# Patient Record
Sex: Male | Born: 1950
Health system: Southern US, Community
[De-identification: ages and names within clinical notes are randomized; demographics above are authoritative.]

## PROBLEM LIST (undated history)

## (undated) DIAGNOSIS — I2510 Atherosclerotic heart disease of native coronary artery without angina pectoris: Secondary | ICD-10-CM

## (undated) DIAGNOSIS — I519 Heart disease, unspecified: Secondary | ICD-10-CM

## (undated) DIAGNOSIS — F419 Anxiety disorder, unspecified: Secondary | ICD-10-CM

## (undated) DIAGNOSIS — K5790 Diverticulosis of intestine, part unspecified, without perforation or abscess without bleeding: Secondary | ICD-10-CM

## (undated) DIAGNOSIS — H9190 Unspecified hearing loss, unspecified ear: Secondary | ICD-10-CM

## (undated) DIAGNOSIS — M1990 Unspecified osteoarthritis, unspecified site: Secondary | ICD-10-CM

## (undated) DIAGNOSIS — I10 Essential (primary) hypertension: Secondary | ICD-10-CM

## (undated) DIAGNOSIS — E785 Hyperlipidemia, unspecified: Secondary | ICD-10-CM

## (undated) DIAGNOSIS — F32A Depression, unspecified: Secondary | ICD-10-CM

## (undated) DIAGNOSIS — E119 Type 2 diabetes mellitus without complications: Secondary | ICD-10-CM

## (undated) DIAGNOSIS — Z860100 Personal history of colon polyps, unspecified: Secondary | ICD-10-CM

## (undated) DIAGNOSIS — T7840XA Allergy, unspecified, initial encounter: Secondary | ICD-10-CM

## (undated) DIAGNOSIS — I219 Acute myocardial infarction, unspecified: Secondary | ICD-10-CM

## (undated) DIAGNOSIS — F329 Major depressive disorder, single episode, unspecified: Secondary | ICD-10-CM

## (undated) HISTORY — DX: Personal history of colonic polyps: Z86.010

## (undated) HISTORY — DX: Major depressive disorder, single episode, unspecified: F32.9

## (undated) HISTORY — DX: Essential (primary) hypertension: I10

## (undated) HISTORY — DX: Diverticulosis of intestine, part unspecified, without perforation or abscess without bleeding: K57.90

## (undated) HISTORY — PX: CORONARY ANGIOPLASTY WITH STENT PLACEMENT: SHX49

## (undated) HISTORY — DX: Personal history of colon polyps, unspecified: Z86.0100

## (undated) HISTORY — DX: Allergy, unspecified, initial encounter: T78.40XA

## (undated) HISTORY — DX: Unspecified osteoarthritis, unspecified site: M19.90

## (undated) HISTORY — DX: Hyperlipidemia, unspecified: E78.5

## (undated) HISTORY — DX: Atherosclerotic heart disease of native coronary artery without angina pectoris: I25.10

## (undated) HISTORY — DX: Heart disease, unspecified: I51.9

## (undated) HISTORY — PX: ANGIOPLASTY: SHX39

## (undated) HISTORY — DX: Depression, unspecified: F32.A

---

## 1984-10-08 HISTORY — PX: ANKLE SURGERY: SHX546

## 2006-02-16 ENCOUNTER — Ambulatory Visit: Payer: Self-pay | Admitting: *Deleted

## 2006-02-16 ENCOUNTER — Inpatient Hospital Stay (HOSPITAL_COMMUNITY): Admission: EM | Admit: 2006-02-16 | Discharge: 2006-02-21 | Payer: Self-pay | Admitting: Emergency Medicine

## 2006-02-16 IMAGING — CR DG CHEST 1V PORT
1 series · 1 of 1 positions shown · non-contrast
Comparison: none

HISTORY: Chest pain, dyspnea

PORTABLE CHEST ONE VIEW:
Normal heart size and mediastinal contours for technique.
Slight vascular congestion.
No gross infiltrate or effusion.
Density right upper lobe probably represents prominent first costochondral
junction.
No pneumothorax.

[view not recorded]
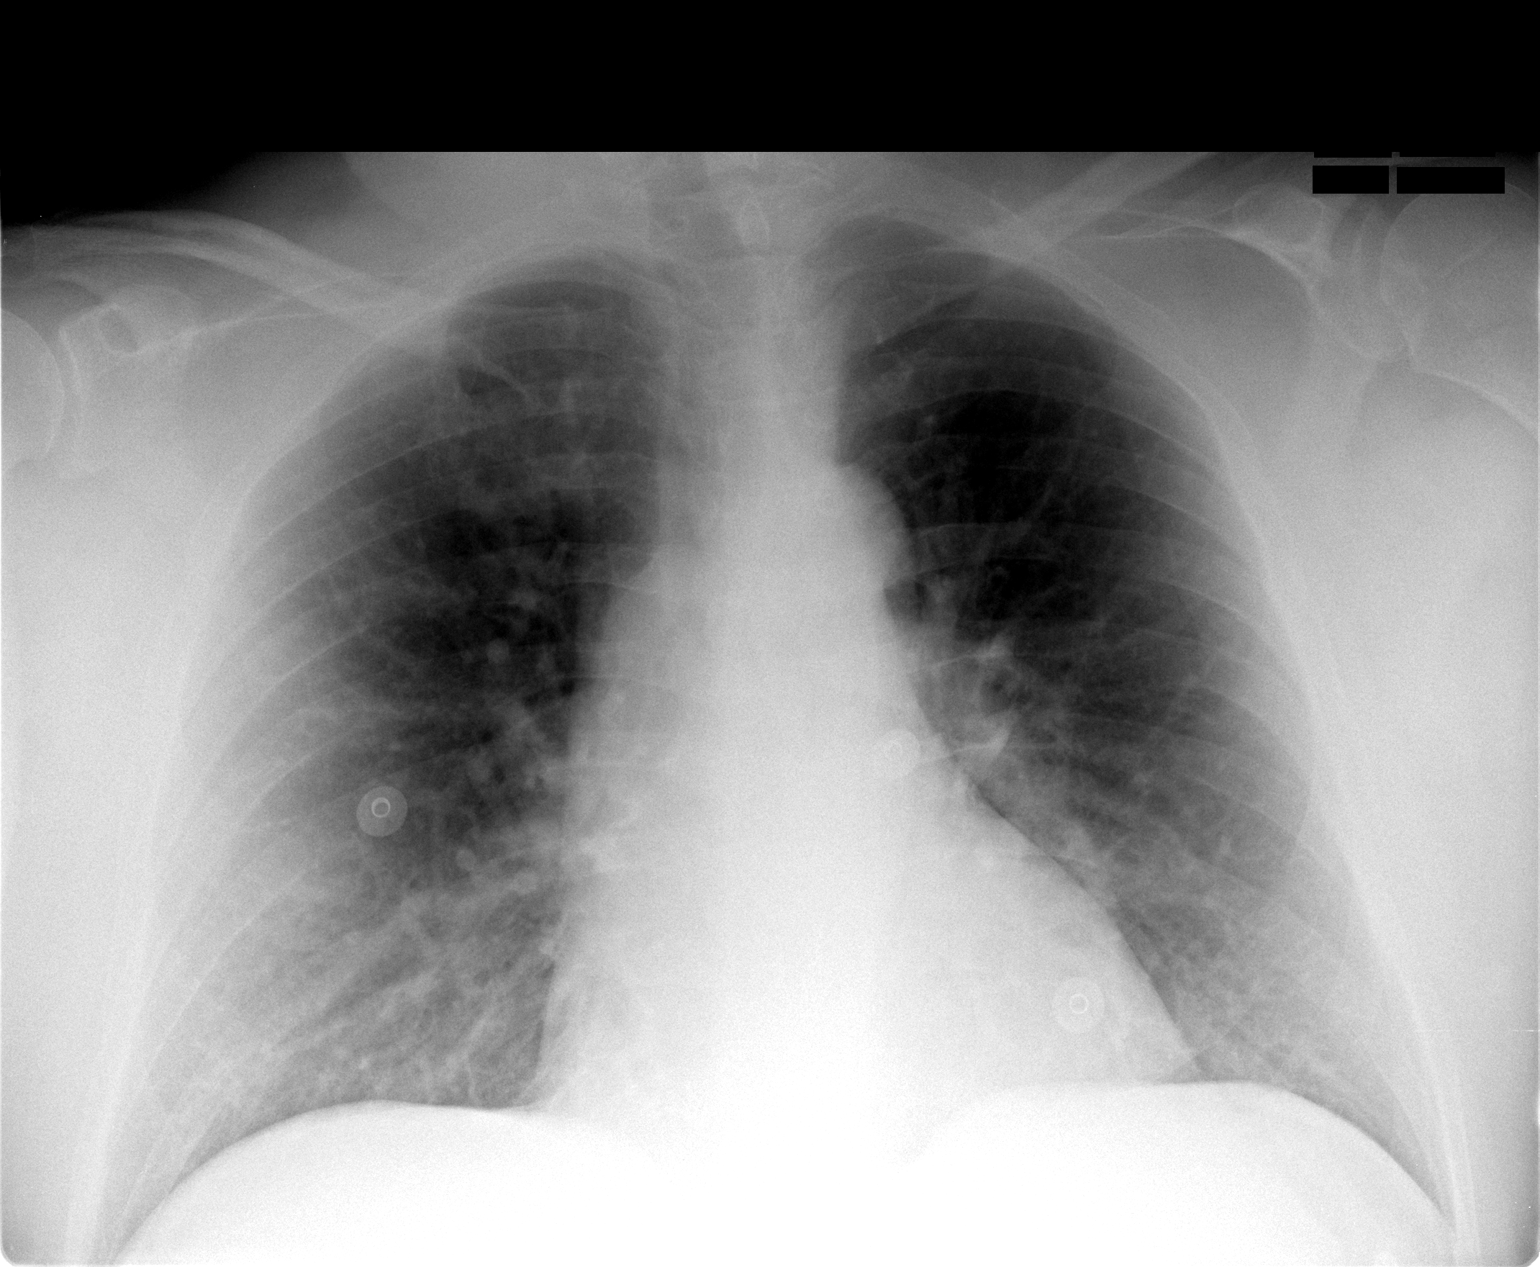

[1 of 1 positions shown; findings below may reference images not displayed]

IMPRESSION: No definite acute abnormalities.
Probable prominent right first costochondral junction accounting for right upper
lobe density, recommend followup upright PA and lateral chest radiographs.

## 2006-02-19 ENCOUNTER — Encounter: Payer: Self-pay | Admitting: Cardiology

## 2006-02-25 ENCOUNTER — Ambulatory Visit: Payer: Self-pay | Admitting: Internal Medicine

## 2006-03-01 ENCOUNTER — Ambulatory Visit: Payer: Self-pay

## 2006-03-01 ENCOUNTER — Ambulatory Visit: Payer: Self-pay | Admitting: Cardiology

## 2006-03-08 ENCOUNTER — Ambulatory Visit: Payer: Self-pay | Admitting: Cardiology

## 2006-03-15 ENCOUNTER — Ambulatory Visit: Payer: Self-pay | Admitting: Cardiology

## 2006-03-19 ENCOUNTER — Ambulatory Visit: Payer: Self-pay | Admitting: Cardiology

## 2006-03-22 ENCOUNTER — Ambulatory Visit: Payer: Self-pay | Admitting: Cardiology

## 2006-04-01 ENCOUNTER — Ambulatory Visit: Payer: Self-pay | Admitting: Cardiology

## 2006-04-08 ENCOUNTER — Ambulatory Visit: Payer: Self-pay | Admitting: Cardiology

## 2006-04-08 ENCOUNTER — Ambulatory Visit: Payer: Self-pay | Admitting: Cardiovascular Disease

## 2006-04-22 ENCOUNTER — Ambulatory Visit: Payer: Self-pay | Admitting: Cardiology

## 2006-05-07 ENCOUNTER — Ambulatory Visit: Payer: Self-pay | Admitting: Cardiology

## 2006-05-22 ENCOUNTER — Ambulatory Visit: Payer: Self-pay | Admitting: *Deleted

## 2006-06-19 ENCOUNTER — Ambulatory Visit: Payer: Self-pay | Admitting: Internal Medicine

## 2006-07-15 ENCOUNTER — Ambulatory Visit: Payer: Self-pay | Admitting: Cardiovascular Disease

## 2006-07-19 ENCOUNTER — Ambulatory Visit: Payer: Self-pay | Admitting: Cardiology

## 2006-08-12 ENCOUNTER — Ambulatory Visit: Payer: Self-pay | Admitting: Internal Medicine

## 2006-08-26 ENCOUNTER — Ambulatory Visit: Payer: Self-pay | Admitting: Cardiology

## 2006-09-02 ENCOUNTER — Ambulatory Visit: Payer: Self-pay

## 2006-09-02 ENCOUNTER — Encounter: Payer: Self-pay | Admitting: Cardiology

## 2006-11-13 ENCOUNTER — Ambulatory Visit: Payer: Self-pay | Admitting: Cardiology

## 2006-11-13 ENCOUNTER — Observation Stay (HOSPITAL_COMMUNITY): Admission: EM | Admit: 2006-11-13 | Discharge: 2006-11-14 | Payer: Self-pay | Admitting: Emergency Medicine
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
## 2006-12-02 ENCOUNTER — Ambulatory Visit: Payer: Self-pay | Admitting: Cardiology

## 2007-01-13 ENCOUNTER — Ambulatory Visit: Payer: Self-pay | Admitting: Gastroenterology

## 2007-02-28 ENCOUNTER — Ambulatory Visit: Payer: Self-pay | Admitting: Gastroenterology

## 2007-02-28 ENCOUNTER — Encounter: Payer: Self-pay | Admitting: Gastroenterology

## 2007-03-10 ENCOUNTER — Ambulatory Visit: Payer: Self-pay | Admitting: Gastroenterology

## 2007-03-10 ENCOUNTER — Ambulatory Visit (HOSPITAL_COMMUNITY): Admission: RE | Admit: 2007-03-10 | Discharge: 2007-03-10 | Payer: Self-pay | Admitting: Gastroenterology
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
## 2007-03-20 ENCOUNTER — Ambulatory Visit: Payer: Self-pay | Admitting: Cardiology

## 2007-06-02 ENCOUNTER — Ambulatory Visit: Payer: Self-pay | Admitting: Cardiology

## 2007-07-14 ENCOUNTER — Ambulatory Visit: Payer: Self-pay | Admitting: Cardiology

## 2007-10-06 ENCOUNTER — Ambulatory Visit: Payer: Self-pay | Admitting: Cardiology

## 2007-11-03 ENCOUNTER — Ambulatory Visit: Payer: Self-pay | Admitting: Cardiology

## 2007-12-29 ENCOUNTER — Emergency Department (HOSPITAL_COMMUNITY): Admission: EM | Admit: 2007-12-29 | Discharge: 2007-12-29 | Payer: Self-pay | Admitting: Emergency Medicine
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
## 2008-02-04 ENCOUNTER — Ambulatory Visit: Payer: Self-pay | Admitting: Cardiology

## 2008-02-24 ENCOUNTER — Ambulatory Visit: Payer: Self-pay | Admitting: Cardiology

## 2008-02-24 LAB — CONVERTED CEMR LAB
BUN: 16 mg/dL (ref 6–23)
CO2: 29 meq/L (ref 19–32)
Calcium: 9.8 mg/dL (ref 8.4–10.5)
Chloride: 103 meq/L (ref 96–112)
Creatinine, Ser: 0.9 mg/dL (ref 0.4–1.5)
GFR calc Af Amer: 112 mL/min
GFR calc non Af Amer: 93 mL/min
Glucose, Bld: 96 mg/dL (ref 70–99)
Potassium: 4.6 meq/L (ref 3.5–5.1)
Sodium: 139 meq/L (ref 135–145)

## 2008-04-12 ENCOUNTER — Ambulatory Visit: Payer: Self-pay | Admitting: Cardiology

## 2008-04-12 ENCOUNTER — Ambulatory Visit: Payer: Self-pay

## 2008-04-12 ENCOUNTER — Encounter: Payer: Self-pay | Admitting: Cardiology

## 2008-04-14 ENCOUNTER — Ambulatory Visit: Payer: Self-pay

## 2008-04-14 ENCOUNTER — Ambulatory Visit: Payer: Self-pay | Admitting: Cardiology

## 2008-04-29 ENCOUNTER — Ambulatory Visit: Payer: Self-pay | Admitting: Cardiology

## 2008-06-07 ENCOUNTER — Ambulatory Visit: Payer: Self-pay | Admitting: Cardiology

## 2008-06-21 ENCOUNTER — Ambulatory Visit: Payer: Self-pay

## 2008-09-20 ENCOUNTER — Ambulatory Visit: Payer: Self-pay | Admitting: Vascular Surgery

## 2008-10-11 ENCOUNTER — Ambulatory Visit: Payer: Self-pay | Admitting: Cardiology

## 2008-11-29 ENCOUNTER — Ambulatory Visit: Payer: Self-pay | Admitting: Cardiology

## 2009-01-31 DIAGNOSIS — I2510 Atherosclerotic heart disease of native coronary artery without angina pectoris: Secondary | ICD-10-CM | POA: Insufficient documentation

## 2009-01-31 DIAGNOSIS — K5730 Diverticulosis of large intestine without perforation or abscess without bleeding: Secondary | ICD-10-CM | POA: Insufficient documentation

## 2009-01-31 DIAGNOSIS — E785 Hyperlipidemia, unspecified: Secondary | ICD-10-CM | POA: Insufficient documentation

## 2009-01-31 DIAGNOSIS — K649 Unspecified hemorrhoids: Secondary | ICD-10-CM | POA: Insufficient documentation

## 2009-01-31 DIAGNOSIS — I10 Essential (primary) hypertension: Secondary | ICD-10-CM | POA: Insufficient documentation

## 2009-01-31 DIAGNOSIS — D126 Benign neoplasm of colon, unspecified: Secondary | ICD-10-CM | POA: Insufficient documentation

## 2009-02-16 ENCOUNTER — Ambulatory Visit: Payer: Self-pay | Admitting: Cardiology

## 2009-03-28 ENCOUNTER — Ambulatory Visit: Payer: Self-pay | Admitting: Cardiology

## 2009-06-14 ENCOUNTER — Ambulatory Visit: Payer: Self-pay | Admitting: Cardiology

## 2009-06-20 ENCOUNTER — Ambulatory Visit: Payer: Self-pay | Admitting: Cardiology

## 2009-06-20 DIAGNOSIS — I2510 Atherosclerotic heart disease of native coronary artery without angina pectoris: Secondary | ICD-10-CM | POA: Insufficient documentation

## 2009-06-21 ENCOUNTER — Telehealth (INDEPENDENT_AMBULATORY_CARE_PROVIDER_SITE_OTHER): Payer: Self-pay

## 2009-06-22 ENCOUNTER — Encounter: Payer: Self-pay | Admitting: Cardiology

## 2009-06-22 ENCOUNTER — Ambulatory Visit: Payer: Self-pay

## 2009-06-24 LAB — CONVERTED CEMR LAB
BUN: 17 mg/dL (ref 6–23)
Basophils Absolute: 0.1 10*3/uL (ref 0.0–0.1)
Basophils Relative: 0.7 % (ref 0.0–3.0)
CO2: 27 meq/L (ref 19–32)
Calcium: 9.4 mg/dL (ref 8.4–10.5)
Chloride: 105 meq/L (ref 96–112)
Creatinine, Ser: 0.9 mg/dL (ref 0.4–1.5)
Eosinophils Absolute: 0.2 10*3/uL (ref 0.0–0.7)
Eosinophils Relative: 2.3 % (ref 0.0–5.0)
GFR calc non Af Amer: 92.2 mL/min (ref >60)
Glucose, Bld: 128 mg/dL — ABNORMAL HIGH (ref 70–99)
HCT: 41.9 % (ref 39.0–52.0)
Hemoglobin: 14.4 g/dL (ref 13.0–17.0)
Lymphocytes Relative: 24.3 % (ref 12.0–46.0)
Lymphs Abs: 2 10*3/uL (ref 0.7–4.0)
MCHC: 34.3 g/dL (ref 30.0–36.0)
MCV: 89.1 fL (ref 78.0–100.0)
Monocytes Absolute: 0.9 10*3/uL (ref 0.1–1.0)
Monocytes Relative: 11 % (ref 3.0–12.0)
Neutro Abs: 4.9 10*3/uL (ref 1.4–7.7)
Neutrophils Relative %: 61.7 % (ref 43.0–77.0)
Platelets: 221 10*3/uL (ref 150.0–400.0)
Potassium: 4.6 meq/L (ref 3.5–5.1)
RBC: 4.71 M/uL (ref 4.22–5.81)
RDW: 13.2 % (ref 11.5–14.6)
Sodium: 138 meq/L (ref 135–145)
WBC: 8.1 10*3/uL (ref 4.5–10.5)

## 2009-07-06 ENCOUNTER — Ambulatory Visit: Payer: Self-pay | Admitting: Cardiology

## 2009-09-06 ENCOUNTER — Ambulatory Visit: Payer: Self-pay | Admitting: Cardiology

## 2009-09-09 ENCOUNTER — Telehealth (INDEPENDENT_AMBULATORY_CARE_PROVIDER_SITE_OTHER): Payer: Self-pay | Admitting: Physician Assistant

## 2009-09-09 ENCOUNTER — Encounter (INDEPENDENT_AMBULATORY_CARE_PROVIDER_SITE_OTHER): Payer: Self-pay | Admitting: *Deleted

## 2009-10-19 ENCOUNTER — Ambulatory Visit: Payer: Self-pay | Admitting: Cardiology

## 2009-12-19 ENCOUNTER — Ambulatory Visit: Payer: Self-pay | Admitting: Cardiology

## 2010-01-02 ENCOUNTER — Encounter: Admission: RE | Admit: 2010-01-02 | Discharge: 2010-01-02 | Payer: Self-pay | Admitting: Internal Medicine
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
## 2010-02-27 ENCOUNTER — Ambulatory Visit: Payer: Self-pay | Admitting: Cardiology

## 2010-03-13 ENCOUNTER — Ambulatory Visit: Payer: Self-pay | Admitting: Cardiology

## 2010-03-13 DIAGNOSIS — R7309 Other abnormal glucose: Secondary | ICD-10-CM | POA: Insufficient documentation

## 2010-03-13 DIAGNOSIS — R609 Edema, unspecified: Secondary | ICD-10-CM | POA: Insufficient documentation

## 2010-03-17 LAB — CONVERTED CEMR LAB
BUN: 16 mg/dL (ref 6–23)
CO2: 32 meq/L (ref 19–32)
Calcium: 9.6 mg/dL (ref 8.4–10.5)
Chloride: 103 meq/L (ref 96–112)
Creatinine, Ser: 1.3 mg/dL (ref 0.4–1.5)
GFR calc non Af Amer: 60.16 mL/min (ref >60)
Glucose, Bld: 185 mg/dL — ABNORMAL HIGH (ref 70–99)
Magnesium: 2.2 mg/dL (ref 1.5–2.5)
Potassium: 4.3 meq/L (ref 3.5–5.1)
Sodium: 141 meq/L (ref 135–145)
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
## 2010-06-19 ENCOUNTER — Encounter: Payer: Self-pay | Admitting: Cardiology

## 2010-06-19 ENCOUNTER — Ambulatory Visit: Payer: Self-pay | Admitting: Cardiology

## 2010-06-19 ENCOUNTER — Ambulatory Visit (HOSPITAL_COMMUNITY): Admission: RE | Admit: 2010-06-19 | Discharge: 2010-06-19 | Payer: Self-pay | Admitting: Cardiology

## 2010-06-19 DIAGNOSIS — R209 Unspecified disturbances of skin sensation: Secondary | ICD-10-CM | POA: Insufficient documentation

## 2010-06-19 IMAGING — CR DG CERVICAL SPINE COMPLETE 4+V
6 series · 6 of 6 positions shown · non-contrast
Comparison: None.

CLINICAL DATA: History of right finger numbness.  No history of
injury.

CERVICAL SPINE - COMPLETE 4+ VIEW

[w c-spine lat]
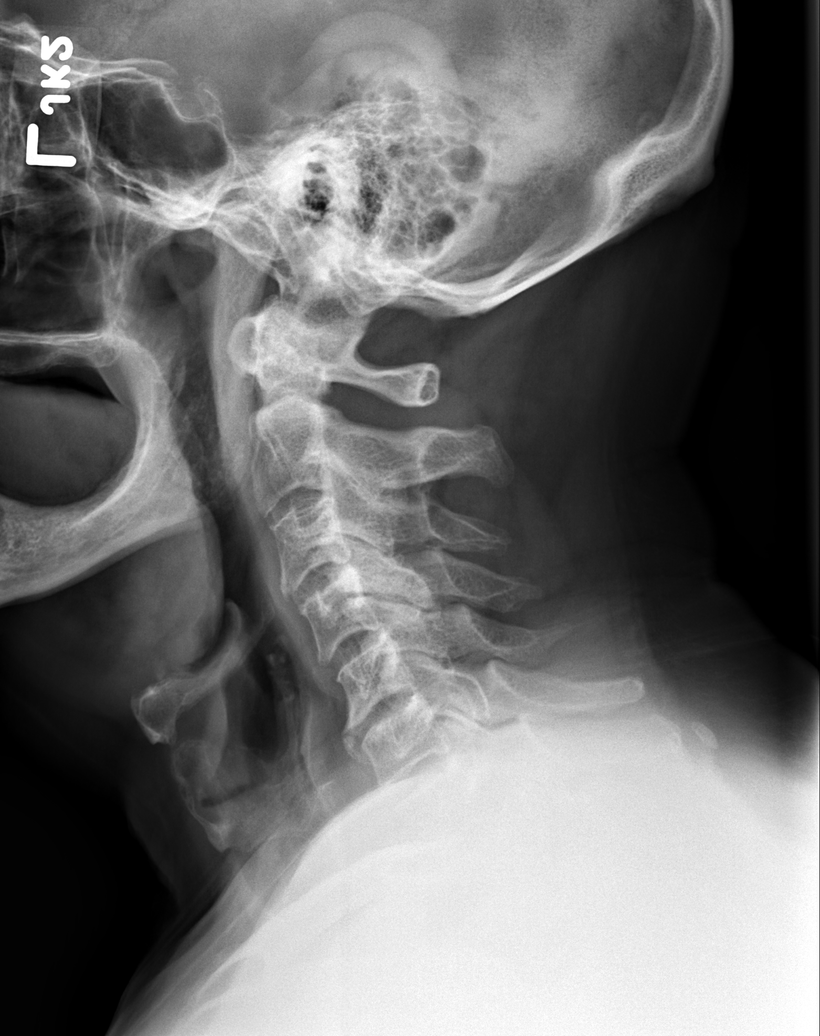

[w swimmers view *]
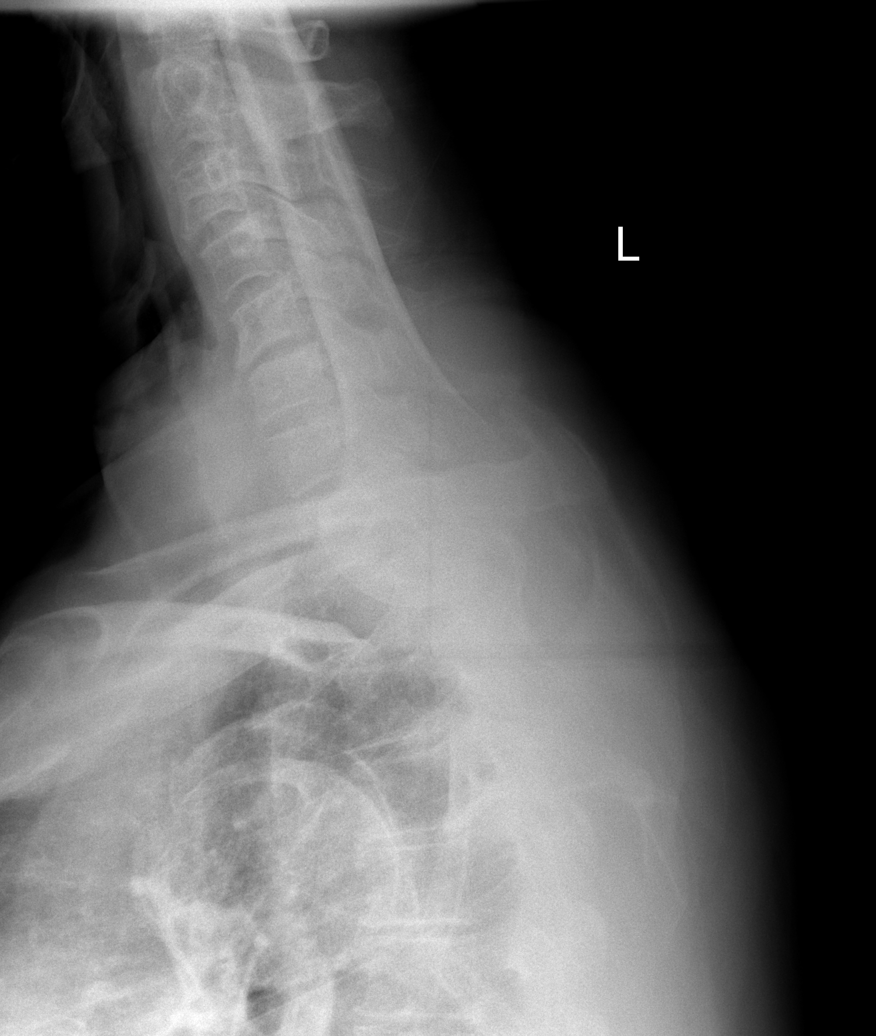

[w c-spine oblique (1 of 2)]
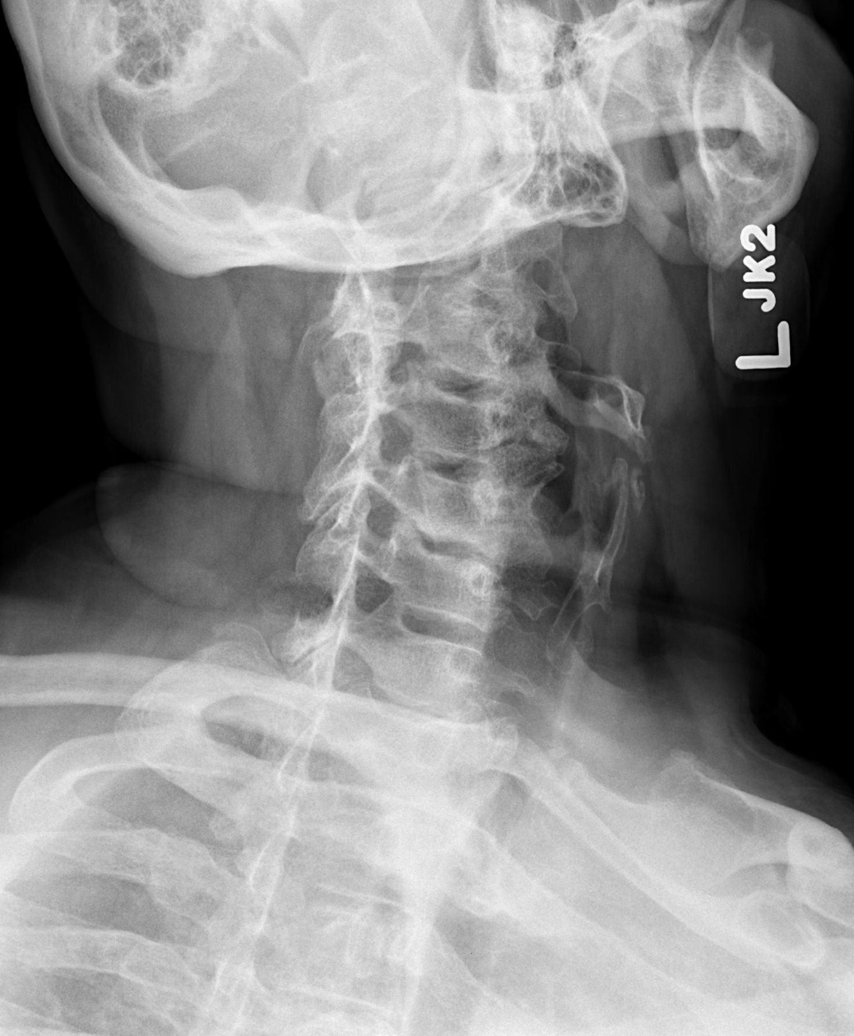

[w c-spine oblique (2 of 2)]
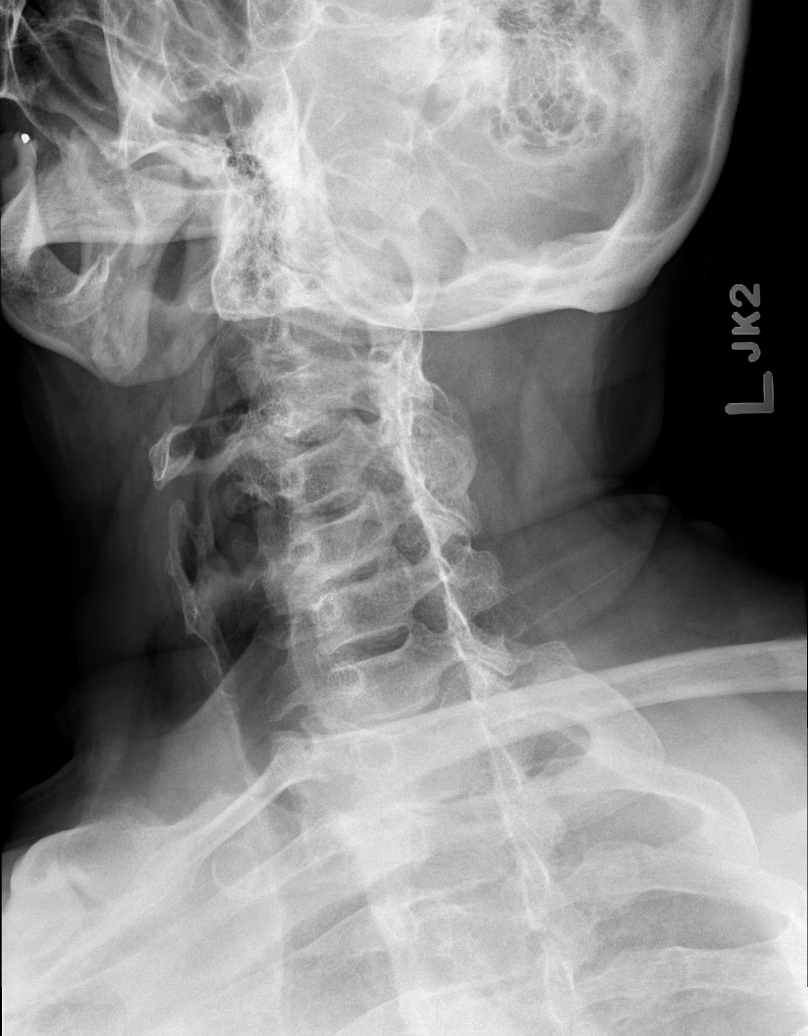

[w c-spine a.p.]
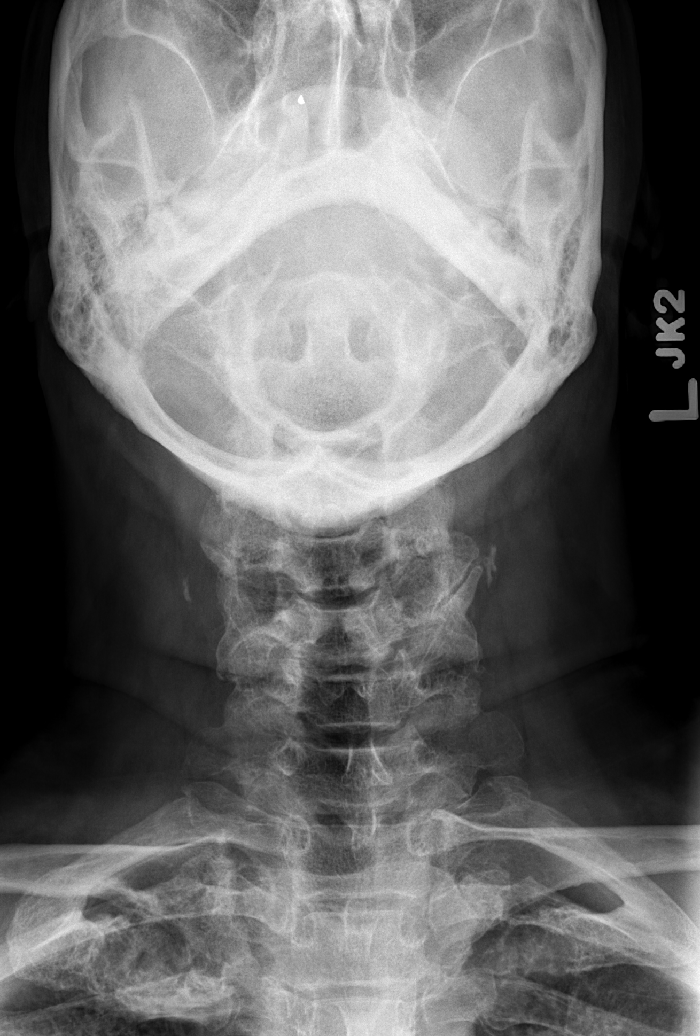

[w c-spine odontoid]
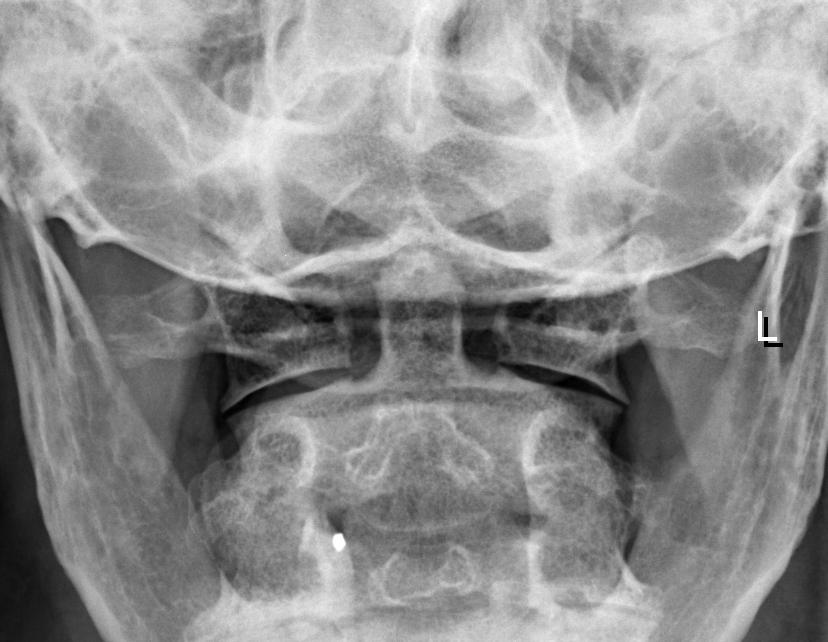

[6 of 6 positions shown; findings below may reference images not displayed]

FINDINGS: There is no evidence of prevertebral soft tissue
swelling.
There is narrowing of the intervertebral disc space at the C5-C6
level.  There is marginal osteophyte formation representing
degenerative spondylosis.  There is nuchal ligament calcification
posterior to the C7 spinous process.  The foramina appear patent.
There is slight spurring with foraminal encroachment at the level
of C3-C4 on the right.  Soft tissue calcifications in the neck
bilaterally may be vascular.  No cervical rib is evident.
IMPRESSION: There is evidence of degenerative disc disease with disc space
narrowing at C5-C6.  There is degenerative spondylosis.  There is
slight foraminal encroachment by spurring at C3-C4 on the right.
Soft tissue calcifications in the neck may be vascular.

## 2010-06-20 LAB — CONVERTED CEMR LAB
BUN: 15 mg/dL (ref 6–23)
Basophils Absolute: 0 10*3/uL (ref 0.0–0.1)
Basophils Relative: 0.4 % (ref 0.0–3.0)
CO2: 32 meq/L (ref 19–32)
Calcium: 9.7 mg/dL (ref 8.4–10.5)
Chloride: 101 meq/L (ref 96–112)
Creatinine, Ser: 1.1 mg/dL (ref 0.4–1.5)
Eosinophils Absolute: 0.2 10*3/uL (ref 0.0–0.7)
Eosinophils Relative: 2.2 % (ref 0.0–5.0)
GFR calc non Af Amer: 75.25 mL/min (ref >60)
Glucose, Bld: 187 mg/dL — ABNORMAL HIGH (ref 70–99)
HCT: 43.2 % (ref 39.0–52.0)
Hemoglobin: 14.5 g/dL (ref 13.0–17.0)
INR: 1 (ref 0.8–1.0)
Lymphocytes Relative: 25.6 % (ref 12.0–46.0)
Lymphs Abs: 2.3 10*3/uL (ref 0.7–4.0)
MCHC: 33.5 g/dL (ref 30.0–36.0)
MCV: 90.1 fL (ref 78.0–100.0)
Monocytes Absolute: 0.8 10*3/uL (ref 0.1–1.0)
Monocytes Relative: 9.1 % (ref 3.0–12.0)
Neutro Abs: 5.6 10*3/uL (ref 1.4–7.7)
Neutrophils Relative %: 62.7 % (ref 43.0–77.0)
Platelets: 269 10*3/uL (ref 150.0–400.0)
Potassium: 5 meq/L (ref 3.5–5.1)
Prothrombin Time: 10.2 s (ref 9.7–11.8)
RBC: 4.79 M/uL (ref 4.22–5.81)
RDW: 14.8 % — ABNORMAL HIGH (ref 11.5–14.6)
Sodium: 142 meq/L (ref 135–145)
WBC: 8.9 10*3/uL (ref 4.5–10.5)
aPTT: 30.4 s — ABNORMAL HIGH (ref 21.7–28.8)

## 2010-06-23 ENCOUNTER — Ambulatory Visit: Payer: Self-pay | Admitting: Cardiology

## 2010-06-23 ENCOUNTER — Inpatient Hospital Stay (HOSPITAL_BASED_OUTPATIENT_CLINIC_OR_DEPARTMENT_OTHER): Admission: RE | Admit: 2010-06-23 | Discharge: 2010-06-23 | Payer: Self-pay | Admitting: Cardiology

## 2010-06-26 ENCOUNTER — Encounter: Payer: Self-pay | Admitting: Cardiology

## 2010-06-26 ENCOUNTER — Telehealth: Payer: Self-pay | Admitting: Cardiology

## 2010-07-04 ENCOUNTER — Telehealth (INDEPENDENT_AMBULATORY_CARE_PROVIDER_SITE_OTHER): Payer: Self-pay | Admitting: *Deleted

## 2010-07-11 ENCOUNTER — Telehealth (INDEPENDENT_AMBULATORY_CARE_PROVIDER_SITE_OTHER): Payer: Self-pay | Admitting: *Deleted

## 2010-07-25 ENCOUNTER — Ambulatory Visit: Payer: Self-pay | Admitting: Cardiology

## 2010-07-27 ENCOUNTER — Encounter: Payer: Self-pay | Admitting: Cardiology

## 2010-09-01 ENCOUNTER — Inpatient Hospital Stay (HOSPITAL_COMMUNITY): Admission: EM | Admit: 2010-09-01 | Discharge: 2010-09-03 | Payer: Self-pay | Admitting: Emergency Medicine

## 2010-09-01 ENCOUNTER — Ambulatory Visit: Payer: Self-pay | Admitting: Cardiology
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
## 2010-10-16 ENCOUNTER — Ambulatory Visit
Admission: RE | Admit: 2010-10-16 | Discharge: 2010-10-16 | Payer: Self-pay | Source: Home / Self Care | Attending: Cardiology | Admitting: Cardiology

## 2010-11-07 NOTE — Progress Notes (Signed)
  Walk in Patient Form Recieved " Pt needs Healthcare Provider forms completed' sent to [facility]  July 04, 2010 4:11 PM     Appended Document:  Received request for document from [employer] to be completed. Request forwarded to [employer].

## 2010-11-07 NOTE — Procedures (Signed)
Summary: Gastroenterology COLON  Gastroenterology COLON   Imported By: Thereasa Solo 03/20/2008 12:24:22  _____________________________________________________________________  External Attachment:    Type:   Image     Comment:   External Document

## 2010-11-07 NOTE — Cardiovascular Report (Signed)
Summary: Pre-Cath Orders  Pre-Cath Orders   Imported By: Marylou Mccoy 07/04/2010 11:24:50  _____________________________________________________________________  External Attachment:    Type:   Image     Comment:   External Document

## 2010-11-07 NOTE — Assessment & Plan Note (Signed)
Summary: Cardiology Nuclear Study  Nuclear Med Background Indications for Stress Test: Evaluation for Ischemia, Stent Patency, PTCA Patency   History: Angioplasty, Echo, GXT, Heart Catheterization, Myocardial Infarction, Myocardial Perfusion Study, Stents  History Comments: 5/07: MI>PTCA/Stent-LAD; 11/07 MPS: Apical scar, no ischemia; '08 GXT: No significant ST changes, unable to achieve [facility] 7/09 Echo: EF=65%  Symptoms: Chest Pain, Chest Pain with Exertion, Chest Pressure, Chest Pressure with Exertion, DOE, Fatigue, Rapid HR    Nuclear Pre-Procedure Cardiac Risk Factors: Family History - CAD, History of Smoking, Hypertension, Lipids, Obesity Caffeine/Decaff Intake: none NPO After: 10:30 PM Lungs: Clear IV 0.9% NS with Angio Cath: 20g     IV Site: (R) AC IV Started by: Stanton Kidney EMT-P Chest Size (in) 50     Height (in): 69 Weight (lb): 280 BMI: 41.50  Nuclear Med Study 1 or 2 day study:  1 day     Stress Test Type:  Adenosine Reading MD:  Olga Millers, MD     Referring MD:  Shawnie Pons, MD Resting Radionuclide:  Technetium 28m Tetrofosmin     Resting Radionuclide Dose:  11 mCi  Stress Radionuclide:  Technetium 45m Tetrofosmin     Stress Radionuclide Dose:  33 mCi   Stress Protocol      Max HR:  81 bpm Max Systolic BP: 82 mm HgRate Pressure Product:  6642 Dose of Adenosine:  60.0 mg    Stress Test Technologist:  Rea [employer] CMA-N     Nuclear Technologist:  Domenic Polite CNMT  Rest Procedure  Myocardial perfusion imaging was performed at rest 45 minutes following the intraveneous administration of Myoview Technetium 36m Tetrofosmin.  Stress Procedure  The patient received IV adenosine at 140 mcg/kg/min for 4 minutes. There was a brief episode of 2nd degree AVB with infusion. He did c/o chest pressure.  Myoview was injected at the 2 minute mark and quantitative spect images were obtained after a 45 minute delay.  QPS Raw Data Images:  There is no interference from  other nuclear activity. Stress Images:  There is decreased uptake in the inferior wall and apex. Rest Images:  There is decreased uptake in the inferior wall and apex; inferior defect slightly less prominent compared to the stress images. Subtraction (SDS):  These findings are consistent with prior inferior and apical infarct; minimal peri-infarct ischemia in the inferior wall. Transient Ischemic Dilatation:  1.12  (Normal <1.22)  Lung/Heart Ratio:  .42  (Normal <0.45)  Quantitative Gated Spect Images QGS EDV:  165 ml QGS ESV:  95 ml QGS EF:  42 % QGS cine images:  Apical akinesis; evidence of LVE. A   Overall Impression  Exercise Capacity: Adenosine study with no exercise. ECG Impression: No significant ST segment change suggestive of ischemia. Overall Impression: Abnormal adenosine nuclear study with prior inferior and apical infarct; minimal peri-infarct ischemia in the inferior wall.  Appended Document: Cardiology Nuclear Study Study reviewed in detail.  Apex and inferior findings, with overall small ischemia.  I left message on machine, but he will need a followup with me next week to review.  I discussed on phone possible repeat cath study.  Appended Document: Cardiology Nuclear Study Pt aware of results by phone.  Pt is scheduled to see Dr Riley Kill 07/06/09.

## 2010-11-07 NOTE — Letter (Signed)
Summary: Cardiac Catheterization Instructions- [facility]  [facility], Main Office  [address]   [place], [place] [address]   Phone: [phone]  Fax: [phone]     06/19/2010 MRN: [id number]  [facility] [address] [place], [place]  [address]  Dear Mr. Socorro,   You are scheduled for a Cardiac Catheterization on Friday September 16 with Dr. Riley Kill  Please arrive to the 1st floor of the Heart and Vascular Center at [facility] at 9:30 am on the day of your procedure. Please do not arrive before 6:30 a.m. Call the Heart and Vascular Center at [phone] if you are unable to make your appointmnet. The Code to get into the parking garage under the building is 0020. Take the elevators to the 1st floor. You must have someone to drive you home. Someone must be with you for the first 24 hours after you arrive home. Please wear clothes that are easy to get on and off and wear slip-on shoes. Do not eat or drink after midnight except water with your medications that morning. Bring all your medications and current insurance cards with you.   _X_ Make sure you take your aspirin.  __X_ You may take ALL of your medications with water that morning.   The usual length of stay after your procedure is 2 to 3 hours. This can vary.  If you have any questions, please call the office at the number listed above.  Dr. Maisie Fus Stuckey/ Lisabeth Devoid RN

## 2010-11-07 NOTE — Progress Notes (Signed)
  Phone Note Call from Patient   Reason for Call: Refill Medication Summary of Call: Vincent [name] has lost his nitro. The pharmacy would not refill the med. I called in Rx for nitro for him. He is not having chest pain now.  Initial call taken by: Park Breed PA-C,  September 09, 2009 5:18 PM     Appended Document:  reviewed.  And agree
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
## 2010-11-07 NOTE — Assessment & Plan Note (Signed)
Summary: EPH   Visit Type:  Follow-up Primary Provider:  shaw  CC:  Post-hospital.  History of Present Illness: Vincent [name] comes in today to review his heart cath results.  He is stable.  The procedure was performed from the radial approach, and he felt a little bit of numbness, but that all resolved.  We discussed his findings in detail, and I also spent alot of time filling out his forms so that would have these promptly which he needs for work.  His anatomy was reviewed in detail so he could see what he was looking at.  Current Medications (verified): 1)  Ramipril 2.5 Mg Caps (Ramipril) .... Take 1 Capsule By Mouth Once A Day 2)  Study Drug Improve .... As Directed 3)  Aspirin 81 Mg Tbec (Aspirin) .... Take One Tablet By Mouth Daily 4)  Metoprolol Tartrate 50 Mg Tabs (Metoprolol Tartrate) .... Take One Tablet By Mouth Twice A Day 5)  Nitroglycerin 0.4 Mg/hr Pt24 (Nitroglycerin) .... As Directed For Chest Pain 6)  Xanax Xr 0.5 Mg Xr24h-Tab (Alprazolam) .... As Needed 7)  Furosemide 40 Mg Tabs (Furosemide) .... Take 1 Tablet By Mouth Two Times A Day  Allergies: 1)  ! Penicillin  Past History:  Past Medical History: Last updated: 01/31/2009 CAD, UNSPECIFIED SITE (ICD-414.00) HYPERTENSION, UNSPECIFIED (ICD-401.9) HYPERLIPIDEMIA-MIXED (ICD-272.4) HEMORRHOIDS (ICD-455.6) DIVERTICULOSIS, MILD (ICD-562.10) COLONIC POLYPS, ADENOMATOUS (ICD-211.3) Hx of (L) ventricular dysfunction Allergies  Past Surgical History: Last updated: 03/19/2008 Status post angioplasty and bare metal stent placement  Family History: Last updated: 01/31/2009 Family History of Coronary Artery Disease:   Social History: Last updated: 01/31/2009 Full Time Married  Tobacco Use - No.  Alcohol Use - no  Vital Signs:  Patient profile:   60 year old male Height:      69 inches Weight:      275.50 pounds Pulse rate:   76 / minute Pulse rhythm:   regular Resp:     20 per minute BP sitting:   124 / 80   (left arm) Cuff size:   large  Vitals Entered By: Vikki Ports (July 25, 2010 3:25 PM)  Physical Exam  General:  Well developed, well nourished, in no acute distress. Head:  normocephalic and atraumatic Eyes:  PERRLA/EOM intact; conjunctiva and lids normal. Lungs:  Clear bilaterally to auscultation and percussion. Heart:  PMi non displaced. Normal S1 and S2.  No murmur, rub, or gallop.   Abdomen:  Obese Pulses:  pulses normal in all 4 extremities Extremities:  Radial artery looks normal.  Neurologic:  Alert and oriented x 3.   EKG  Procedure date:  06/24/2010  Findings:      ANGIOGRAPHIC DATA: 1. Ventriculography done in the RAO projection reveals hypokinesis of     the apex.  Overall ejection fraction estimated to be in the range     of 45-50%. 2. The left main is free of disease. 3. The left anterior descending artery has 30-40% segmental narrowing     in the previously placed stent site, however, high-grade in-stent     restenosis is not noted.  There is perhaps maybe 30-40% mid     narrowing at the diagonal, but no critical lesions noted. 4. The circumflex has multiple small marginals, then provides a large     bifurcating marginal.  There is perhaps 40% mid narrowing, but it     does not appear to be flow-limiting. 5. The right coronary artery is a large-caliber vessel with an acute  marginal, posterior descending, and posterolateral system.  No     areas of high-grade stenosis are noted.   CONCLUSION: 1. Mild reduction in left ventricular function with anteroapical wall     motion abnormality. 2. Continued patency of the stent to the LAD. 3. No areas of high-grade disease involving the other coronary     vasculature.   DISPOSITION:  The patient will be treated medically.  He will follow up with me in the office.         Arturo Morton. Riley Kill, MD, [facility]      EKG  Procedure date:  06/24/2010  Findings:      Non specific T wave  abnormality.  Impression & Recommendations:  Problem # 1:  CAD, UNSPECIFIED SITE (ICD-414.00) See cath report.  Findings reviewed. Patient encouraged about findings.   His updated medication list for this problem includes:    Ramipril 2.5 Mg Caps (Ramipril) .Marland Kitchen... Take 1 capsule by mouth once a day    Aspirin 81 Mg Tbec (Aspirin) .Marland Kitchen... Take one tablet by mouth daily    Metoprolol Tartrate 50 Mg Tabs (Metoprolol tartrate) .Marland Kitchen... Take one tablet by mouth twice a day    Nitroglycerin 0.4 Mg/hr Pt24 (Nitroglycerin) .Marland Kitchen... As directed for chest pain  Orders: EKG w/ Interpretation (93000)  Problem # 2:  HYPERLIPIDEMIA-MIXED (ICD-272.4) The patient remains in the IMPROVE IT  ([facility]) study protocol for hyperlipidemia.   LIpids are blinded.  Problem # 3:  FASTING HYPERGLYCEMIA (ICD-790.29) Remains an issue.   Patient Instructions: 1)  Your physician recommends that you continue on your current medications as directed. Please refer to the Current Medication list given to you today. 2)  Your physician wants you to follow-up in: 6 MONTHS.   You will receive a reminder letter in the mail two months in advance. If you don't receive a letter, please call our office to schedule the follow-up appointment.  Appended Document: EPH Can you let him know his sugar is elevated, and we want him to follow up with Dr. Clelia Croft on this.  TS  I spoke with the pt and made him aware that he needs further follow-up of his glucose.  The pt said he would discuss this with Dr Clelia Croft. I spoke with the pt about decreasing the sugar intake (carbohydrates) in his diet.   Appended Document: [facility] course reviewed.  TS

## 2010-11-07 NOTE — Assessment & Plan Note (Signed)
Summary: ROV   Visit Type:  Follow-up Primary Provider:  shaw  CC:  Some chest pains and Sob.  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  Patient not smoking.  He thinks that is too much.  Now on furosemide twice daily for his lower extremity edema.  He does fine at home.  Says he does not have sleep apnea.  He says he is now tired all of the time.  Is sleepy alot.  Works 6pm to [employer].    Current Medications (verified): 1)  Ramipril 2.5 Mg Caps (Ramipril) .... Take 1 Capsule By Mouth Once A Day 2)  Study Drug Improve .... As Directed 3)  Aspirin 81 Mg Tbec (Aspirin) .... Take One Tablet By Mouth Daily 4)  Metoprolol Tartrate 50 Mg Tabs (Metoprolol Tartrate) .... Take One Tablet By Mouth Twice A Day 5)  Nitroglycerin 0.4 Mg/hr Pt24 (Nitroglycerin) .... As Directed For Chest Pain 6)  Xanax Xr 0.5 Mg Xr24h-Tab (Alprazolam) .... As Needed 7)  Furosemide 40 Mg Tabs (Furosemide) .... Take 1 Tablet By Mouth Two Times A Day  Allergies: 1)  ! Penicillin  Vital Signs:  Patient profile:   60 year old male Height:      69 inches Weight:      285 pounds BMI:     42.24 Pulse rate:   84 / minute Pulse rhythm:   regular Resp:     18 per minute BP sitting:   116 / 70  (left arm) Cuff size:   large  Vitals Entered By: Vikki Ports (March 13, 2010 10:33 AM)  Physical Exam  General:  Well developed, well nourished, in no acute distress.  Beard Head:  normocephalic and atraumatic Eyes:  PERRLA/EOM intact; conjunctiva and lids normal. Lungs:  Clear bilaterally to auscultation and percussion. Heart:  PMI non displaced.  Normal S1 and S2.  Pos S4.   Extremities:  No clubbing or cyanosis.  Two plus edema bilaterally Neurologic:  Alert and oriented x 3.   EKG  Procedure date:  03/13/2010  Findings:      NSR.  DIffuse T flattening.  Impression & Recommendations:  Problem # 1:  CORONARY ATHEROSCLEROSIS NATIVE CORONARY ARTERY  (ICD-414.01)  Has non predictable pain.  Sometimes with very high work load.  Sometimes when anxious.  No predictable pattern.    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  Will hold off on cath at present time.  See nuc report. His updated medication list for this problem includes:    Ramipril 2.5 Mg Caps (Ramipril) .Marland Kitchen... Take 1 capsule by mouth once a day    Aspirin 81 Mg Tbec (Aspirin) .Marland Kitchen... Take one tablet by mouth daily    Metoprolol Tartrate 50 Mg Tabs (Metoprolol tartrate) .Marland Kitchen... Take one tablet by mouth twice a day    Nitroglycerin 0.4 Mg/hr Pt24 (Nitroglycerin) .Marland Kitchen... As directed for chest pain  Orders: TLB-BMP (Basic Metabolic Panel-BMET) (80048-METABOL) TLB-Magnesium (Mg) (83735-MG) EKG w/ Interpretation (93000)  Problem # 2:  HYPERTENSION, UNSPECIFIED (ICD-401.9)  controlled. His updated medication list for this problem includes:    Ramipril 2.5 Mg Caps (Ramipril) .Marland Kitchen... Take 1 capsule by mouth once a day    Aspirin 81 Mg Tbec (Aspirin) .Marland Kitchen... Take one tablet by mouth daily    Metoprolol Tartrate 50 Mg Tabs (Metoprolol tartrate) .Marland Kitchen... Take one tablet by mouth twice a day  Furosemide 40 Mg Tabs (Furosemide) .Marland Kitchen... Take 1 tablet by mouth two times a day  Orders: TLB-BMP (Basic Metabolic Panel-BMET) (80048-METABOL) TLB-Magnesium (Mg) (83735-MG) EKG w/ Interpretation (93000)  Problem # 3:  EDEMA (ICD-782.3)  On furosemide.  Need to check K and MG.  Will send to Dr. Clelia Croft when available.    Orders: TLB-BMP (Basic Metabolic Panel-BMET) (80048-METABOL) TLB-Magnesium (Mg) (83735-MG) EKG w/ Interpretation (93000)  Problem # 4:  HYPERLIPIDEMIA-MIXED (ICD-272.4)  In the IMPROVE IT study at [facility].   Orders: TLB-BMP (Basic Metabolic Panel-BMET) (80048-METABOL) TLB-Magnesium (Mg) (83735-MG) EKG w/ Interpretation (93000)  Problem # 5:  FASTING HYPERGLYCEMIA (ICD-790.29) discussed elevated sugar from research labs.  Glucose 157 on fasting  specimen.  Will forward to Dr. Clelia Croft.   Patient Instructions: 1)  Your physician recommends that you continue on your current medications as directed. Please refer to the Current Medication list given to you today. 2)  Your physician recommends that you have lab work today: BMP and Magnesium 3)  Your physician recommends that you schedule a follow-up appointment in: 3 MONTHS

## 2010-11-07 NOTE — Assessment & Plan Note (Signed)
Summary: f89m   Visit Type:  3 mo f/u Primary Provider:  shaw  CC:  chest pain...sob....denies edema.  History of Present Illness: He says the pain feels like when you swallow something and you cannot get it down.  He also gets numbness through the right hand, in various distributions.  He will get chest pain when he does heavy lifting at work.  The pain has not gotten worse, but it can be a hard pain.    Current Medications (verified): 1)  Ramipril 2.5 Mg Caps (Ramipril) .... Take 1 Capsule By Mouth Once A Day 2)  Study Drug Improve .... As Directed 3)  Aspirin 81 Mg Tbec (Aspirin) .... Take One Tablet By Mouth Daily 4)  Metoprolol Tartrate 50 Mg Tabs (Metoprolol Tartrate) .... Take One Tablet By Mouth Twice A Day 5)  Nitroglycerin 0.4 Mg/hr Pt24 (Nitroglycerin) .... As Directed For Chest Pain 6)  Xanax Xr 0.5 Mg Xr24h-Tab (Alprazolam) .... As Needed 7)  Furosemide 40 Mg Tabs (Furosemide) .... Take 1 Tablet By Mouth Two Times A Day  Allergies: 1)  ! Penicillin  Past History:  Past Medical History: Last updated: 01/31/2009 CAD, UNSPECIFIED SITE (ICD-414.00) HYPERTENSION, UNSPECIFIED (ICD-401.9) HYPERLIPIDEMIA-MIXED (ICD-272.4) HEMORRHOIDS (ICD-455.6) DIVERTICULOSIS, MILD (ICD-562.10) COLONIC POLYPS, ADENOMATOUS (ICD-211.3) Hx of (L) ventricular dysfunction Allergies  Past Surgical History: Last updated: 03/19/2008 Status post angioplasty and bare metal stent placement  Family History: Last updated: 01/31/2009 Family History of Coronary Artery Disease:   Social History: Last updated: 01/31/2009 Full Time Married  Tobacco Use - No.  Alcohol Use - no  Vital Signs:  Patient profile:   60 year old male Height:      69 inches Weight:      278.8 pounds BMI:     41.32 Pulse rate:   74 / minute Pulse rhythm:   irregular BP sitting:   122 / 70  (left arm) Cuff size:   large  Vitals Entered By: Danielle Rankin, CMA (June 19, 2010 11:10 AM)  Physical  Exam  General:  Well developed, well nourished, in no acute distress. Head:  normocephalic and atraumatic Eyes:  PERRLA/EOM intact; conjunctiva and lids normal. Ears:  TM's intact and clear with normal canals and hearing Lungs:  Clear bilaterally to auscultation and percussion. Heart:  PMI non displaced.  Normal S1 and S2.   Abdomen:  Obese.  No obvious masses.  Pulses:  pulses normal in all 4 extremities Extremities:  No clubbing or cyanosis. Neurologic:  Alert and oriented x 3.  No definite weakness bilaterally.    Cardiac Cath  Procedure date:  02/16/2006  Findings:      ANGIOGRAPHIC DATA:  1.  Ventriculography was done in the RAO projection. There was hypokinesis      of the mid anterolateral wall and what appears to be akinesis to even      dyskinesis of the apex and distal inferior segment. We estimated the      ejection fraction at 40%. Contrast ventriculography was not calculated      because of incomplete dye opacification.  2.  The left main coronary artery is free of critical disease.  3.  The LAD is totally occluded. Following re-perfusion, the total occlusion      was reduced from 100% down to 0% with an excellent angiographic result.      The distal LAD had some mild luminal irregularity with perhaps 30% after      the major diagonal.  4.  The circumflex is  a moderate size vessel with about 30% proximal      narrowing.  5.  The right coronary artery is a large caliber vessel providing posterior      descending and posterolateral branch with about 30% narrowing.   CONCLUSION:  1.  Moderate wall motion abnormality involving the left anterior descending      territory.  2.  Total occlusion of the proximal left anterior descending with successful      re-perfusion using direct primary angioplasty.  3.  Other scattered luminal irregularities as noted above.   DISPOSITION:  The patient will be treated medically. He will need aggressive  risk factor  reduction.   EKG  Procedure date:  06/19/2010  Findings:      NSR.  Low voltage QRS.    Impression & Recommendations:  Problem # 1:  CORONARY ATHEROSCLEROSIS NATIVE CORONARY ARTERY (ICD-414.01) Has recurrent chest pain.  It occurs at all times.  Would favor diagnostic cath, as given size doubt nuclear study would be reliable.  Need to separate given type and extent of work.  would also favor cervical spine films.  His updated medication list for this problem includes:    Ramipril 2.5 Mg Caps (Ramipril) .Marland Kitchen... Take 1 capsule by mouth once a day    Aspirin 81 Mg Tbec (Aspirin) .Marland Kitchen... Take one tablet by mouth daily    Metoprolol Tartrate 50 Mg Tabs (Metoprolol tartrate) .Marland Kitchen... Take one tablet by mouth twice a day    Nitroglycerin 0.4 Mg/hr Pt24 (Nitroglycerin) .Marland Kitchen... As directed for chest pain  Problem # 2:  HYPERLIPIDEMIA-MIXED (ICD-272.4)  on Improve it study drug.  Orders: EKG w/ Interpretation (93000) Cardiac Catheterization (Cardiac Cath) TLB-BMP (Basic Metabolic Panel-BMET) (80048-METABOL) TLB-CBC Platelet - w/Differential (85025-CBCD) TLB-PT (Protime) (85610-PTP) TLB-PTT (85730-PTTL)  Problem # 3:  ARM NUMBNESS (ICD-782.0) R arm numbness.  Not sure if he is having compression.  Will need cervical spines.    Patient Instructions: 1)  Your physician has requested that you have a cardiac catheterization.  Cardiac catheterization is used to diagnose and/or treat various heart conditions. Doctors may recommend this procedure for a number of different reasons. The most common reason is to evaluate chest pain. Chest pain can be a symptom of coronary artery disease (CAD), and cardiac catheterization can show whether plaque is narrowing or blocking your heart's arteries. This procedure is also used to evaluate the valves, as well as measure the blood flow and oxygen levels in different parts of your heart.  For further information please visit [email or web address]  Please follow  instruction sheet, as given. 2)  Your physician recommends that you have  lab work today: cbc, bmet, pt, ptt 3)  Your physician recommends that you continue on your current medications as directed. Please refer to the Current Medication list given to you today.

## 2010-11-07 NOTE — Assessment & Plan Note (Signed)
Summary: Vincent [name]   Visit Type:  Chest pain  CC:  Chest pains and Sob.  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  Rolls weigh up from 20 to 120 pounds.  He cannot pick them up.  Occasionally will get some in chest from time to time.   Still concerned.  Wants to hold off on any cath until vacation in two weeks.  See last note.  He wanted to put off, but still wants to have it done.  Last cath was 2007.  Pt agreeble.    Current Medications (verified): 1)  Ramipril 2.5 Mg Caps (Ramipril) .... Take 1 Capsule By Mouth Once A Day 2)  Study Drug Improve .... As Directed 3)  Aspirin 81 Mg Tbec (Aspirin) .... Take One Tablet By Mouth Daily 4)  Metoprolol Tartrate 50 Mg Tabs (Metoprolol Tartrate) .... Take One Tablet By Mouth Twice A Day 5)  Nitroglycerin 0.4 Mg/hr Pt24 (Nitroglycerin) .... As Directed For Chest Pain 6)  Xanax Xr 0.5 Mg Xr24h-Tab (Alprazolam) .... As Needed  Allergies: 1)  ! Penicillin  Vital Signs:  Patient profile:   60 year old male Height:      69 inches Weight:      289.25 pounds BMI:     42.87 Pulse rate:   73 / minute Pulse rhythm:   regular Resp:     18 per minute BP sitting:   120 / 76  (left arm) Cuff size:   large  Vitals Entered By: Vikki Ports (December 19, 2009 9:16 AM)  Physical Exam  General:  Well developed, well nourished, in no acute distress. Head:  normocephalic and atraumatic Lungs:  Clear bilaterally to auscultation and percussion. Heart:  PMI non displaced.  Nomal S1 and S2. No murmur or rub.   Abdomen:  obese without masses. Pulses:  pulses normal in all 4 extremities Extremities:  Mild one plus edema.   Neurologic:  Alert and oriented x 3.   EKG  Procedure date:  12/19/2009  Findings:      NSR.  Nonspecific ST abnormality.  Impression & Recommendations:  Problem # 1:  CORONARY ATHEROSCLEROSIS NATIVE CORONARY ARTERY (ICD-414.01)  Discussed again cath.  Need for occupational purposes.  Could do from  radial approach.  He should call us if any change in status. His updated medication list for this problem includes:    Ramipril 2.5 Mg Caps (Ramipril) .Marland Kitchen... Take 1 capsule by mouth once a day    Aspirin 81 Mg Tbec (Aspirin) .Marland Kitchen... Take one tablet by mouth daily    Metoprolol Tartrate 50 Mg Tabs (Metoprolol tartrate) .Marland Kitchen... Take one tablet by mouth twice a day    Nitroglycerin 0.4 Mg/hr Pt24 (Nitroglycerin) .Marland Kitchen... As directed for chest pain  Orders: EKG w/ Interpretation (93000)  Problem # 2:  HYPERTENSION, UNSPECIFIED (ICD-401.9)  Controlled. His updated medication list for this problem includes:    Ramipril 2.5 Mg Caps (Ramipril) .Marland Kitchen... Take 1 capsule by mouth once a day    Aspirin 81 Mg Tbec (Aspirin) .Marland Kitchen... Take one tablet by mouth daily    Metoprolol Tartrate 50 Mg Tabs (Metoprolol tartrate) .Marland Kitchen... Take one tablet by mouth twice a day  Orders: EKG w/ Interpretation (93000)  Problem # 3:  HYPERLIPIDEMIA-MIXED (ICD-272.4)  Improve it protocol.    Orders: EKG w/ Interpretation (93000)  Patient Instructions: 1)  Your physician recommends that you schedule a follow-up appointment in: 2 MONTHS 2)  Your physician recommends that you  continue on your current medications as directed. Please refer to the Current Medication list given to you today. 3)  Please call the office at [phone] to schedule cardiac catheterization.

## 2010-11-07 NOTE — Progress Notes (Signed)
Summary: discuss going back to work/ restriction  Phone Note Call from Patient Call back at Home Phone [phone]   Caller: Patient Reason for Call: Talk to Nurse Details for Reason: Pt wants to discuss going back to work / also note restriction. Initial call taken by: Lorne Skeens,  June 26, 2010 11:59 AM  Follow-up for Phone Call        Pt had cardiac cath 06/23/10--medical therapy recommended. 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  After the pt's cath Dr Riley Kill gave him a 10 pound weight restiction for 1 WEEK. The pt cannot RTW with any restrictions.  The pt needs a note to RTW on 07/03/10 with no restrictions. Note should be faxed to [facility] at [phone] and [facility] fax [phone].  Follow-up by: Julieta Gutting, RN, BSN,  June 26, 2010 12:59 PM  Additional Follow-up for Phone Call Additional follow up Details #1::        Return to work letter faxed.  Additional Follow-up by: Julieta Gutting, RN, BSN,  June 26, 2010 7:30 PM

## 2010-11-07 NOTE — Assessment & Plan Note (Signed)
Summary: 2wk f/u    Visit Type:  2 weeks follow up   History of Present Illness: He has not been having any pain since last visit.  He has been not doing as much.  Overall, when last seen, he was having some discomfort, but mainly with alot of lifting at work, up to 80 or 100 lbs apiece.  He would be able to walk about two around the blocks without difficulty.  He is stressed out of work.  He returns today to review the results of his myoview study.  Current Medications (verified): 1)  Ramipril 2.5 Mg Caps (Ramipril) .... Take 1 Capsule By Mouth Once A Day 2)  Study Drug Improve .... As Directed 3)  Aspirin 81 Mg Tbec (Aspirin) .... Take One Tablet By Mouth Daily 4)  Metoprolol Tartrate 50 Mg Tabs (Metoprolol Tartrate) .... Take One Tablet By Mouth Twice A Day 5)  Nitroglycerin 0.4 Mg/hr Pt24 (Nitroglycerin) .... As Directed For Chest Pain 6)  Xanax Xr 0.5 Mg Xr24h-Tab (Alprazolam) .... As Needed  Allergies: 1)  ! Penicillin  Past History:  Past Medical History: Last updated: 01/31/2009 CAD, UNSPECIFIED SITE (ICD-414.00) HYPERTENSION, UNSPECIFIED (ICD-401.9) HYPERLIPIDEMIA-MIXED (ICD-272.4) HEMORRHOIDS (ICD-455.6) DIVERTICULOSIS, MILD (ICD-562.10) COLONIC POLYPS, ADENOMATOUS (ICD-211.3) Hx of (L) ventricular dysfunction Allergies  Past Surgical History: Last updated: 03/19/2008 Status post angioplasty and bare metal stent placement  Family History: Last updated: 01/31/2009 Family History of Coronary Artery Disease:   Social History: Last updated: 01/31/2009 Full Time Married  Tobacco Use - No.  Alcohol Use - no  Review of Systems       The patient complains of chest pain and dyspnea on exertion.  The patient denies anorexia, fever, weight loss, weight gain, vision loss, decreased hearing, hoarseness, peripheral edema, prolonged cough, headaches, hemoptysis, abdominal pain, melena, hematochezia, and severe indigestion/heartburn.    Vital Signs:  Patient profile:    60 year old male Height:      69 inches Weight:      289 pounds BMI:     42.83 Pulse rate:   94 / minute Pulse rhythm:   regular Resp:     20 per minute BP sitting:   126 / 77  (left arm) Cuff size:   large  Vitals Entered By: Vikki Ports (July 06, 2009 4:30 PM)  Physical Exam  General:  Well developed, well nourished, in no acute distress. Neck:  Neck supple, no JVD. No masses, thyromegaly or abnormal cervical nodes. Lungs:  Clear bilaterally to auscultation and percussion. Heart:  Non-displaced PMI, chest non-tender; regular rate and rhythm, S1, S2 without murmurs, rubs or gallops. Carotid upstroke normal, no bruit. Normal abdominal aortic size, no bruits. Femorals normal pulses, no bruits. Pedals normal pulses. No edema, no varicosities. Extremities:  No clubbing or cyanosis. Neurologic:  Alert and oriented x 3.   Nuclear ETT  Procedure date:  06/22/2009  Findings:       QPS  Raw Data Images:  There is no interference from other nuclear activity. Stress Images:  There is decreased uptake in the inferior wall and apex. Rest Images:  There is decreased uptake in the inferior wall and apex; inferior defect slightly less prominent compared to the stress images. Subtraction (SDS):  These findings are consistent with prior inferior and apical infarct; minimal peri-infarct ischemia in the inferior wall. Transient Ischemic Dilatation:  1.12  (Normal <1.22)  Lung/Heart Ratio:  .42  (Normal <0.45)  Quantitative Gated Spect Images  QGS EDV:  165 ml QGS  ESV:  95 ml QGS EF:  42 % QGS cine images:  Apical akinesis; evidence of LVE.  Impression & Recommendations:  Problem # 1:  CORONARY ATHEROSCLEROSIS NATIVE CORONARY ARTERY (ICD-414.01) Symptoms seem to occur mainly with work and stress.  Has mod risk of recurrence.  Prio nuc study abnormal, and LVEF down.  Mild ischemia.  Options of cath vs conservative mgmt discussed with patient in great detail, and reviewed.  Lean toward  cath only in that his work is quite heavy, and his LVEF is already sig reduced, and loc of ischemia is read as inferior.  However, continued medication with mild ischemia remains an option.  Reviewed in detail.  Pt will speak with wife, and we will discuss. His updated medication list for this problem includes:    Ramipril 2.5 Mg Caps (Ramipril) .Marland Kitchen... Take 1 capsule by mouth once a day    Aspirin 81 Mg Tbec (Aspirin) .Marland Kitchen... Take one tablet by mouth daily    Metoprolol Tartrate 50 Mg Tabs (Metoprolol tartrate) .Marland Kitchen... Take one tablet by mouth twice a day    Nitroglycerin 0.4 Mg/hr Pt24 (Nitroglycerin) .Marland Kitchen... As directed for chest pain  Problem # 2:  HYPERLIPIDEMIA-MIXED (ICD-272.4) Currently on IMPROVE IT study therapy.  Problem # 3:  HYPERTENSION, UNSPECIFIED (ICD-401.9) Controlled. His updated medication list for this problem includes:    Ramipril 2.5 Mg Caps (Ramipril) .Marland Kitchen... Take 1 capsule by mouth once a day    Aspirin 81 Mg Tbec (Aspirin) .Marland Kitchen... Take one tablet by mouth daily    Metoprolol Tartrate 50 Mg Tabs (Metoprolol tartrate) .Marland Kitchen... Take one tablet by mouth twice a day  Patient Instructions: 1)  Your physician recommends that you schedule a follow-up appointment in: 2 MONTHS 2)  Your physician recommends that you continue on your current medications as directed. Please refer to the Current Medication list given to you today. 3)  Call the office if you would like to schedule a cardiac catheterization.

## 2010-11-07 NOTE — Assessment & Plan Note (Signed)
Summary: Vincent [name]   Visit Type:  2 months follow up  CC:  SOB.  History of Present Illness: Long discussion with patient about situation.  Wife was sick and has lost alot of weight after surgery.  Patient only gets symptoms when he does alot of very heavy lifting.  He wants to put off cath until April.  Sometimes he will have some burning in chest, and he can get sporadically.    Current Medications (verified): 1)  Ramipril 2.5 Mg Caps (Ramipril) .... Take 1 Capsule By Mouth Once A Day 2)  Study Drug Improve .... As Directed 3)  Aspirin 81 Mg Tbec (Aspirin) .... Take One Tablet By Mouth Daily 4)  Metoprolol Tartrate 50 Mg Tabs (Metoprolol Tartrate) .... Take One Tablet By Mouth Twice A Day 5)  Nitroglycerin 0.4 Mg/hr Pt24 (Nitroglycerin) .... As Directed For Chest Pain 6)  Xanax Xr 0.5 Mg Xr24h-Tab (Alprazolam) .... As Needed  Allergies: 1)  ! Penicillin  Vital Signs:  Patient profile:   60 year old male Height:      69 inches Weight:      282.50 pounds BMI:     41.87 Pulse rate:   76 / minute Pulse rhythm:   regular Resp:     20 per minute BP sitting:   126 / 80  (right arm) Cuff size:   large  Vitals Entered By: Vikki Ports (September 06, 2009 3:33 PM)  Physical Exam  General:  Well developed, well nourished, in no acute distress. Neck:  Neck supple, no JVD. No masses, thyromegaly or abnormal cervical nodes. Lungs:  Clear bilaterally to auscultation and percussion. Heart:  Non-displaced PMI, chest non-tender; regular rate and rhythm, S1, S2 without murmurs, rubs or gallops. Carotid upstroke normal, no bruit. Normal abdominal aortic size, no bruits. Femorals normal pulses, no bruits. Pedals normal pulses. No edema, no varicosities. Abdomen:  Bowel sounds positive; abdomen soft and non-tender without masses, organomegaly, or hernias noted. No hepatosplenomegaly. Extremities:  No clubbing or cyanosis. Neurologic:  Alert and oriented x 3.   Impression &  Recommendations:  Problem # 1:  CORONARY ATHEROSCLEROSIS NATIVE CORONARY ARTERY (ICD-414.01) Long discussion with patient.  He is generally stable at present.  He has mild symptoms.  We have recommended cath but he prefers to hold off at present.  On medical therpay, and worried about both wife and bills. His updated medication list for this problem includes:    Ramipril 2.5 Mg Caps (Ramipril) .Marland Kitchen... Take 1 capsule by mouth once a day    Aspirin 81 Mg Tbec (Aspirin) .Marland Kitchen... Take one tablet by mouth daily    Metoprolol Tartrate 50 Mg Tabs (Metoprolol tartrate) .Marland Kitchen... Take one tablet by mouth twice a day    Nitroglycerin 0.4 Mg/hr Pt24 (Nitroglycerin) .Marland Kitchen... As directed for chest pain  Problem # 2:  HYPERTENSION, UNSPECIFIED (ICD-401.9) Controlled His updated medication list for this problem includes:    Ramipril 2.5 Mg Caps (Ramipril) .Marland Kitchen... Take 1 capsule by mouth once a day    Aspirin 81 Mg Tbec (Aspirin) .Marland Kitchen... Take one tablet by mouth daily    Metoprolol Tartrate 50 Mg Tabs (Metoprolol tartrate) .Marland Kitchen... Take one tablet by mouth twice a day  Problem # 3:  HYPERLIPIDEMIA-MIXED (ICD-272.4) On IMPROVE IT STUDY DRUG.  Patient Instructions: 1)  Your physician recommends that you schedule a follow-up appointment in: 3 MONTHS 2)  Your physician recommends that you continue on your current medications as directed. Please refer to the Current Medication list given  to you today.

## 2010-11-07 NOTE — Progress Notes (Signed)
  Health Care Provider Certification paper recieved sent to [facility]  July 11, 2010 9:15 AM     Appended Document:  Pt signed ROI,also paid the $25.00, will call when all completed so he may pick up original paperwork..sent to [facility]

## 2010-11-07 NOTE — Miscellaneous (Signed)
  Clinical Lists Changes  Observations: Added new observation of RS STUDY: IMPROVE IT (09/09/2009 11:48)      Research Study Name: IMPROVE IT

## 2010-11-07 NOTE — Letter (Signed)
Summary: Return To Work  [employer], Main Office  [address]   [place], [place] [address]   Phone: [phone]  Fax: [phone]    06/26/2010  TO: Leodis Sias IT MAY CONCERN   RE: Vincent [name] [address] [place]   The above named individual is under my medical care and may return to work on July 03, 2010 with no restrictions.  The patient was advised to remain out of work June 22, 2010-July 02, 2010.   If you have any further questions or need additional information, please call.     Sincerely,    Arturo Morton. Riley Kill, MD Julieta Gutting, RN, BSN

## 2010-11-07 NOTE — Progress Notes (Signed)
Summary: Nuc. Pre-Procedure   Phone Note Outgoing Call Call back at [facility] Phone [phone]   Call placed by: Irean Hong, RN,  June 21, 2009 4:53 PM Summary of Call: Left message with information on Myoview Information Sheet (see scanned document for details).       Nuclear Med Background Indications for Stress Test: Evaluation for Ischemia, Stent Patency, PTCA Patency   History: Angioplasty, Echo, Heart Catheterization, Myocardial Infarction, Myocardial Perfusion Study, Stents  History Comments: 5/07: MI>Cath>PTCA/Stent(LAD) 11/07 MPS: Apical scar, (-) ischemia 7/09 Echo: EF=65%  Symptoms: DOE    Nuclear Pre-Procedure Cardiac Risk Factors: Family History - CAD, Hypertension, Lipids

## 2010-12-19 LAB — CARDIAC PANEL(CRET KIN+CKTOT+MB+TROPI)
CK, MB: 1.8 ng/mL (ref 0.3–4.0)
CK, MB: 2.1 ng/mL (ref 0.3–4.0)
Relative Index: 0.5 (ref 0.0–2.5)
Relative Index: 0.5 (ref 0.0–2.5)
Total CK: 348 U/L — ABNORMAL HIGH (ref 7–232)
Total CK: 401 U/L — ABNORMAL HIGH (ref 7–232)
Troponin I: 0.01 ng/mL (ref 0.00–0.06)
Troponin I: 0.03 ng/mL (ref 0.00–0.06)

## 2010-12-19 LAB — CK: Total CK: 191 U/L (ref 7–232)

## 2010-12-19 LAB — COMPREHENSIVE METABOLIC PANEL
ALT: 18 U/L (ref 0–53)
ALT: 32 U/L (ref 0–53)
AST: 20 U/L (ref 0–37)
AST: 41 U/L — ABNORMAL HIGH (ref 0–37)
Albumin: 3.1 g/dL — ABNORMAL LOW (ref 3.5–5.2)
Albumin: 4 g/dL (ref 3.5–5.2)
Alkaline Phosphatase: 107 U/L (ref 39–117)
Alkaline Phosphatase: 74 U/L (ref 39–117)
BUN: 9 mg/dL (ref 6–23)
BUN: 9 mg/dL (ref 6–23)
CO2: 26 mEq/L (ref 19–32)
CO2: 26 mEq/L (ref 19–32)
Calcium: 8.5 mg/dL (ref 8.4–10.5)
Calcium: 9.3 mg/dL (ref 8.4–10.5)
Chloride: 101 mEq/L (ref 96–112)
Chloride: 107 mEq/L (ref 96–112)
Creatinine, Ser: 0.84 mg/dL (ref 0.4–1.5)
Creatinine, Ser: 1 mg/dL (ref 0.4–1.5)
GFR calc Af Amer: >60 mL/min (ref >60)
GFR calc Af Amer: >60 mL/min (ref >60)
GFR calc non Af Amer: >60 mL/min (ref >60)
GFR calc non Af Amer: >60 mL/min (ref >60)
Glucose, Bld: 116 mg/dL — ABNORMAL HIGH (ref 70–99)
Glucose, Bld: 186 mg/dL — ABNORMAL HIGH (ref 70–99)
Potassium: 4.3 mEq/L (ref 3.5–5.1)
Potassium: 5.1 mEq/L (ref 3.5–5.1)
Sodium: 137 mEq/L (ref 135–145)
Sodium: 139 mEq/L (ref 135–145)
Total Bilirubin: 0.5 mg/dL (ref 0.3–1.2)
Total Bilirubin: 0.7 mg/dL (ref 0.3–1.2)
Total Protein: 5.8 g/dL — ABNORMAL LOW (ref 6.0–8.3)
Total Protein: 7.5 g/dL (ref 6.0–8.3)

## 2010-12-19 LAB — CBC
HCT: 38.6 % — ABNORMAL LOW (ref 39.0–52.0)
HCT: 44.4 % (ref 39.0–52.0)
Hemoglobin: 12.6 g/dL — ABNORMAL LOW (ref 13.0–17.0)
Hemoglobin: 14.7 g/dL (ref 13.0–17.0)
MCH: 29 pg (ref 26.0–34.0)
MCH: 29.3 pg (ref 26.0–34.0)
MCHC: 32.6 g/dL (ref 30.0–36.0)
MCHC: 33.1 g/dL (ref 30.0–36.0)
MCV: 88.6 fL (ref 78.0–100.0)
MCV: 88.9 fL (ref 78.0–100.0)
Platelets: 188 10*3/uL (ref 150–400)
Platelets: 235 10*3/uL (ref 150–400)
RBC: 4.34 MIL/uL (ref 4.22–5.81)
RBC: 5.01 MIL/uL (ref 4.22–5.81)
RDW: 14 % (ref 11.5–15.5)
RDW: 14.2 % (ref 11.5–15.5)
WBC: 6.9 10*3/uL (ref 4.0–10.5)
WBC: 8.9 10*3/uL (ref 4.0–10.5)

## 2010-12-19 LAB — DIFFERENTIAL
Basophils Absolute: 0 10*3/uL (ref 0.0–0.1)
Basophils Relative: 0 % (ref 0–1)
Eosinophils Absolute: 0.2 10*3/uL (ref 0.0–0.7)
Eosinophils Relative: 2 % (ref 0–5)
Lymphocytes Relative: 21 % (ref 12–46)
Lymphs Abs: 1.8 10*3/uL (ref 0.7–4.0)
Monocytes Absolute: 0.9 10*3/uL (ref 0.1–1.0)
Monocytes Relative: 11 % (ref 3–12)
Neutro Abs: 5.9 10*3/uL (ref 1.7–7.7)
Neutrophils Relative %: 67 % (ref 43–77)

## 2010-12-19 LAB — POCT CARDIAC MARKERS
CKMB, poc: 1.6 ng/mL (ref 1.0–8.0)
CKMB, poc: 1.6 ng/mL (ref 1.0–8.0)
Myoglobin, poc: 177 ng/mL (ref 12–200)
Myoglobin, poc: >500 ng/mL (ref 12–200)
Troponin i, poc: <0.05 ng/mL (ref 0.00–0.09)
Troponin i, poc: <0.05 ng/mL (ref 0.00–0.09)

## 2010-12-19 LAB — HEMOGLOBIN A1C
Hgb A1c MFr Bld: 7.9 % — ABNORMAL HIGH (ref <5.7)
Mean Plasma Glucose: 180 mg/dL — ABNORMAL HIGH (ref <117)

## 2010-12-19 LAB — GLUCOSE, CAPILLARY
Glucose-Capillary: 132 mg/dL — ABNORMAL HIGH (ref 70–99)
Glucose-Capillary: 135 mg/dL — ABNORMAL HIGH (ref 70–99)
Glucose-Capillary: 136 mg/dL — ABNORMAL HIGH (ref 70–99)
Glucose-Capillary: 150 mg/dL — ABNORMAL HIGH (ref 70–99)
Glucose-Capillary: 169 mg/dL — ABNORMAL HIGH (ref 70–99)
Glucose-Capillary: 219 mg/dL — ABNORMAL HIGH (ref 70–99)
Glucose-Capillary: 286 mg/dL — ABNORMAL HIGH (ref 70–99)

## 2010-12-19 LAB — TROPONIN I: Troponin I: 0.01 ng/mL (ref 0.00–0.06)

## 2010-12-19 LAB — CK TOTAL AND CKMB (NOT AT ARMC)
CK, MB: 2.6 ng/mL (ref 0.3–4.0)
Relative Index: 0.7 (ref 0.0–2.5)
Total CK: 352 U/L — ABNORMAL HIGH (ref 7–232)

## 2011-02-19 ENCOUNTER — Encounter (INDEPENDENT_AMBULATORY_CARE_PROVIDER_SITE_OTHER): Payer: Self-pay

## 2011-02-19 DIAGNOSIS — R0989 Other specified symptoms and signs involving the circulatory and respiratory systems: Secondary | ICD-10-CM

## 2011-02-20 NOTE — Assessment & Plan Note (Signed)
[facility]                            CARDIOLOGY OFFICE NOTE   NAME:Vincent [name], Vincent [name]                       MRN:          [id number]  DATE:11/29/2008                            DOB:          10/13/1950    [name] is in for followup.  Cardiacwise, he is doing well.  He has  not been having any major problems.  He denies any ongoing chest pain.  He and his wife do sleep in separate rooms, however, as he snores  loudly.  He also has trouble sleeping for complete night.  He wakes up  in the middle of the night frequently.   MEDICATIONS:  1. Ramipril 2.5 mg daily.  2. Study drug with improvement.  3. Aspirin 81 mg daily.  4. Metoprolol tartrate 50 mg b.i.d.  5. Clobetasol ointment daily.   On physical, blood pressure is 110/72 and pulse 74.  Lung fields clear.  Cardiac rhythm is regular.  Extremities reveal no edema.   EKG reveals normal sinus rhythm.  There is low voltage QRS.   Last echocardiogram demonstrates ejection fraction 65%, preserved  overall systolic function.   IMPRESSION:  1. Coronary artery disease with prior myocardial infarction, treated      with percutaneous transluminal coronary angioplasty and stenting of      the left anterior descending artery with a non drug-eluting      platform.  2. History of left ventricular mural thrombus, treated with Coumadin.  3. Possible sleep apnea.   RECOMMENDATIONS:  1. Return to clinic in 6 months.  2. Sleep study.  3. Continue followup with Dr. Clelia Croft.      Arturo Morton. Riley Kill, MD, [facility]  Electronically Signed    TDS/MedQ  DD: 11/29/2008  DT: 11/30/2008  Job #: [id number]   cc:   Kari Baars, M.D.

## 2011-02-20 NOTE — Letter (Signed)
April 14, 2008    Weber Cooks. Clelia Croft, MD  [facility]  [address]  [place], [place] [address]   RE:  FRANCES, JOYNT  MRN:  [id number]  /  DOB:  22-May-1951   Dear Gala Romney:   I had the pleasure of seeing your nice patient, Dannell Gortney, in the  office today in followup and it was a pleasure to speak with you about  him.  As you know from reading Steve's notes, we saw him a couple of  days ago.  You had previously seen him in April, and there was a concern  about cellulitis.  As you know, he has developed some skin breakdown  over the lateral malleoli, and particularly on the left he has developed  some erythema.  Per my conversation with Gala Romney, we elected to start him  on doxycycline since he has had a history of PENICILLIN allergy.  I did  bring him in for some venous Dopplers because of some modest low  swelling.  The Dopplers demonstrated easily compressed veins with no  evidence of intraluminal thrombus or incompetence.  He was noted to have  some enlarged lymph nodes in both legs bilaterally.  His echocardiogram  demonstrated a well-preserved left ventricular ejection fraction with an  ejection fraction of 65% and no definite wall motion abnormalities.   He has only taken a few doses since he left here, but there has been no  deterioration or worsening of his findings of cellulitis.   PHYSICAL EXAMINATION:  On his examination today, there is some erythema  in the left lower extremity and bilateral changes suggestive of some  possibly venostasis abnormalities.   He also had had some chest discomfort, but it has been markedly  different from what he has had in the past.  Specifically, his shoulders  bother him and he is doing some more heavy lifting at the present time.   Today, his exam was virtually unchanged.   We plan to continue to follow Onofrio up in the next few weeks.  Thanks  again for agreeing to see him on Monday, and reassessing his venostasis  changes,  and also follow up on the cellulitis.  He knows to call if  there is any deterioration in the overall situation.  He will also call  us if there is any change in his overall status.    Sincerely,      Arturo Morton. Riley Kill, MD, [facility]  Electronically Signed    TDS/MedQ  DD: 04/14/2008  DT: 04/15/2008  Job #: [id number]

## 2011-02-20 NOTE — Procedures (Signed)
[facility]                              EXERCISE TREADMILL   NAME:Knies, TRISHA MORANDI                       MRN:          [id number]  DATE:07/14/2007                            DOB:          [birth year]    DURATION OF EXERCISE:  Three minutes 37 seconds.   MAXIMUM HEART RATE:  111, 67%.   COMMENTS:  Michail exercised today on the Bruce protocol.  This  represented a maximum effort for him.  He did not have any chest pain or  significant shortness of breath.  The patient did stop due to fatigue.  At maximum stress there was no significant ST depression and he had no  exertional chest pain.   Mr. Yuhas is stable following his primary PCI one year ago.  This did  not provoke ischemia.  His job does require in the past some heavy  lifting and he had an episode while lifting a heavy tank.  He has told  me though that he can avoid really a lot of the heavy lifting that he  has traditionally had to do.  At the present time, this would not  precipitate a repeat cardiac catheterization.  We will continue to  follow him closely.   I will see him back in followup in 3 months.     Arturo Morton. Riley Kill, MD, [facility]  Electronically Signed    TDS/MedQ  DD: 07/14/2007  DT: 07/14/2007  Job #: [id number]

## 2011-02-20 NOTE — Assessment & Plan Note (Signed)
[facility]                            CARDIOLOGY OFFICE NOTE   NAME:Vincent [name], SAMIR ISHAQ                       MRN:          [id number]  DATE:06/02/2007                            DOB:          July 13, 1951    Mr. Apuzzo is in for a followup.  He has had some complaints of a  pulling sensation in the right chest.  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  He has also had a couple of episodes where he  got this sharp briefly lasting pain.  He does not get any sweating,  diaphoresis or shortness of breath associated with this.  Activity and  walking has not made this any worse.  A very deep breath may bring some  discomfort on in the right chest.   PHYSICAL EXAMINATION:  VITAL SIGNS:  Today blood pressure 128/82, pulse  82.  LUNGS:  Lung fields are clear.  CARDIAC:  Cardiac rhythm is entirely regular.  EXTREMITIES:  Reveal no edema.   His EKG is entirely within normal limits.   To summarize this patient has coronary artery disease with some LV  dysfunction and an occluded LAD that was stented.  He has done well, and  followup studies have been unremarkable.  His symptoms are somewhat  atypical.  We plan to bring him in for a routine exercise treadmill test  next week.  Though he does walk on a regular basis I think it would be  entirely reasonable to do this.  Should he have any problems in the  interim he is to contact us promptly.     Arturo Morton. Riley Kill, MD, [facility]  Electronically Signed    TDS/MedQ  DD: 06/02/2007  DT: 06/03/2007  Job #: [id number]

## 2011-02-20 NOTE — Assessment & Plan Note (Signed)
[facility]                            CARDIOLOGY OFFICE NOTE   NAME:Vincent [name], Vincent [name]                       MRN:          [id number]  DATE:04/29/2008                            DOB:          1951/08/28    [name] is in for followup.  He has a little bit of shortness of breath.  Overall, he is doing well.  Since I had last seen him, he saw both Dr.  Clelia Croft, and then subsequently Cherlyn Roberts.  He has been placed on  steroid creams in lower extremities.  He has had improvement in the  appearance.  He denies any ongoing chest pain.   Importantly, in the interim the patient has had a 2-D echo.  This  reveals an ejection fraction of 65%.  Overall function is preserved.   Today, his blood pressure is 118/78 and pulse 76.  Lung fields clear.  Cardiac rhythm is regular.  There is no extremity edema.   Overall, [name] has improved.  At the present time, we probably do not  need to see him back in 6 months as he seems to be stable from a cardiac  standpoint.  Should he have any problems in the interim, he is to  contact me.  His venous Dopplers do demonstrate some enlarged lymph  nodes in both groins and this will need to be followed up in the next  few months.     Arturo Morton. Riley Kill, MD, [facility]  Electronically Signed    TDS/MedQ  DD: 04/29/2008  DT: 04/30/2008  Job #: [id number]

## 2011-02-20 NOTE — Assessment & Plan Note (Signed)
[facility]                         GASTROENTEROLOGY OFFICE NOTE   NAME:[name], [name]                       MRN:          [id number]  DATE:03/10/2007                            DOB:          04/29/1951    [name] is seen today as an urgent add-on.  He presented to the  office today complaining of pain and swelling in the dorsal part of his  right hand since his colonoscopy.  He was seen initially by Vincent Carnes,  RN.  He underwent a screening colonoscopy on Feb 28, 2007.  A small  polypoid area of mucosa was biopsied in the descending colon which did  not show any neoplastic tissue.  Left colon diverticulosis and internal  hemorrhoids were noted.  He states that he had pain in his right hand at  the time the IV was placed and following the procedure, he continued to  note pain in his right hand.  He states it has been swollen and tender  since the procedure.  He has not noted any bruising, loss of motion,  fevers, chills or redness.  He states he did not contact us prior to  this because he felt symptoms would go away.  He appears to be quite  upset about this problem.  He had no complaints of abdominal pain or  bleeding following the procedure and states that other than his right  hand complaints he has done well since his procedure.  He does not  recall injuring his hand in any other way.   CURRENT MEDICATIONS:  Listed on the chart, updated and reviewed.   ALLERGIES:  NONE KNOWN.   PHYSICAL EXAMINATION:  GENERAL APPEARANCE:  An overweight white male who  appears to be upset and anxious.  VITAL SIGNS:  Blood pressure 124/70, pulse 88 and regular.  EXTREMITIES:  Examination of his right upper extremity reveals full  range of motion in the shoulder, elbow, wrist and all the digits.  The  dorsum of his right hand is mild edematous.  There is tenderness over  the third, fourth and fifth metacarpal bones.  His grip strength appears  normal and  symmetrical bilaterally.  I cannot appreciate any bruising or  isolated thrombosis or phlebitis.   ASSESSMENT/PLAN:  Right hand edema and tenderness.  I suspect he has a  superficial phlebitis or thrombophlebitis related to his IV.  Rule out a  right hand fracture or another injury that may have inadvertently  happened while he was in the [facility] or after he left the [facility].  Right hand  and right wrist Xray ordered. He is to take 400 mg of Advil q.i.d. for  up to seven days and use warm compresses on his right hand for seven  days.  He is further advised to elevate his right hand in the evening  and at night on pillows.  Return office visit in two weeks.  He is to  call if his symptoms do not respond and I may ask him to see Dr. Clelia Croft  for further evaluation if his  symptoms do not respond promptly to  the above measures.  After he left  the office the RN told me she felt he would not go for the ordered  Xrays.     Venita Lick. Russella Dar, MD, [facility]  Electronically Signed    MTS/MedQ  DD: 03/10/2007  DT: 03/11/2007  Job #: [id number]   cc:   Kari Baars, M.D.  Arturo Morton. Riley Kill, MD, [facility]

## 2011-02-20 NOTE — Assessment & Plan Note (Signed)
[facility] HEALTHCARE                            CARDIOLOGY OFFICE NOTE   NAME:Mendibles, DAIN LASETER                       MRN:          [id number]  DATE:04/12/2008                            DOB:          1951/09/17    Mr. Schrager is in for followup.  He says they are working him very very  hard.  Because of the economic situation, he says they are pushing the  work somewhat harder.  He occasionally will have a little bit of chest  discomfort.  It has not been frequent.  His bigger complaint is that of  swelling bilaterally in the extremities and some excoriation.  He has  also developed some erythema on the lower extremity that was slightly  more prominent than in the past.   MEDICATIONS:  1. Ramipril 2.5 mg daily, improved with study drug.  2. Aspirin 81 mg daily.  3. Metoprolol 50 mg p.o. b.i.d.   PHYSICAL EXAMINATION:  His weight is 279 pounds.  Blood pressure 120/75.  The pulse is 86.  There are venous stasis changes bilaterally in the  lower extremities.  Of note, there is erythema and scaling bilaterally  on the lateral malleoli.  There is also some erythema of the left lower  extremity and the patient tells me she has had previous of the findings  for which antibiotic was prescribed.  His cardiac exam is otherwise  unremarkable.   I spoke with Dr. Evlyn Kanner by phone today.  The the patient has had  previously thought to have cellulitis in April of this year and was  prescribed doxycycline because of an underlying so-called penicillin  allergy.  Moreover, we decided to go ahead and place him on this.  I am  going to go ahead and get venous Dopplers to make sure that he does not  have DVT.  I doubt that this is the case.  We have also given him a  prescription for doxycycline 100 mg b.i.d. for 10 days, which he will  begin and initiate the therapy.  His EKG today reveals normal sinus  rhythm with low-voltage QRS and nonspecific ST abnormality.  We are  going  to go ahead and get a 2D echocardiogram on Wednesday.  He  continues to have chest symptoms.  He may require repeat cardiac  catheterization, but my hope is to be able to hold off on this.  I would  try to touch base with Dr. Clelia Croft when I see him back on Wednesday.     Arturo Morton. Riley Kill, MD, [facility]  Electronically Signed    TDS/MedQ  DD: 04/12/2008  DT: 04/13/2008  Job #: [id number]

## 2011-02-20 NOTE — Assessment & Plan Note (Signed)
[facility] HEALTHCARE                            CARDIOLOGY OFFICE NOTE   NAME:[name], Vincent ABAIR                       MRN:          [id number]  DATE:11/03/2007                            DOB:          [birth year]    [name] is in for follow-up.  To briefly summarize, he is doing  reasonably well.  He is not had any chest pain.  He is no longer smoking  or chewing tobacco.  He has not been walking as much as he should, but  overall is doing reasonably well.  He is up about to 272 pounds.   PHYSICAL:  The weight is 272 pounds.  Blood pressure 120/80, pulse 60.  Lung fields are clear.  The cardiac rhythm is regular.  No significant murmurs are appreciated.   IMPRESSION:  1. Status post myocardial infarction treated with percutaneous      intervention.  2. Hypertension well controlled on current medication.  3. Hypercholesterolemia in the IMPROVE-IT study.   DISPOSITION:  1. The patient return to clinic in 6 months follow-up time.  2. He will remain in IMPROVE-IT study at the present time.   ADDENDUM:  An EKG reveals normal sinus rhythm with minor nonspecific T-  wave abnormality.     Arturo Morton. Riley Kill, MD, [facility]  Electronically Signed    TDS/MedQ  DD: 11/03/2007  DT: 11/03/2007  Job #: [id number]

## 2011-02-23 NOTE — Assessment & Plan Note (Signed)
[facility] HEALTHCARE                            CARDIOLOGY OFFICE NOTE   NAME:Vincent [name], Vincent [name]                       MRN:          [id number]  DATE:12/02/2006                            DOB:          [birth year]    Primary cardiologist Dr. Maisie Fus D. Riley Kill, primary care physician is  Dr. Cyndie Mull.   PATIENT PROFILE:  A 60 year old Caucasian male with a prior history of  coronary artery disease and anterior MI in May of 2007, who was recently  admitted for atypical reproducible chest pain who presents for hospital  followup.   PROBLEM LIST:  1. Coronary artery disease.      a.     Feb 16, 2006 acute anterior ST segment elevation MI with       catheterization revealing an occluded LAD with otherwise       nonobstructive coronary artery disease.  The LAD was successfully       stented with a 3.5 x 18 millimeter guidant vision stent.  2. Ischemic cardiomyopathy.      a.     Ejection fraction previously documented as mildly decreased.      b.     September 02, 2006 a 2-D echocardiogram a ejection fraction       of 55%, there is mild kinesis of the periapical wall.      c.     September 02, 2006 adenosine Myoview, ejection fraction of       46%, prior apical infarct and no ischemia.  3. History of apical thrombus following MI.      a.     Coumadin discontinued September 06, 2006 after repeat echo       did not show apical thrombus.  4. Obesity.  5. Hyperlipidemia.  6. Anxiety.   HISTORY OF PRESENT ILLNESS:  A 60 year old Caucasian male with prior  history of coronary artery disease and anterior MI as outlined above.  He was in his usual state of health until November 11, 2006 when he began  to experience left arm and neck discomfort that was reproducible in  nature.  He also had a headache similar to what he experienced with his  MI and presented to the [facility] emergency department.  He was placed  in observation overnight and ruled out for MI and discharged  home in  light of the atypical nature of his symptoms as well a negative Myoview  in November of 2007.  He has not had any recurrent chest pain and denies  any dyspnea on exertion, Paroxysmal nocturnal dyspnea, orthopnea,  dizziness, syncope, edema or early satiety.   ALLERGIES:  No known drug allergies.   HOME MEDICATIONS:  1. Claritin p.r.n.  2. Improve It study drug daily.  3. Aspirin 325 mg daily.  4. Metoprolol 50 mg b.i.d.  5. Triamcinolone 0.5% cream b.i.d. p.r.n.  6. Altace 2.5 mg daily.  7. Nitroglycerin p.r.n.   PHYSICAL EXAMINATION:  Blood pressure 121/68, heart rate 68,  respirations 16, his weight is 230 pounds.  A pleasant white male in no acute distress.  Awake, alert and  oriented x  3.  NECK:  No bruits or jugular venous distension.  LUNGS:  Respirations regular and unlabored, clear to auscultation.  CARDIAC:  Regular S1, S2, no S3, S4 or murmurs.  ABDOMEN:  Round, soft, nontender, nondistended, bowel sounds present x  4.  EXTREMITIES:  Warm, dry and pink.  No clubbing, cyanosis or edema.  Dorsalis pedis, posterior tibial pulses 2+ and equal bilaterally.   ACCESSORY CLINICAL FINDINGS:  EKG shows sinus rhythm with T wave  inversion in lead III and flattening in leads II and aVF as well as V4-  V6.  No acute changes.   ASSESSMENT/PLAN:  1. Atypical chest pain/coronary artery disease.  The patient has had      no recurrence of chest pain.  Otherwise he is doing well.  He      remains aspirin, statin, beta blocker and ACE inhibitor therapy.  2. Ischemic cardiomyopathy.  The patient remains on beta blocker and      ACE inhibitor and is tolerating this well.  By last check ejection      fraction was 55% in November of 2007.  3. History of left ventricular thrombus.  This was not seen on most      recent echo in November of 2007.  He was taken off of Coumadin at      that time.  4. Hyperlipidemia.  He remains on statin therapy.  5. Obesity.  The patient is  encouraged to continue with the diet and      initiate exercise for weight loss.  He has dropped about 50 pounds      since his MI in May of 2007.   DISPOSITION:  The patient will follow up with Dr. Riley Kill in  approximately 6 months or sooner as necessary.      Nicolasa Ducking, ANP  Electronically Signed      Everardo Beals. Juanda Chance, MD, [facility]  Electronically Signed   CB/MedQ  DD: 12/02/2006  DT: 12/02/2006  Job #: [id number]

## 2011-02-23 NOTE — Cardiovascular Report (Signed)
Vincent [name], Vincent [name]                ACCOUNT NO.:  [id number]   MEDICAL RECORD NO.:  [id number]          PATIENT TYPE:  INP   LOCATION:  2110                         FACILITY:  [facility]   PHYSICIAN:  Arturo Morton. Riley Kill, M.D. [facility] OF BIRTH:  [birth year]   DATE OF PROCEDURE:  02/16/2006  DATE OF DISCHARGE:                              CARDIAC CATHETERIZATION   DATE OF STUDY:  Feb 16, 2006.   INDICATIONS:  The patient is a 60 year old gentleman with stuttering chest  pain who was seen in the emergency room and noted to have ST elevation  myocardial infarction. Many of the changes were in the anterolateral leads  predominately. He was brought emergently to the cardiac catheterization lab.   PROCEDURES:  1.  Left heart catheterization.  2.  Selective coronary arteriography.  3.  Selective left ventriculography.  4.  PTCA and stenting of the left anterior descending artery.   DESCRIPTION OF PROCEDURE:  The patient was brought to the catheterization  laboratory and prepped and draped in the usual fashion. Through an anterior  puncture the right femoral artery was entered. A #6-French sheath was  initially placed. Following this, views of the right coronary artery were  obtained. We then obtained views of the left coronary artery. This was done  with a guiding catheter and left ventriculography was deferred to the end of  the case. The patient was noted to have a flush occlusion of the proximal  left anterior descending artery. ACT was checked and was slightly  subtherapeutic and 2000 units of heparin was administered. Eptifibatide was  also given. The patient was given 300 mg of oral clopidogrel. The  anticipation was to give him an additional 300 mg. The patient developed  nausea and vomiting. We eventually gave him an additional 150 mg, but the  300 mg was deferred for this reason. The LAD was crossed with a Prowater  wire, and this established reperfusion. We subsequently went and placed  a  balloon across the artery which was a 3.5 x 12 Quantum Maverick balloon.  ACTs were checked throughout and were found to be appropriate. A 3.5 x 18  Guidant Vision stent was then placed across the artery and then taken up to  about 13 to 14 atmospheres. Post dilatation was initially done with a 4-mm  Quantum Maverick balloon up to 14 atmospheres. The  distal end of the stent  was also dilated then with a 4.5 Quantum Maverick balloon up to 12  atmospheres. There was marked improvement in the appearance of the artery.  There was re-establishment of TIMI-III flow. Other than minor luminal  irregularities there were no high-grade areas of disease noted distally. The  ST segments improved substantially. Intracoronary verapamil was administered  to try to improve on microvascular perfusion. Following this all catheters  were removed, the pigtail was placed in the left ventricle, and  ventriculography was performed in the RAO projection using 24 mL of contrast  at 12 mL per second. All catheters were then removed and the femoral sheath  was sewn into place. He was taken to the medical  intensive care unit in  satisfactory clinical condition.   HEMODYNAMIC DATA:  1.  Central aortic pressure 131/96.  2.  Left ventricular pressure 134/28.  3.  No gradient pullback across the aortic valve.   ANGIOGRAPHIC DATA:  1.  Ventriculography was done in the RAO projection. There was hypokinesis      of the mid anterolateral wall and what appears to be akinesis to even      dyskinesis of the apex and distal inferior segment. We estimated the      ejection fraction at 40%. Contrast ventriculography was not calculated      because of incomplete dye opacification.  2.  The left main coronary artery is free of critical disease.  3.  The LAD is totally occluded. Following re-perfusion, the total occlusion      was reduced from 100% down to 0% with an excellent angiographic result.      The distal LAD had some  mild luminal irregularity with perhaps 30% after      the major diagonal.  4.  The circumflex is a moderate size vessel with about 30% proximal      narrowing.  5.  The right coronary artery is a large caliber vessel providing posterior      descending and posterolateral branch with about 30% narrowing.   CONCLUSION:  1.  Moderate wall motion abnormality involving the left anterior descending      territory.  2.  Total occlusion of the proximal left anterior descending with successful      re-perfusion using direct primary angioplasty.  3.  Other scattered luminal irregularities as noted above.   DISPOSITION:  The patient will be treated medically. He will need aggressive  risk factor reduction.      Arturo Morton. Riley Kill, M.D. [facility]  Electronically Signed     TDS/MEDQ  D:  02/16/2006  T:  02/17/2006  Job:  [id number]

## 2011-02-23 NOTE — Assessment & Plan Note (Signed)
[place] [facility]                         GASTROENTEROLOGY OFFICE NOTE   NAME:[name] [name] MORGAN                       MRN:          [id number]  DATE:01/13/2007                            DOB:          01-22-1951    REASON FOR REFERRAL:  Colorectal cancer screening and mild diarrhea.   HISTORY OF PRESENT ILLNESS:  [name] [name] is a 60 year old white male,  referred through the courtesy of Dr. Eric [name] who relates intermittent  difficulties with mild diarrhea, mainly after eating salads and greens.  He also has very infrequent episodes of heartburn that respond to over  the counter antacids.  He is status post anterior wall myocardial  infarction, treated with a bare metal stent to the LAD in May of 2007.  Followup stress tests have been unremarkable.  He was seen by Dr.  Riley [name] in November 2007 and he was felt to be stable and Coumadin was  discontinued.  He was hospitalized in February 2008 for arm and neck  pain with no cardiac or neurologic problem uncovered.  There is no  family history of colon cancer, colon polyps, or inflammatory bowel  disease.  He notes no melena, hematochezia, change in stool caliber,  change in bowel habits, abdominal pain, rectal pain, or weight loss.   PAST MEDICAL HISTORY:  1. Hypertension.  2. Coronary artery disease, status post anterior wall myocardial      infarction.  3. History of left ventricular dysfunction with improvement in left      ventricular function.  4. Hyperlipidemia.  5. Allergies.  6. Status post angioplasty and bare metal stent placement.   CURRENT MEDICATIONS:  Listed on the chart, updated and reviewed.   MEDICATION ALLERGIES:  NONE KNOWN.   SOCIAL HISTORY:  As per the handwritten [name].   REVIEW OF SYSTEMS:  As per the handwritten [name].   PHYSICAL EXAMINATION:  Overweight white male in no acute distress.  Height 5 feet 9 inches, weight 236 pounds, blood pressure is 112/70,  pulse 60 and regular.  HEENT:  Anicteric sclera, oropharynx clear.  CHEST:  Clear to auscultation bilaterally.  CARDIAC:  Regular rate and rhythm without murmurs appreciated.  ABDOMEN:  Soft, nontender, nondistended, normoactive bowel sounds, no  palpable organomegaly or masses.  He has a small umbilical hernia.  RECTAL:  Deferred to time of colonoscopy.  EXTREMITIES:  Without clubbing, cyanosis, edema.  NEUROLOGIC:  Alert and oriented x3.  Grossly nonfocal.   ASSESSMENT/PLAN:  Colorectal cancer screening and mild diarrhea related  to certain foods.  Risks, benefits, and alternatives to colonoscopy and  possible biopsy, and possible polypectomy discussed with the patient and  he consents to proceed.  He will be scheduled electively.     [name] [name]. [name] Dar, MD, [facility]  Electronically Signed    MTS/MedQ  DD: 01/13/2007  DT: 01/13/2007  Job #: [id number]   cc:   [name] [name], M.D.  [name] [name]. Vincent Kill, MD, [facility]

## 2011-02-23 NOTE — Assessment & Plan Note (Signed)
[facility] HEALTHCARE                              CARDIOLOGY OFFICE NOTE   NAME:Kistler, CHAWN SPRAGGINS                       MRN:          [id number]  DATE:07/19/2006                            DOB:          03-15-1951    Mr. Renovato is in for followup.  He is stable.  He had an episode of some  discomfort yesterday, but it was fairly brief and was not similar to what he  had had in the past.  He also ate tomatoes, onions and a number of other  things which may have produced his symptoms.  He has not been having any  exertional symptoms.  The patient had a non-drug eluting stent placed and  very large stent at that, which would make the likelihood of target vessel  revascularization need less.  He is also scheduled to come off of his  Coumadin in about a month.  His last INR was 1.9.  Dr. Sherryll Burger has placed him  on Altace.   On exam today, the blood pressure is 115/70, the pulse is 69, the lung  fields are clear, and the cardiac rhythm is regular.   His EKG reveals normal sinus rhythm.  There is a delay in R-wave  progression.  There is a leftward oriented axis.   Overall, the patient is doing well.  We plan to see him back in followup in  about one month.  If he has any recurrence of symptoms, he is to contact us  promptly.  His EKG is markedly improved from the time of his infarct.  When  we see him back, we will schedule an electrocardiogram, and if this looks  good, we will defer treatment with Coumadin at that time.  Should he have  any problems in the interim, he is to contact us.       Arturo Morton. Riley Kill, MD, [facility]     TDS/MedQ  DD:  07/19/2006  DT:  07/21/2006  Job #:  [id number]   cc:   Kari Baars, M.D.

## 2011-02-23 NOTE — Discharge Summary (Signed)
NAMERAWN, QUIROA                ACCOUNT NO.:  [id number]   MEDICAL RECORD NO.:  [id number]          PATIENT TYPE:  INP   LOCATION:  3703                         FACILITY:  [facility]   PHYSICIAN:  Bevelyn Buckles. Bensimhon, MDDATE OF BIRTH:  August 28, 1951   DATE OF ADMISSION:  11/13/2006  DATE OF DISCHARGE:  11/14/2006                               DISCHARGE SUMMARY   PRIMARY CARDIOLOGIST:  Dr. Shawnie Pons.   PRIMARY CARE PHYSICIAN:  Dr. Cyndie Mull.   PRINCIPAL DIAGNOSIS:  Left arm and neck pain.   SECONDARY DIAGNOSES:  1. Coronary artery disease, status post acute anterior myocardial      infarction in May of 2007 with a totally occluded left anterior      descending that was successfully stented with a bare metal stent.  2. Hyperlipidemia.  3. Anxiety.   ALLERGIES:  NO KNOWN DRUG ALLERGIES.   PROCEDURE:  None.   HISTORY OF PRESENT ILLNESS:  [age]-year-old male with a prior  history of CAD and anterior MI in May of 2007 with stenting of the LAD  who was in his usual state of health until 2 days prior to admission  when he began to experience left arm and neck discomfort that was  reproducible in nature.  He also had headaches similar to what he  experienced with his MI.  As a result, he presented to the [facility],  where EKG showed no acute findings and cardiac markers were negative.  He was placed in observation status overnight to rule out MI.   HOSPITAL COURSE:  Patient ruled out for MI.  He has continued to have  some atypical, noncardiac left arm and neck discomfort that is  reproducible with movement and likely musculoskeletal in nature.  He is  being discharged home today.   DISCHARGE LABS:  Hemoglobin 15.0, hematocrit 45.0, WBC 6.4, platelets  226.  PT 14.1, INR 1.1, PTT 37.  Sodium 139, potassium 4.7, chloride  103, CO2 30, BUN 10, creatinine 0.85, glucose 97.  Total bilirubin 0.7,  alkaline phosphatase 68, AST 26, ALT 27, total protein 7.1, albumin 4.0,  calcium  9.6, hemoglobin A1c 6.1.  CK 104, MB 1.9, troponin-I 0.02.  Total cholesterol 122, triglycerides 81, HDL 43, LDL 63.   DISPOSITION:  Patient is being discharged home today in good condition.   FOLLOWUP PLANS AND APPOINTMENT:  He is asked to follow up with his  primary care physician, Dr. Cyndie Mull as previously scheduled.  Follow up  with Dr. Rosalyn Charters PA on February 25 at 10:15 for a post catheter check.   DISCHARGE MEDICATIONS:  Patient is not sure of what any of the doses of  his medications are.  1. He is advised to continue his __________ study drug q.p.m.  2. Aspirin 81 mg daily.  3. Metoprolol 50 mg b.i.d.  4. Xanax as previously prescribed.   OUTSTANDING LABS:  Today is none.   DURATION OF DISCHARGE ENCOUNTER:  Thirty-two minutes including physician  time.      Nicolasa Ducking, ANP      Bevelyn Buckles. Bensimhon, MD  Electronically Signed  CB/MEDQ  D:  11/14/2006  T:  11/15/2006  Job:  [id number]   cc:   Kari Baars, M.D.

## 2011-02-23 NOTE — H&P (Signed)
Vincent [name], Vincent [name]                ACCOUNT NO.:  [id number]   MEDICAL RECORD NO.:  [id number]          PATIENT TYPE:  INP   LOCATION:  1826                         FACILITY:  [facility]   PHYSICIAN:  Audery Amel, MD     DATE OF BIRTH:  [birth year]   DATE OF ADMISSION:  02/16/2006  DATE OF DISCHARGE:                                HISTORY & PHYSICAL   PRIMARY CARDIOLOGIST:  Unassigned.   CHIEF COMPLAINT:  Chest pain.   HISTORY OF PRESENT ILLNESS:  This patient is a 60 year old white male with  no prior cardiac history, who presents to the ER with crushing substernal  chest pain that had onset approximately 1 hour earlier.  The patient states  he was at work, where he began to experience centralized chest discomfort  which he attributed to indigestion.  The pain failed to subside and  continued to accelerate to 10/10.  He denies any radiation to his left arm  or jaw, but there was associated nausea, vomiting and diaphoresis.  The  patient was transported to [facility] Emergency Department for further  evaluation and management.  On arrival, EKG was performed which revealed  anterolateral ST elevation.  The patient was in moderate distress and  continued to have significant chest discomfort and the code STEMI protocol  was activated.  The patient was treated with heparin bolus and drip,  metoprolol IV, and nitroglycerin.  He continued to have chest discomfort,  which did respond to morphine.   PAST MEDICAL HISTORY:  None.   ALLERGIES:  No known drug allergies.   MEDICATIONS:  Claritin OTC daily.   SOCIAL HISTORY:  The patient lives in [place] with his wife.  He works at  [employer].  He denies tobacco, alcohol or other illicit  substances.   FAMILY HISTORY:  He has a family history notable for father who died from  myocardial infarction at the age of 52.  His mother died from cancer in her  10s.  He does have a sister who underwent percutaneous coronary intervention  at an early age.   REVIEW OF SYSTEMS:  Complete review of systems is negative, other than noted  in the HPI.   PHYSICAL EXAMINATION:  VITAL SIGNS:  Blood pressure is 127/76, pulse is 81,  O2 SATs are 97% on room air.  GENERAL:  The patient is alert and oriented x3, and in a moderate amount of  distress.  HEENT:  Normocephalic, atraumatic.  EOMI, PERRL.  Nares patent.  OP is clear  without erythema or exudate.  NECK:  Supple with full range of motion, mild JVD.  LYMPHADENOPATHY:  None.  CARDIOVASCULAR:  Distant S1 and S2 without murmurs, rubs, or gallops.  Pulses are 2+ and symmetric bilaterally and there is no lower extremity  edema.  LUNGS:  Diffuse bibasilar crackles over the posterior lung fields, class II.  SKIN:  No rashes or lesions.  ABDOMEN:  Obese, soft, nontender and non-distended with positive bowel  sounds.  No hepatosplenomegaly.  GU:  Normal male genitalia.  RECTAL:  Heme-negative.  EXTREMITIES:  No clubbing,  cyanosis, or edema.  MUSCULOSKELETAL:  No joint deformity or effusions.  NEUROLOGIC:  Alert and oriented x3.  Cranial nerves II-XII grossly intact.  Strength 5/5 in all extremities, symmetric.  Sensation is grossly intact.   LABORATORY AND ACCESSORY CLINICAL DATA:  Chest x-ray reveals mild pulmonary  edema.   EKG:  Normal sinus rhythm with anterolateral ST elevation at 3 mm.   Labs currently pending.   IMPRESSION:  A 60 year old white male with no prior cardiac history, who  presents with acute ST-elevation myocardial infarction.  Code ST-segment-  elevation myocardial infarction protocol has been activated.  We will  initiate a heparin bolus and drip.  We will start metoprolol 5 mg  intravenously q.5 min. x3.  Morphine and nitroglycerin for chest pain.  Oxygen via nasal cannula to keep saturations greater than 90%.  We will  check BMP, CBC, PT, INR, PTT and cardiac biomarkers.      Audery Amel, MD  Electronically Signed     SHG/MEDQ  D:   02/16/2006  T:  02/16/2006  Job:  [id number]

## 2011-02-23 NOTE — Discharge Summary (Signed)
NAMECHRITOPHER, COSTER                ACCOUNT NO.:  [id number]   MEDICAL RECORD NO.:  [id number]          PATIENT TYPE:  INP   LOCATION:  3732                         FACILITY:  [facility]   PHYSICIAN:  Arturo Morton. Riley Kill, M.D. [facility] OF BIRTH:  11/01/1950   DATE OF ADMISSION:  02/16/2006  DATE OF DISCHARGE:  02/19/2006                                 DISCHARGE SUMMARY   DISCHARGE DIAGNOSES:  1.  Coronary artery disease status acute anterior myocardial infarction with      reperfusion.  2.  Small thrombus.  3.  Slight decrease in hematocrit.   SECONDARY DIAGNOSIS:  Obesity.   PROCEDURES:  Left heart catheterization. SVA. SLVG.  Percutaneous  transluminal coronary angioplasty/stent on Feb 16, 2006. Echocardiogram on  Feb 19, 2006.   CONSULTATIONS:  Nutrition consult.   HISTORY OF PRESENT ILLNESS:  This a 60 year old male white male with no  previous cardiac history who had presented to the ER with nausea and  substernal chest pain. Upon arrival to the ER the patient had an EKG  performed which showed anterior lateral ST elevation.  The patient was  immediately taken to the cath lab and Dr. Riley Kill performed a left heart  cardiac catheterization.  Cardiac catheterization showed moderate wall  motion abnormality involving the left anterior descending territory, total  occlusion of the proximal left anterior descending with successful  reperfusion using direct primary angioplasty, scattered luminal  irregularities in circumflex which was a moderate sized vessel with 30%  narrowing. Right coronary arteries: A large caliber vessel providing  posterior descending and posterior lateral branch with about 30% narrowing.  Left main was free of disease, ejection fraction was estimated at 40% . The  patient was also diagnosed with new diabetes while evaluating  his  hemoglobin A1C. He was given diabetic education.  He also had some elevated  liver functioning. LFTs were probably related to his  acute anterior MI.  The  patient was started on cardiac rehabilitation during his inpatient stay  which he tolerated well.  Echocardiogram was also obtained on Feb 19, 2006.  Mobile apical mural thrombus was noted. The patient was started on heparin  and Coumadin was also started.  Dr. Riley Kill stated that the patient would be  on Coumadin for 3 months.  We will discontinue Plavix at 4 weeks.  The  patient was also consented to the IMPROVE IT study.  Dr. Riley Kill was in to  see and examine the patient on 02/21/2006 and deemed the patient stable for  discharge home today.   The patient will be started on Coumadin 2.5 mg p.o. today and will follow up  in the Coumadin clinic at [facility] on Monday. He will follow up with Dr.  Riley Kill in 1 week.   DISCHARGE MEDICATIONS:  1.  Aspirin 325 mg p.o. daily.  2.  Plavix 75 mg p.o. daily.  3.  Nitroglycerin 0.4 mg sublingual as needed.  4.  IMPROVE IT STUDY as directed.  5.  Claritin as previously scheduled.   The patient will also follow up blood work in 6-8 weeks with his LFTs.  He  was discharged home on a low salt, low cholesterol diet.  No driving for 2  weeks no lifting for two weeks   DISCHARGE LABS:  White blood cell count of 8.2 thousand, hemoglobin of 14.8,  hematocrit 42.7, platelet count 296,000. INR 1.0 on May17, 2007. Creatinine  of 1.2, bilirubin of 0.9, alkaline phosphatase of 69, SGOT (AST) 42, SGPT  (ALT) 31, total protein 6.0, albumin 3.3. Hemoglobin A1C was 5.9.  Cholesterol was a total of 130, HDL was 41, LDL was 80, triglycerides were  46. Thyroid-stimulating hormone was 0.954.   Total time of  MP and MG was 45 minutes.     ______________________________  April Humphrey, NP      Arturo Morton. Riley Kill, M.D. [facility]  Electronically Signed    AH/MEDQ  D:  02/21/2006  T:  02/22/2006  Job:  [id number]

## 2011-02-23 NOTE — Assessment & Plan Note (Signed)
[facility]                            CARDIOLOGY OFFICE NOTE   NAME:Vincent [name], Vincent [name]                       MRN:          [id number]  DATE:08/26/2006                            DOB:          [birth year]    Niyam is in for a follow up visit.   To briefly summarize, he is doing quite well. He is not having any chest  pain or shortness of breath. We need to repeat an echocardiogram at the  present time, and if this appears stable we will go ahead and stop his  Coumadin. Likewise he had a non drug eluting platform placed in the  setting of an acute MI.   MEDICATIONS:  Include over the counter Claritin, study drug which is for  hypercholesterolemia, Coumadin as directed, aspirin 81 mg daily,  Metoprolol 50 mg p.o. b.i.d., triamcinolone 0.5 cream b.i.d. and Altace  2.5 mg daily.   PHYSICAL EXAMINATION:  GENERAL: He is an alert and oriented gentleman in  no distress.  VITAL SIGNS: Blood pressure 110/72, pulse 65.  LUNGS: Lung fields are clear.  CARDIOVASCULAR: Cardiac rhythm is regular, there is no significant  murmur, there no extremity edema.   IMPRESSION:  1. Coronary artery disease status post anterior wall myocardial      infarction treated with PTCA and stenting of the left anterior      descending artery.  2. Moderate left ventricular dysfunction with an ejection fraction of      40%, left ventricular mural thrombus.  3. No critical disease of the circumflex or RCA.  4. Hypercholesterolemia on study drug.   PLAN:  1.Return to clinic for a 2D echocardiogram.  2.Return to clinic for an exercise stress-test.  1. Discontinue Coumadin if the echocardiogram does not suggest      increased mural thrombus size.  2. Continue regular exercise.     Arturo Morton. Riley Kill, MD, [facility]  Electronically Signed    TDS/MedQ  DD: 08/26/2006  DT: 08/27/2006  Job #: [id number]

## 2011-02-23 NOTE — Assessment & Plan Note (Signed)
[facility]                              CARDIOLOGY OFFICE NOTE   NAME:[name], Vincent SHIFFLER                       MRN:          [id number]  DATE:05/07/2006                            DOB:          1950/10/29    Vincent [name] presents to the office for a followup visit.  In general, he has  been getting along well.  He had one brief sharp episode of chest pain with  lifting but it was not prolonged nor did it sound cardiac.  His wife has  been keeping him on a strict diet and he has lost a substantial amount of  weight.  He actually feels good.  He had a non-drug-eluting-stent placed and  he came off of Plavix at 1 month in lieu of remaining on Coumadin from the  outset.   PHYSICAL EXAMINATION:  VITAL SIGNS:  The weight is 233 pounds; the blood  pressure 117/80; and the pulse is 69.  LUNGS:  Fields are clear.  CARDIAC:  Reveals no significant murmur.  There is a soft S4 gallop.   The patient underwent exercise tolerance testing on April 08, 2006, and did  well with that.   We plan to keep him on Coumadin for 6 months.  We have scheduled a followup  chest x-ray to follow up on the right upper lobe density that was thought to  represent costochondral junction at the time of his original emergency room  chest film.  We will see him back in followup in 2 months.  He will remain  on Warfarin.                              Arturo Morton. Riley Kill, MD, [facility]    TDS/MedQ  DD:  05/07/2006  DT:  05/07/2006  Job #:  [id number]

## 2011-02-23 NOTE — H&P (Signed)
Vincent [name], Vincent [name]                ACCOUNT NO.:  [id number]   MEDICAL RECORD NO.:  [id number]          PATIENT TYPE:  EMS   LOCATION:  MAJO                         FACILITY:  [facility]   PHYSICIAN:  Rollene Rotunda, MD, FACCDATE OF BIRTH:  [birth year]   DATE OF ADMISSION:  11/13/2006  DATE OF DISCHARGE:                              HISTORY & PHYSICAL   PRIMARY CARE PHYSICIAN:  Dr. Cyndie Mull.   CARDIOLOGIST:  Dr. Riley Kill.   REASON FOR PRESENTATION:  The patient with arm and neck discomfort.   HISTORY OF PRESENT ILLNESS:  The patient is a 60 year old gentleman with  a past history of acute anterior myocardial infarction treated with  stenting in May 2007.  He has done well since that time.  He has seen  Dr. Riley Kill in the office.  He reports two stress perfusion studies  since his catheterization.  I do not have these results.  The patient  was not informed of any abnormalities.  He did have an echocardiogram,  which demonstrated his EF to be 55% with mild hypokinesis of the  periapical wall.   The patient had been in his usual state of health until 2 days ago.  He  started noticing left arm discomfort.  He had never had arm discomfort  before.Marland Kitchen  He describes it as feeling like somebody punched him in his  arm.  It seems to be constant, though he has a hard time deciding  whether it waxes and wanes or disappears.  He does have some  reproducible discomfort when he moves his arm.  He is having a little  bit of trouble moving his arm above his shoulder.  He also has had a  headache for 2 days.  This is reminiscent of symptoms he had prior to  his MI.  He has had some pain in the back of his neck similar to prior  to his previous MI, but he also has some left-sided neck discomfort,  which is unusual and reproducible with movement.  He has not had any  associated nausea, vomiting, diaphoresis.  He has not had any associated  palpitations.  He has not had any presyncope or syncope.  He had  no  shortness of breath and denies PND or orthopnea.  The discomfort in his  arm is at least moderate.  He finally told his wife about it, and they  came to the emergency room today.  He has had no acute findings on his  EKG.  Initial point of care markers are negative.  Other than this, the  patient had been doing relatively well without any cardiovascular  symptoms.   PAST MEDICAL HISTORY:  Acute anterior myocardial infarction in May 2007  (EF at the time of MI 40% with severe anterior akinesis.  The LAD was  totally occluded.  Distal LAD had luminal irregularities with 30%  stenosis in the diagonal, circumflex was moderate size with 30% proximal  stenosis, right coronary artery had a posterolateral branch with 30%  stenosis.  The patient underwent PTCA with a bare metal stent).   ALLERGIES:  None.  MEDICATIONS:  Xanax, metoprolol, aspirin, Zocor (the patient does not  recall the doses).   SOCIAL HISTORY:  The patient has been married for many years.  He works  for [employer].  He does not smoke cigarettes or drink  alcohol.   FAMILY HISTORY:  Contributory for father died of myocardial infarction  at 107.  His sister has had multiple percutaneous coronary procedures,  and she is in her 40s.   REVIEW OF SYSTEMS:  As stated in the HPI, otherwise negative for all  other systems.   PHYSICAL EXAMINATION:  The patient is in no distress.  Blood pressure  108/63, heart rate 69 and regular, afebrile.  HEENT:  Eyes unremarkable.  Pupils equal, round, react to light.  Fundi  visualized, oral mucosa unremarkable.  NECK:  No jugular distension at 45 degrees, carotid upstroke brisk and  symmetric, no bruits, no thyromegaly.  There is some tenderness to  palpation on the left side over the sternocleidomastoid, and there is  some tenderness to rotation of his head.  LYMPHATICS:  No cervical, axillary, inguinal adenopathy.  LUNGS:  Clear to auscultation bilaterally.  BACK:  No  costovertebral angle tenderness.  CHEST:  Unremarkable.  HEART:  PMI not displaced or sustained, S1-S2 within normal limits, no  S3, no S4, no clicks, rubs, no murmurs.  ABDOMEN:  Obese, positive bowel sounds, normal in frequency and pitch,  no bruits, no rebound, guarding or midline pulsatile mass.  No  hepatosplenomegaly, splenomegaly.  SKIN:  No rashes.  No nodules.  EXTREMITIES:  There are 2+ pulses throughout, no edema, cyanosis or  clubbing.  NEUROLOGIC:  Oriented to person, place and time, cranial nerves II-XII  grossly intact, motor grossly intact.   EKG:  Sinus rhythm, rate 69, axis within normal limits, intervals within  normal limits, anteroseptal myocardial infarction, nonspecific  inferolateral T-wave flattening, no old EKGs for comparison.   LABORATORY DATA:  Sodium 136, potassium 4.2, BUN 11, creatinine 0.8.  Other labs pending aside for the point of care markers, which are  negative.   ASSESSMENT/PLAN:  Arm discomfort.  The patient's arm discomfort is  predominately atypical.  It is certainly reproducible with movement.  He  also has some neck discomfort reproducible with movement.  The only  concerning thing is that he has had a headache which he had prior to  previous acute coronary event.  At this point, I am going to put him in  for observation and cycle cardiac enzymes.  Will continue his outpatient  medications.  Will pick a dose of beta blocker.  He needs to have a  review of his most recent stress perfusion study, which I believe was in  November.  I would suggest that if he has negative enzymes and no more  typical symptoms and no change in his EKG that no further cardiovascular  testing would be warranted,  particularly if he has had a recent  negative stress perfusion study.      Rollene Rotunda, MD, [facility]  Electronically Signed     JH/MEDQ  D:  11/13/2006  T:  11/14/2006  Job:  [id number]   cc:   Kari Baars, M.D.

## 2011-06-25 ENCOUNTER — Emergency Department (HOSPITAL_COMMUNITY)
Admission: EM | Admit: 2011-06-25 | Discharge: 2011-06-25 | Disposition: A | Discharge status: Home / Self Care | Payer: Self-pay | Attending: Emergency Medicine | Admitting: Emergency Medicine

## 2011-06-25 ENCOUNTER — Emergency Department (HOSPITAL_COMMUNITY): Payer: Self-pay

## 2011-06-25 DIAGNOSIS — R209 Unspecified disturbances of skin sensation: Secondary | ICD-10-CM | POA: Insufficient documentation

## 2011-06-25 DIAGNOSIS — I2510 Atherosclerotic heart disease of native coronary artery without angina pectoris: Secondary | ICD-10-CM | POA: Insufficient documentation

## 2011-06-25 DIAGNOSIS — R109 Unspecified abdominal pain: Secondary | ICD-10-CM | POA: Insufficient documentation

## 2011-06-25 DIAGNOSIS — I10 Essential (primary) hypertension: Secondary | ICD-10-CM | POA: Insufficient documentation

## 2011-06-25 DIAGNOSIS — R110 Nausea: Secondary | ICD-10-CM | POA: Insufficient documentation

## 2011-06-25 LAB — CBC
HCT: 40.4 % (ref 39.0–52.0)
Hemoglobin: 13.6 g/dL (ref 13.0–17.0)
MCH: 30.2 pg (ref 26.0–34.0)
MCHC: 33.7 g/dL (ref 30.0–36.0)
MCV: 89.8 fL (ref 78.0–100.0)
Platelets: 203 10*3/uL (ref 150–400)
RBC: 4.5 MIL/uL (ref 4.22–5.81)
RDW: 13.7 % (ref 11.5–15.5)
WBC: 7.6 10*3/uL (ref 4.0–10.5)

## 2011-06-25 LAB — URINALYSIS, ROUTINE W REFLEX MICROSCOPIC
Bilirubin Urine: NEGATIVE
Glucose, UA: NEGATIVE mg/dL
Hgb urine dipstick: NEGATIVE
Ketones, ur: NEGATIVE mg/dL
Leukocytes, UA: NEGATIVE
Nitrite: NEGATIVE
Protein, ur: NEGATIVE mg/dL
Specific Gravity, Urine: 1.019 (ref 1.005–1.030)
Urobilinogen, UA: 1 mg/dL (ref 0.0–1.0)
pH: 6 (ref 5.0–8.0)

## 2011-06-25 LAB — DIFFERENTIAL
Basophils Absolute: 0 10*3/uL (ref 0.0–0.1)
Basophils Relative: 0 % (ref 0–1)
Eosinophils Absolute: 0.1 10*3/uL (ref 0.0–0.7)
Eosinophils Relative: 2 % (ref 0–5)
Lymphocytes Relative: 31 % (ref 12–46)
Lymphs Abs: 2.3 10*3/uL (ref 0.7–4.0)
Monocytes Absolute: 0.7 10*3/uL (ref 0.1–1.0)
Monocytes Relative: 9 % (ref 3–12)
Neutro Abs: 4.4 10*3/uL (ref 1.7–7.7)
Neutrophils Relative %: 58 % (ref 43–77)

## 2011-06-25 LAB — BASIC METABOLIC PANEL
BUN: 18 mg/dL (ref 6–23)
CO2: 30 mEq/L (ref 19–32)
Calcium: 9.6 mg/dL (ref 8.4–10.5)
Chloride: 102 mEq/L (ref 96–112)
Creatinine, Ser: 0.92 mg/dL (ref 0.50–1.35)
GFR calc Af Amer: >60 mL/min (ref >60)
GFR calc non Af Amer: >60 mL/min (ref >60)
Glucose, Bld: 144 mg/dL — ABNORMAL HIGH (ref 70–99)
Potassium: 3.9 mEq/L (ref 3.5–5.1)
Sodium: 138 mEq/L (ref 135–145)

## 2011-06-25 LAB — HEPATIC FUNCTION PANEL
ALT: 14 U/L (ref 0–53)
AST: 19 U/L (ref 0–37)
Albumin: 3.7 g/dL (ref 3.5–5.2)
Alkaline Phosphatase: 89 U/L (ref 39–117)
Bilirubin, Direct: <0.1 mg/dL (ref 0.0–0.3)
Total Bilirubin: 0.2 mg/dL — ABNORMAL LOW (ref 0.3–1.2)
Total Protein: 6.8 g/dL (ref 6.0–8.3)

## 2011-06-25 LAB — POCT I-STAT TROPONIN I: Troponin i, poc: 0 ng/mL (ref 0.00–0.08)

## 2011-06-25 IMAGING — CR DG CHEST 2V
2 series · 2 of 2 positions shown · non-contrast
Comparison: [DATE] and prior chest radiographs

CLINICAL DATA: 59-year-old male with right chest pain.

CHEST - 2 VIEW

[w chest pa *]
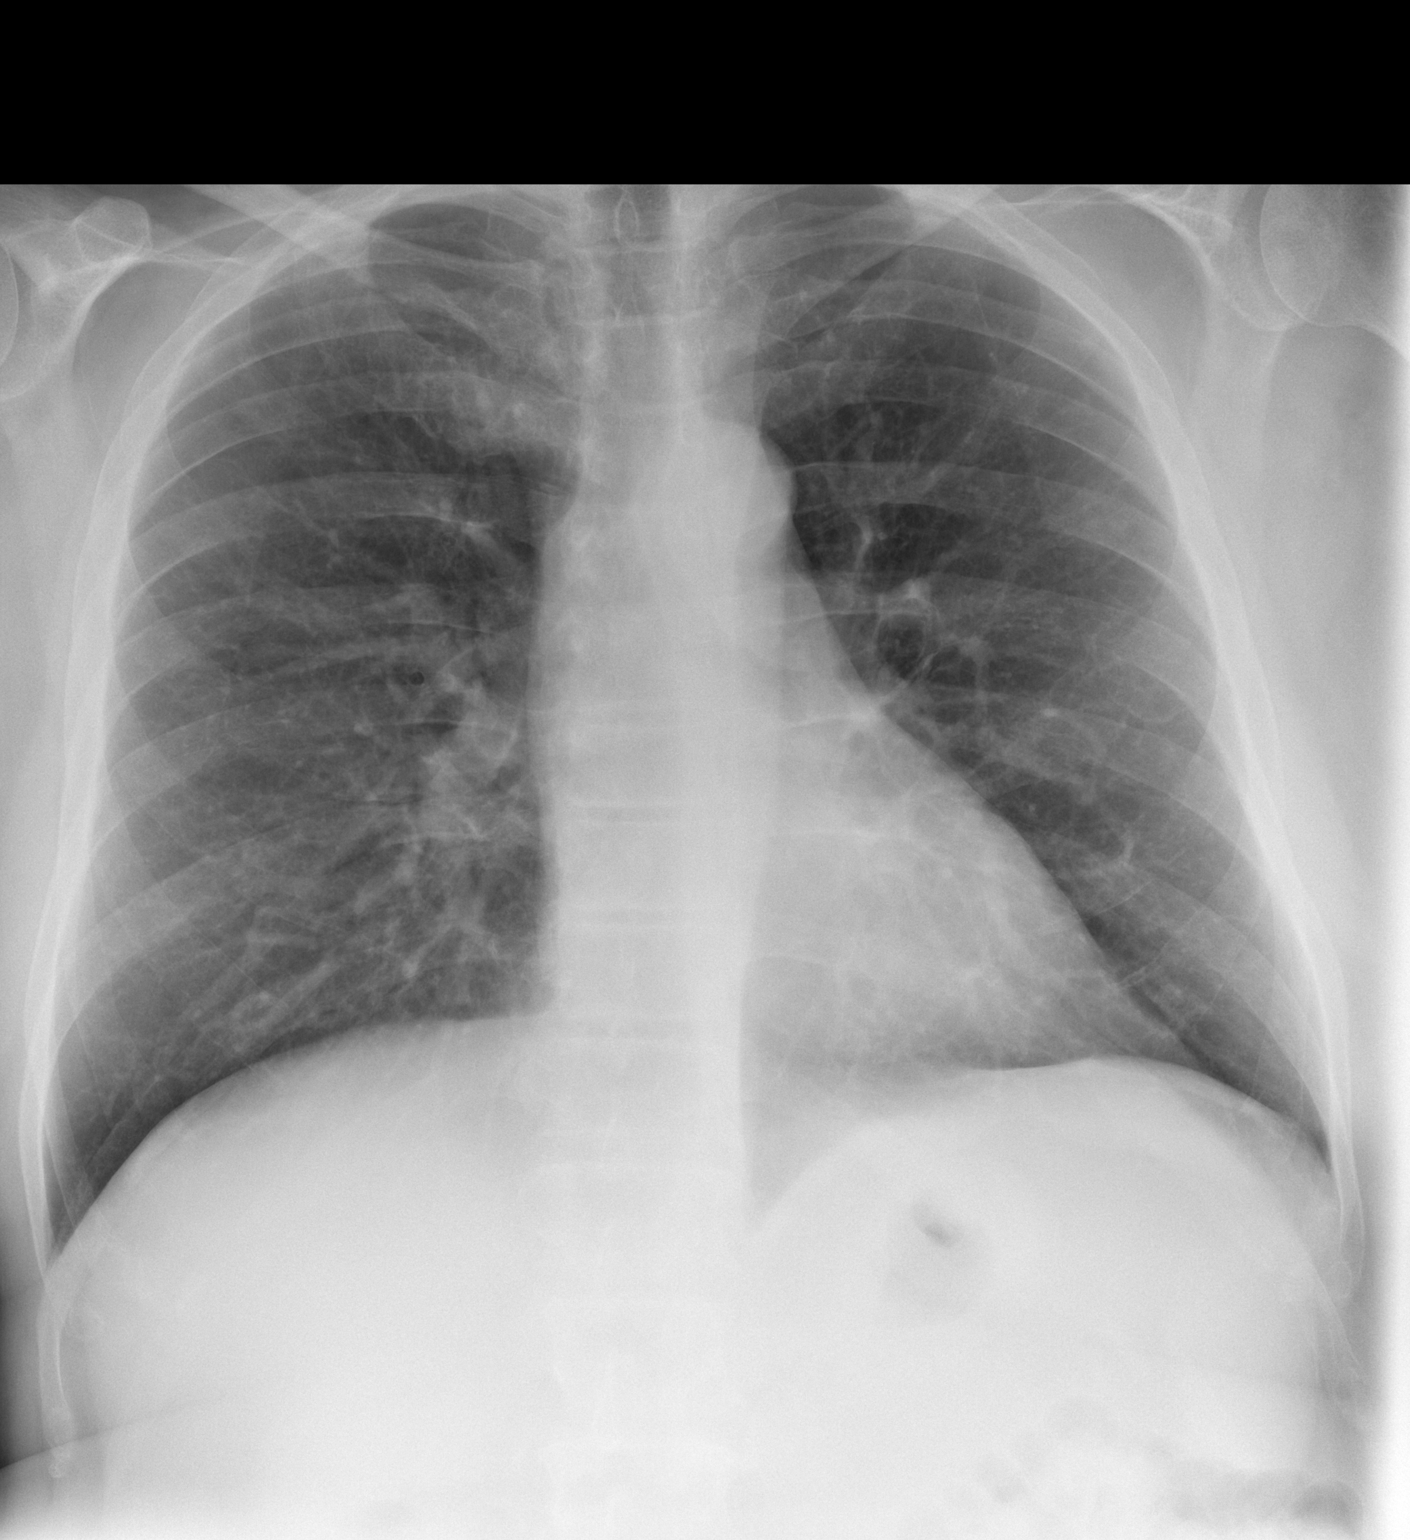

[w chest lat]
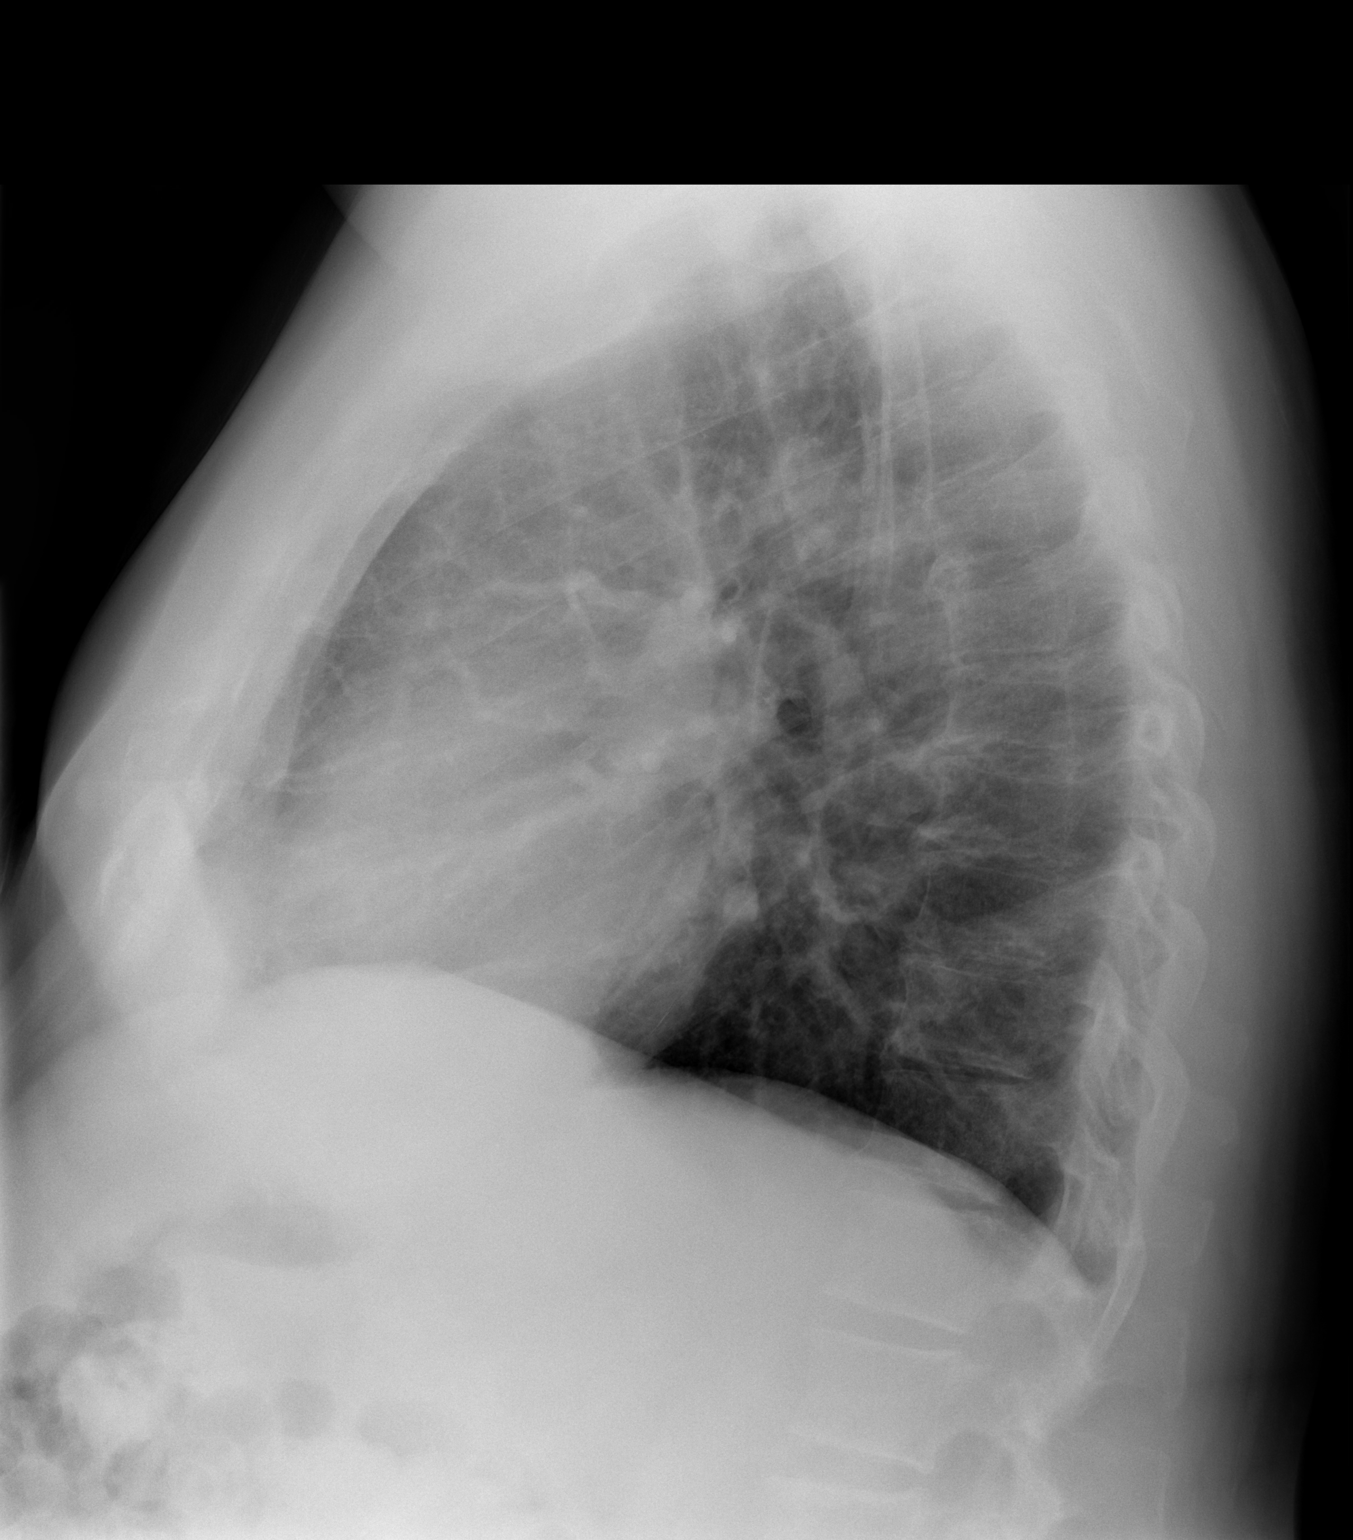

[2 of 2 positions shown; findings below may reference images not displayed]

FINDINGS: The cardiomediastinal silhouette is unremarkable.
There is no evidence of focal airspace disease, pulmonary edema,
pulmonary nodule/mass, pleural effusion, or pneumothorax.
No acute bony abnormalities are identified.
IMPRESSION: No evidence of active cardiopulmonary disease.

## 2011-07-02 LAB — CBC
HCT: 40.1
Hemoglobin: 13.8
MCHC: 34.5
MCV: 87.3
Platelets: 232
RBC: 4.59
RDW: 14.9
WBC: 8.7

## 2011-07-02 LAB — POCT I-STAT, CHEM 8
BUN: 8
Calcium, Ion: 1.03 — ABNORMAL LOW
Chloride: 108
Creatinine, Ser: 0.7
Glucose, Bld: 124 — ABNORMAL HIGH
HCT: 42
Hemoglobin: 14.3
Potassium: 3.4 — ABNORMAL LOW
Sodium: 140
TCO2: 22

## 2011-07-02 LAB — DIFFERENTIAL
Basophils Absolute: 0.1
Basophils Relative: 1
Eosinophils Absolute: 0.2
Eosinophils Relative: 2
Lymphocytes Relative: 26
Lymphs Abs: 2.3
Monocytes Absolute: 1
Monocytes Relative: 11
Neutro Abs: 5.1
Neutrophils Relative %: 59

## 2011-07-02 LAB — POCT CARDIAC MARKERS
CKMB, poc: 1.6
CKMB, poc: <1 — ABNORMAL LOW
Myoglobin, poc: 27.9
Myoglobin, poc: 68.2
Operator id: 277751
Operator id: 277751
Troponin i, poc: <0.05
Troponin i, poc: <0.05

## 2011-07-19 ENCOUNTER — Encounter: Payer: Self-pay | Admitting: Cardiology

## 2011-07-23 ENCOUNTER — Encounter: Payer: Self-pay | Admitting: Cardiology

## 2011-07-23 ENCOUNTER — Ambulatory Visit (INDEPENDENT_AMBULATORY_CARE_PROVIDER_SITE_OTHER): Payer: 59 | Admitting: Cardiology

## 2011-07-23 VITALS — BP 110/70 | HR 66 | Resp 20 | Ht 69.0 in | Wt 266.8 lb

## 2011-07-23 DIAGNOSIS — I10 Essential (primary) hypertension: Secondary | ICD-10-CM

## 2011-07-23 DIAGNOSIS — E785 Hyperlipidemia, unspecified: Secondary | ICD-10-CM

## 2011-07-23 DIAGNOSIS — R55 Syncope and collapse: Secondary | ICD-10-CM

## 2011-07-23 DIAGNOSIS — I2510 Atherosclerotic heart disease of native coronary artery without angina pectoris: Secondary | ICD-10-CM

## 2011-07-23 LAB — D-DIMER, QUANTITATIVE: D-Dimer, Quant: 0.45 ug/mL-FEU (ref 0.00–0.48)

## 2011-07-23 NOTE — Patient Instructions (Signed)
Your physician recommends that you have lab work today: D-dimer and BMP

## 2011-07-24 DIAGNOSIS — R55 Syncope and collapse: Secondary | ICD-10-CM | POA: Insufficient documentation

## 2011-07-24 LAB — BASIC METABOLIC PANEL
BUN: 19 mg/dL (ref 6–23)
CO2: 29 mEq/L (ref 19–32)
Calcium: 9.5 mg/dL (ref 8.4–10.5)
Chloride: 102 mEq/L (ref 96–112)
Creatinine, Ser: 1 mg/dL (ref 0.4–1.5)
GFR: 81.06 mL/min (ref >60.00)
Glucose, Bld: 138 mg/dL — ABNORMAL HIGH (ref 70–99)
Potassium: 4.5 mEq/L (ref 3.5–5.1)
Sodium: 138 mEq/L (ref 135–145)
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
## 2011-07-24 NOTE — Assessment & Plan Note (Signed)
He had an episode, and none since.  What precipitated this is unclear.  He really does not have substrate for SCD, and high grade ventricular arrhythmias.  His EF is nearly normal.  This also did not sound neurally mediated as well.  It is a bit late for an event monitor.  He has been back to driving for nearly a year.  I suggested to him that a sleep study might be worthwhile, but he denies he falls asleep easily.  Therefore, will defer this to Dr. Clelia Croft.

## 2011-07-24 NOTE — Assessment & Plan Note (Signed)
Not currently on a statin 

## 2011-07-24 NOTE — Assessment & Plan Note (Signed)
Last cath in November 2011, without high grade restenosis or new disease.  Will continue medical therapy.

## 2011-07-24 NOTE — Assessment & Plan Note (Signed)
His BP is well controlled 

## 2011-08-09 ENCOUNTER — Telehealth: Payer: Self-pay | Admitting: Cardiology

## 2011-08-09 DIAGNOSIS — R002 Palpitations: Secondary | ICD-10-CM

## 2011-08-09 DIAGNOSIS — G4730 Sleep apnea, unspecified: Secondary | ICD-10-CM

## 2011-08-09 NOTE — Telephone Encounter (Signed)
New message  Pt said someone called him. He was returning your call.

## 2011-08-17 NOTE — Telephone Encounter (Signed)
Left message for pt to call back. Per Dr Riley Kill he would like the pt to have an EVENT monitor, SLEEP STUDY and follow-up appointment in 6 WEEKS.  The pt's BMP and D-dimer were normal.

## 2011-08-20 NOTE — Telephone Encounter (Signed)
Follow up for previous call:  Patient returning nurse call from Friday.

## 2011-08-20 NOTE — Telephone Encounter (Signed)
Called patient and advised need for event monitor and sleep study. Orders sent to [facility].

## 2011-08-22 ENCOUNTER — Telehealth: Payer: Self-pay

## 2011-08-23 NOTE — Telephone Encounter (Signed)
Patient called back and made appt. For Monday 19 @ 10am

## 2011-08-27 ENCOUNTER — Encounter (INDEPENDENT_AMBULATORY_CARE_PROVIDER_SITE_OTHER): Payer: 59

## 2011-08-27 DIAGNOSIS — R002 Palpitations: Secondary | ICD-10-CM

## 2011-08-29 ENCOUNTER — Ambulatory Visit (HOSPITAL_BASED_OUTPATIENT_CLINIC_OR_DEPARTMENT_OTHER): Payer: 59 | Attending: Cardiology | Admitting: Radiology

## 2011-08-29 VITALS — Ht 69.0 in | Wt 270.0 lb

## 2011-08-29 DIAGNOSIS — G4733 Obstructive sleep apnea (adult) (pediatric): Secondary | ICD-10-CM | POA: Insufficient documentation

## 2011-08-29 DIAGNOSIS — R259 Unspecified abnormal involuntary movements: Secondary | ICD-10-CM | POA: Insufficient documentation

## 2011-08-29 DIAGNOSIS — G4730 Sleep apnea, unspecified: Secondary | ICD-10-CM

## 2011-09-12 DIAGNOSIS — G4733 Obstructive sleep apnea (adult) (pediatric): Secondary | ICD-10-CM

## 2011-09-12 DIAGNOSIS — R259 Unspecified abnormal involuntary movements: Secondary | ICD-10-CM

## 2011-09-12 NOTE — Procedures (Signed)
[name], Vincent [name]                ACCOUNT NO.:  [id number]  MEDICAL RECORD NO.:  [id number]          PATIENT TYPE:  OUT  LOCATION:  SLEEP CENTER                 FACILITY:  [facility]  PHYSICIAN:  Barbaraann Share, MD,FCCPDATE OF BIRTH:  1951/[birth year]/03  DATE OF STUDY:  08/29/2011                           NOCTURNAL POLYSOMNOGRAM  REFERRING PHYSICIAN:  Arturo Morton. Riley Kill, MD, [facility]  INDICATION FOR STUDY:  Hypersomnia with sleep apnea.  EPWORTH SLEEPINESS SCORE:  14.  MEDICATIONS:  SLEEP ARCHITECTURE:  The patient had a total sleep time of 311 minutes with no slow-wave sleep and only 52 minutes of REM. Sleep onset latency was normal at 5 minutes, and REM onset was normal at 75 minutes. Sleep efficiency was mildly reduced at 83%.  RESPIRATORY DATA:  The patient was found to have 2 apneas and 61 obstructive hypopneas, giving him an apnea-hypopnea index of 12 events per hour. The events occurred in all body positions and there was moderate snoring throughout. The patient met split night criteria, however, was extremely anxious about wearing CPAP and declined a CPAP trial. This study was continued as a standard NPSG.  OXYGEN DATA:  There was O2 desaturation as low as 77% with the patient's obstructive events.  CARDIAC DATA:  Occasional PVC noted, but no clinically significant arrhythmias were seen.  MOVEMENT-PARASOMNIA:  The patient had a 116 periodic limb movements, with only 2 per hour, resulting in arousal or awakening. There were no abnormal behaviors noted.  IMPRESSIONS-RECOMMENDATIONS: 1. Mild obstructive sleep apnea/hypopnea syndrome with an AHI of 12     events per hour and O2 desaturation as low as 77%.  Treatment for     this degree of sleep apnea can include a trial of weight loss     alone, upper airway surgery, dental appliance, and also CPAP.  If     the decision is made to treat the patient's sleep apnea with CPAP,     he will need aggressive desensitization due to his  underlying     anxiety. 2. Occasional PVC noted, but no clinically significant arrhythmias     were seen. 3. Large numbers of periodic limb movements, but only very mild sleep     disruption.  These may be associated     with the patient's sleep-disordered breathing, however, cannot     exclude a concomitant primary movement disorder of sleep.  Clinical     correlation is suggested.     Barbaraann Share, MD,FCCP Diplomate, American Board of Sleep Medicine Electronically Signed    [facility]MED[facility]  D:  09/12/2011 07:56:14  T:  09/12/2011 16:10:96  Job:  [id number]

## 2011-10-17 ENCOUNTER — Ambulatory Visit: Payer: 59 | Admitting: Cardiology

## 2011-11-26 ENCOUNTER — Ambulatory Visit (INDEPENDENT_AMBULATORY_CARE_PROVIDER_SITE_OTHER): Payer: 59 | Admitting: Cardiology

## 2011-11-26 ENCOUNTER — Encounter: Payer: Self-pay | Admitting: Cardiology

## 2011-11-26 VITALS — BP 102/68 | HR 72 | Wt 269.0 lb

## 2011-11-26 DIAGNOSIS — R55 Syncope and collapse: Secondary | ICD-10-CM

## 2011-11-26 DIAGNOSIS — G4733 Obstructive sleep apnea (adult) (pediatric): Secondary | ICD-10-CM

## 2011-11-26 DIAGNOSIS — R609 Edema, unspecified: Secondary | ICD-10-CM

## 2011-11-26 DIAGNOSIS — I2510 Atherosclerotic heart disease of native coronary artery without angina pectoris: Secondary | ICD-10-CM

## 2011-11-26 NOTE — Patient Instructions (Signed)
Your physician recommends that you schedule a follow-up appointment in: 4 MONTHS  Your physician recommends that you continue on your current medications as directed. Please refer to the Current Medication list given to you today.   

## 2011-11-26 NOTE — Progress Notes (Signed)
HPI: Long discussion with patient today.  Sleep apnea report reviewed in detail  He saw Dr. Clelia Croft and wants to lose weight.  We also reviewed his event monitor.  He thinks he passed out due to sugar previously and that episode is now more than a year in the past.  No recent syncope or presyncope.    Current Outpatient Prescriptions  Medication Sig Dispense Refill  . aspirin 81 MG tablet Take 81 mg by mouth daily.        . clonazePAM (KLONOPIN) 1 MG tablet Take 1 mg by mouth at bedtime as needed.        . furosemide (LASIX) 40 MG tablet Take 40 mg by mouth 2 (two) times daily.        Marland Kitchen loratadine (CLARITIN) 10 MG tablet Take 10 mg by mouth daily.      . meloxicam (MOBIC) 7.5 MG tablet Take 7.5 mg by mouth as needed.        . metFORMIN (GLUCOPHAGE) 500 MG tablet Take 500 mg by mouth daily with breakfast.        . metoprolol (LOPRESSOR) 50 MG tablet Take 50 mg by mouth 2 (two) times daily.        . nitroGLYCERIN (NITROSTAT) 0.4 MG SL tablet Place 0.4 mg under the tongue every 5 (five) minutes as needed.        . NON FORMULARY Study drug improve       . ramipril (ALTACE) 2.5 MG tablet Take 2.5 mg by mouth daily.        . traMADol (ULTRAM) 50 MG tablet Take 50 mg by mouth every 6 (six) hours as needed.        Allergies  Allergen Reactions  . Penicillins     Past Medical History  Diagnosis Date  . Coronary artery disease   . Hypertension   . Hyperlipidemia   . Hemorrhoids   . Diverticulosis     mild  . Hx of colonic polyps   . Left ventricular dysfunction     hx of    Past Surgical History  Procedure Date  . Angioplasty     and bare metal stent placement    Family History  Problem Relation Age of Onset  . Coronary artery disease      positive family hx of    History   Social History  . Marital Status: Married    Spouse Name: N/A    Number of Children: N/A  . Years of Education: N/A   Occupational History  . full time [employer] manager   Social History Main  Topics  . Smoking status: Former Games developer  . Smokeless tobacco: Not on file  . Alcohol Use: No  . Drug Use: No  . Sexually Active: Not on file   Other Topics Concern  . Not on file   Social History Narrative  . No narrative on file    ROS: Please see the HPI.  All other systems reviewed and negative.  PHYSICAL EXAM:  BP 102/68  Pulse 72  Wt 269 lb (122.018 kg)  General: Well developed,  obese, in no acute distress. Head:  Normocephalic and atraumatic. Neck: no JVD Lungs: Clear to auscultation and percussion. Heart: Normal S1 and S2.  No murmur, rubs or gallops. Paradoxical P2.   Abdomen:  Normal bowel sounds; soft; non tender; no organomegaly.  Obese Pulses: Pulses normal in all 4 extremities. Extremities: No clubbing or cyanosis. No edema. Neurologic: Alert and  oriented x 3.  EKG:  .NSR.  Low voltage QRS.  Otherwise unremarkable.    ASSESSMENT AND PLAN:

## 2011-11-29 ENCOUNTER — Other Ambulatory Visit: Payer: Self-pay | Admitting: Cardiology

## 2011-11-29 DIAGNOSIS — G4733 Obstructive sleep apnea (adult) (pediatric): Secondary | ICD-10-CM | POA: Insufficient documentation

## 2011-11-29 NOTE — Assessment & Plan Note (Signed)
No current cardiac symptoms.  Continue beta blockers and ASA.

## 2011-11-29 NOTE — Assessment & Plan Note (Signed)
No recurrent events.  One month event monitor demonstrates no significant arrhythmias.  I encouraged him to follow through on recommendations regarding sleep apnea.

## 2011-11-29 NOTE — Assessment & Plan Note (Signed)
Remains on furosemide.  Having labs done with Dr. Clelia Croft.

## 2011-11-29 NOTE — Assessment & Plan Note (Signed)
Sleep study 08/29/11 read as 12 events per hour with desat to 77%.  Read as mild.  His preference was for weight loss, but I am a bit cautious about his ability to do this, and I expressed to him the importance of the diagnosis, and its impact.

## 2011-11-30 ENCOUNTER — Encounter: Payer: Self-pay | Admitting: Cardiology

## 2012-02-18 ENCOUNTER — Encounter (INDEPENDENT_AMBULATORY_CARE_PROVIDER_SITE_OTHER): Payer: 59

## 2012-02-18 DIAGNOSIS — R0989 Other specified symptoms and signs involving the circulatory and respiratory systems: Secondary | ICD-10-CM

## 2012-03-17 ENCOUNTER — Ambulatory Visit (INDEPENDENT_AMBULATORY_CARE_PROVIDER_SITE_OTHER): Payer: 59 | Admitting: Cardiology

## 2012-03-17 ENCOUNTER — Encounter: Payer: Self-pay | Admitting: Cardiology

## 2012-03-17 VITALS — BP 110/64 | HR 64 | Ht 69.0 in | Wt 267.4 lb

## 2012-03-17 DIAGNOSIS — G4733 Obstructive sleep apnea (adult) (pediatric): Secondary | ICD-10-CM

## 2012-03-17 DIAGNOSIS — I2510 Atherosclerotic heart disease of native coronary artery without angina pectoris: Secondary | ICD-10-CM

## 2012-03-17 DIAGNOSIS — E785 Hyperlipidemia, unspecified: Secondary | ICD-10-CM

## 2012-03-17 DIAGNOSIS — I10 Essential (primary) hypertension: Secondary | ICD-10-CM

## 2012-03-17 NOTE — Progress Notes (Signed)
HPI:  Is doing well. The lesions on his lateral leg have healed burn stages of healing. He's not having much in the way of chest pain. He tries to avoid them really heavy barrels at work. 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  He has not lost much weight.    Current Outpatient Prescriptions  Medication Sig Dispense Refill  . aspirin 81 MG tablet Take 81 mg by mouth daily.        . clonazePAM (KLONOPIN) 1 MG tablet Take 1 mg by mouth at bedtime as needed.        . furosemide (LASIX) 40 MG tablet Take 40 mg by mouth 2 (two) times daily.        Marland Kitchen loratadine (CLARITIN) 10 MG tablet Take 10 mg by mouth daily.      . meloxicam (MOBIC) 7.5 MG tablet Take 7.5 mg by mouth 2 (two) times daily as needed.       . metFORMIN (GLUCOPHAGE) 500 MG tablet Take 500 mg by mouth daily with breakfast.        . metoprolol (LOPRESSOR) 50 MG tablet take 1 tablet by mouth twice a day  60 tablet  11  . nitroGLYCERIN (NITROSTAT) 0.4 MG SL tablet Place 0.4 mg under the tongue every 5 (five) minutes as needed.        . NON FORMULARY Study drug improve       . ramipril (ALTACE) 2.5 MG tablet Take 2.5 mg by mouth daily.        . traMADol (ULTRAM) 50 MG tablet Take 50 mg by mouth every 6 (six) hours as needed.        Allergies  Allergen Reactions  . Penicillins     Past Medical History  Diagnosis Date  . Coronary artery disease   . Hypertension   . Hyperlipidemia   . Hemorrhoids   . Diverticulosis     mild  . Hx of colonic polyps   . Left ventricular dysfunction     hx of    Past Surgical History  Procedure Date  . Angioplasty     and bare metal stent placement    Family History  Problem Relation Age of Onset  . Coronary artery disease      positive family hx of    History   Social History  . Marital Status: Married    Spouse Name: N/A    Number of Children: N/A  . Years of Education: N/A   Occupational History  . full time [employer] manager   Social History Main Topics    . Smoking status: Never Smoker   . Smokeless tobacco: Not on file  . Alcohol Use: No  . Drug Use: No  . Sexually Active: Not on file   Other Topics Concern  . Not on file   Social History Narrative  . No narrative on file    ROS: Please see the HPI.  All other systems reviewed and negative.  PHYSICAL EXAM:  BP 110/64  Pulse 64  Ht 5\' 9"  (1.753 m)  Wt 267 lb 6.4 oz (121.292 kg)  BMI 39.49 kg/m2  General: Well developed, well nourished, in no acute distress.  Overweight.   Head:  Normocephalic and atraumatic. Neck: no JVD Lungs: Clear to auscultation and percussion. Heart: Normal S1 and S2.  No murmur, rubs or gallops.  Pulses: Pulses normal in all 4 extremities. Extremities: No clubbing or cyanosis. Trace edema.  Lateral lesions on both  legs, drying up, and scaly.   Neurologic: Alert and oriented x 3.  EKG:  NSR.  Low voltage QRS.  Nonspecific T flattening.    ASSESSMENT AND PLAN:

## 2012-03-17 NOTE — Assessment & Plan Note (Signed)
See prior reports.

## 2012-03-17 NOTE — Assessment & Plan Note (Signed)
Currently on study drug  (improve IT)

## 2012-03-17 NOTE — Patient Instructions (Signed)
Your physician wants you to follow-up in: 6 MONTHS You will receive a reminder letter in the mail two months in advance. If you don't receive a letter, please call our office to schedule the follow-up appointment. 

## 2012-03-17 NOTE — Assessment & Plan Note (Signed)
Controlled.  Had recent research labs.

## 2012-03-17 NOTE — Assessment & Plan Note (Addendum)
No current chest pain.  Continue medical therapy.   

## 2012-03-24 ENCOUNTER — Emergency Department (HOSPITAL_COMMUNITY)
Admission: EM | Admit: 2012-03-24 | Discharge: 2012-03-24 | Disposition: A | Discharge status: Home / Self Care | Payer: 59 | Source: Home / Self Care | Attending: Family Medicine | Admitting: Family Medicine

## 2012-03-24 ENCOUNTER — Emergency Department (INDEPENDENT_AMBULATORY_CARE_PROVIDER_SITE_OTHER): Payer: 59

## 2012-03-24 ENCOUNTER — Encounter (HOSPITAL_COMMUNITY): Payer: Self-pay | Admitting: Emergency Medicine

## 2012-03-24 DIAGNOSIS — M19019 Primary osteoarthritis, unspecified shoulder: Secondary | ICD-10-CM

## 2012-03-24 DIAGNOSIS — S8000XA Contusion of unspecified knee, initial encounter: Secondary | ICD-10-CM

## 2012-03-24 DIAGNOSIS — S8001XA Contusion of right knee, initial encounter: Secondary | ICD-10-CM

## 2012-03-24 IMAGING — CR DG SHOULDER 2+V*R*
4 series · 4 of 4 positions shown · non-contrast
Comparison: [DATE]

CLINICAL DATA: Fall, pain.

RIGHT SHOULDER - 2+ VIEW

[view not recorded (1 of 4)]
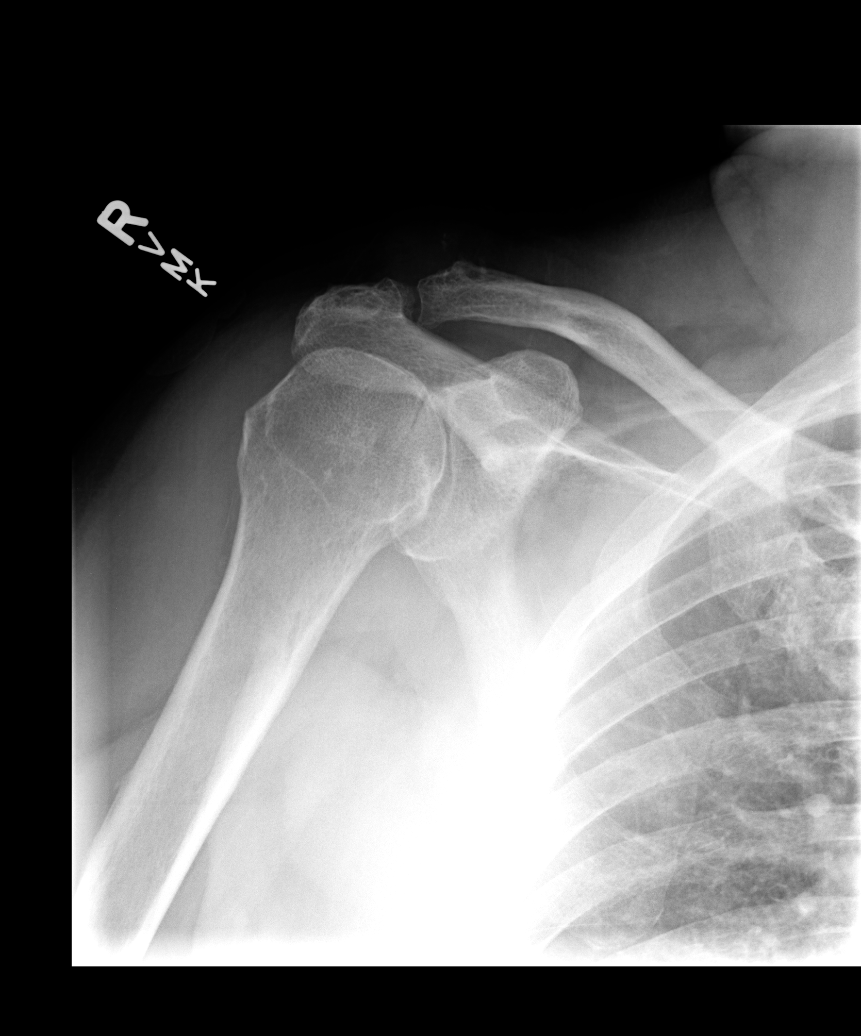

[view not recorded (2 of 4)]
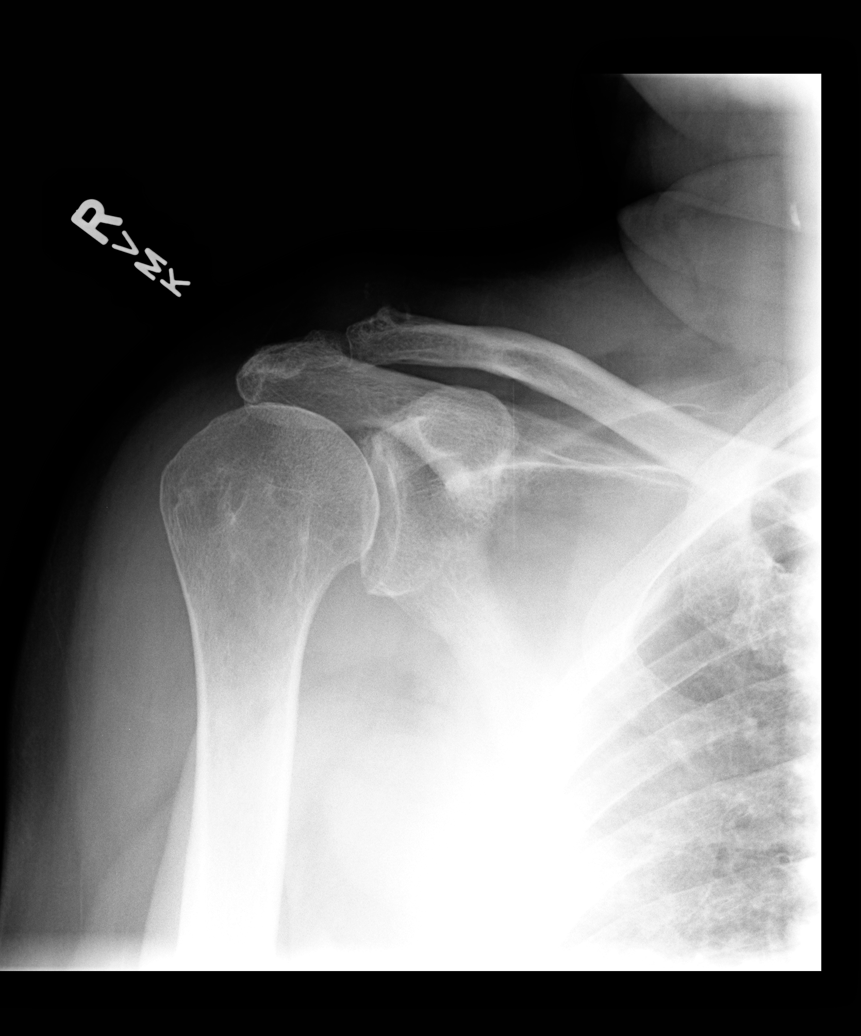

[view not recorded (3 of 4)]
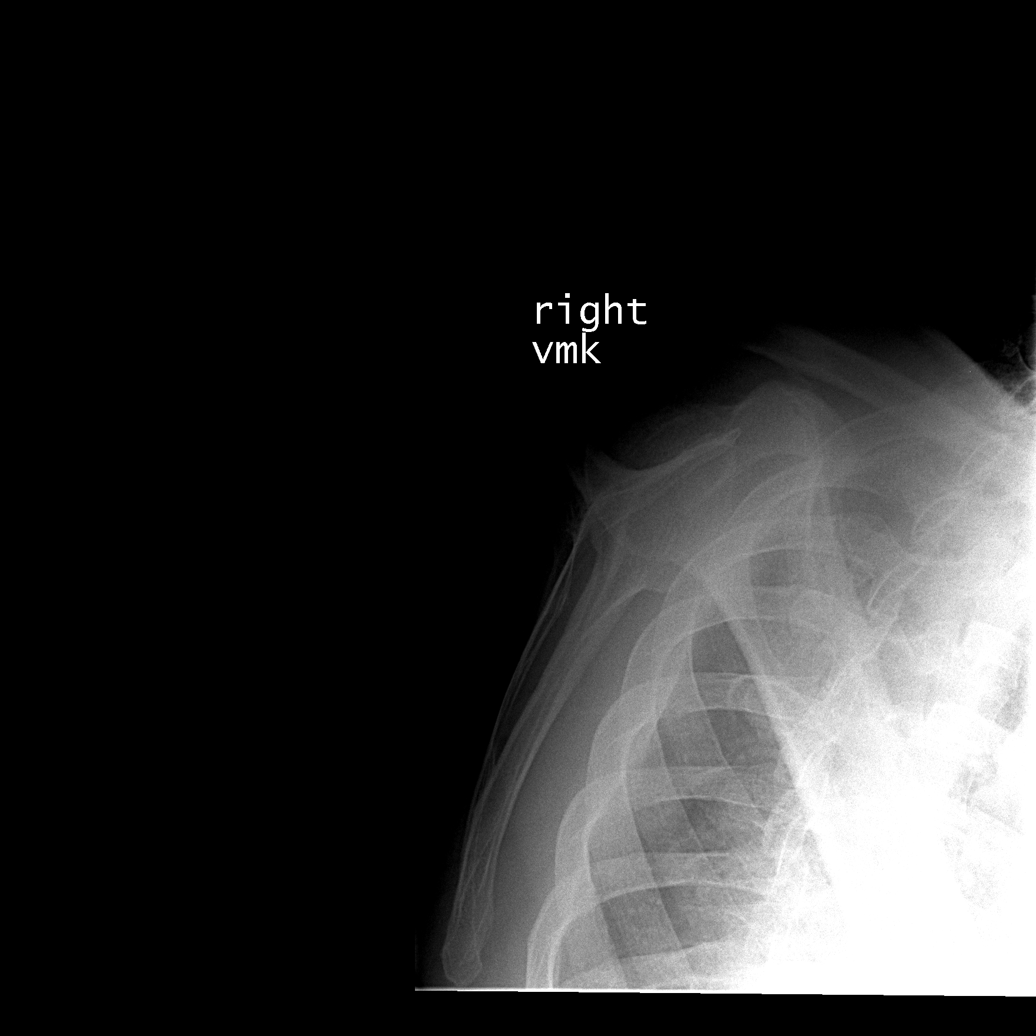

[view not recorded (4 of 4)]
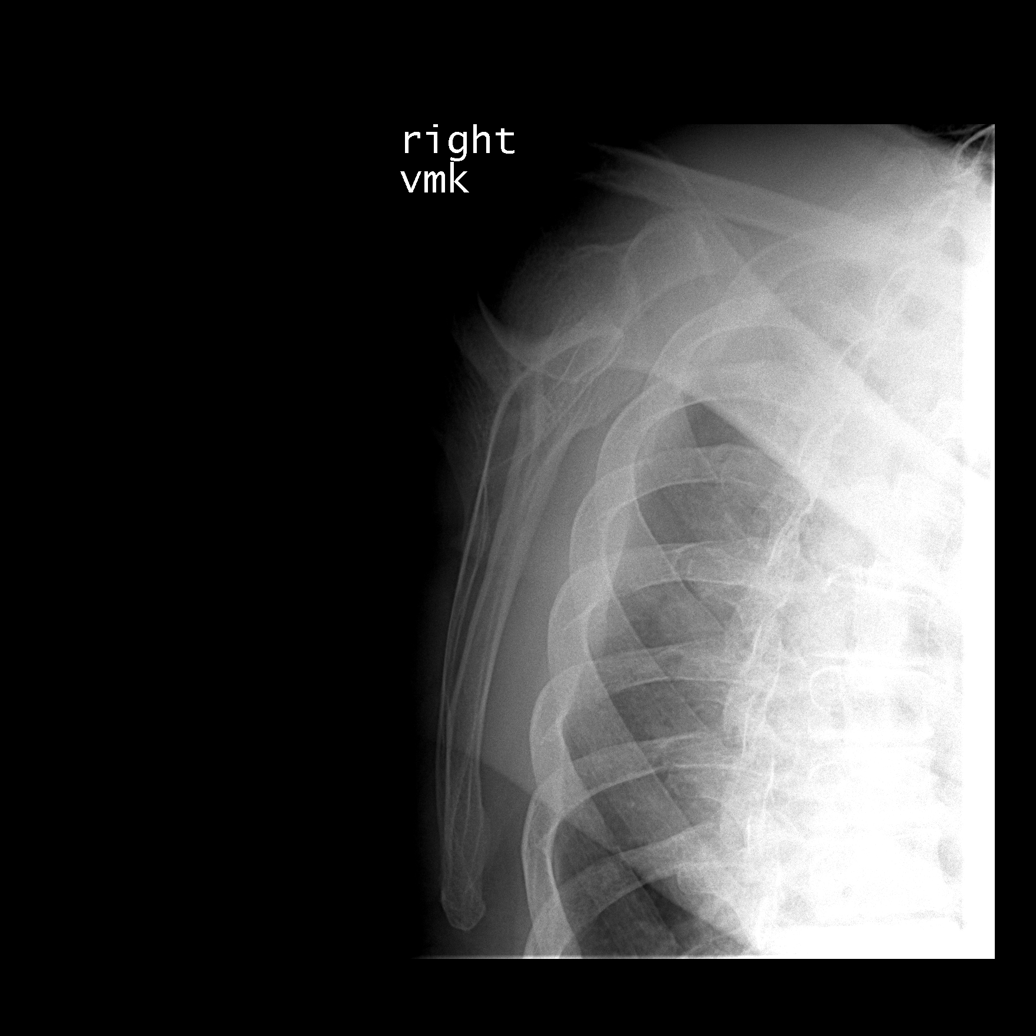

[4 of 4 positions shown; findings below may reference images not displayed]

FINDINGS: Degenerative changes in the right AC joint.  No acute
bony abnormality.  Specifically, no fracture, subluxation, or
dislocation.  Soft tissues are intact.
IMPRESSION: No acute bony abnormality.

## 2012-03-24 IMAGING — CR DG KNEE COMPLETE 4+V*R*
4 series · 4 of 4 positions shown · non-contrast
Comparison: [DATE]

CLINICAL DATA: Fall, right knee pain.

RIGHT KNEE - COMPLETE 4+ VIEW

[view not recorded (1 of 4)]
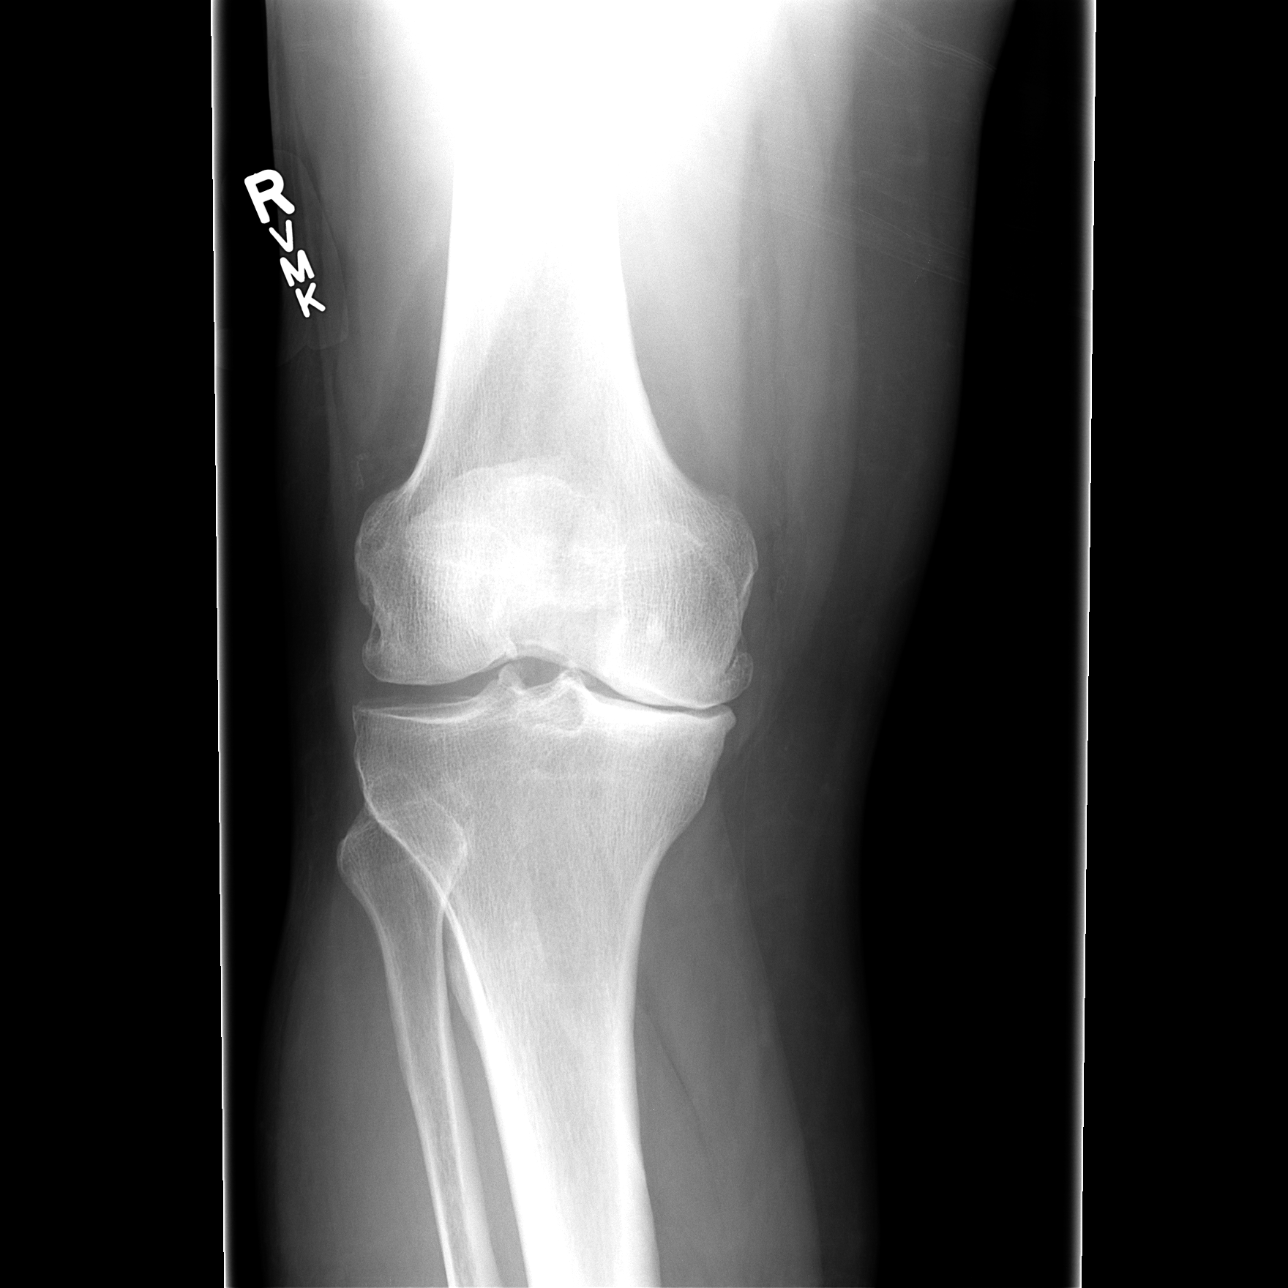

[view not recorded (2 of 4)]
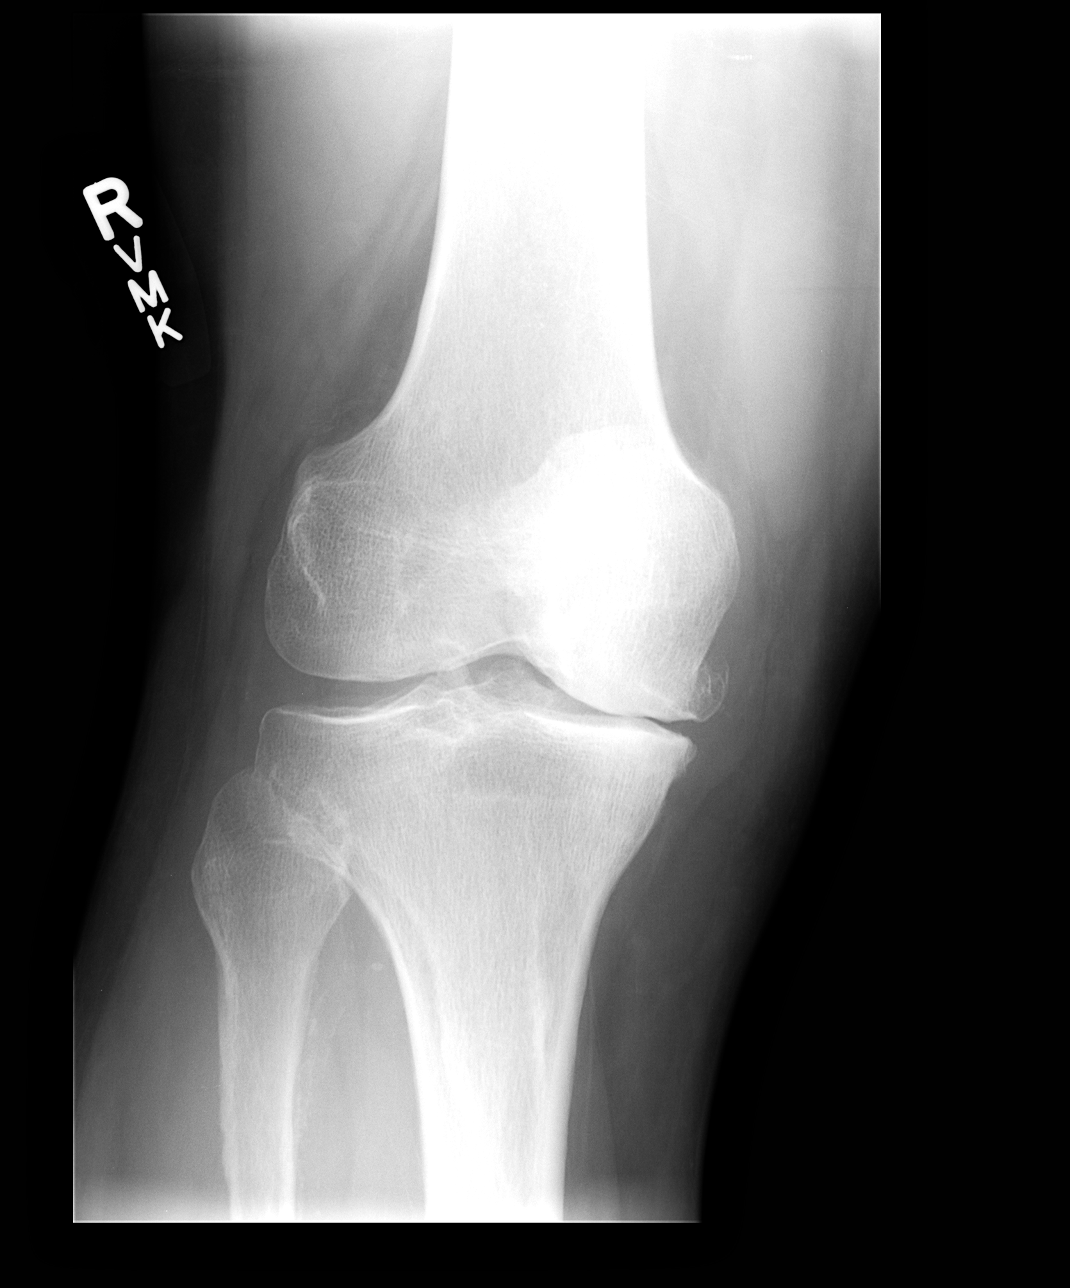

[view not recorded (3 of 4)]
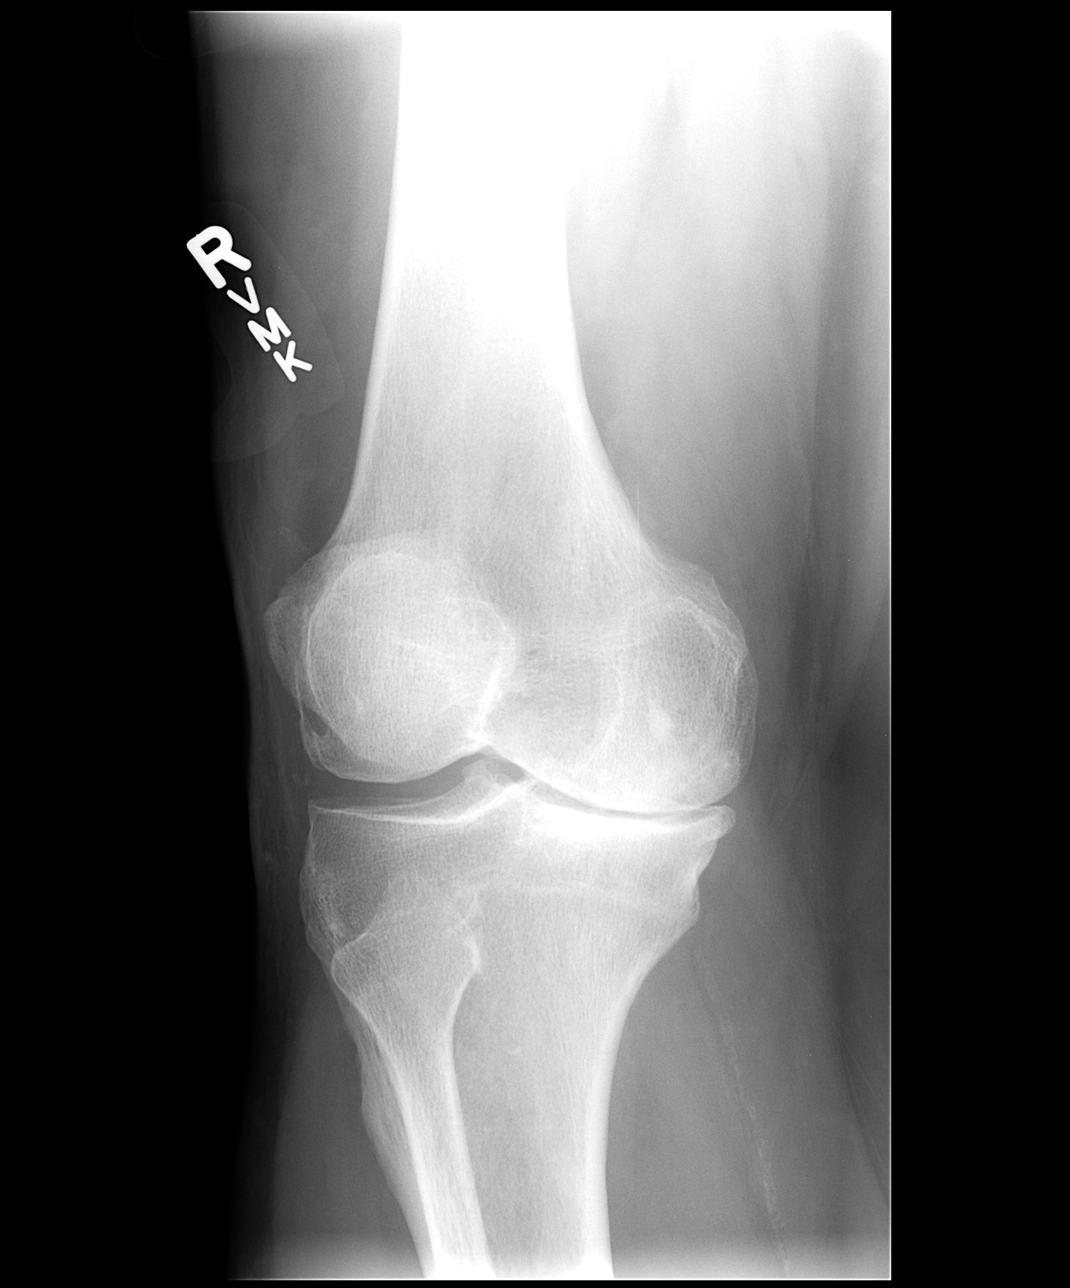

[view not recorded (4 of 4)]
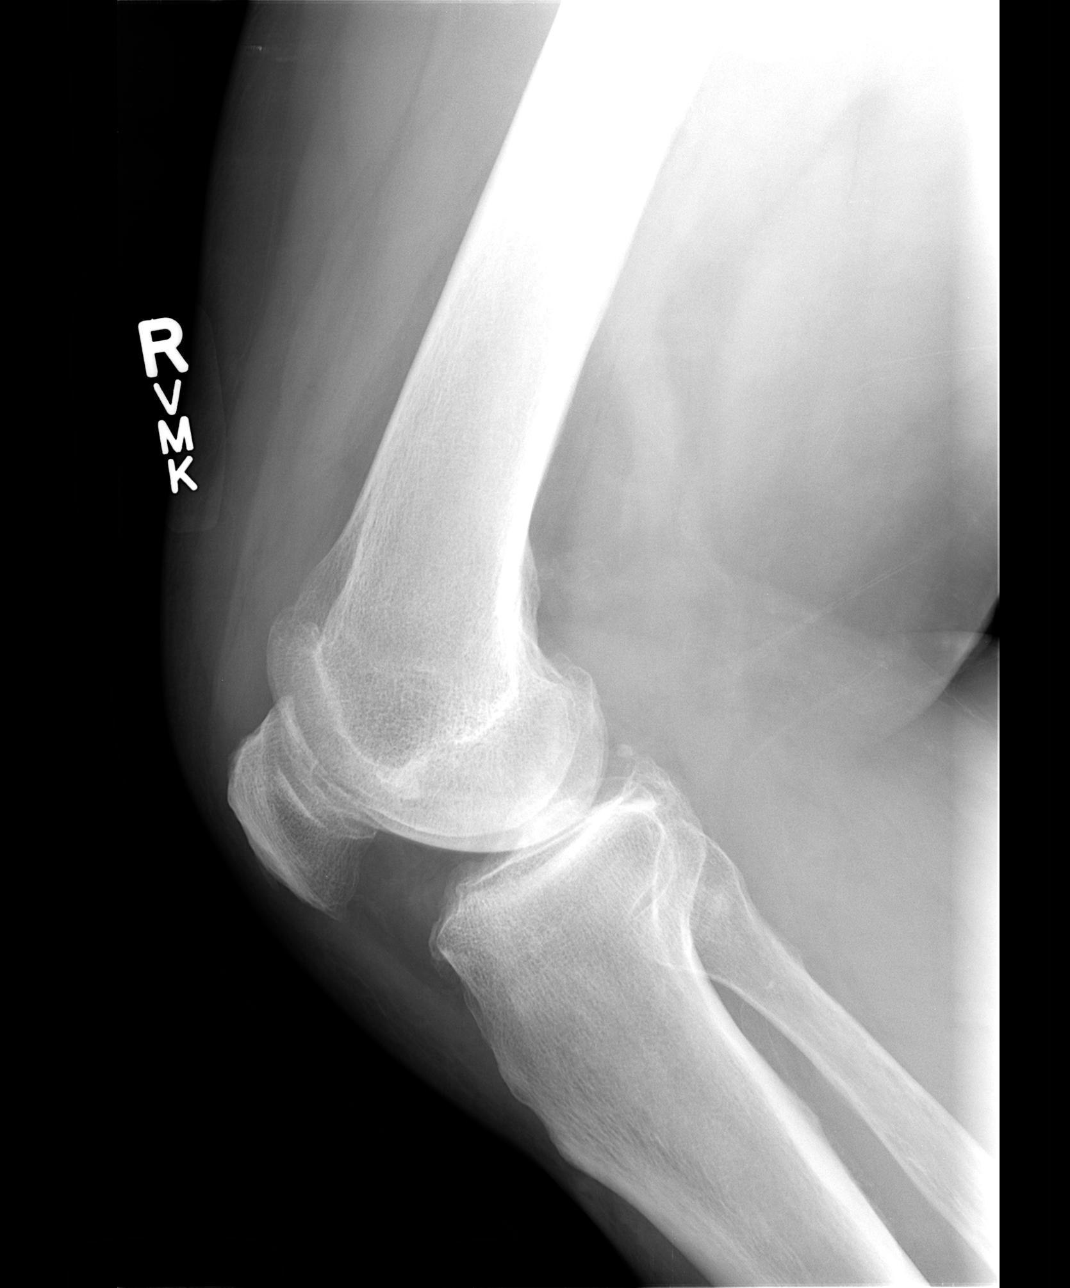

[4 of 4 positions shown; findings below may reference images not displayed]

FINDINGS: Advanced degenerative changes in the right knee, most
pronounced in the medial compartment. No acute bony abnormality.
Specifically, no fracture, subluxation, or dislocation.  Soft
tissues are intact.  No joint effusion.
IMPRESSION: Advanced degenerative changes in the medial compartment. No acute
bony abnormality.

## 2012-03-24 MED ORDER — CEPHALEXIN 500 MG PO CAPS: 500.0000 mg | ORAL_CAPSULE | Freq: Two times a day (BID) | ORAL | Status: DC

## 2012-03-24 MED ORDER — HYDROCODONE-ACETAMINOPHEN 5-325 MG PO TABS
ORAL_TABLET | ORAL | Status: AC
  Filled 2012-03-24: qty 1

## 2012-03-24 MED ORDER — MELOXICAM 7.5 MG PO TABS: 7.5000 mg | ORAL_TABLET | Freq: Two times a day (BID) | ORAL | Status: DC | PRN

## 2012-03-24 MED ORDER — TRAMADOL HCL 50 MG PO TABS: 50.0000 mg | ORAL_TABLET | Freq: Three times a day (TID) | ORAL | Status: DC | PRN

## 2012-03-24 MED ORDER — HYDROCODONE-ACETAMINOPHEN 5-325 MG PO TABS
1.0000 | ORAL_TABLET | Freq: Once | ORAL | Status: AC
  Administered 2012-03-24: 1 via ORAL

## 2012-03-24 MED ORDER — TRAMADOL HCL 50 MG PO TABS: 50.0000 mg | ORAL_TABLET | Freq: Three times a day (TID) | ORAL | Status: AC | PRN

## 2012-03-24 MED ORDER — IBUPROFEN 600 MG PO TABS
600.0000 mg | ORAL_TABLET | Freq: Three times a day (TID) | ORAL | Status: DC | PRN
Start: 2012-03-24 — End: 2012-03-24

## 2012-03-24 NOTE — Discharge Instructions (Signed)
There is no signs of bone injury or fractures in your x-rays. She do have degenerative changes (wear and tear changes) in your knee and your right shoulder joints.  Take the prescribed medications as instructed. Follow up with your primary care provider or orthopedic Dr. number provided above if persistent or worsening symptoms.

## 2012-03-24 NOTE — ED Notes (Signed)
Today he fell on floor and hit his right leg and right shoulder. Lost balance. Lot of pain on rt leg and rt shoulder. He denies losing counsciousness.

## 2012-03-30 NOTE — ED Provider Notes (Signed)
History     CSN: [id number]  Arrival date & time 03/24/12  4098   First MD Initiated Contact with Patient 03/24/12 1911      Chief Complaint  Patient presents with  . Fall    (Consider location/radiation/quality/duration/timing/severity/associated sxs/prior treatment) HPI Comments: 61 y/o male with h/o CAD and DM among other co morbidities. Here c/o right shoulder and right knee pain after tripping losing balance and falling to the floor earlier today. Denies chest pain, shortness of breath, syncope or palpitation prior his fall. States he hit his shoulder and leg with the wall on his way down to the floor. Denies head trauma. Denies altered mentation or losing consciousness. Able to drive and walk with discomfort, came here by self states he "just feels sore". Has not taken any medication for his pain. No skin lacerations or bruisings.    Past Medical History  Diagnosis Date  . Coronary artery disease   . Hypertension   . Hyperlipidemia   . Hemorrhoids   . Diverticulosis     mild  . Hx of colonic polyps   . Left ventricular dysfunction     hx of    Past Surgical History  Procedure Date  . Angioplasty     and bare metal stent placement    Family History  Problem Relation Age of Onset  . Coronary artery disease      positive family hx of    History  Substance Use Topics  . Smoking status: Never Smoker   . Smokeless tobacco: Not on file  . Alcohol Use: No      Review of Systems  Constitutional: Negative for fever and chills.       10 systems reviewed and  pertinent negative and positive symptoms are as per HPI.     Cardiovascular: Negative for palpitations.  Genitourinary:       No incontinence.   Musculoskeletal:       As per HPI  Skin: Negative for wound.  Neurological: Negative for dizziness, weakness, numbness and headaches.  All other systems reviewed and are negative.    Allergies  Penicillins  Home Medications   Current Outpatient Rx  Name  Route Sig Dispense Refill  . ASPIRIN 81 MG PO TABS Oral Take 81 mg by mouth daily.      Marland Kitchen CLONAZEPAM 1 MG PO TABS Oral Take 1 mg by mouth at bedtime as needed.      . FUROSEMIDE 40 MG PO TABS Oral Take 40 mg by mouth 2 (two) times daily.      Marland Kitchen LORATADINE 10 MG PO TABS Oral Take 10 mg by mouth daily.    . MELOXICAM 7.5 MG PO TABS Oral Take 1 tablet (7.5 mg total) by mouth 2 (two) times daily as needed. 60 tablet 0  . METFORMIN HCL 500 MG PO TABS Oral Take 500 mg by mouth daily with breakfast.      . METOPROLOL TARTRATE 50 MG PO TABS  take 1 tablet by mouth twice a day 60 tablet 11  . NITROGLYCERIN 0.4 MG SL SUBL Sublingual Place 0.4 mg under the tongue every 5 (five) minutes as needed.      . NON FORMULARY  Study drug improve     . RAMIPRIL 2.5 MG PO TABS Oral Take 2.5 mg by mouth daily.      . TRAMADOL HCL 50 MG PO TABS Oral Take 1 tablet (50 mg total) by mouth every 8 (eight) hours as needed for pain.  15 tablet 0    BP 129/71  Pulse 70  Temp 97.6 F (36.4 C) (Oral)  Resp 20  SpO2 100%  Physical Exam  Nursing note and vitals reviewed. Constitutional: He is oriented to person, place, and time. He appears well-developed and well-nourished. No distress.  HENT:  Head: Normocephalic and atraumatic.  Eyes: EOM are normal. Pupils are equal, round, and reactive to light.  Neck: Neck supple.  Cardiovascular: Normal rate and regular rhythm.   Pulmonary/Chest: Breath sounds normal. He has no wheezes. He has no rales.  Musculoskeletal:       Right shoulder: prominent AC joint impres deformed and firm to palpation. No acute changes of the skin above. No erythema, swelling, bruising, abrasions or lacerations. Able to elevate/abduct and adduct shoulder fairly but reported diffuse discomfort with movement. Right knee: No obvious deformity diffuse tenderness to palpation. No effusion. Weight bearing. No hyperlaxity. Negative meniscal maneuvers and drawer test.   Neurological: He is alert and  oriented to person, place, and time.    ED Course  Procedures (including critical care time)  Labs Reviewed - No data to display No results found.   1. Osteoarthritis of acromioclavicular joint   2. Contusion of right knee       MDM  No acute bone injury on X-rays. AC severe osteoarthritis accounts for deformity on Physical exam. Right knee DJD on Xrays. Treated symptomatically refilling previously prescribed tramadol and Meloxicam.        Sharin Grave, MD 03/30/12 2130

## 2012-11-26 ENCOUNTER — Other Ambulatory Visit: Payer: Self-pay | Admitting: *Deleted

## 2012-11-26 MED ORDER — METOPROLOL TARTRATE 50 MG PO TABS: 50.0000 mg | ORAL_TABLET | Freq: Two times a day (BID) | ORAL | Status: DC

## 2013-02-23 ENCOUNTER — Encounter (INDEPENDENT_AMBULATORY_CARE_PROVIDER_SITE_OTHER): Payer: 59

## 2013-02-23 ENCOUNTER — Other Ambulatory Visit: Payer: Self-pay | Admitting: *Deleted

## 2013-02-23 DIAGNOSIS — E7800 Pure hypercholesterolemia, unspecified: Secondary | ICD-10-CM

## 2013-02-23 DIAGNOSIS — Z5181 Encounter for therapeutic drug level monitoring: Secondary | ICD-10-CM

## 2013-02-23 DIAGNOSIS — R0989 Other specified symptoms and signs involving the circulatory and respiratory systems: Secondary | ICD-10-CM

## 2013-02-23 MED ORDER — SIMVASTATIN 40 MG PO TABS: 40.0000 mg | ORAL_TABLET | Freq: Every day | ORAL | Status: DC

## 2013-04-06 ENCOUNTER — Ambulatory Visit (INDEPENDENT_AMBULATORY_CARE_PROVIDER_SITE_OTHER): Payer: 59 | Admitting: Cardiovascular Disease

## 2013-04-06 ENCOUNTER — Encounter: Payer: Self-pay | Admitting: Cardiovascular Disease

## 2013-04-06 ENCOUNTER — Other Ambulatory Visit: Payer: 59

## 2013-04-06 VITALS — BP 110/66 | HR 69 | Ht 69.0 in | Wt 278.8 lb

## 2013-04-06 DIAGNOSIS — I10 Essential (primary) hypertension: Secondary | ICD-10-CM

## 2013-04-06 DIAGNOSIS — I2510 Atherosclerotic heart disease of native coronary artery without angina pectoris: Secondary | ICD-10-CM

## 2013-04-06 DIAGNOSIS — E785 Hyperlipidemia, unspecified: Secondary | ICD-10-CM

## 2013-04-06 MED ORDER — NITROGLYCERIN 0.4 MG SL SUBL: 0.4000 mg | SUBLINGUAL_TABLET | SUBLINGUAL | Status: DC | PRN

## 2013-04-06 NOTE — Progress Notes (Signed)
History of Present Illness: 62 yo male with history of CAD, HTN, HLD who is here today for cardiac follow up. He has been followed in the past by Dr. Riley Kill. He had an anterior MI in 2007 with stent placed in LAD at that time. Last cath 06/23/10 per Dr. Riley Kill with 30-40% proximal LAD restenosis, 30% mid LAD, 40% mid Circumflex, no RCA disease, LVEF=45-50%.   He is here today for follow up. He has been doing well. He has occasional sharp chest pains. No exertional chest pains. 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   Primary Care Physician:  Eric Form  Last Lipid Profile: Followed in primary care  Past Medical History  Diagnosis Date  . Coronary artery disease   . Hypertension   . Hyperlipidemia   . Hemorrhoids   . Diverticulosis     mild  . Hx of colonic polyps   . Left ventricular dysfunction     hx of    Past Surgical History  Procedure Laterality Date  . Angioplasty      and bare metal stent placement    Current Outpatient Prescriptions  Medication Sig Dispense Refill  . aspirin 81 MG tablet Take 81 mg by mouth daily.        . clonazePAM (KLONOPIN) 1 MG tablet Take 1 mg by mouth at bedtime as needed.        . furosemide (LASIX) 40 MG tablet Take 40 mg by mouth 2 (two) times daily.        Marland Kitchen loratadine (CLARITIN) 10 MG tablet Take 10 mg by mouth daily.      . meloxicam (MOBIC) 7.5 MG tablet Take 1 tablet (7.5 mg total) by mouth 2 (two) times daily as needed.  60 tablet  0  . metFORMIN (GLUCOPHAGE) 500 MG tablet Take 500 mg by mouth daily with breakfast.        . metoprolol (LOPRESSOR) 50 MG tablet Take 1 tablet (50 mg total) by mouth 2 (two) times daily.  60 tablet  11  . nitroGLYCERIN (NITROSTAT) 0.4 MG SL tablet Place 0.4 mg under the tongue every 5 (five) minutes as needed.        . ramipril (ALTACE) 2.5 MG tablet Take 2.5 mg by mouth daily.        . simvastatin (ZOCOR) 40 MG tablet Take 1 tablet (40 mg total) by mouth at bedtime.  90 tablet  3   No  current facility-administered medications for this visit.    Allergies  Allergen Reactions  . Penicillins     History   Social History  . Marital Status: Married    Spouse Name: N/A    Number of Children: N/A  . Years of Education: N/A   Occupational History  . 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 Smoking status: Never Smoker   . Smokeless tobacco: Not on file  . Alcohol Use: No  . Drug Use: No  . Sexually Active: Not on file   Other Topics Concern  . Not on file   Social History Narrative  . No narrative on file    Family History  Problem Relation Age of Onset  . Coronary artery disease      positive family hx of    Review of Systems:  As stated in the HPI and otherwise negative.   BP 110/66  Pulse 69  Ht 5\' 9"  (1.753 m)  Wt 278 lb 12.8 oz (126.463  kg)  BMI 41.15 kg/m2  SpO2 93%  Physical Examination: General: Well developed, well nourished, NAD HEENT: OP clear, mucus membranes moist SKIN: warm, dry. No rashes. Neuro: No focal deficits Musculoskeletal: Muscle strength 5/5 all ext Psychiatric: Mood and affect normal Neck: No JVD, no carotid bruits, no thyromegaly, no lymphadenopathy. Lungs:Clear bilaterally, no wheezes, rhonci, crackles Cardiovascular: Regular rate and rhythm. No murmurs, gallops or rubs. Abdomen:Soft. Bowel sounds present. Non-tender.  Extremities: No lower extremity edema. Pulses are 2 + in the bilateral DP/PT.  EKG: NSR, rate 65 bpm. Non-specific T wave abnormality.   Assessment and Plan:   1. CAD: Stable. Continue ASA, statin, Ace-inh, beta blocker.   2. HTN: BP controlled.   3. HLD: He is on a statin. Lipids followed in primary care. Will try to get labs from Dr. Clelia Croft

## 2013-04-06 NOTE — Patient Instructions (Addendum)
Your physician wants you to follow-up in:  6 months. You will receive a reminder letter in the mail two months in advance. If you don't receive a letter, please call our office to schedule the follow-up appointment.   

## 2013-04-07 ENCOUNTER — Telehealth: Payer: Self-pay | Admitting: Cardiovascular Disease

## 2013-04-07 NOTE — Telephone Encounter (Signed)
ROI faxed to Dr.Shaw/[facility] at [phone] 04/07/13/KM

## 2013-04-23 ENCOUNTER — Telehealth: Payer: Self-pay | Admitting: *Deleted

## 2013-04-23 ENCOUNTER — Telehealth: Payer: Self-pay | Admitting: Cardiovascular Disease

## 2013-04-23 NOTE — Telephone Encounter (Signed)
Received fax from medical records at [facility] of last office visit with Dr. Clelia Croft on Feb 23, 2013. Pt has not had recent lipid and liver profiles done in their office. Plan is to check at next visit in September.

## 2013-04-23 NOTE — Telephone Encounter (Signed)
Pt was here recently for office visit with Dr. Clifton James and signed release of information to get records from Dr. Alver Fisher office [facility]). We received message that [facility] did not have pt in their system.  I called pt to get name of correct primary care MD. Left message to call back.

## 2013-04-23 NOTE — Telephone Encounter (Signed)
Fax Cover Sheet Received back From [facility] stating No  OV Notes or Labs on this Pt. ROi sent to [facility] 7/1 &7/17 requesting these  Records. Staff Message pat letting Her Know. 04/23/13/KM

## 2013-04-23 NOTE — Telephone Encounter (Signed)
Spoke with pt who confirms his MD is Dr. Clelia Croft at [facility]. I left message for medical records at [facility] to call back

## 2013-04-24 NOTE — Telephone Encounter (Signed)
Left message for medical records at [facility] to please fax labs when checked in September.

## 2013-07-07 ENCOUNTER — Encounter: Payer: Self-pay | Admitting: Cardiovascular Disease

## 2013-07-13 ENCOUNTER — Encounter: Payer: Self-pay | Admitting: Cardiovascular Disease

## 2013-10-14 ENCOUNTER — Encounter (HOSPITAL_COMMUNITY): Payer: Self-pay | Admitting: Emergency Medicine

## 2013-10-14 ENCOUNTER — Emergency Department (HOSPITAL_COMMUNITY)
Admission: EM | Admit: 2013-10-14 | Discharge: 2013-10-14 | Disposition: A | Discharge status: Home / Self Care | Payer: 59 | Source: Home / Self Care | Attending: Emergency Medicine | Admitting: Emergency Medicine

## 2013-10-14 DIAGNOSIS — K047 Periapical abscess without sinus: Secondary | ICD-10-CM

## 2013-10-14 MED ORDER — OXYCODONE-ACETAMINOPHEN 5-325 MG PO TABS: ORAL_TABLET | ORAL | Status: DC

## 2013-10-14 MED ORDER — CLINDAMYCIN HCL 300 MG PO CAPS: 300.0000 mg | ORAL_CAPSULE | Freq: Four times a day (QID) | ORAL | Status: DC

## 2013-10-14 NOTE — ED Provider Notes (Signed)
Chief Complaint:   Chief Complaint  Patient presents with  . Oral Swelling    History of Present Illness:   Vincent [name] is a 63 year old male who has had a two-day history of a toothache of one of his right lower teeth. He has swelling of the gingiva, the lip, the jaw, and the neck. It hurts to chew on that side. He denies any difficulty swallowing or breathing. He's had no headache or fever.  Review of Systems:  Other than noted above, the patient denies any of the following symptoms: Systemic:  No fever, chills,  Or sweats. ENT:  No headache, ear ache, sore throat, nasal congestion, facial pain, or swelling. Lymphatic:  No adenopathy. Lungs:  No coughing, wheezing or shortness of breath.  [place]:  Past medical history, family history, social history, meds, and allergies were reviewed. He had a myocardial infarction in 2007 and has had a stent in place. He has type 2 diabetes. He's allergic to penicillins. Current meds include aspirin, Klonopin, Lasix, Mobic, Glucophage, Lopressor, Nitrostat, ramipril, and Zocor.  Physical Exam:   Vital signs:  BP 140/70  Pulse 95  Temp(Src) 98.1 F (36.7 C) (Oral)  Resp 16  SpO2 97% General:  Alert, oriented, in no distress. ENT:  TMs and canals normal.  Nasal mucosa normal. Mouth exam:  He only has a few teeth left in his mouth. The tooth in question appears to be his right, lower, canine tooth. It's markedly decayed and only a small stump his left. There is surrounding swelling of the gingiva and of the lower lip. There is no swelling of the tongue or floor the mouth. The pharynx is clear and widely patent. He has some pain in mouth opening but he is able to open his mouth normally. Neck:  No swelling or adenopathy. Lungs:  Breath sounds clear and equal bilaterally.  No wheezes, rales or rhonchi. Heart:  Regular rhythm.  No gallops or murmers. Skin:  Clear, warm and dry.   Assessment:  The encounter diagnosis was Dental abscess.  Plan:   1.   Meds:  The following meds were prescribed:   Discharge Medication List as of 10/14/2013  6:08 PM    START taking these medications   Details  clindamycin (CLEOCIN) 300 MG capsule Take 1 capsule (300 mg total) by mouth 4 (four) times daily., Starting 10/14/2013, Until Discontinued, Normal    oxyCODONE-acetaminophen (PERCOCET) 5-325 MG per tablet 1 to 2 tablets every 6 hours as needed for pain., Print        2.  Patient Education/Counseling:  The patient was given appropriate handouts, self care instructions, and instructed in symptomatic relief. Suggested sleeping with head of bed elevated and hot salt water mouthwash.  3.  Follow up:  The patient was told to follow up if no better in 3 to 4 days, if becoming worse in any way, and given some red flag symptoms such as difficulty swallowing or breathing which would prompt immediate return.  Follow up with a dentist as soon as posssible.     Harden Mo, MD 10/14/13 2204

## 2013-10-14 NOTE — Discharge Instructions (Signed)
Look up the [place] [facility] for free dental clinics. undoomedical.com.asp  Get there early and be prepared to wait. [facility] and [employer] have [employer]writer, advertising schools that provide low cost routine dental care.   Other resources: [facility] [place], [place] [phone]  Patients with Medicaid: [place] [address] [facility] Phone:  [phone]                                                  Phone:  [phone]  If unable to pay or uninsured, contact:  [facility] or [facility] to become qualified for the adult dental clinic.  No matter what dental problem you have, it will not get better unless you get good dental care.  If the tooth is not taken care of, your symptoms will come back in time and you will be visiting [place] again in the Urgent [place] with a bad toothache.  So, see your dentist as soon as possible.  If you don't have a dentist, we can give you a list of dentists.  Sometimes the most cost effective treatment is removal of the tooth.  This can be done very inexpensively through one of the low cost Dance movement psychotherapist such as the facility on [facility] in [place] [phone]).  The downside to this is that you will have one less tooth and this can effect your ability to chew.  Some other things that can be done for a dental infection include the following:   Rinse your mouth out with hot salt water (1/2 tsp of table salt and a pinch of baking soda in 8 oz of hot water).  You can do this every 2 or 3 hours.  Avoid cold foods, beverages, and cold air.  This will make your symptoms worse.  Sleep with your head elevated.  Sleeping flat will cause your gums and oral tissues to swell and make them hurt more.  You can sleep on several pillows.  Even  better is to sleep in a recliner with your head higher than your heart.  For mild to moderate pain, you can take Tylenol, ibuprofen, or Aleve.  External application of heat by a heating pad, hot water bottle, or hot wet towel can help with pain and speed healing.  You can do this every 2 to 3 hours. Do not fall asleep on a heating pad since this can cause a burn.

## 2013-10-14 NOTE — ED Notes (Signed)
Pt c/o pulsating/throbbing pain around gum on right side onset 5 days Reports he only has 2 teeth and most have been pulled States he was eating salsa chips and believes he may have inj his gum sxs also include: swelling  Alert w/no signs of acute distress.

## 2013-10-16 NOTE — ED Notes (Signed)
Pt. Came to [facility] and requested a work note extension. He told Caren Griffins in Registration that he is off the weekend.  Discussed with Dr. Jake Michaelis and approved another day. Note done as directed and given to pt.

## 2013-11-13 ENCOUNTER — Emergency Department (HOSPITAL_COMMUNITY)
Admission: EM | Admit: 2013-11-13 | Discharge: 2013-11-13 | Disposition: A | Discharge status: Home / Self Care | Payer: 59 | Source: Home / Self Care | Attending: Family Medicine | Admitting: Family Medicine

## 2013-11-13 ENCOUNTER — Emergency Department (INDEPENDENT_AMBULATORY_CARE_PROVIDER_SITE_OTHER): Payer: 59

## 2013-11-13 ENCOUNTER — Encounter (HOSPITAL_COMMUNITY): Payer: Self-pay | Admitting: Emergency Medicine

## 2013-11-13 DIAGNOSIS — J019 Acute sinusitis, unspecified: Secondary | ICD-10-CM

## 2013-11-13 IMAGING — CR DG CHEST 2V
2 series · 2 of 2 positions shown · non-contrast
Comparison: DG CHEST 2 VIEW dated [DATE]

CLINICAL DATA: Cough, body aches, hypertension

EXAM:
CHEST  2 VIEW

[view not recorded (1 of 2)]
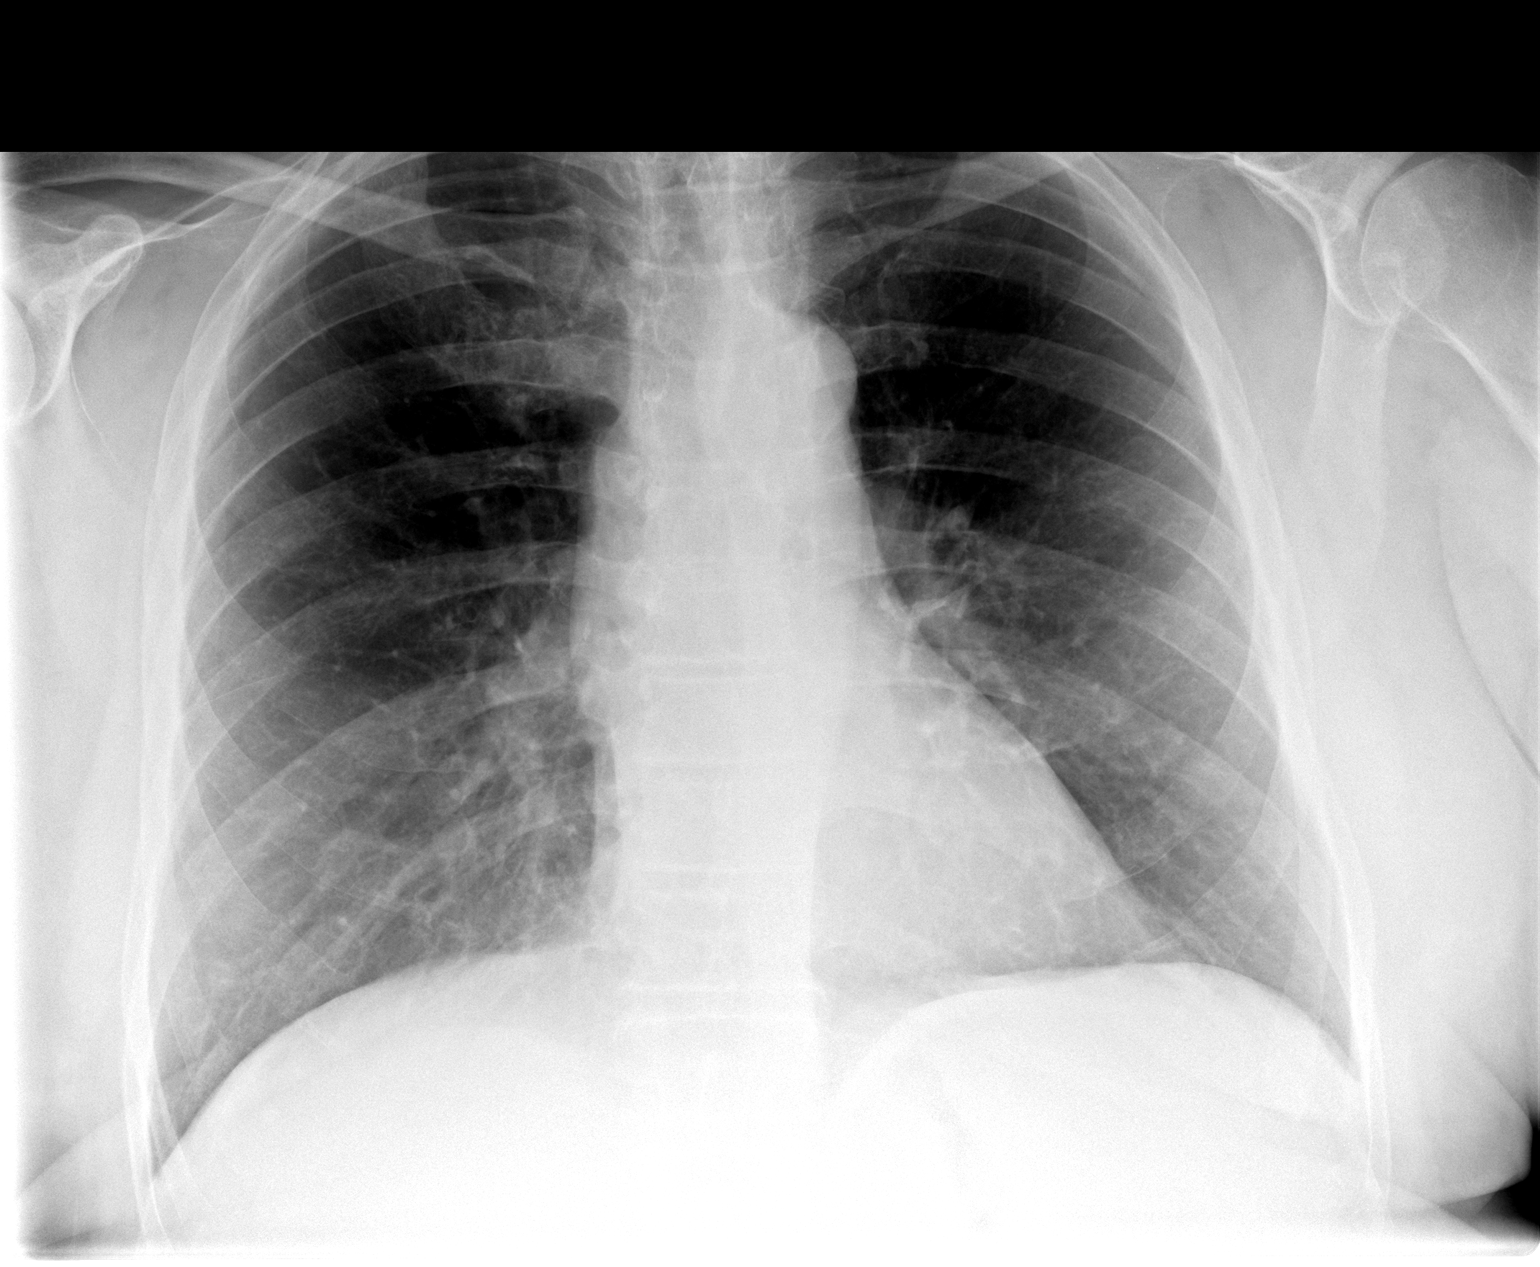

[view not recorded (2 of 2)]
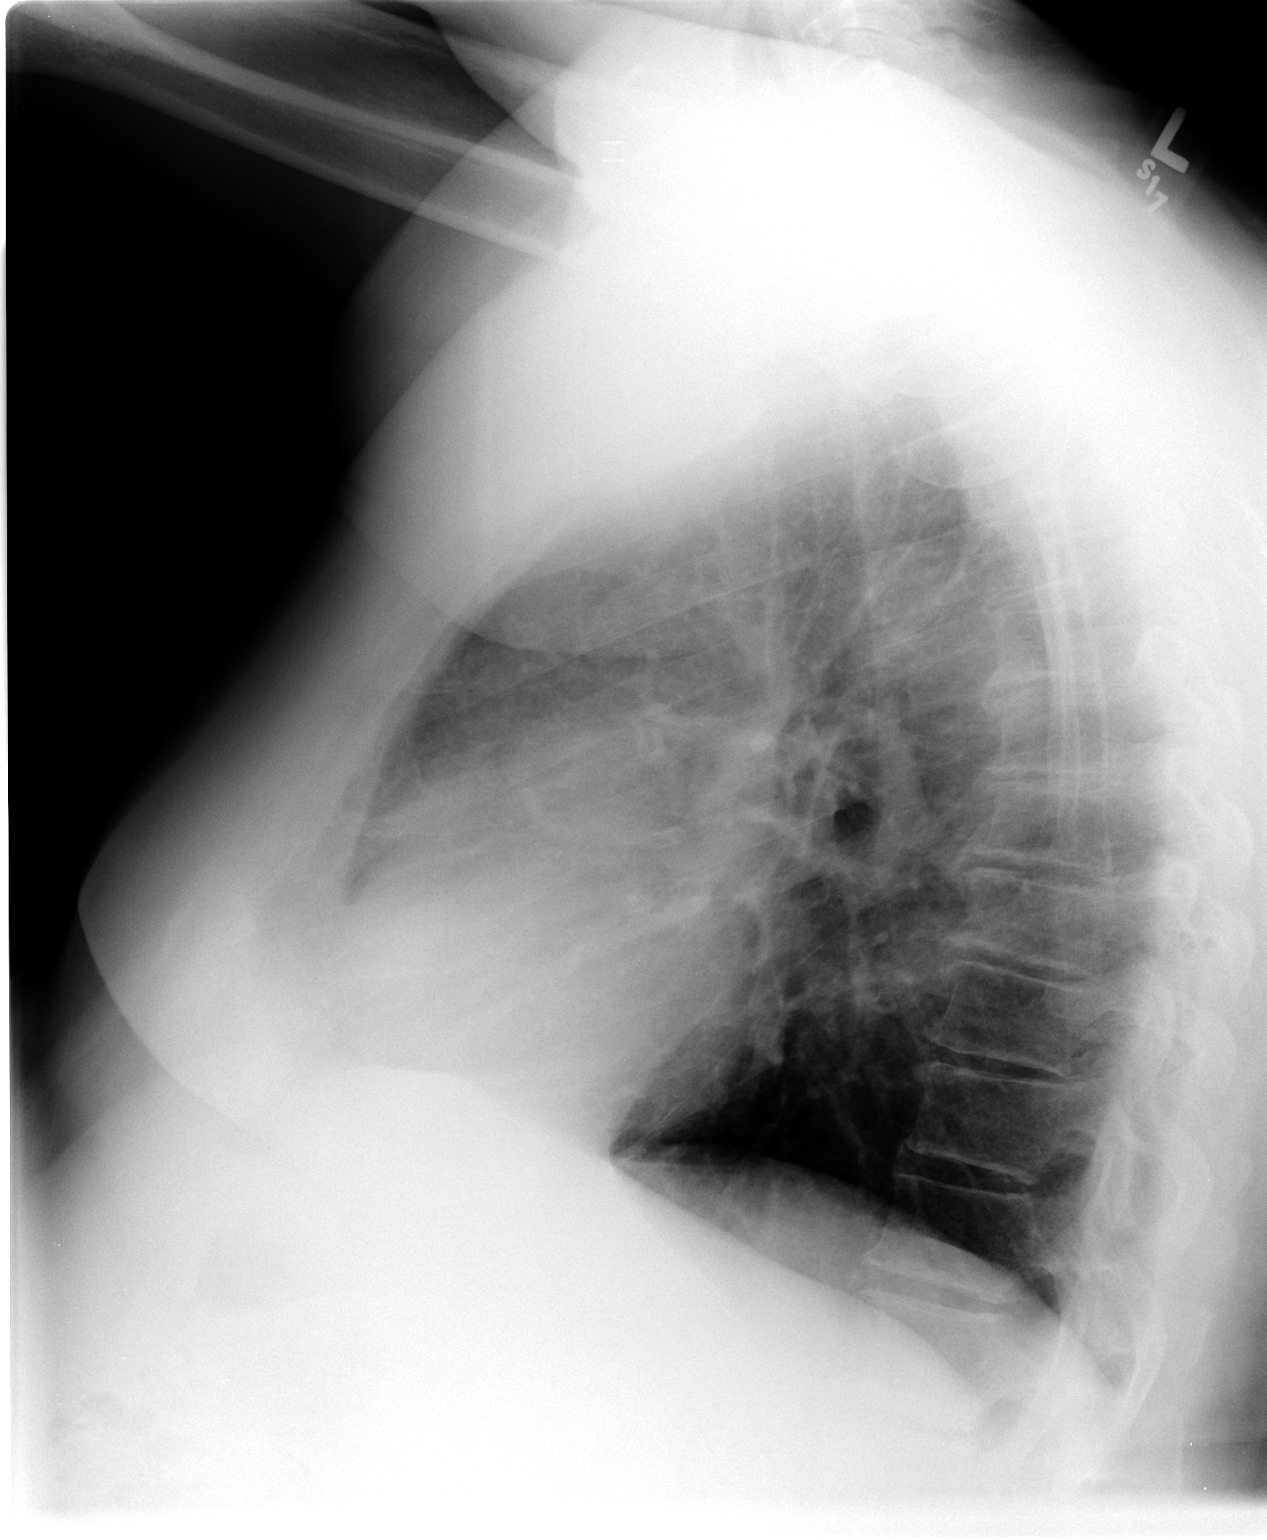

[2 of 2 positions shown; findings below may reference images not displayed]

FINDINGS: The heart size and mediastinal contours are within normal limits.
Both lungs are clear. The visualized skeletal structures are
unremarkable.
IMPRESSION: No active cardiopulmonary disease.

## 2013-11-13 MED ORDER — DOXYCYCLINE HYCLATE 100 MG PO CAPS: 100.0000 mg | ORAL_CAPSULE | Freq: Two times a day (BID) | ORAL | Status: DC

## 2013-11-13 MED ORDER — HYDROCOD POLST-CHLORPHEN POLST 10-8 MG/5ML PO LQCR: 5.0000 mL | Freq: Two times a day (BID) | ORAL | Status: DC | PRN

## 2013-11-13 NOTE — Discharge Instructions (Signed)
Rest, drink plenty of fluids and take the antibiotic as directed. Be sure to finish the antibiotic. Take the cough medication as needed as directed. Do not mix with Nyquil. Arrange follow up with your primary doctor if not improving over the next several days.   Sinusitis Sinusitis is redness, soreness, and puffiness (inflammation) of the air pockets in the bones of your face (sinuses). The redness, soreness, and puffiness can cause air and mucus to get trapped in your sinuses. This can allow germs to grow and cause an infection.  HOME CARE   Drink enough fluids to keep your pee (urine) clear or pale yellow.  Use a humidifier in your home.  Run a hot shower to create steam in the bathroom. Sit in the bathroom with the door closed. Breathe in the steam 3 4 times a day.  Put a warm, moist washcloth on your face 3 4 times a day, or as told by your doctor.  Use salt water sprays (saline sprays) to wet the thick fluid in your nose. This can help the sinuses drain.  Only take medicine as told by your doctor. GET HELP RIGHT AWAY IF:   Your pain gets worse.  You have very bad headaches.  You are sick to your stomach (nauseous).  You throw up (vomit).  You are very sleepy (drowsy) all the time.  Your face is puffy (swollen).  Your vision changes.  You have a stiff neck.  You have trouble breathing. MAKE SURE YOU:   Understand these instructions.  Will watch your condition.  Will get help right away if you are not doing well or get worse. Document Released: 03/12/2008 Document Revised: 06/18/2012 Document Reviewed: 04/29/2012 [facility] Patient Information 2014 [place].

## 2013-11-13 NOTE — ED Provider Notes (Signed)
CSN: [id number]     Arrival date & time 11/13/13  1212 History   First MD Initiated Contact with Patient 11/13/13 1422     Chief Complaint  Patient presents with  . Influenza    Patient is a 63 y.o. male presenting with URI. The history is provided by the patient.  URI Presenting symptoms: congestion, cough, fatigue, rhinorrhea and sore throat   Presenting symptoms: no ear pain, no facial pain and no fever   Severity:  Moderate Onset quality:  Gradual Duration:  3 days Timing:  Constant Progression:  Worsening Chronicity:  New Relieved by:  Nothing Worsened by:  Belching Ineffective treatments:  OTC medications Associated symptoms: headaches, sinus pain and sneezing   Associated symptoms: no wheezing   Risk factors: being elderly   Pt reports a 3 to 4 day h/o worsening sinus congestion, runny nose,  scratchy throat, cough productive of thick greenish yellow secretions and frontal headaches. States symptoms unrelieved by Nyquil. States she felt so bad last night he had to come home from work. Pt reports a h/o "walking pneumonia" and is concerned he may have again. Denies CP, SOB, N/V/D or other related symptoms. Pt is not a smoker.   Past Medical History  Diagnosis Date  . Coronary artery disease   . Hypertension   . Hyperlipidemia   . Hemorrhoids   . Diverticulosis     mild  . Hx of colonic polyps   . Left ventricular dysfunction     hx of   Past Surgical History  Procedure Laterality Date  . Angioplasty      and bare metal stent placement   Family History  Problem Relation Age of Onset  . Coronary artery disease      positive family hx of   History  Substance Use Topics  . Smoking status: Never Smoker   . Smokeless tobacco: Not on file  . Alcohol Use: No    Review of Systems  Constitutional: Positive for chills and fatigue. Negative for fever.  HENT: Positive for congestion, postnasal drip, rhinorrhea, sinus pressure, sneezing and sore throat. Negative for ear  pain and trouble swallowing.   Eyes: Negative.   Respiratory: Positive for cough. Negative for shortness of breath and wheezing.   Cardiovascular: Negative.   Gastrointestinal: Negative for nausea, abdominal pain and diarrhea.  Endocrine: Negative.   Genitourinary: Negative.   Musculoskeletal: Negative.   Allergic/Immunologic: Negative.   Neurological: Positive for headaches.  Hematological: Negative.   Psychiatric/Behavioral: Negative.     Allergies  Penicillins  Home Medications   Current Outpatient Rx  Name  Route  Sig  Dispense  Refill  . aspirin 81 MG tablet   Oral   Take 81 mg by mouth daily.           . clonazePAM (KLONOPIN) 1 MG tablet   Oral   Take 1 mg by mouth at bedtime as needed.           . furosemide (LASIX) 40 MG tablet   Oral   Take 40 mg by mouth 2 (two) times daily.           . metFORMIN (GLUCOPHAGE) 500 MG tablet   Oral   Take 500 mg by mouth daily with breakfast.           . metoprolol (LOPRESSOR) 50 MG tablet   Oral   Take 1 tablet (50 mg total) by mouth 2 (two) times daily.   60 tablet   11   .  ramipril (ALTACE) 2.5 MG tablet   Oral   Take 2.5 mg by mouth daily.           . simvastatin (ZOCOR) 40 MG tablet   Oral   Take 1 tablet (40 mg total) by mouth at bedtime.   90 tablet   3   . clindamycin (CLEOCIN) 300 MG capsule   Oral   Take 1 capsule (300 mg total) by mouth 4 (four) times daily.   40 capsule   0   . loratadine (CLARITIN) 10 MG tablet   Oral   Take 10 mg by mouth daily.         . meloxicam (MOBIC) 7.5 MG tablet   Oral   Take 1 tablet (7.5 mg total) by mouth 2 (two) times daily as needed.   60 tablet   0   . nitroGLYCERIN (NITROSTAT) 0.4 MG SL tablet   Sublingual   Place 1 tablet (0.4 mg total) under the tongue every 5 (five) minutes as needed.   25 tablet   6   . oxyCODONE-acetaminophen (PERCOCET) 5-325 MG per tablet      1 to 2 tablets every 6 hours as needed for pain.   20 tablet   0    BP  151/83  Pulse 86  Temp(Src) 98.7 F (37.1 C) (Oral)  Resp 20  SpO2 96% Physical Exam  Constitutional: He is oriented to person, place, and time. He appears well-developed and well-nourished. No distress.  HENT:  Head: Normocephalic and atraumatic.  Right Ear: Tympanic membrane, external ear and ear canal normal.  Left Ear: Tympanic membrane, external ear and ear canal normal.  Nose: Mucosal edema, rhinorrhea and sinus tenderness present. Right sinus exhibits maxillary sinus tenderness and frontal sinus tenderness. Left sinus exhibits maxillary sinus tenderness and frontal sinus tenderness.  Mouth/Throat: Uvula is midline and mucous membranes are normal. No posterior oropharyngeal edema.  Pharyngeal cobblestoning  Eyes: Conjunctivae are normal.  Neck: Normal range of motion. Neck supple.  Cardiovascular: Normal rate and regular rhythm.   Pulmonary/Chest: Effort normal and breath sounds normal.  Abdominal: Soft. Bowel sounds are normal.  Musculoskeletal: Normal range of motion.  Neurological: He is alert and oriented to person, place, and time.  Skin: Skin is warm and dry.  Psychiatric: He has a normal mood and affect.    ED Course  Procedures (including critical care time) Labs Review Labs Reviewed - No data to display Imaging Review No results found.    MDM  No diagnosis found.  3-4 day h/o symptoms c/w acute sinusitis and productive cough. No fever. H/o "walking Pneumonia" and was concerned for same. CXR unremarkable. Will treat for acute sinusitis (Doxycycline 100mg  BID x 10 days) as pt PEN allergic. States reaction is "hives" but not sure. Will also prescribe a short course of tussiness for cough and recommend PCP f/u if not improving. Pt agreeable w/ plan.    Jeryl Columbia, NP 11/13/13 [phone]

## 2013-11-13 NOTE — ED Notes (Signed)
C/o cough, body aches x 4 days

## 2013-11-18 NOTE — ED Provider Notes (Signed)
Medical screening examination/treatment/procedure(s) were performed by resident physician or non-physician practitioner and as supervising physician I was immediately available for consultation/collaboration.   Pauline Good MD.   Billy Fischer, MD 11/18/13 [phone]

## 2013-11-30 ENCOUNTER — Other Ambulatory Visit: Payer: Self-pay | Admitting: Cardiovascular Disease

## 2013-12-01 ENCOUNTER — Telehealth: Payer: Self-pay | Admitting: Nurse Practitioner

## 2013-12-01 MED ORDER — METOPROLOL TARTRATE 50 MG PO TABS: 50.0000 mg | ORAL_TABLET | Freq: Two times a day (BID) | ORAL | Status: DC

## 2013-12-01 NOTE — Telephone Encounter (Signed)
Phone call - upset that his Metoprolol has not been refilled - I have called this in for him - Metoprolol 50 mg BID #60 with 6 refills.  Patient is agreeable to this plan and will call if any problems develop in the interim.   Burtis Junes, RN, [facility] [address] [place], [place]  [address] [phone]

## 2014-01-06 ENCOUNTER — Emergency Department (INDEPENDENT_AMBULATORY_CARE_PROVIDER_SITE_OTHER): Payer: 59

## 2014-01-06 ENCOUNTER — Emergency Department (INDEPENDENT_AMBULATORY_CARE_PROVIDER_SITE_OTHER)
Admission: EM | Admit: 2014-01-06 | Discharge: 2014-01-06 | Disposition: A | Discharge status: Home / Self Care | Payer: 59 | Source: Home / Self Care | Attending: Family Medicine | Admitting: Family Medicine

## 2014-01-06 ENCOUNTER — Encounter (HOSPITAL_COMMUNITY): Payer: Self-pay | Admitting: Emergency Medicine

## 2014-01-06 DIAGNOSIS — K859 Acute pancreatitis without necrosis or infection, unspecified: Secondary | ICD-10-CM

## 2014-01-06 LAB — CBC
HCT: 45.2 % (ref 39.0–52.0)
Hemoglobin: 15.4 g/dL (ref 13.0–17.0)
MCH: 31.4 pg (ref 26.0–34.0)
MCHC: 34.1 g/dL (ref 30.0–36.0)
MCV: 92.2 fL (ref 78.0–100.0)
Platelets: 230 10*3/uL (ref 150–400)
RBC: 4.9 MIL/uL (ref 4.22–5.81)
RDW: 13.6 % (ref 11.5–15.5)
WBC: 8.9 10*3/uL (ref 4.0–10.5)

## 2014-01-06 LAB — POCT URINALYSIS DIP (DEVICE)
Bilirubin Urine: NEGATIVE
Glucose, UA: NEGATIVE mg/dL
Hgb urine dipstick: NEGATIVE
Ketones, ur: NEGATIVE mg/dL
Leukocytes, UA: NEGATIVE
Nitrite: NEGATIVE
Protein, ur: NEGATIVE mg/dL
Specific Gravity, Urine: 1.015 (ref 1.005–1.030)
Urobilinogen, UA: 0.2 mg/dL (ref 0.0–1.0)
pH: 6 (ref 5.0–8.0)

## 2014-01-06 LAB — COMPREHENSIVE METABOLIC PANEL
ALT: 17 U/L (ref 0–53)
AST: 21 U/L (ref 0–37)
Albumin: 4.1 g/dL (ref 3.5–5.2)
Alkaline Phosphatase: 85 U/L (ref 39–117)
BUN: 13 mg/dL (ref 6–23)
CO2: 27 mEq/L (ref 19–32)
Calcium: 9.1 mg/dL (ref 8.4–10.5)
Chloride: 101 mEq/L (ref 96–112)
Creatinine, Ser: 1.04 mg/dL (ref 0.50–1.35)
GFR calc Af Amer: 87 mL/min — ABNORMAL LOW (ref >90)
GFR calc non Af Amer: 75 mL/min — ABNORMAL LOW (ref >90)
Glucose, Bld: 139 mg/dL — ABNORMAL HIGH (ref 70–99)
Potassium: 4.4 mEq/L (ref 3.7–5.3)
Sodium: 141 mEq/L (ref 137–147)
Total Bilirubin: 0.3 mg/dL (ref 0.3–1.2)
Total Protein: 7.4 g/dL (ref 6.0–8.3)

## 2014-01-06 LAB — LIPASE, BLOOD: Lipase: 65 U/L — ABNORMAL HIGH (ref 11–59)

## 2014-01-06 IMAGING — CR DG ABDOMEN ACUTE W/ 1V CHEST
5 series · 5 of 5 positions shown · non-contrast
Comparison: Chest radiograph [DATE]

CLINICAL DATA: Abdominal pain

EXAM:
ACUTE ABDOMEN SERIES (ABDOMEN 2 VIEW & CHEST 1 VIEW)

[view not recorded (1 of 5)]
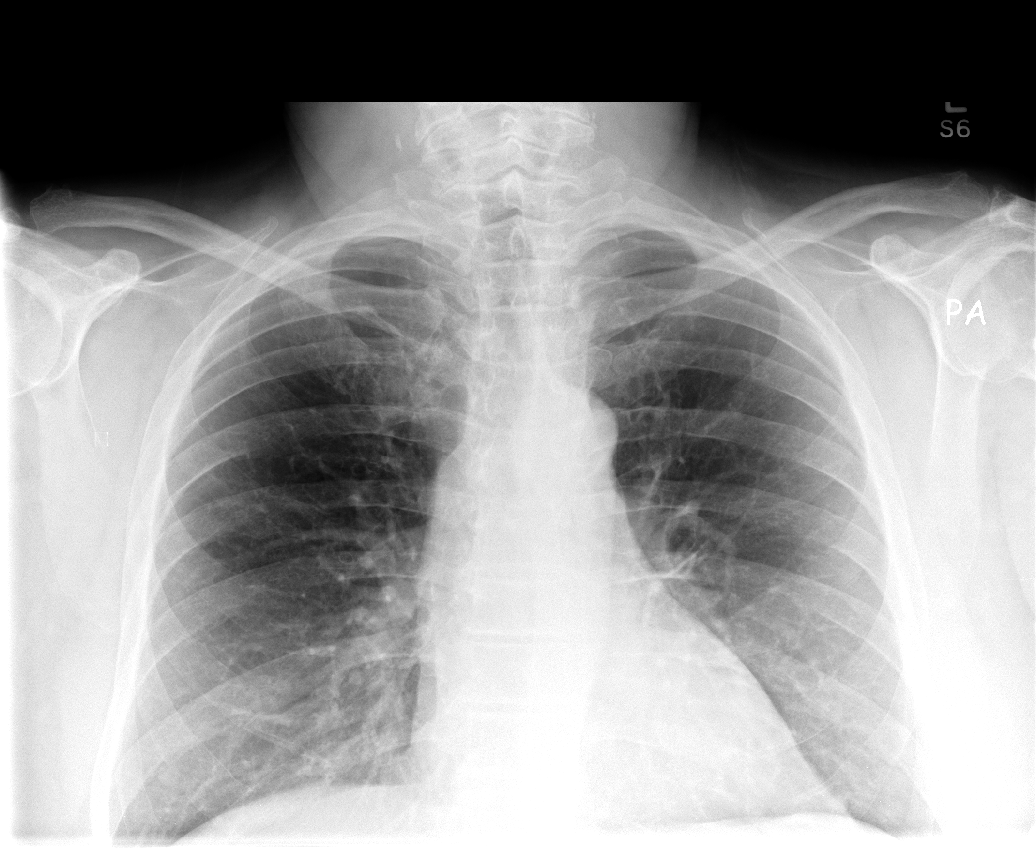

[view not recorded (2 of 5)]
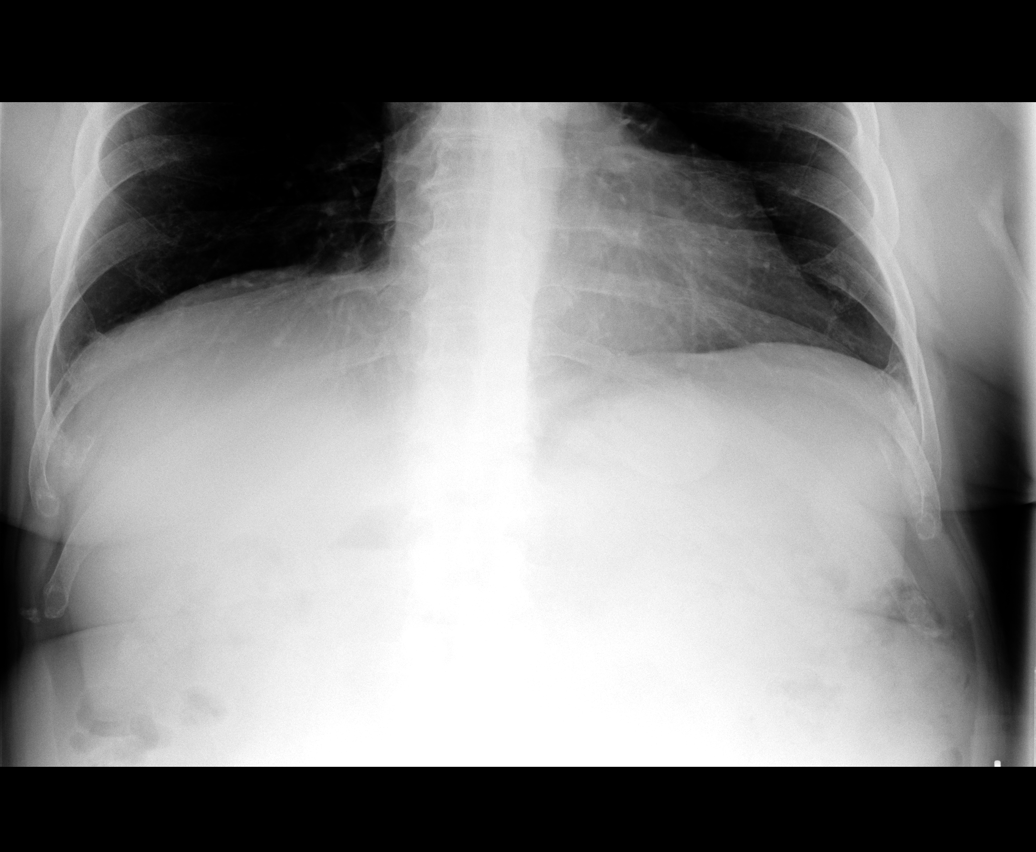

[view not recorded (3 of 5)]
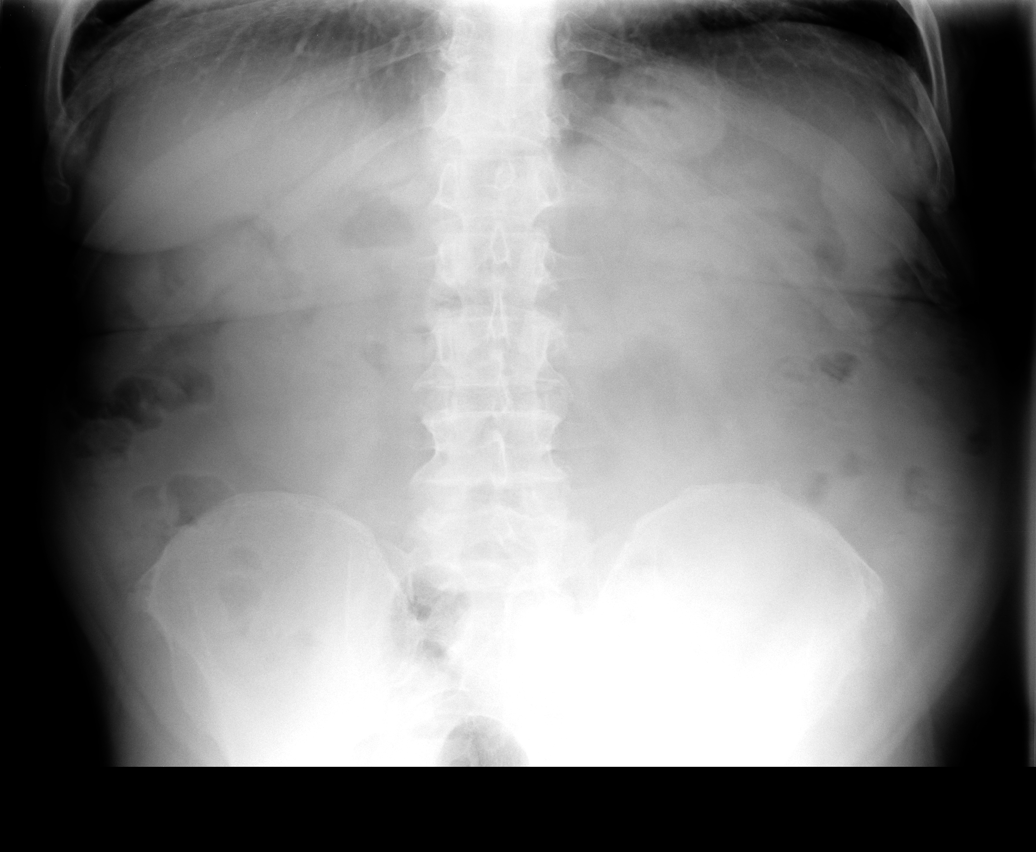

[view not recorded (4 of 5)]
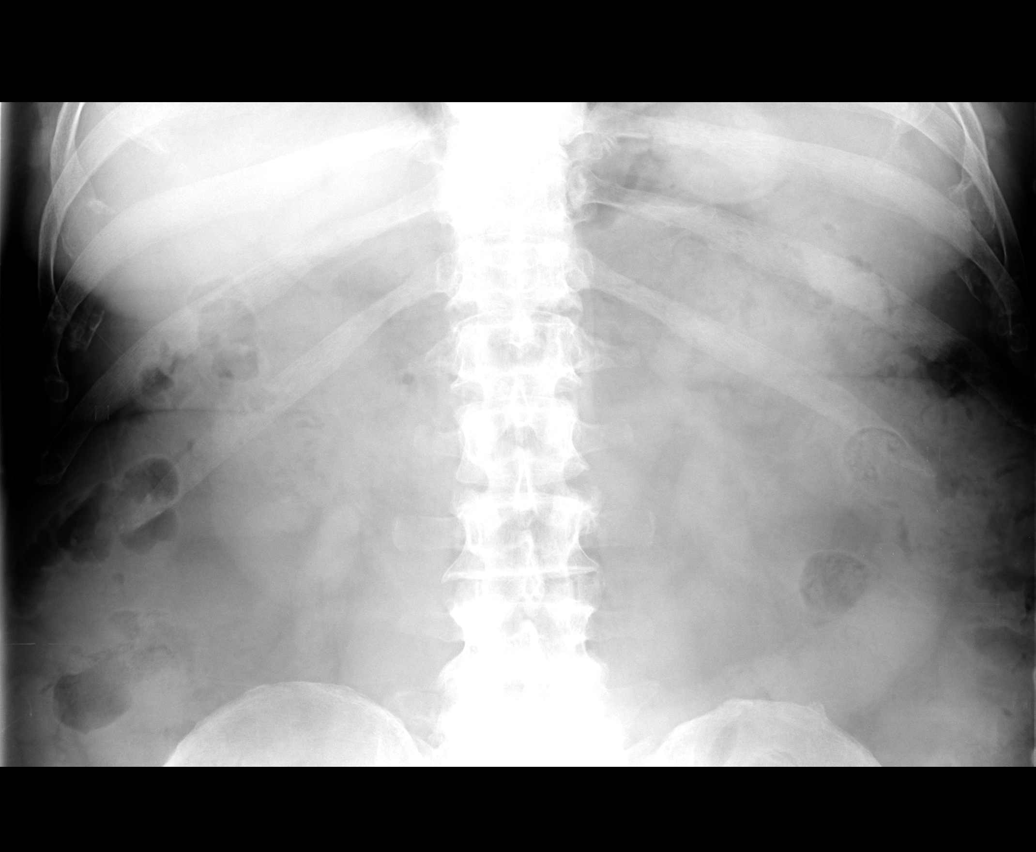

[view not recorded (5 of 5)]
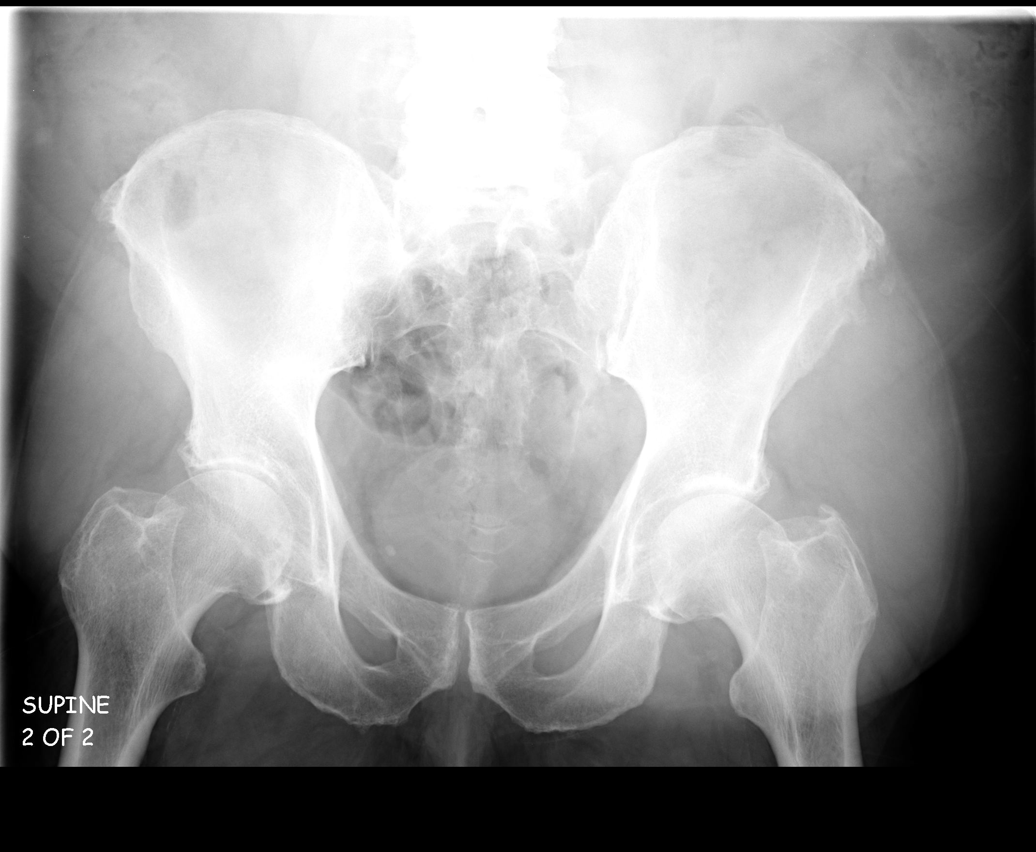

[5 of 5 positions shown; findings below may reference images not displayed]

FINDINGS: PA chest: There is no edema or consolidation. There is a degree of
underlying emphysematous change. Heart size is normal. Pulmonary
vascularity reflects underlying emphysema. No adenopathy. There are
scattered foci of calcification in both carotid arteries.

Supine and upright abdomen: There is moderate stool throughout the
colon. The overall bowel gas pattern is unremarkable. No obstruction
or free air. There is a small phlebolith in lower right pelvis.
There is degenerative change in the lumbar spine.
IMPRESSION: Bowel gas pattern unremarkable. Underlying emphysematous change. No
edema or consolidation.

## 2014-01-06 MED ORDER — PANTOPRAZOLE SODIUM 40 MG PO TBEC: 40.0000 mg | DELAYED_RELEASE_TABLET | Freq: Every day | ORAL | Status: DC

## 2014-01-06 NOTE — ED Notes (Signed)
C/o abd pain which started yesterday States he is bloated, has nausea and diarrhea Denies vomiting

## 2014-01-06 NOTE — ED Provider Notes (Signed)
Vincent [name] is a 63 y.o. male who presents to Urgent Care today for abdominal pain. Patient has one day of constipation abdominal pain and nausea. He additionally notes a bloating sensation. He denies any black tarry stools or blood. He has not tried any medications yet. No chest pains palpitations shortness of breath. He states that he does not drink alcohol.   Past Medical History  Diagnosis Date  . Coronary artery disease   . Hypertension   . Hyperlipidemia   . Hemorrhoids   . Diverticulosis     mild  . Hx of colonic polyps   . Left ventricular dysfunction     hx of   History  Substance Use Topics  . Smoking status: Never Smoker   . Smokeless tobacco: Not on file  . Alcohol Use: No   ROS as above Medications: No current facility-administered medications for this encounter.   Current Outpatient Prescriptions  Medication Sig Dispense Refill  . aspirin 81 MG tablet Take 81 mg by mouth daily.        . chlorpheniramine-HYDROcodone (TUSSIONEX PENNKINETIC ER) 10-8 MG/5ML LQCR Take 5 mLs by mouth every 12 (twelve) hours as needed for cough.  50 mL  0  . clindamycin (CLEOCIN) 300 MG capsule Take 1 capsule (300 mg total) by mouth 4 (four) times daily.  40 capsule  0  . clonazePAM (KLONOPIN) 1 MG tablet Take 1 mg by mouth at bedtime as needed.        . doxycycline (VIBRAMYCIN) 100 MG capsule Take 1 capsule (100 mg total) by mouth 2 (two) times daily.  20 capsule  0  . furosemide (LASIX) 40 MG tablet Take 40 mg by mouth 2 (two) times daily.        Marland Kitchen loratadine (CLARITIN) 10 MG tablet Take 10 mg by mouth daily.      . meloxicam (MOBIC) 7.5 MG tablet Take 1 tablet (7.5 mg total) by mouth 2 (two) times daily as needed.  60 tablet  0  . metFORMIN (GLUCOPHAGE) 500 MG tablet Take 500 mg by mouth daily with breakfast.        . metoprolol (LOPRESSOR) 50 MG tablet Take 1 tablet (50 mg total) by mouth 2 (two) times daily.  60 tablet  6  . nitroGLYCERIN (NITROSTAT) 0.4 MG SL tablet Place 1 tablet  (0.4 mg total) under the tongue every 5 (five) minutes as needed.  25 tablet  6  . oxyCODONE-acetaminophen (PERCOCET) 5-325 MG per tablet 1 to 2 tablets every 6 hours as needed for pain.  20 tablet  0  . pantoprazole (PROTONIX) 40 MG tablet Take 1 tablet (40 mg total) by mouth daily.  30 tablet  1  . ramipril (ALTACE) 2.5 MG tablet Take 2.5 mg by mouth daily.        . simvastatin (ZOCOR) 40 MG tablet Take 1 tablet (40 mg total) by mouth at bedtime.  90 tablet  3    Exam:  BP 112/84  Pulse 75  Temp(Src) 99 F (37.2 C) (Oral)  Resp 16  SpO2 100% Gen: Well NAD obese HEENT: EOMI,  MMM Lungs: Normal work of breathing. CTABL Heart: RRR no MRG Abd: NABS, Soft. NT, ND obese stomach with diastases recti at the midline. No rebound or guarding Exts: Brisk capillary refill, warm and well perfused.   Results for orders placed during the hospital encounter of 01/06/14 (from the past 24 hour(s))  COMPREHENSIVE METABOLIC PANEL     Status: Abnormal   Collection  Time    01/06/14 12:00 PM      Result Value Ref Range   Sodium 141  137 - 147 mEq/L   Potassium 4.4  3.7 - 5.3 mEq/L   Chloride 101  96 - 112 mEq/L   CO2 27  19 - 32 mEq/L   Glucose, Bld 139 (*) 70 - 99 mg/dL   BUN 13  6 - 23 mg/dL   Creatinine, Ser 1.04  0.50 - 1.35 mg/dL   Calcium 9.1  8.4 - 10.5 mg/dL   Total Protein 7.4  6.0 - 8.3 g/dL   Albumin 4.1  3.5 - 5.2 g/dL   AST 21  0 - 37 U/L   ALT 17  0 - 53 U/L   Alkaline Phosphatase 85  39 - 117 U/L   Total Bilirubin 0.3  0.3 - 1.2 mg/dL   GFR calc non Af Amer 75 (*) >90 mL/min   GFR calc Af Amer 87 (*) >90 mL/min  CBC     Status: None   Collection Time    01/06/14 12:00 PM      Result Value Ref Range   WBC 8.9  4.0 - 10.5 K/uL   RBC 4.90  4.22 - 5.81 MIL/uL   Hemoglobin 15.4  13.0 - 17.0 g/dL   HCT 45.2  39.0 - 52.0 %   MCV 92.2  78.0 - 100.0 fL   MCH 31.4  26.0 - 34.0 pg   MCHC 34.1  30.0 - 36.0 g/dL   RDW 13.6  11.5 - 15.5 %   Platelets 230  150 - 400 K/uL  LIPASE,  BLOOD     Status: Abnormal   Collection Time    01/06/14 12:00 PM      Result Value Ref Range   Lipase 65 (*) 11 - 59 U/L  POCT URINALYSIS DIP (DEVICE)     Status: None   Collection Time    01/06/14 12:57 PM      Result Value Ref Range   Glucose, UA NEGATIVE  NEGATIVE mg/dL   Bilirubin Urine NEGATIVE  NEGATIVE   Ketones, ur NEGATIVE  NEGATIVE mg/dL   Specific Gravity, Urine 1.015  1.005 - 1.030   Hgb urine dipstick NEGATIVE  NEGATIVE   pH 6.0  5.0 - 8.0   Protein, ur NEGATIVE  NEGATIVE mg/dL   Urobilinogen, UA 0.2  0.0 - 1.0 mg/dL   Nitrite NEGATIVE  NEGATIVE   Leukocytes, UA NEGATIVE  NEGATIVE   Dg Abd Acute W/chest  01/06/2014   CLINICAL DATA:  Abdominal pain  EXAM: ACUTE ABDOMEN SERIES (ABDOMEN 2 VIEW & CHEST 1 VIEW)  COMPARISON:  Chest radiograph November 13, 2013  FINDINGS: PA chest: There is no edema or consolidation. There is a degree of underlying emphysematous change. Heart size is normal. Pulmonary vascularity reflects underlying emphysema. No adenopathy. There are scattered foci of calcification in both carotid arteries.  Supine and upright abdomen: There is moderate stool throughout the colon. The overall bowel gas pattern is unremarkable. No obstruction or free air. There is a small phlebolith in lower right pelvis. There is degenerative change in the lumbar spine.  IMPRESSION: Bowel gas pattern unremarkable. Underlying emphysematous change. No edema or consolidation.   Electronically Signed   By: Lowella Grip M.D.   On: 01/06/2014 12:34    Assessment and Plan: 63 y.o. male with abdominal pain and mildly elevated pancreatic enzyme. Patient refused rectal exam therefore cannot perform Hemoccult test. Working diagnosis is pancreatitis. It seems  to be relatively mild given patient's general well status and normal physical exam. Plan to treat with low fat diet, and proton pump inhibitors. Followup with primary care doctor sooner rather than later.  Discussed warning signs or  symptoms. Please see discharge instructions. Patient expresses understanding.    Gregor Hams, MD 01/06/14 1341

## 2014-01-06 NOTE — Discharge Instructions (Signed)
Thank you for coming in today. I think you have inflammation of your pancreas.  This problem will worsen if you eat food that is high in fat. Follow a strict low fat diet for at least 1 week.  If your pain worsens go directly to the ER.  Avoid alcohol. Try also taking protonix daily.  See your doctor soon.  If your belly pain worsens, or you have high fever, bad vomiting, blood in your stool or black tarry stool go to the Emergency Room.   Acute Pancreatitis Acute pancreatitis is a disease in which the pancreas becomes suddenly inflamed. The pancreas is a large gland located behind your stomach. The pancreas produces enzymes that help digest food. The pancreas also releases the hormones glucagon and insulin that help regulate blood sugar. Damage to the pancreas occurs when the digestive enzymes from the pancreas are activated and begin attacking the pancreas before being released into the intestine. Most acute attacks last a couple of days and can cause serious complications. Some people become dehydrated and develop low blood pressure. In severe cases, bleeding into the pancreas can lead to shock and can be life-threatening. The lungs, heart, and kidneys may fail. CAUSES  Pancreatitis can happen to anyone. In some cases, the cause is unknown. Most cases are caused by:  Alcohol abuse.  Gallstones. Other less common causes are:  Certain medicines.  Exposure to certain chemicals.  Infection.  Damage caused by an accident (trauma).  Abdominal surgery. SYMPTOMS   Pain in the upper abdomen that may radiate to the back.  Tenderness and swelling of the abdomen.  Nausea and vomiting. DIAGNOSIS  Your caregiver will perform a physical exam. Blood and stool tests may be done to confirm the diagnosis. Imaging tests may also be done, such as X-rays, CT scans, or an ultrasound of the abdomen. TREATMENT  Treatment usually requires a stay in the hospital. Treatment may include:  Pain  medicine.  Fluid replacement through an intravenous line (IV).  Placing a tube in the stomach to remove stomach contents and control vomiting.  Not eating for 3 or 4 days. This gives your pancreas a rest, because enzymes are not being produced that can cause further damage.  Antibiotic medicines if your condition is caused by an infection.  Surgery of the pancreas or gallbladder. HOME CARE INSTRUCTIONS   Follow the diet advised by your caregiver. This may involve avoiding alcohol and decreasing the amount of fat in your diet.  Eat smaller, more frequent meals. This reduces the amount of digestive juices the pancreas produces.  Drink enough fluids to keep your urine clear or pale yellow.  Only take over-the-counter or prescription medicines as directed by your caregiver.  Avoid drinking alcohol if it caused your condition.  Do not smoke.  Get plenty of rest.  Check your blood sugar at home as directed by your caregiver.  Keep all follow-up appointments as directed by your caregiver. SEEK MEDICAL CARE IF:   You do not recover as quickly as expected.  You develop new or worsening symptoms.  You have persistent pain, weakness, or nausea.  You recover and then have another episode of pain. SEEK IMMEDIATE MEDICAL CARE IF:   You are unable to eat or keep fluids down.  Your pain becomes severe.  You have a fever or persistent symptoms for more than 2 to 3 days.  You have a fever and your symptoms suddenly get worse.  Your skin or the white part of your eyes  turn yellow (jaundice).  You develop vomiting.  You feel dizzy, or you faint.  Your blood sugar is high (over 300 mg/dL). MAKE SURE YOU:   Understand these instructions.  Will watch your condition.  Will get help right away if you are not doing well or get worse. Document Released: 09/24/2005 Document Revised: 03/25/2012 Document Reviewed: 01/03/2012 [facility] Patient Information 2014 Vincent [name].

## 2014-01-18 ENCOUNTER — Ambulatory Visit (INDEPENDENT_AMBULATORY_CARE_PROVIDER_SITE_OTHER): Payer: 59 | Admitting: Cardiovascular Disease

## 2014-01-18 ENCOUNTER — Encounter: Payer: Self-pay | Admitting: Cardiovascular Disease

## 2014-01-18 VITALS — BP 112/74 | HR 69 | Ht 69.0 in | Wt 261.0 lb

## 2014-01-18 DIAGNOSIS — R079 Chest pain, unspecified: Secondary | ICD-10-CM

## 2014-01-18 DIAGNOSIS — I10 Essential (primary) hypertension: Secondary | ICD-10-CM

## 2014-01-18 DIAGNOSIS — E785 Hyperlipidemia, unspecified: Secondary | ICD-10-CM

## 2014-01-18 DIAGNOSIS — I2510 Atherosclerotic heart disease of native coronary artery without angina pectoris: Secondary | ICD-10-CM

## 2014-01-18 NOTE — Patient Instructions (Signed)
Your physician wants you to follow-up in:  12 months. You will receive a reminder letter in the mail two months in advance. If you don't receive a letter, please call our office to schedule the follow-up appointment.  Your physician has requested that you have a lexiscan myoview. For further information please visit [email or web address]. Please follow instruction sheet, as given.

## 2014-01-18 NOTE — Progress Notes (Signed)
History of Present Illness: 63 yo male with history of CAD, HTN, HLD who is here today for cardiac follow up. He has been followed in the past by Dr. Lia Foyer. He had an anterior MI in 2007 with stent placed in LAD at that time. Last cath 06/23/10 per Dr. Lia Foyer with 30-40% proximal LAD restenosis, 30% mid LAD, 40% mid Circumflex, no RCA disease, LVEF=45-50%.   He is here today for follow up. He has been doing well. He has occasional chest pains with exertion. 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   Primary Care Physician:  Lang Snow  Last Lipid Profile: Followed in primary care  Past Medical History  Diagnosis Date  . Coronary artery disease   . Hypertension   . Hyperlipidemia   . Hemorrhoids   . Diverticulosis     mild  . Hx of colonic polyps   . Left ventricular dysfunction     hx of    Past Surgical History  Procedure Laterality Date  . Angioplasty      and bare metal stent placement    Current Outpatient Prescriptions  Medication Sig Dispense Refill  . aspirin 81 MG tablet Take 81 mg by mouth daily.        . clonazePAM (KLONOPIN) 1 MG tablet Take 1 mg by mouth 2 (two) times daily as needed.       . furosemide (LASIX) 40 MG tablet Take 40 mg by mouth 2 (two) times daily.        Marland Kitchen loratadine (CLARITIN) 10 MG tablet Take 10 mg by mouth daily.      . meloxicam (MOBIC) 7.5 MG tablet Take 1 tablet (7.5 mg total) by mouth 2 (two) times daily as needed.  60 tablet  0  . metFORMIN (GLUCOPHAGE) 500 MG tablet Take 500 mg by mouth at bedtime.       . metoprolol (LOPRESSOR) 50 MG tablet Take 1 tablet (50 mg total) by mouth 2 (two) times daily.  60 tablet  6  . nitroGLYCERIN (NITROSTAT) 0.4 MG SL tablet Place 1 tablet (0.4 mg total) under the tongue every 5 (five) minutes as needed.  25 tablet  6  . pantoprazole (PROTONIX) 40 MG tablet Take 1 tablet (40 mg total) by mouth daily.  30 tablet  1  . ramipril (ALTACE) 2.5 MG tablet Take 2.5 mg by mouth daily.        .  simvastatin (ZOCOR) 40 MG tablet Take 1 tablet (40 mg total) by mouth at bedtime.  90 tablet  3  . sitaGLIPtin (JANUVIA) 100 MG tablet Take 100 mg by mouth daily.      . chlorpheniramine-HYDROcodone (TUSSIONEX PENNKINETIC ER) 10-8 MG/5ML LQCR Take 5 mLs by mouth every 12 (twelve) hours as needed for cough.  50 mL  0  . clindamycin (CLEOCIN) 300 MG capsule Take 1 capsule (300 mg total) by mouth 4 (four) times daily.  40 capsule  0  . doxycycline (VIBRAMYCIN) 100 MG capsule Take 1 capsule (100 mg total) by mouth 2 (two) times daily.  20 capsule  0  . oxyCODONE-acetaminophen (PERCOCET) 5-325 MG per tablet 1 to 2 tablets every 6 hours as needed for pain.  20 tablet  0  . traMADol (ULTRAM) 50 MG tablet Take 1 tablet by mouth every 8 (eight) hours as needed.       No current facility-administered medications for this visit.    Allergies  Allergen Reactions  . Penicillins  History   Social History  . Marital Status: Married    Spouse Name: N/A    Number of Children: N/A  . Years of Education: N/A   Occupational History  . full time [employer] account executive   Social History Main Topics  . Smoking status: Never Smoker   . Smokeless tobacco: Not on file  . Alcohol Use: No  . Drug Use: No  . Sexual Activity: Not on file   Other Topics Concern  . Not on file   Social History Narrative  . No narrative on file    Family History  Problem Relation Age of Onset  . Coronary artery disease      positive family hx of    Review of Systems:  As stated in the HPI and otherwise negative.   BP 112/74  Pulse 69  Ht 5\' 9"  (1.753 m)  Wt 261 lb (118.389 kg)  BMI 38.53 kg/m2  Physical Examination: General: Well developed, well nourished, NAD HEENT: OP clear, mucus membranes moist SKIN: warm, dry. No rashes. Neuro: No focal deficits Musculoskeletal: Muscle strength 5/5 all ext Psychiatric: Mood and affect normal Neck: No JVD, no carotid bruits, no thyromegaly, no  lymphadenopathy. Lungs:Clear bilaterally, no wheezes, rhonci, crackles Cardiovascular: Regular rate and rhythm. No murmurs, gallops or rubs. Abdomen:Soft. Bowel sounds present. Non-tender.  Extremities: No lower extremity edema. Pulses are 2 + in the bilateral DP/PT.  EKG: NSR, rate 69 bpm. Septal infarct.   Assessment and Plan:   1. CAD: Stable. Continue ASA, statin, Ace-inh, beta blocker. He has had occasional chest pains. Will arrange Lexiscan stress myoview to exclude ischemia.   2. HTN: BP controlled. No changes.   3. HLD: He is on a statin. Lipids followed in primary care.   4. Chest pain: as above. Plan stress test.

## 2014-01-26 ENCOUNTER — Encounter: Payer: Self-pay | Admitting: Cardiology

## 2014-02-01 ENCOUNTER — Ambulatory Visit (HOSPITAL_COMMUNITY): Payer: 59 | Attending: Internal Medicine | Admitting: Radiology

## 2014-02-01 ENCOUNTER — Encounter: Payer: Self-pay | Admitting: Internal Medicine

## 2014-02-01 VITALS — BP 119/48 | HR 68 | Ht 69.0 in | Wt 262.0 lb

## 2014-02-01 DIAGNOSIS — E119 Type 2 diabetes mellitus without complications: Secondary | ICD-10-CM | POA: Insufficient documentation

## 2014-02-01 DIAGNOSIS — Z9861 Coronary angioplasty status: Secondary | ICD-10-CM | POA: Insufficient documentation

## 2014-02-01 DIAGNOSIS — Z8249 Family history of ischemic heart disease and other diseases of the circulatory system: Secondary | ICD-10-CM | POA: Insufficient documentation

## 2014-02-01 DIAGNOSIS — I2510 Atherosclerotic heart disease of native coronary artery without angina pectoris: Secondary | ICD-10-CM | POA: Insufficient documentation

## 2014-02-01 DIAGNOSIS — I10 Essential (primary) hypertension: Secondary | ICD-10-CM | POA: Insufficient documentation

## 2014-02-01 DIAGNOSIS — I252 Old myocardial infarction: Secondary | ICD-10-CM | POA: Insufficient documentation

## 2014-02-01 DIAGNOSIS — R079 Chest pain, unspecified: Secondary | ICD-10-CM

## 2014-02-01 MED ORDER — TECHNETIUM TC 99M SESTAMIBI GENERIC - CARDIOLITE
11.0000 | Freq: Once | INTRAVENOUS | Status: AC | PRN
  Administered 2014-02-01: 11 via INTRAVENOUS

## 2014-02-01 MED ORDER — REGADENOSON 0.4 MG/5ML IV SOLN
0.4000 mg | Freq: Once | INTRAVENOUS | Status: AC
  Administered 2014-02-01: 0.4 mg via INTRAVENOUS

## 2014-02-01 MED ORDER — TECHNETIUM TC 99M SESTAMIBI GENERIC - CARDIOLITE
33.0000 | Freq: Once | INTRAVENOUS | Status: AC | PRN
  Administered 2014-02-01: 33 via INTRAVENOUS

## 2014-02-01 NOTE — Progress Notes (Signed)
[facility] [place], [place] [address] [phone]    Cardiology Nuclear Med Study  Vincent [name] is a 64 y.o. male     MRN : [id number]     DOB: 01-23-1951  Procedure Date: 02/01/2014  Nuclear Med Background Indication for Stress Test:  Evaluation for Ischemia and Stent Patency History:  CAD, MI, Cath, Stent (LAD with res. dz.), Echo 2009 EF 65%, MPI 2010 EF 42% (peri infarct ischemia) Cardiac Risk Factors: Family History - CAD, Hypertension, Lipids and NIDDM  Symptoms:  Chest Pain   Nuclear Pre-Procedure Caffeine/Decaff Intake:  11:30pm NPO After: 11:30pm   Lungs:  clear O2 Sat: 93% on room air. IV 0.9% NS with Angio Cath:  20g  IV Site: R Hand  IV Started by:  Matilde Haymaker, RN  Chest Size (in):  56 Cup Size: n/a  Height: 5\' 9"  (1.753 m)  Weight:  262 lb (118.842 kg)  BMI:  Body mass index is 38.67 kg/(m^2). Tech Comments:  Metoprolol taken at 1030 today    Nuclear Med Study 1 or 2 day study: 1 day  Stress Test Type:  Carlton Adam  Reading MD: n/a  Order Authorizing Provider:  Gennette Pac  Resting Radionuclide: Technetium 22m Sestamibi  Resting Radionuclide Dose: 11.0 mCi   Stress Radionuclide:  Technetium 36m Sestamibi  Stress Radionuclide Dose: 33.0 mCi           Stress Protocol Rest HR: 68 Stress HR: 82  Rest BP: 119/48 Stress BP: 127/62  Exercise Time (min): n/a METS: n/a           Dose of Adenosine (mg):  n/a Dose of Lexiscan: 0.4 mg  Dose of Atropine (mg): n/a Dose of Dobutamine: n/a mcg/kg/min (at max HR)  Stress Test Technologist: Glade Lloyd, BS-ES  Nuclear Technologist:  Charlton Amor, CNMT     Rest Procedure:  Myocardial perfusion imaging was performed at rest 45 minutes following the intravenous administration of Technetium 50m Sestamibi. Rest ECG: Sinus rhythm, 68 with nonspecific ST changes  Stress Procedure:  The patient received IV Lexiscan 0.4 mg over 15-seconds.  Technetium  49m Sestamibi injected at 30-seconds.  Quantitative spect images were obtained after a 45 minute delay.  During the infusion of Lexiscan, the patient complained of feeling flushed, lightheaded, strange sensation, nauseated, chest tightness and a headache.  Symptoms began to resolve with the exception of the headache.  Stress ECG: No significant change from baseline ECG  QPS Raw Data Images:  Mild diaphragmatic attenuation.  Normal left ventricular size. Stress Images:  Moderate-sized apical defect, severe, fixed consistent with infarct. Otherwise, homogeneous radiotracer uptake Rest Images:  Moderate sized apical defect consistent with infarct, otherwise normal radiotracer uptake Subtraction (SDS):  No evidence of ischemia. Transient Ischemic Dilatation (Normal <1.22):  1.03 Lung/Heart Ratio (Normal <0.45):  0.43  Quantitative Gated Spect Images QGS EDV:  155 ml QGS ESV:  88 ml  Impression Exercise Capacity:  Lexiscan with no exercise. BP Response:  Normal blood pressure response. Clinical Symptoms:  Flushing, lightheaded, chest tightness, headache typical with Lexiscan. ECG Impression:  No significant ST segment change suggestive of ischemia. Comparison with Prior Nuclear Study: No images to compare  Overall Impression:  Intermediate risk stress nuclear study with moderate sized apical defect, infarct pattern. There is no evidence of significant ischemia identified.  LV Ejection Fraction: 43%.  LV Wall Motion:  Apical akinesis with mild generalized hypokinesis.  Candee Furbish, MD

## 2014-03-11 ENCOUNTER — Encounter (HOSPITAL_COMMUNITY): Payer: Self-pay | Admitting: Emergency Medicine

## 2014-03-11 ENCOUNTER — Emergency Department (INDEPENDENT_AMBULATORY_CARE_PROVIDER_SITE_OTHER): Payer: 59

## 2014-03-11 ENCOUNTER — Emergency Department (HOSPITAL_COMMUNITY)
Admission: EM | Admit: 2014-03-11 | Discharge: 2014-03-11 | Disposition: A | Discharge status: Home / Self Care | Payer: 59 | Source: Home / Self Care | Attending: Family Medicine | Admitting: Family Medicine

## 2014-03-11 DIAGNOSIS — IMO0002 Reserved for concepts with insufficient information to code with codable children: Secondary | ICD-10-CM

## 2014-03-11 DIAGNOSIS — S86911A Strain of unspecified muscle(s) and tendon(s) at lower leg level, right leg, initial encounter: Secondary | ICD-10-CM

## 2014-03-11 DIAGNOSIS — X58XXXA Exposure to other specified factors, initial encounter: Secondary | ICD-10-CM

## 2014-03-11 HISTORY — DX: Acute myocardial infarction, unspecified: I21.9

## 2014-03-11 IMAGING — CR DG KNEE COMPLETE 4+V*R*
5 series · 5 of 5 positions shown · non-contrast
Comparison: [DATE] radiographs

CLINICAL DATA: 62-year-old male with right knee pain following
fall.

EXAM:
RIGHT KNEE - COMPLETE 4+ VIEW

[view not recorded (1 of 5)]
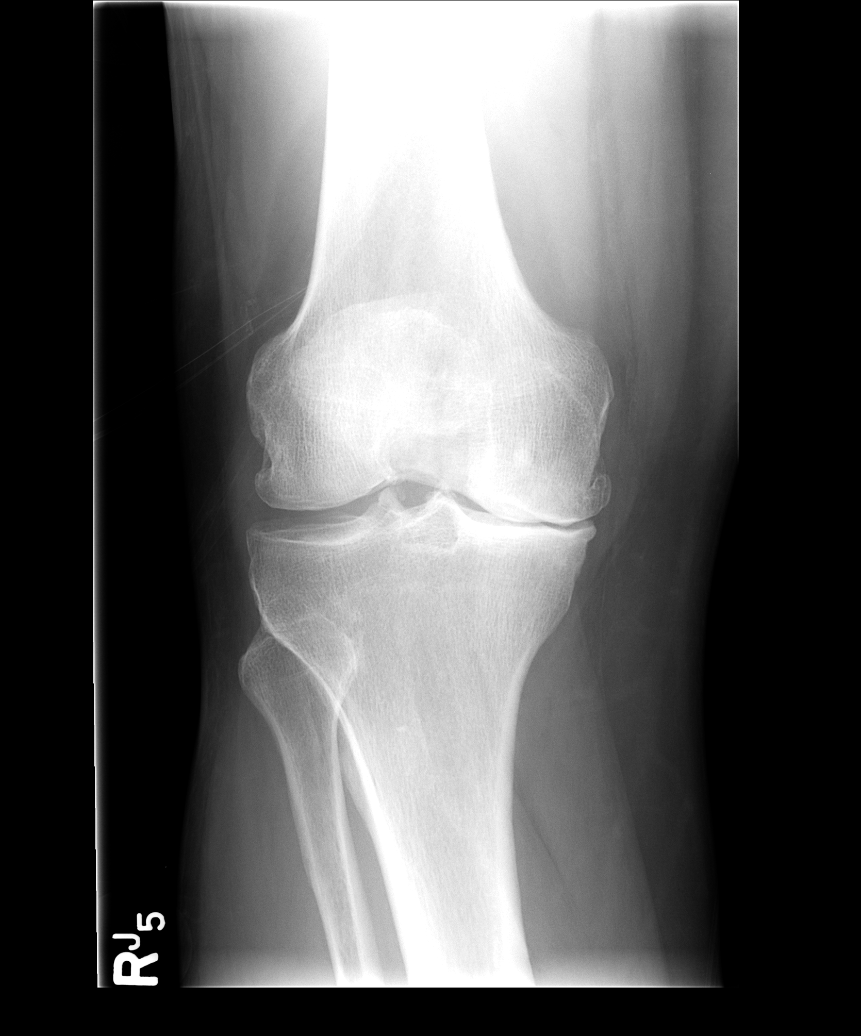

[view not recorded (2 of 5)]
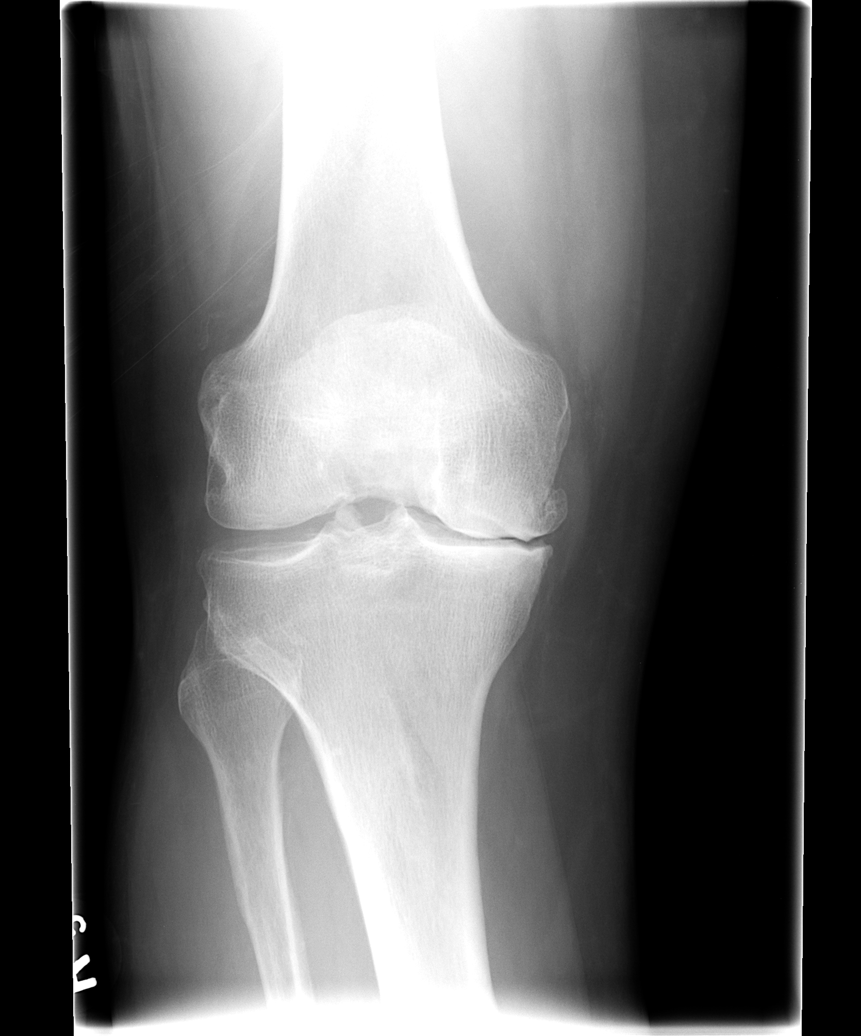

[view not recorded (3 of 5)]
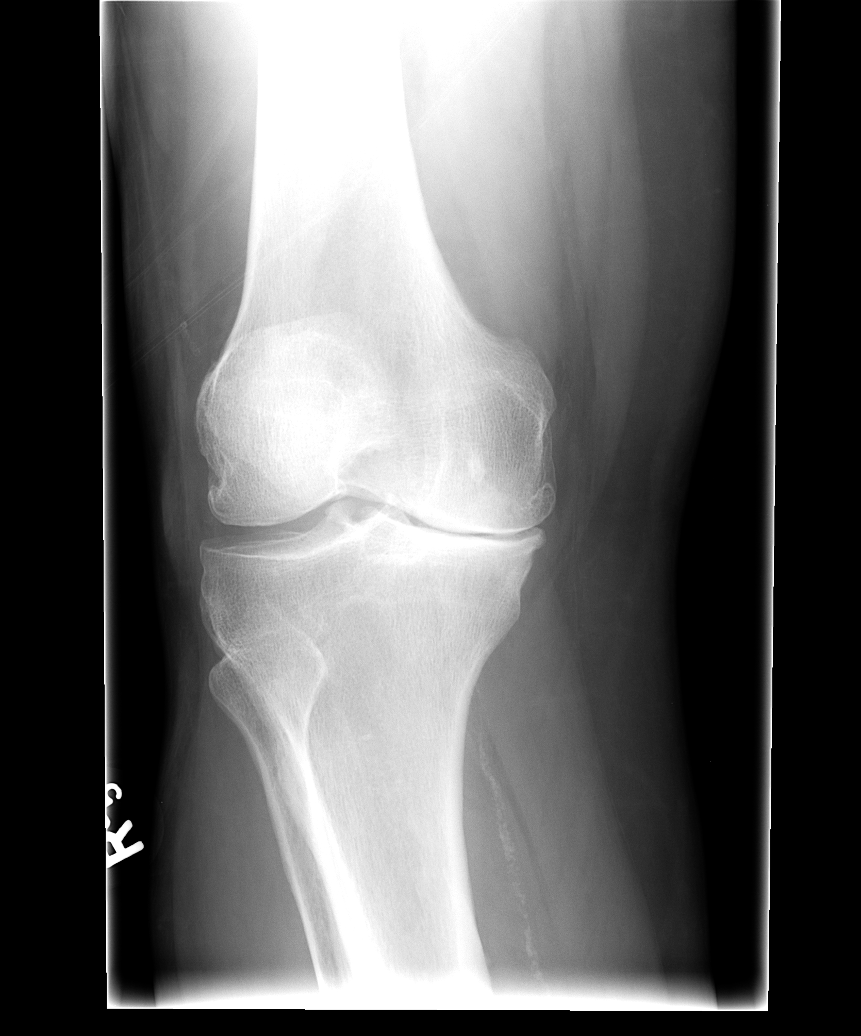

[view not recorded (4 of 5)]
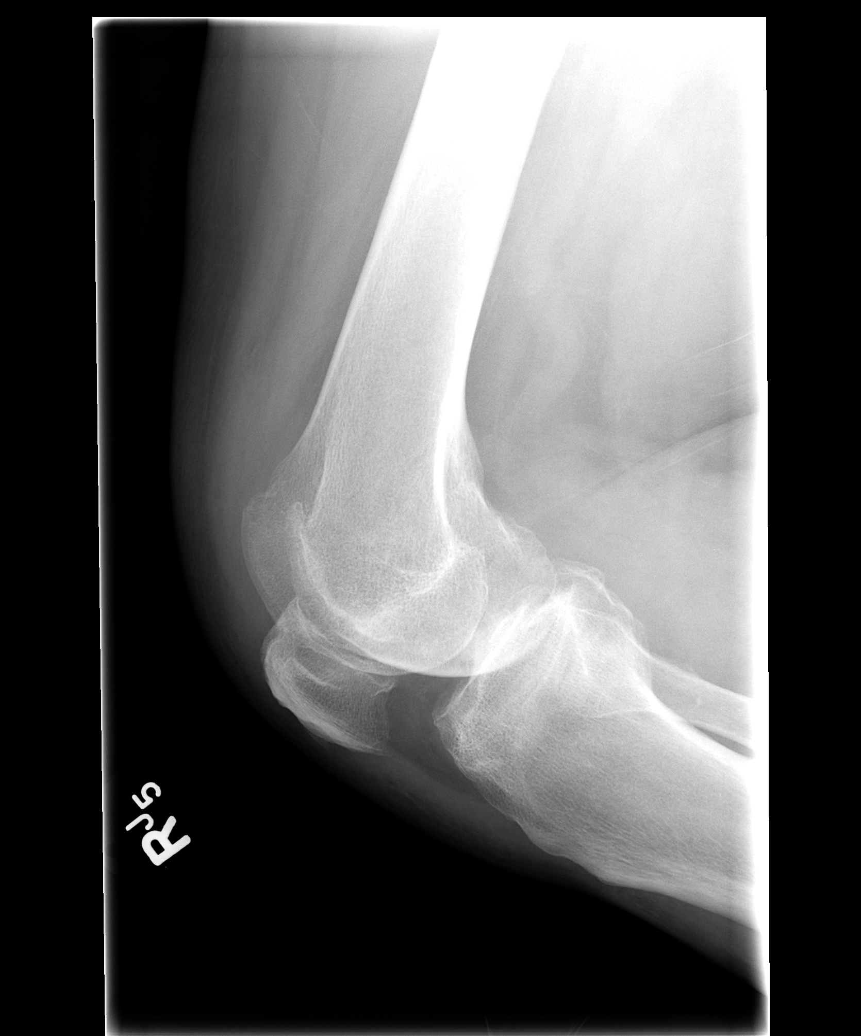

[view not recorded (5 of 5)]
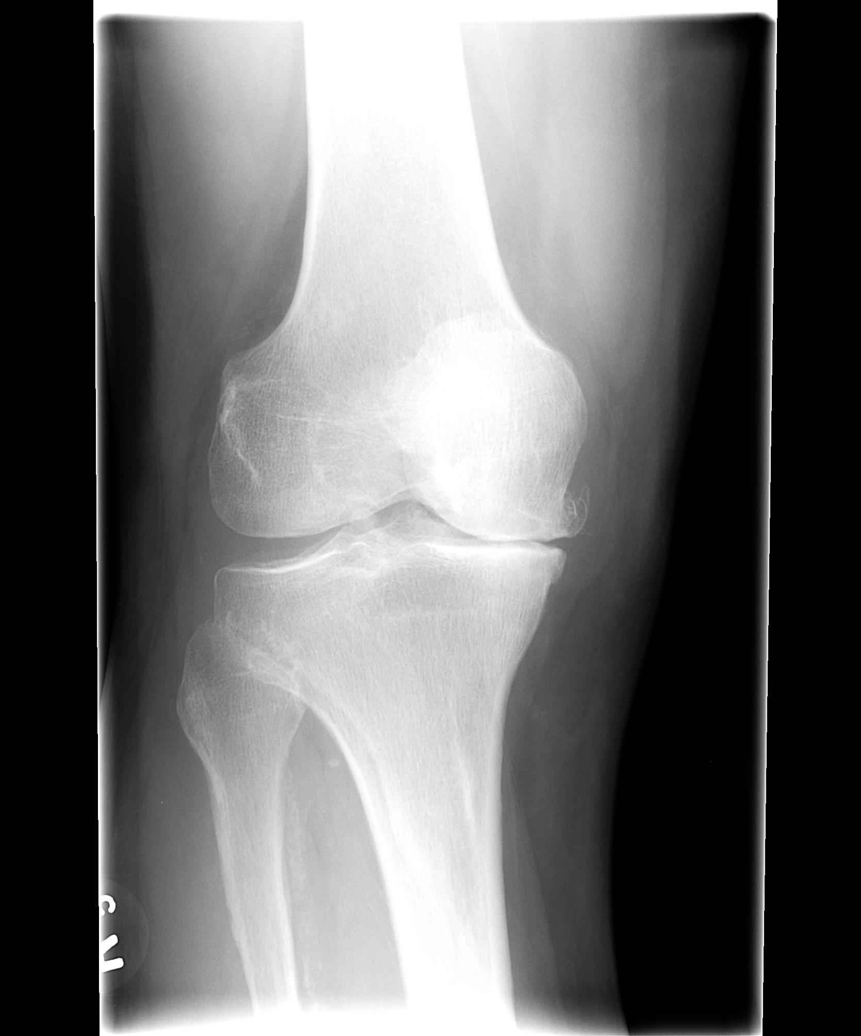

[5 of 5 positions shown; findings below may reference images not displayed]

FINDINGS: There is no evidence of acute fracture, subluxation or dislocation.

Tricompartmental degenerative changes are identified, severe in the
medial compartment.

No definite knee effusion is noted.

No focal bony lesions are present.
IMPRESSION: No evidence of acute bony abnormality.

Tricompartmental degenerative changes, severe in the medial
compartment.

## 2014-03-11 MED ORDER — HYDROCODONE-ACETAMINOPHEN 5-325 MG PO TABS: 1.0000 | ORAL_TABLET | Freq: Four times a day (QID) | ORAL | Status: DC | PRN
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
## 2014-03-11 NOTE — ED Provider Notes (Signed)
CSN: [id number]     Arrival date & time 03/11/14  1810 History   First MD Initiated Contact with Patient 03/11/14 1820     Chief Complaint  Patient presents with  . Knee Injury   (Consider location/radiation/quality/duration/timing/severity/associated sxs/prior Treatment) Patient is a 63 y.o. male presenting with knee pain.  Knee Pain Location:  Knee Time since incident:  1 day Injury: yes   Mechanism of injury: fall   Mechanism of injury comment:  Tripped over pit bull and cage Fall:    Point of impact:  Knees   Entrapped after fall: no   Knee location:  R knee Pain details:    Severity:  Mild   Progression:  Unchanged Chronicity:  New Dislocation: no   Associated symptoms: decreased ROM     Past Medical History  Diagnosis Date  . Coronary artery disease   . Hypertension   . Hyperlipidemia   . Hemorrhoids   . Diverticulosis     mild  . Hx of colonic polyps   . Left ventricular dysfunction     hx of  . Acute MI    Past Surgical History  Procedure Laterality Date  . Angioplasty      and bare metal stent placement  . Coronary angioplasty with stent placement     Family History  Problem Relation Age of Onset  . Coronary artery disease      positive family hx of   History  Substance Use Topics  . Smoking status: Current Every Day Smoker    Types: Cigars  . Smokeless tobacco: Not on file  . Alcohol Use: No    Review of Systems  Constitutional: Negative.   Musculoskeletal: Positive for gait problem. Negative for joint swelling.  Skin: Negative for wound.    Allergies  Penicillins  Home Medications   Prior to Admission medications   Medication Sig Start Date End Date Taking? Authorizing Provider  aspirin 81 MG tablet Take 81 mg by mouth daily.     Yes Historical Provider, MD  clonazePAM (KLONOPIN) 1 MG tablet Take 1 mg by mouth 2 (two) times daily as needed.    Yes Historical Provider, MD  furosemide (LASIX) 40 MG tablet Take 40 mg by mouth 2 (two)  times daily.     Yes Historical Provider, MD  loratadine (CLARITIN) 10 MG tablet Take 10 mg by mouth daily.   Yes Historical Provider, MD  meloxicam (MOBIC) 7.5 MG tablet Take 1 tablet (7.5 mg total) by mouth 2 (two) times daily as needed. 03/24/12  Yes Adlih Moreno-Coll, MD  metFORMIN (GLUCOPHAGE) 500 MG tablet Take 500 mg by mouth at bedtime.    Yes Historical Provider, MD  metoprolol (LOPRESSOR) 50 MG tablet Take 1 tablet (50 mg total) by mouth 2 (two) times daily. 12/01/13  Yes Burtis Junes, NP  nitroGLYCERIN (NITROSTAT) 0.4 MG SL tablet Place 1 tablet (0.4 mg total) under the tongue every 5 (five) minutes as needed. 04/06/13  Yes Burnell Blanks, MD  simvastatin (ZOCOR) 40 MG tablet Take 1 tablet (40 mg total) by mouth at bedtime. 02/23/13  Yes Burnell Blanks, MD  sitaGLIPtin (JANUVIA) 100 MG tablet Take 100 mg by mouth daily.   Yes Historical Provider, MD  traMADol (ULTRAM) 50 MG tablet Take 1 tablet by mouth every 8 (eight) hours as needed. 01/01/14  Yes Historical Provider, MD  HYDROcodone-acetaminophen (NORCO/VICODIN) 5-325 MG per tablet Take 1 tablet by mouth every 6 (six) hours as needed for moderate pain  or severe pain. 03/11/14   Billy Fischer, MD  oxyCODONE-acetaminophen (PERCOCET) 5-325 MG per tablet 1 to 2 tablets every 6 hours as needed for pain. 10/14/13   Harden Mo, MD  pantoprazole (PROTONIX) 40 MG tablet Take 1 tablet (40 mg total) by mouth daily. 01/06/14   Gregor Hams, MD  ramipril (ALTACE) 2.5 MG tablet Take 2.5 mg by mouth daily.      Historical Provider, MD   BP 122/80  Pulse 84  Temp(Src) 98.1 F (36.7 C) (Oral)  Resp 22  SpO2 94% Physical Exam  Nursing note and vitals reviewed. Constitutional: He is oriented to person, place, and time. He appears well-developed and well-nourished.  Musculoskeletal: He exhibits tenderness.       Legs: Neurological: He is alert and oriented to person, place, and time.  Skin: Skin is warm and dry.    ED Course    Procedures (including critical care time) Labs Review Labs Reviewed - No data to display  Imaging Review Dg Knee Complete 4 Views Right  03/11/2014   CLINICAL DATA:  63 year old male with right knee pain following fall.  EXAM: RIGHT KNEE - COMPLETE 4+ VIEW  COMPARISON:  03/24/2012 radiographs  FINDINGS: There is no evidence of acute fracture, subluxation or dislocation.  Tricompartmental degenerative changes are identified, severe in the medial compartment.  No definite knee effusion is noted.  No focal bony lesions are present.  IMPRESSION: No evidence of acute bony abnormality.  Tricompartmental degenerative changes, severe in the medial compartment.   Electronically Signed   By: Hassan Rowan M.D.   On: 03/11/2014 18:51     MDM   1. Strain of right knee        Billy Fischer, MD 03/12/14 [phone]

## 2014-03-11 NOTE — Discharge Instructions (Signed)
Ice, splint and crutches as needed to rest knee, return or see your doctor as needed.

## 2014-03-20 ENCOUNTER — Emergency Department (HOSPITAL_BASED_OUTPATIENT_CLINIC_OR_DEPARTMENT_OTHER): Payer: Worker's Compensation

## 2014-03-20 ENCOUNTER — Encounter (HOSPITAL_BASED_OUTPATIENT_CLINIC_OR_DEPARTMENT_OTHER): Payer: Self-pay | Admitting: Emergency Medicine

## 2014-03-20 ENCOUNTER — Ambulatory Visit (HOSPITAL_BASED_OUTPATIENT_CLINIC_OR_DEPARTMENT_OTHER): Payer: 59 | Attending: Emergency Medicine

## 2014-03-20 ENCOUNTER — Emergency Department (HOSPITAL_BASED_OUTPATIENT_CLINIC_OR_DEPARTMENT_OTHER)
Admission: EM | Admit: 2014-03-20 | Discharge: 2014-03-20 | Disposition: A | Discharge status: Home / Self Care | Payer: Worker's Compensation | Attending: Emergency Medicine | Admitting: Emergency Medicine

## 2014-03-20 DIAGNOSIS — I252 Old myocardial infarction: Secondary | ICD-10-CM | POA: Insufficient documentation

## 2014-03-20 DIAGNOSIS — Z880 Allergy status to penicillin: Secondary | ICD-10-CM | POA: Insufficient documentation

## 2014-03-20 DIAGNOSIS — F17200 Nicotine dependence, unspecified, uncomplicated: Secondary | ICD-10-CM | POA: Insufficient documentation

## 2014-03-20 DIAGNOSIS — R296 Repeated falls: Secondary | ICD-10-CM | POA: Insufficient documentation

## 2014-03-20 DIAGNOSIS — S8010XA Contusion of unspecified lower leg, initial encounter: Secondary | ICD-10-CM | POA: Insufficient documentation

## 2014-03-20 DIAGNOSIS — Z9861 Coronary angioplasty status: Secondary | ICD-10-CM | POA: Insufficient documentation

## 2014-03-20 DIAGNOSIS — Z860100 Personal history of colon polyps, unspecified: Secondary | ICD-10-CM | POA: Insufficient documentation

## 2014-03-20 DIAGNOSIS — T148XXA Other injury of unspecified body region, initial encounter: Secondary | ICD-10-CM

## 2014-03-20 DIAGNOSIS — I2510 Atherosclerotic heart disease of native coronary artery without angina pectoris: Secondary | ICD-10-CM | POA: Insufficient documentation

## 2014-03-20 DIAGNOSIS — I519 Heart disease, unspecified: Secondary | ICD-10-CM | POA: Insufficient documentation

## 2014-03-20 DIAGNOSIS — Y9389 Activity, other specified: Secondary | ICD-10-CM | POA: Insufficient documentation

## 2014-03-20 DIAGNOSIS — M1710 Unilateral primary osteoarthritis, unspecified knee: Secondary | ICD-10-CM

## 2014-03-20 DIAGNOSIS — IMO0002 Reserved for concepts with insufficient information to code with codable children: Secondary | ICD-10-CM

## 2014-03-20 DIAGNOSIS — E785 Hyperlipidemia, unspecified: Secondary | ICD-10-CM | POA: Insufficient documentation

## 2014-03-20 DIAGNOSIS — Y9289 Other specified places as the place of occurrence of the external cause: Secondary | ICD-10-CM | POA: Insufficient documentation

## 2014-03-20 DIAGNOSIS — Z8719 Personal history of other diseases of the digestive system: Secondary | ICD-10-CM | POA: Insufficient documentation

## 2014-03-20 DIAGNOSIS — Z79899 Other long term (current) drug therapy: Secondary | ICD-10-CM | POA: Insufficient documentation

## 2014-03-20 DIAGNOSIS — Y990 Civilian activity done for income or pay: Secondary | ICD-10-CM | POA: Insufficient documentation

## 2014-03-20 DIAGNOSIS — I10 Essential (primary) hypertension: Secondary | ICD-10-CM | POA: Insufficient documentation

## 2014-03-20 DIAGNOSIS — Z7982 Long term (current) use of aspirin: Secondary | ICD-10-CM | POA: Insufficient documentation

## 2014-03-20 IMAGING — CR DG KNEE COMPLETE 4+V*L*
4 series · 4 of 4 positions shown · non-contrast
Comparison: Prior radiograph from [DATE].

CLINICAL DATA: KNEE INJURY

EXAM:
LEFT KNEE - COMPLETE 4+ VIEW

[t knee ap left]
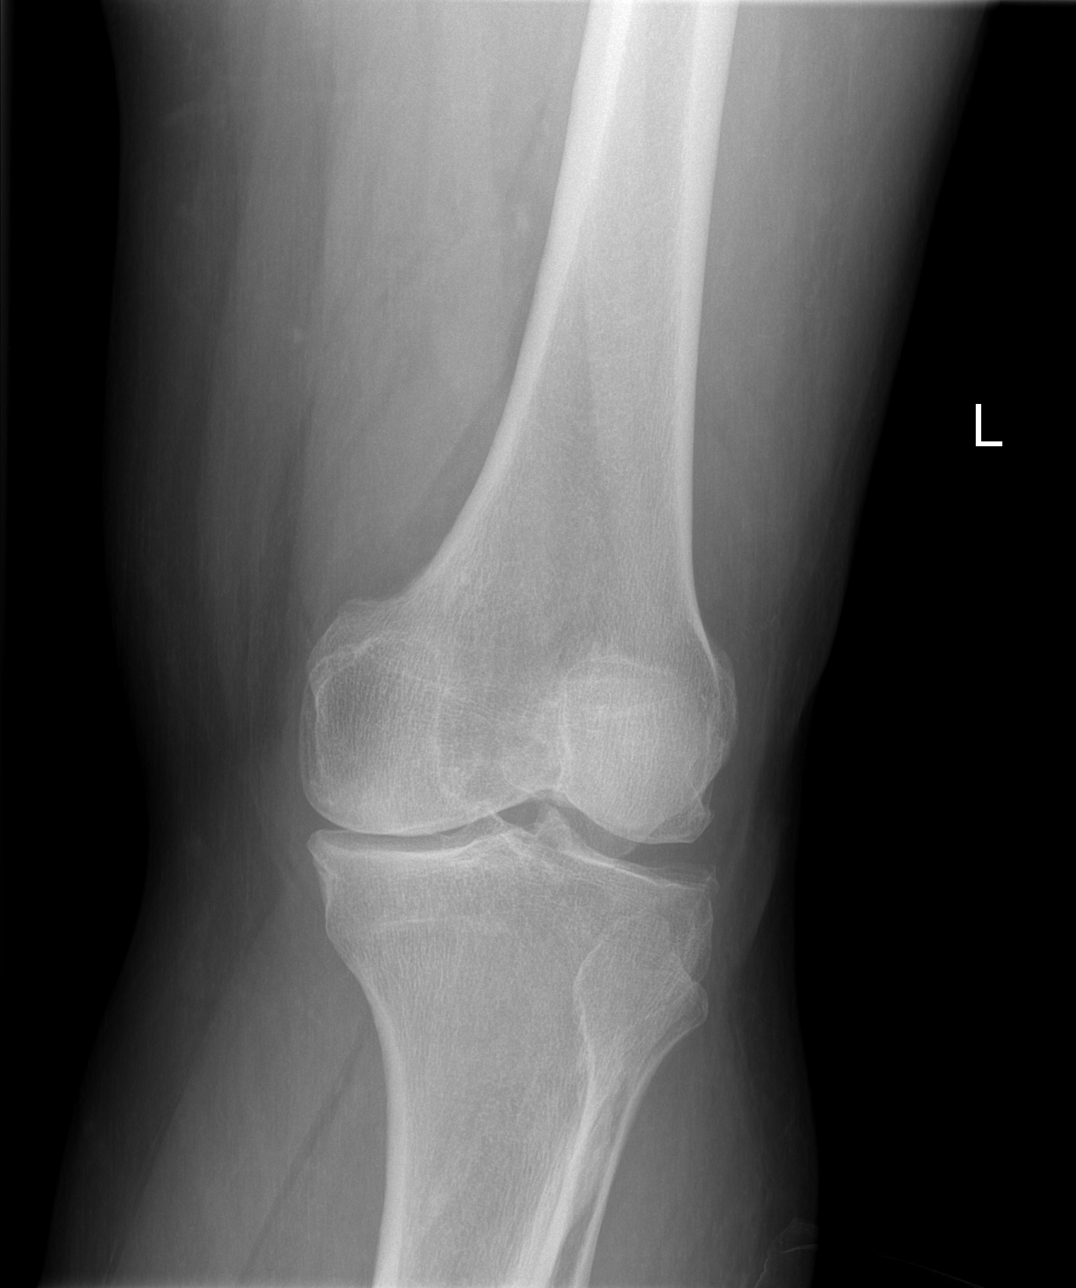

[t knee oblique left (1 of 2)]
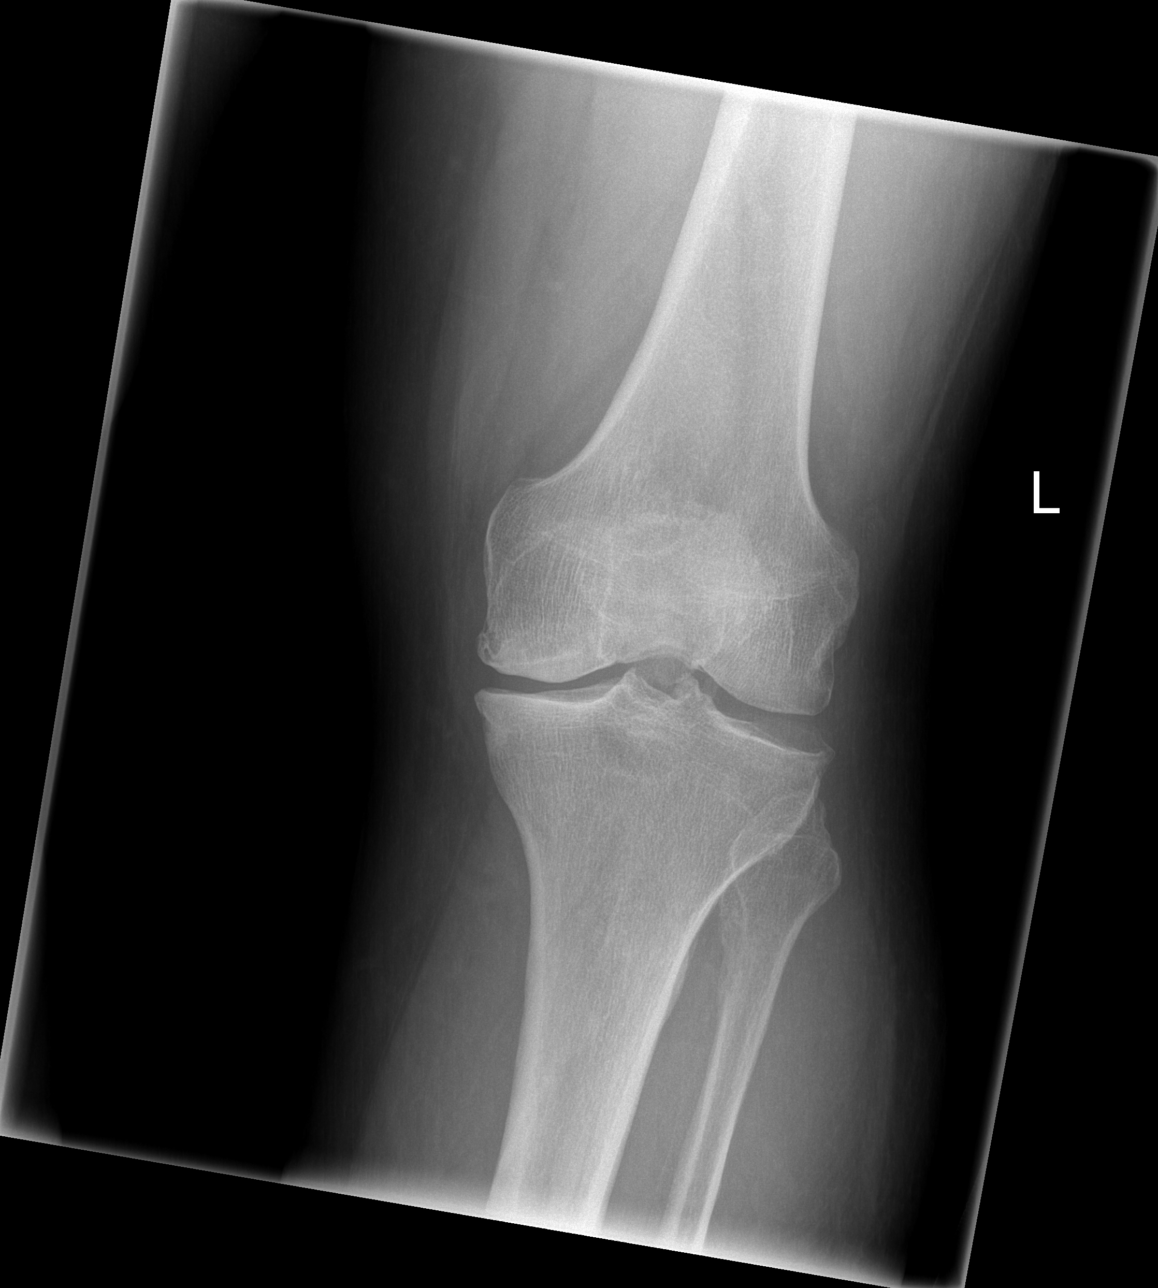

[t knee oblique left (2 of 2)]
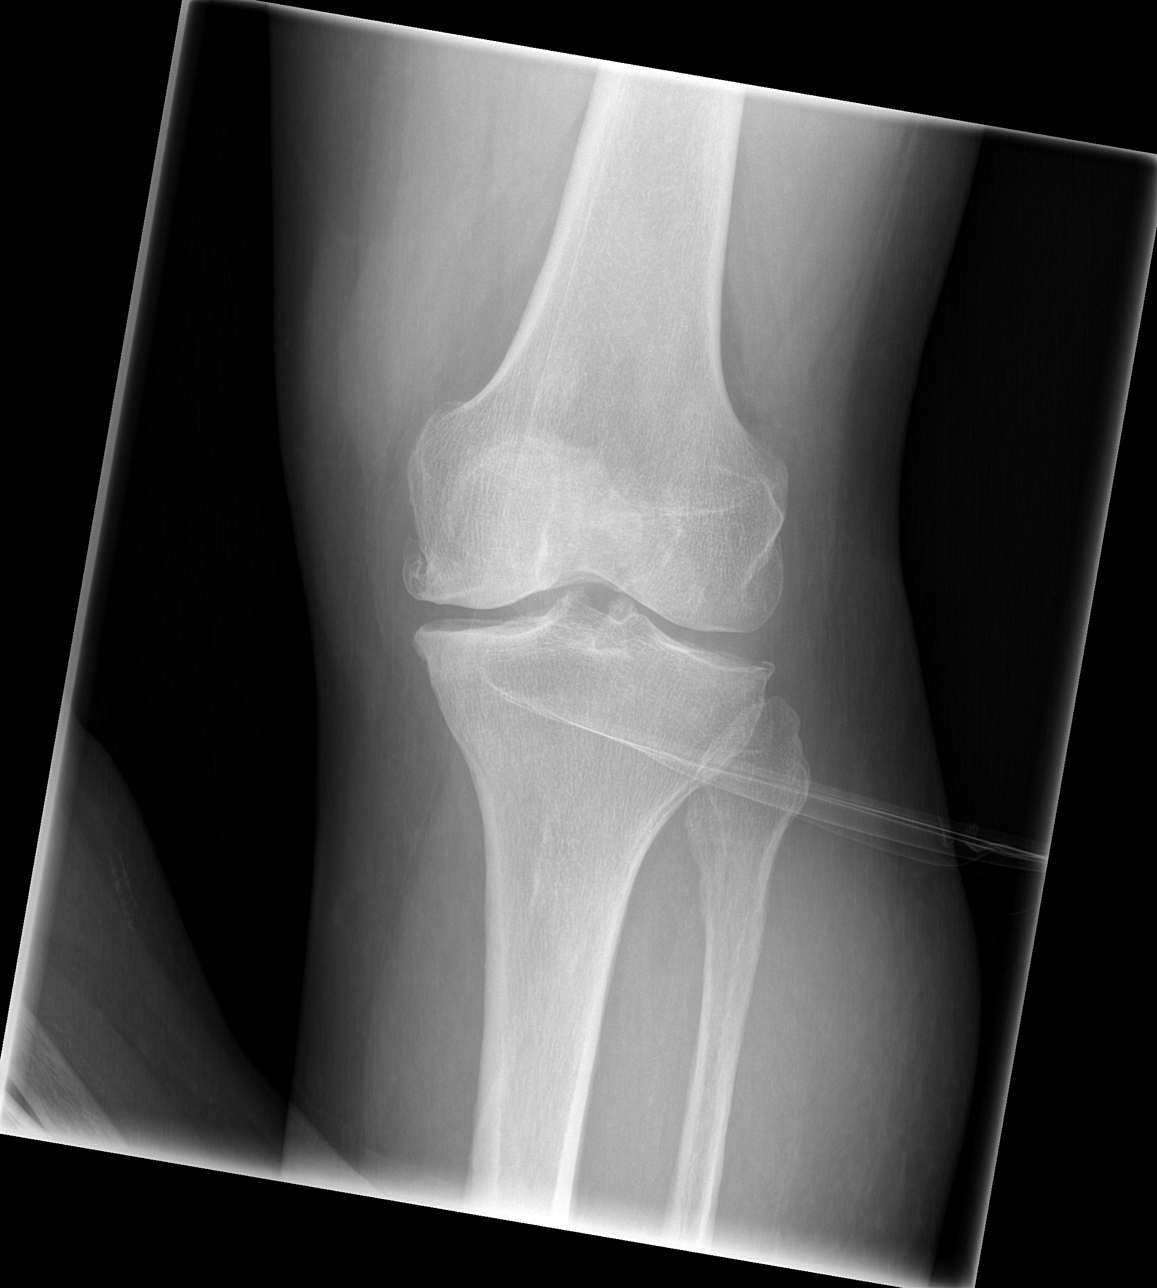

[view not recorded]
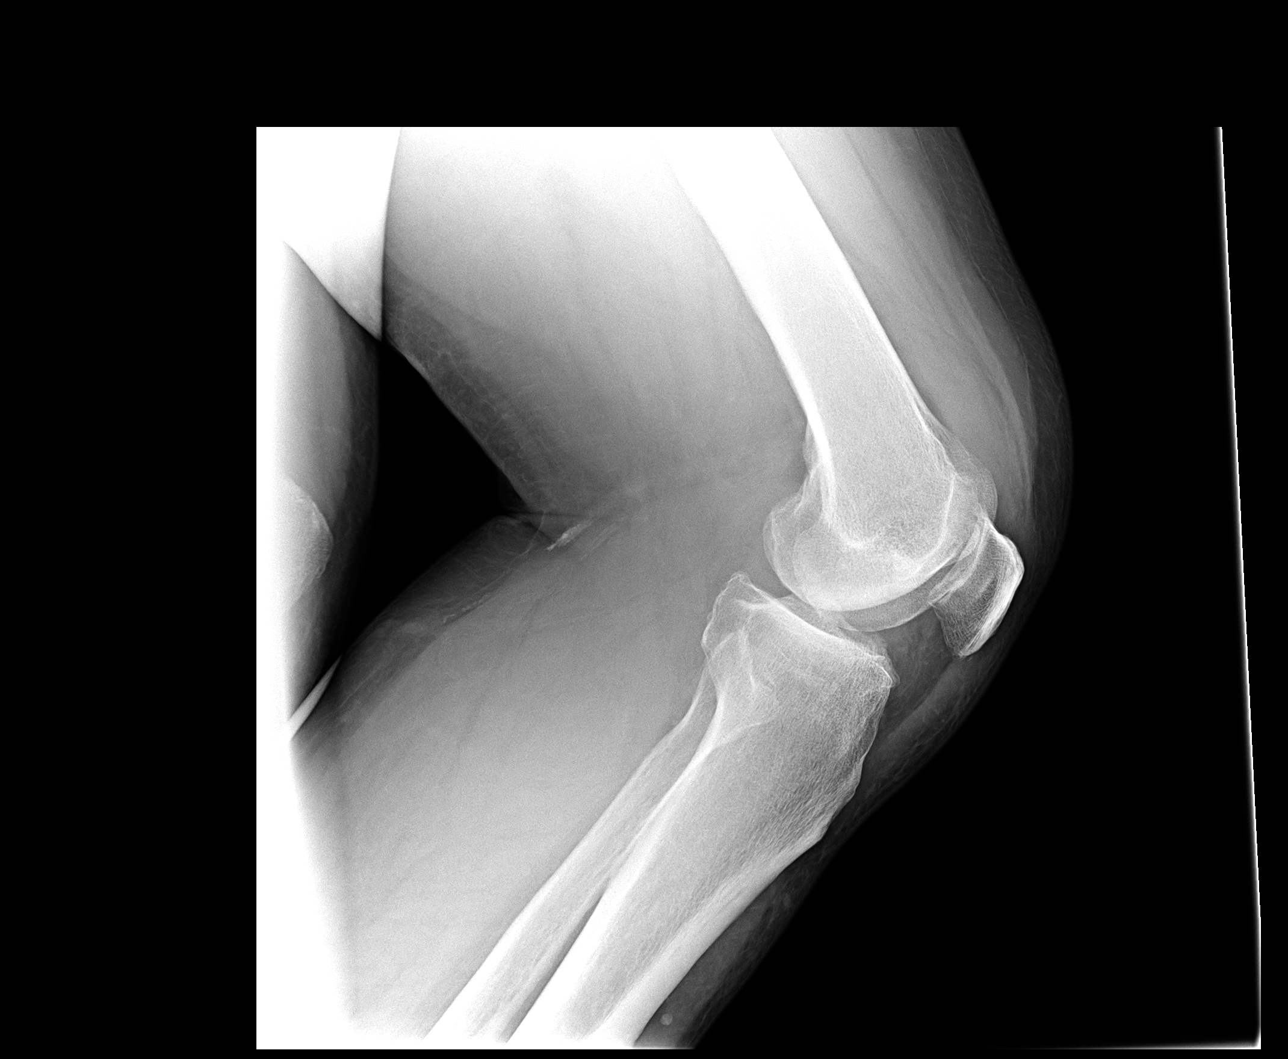

[4 of 4 positions shown; findings below may reference images not displayed]

FINDINGS: No acute fracture or dislocation. No joint effusion. Moderate
tricompartmental osteoarthrosis is present.

No soft tissue abnormality. Osseous mineralization within normal
limits.
IMPRESSION: 1. No acute fracture or dislocation.
2. Moderate tricompartmental osteoarthrosis.

## 2014-03-20 IMAGING — CR DG HIP (WITH OR WITHOUT PELVIS) 2-3V*L*
3 series · 3 of 3 positions shown · non-contrast
Comparison: None.

CLINICAL DATA: Left leg injury

EXAM:
LEFT HIP - COMPLETE 2+ VIEW

[t pelvis a.p.]
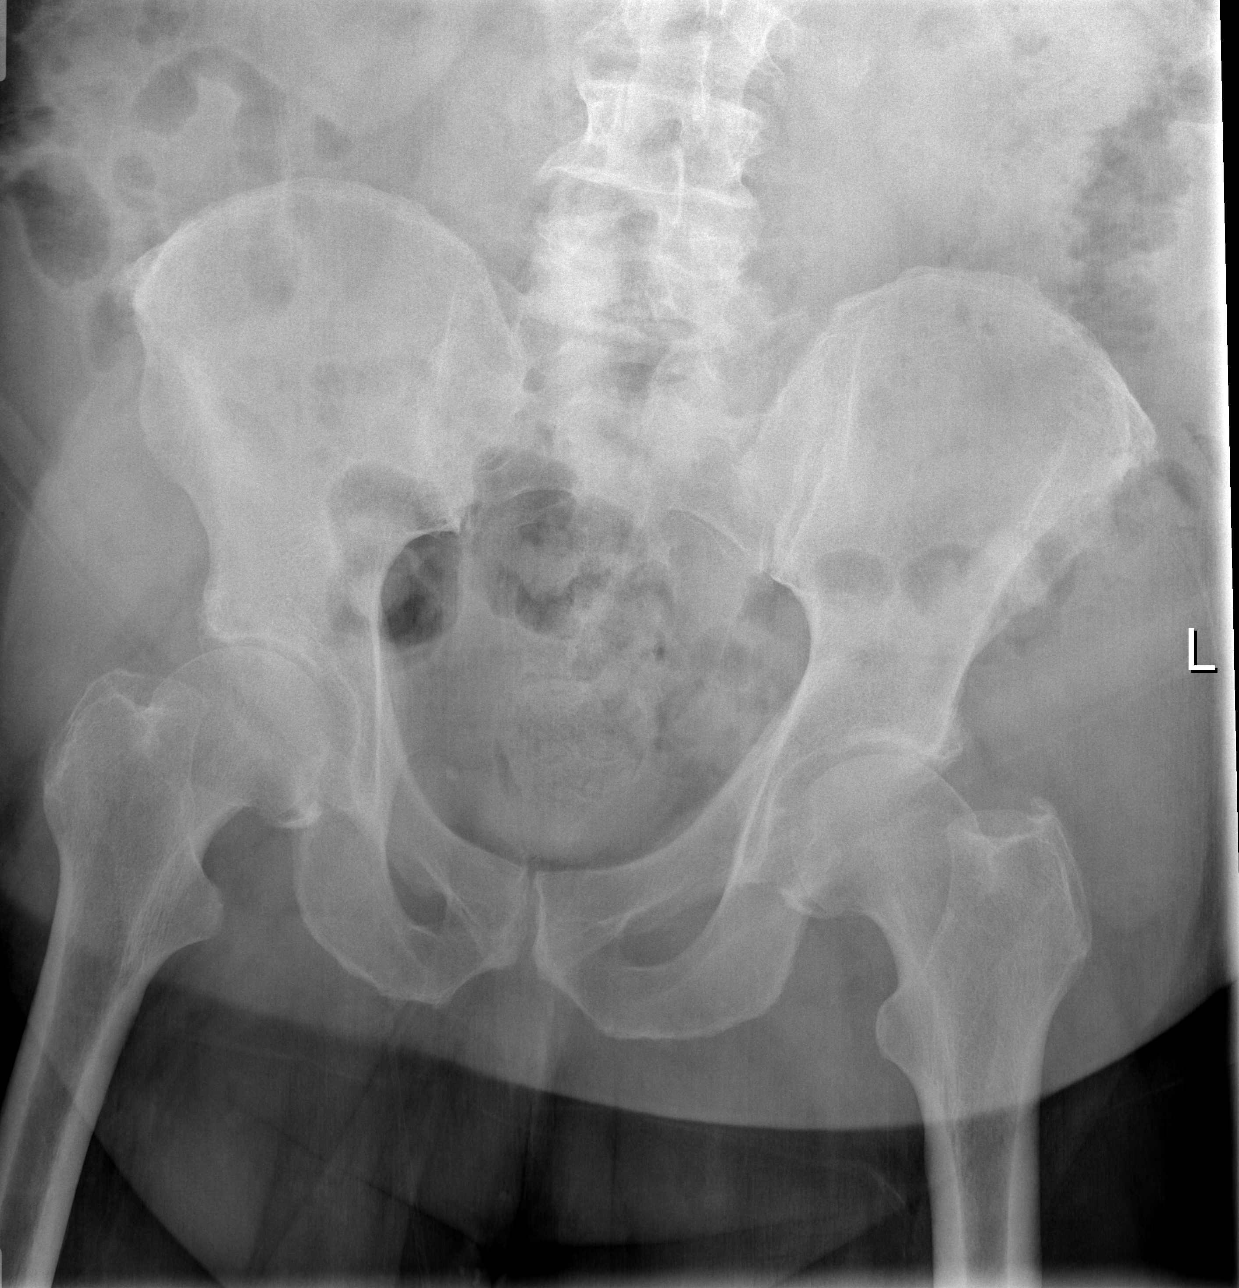

[t hip ap left]
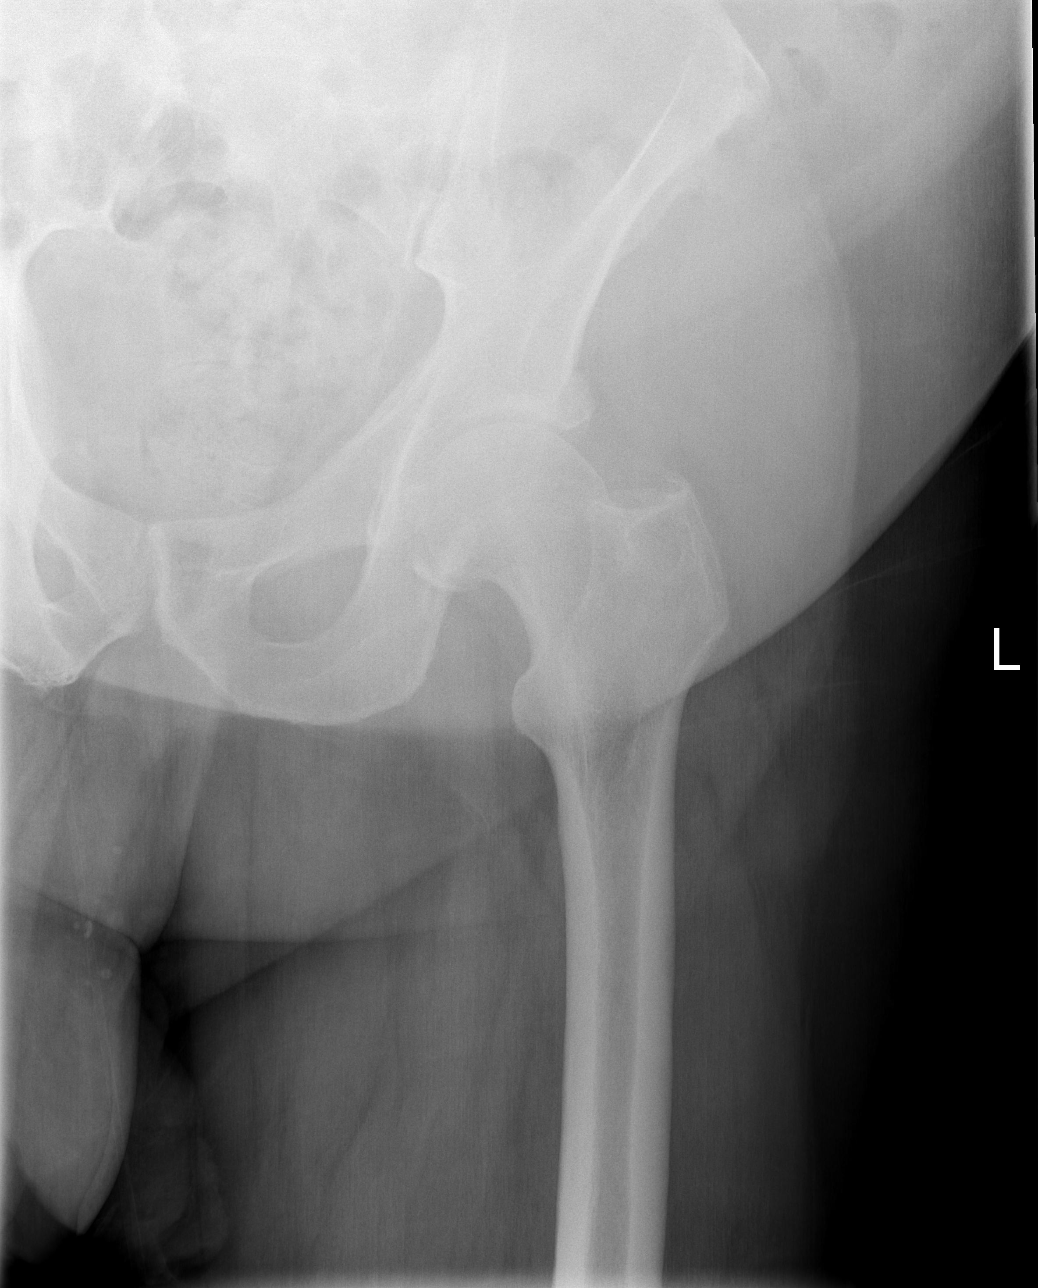

[t hip frog leg left]
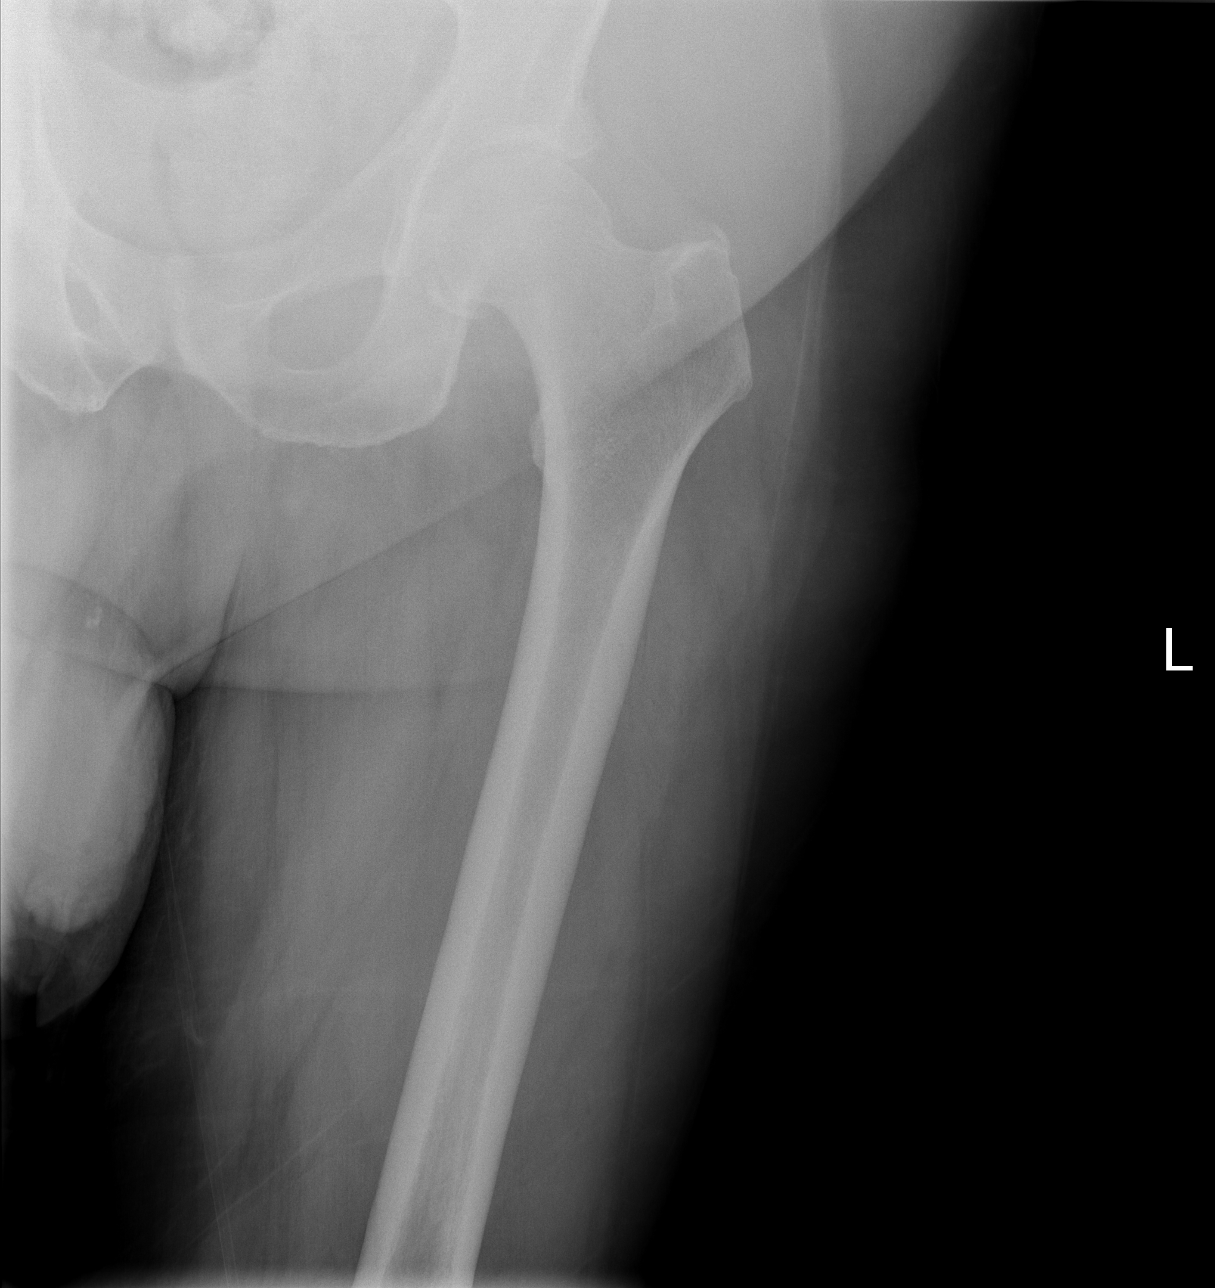

[3 of 3 positions shown; findings below may reference images not displayed]

FINDINGS: There is no evidence of hip fracture or dislocation. Moderate
degenerative osteoarthrosis noted.

The visualized pelvis is intact. Degenerative changes noted within
the lower lumbar spine. Degenerative changes also noted about the
right hip.

No soft tissue abnormality.
IMPRESSION: No acute fracture or dislocation.

## 2014-03-20 IMAGING — CR DG TIBIA/FIBULA 2V*L*
4 series · 4 of 4 positions shown · non-contrast
Comparison: Left knee radiographs performed [DATE]

CLINICAL DATA: Left leg injury.

EXAM:
LEFT TIBIA AND FIBULA - 2 VIEW

[t tib/fib lat left (1 of 3)]
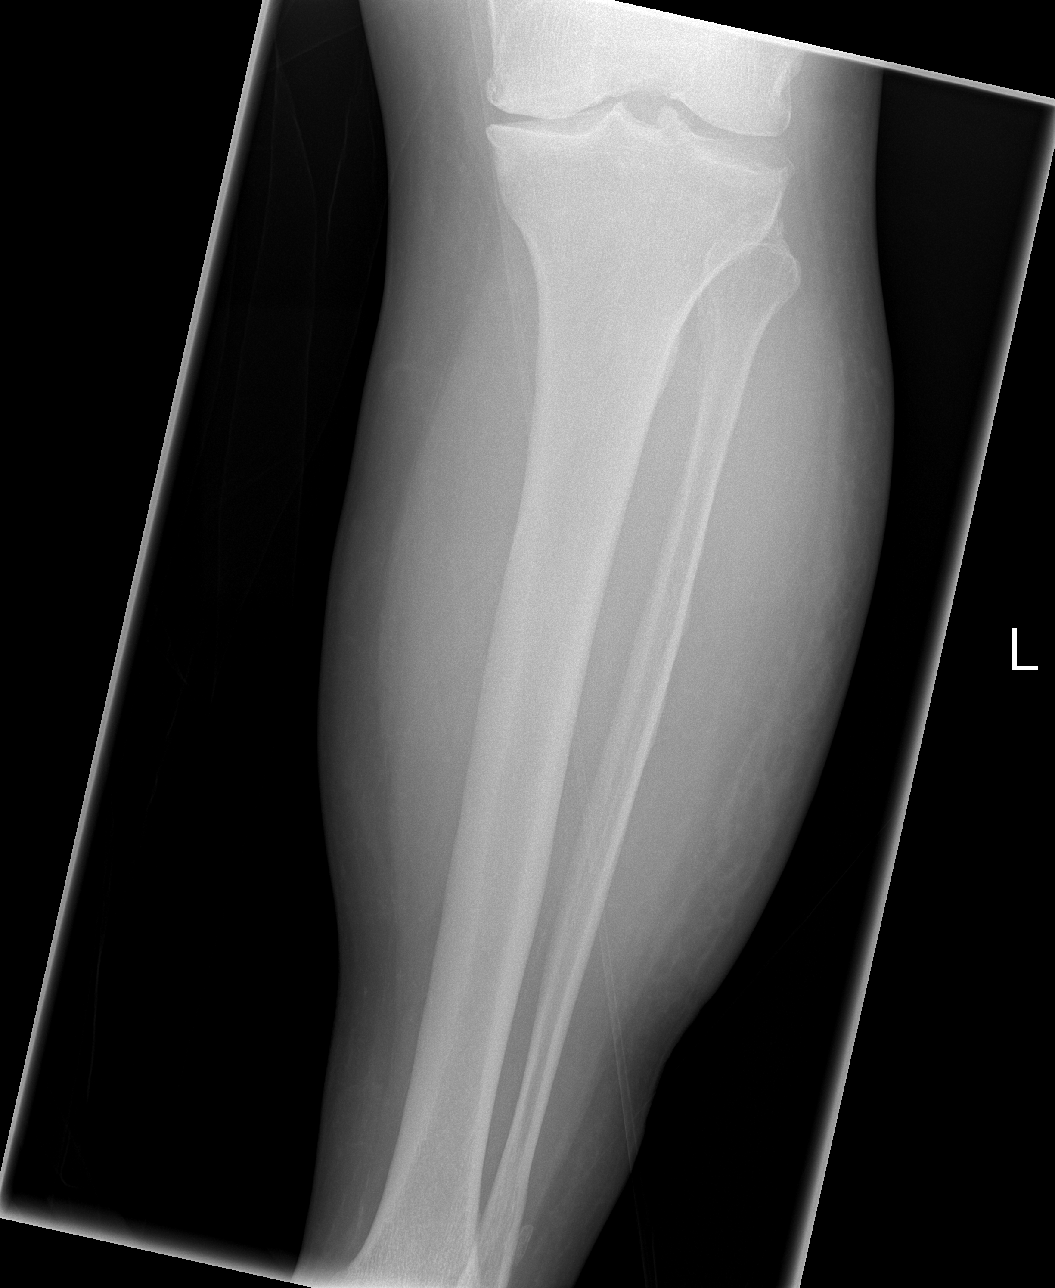

[t tib/fib ap left]
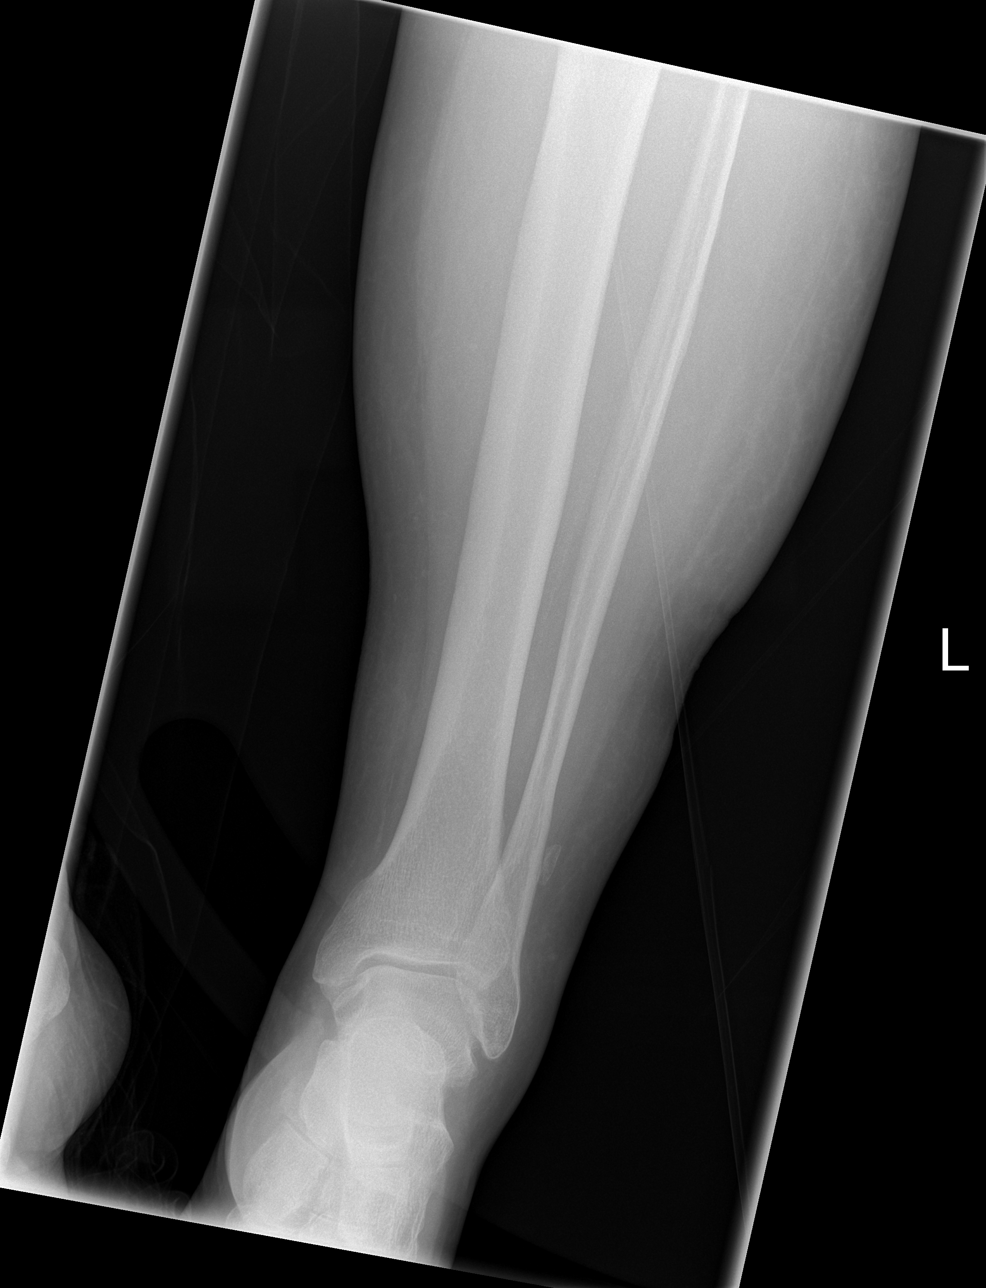

[t tib/fib lat left (2 of 3)]
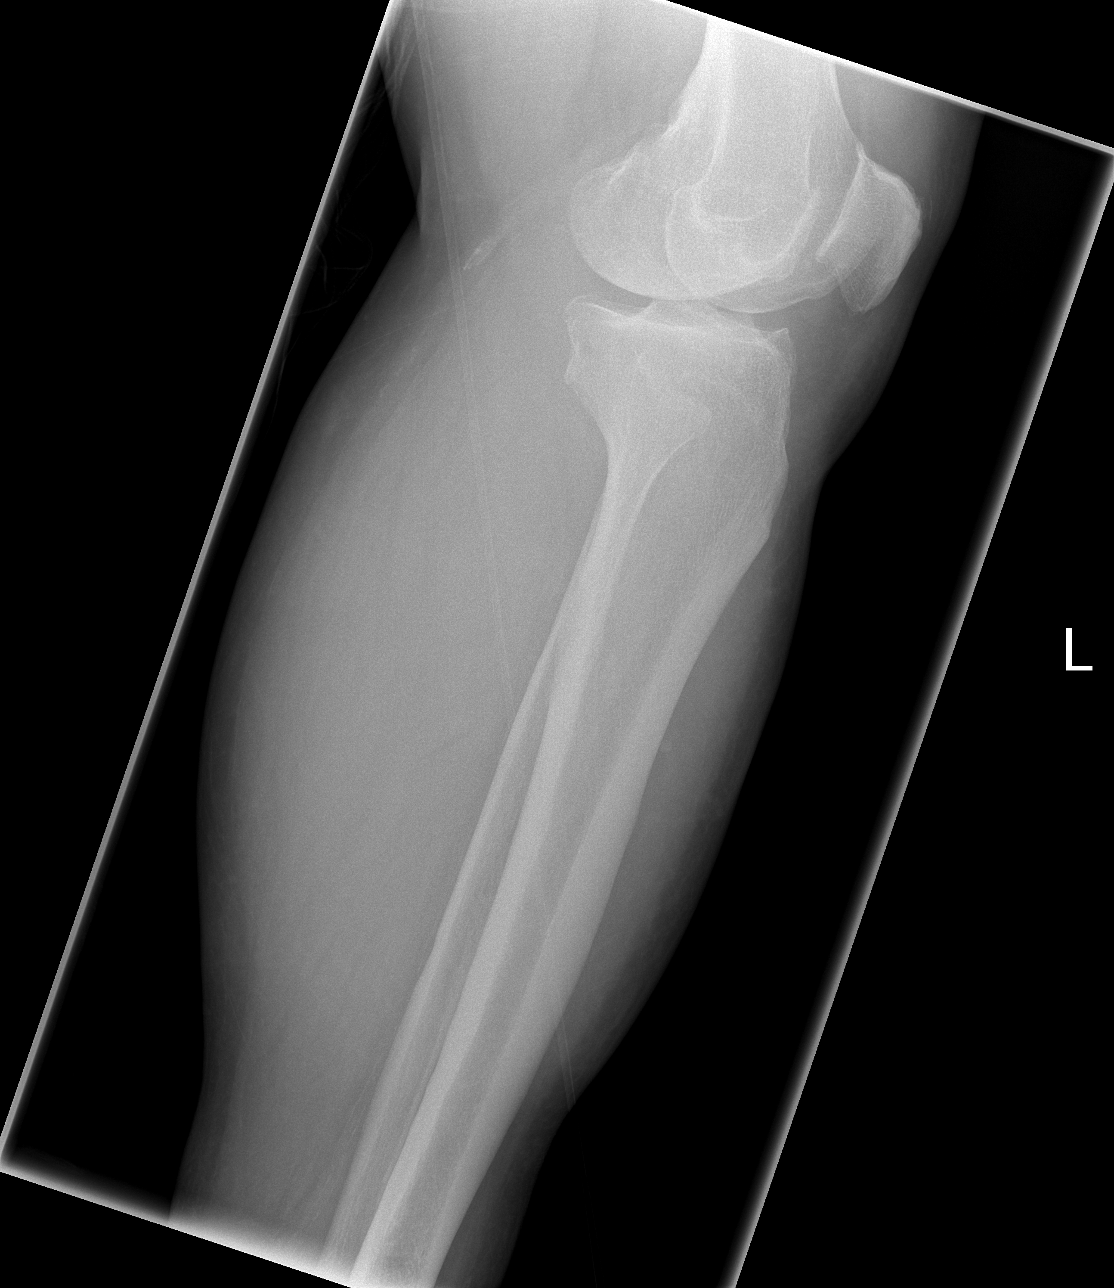

[t tib/fib lat left (3 of 3)]
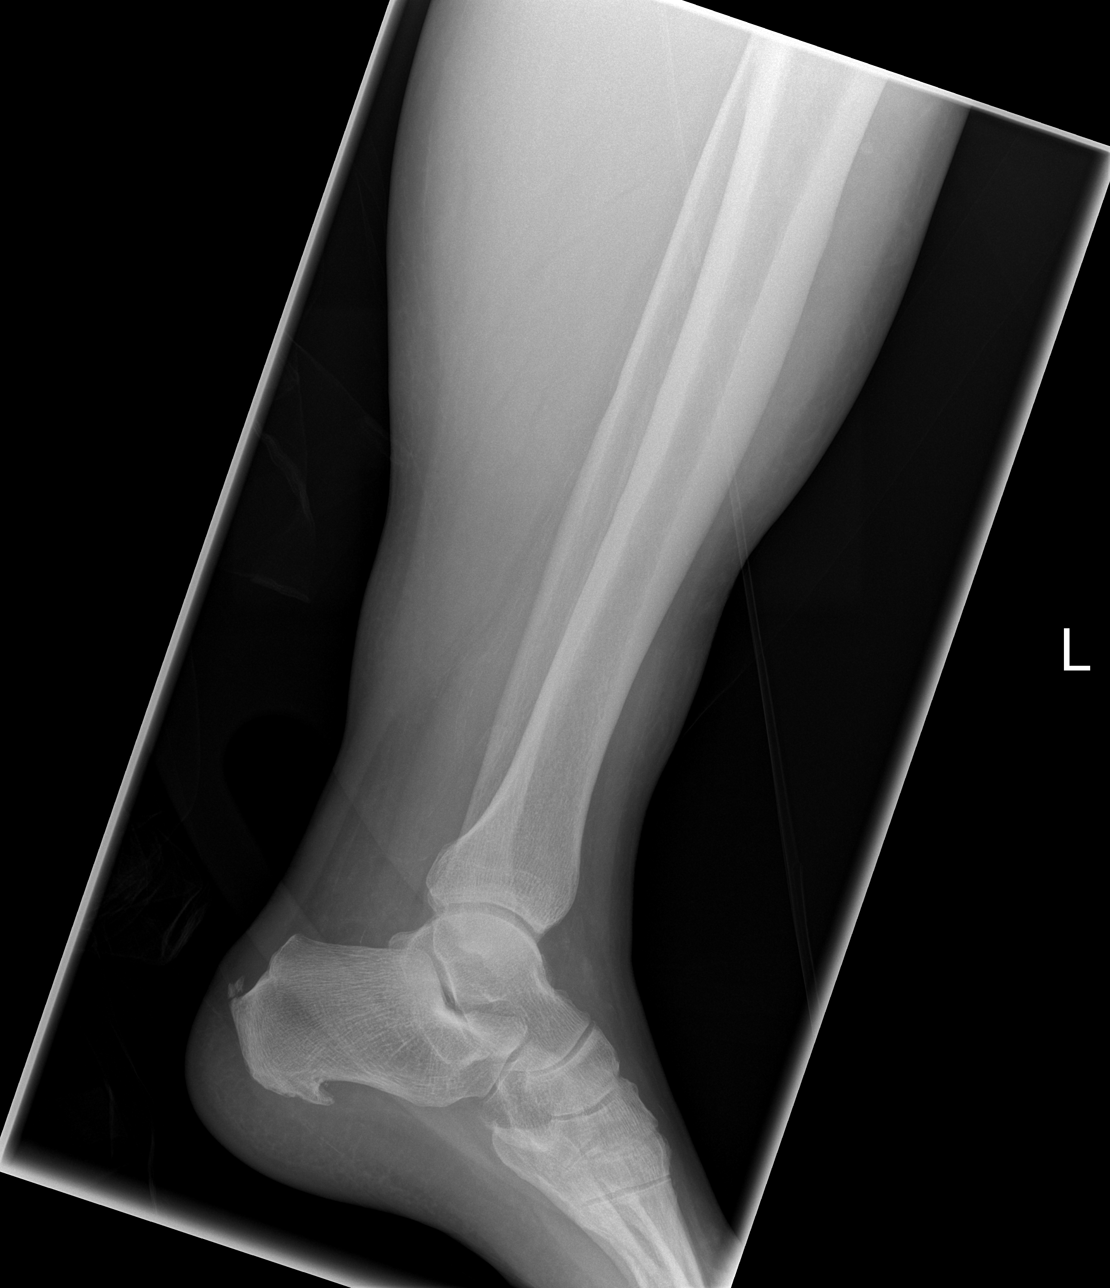

[4 of 4 positions shown; findings below may reference images not displayed]

FINDINGS: The left tibia and fibula are unremarkable in appearance. There is
no evidence of fracture or dislocation. The ankle mortise is
incompletely assessed, but appears grossly unremarkable. Marginal
osteophytes are noted at the knee. No knee joint effusion is
identified. Plantar and posterior calcaneal spurs are again seen.

No significant soft tissue abnormalities are characterized on
radiograph.
IMPRESSION: No evidence of fracture or dislocation.

## 2014-03-20 MED ORDER — OXYCODONE-ACETAMINOPHEN 5-325 MG PO TABS
2.0000 | ORAL_TABLET | Freq: Once | ORAL | Status: AC
  Administered 2014-03-20: 2 via ORAL
  Filled 2014-03-20: qty 2

## 2014-03-20 MED ORDER — KETOROLAC TROMETHAMINE 60 MG/2ML IM SOLN
60.0000 mg | Freq: Once | INTRAMUSCULAR | Status: AC
  Administered 2014-03-20: 60 mg via INTRAMUSCULAR
  Filled 2014-03-20: qty 2

## 2014-03-20 MED ORDER — HYDROCODONE-ACETAMINOPHEN 5-325 MG PO TABS: 1.0000 | ORAL_TABLET | Freq: Four times a day (QID) | ORAL | Status: DC | PRN

## 2014-03-20 MED ORDER — MELOXICAM 7.5 MG PO TABS: 7.5000 mg | ORAL_TABLET | Freq: Every day | ORAL | Status: DC

## 2014-03-20 NOTE — ED Provider Notes (Signed)
CSN: [id number]     Arrival date & time 03/20/14  0008 History   First MD Initiated Contact with Patient 03/20/14 0251     Chief Complaint  Patient presents with  . Knee Injury     (Consider location/radiation/quality/duration/timing/severity/associated sxs/prior Treatment) Patient is a 62 y.o. male presenting with knee pain. The history is provided by the patient.  Knee Pain Location:  Knee Injury: yes   Mechanism of injury: fall   Fall:    Fall occurred:  Standing   Impact surface:  Product manager of impact: scratched left tib fib.   Entrapped after fall: no   Knee location:  L knee Pain details:    Quality:  Aching   Radiates to:  Does not radiate   Severity:  Severe   Onset quality:  Sudden   Timing:  Constant   Progression:  Unchanged Chronicity:  New Dislocation: no   Foreign body present:  No foreign bodies Relieved by:  Nothing Worsened by:  Nothing tried Ineffective treatments:  None tried Associated symptoms: no back pain, no neck pain, no numbness, no stiffness and no tingling   Risk factors: no concern for non-accidental trauma     Past Medical History  Diagnosis Date  . Coronary artery disease   . Hypertension   . Hyperlipidemia   . Hemorrhoids   . Diverticulosis     mild  . Hx of colonic polyps   . Left ventricular dysfunction     hx of  . Acute MI    Past Surgical History  Procedure Laterality Date  . Angioplasty      and bare metal stent placement  . Coronary angioplasty with stent placement     Family History  Problem Relation Age of Onset  . Coronary artery disease      positive family hx of   History  Substance Use Topics  . Smoking status: Current Every Day Smoker    Types: Cigars  . Smokeless tobacco: Not on file  . Alcohol Use: No    Review of Systems  Musculoskeletal: Negative for back pain, neck pain and stiffness.  Neurological: Negative for weakness and numbness.  All other systems reviewed and are  negative.     Allergies  Penicillins  Home Medications   Prior to Admission medications   Medication Sig Start Date End Date Taking? Authorizing Provider  aspirin 81 MG tablet Take 81 mg by mouth daily.      Historical Provider, MD  clonazePAM (KLONOPIN) 1 MG tablet Take 1 mg by mouth 2 (two) times daily as needed.     Historical Provider, MD  furosemide (LASIX) 40 MG tablet Take 40 mg by mouth 2 (two) times daily.      Historical Provider, MD  HYDROcodone-acetaminophen (NORCO) 5-325 MG per tablet Take 1 tablet by mouth every 6 (six) hours as needed. 03/20/14   Reonna Finlayson Alfonso Patten, MD  HYDROcodone-acetaminophen (NORCO/VICODIN) 5-325 MG per tablet Take 1 tablet by mouth every 6 (six) hours as needed for moderate pain or severe pain. 03/11/14   Billy Fischer, MD  loratadine (CLARITIN) 10 MG tablet Take 10 mg by mouth daily.    Historical Provider, MD  meloxicam (MOBIC) 7.5 MG tablet Take 1 tablet (7.5 mg total) by mouth 2 (two) times daily as needed. 03/24/12   Adlih Moreno-Coll, MD  meloxicam (MOBIC) 7.5 MG tablet Take 1 tablet (7.5 mg total) by mouth daily. 03/20/14   Ridhi Hoffert Alfonso Patten, MD  metFORMIN (GLUCOPHAGE)  500 MG tablet Take 500 mg by mouth at bedtime.     Historical Provider, MD  metoprolol (LOPRESSOR) 50 MG tablet Take 1 tablet (50 mg total) by mouth 2 (two) times daily. 12/01/13   Burtis Junes, NP  nitroGLYCERIN (NITROSTAT) 0.4 MG SL tablet Place 1 tablet (0.4 mg total) under the tongue every 5 (five) minutes as needed. 04/06/13   Burnell Blanks, MD  oxyCODONE-acetaminophen (PERCOCET) 5-325 MG per tablet 1 to 2 tablets every 6 hours as needed for pain. 10/14/13   Harden Mo, MD  pantoprazole (PROTONIX) 40 MG tablet Take 1 tablet (40 mg total) by mouth daily. 01/06/14   Gregor Hams, MD  ramipril (ALTACE) 2.5 MG tablet Take 2.5 mg by mouth daily.      Historical Provider, MD  simvastatin (ZOCOR) 40 MG tablet Take 1 tablet (40 mg total) by mouth at bedtime. 02/23/13    Burnell Blanks, MD  sitaGLIPtin (JANUVIA) 100 MG tablet Take 100 mg by mouth daily.    Historical Provider, MD  traMADol (ULTRAM) 50 MG tablet Take 1 tablet by mouth every 8 (eight) hours as needed. 01/01/14   Historical Provider, MD   BP 102/86  Pulse 98  Temp(Src) 98 F (36.7 C) (Oral)  Resp 18  SpO2 96% Physical Exam  Constitutional: He is oriented to person, place, and time. He appears well-developed and well-nourished. No distress.  HENT:  Head: Normocephalic and atraumatic.  Mouth/Throat: Oropharynx is clear and moist.  Eyes: Pupils are equal, round, and reactive to light.  Neck: Normal range of motion. Neck supple.  Cardiovascular: Normal rate, regular rhythm and normal heart sounds.   Pulmonary/Chest: Effort normal and breath sounds normal. He has no wheezes. He has no rales.  Abdominal: Soft. Bowel sounds are normal. There is no tenderness. There is no rebound and no guarding.  Pelvis stable  Musculoskeletal: Normal range of motion. He exhibits no edema.  Contusion lateral tib fib.  No cords compartments soft FROM of the LLE, cap refill to the toes of the left foot < 2 sec intact achilles tendon.  Negativa anterior and posterior drawer test of the left knee.  No laxity to varus or valgus stress no patella alta or baja.  Equal temperatur of BLE and 2+ dorsalis pedis of BLE  Neurological: He is alert and oriented to person, place, and time. He has normal reflexes.  Skin: Skin is warm and dry.  Psychiatric: He has a normal mood and affect.    ED Course  Procedures (including critical care time) Labs Review Labs Reviewed - No data to display  Imaging Review Dg Hip Complete Left  03/20/2014   CLINICAL DATA:  Left leg injury  EXAM: LEFT HIP - COMPLETE 2+ VIEW  COMPARISON:  None.  FINDINGS: There is no evidence of hip fracture or dislocation. Moderate degenerative osteoarthrosis noted.  The visualized pelvis is intact. Degenerative changes noted within the lower lumbar  spine. Degenerative changes also noted about the right hip.  No soft tissue abnormality.  IMPRESSION: No acute fracture or dislocation.   Electronically Signed   By: Jeannine Boga M.D.   On: 03/20/2014 03:42   Dg Tibia/fibula Left  03/20/2014   CLINICAL DATA:  Left leg injury.  EXAM: LEFT TIBIA AND FIBULA - 2 VIEW  COMPARISON:  Left knee radiographs performed 09/02/2010  FINDINGS: The left tibia and fibula are unremarkable in appearance. There is no evidence of fracture or dislocation. The ankle mortise is incompletely assessed,  but appears grossly unremarkable. Marginal osteophytes are noted at the knee. No knee joint effusion is identified. Plantar and posterior calcaneal spurs are again seen.  No significant soft tissue abnormalities are characterized on radiograph.  IMPRESSION: No evidence of fracture or dislocation.   Electronically Signed   By: Garald Balding M.D.   On: 03/20/2014 03:51   Dg Knee Complete 4 Views Left  03/20/2014   CLINICAL DATA:  KNEE INJURY  EXAM: LEFT KNEE - COMPLETE 4+ VIEW  COMPARISON:  Prior radiograph from 09/02/2010.  FINDINGS: No acute fracture or dislocation. No joint effusion. Moderate tricompartmental osteoarthrosis is present.  No soft tissue abnormality. Osseous mineralization within normal limits.  IMPRESSION: 1. No acute fracture or dislocation. 2. Moderate tricompartmental osteoarthrosis.   Electronically Signed   By: Jeannine Boga M.D.   On: 03/20/2014 01:48     EKG Interpretation None      MDM   Final diagnoses:  Arthritis of knee  Contusion    Is able to move the leg and is complaining of swelling under the contusion of the lateral tib fib.  There are no signs of compartment syndrome.  The patient is able to move the leg in all directions.  There is no foreshortening or rotation.  Will give pain medication crutches ice and elevation and follow up with orthopedics.  Return for coldness numbness or any concerns.      Carlisle Beers, MD 03/20/14 [phone]

## 2014-03-20 NOTE — ED Notes (Signed)
Pt c/o left knee injury while at work x 6 hrs ago

## 2014-03-20 NOTE — Discharge Instructions (Signed)

## 2014-07-08 ENCOUNTER — Other Ambulatory Visit: Payer: Self-pay | Admitting: Nurse Practitioner

## 2014-10-26 ENCOUNTER — Emergency Department (HOSPITAL_COMMUNITY)
Admission: EM | Admit: 2014-10-26 | Discharge: 2014-10-26 | Disposition: A | Discharge status: Home / Self Care | Payer: 59 | Attending: Emergency Medicine | Admitting: Emergency Medicine

## 2014-10-26 ENCOUNTER — Encounter (HOSPITAL_COMMUNITY): Payer: Self-pay | Admitting: Physical Medicine and Rehabilitation

## 2014-10-26 ENCOUNTER — Emergency Department (HOSPITAL_COMMUNITY): Payer: 59

## 2014-10-26 DIAGNOSIS — Z9861 Coronary angioplasty status: Secondary | ICD-10-CM | POA: Diagnosis not present

## 2014-10-26 DIAGNOSIS — E119 Type 2 diabetes mellitus without complications: Secondary | ICD-10-CM

## 2014-10-26 DIAGNOSIS — R0602 Shortness of breath: Secondary | ICD-10-CM | POA: Insufficient documentation

## 2014-10-26 DIAGNOSIS — Z8719 Personal history of other diseases of the digestive system: Secondary | ICD-10-CM | POA: Diagnosis not present

## 2014-10-26 DIAGNOSIS — Z86010 Personal history of colonic polyps: Secondary | ICD-10-CM | POA: Insufficient documentation

## 2014-10-26 DIAGNOSIS — Z7982 Long term (current) use of aspirin: Secondary | ICD-10-CM | POA: Diagnosis not present

## 2014-10-26 DIAGNOSIS — Z720 Tobacco use: Secondary | ICD-10-CM | POA: Diagnosis not present

## 2014-10-26 DIAGNOSIS — R079 Chest pain, unspecified: Secondary | ICD-10-CM | POA: Diagnosis present

## 2014-10-26 DIAGNOSIS — I519 Heart disease, unspecified: Secondary | ICD-10-CM | POA: Insufficient documentation

## 2014-10-26 DIAGNOSIS — E785 Hyperlipidemia, unspecified: Secondary | ICD-10-CM | POA: Insufficient documentation

## 2014-10-26 DIAGNOSIS — I10 Essential (primary) hypertension: Secondary | ICD-10-CM | POA: Insufficient documentation

## 2014-10-26 DIAGNOSIS — I252 Old myocardial infarction: Secondary | ICD-10-CM | POA: Diagnosis not present

## 2014-10-26 DIAGNOSIS — E669 Obesity, unspecified: Secondary | ICD-10-CM

## 2014-10-26 DIAGNOSIS — I2510 Atherosclerotic heart disease of native coronary artery without angina pectoris: Secondary | ICD-10-CM | POA: Diagnosis not present

## 2014-10-26 DIAGNOSIS — R0789 Other chest pain: Secondary | ICD-10-CM

## 2014-10-26 DIAGNOSIS — Z791 Long term (current) use of non-steroidal anti-inflammatories (NSAID): Secondary | ICD-10-CM | POA: Diagnosis not present

## 2014-10-26 DIAGNOSIS — Z880 Allergy status to penicillin: Secondary | ICD-10-CM | POA: Diagnosis not present

## 2014-10-26 LAB — CBC WITH DIFFERENTIAL/PLATELET
Basophils Absolute: 0 10*3/uL (ref 0.0–0.1)
Basophils Relative: 0 % (ref 0–1)
Eosinophils Absolute: 0.2 10*3/uL (ref 0.0–0.7)
Eosinophils Relative: 2 % (ref 0–5)
HCT: 42.3 % (ref 39.0–52.0)
Hemoglobin: 14.3 g/dL (ref 13.0–17.0)
Lymphocytes Relative: 33 % (ref 12–46)
Lymphs Abs: 3 10*3/uL (ref 0.7–4.0)
MCH: 30.1 pg (ref 26.0–34.0)
MCHC: 33.8 g/dL (ref 30.0–36.0)
MCV: 89.1 fL (ref 78.0–100.0)
Monocytes Absolute: 0.7 10*3/uL (ref 0.1–1.0)
Monocytes Relative: 7 % (ref 3–12)
Neutro Abs: 5.4 10*3/uL (ref 1.7–7.7)
Neutrophils Relative %: 58 % (ref 43–77)
Platelets: 212 10*3/uL (ref 150–400)
RBC: 4.75 MIL/uL (ref 4.22–5.81)
RDW: 13.7 % (ref 11.5–15.5)
WBC: 9.3 10*3/uL (ref 4.0–10.5)

## 2014-10-26 LAB — COMPREHENSIVE METABOLIC PANEL WITH GFR
ALT: 36 U/L (ref 0–53)
AST: 53 U/L — ABNORMAL HIGH (ref 0–37)
Albumin: 4.1 g/dL (ref 3.5–5.2)
Alkaline Phosphatase: 75 U/L (ref 39–117)
Anion gap: 6 (ref 5–15)
BUN: 10 mg/dL (ref 6–23)
CO2: 28 mmol/L (ref 19–32)
Calcium: 9.6 mg/dL (ref 8.4–10.5)
Chloride: 102 meq/L (ref 96–112)
Creatinine, Ser: 1.21 mg/dL (ref 0.50–1.35)
GFR calc Af Amer: 72 mL/min — ABNORMAL LOW
GFR calc non Af Amer: 62 mL/min — ABNORMAL LOW
Glucose, Bld: 176 mg/dL — ABNORMAL HIGH (ref 70–99)
Potassium: 4.3 mmol/L (ref 3.5–5.1)
Sodium: 136 mmol/L (ref 135–145)
Total Bilirubin: 0.6 mg/dL (ref 0.3–1.2)
Total Protein: 7.1 g/dL (ref 6.0–8.3)

## 2014-10-26 LAB — I-STAT TROPONIN, ED
Troponin i, poc: 0 ng/mL (ref 0.00–0.08)
Troponin i, poc: 0 ng/mL (ref 0.00–0.08)

## 2014-10-26 IMAGING — CR DG CHEST 2V
2 series · 2 of 2 positions shown · non-contrast
Comparison: [DATE]

CLINICAL DATA: 63-year-old with chest pain.

EXAM:
CHEST  2 VIEW

[w chest pa]
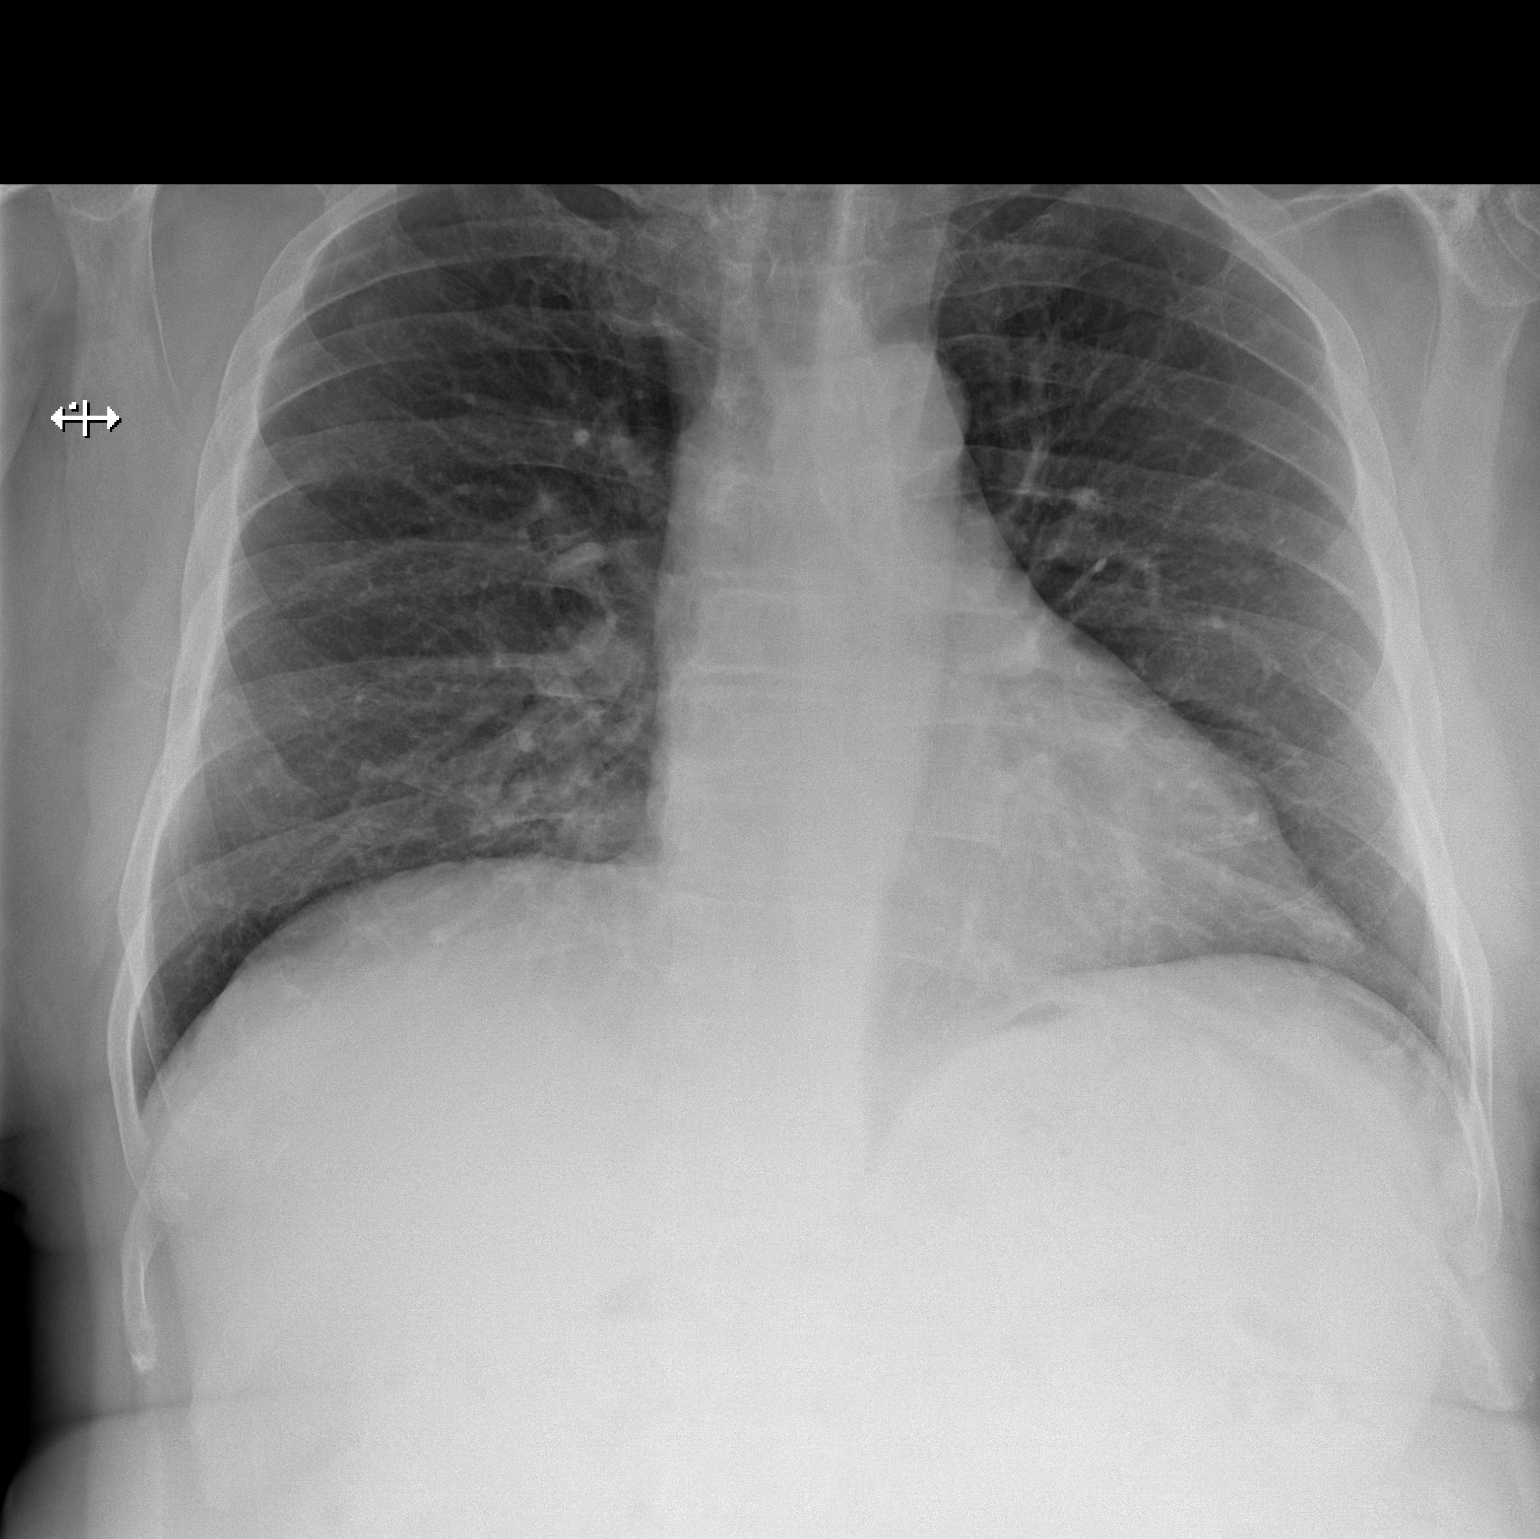

[w chest lat]
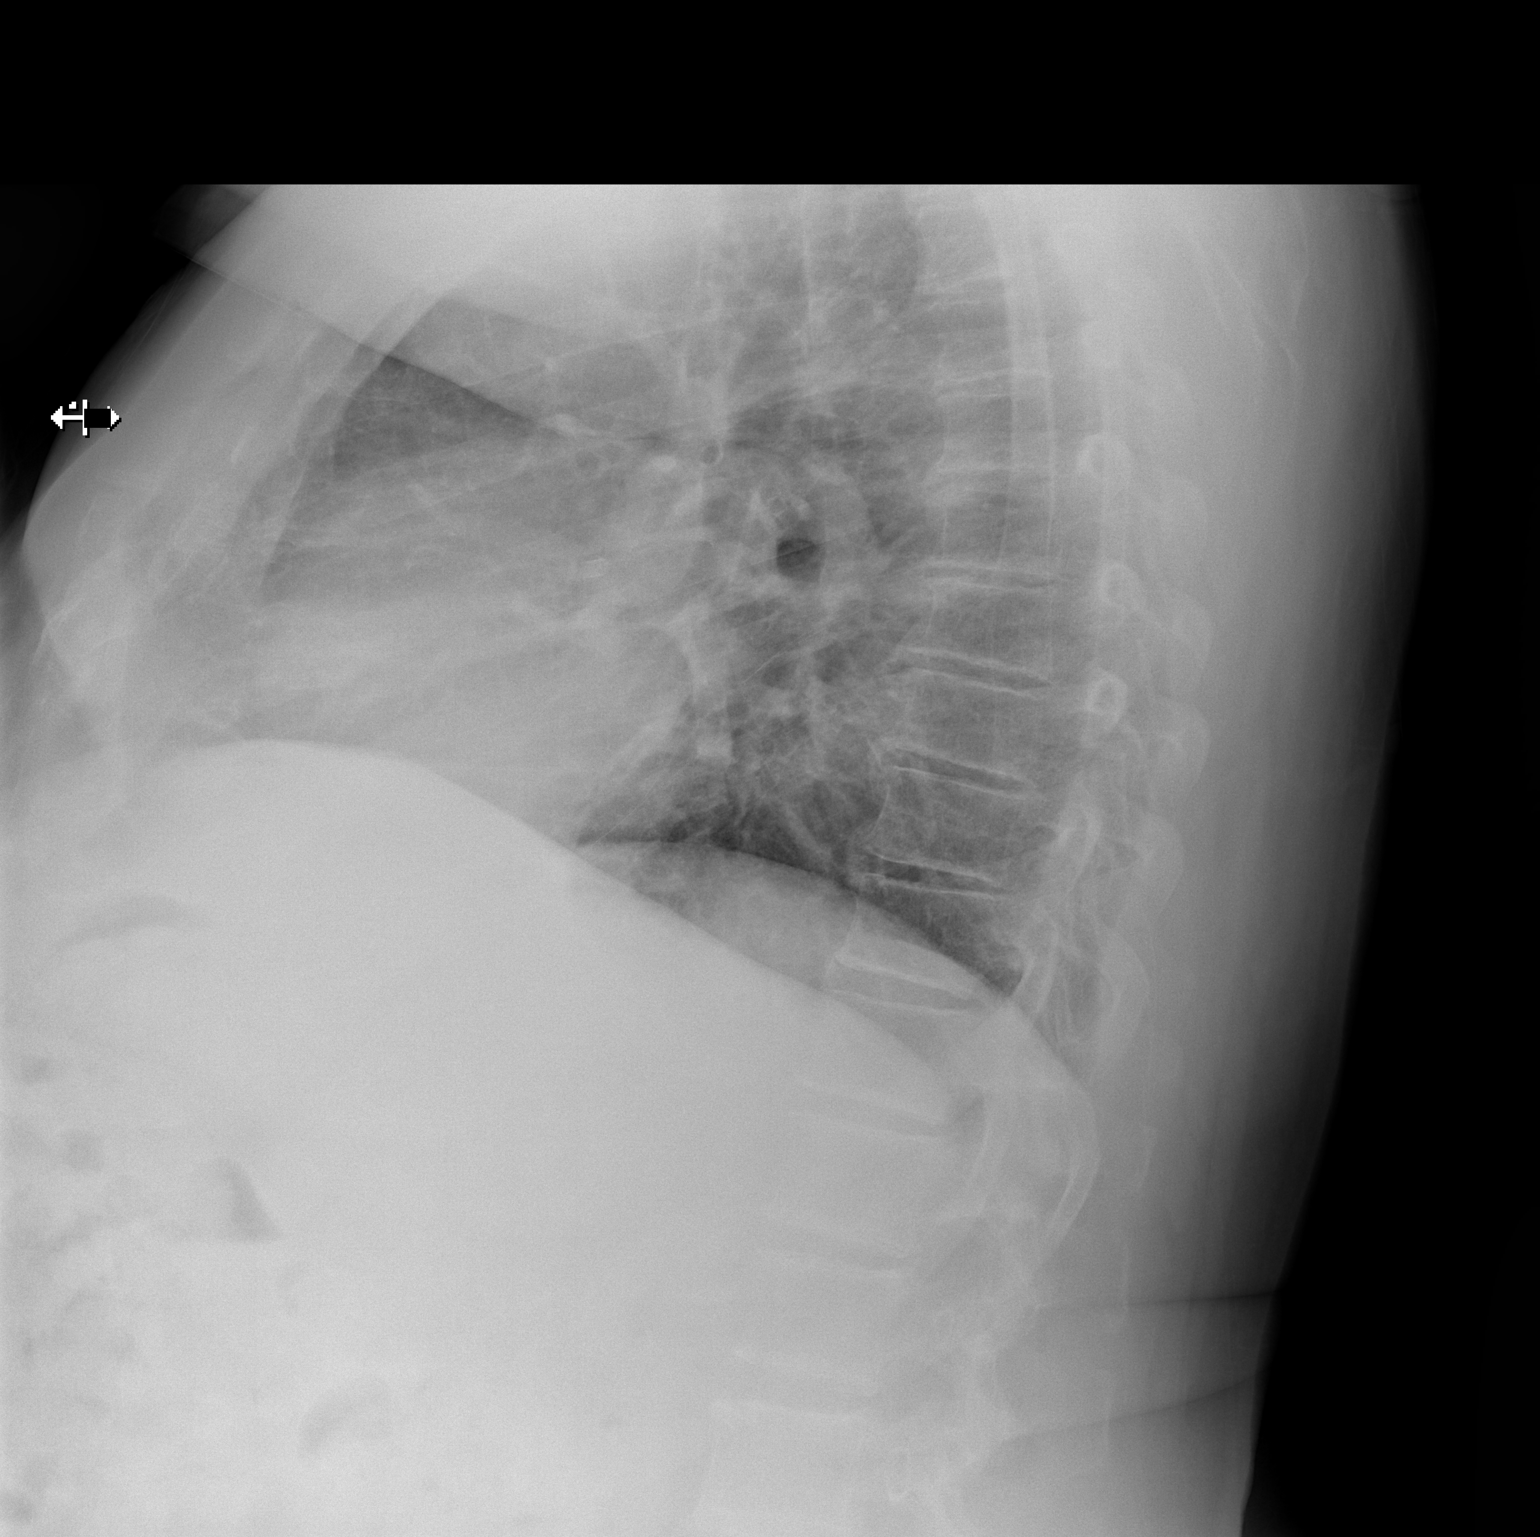

[2 of 2 positions shown; findings below may reference images not displayed]

FINDINGS: Lungs are clear bilaterally. Heart and mediastinum are within normal
limits. The trachea is midline. Negative for pleural effusions.
IMPRESSION: No active cardiopulmonary disease.

## 2014-10-26 MED ORDER — ISOSORBIDE MONONITRATE ER 30 MG PO TB24: 30.0000 mg | ORAL_TABLET | Freq: Every day | ORAL | Status: DC

## 2014-10-26 NOTE — Discharge Instructions (Signed)

## 2014-10-26 NOTE — ED Notes (Signed)
Pt presents to department for evaluation of diffuse chest pain and SOB, onset today after getting into argument with girlfriend. 6/10 chest pain upon arrival. Pt is alert and oriented x4. Respirations unlabored.

## 2014-10-26 NOTE — Consult Note (Signed)
Admit date: 10/26/2014 Referring Physician  Dr. Darl Householder Primary Physician Marton Redwood, MD Primary Cardiologist  [facility] Reason for Consultation  chest pain  HPI: 64 year old male with coronary artery disease, diabetes hypertension, hyperlipidemia seen last in clinic on 01/18/14 with last catheterization on 06/23/10, Dr. Lia Foyer, 30-40% proximal LAD restenosis, 30% mid LAD, 40% mid circumflex, no RCA disease with left ventricular ejection fraction of 45-50% who presented to the emergency room today after having an argument with his wife and developing substernal chest discomfort with no radiation which lasted a proximally 30 minutes and subsided on its own after his stress was relieved. His pitbull woke him up from the couch and startled him which started the whole experience. He felt short of breath. He was actually chest pain-free by the time he came to the emergency room. Troponin and EKG reassuring.   Back in April 2015 he ended up having a pharmacologic stress test after he described occasional chest pain with exertion. 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  His nuclear stress test on 02/02/14 was overall intermediate risk secondary to moderate-sized apical defect/infarct pattern and ejection fraction of 43% however there was no evidence of significant ischemia identified. Continued with medical management.  Currently pain-free. Hard of hearing. His wife is a smoker. He does not smoke. Does not drink.  He was asking for Percocet, for his headache. He said Tylenol would not work.   PMH:   Past Medical History  Diagnosis Date  . Coronary artery disease   . Hypertension   . Hyperlipidemia   . Hemorrhoids   . Diverticulosis     mild  . Hx of colonic polyps   . Left ventricular dysfunction     hx of  . Acute MI     PSH:   Past Surgical History  Procedure Laterality Date  . Angioplasty      and bare metal stent placement  . Coronary angioplasty with stent  placement     Allergies:  Penicillins Prior to Admit Meds:   Prior to Admission medications   Medication Sig Start Date End Date Taking? Authorizing Provider  aspirin 81 MG tablet Take 81 mg by mouth daily.     Yes Historical Provider, MD  clonazePAM (KLONOPIN) 1 MG tablet Take 1 mg by mouth 2 (two) times daily as needed for anxiety.    Yes Historical Provider, MD  furosemide (LASIX) 40 MG tablet Take 40 mg by mouth 2 (two) times daily.     Yes Historical Provider, MD  meloxicam (MOBIC) 7.5 MG tablet Take 1 tablet (7.5 mg total) by mouth 2 (two) times daily as needed. Patient taking differently: Take 7.5 mg by mouth 2 (two) times daily as needed for pain.  03/24/12  Yes Adlih Moreno-Coll, MD  metFORMIN (GLUCOPHAGE) 500 MG tablet Take 500 mg by mouth 2 (two) times daily.    Yes Historical Provider, MD  metoprolol (LOPRESSOR) 50 MG tablet TAKE 1 TABLET TWICE A DAY 07/09/14  Yes Burtis Junes, NP  nitroGLYCERIN (NITROSTAT) 0.4 MG SL tablet Place 1 tablet (0.4 mg total) under the tongue every 5 (five) minutes as needed. Patient taking differently: Place 0.4 mg under the tongue every 5 (five) minutes as needed for chest pain.  04/06/13  Yes Burnell Blanks, MD  ramipril (ALTACE) 2.5 MG tablet Take 2.5 mg by mouth daily.     Yes Historical Provider, MD  simvastatin (ZOCOR) 40 MG tablet Take 1 tablet (40 mg total)  by mouth at bedtime. 02/23/13  Yes Burnell Blanks, MD  sitaGLIPtin (JANUVIA) 100 MG tablet Take 100 mg by mouth daily.   Yes Historical Provider, MD  traMADol (ULTRAM) 50 MG tablet Take 1 tablet by mouth every 8 (eight) hours as needed for moderate pain.  01/01/14  Yes Historical Provider, MD  HYDROcodone-acetaminophen (NORCO) 5-325 MG per tablet Take 1 tablet by mouth every 6 (six) hours as needed. Patient not taking: Reported on 10/26/2014 03/20/14   April K Palumbo-Rasch, MD  HYDROcodone-acetaminophen (NORCO/VICODIN) 5-325 MG per tablet Take 1 tablet by mouth every 6 (six) hours  as needed for moderate pain or severe pain. Patient not taking: Reported on 10/26/2014 03/11/14   Billy Fischer, MD  meloxicam (MOBIC) 7.5 MG tablet Take 1 tablet (7.5 mg total) by mouth daily. Patient not taking: Reported on 10/26/2014 03/20/14   April K Palumbo-Rasch, MD  oxyCODONE-acetaminophen (PERCOCET) 5-325 MG per tablet 1 to 2 tablets every 6 hours as needed for pain. Patient not taking: Reported on 10/26/2014 10/14/13   Harden Mo, MD  pantoprazole (PROTONIX) 40 MG tablet Take 1 tablet (40 mg total) by mouth daily. Patient not taking: Reported on 10/26/2014 01/06/14   Gregor Hams, MD   Fam HX:    Family History  Problem Relation Age of Onset  . Coronary artery disease      positive family hx of   Social HX:    History   Social History  . Marital Status: Married    Spouse Name: N/A    Number of Children: N/A  . Years of Education: N/A   Occupational History  . full time [employer] account executive   Social History Main Topics  . Smoking status: Current Every Day Smoker    Types: Cigars  . Smokeless tobacco: Not on file  . Alcohol Use: No  . Drug Use: No  . Sexual Activity: Not on file   Other Topics Concern  . Not on file   Social History Narrative     ROS:  Denies any syncope, bleeding, orthopnea, PND, rash. Positive headache-possibly after nitroglycerin All 11 ROS were addressed and are negative except what is stated in the HPI   Physical Exam: Blood pressure 108/60, pulse 80, temperature 97.4 F (36.3 C), temperature source Oral, resp. rate 19, height 5\' 8"  (1.727 m), SpO2 94 %.   General: Well developed, well nourished, in no acute distress Head: Eyes PERRLA, No xanthomas.  Long beard, Normal cephalic and atramatic  Lungs:   Clear bilaterally to auscultation and percussion. Normal respiratory effort. No wheezes, no rales. Heart:   HRRR S1 S2 Pulses are 2+ & equal. No murmur, rubs, gallops.  No carotid bruit. No JVD.  No abdominal bruits.  Abdomen: Bowel sounds are  positive, abdomen soft and non-tender without masses. No hepatosplenomegaly. Obese Msk:  Back normal. Normal strength and tone for age. Extremities:  No clubbing, cyanosis or edema.  DP +1 to plus radial pulse Neuro: Alert and oriented X 3, non-focal, MAE x 4 GU: Deferred Rectal: Deferred Psych:  Good affect, responds appropriately      Labs: Lab Results  Component Value Date   WBC 9.3 10/26/2014   HGB 14.3 10/26/2014   HCT 42.3 10/26/2014   MCV 89.1 10/26/2014   PLT 212 10/26/2014     Recent Labs Lab 10/26/14 1614  NA 136  K 4.3  CL 102  CO2 28  BUN 10  CREATININE 1.21  CALCIUM 9.6  PROT  7.1  BILITOT 0.6  ALKPHOS 75  ALT 36  AST 53*  GLUCOSE 176*   No results for input(s): CKTOTAL, CKMB, TROPONINI in the last 72 hours. No results found for: CHOL, HDL, LDLCALC, TRIG Lab Results  Component Value Date   DDIMER 0.45 07/23/2011     Radiology:  Dg Chest 2 View  10/26/2014   CLINICAL DATA:  64 year old with chest pain.  EXAM: CHEST  2 VIEW  COMPARISON:  01/06/2014  FINDINGS: Lungs are clear bilaterally. Heart and mediastinum are within normal limits. The trachea is midline. Negative for pleural effusions.  IMPRESSION: No active cardiopulmonary disease.   Electronically Signed   By: Markus Daft M.D.   On: 10/26/2014 17:10   Personally viewed.  EKG:  Sinus rhythm, heart rate 93 with nonspecific ST segment changes/flattening, no significant change from prior EKG on 01/18/14. Personally viewed.   ASSESSMENT/PLAN:    64 year old male with known coronary artery disease as described above with diabetes, hypertension, hyperlipidemia, obesity who had chest discomfort in the setting of stress/argument, currently chest pain-free, troponin normal, EKG unchanged.  -If second troponin is normal, I'm comfortable with discharge from emergency department given his recent nuclear stress test showing no evidence of ischemia.  -Please add Imdur 30 mg, long-acting nitrate, to his beta  blocker regimen to help alleviate angina.  -Encouraged him to replenish his nitroglycerin supply secondary to age.  -Encouraged weight loss  -Encouraged close monitoring of diabetes.  If symptoms worsen or become more worrisome despite antianginals, next step would be to proceed with cardiac catheterization via Dr. Angelena Form.  We will set up for close follow-up in 1-2 weeks.  Candee Furbish, MD  10/26/2014  6:55 PM

## 2014-10-26 NOTE — ED Provider Notes (Signed)
CSN: [id number]     Arrival date & time 10/26/14  1556 History   First MD Initiated Contact with Patient 10/26/14 1614     Chief Complaint  Patient presents with  . Chest Pain  . Shortness of Breath     (Consider location/radiation/quality/duration/timing/severity/associated sxs/prior Treatment) Patient is a 64 y.o. male presenting with chest pain and shortness of breath. The history is provided by the patient. No language interpreter was used.  Chest Pain Pain location:  Substernal area Pain quality: dull   Pain radiates to:  Does not radiate Pain radiates to the back: no   Pain severity:  Moderate Onset quality:  Gradual Duration:  1 hour Timing:  Constant Progression:  Resolved Chronicity:  New Context: stress   Context: not breathing, not eating, not lifting, no movement and not at rest   Relieved by:  Nothing Worsened by:  Nothing tried Ineffective treatments:  None tried Associated symptoms: shortness of breath   Associated symptoms: no abdominal pain, no anxiety, no back pain, no cough, no fever, no nausea, no numbness, not vomiting and no weakness   Shortness of breath:    Severity:  Moderate   Onset quality:  Gradual   Duration:  1 hour   Timing:  Constant   Progression:  Resolved Risk factors: coronary artery disease, high cholesterol, hypertension, male sex and obesity   Risk factors: no aortic disease and no birth control   Shortness of Breath Associated symptoms: chest pain   Associated symptoms: no abdominal pain, no cough, no fever and no vomiting     Past Medical History  Diagnosis Date  . Coronary artery disease   . Hypertension   . Hyperlipidemia   . Hemorrhoids   . Diverticulosis     mild  . Hx of colonic polyps   . Left ventricular dysfunction     hx of  . Acute MI    Past Surgical History  Procedure Laterality Date  . Angioplasty      and bare metal stent placement  . Coronary angioplasty with stent placement     Family History   Problem Relation Age of Onset  . Coronary artery disease      positive family hx of   History  Substance Use Topics  . Smoking status: Current Every Day Smoker    Types: Cigars  . Smokeless tobacco: Not on file  . Alcohol Use: No    Review of Systems  Constitutional: Negative for fever and chills.  Respiratory: Positive for shortness of breath. Negative for cough.   Cardiovascular: Positive for chest pain.  Gastrointestinal: Negative for nausea, vomiting and abdominal pain.  Musculoskeletal: Negative for back pain.  Neurological: Negative for weakness and numbness.  All other systems reviewed and are negative.     Allergies  Penicillins  Home Medications   Prior to Admission medications   Medication Sig Start Date End Date Taking? Authorizing Provider  aspirin 81 MG tablet Take 81 mg by mouth daily.      Historical Provider, MD  clonazePAM (KLONOPIN) 1 MG tablet Take 1 mg by mouth 2 (two) times daily as needed.     Historical Provider, MD  furosemide (LASIX) 40 MG tablet Take 40 mg by mouth 2 (two) times daily.      Historical Provider, MD  HYDROcodone-acetaminophen (NORCO) 5-325 MG per tablet Take 1 tablet by mouth every 6 (six) hours as needed. 03/20/14   April Alfonso Patten, MD  HYDROcodone-acetaminophen (NORCO/VICODIN) 5-325 MG per tablet  Take 1 tablet by mouth every 6 (six) hours as needed for moderate pain or severe pain. 03/11/14   Billy Fischer, MD  loratadine (CLARITIN) 10 MG tablet Take 10 mg by mouth daily.    Historical Provider, MD  meloxicam (MOBIC) 7.5 MG tablet Take 1 tablet (7.5 mg total) by mouth 2 (two) times daily as needed. 03/24/12   Adlih Moreno-Coll, MD  meloxicam (MOBIC) 7.5 MG tablet Take 1 tablet (7.5 mg total) by mouth daily. 03/20/14   April K Palumbo-Rasch, MD  metFORMIN (GLUCOPHAGE) 500 MG tablet Take 500 mg by mouth at bedtime.     Historical Provider, MD  metoprolol (LOPRESSOR) 50 MG tablet TAKE 1 TABLET TWICE A DAY 07/09/14   Burtis Junes, NP   nitroGLYCERIN (NITROSTAT) 0.4 MG SL tablet Place 1 tablet (0.4 mg total) under the tongue every 5 (five) minutes as needed. 04/06/13   Burnell Blanks, MD  oxyCODONE-acetaminophen (PERCOCET) 5-325 MG per tablet 1 to 2 tablets every 6 hours as needed for pain. 10/14/13   Harden Mo, MD  pantoprazole (PROTONIX) 40 MG tablet Take 1 tablet (40 mg total) by mouth daily. 01/06/14   Gregor Hams, MD  ramipril (ALTACE) 2.5 MG tablet Take 2.5 mg by mouth daily.      Historical Provider, MD  simvastatin (ZOCOR) 40 MG tablet Take 1 tablet (40 mg total) by mouth at bedtime. 02/23/13   Burnell Blanks, MD  sitaGLIPtin (JANUVIA) 100 MG tablet Take 100 mg by mouth daily.    Historical Provider, MD  traMADol (ULTRAM) 50 MG tablet Take 1 tablet by mouth every 8 (eight) hours as needed. 01/01/14   Historical Provider, MD   BP 112/75 mmHg  Pulse 88  Temp(Src) 97.4 F (36.3 C) (Oral)  Resp 20  Ht 5\' 8"  (1.727 m)  SpO2 97% Physical Exam  Constitutional: He is oriented to person, place, and time. He appears well-developed and well-nourished. No distress.  HENT:  Head: Normocephalic and atraumatic.  Eyes: Pupils are equal, round, and reactive to light.  Neck: Normal range of motion.  Cardiovascular: Normal rate, regular rhythm and normal heart sounds.   Pulmonary/Chest: Effort normal and breath sounds normal. No respiratory distress. He has no decreased breath sounds. He has no wheezes. He has no rhonchi. He has no rales.  Abdominal: Soft. He exhibits no distension. There is no tenderness. There is no rebound and no guarding.  Musculoskeletal: He exhibits no edema or tenderness.  Neurological: He is alert and oriented to person, place, and time. He exhibits normal muscle tone.  Skin: Skin is warm and dry.  Nursing note and vitals reviewed.   ED Course  Procedures (including critical care time) Labs Review Labs Reviewed  CBC WITH DIFFERENTIAL  COMPREHENSIVE METABOLIC PANEL  I-STAT Secretary,  ED    Imaging Review No results found.   EKG Interpretation   Date/Time:  Tuesday October 26 2014 15:58:55 EST Ventricular Rate:  93 PR Interval:  180 QRS Duration: 102 QT Interval:  362 QTC Calculation: 450 R Axis:   17 Text Interpretation:  Normal sinus rhythm Low voltage QRS Nonspecific T  wave abnormality Abnormal ECG No significant change since last tracing  Confirmed by YAO  MD, DAVID ([id number]) on 10/26/2014 4:26:30 PM      MDM   Final diagnoses:  Chest pain   The patient is a 64 year old Caucasian male with a pertinent past medical history of coronary artery disease with a stent placed 8 years ago  who comes to the emergency department today with chest pain after an argument with his wife. Physical exam as above. With no shortness of breath, no tachypnea, no hypoxia, no history of DVT or PE I doubt a PE. Review of the records demonstrates patient had a stress test approximately a year ago which demonstrated regional wall motion abnormalities which are chronic in nature. He had a cardiac catheterization 4 years ago which demonstrated 40% stenosis of his previous stent. Initial workup included a CBC, CMP, troponin, chest x-ray, and an EKG. EKG is detailed above. Chest x-ray demonstrated no consolidations. With no shortness of breath, cough, or fever I doubt pneumonia. CMP was unremarkable. CBC was unremarkable. Initial troponin was negative. With patient's significant cardiac history cardiology was consulted to evaluate the patient in the emergency department. With the patient's recent nuclear medicine study cardiology did not feel the patient required admission for provocative testing at this time. They recommended a repeat troponin and if that is negative discharge with follow-up with them as an outpatient after starting Imdur. Repeat troponin was negative as result I feel the patient is stable for discharge at this time. He was instructed to follow up with cardiology as previously  scheduled and to return to the emergency department with worsening chest pain, shortness of breath, weakness, numbness, or any other concerns. The patient expressed understanding. He was discharged in good condition.  Labs and imaging were reviewed by myself and considered in medical decision making. Imaging was interpreted by radiology. Care was discussed with my attending Dr. Darl Householder.      Katheren Shams, MD 10/27/14 [id number]  Wandra Arthurs, MD 10/27/14 [phone]

## 2014-10-26 NOTE — ED Notes (Signed)
Dr. Mariam Dollar at bedside

## 2014-10-26 NOTE — ED Notes (Signed)
Lab at the bedside 

## 2014-10-26 NOTE — ED Notes (Signed)
Cardiology at the bedside.

## 2014-11-29 ENCOUNTER — Encounter: Payer: Self-pay | Admitting: Physician Assistant

## 2014-11-29 NOTE — Progress Notes (Deleted)
Cardiology Office Note   Date:  11/29/2014   ID:  Vincent [name], Vincent [name] June 30, 1951, MRN [id number]  PCP:  Marton Redwood, MD  Cardiologist:  Dr. Lauree Chandler     No chief complaint on file.    History of Present Illness: Vincent [name] is a 64 y.o. male with a hx of CAD status post anterior MI in 2007 treated with a stent to the LAD, HTN, HL, DM2, obesity. Last seen by Dr. Angelena Form 01/2014. Patient complained of chest pain and stress testing was performed. Myoview demonstrated prior infarct but no ischemia, EF 43%. Medical therapy was continued.  Patient was seen by Dr. Marlou Porch in the emergency room 10/26/14 with chest pain after large amount with his wife. EKG and CXR was unremarkable.  CEs remained normal.  No further workup was recommended.  Imdur was added and he was asked to FU as an outpatient.  ***   Studies/Reports Reviewed Today:  Myoview 02/02/14  Intermediate risk Moderate sized apical defect, infarct pattern.  No ischemia. EF 43%.   Cardiac catheterization 06/2010 LAD: Stent patent with 30-40% ISR, Diag mid 30-40% LCx: Mid 40% RCA: Ok EF: Apical HK, 45-50%   Past Medical History  Diagnosis Date  . Coronary artery disease   . Hypertension   . Hyperlipidemia   . Hemorrhoids   . Diverticulosis     mild  . Hx of colonic polyps   . Left ventricular dysfunction     hx of  . Acute MI     Past Surgical History  Procedure Laterality Date  . Angioplasty      and bare metal stent placement  . Coronary angioplasty with stent placement       Current Outpatient Prescriptions  Medication Sig Dispense Refill  . aspirin 81 MG tablet Take 81 mg by mouth daily.      . clonazePAM (KLONOPIN) 1 MG tablet Take 1 mg by mouth 2 (two) times daily as needed for anxiety.     . furosemide (LASIX) 40 MG tablet Take 40 mg by mouth 2 (two) times daily.      Marland Kitchen HYDROcodone-acetaminophen (NORCO) 5-325 MG per tablet Take 1 tablet by mouth every 6 (six) hours as needed.  (Patient not taking: Reported on 10/26/2014) 10 tablet 0  . HYDROcodone-acetaminophen (NORCO/VICODIN) 5-325 MG per tablet Take 1 tablet by mouth every 6 (six) hours as needed for moderate pain or severe pain. (Patient not taking: Reported on 10/26/2014) 20 tablet 0  . isosorbide mononitrate (IMDUR) 30 MG 24 hr tablet Take 1 tablet (30 mg total) by mouth daily. 30 tablet 1  . meloxicam (MOBIC) 7.5 MG tablet Take 1 tablet (7.5 mg total) by mouth 2 (two) times daily as needed. (Patient taking differently: Take 7.5 mg by mouth 2 (two) times daily as needed for pain. ) 60 tablet 0  . meloxicam (MOBIC) 7.5 MG tablet Take 1 tablet (7.5 mg total) by mouth daily. (Patient not taking: Reported on 10/26/2014) 7 tablet 0  . metFORMIN (GLUCOPHAGE) 500 MG tablet Take 500 mg by mouth 2 (two) times daily.     . metoprolol (LOPRESSOR) 50 MG tablet TAKE 1 TABLET TWICE A DAY 60 tablet 5  . nitroGLYCERIN (NITROSTAT) 0.4 MG SL tablet Place 1 tablet (0.4 mg total) under the tongue every 5 (five) minutes as needed. (Patient taking differently: Place 0.4 mg under the tongue every 5 (five) minutes as needed for chest pain. ) 25 tablet 6  . oxyCODONE-acetaminophen (PERCOCET) 5-325 MG  per tablet 1 to 2 tablets every 6 hours as needed for pain. (Patient not taking: Reported on 10/26/2014) 20 tablet 0  . pantoprazole (PROTONIX) 40 MG tablet Take 1 tablet (40 mg total) by mouth daily. (Patient not taking: Reported on 10/26/2014) 30 tablet 1  . ramipril (ALTACE) 2.5 MG tablet Take 2.5 mg by mouth daily.      . simvastatin (ZOCOR) 40 MG tablet Take 1 tablet (40 mg total) by mouth at bedtime. 90 tablet 3  . sitaGLIPtin (JANUVIA) 100 MG tablet Take 100 mg by mouth daily.    . traMADol (ULTRAM) 50 MG tablet Take 1 tablet by mouth every 8 (eight) hours as needed for moderate pain.      No current facility-administered medications for this visit.    Allergies:   Penicillins    Social History:  The patient  reports that he has been  smoking Cigars.  He does not have any smokeless tobacco history on file. He reports that he does not drink alcohol or use illicit drugs.   Family History:  The patient's ***family history includes Coronary artery disease in an other family member.    ROS:   Please see the history of present illness.   ROS    PHYSICAL EXAM: VS:  There were no vitals taken for this visit.    Wt Readings from Last 3 Encounters:  02/01/14 262 lb (118.842 kg)  01/18/14 261 lb (118.389 kg)  04/06/13 278 lb 12.8 oz (126.463 kg)     GEN: Well nourished, well developed, in no acute distress HEENT: normal Neck: *** JVD, ***carotid bruits, no masses Cardiac:  Normal S1/S2, ***RRR; *** murmur ***, *** no rubs or gallops, {NUMBERS; 1+ TO 4+, TRACE/RARE:14493} edema  Respiratory:  ***clear to auscultation bilaterally, no wheezing, rhonchi or rales. GI: ***soft, nontender, nondistended, + BS MS: no deformity or atrophy Skin: warm and dry  Neuro:  CNs II-XII intact, Strength and sensation are intact Psych: Normal affect   EKG:  EKG {ACTION; IS/IS RAQ:[id number]} ordered today.  It demonstrates:   ***   Recent Labs: 10/26/2014: ALT 36; BUN 10; Creatinine 1.21; Hemoglobin 14.3; Platelets 212; Potassium 4.3; Sodium 136    Lipid Panel No results found for: CHOL, TRIG, HDL, CHOLHDL, VLDL, LDLCALC, LDLDIRECT    ASSESSMENT AND PLAN:  1.  *** 2.  CAD:  *** 3.  HTN:  *** 4.  Hyperlipidemia:  *** 5.  Diabetes:  ***   Current medicines are reviewed at length with the patient today.  The patient {ACTIONS; HAS/DOES NOT HAVE:19233} concerns regarding medicines.  The following changes have been made:  {PLAN; NO CHANGE:13088:s}  Labs/ tests ordered today include: *** No orders of the defined types were placed in this encounter.     Disposition:   FU with *** in {gen number 5-45:625638} {TIME; UNITS DAY/WEEK/MONTH:19136}   Signed, Versie Starks, MHS 11/29/2014 7:54 AM    [facility]  [facility], [place],   [address] Phone: [phone]; Fax: [phone]

## 2014-11-30 NOTE — Progress Notes (Signed)
This encounter was created in error - please disregard.

## 2014-12-17 ENCOUNTER — Encounter: Payer: Self-pay | Admitting: Physician Assistant

## 2014-12-20 ENCOUNTER — Other Ambulatory Visit: Payer: Self-pay

## 2014-12-20 MED ORDER — ISOSORBIDE MONONITRATE ER 30 MG PO TB24: 30.0000 mg | ORAL_TABLET | Freq: Every day | ORAL | Status: DC

## 2014-12-21 ENCOUNTER — Other Ambulatory Visit: Payer: Self-pay | Admitting: Nurse Practitioner

## 2015-02-02 ENCOUNTER — Other Ambulatory Visit: Payer: Self-pay | Admitting: Cardiovascular Disease

## 2015-02-28 ENCOUNTER — Encounter: Payer: Self-pay | Admitting: Cardiovascular Disease

## 2015-02-28 ENCOUNTER — Ambulatory Visit: Payer: Self-pay

## 2015-02-28 NOTE — Progress Notes (Signed)
No show

## 2015-03-03 ENCOUNTER — Other Ambulatory Visit: Payer: Self-pay | Admitting: Cardiovascular Disease

## 2015-03-25 ENCOUNTER — Encounter: Payer: Self-pay | Admitting: Cardiovascular Disease

## 2015-08-24 ENCOUNTER — Other Ambulatory Visit: Payer: Self-pay | Admitting: Nurse Practitioner

## 2015-08-28 ENCOUNTER — Other Ambulatory Visit: Payer: Self-pay | Admitting: Cardiovascular Disease

## 2015-09-06 ENCOUNTER — Encounter: Payer: Self-pay | Admitting: Cardiovascular Disease

## 2015-09-06 ENCOUNTER — Ambulatory Visit (INDEPENDENT_AMBULATORY_CARE_PROVIDER_SITE_OTHER): Payer: 59 | Admitting: Cardiovascular Disease

## 2015-09-06 ENCOUNTER — Other Ambulatory Visit: Payer: Self-pay | Admitting: Cardiovascular Disease

## 2015-09-06 VITALS — BP 120/72 | HR 79 | Ht 68.0 in | Wt 252.1 lb

## 2015-09-06 DIAGNOSIS — I10 Essential (primary) hypertension: Secondary | ICD-10-CM

## 2015-09-06 MED ORDER — NITROGLYCERIN 0.4 MG SL SUBL: 0.4000 mg | SUBLINGUAL_TABLET | SUBLINGUAL | Status: DC | PRN

## 2015-09-06 MED ORDER — ISOSORBIDE MONONITRATE ER 30 MG PO TB24: 30.0000 mg | ORAL_TABLET | Freq: Every day | ORAL | Status: DC

## 2015-09-06 NOTE — Progress Notes (Signed)
Chief Complaint  Patient presents with  . Annual Exam     History of Present Illness: 64 yo [name] with history of CAD, HTN, HLD who is here today for cardiac follow up. He has been followed in the past by Dr. Lia Foyer. He had an anterior MI in 2007 with stent placed in LAD at that time. Last cath 06/23/10 per Dr. Lia Foyer with 30-40% proximal LAD restenosis, 30% mid LAD, 40% mid Circumflex, no RCA disease, LVEF=45-50%. Stress myoview April 2015 with no ischemia. He was seen in the ED January 2016 with chest pain while upset. Workup negative. Started on Imdur. No chest pain since.   He is here today for follow up. He has been doing well. No chest pain over last 9 months. No SOB. He has been let go from work.   Primary Care Physician:  Lang Snow  Last Lipid Profile: Followed in primary care  Past Medical History  Diagnosis Date  . Coronary artery disease   . Hypertension   . Hyperlipidemia   . Hemorrhoids   . Diverticulosis     mild  . Hx of colonic polyps   . Left ventricular dysfunction     hx of  . Acute MI [facility])     Past Surgical History  Procedure Laterality Date  . Angioplasty      and bare metal stent placement  . Coronary angioplasty with stent placement      Current Outpatient Prescriptions  Medication Sig Dispense Refill  . aspirin 81 MG tablet Take 81 mg by mouth daily.      . clonazePAM (KLONOPIN) 1 MG tablet Take 1 mg by mouth 2 (two) times daily as needed for anxiety.     . furosemide (LASIX) 40 MG tablet Take 40 mg by mouth 2 (two) times daily.      . isosorbide mononitrate (IMDUR) 30 MG 24 hr tablet Take 1 tablet (30 mg total) by mouth daily. 90 tablet 3  . meloxicam (MOBIC) 7.5 MG tablet Take 1 tablet (7.5 mg total) by mouth daily. 7 tablet 0  . metFORMIN (GLUCOPHAGE) 500 MG tablet Take 500 mg by mouth 2 (two) times daily.     . metoprolol (LOPRESSOR) 50 MG tablet TAKE 1 TABLET BY MOUTH TWICE DAILY 60 tablet 0  . nitroGLYCERIN (NITROSTAT) 0.4 MG SL tablet  Place 1 tablet (0.4 mg total) under the tongue every 5 (five) minutes as needed for chest pain. 25 tablet 3  . pantoprazole (PROTONIX) 40 MG tablet Take 1 tablet (40 mg total) by mouth daily. 30 tablet 1  . ramipril (ALTACE) 2.5 MG tablet Take 2.5 mg by mouth daily.      . simvastatin (ZOCOR) 40 MG tablet TAKE 1 TABLET AT BEDTIME 90 tablet 1  . sitaGLIPtin (JANUVIA) 100 MG tablet Take 100 mg by mouth daily.    . traMADol (ULTRAM) 50 MG tablet Take 1 tablet by mouth every 8 (eight) hours as needed for moderate pain.      No current facility-administered medications for this visit.    Allergies  Allergen Reactions  . Penicillins     unknown    Social History   Social History  . Marital Status: Married    Spouse Name: N/A  . Number of Children: N/A  . Years of Education: N/A   Occupational History  . full time [employer] account executive   Social History Main Topics  . Smoking status: Current Every Day Smoker    Types: Cigars  .  Smokeless tobacco: Not on file  . Alcohol Use: No  . Drug Use: No  . Sexual Activity: Not on file   Other Topics Concern  . Not on file   Social History Narrative    Family History  Problem Relation Age of Onset  . Coronary artery disease      positive family hx of    Review of Systems:  As stated in the HPI and otherwise negative.   BP 120/72 mmHg  Pulse 79  Ht 5\' 8"  (1.727 m)  Wt 252 lb 1.9 oz (114.361 kg)  BMI 38.34 kg/m2  Physical Examination: General: Well developed, well nourished, NAD HEENT: OP clear, mucus membranes moist SKIN: warm, dry. No rashes. Neuro: No focal deficits Musculoskeletal: Muscle strength 5/5 all ext Psychiatric: Mood and affect normal Neck: No JVD, no carotid bruits, no thyromegaly, no lymphadenopathy. Lungs:Clear bilaterally, no wheezes, rhonci, crackles Cardiovascular: Regular rate and rhythm. No murmurs, gallops or rubs. Abdomen:Soft. Bowel sounds present. Non-tender.  Extremities: No lower extremity edema.  Pulses are 2 + in the bilateral DP/PT.  EKG:  EKG is ordered today. The ekg ordered today demonstrates Sinus, rate 79 bpm. PVC. Non-specific T wave abnormality.   Recent Labs: 10/26/2014: ALT 36; BUN 10; Creatinine, Ser 1.21; Hemoglobin 14.3; Platelets 212; Potassium 4.3; Sodium 136   Lipid Panel No results found for: CHOL, TRIG, HDL, CHOLHDL, VLDL, LDLCALC, LDLDIRECT   Wt Readings from Last 3 Encounters:  09/06/15 252 lb 1.9 oz (114.361 kg)  02/01/14 262 lb (118.842 kg)  01/18/14 261 lb (118.389 kg)     Other studies Reviewed: Additional studies/ records that were reviewed today include: . Review of the above records demonstrates:    Assessment and Plan:   1. CAD: Stable.No recent chest pains. Continue ASA, statin, Ace-inh, beta blocker, Imdur. Stress test normal 2015.   2. HTN: BP controlled. No changes.   3. HLD: He is on a statin. Lipids followed in primary care and well controlled per pt.   Current medicines are reviewed at length with the patient today.  The patient does not have concerns regarding medicines.  The following changes have been made:  no change  Labs/ tests ordered today include:   Orders Placed This Encounter  Procedures  . EKG 12-Lead     Disposition:   FU with me in 12  months   Signed, Lauree Chandler, MD 09/06/2015 4:36 PM    [facility] [place], [place], [place]  [address] Phone: [phone]; Fax: [phone]

## 2015-09-06 NOTE — Patient Instructions (Signed)
Medication Instructions:  Your physician recommends that you continue on your current medications as directed. Please refer to the Current Medication list given to you today.  Labwork: None  Testing/Procedures: None  Follow-Up: Your physician wants you to follow-up in: 1 year with Dr. McAlhany.  You will receive a reminder letter in the mail two months in advance. If you don't receive a letter, please call our office to schedule the follow-up appointment.   Any Other Special Instructions Will Be Listed Below (If Applicable).     If you need a refill on your cardiac medications before your next appointment, please call your pharmacy.   

## 2015-09-07 NOTE — Telephone Encounter (Signed)
Per yesterdays office visit:3. HLD: He is on a statin. Lipids followed in primary care and well controlled per pt.  Should this be sent to pcp? Please advise. Thanks, MI

## 2015-09-07 NOTE — Telephone Encounter (Signed)
OK to refill

## 2015-10-04 ENCOUNTER — Other Ambulatory Visit: Payer: Self-pay | Admitting: Cardiovascular Disease

## 2016-01-01 ENCOUNTER — Emergency Department (HOSPITAL_COMMUNITY): Payer: BLUE CROSS/BLUE SHIELD

## 2016-01-01 ENCOUNTER — Emergency Department (HOSPITAL_COMMUNITY)
Admission: EM | Admit: 2016-01-01 | Discharge: 2016-01-01 | Disposition: A | Discharge status: Home / Self Care | Payer: BLUE CROSS/BLUE SHIELD | Attending: Emergency Medicine | Admitting: Emergency Medicine

## 2016-01-01 ENCOUNTER — Encounter (HOSPITAL_COMMUNITY): Payer: Self-pay | Admitting: Emergency Medicine

## 2016-01-01 DIAGNOSIS — R05 Cough: Secondary | ICD-10-CM

## 2016-01-01 DIAGNOSIS — Z880 Allergy status to penicillin: Secondary | ICD-10-CM | POA: Diagnosis not present

## 2016-01-01 DIAGNOSIS — I252 Old myocardial infarction: Secondary | ICD-10-CM | POA: Insufficient documentation

## 2016-01-01 DIAGNOSIS — F17210 Nicotine dependence, cigarettes, uncomplicated: Secondary | ICD-10-CM | POA: Insufficient documentation

## 2016-01-01 DIAGNOSIS — Z86010 Personal history of colonic polyps: Secondary | ICD-10-CM | POA: Insufficient documentation

## 2016-01-01 DIAGNOSIS — Z8719 Personal history of other diseases of the digestive system: Secondary | ICD-10-CM | POA: Insufficient documentation

## 2016-01-01 DIAGNOSIS — J069 Acute upper respiratory infection, unspecified: Secondary | ICD-10-CM | POA: Diagnosis not present

## 2016-01-01 DIAGNOSIS — Z7982 Long term (current) use of aspirin: Secondary | ICD-10-CM | POA: Insufficient documentation

## 2016-01-01 DIAGNOSIS — I10 Essential (primary) hypertension: Secondary | ICD-10-CM | POA: Insufficient documentation

## 2016-01-01 DIAGNOSIS — Z9861 Coronary angioplasty status: Secondary | ICD-10-CM | POA: Diagnosis not present

## 2016-01-01 DIAGNOSIS — E785 Hyperlipidemia, unspecified: Secondary | ICD-10-CM | POA: Diagnosis not present

## 2016-01-01 DIAGNOSIS — R079 Chest pain, unspecified: Secondary | ICD-10-CM | POA: Insufficient documentation

## 2016-01-01 DIAGNOSIS — I2510 Atherosclerotic heart disease of native coronary artery without angina pectoris: Secondary | ICD-10-CM | POA: Diagnosis not present

## 2016-01-01 DIAGNOSIS — Z79899 Other long term (current) drug therapy: Secondary | ICD-10-CM | POA: Diagnosis not present

## 2016-01-01 DIAGNOSIS — R059 Cough, unspecified: Secondary | ICD-10-CM

## 2016-01-01 LAB — I-STAT TROPONIN, ED
Troponin i, poc: 0 ng/mL (ref 0.00–0.08)
Troponin i, poc: 0 ng/mL (ref 0.00–0.08)

## 2016-01-01 LAB — CBC
HCT: 42.7 % (ref 39.0–52.0)
Hemoglobin: 13.8 g/dL (ref 13.0–17.0)
MCH: 30.1 pg (ref 26.0–34.0)
MCHC: 32.3 g/dL (ref 30.0–36.0)
MCV: 93 fL (ref 78.0–100.0)
Platelets: 205 10*3/uL (ref 150–400)
RBC: 4.59 MIL/uL (ref 4.22–5.81)
RDW: 13.4 % (ref 11.5–15.5)
WBC: 6.5 10*3/uL (ref 4.0–10.5)

## 2016-01-01 LAB — BASIC METABOLIC PANEL
Anion gap: 7 (ref 5–15)
BUN: 8 mg/dL (ref 6–20)
CO2: 28 mmol/L (ref 22–32)
Calcium: 9.6 mg/dL (ref 8.9–10.3)
Chloride: 105 mmol/L (ref 101–111)
Creatinine, Ser: 0.93 mg/dL (ref 0.61–1.24)
GFR calc Af Amer: >60 mL/min (ref >60)
GFR calc non Af Amer: >60 mL/min (ref >60)
Glucose, Bld: 216 mg/dL — ABNORMAL HIGH (ref 65–99)
Potassium: 3.9 mmol/L (ref 3.5–5.1)
Sodium: 140 mmol/L (ref 135–145)

## 2016-01-01 LAB — CBG MONITORING, ED: Glucose-Capillary: 87 mg/dL (ref 65–99)

## 2016-01-01 IMAGING — DX DG CHEST 2V
2 series · 2 of 2 positions shown · non-contrast
Comparison: [DATE]

CLINICAL DATA: Dry cough and chest pain

EXAM:
CHEST  2 VIEW

[chest pa]
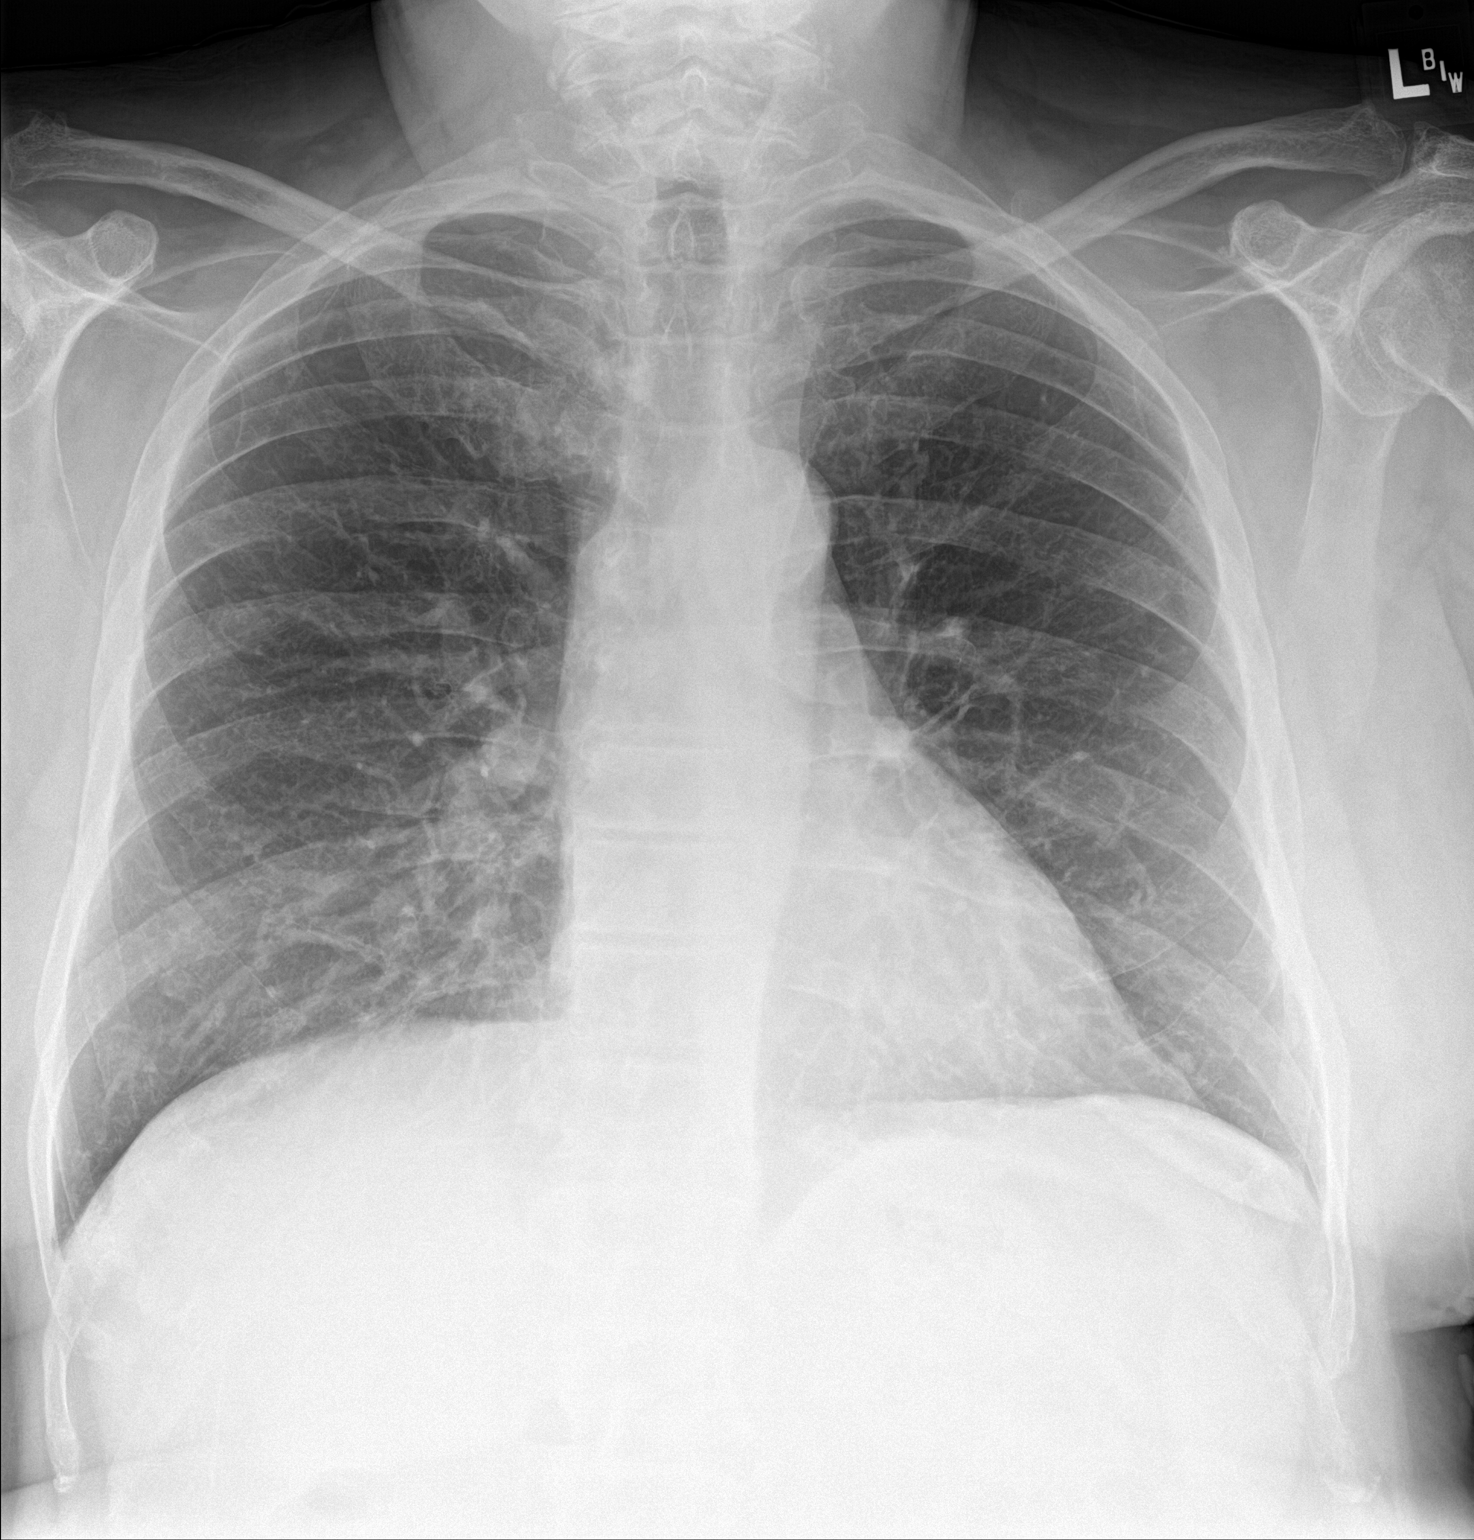

[chest lat]
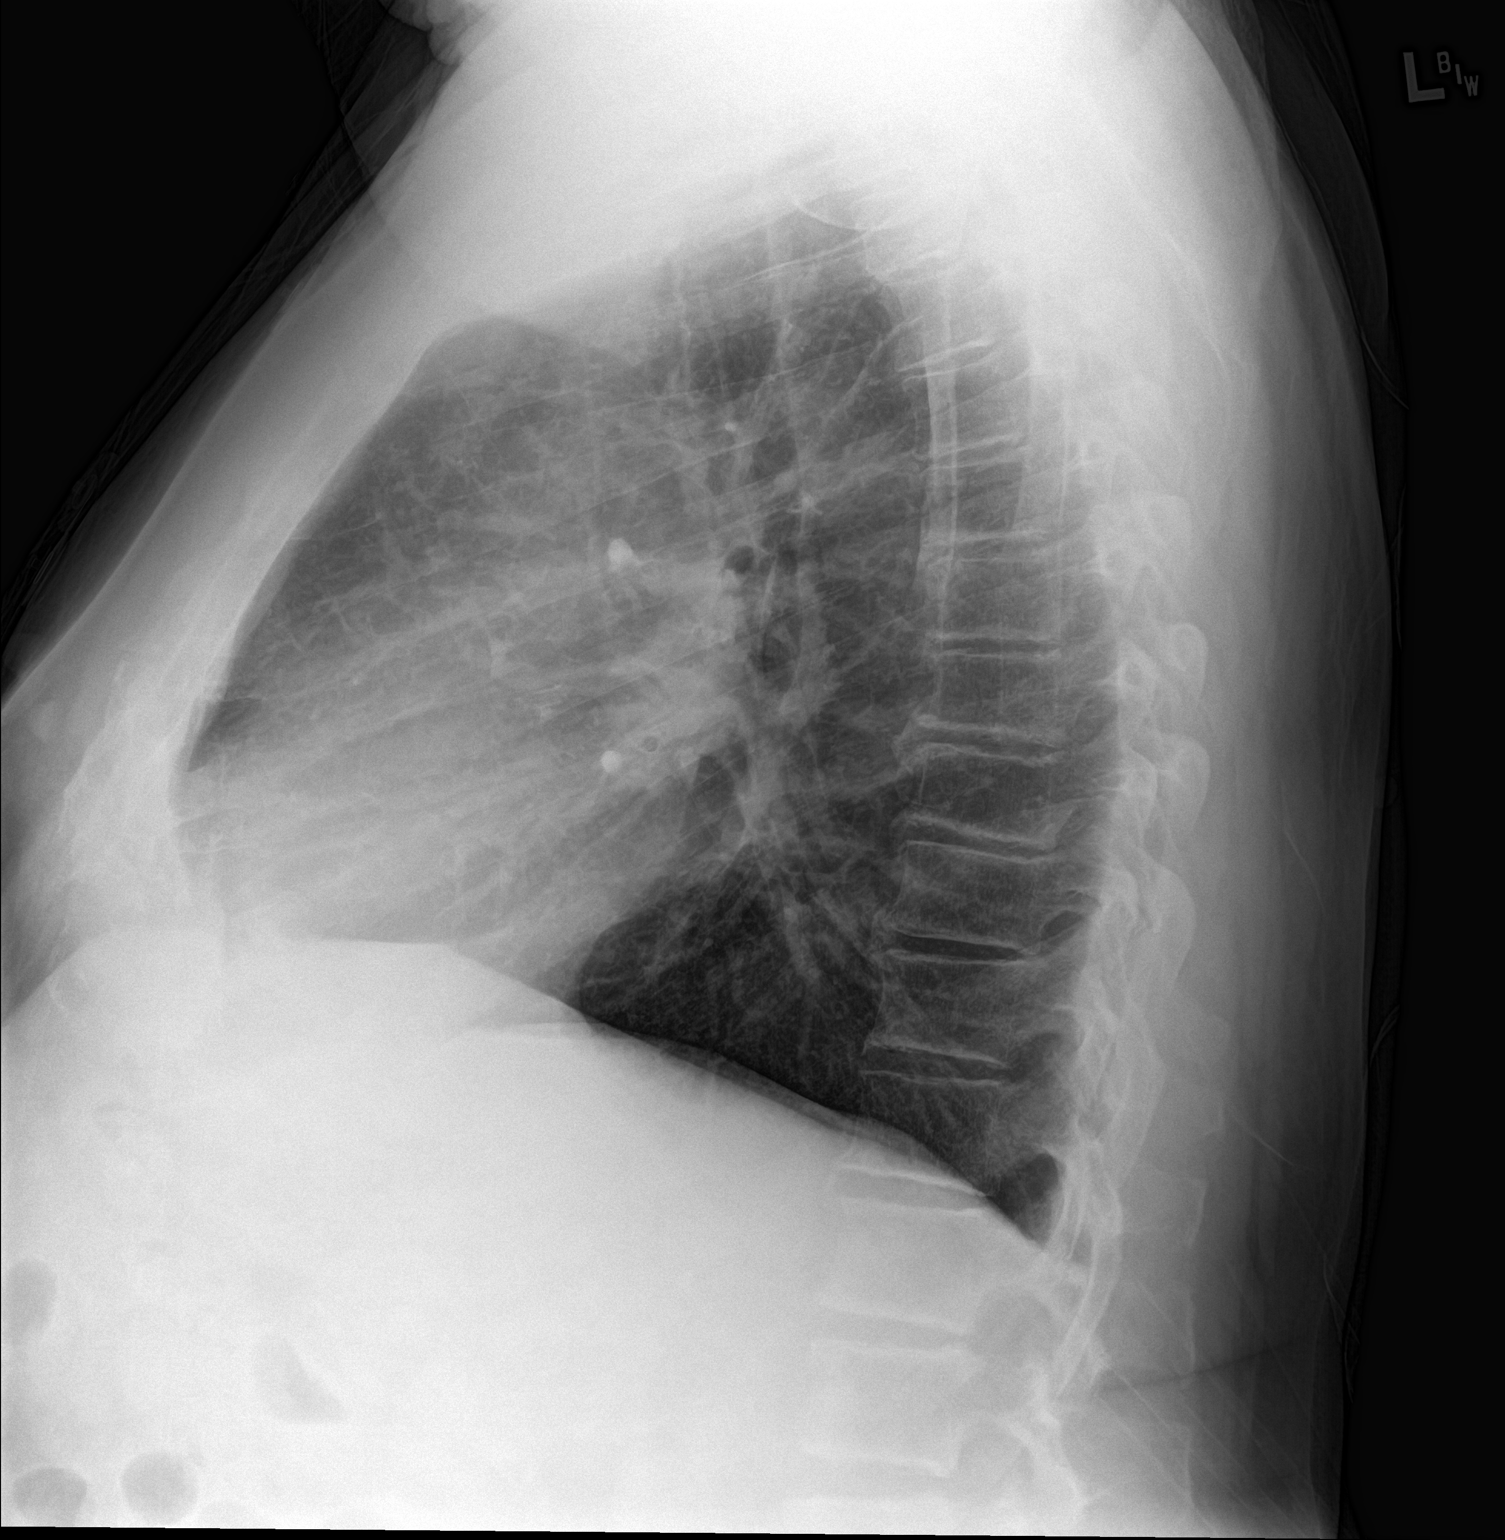

[2 of 2 positions shown; findings below may reference images not displayed]

FINDINGS: The heart size and mediastinal contours are within normal limits.
Both lungs are clear. The visualized skeletal structures are
unremarkable.
IMPRESSION: No active cardiopulmonary disease.

## 2016-01-01 MED ORDER — ALBUTEROL SULFATE HFA 108 (90 BASE) MCG/ACT IN AERS
2.0000 | INHALATION_SPRAY | RESPIRATORY_TRACT | Status: DC | PRN
  Administered 2016-01-01: 2 via RESPIRATORY_TRACT
  Filled 2016-01-01: qty 6.7

## 2016-01-01 MED ORDER — AEROCHAMBER PLUS FLO-VU MEDIUM MISC
1.0000 | Freq: Once | Status: AC
  Administered 2016-01-01: 1

## 2016-01-01 NOTE — ED Notes (Signed)
CBG 87. 

## 2016-01-01 NOTE — ED Notes (Signed)
Pt c/o chest pain onset today. Pt has history of MI in past. Pt has history of angina. Pt tried his home medications without relief. Pain is not relieved with rest. Pt reports shortness of breath and diarrhea.

## 2016-01-01 NOTE — Discharge Instructions (Signed)
1. Medications: usual home medications 2. Treatment: rest, drink plenty of fluids,  3. Follow Up: Please followup with your primary doctor in 2 days for discussion of your diagnoses and further evaluation after today's visit; if you do not have a primary care doctor use the resource guide provided to find one; Please return to the ER for worsening symptoms, chest pain that radiates to the arms or is associated with diaphoresis or nausea   Cough, Adult Coughing is a reflex that clears your throat and your airways. Coughing helps to heal and protect your lungs. It is normal to cough occasionally, but a cough that happens with other symptoms or lasts a long time may be a sign of a condition that needs treatment. A cough may last only 2-3 weeks (acute), or it may last longer than 8 weeks (chronic). CAUSES Coughing is commonly caused by:  Breathing in substances that irritate your lungs.  A viral or bacterial respiratory infection.  Allergies.  Asthma.  Postnasal drip.  Smoking.  Acid backing up from the stomach into the esophagus (gastroesophageal reflux).  Certain medicines.  Chronic lung problems, including COPD (or rarely, lung cancer).  Other medical conditions such as heart failure. HOME CARE INSTRUCTIONS  Pay attention to any changes in your symptoms. Take these actions to help with your discomfort:  Take medicines only as told by your health care provider.  If you were prescribed an antibiotic medicine, take it as told by your health care provider. Do not stop taking the antibiotic even if you start to feel better.  Talk with your health care provider before you take a cough suppressant medicine.  Drink enough fluid to keep your urine clear or pale yellow.  If the air is dry, use a cold steam vaporizer or humidifier in your bedroom or your home to help loosen secretions.  Avoid anything that causes you to cough at work or at home.  If your cough is worse at night, try  sleeping in a semi-upright position.  Avoid cigarette smoke. If you smoke, quit smoking. If you need help quitting, ask your health care provider.  Avoid caffeine.  Avoid alcohol.  Rest as needed. SEEK MEDICAL CARE IF:   You have new symptoms.  You cough up pus.  Your cough does not get better after 2-3 weeks, or your cough gets worse.  You cannot control your cough with suppressant medicines and you are losing sleep.  You develop pain that is getting worse or pain that is not controlled with pain medicines.  You have a fever.  You have unexplained weight loss.  You have night sweats. SEEK IMMEDIATE MEDICAL CARE IF:  You cough up blood.  You have difficulty breathing.  Your heartbeat is very fast.   This information is not intended to replace advice given to you by your health care provider. Make sure you discuss any questions you have with your health care provider.   Document Released: 03/23/2011 Document Revised: 06/15/2015 Document Reviewed: 12/01/2014 Elsevier Interactive Patient Education [employer].

## 2016-01-01 NOTE — ED Provider Notes (Signed)
CSN: [id number]     Arrival date & time 01/01/16  1526 History   First MD Initiated Contact with Patient 01/01/16 1630     Chief Complaint  Patient presents with  . Chest Pain  . Cough     (Consider location/radiation/quality/duration/timing/severity/associated sxs/prior Treatment) The history is provided by medical records. No language interpreter was used.     Vincent [name] is a 65 y.o. male  with a hx of CAD, MI, HTN, hyperlipidemia, presents to the Emergency Department complaining of gradual, persistent, improving central chest pain onset after waking today at approximately 2:30 PM. Patient and wife report that he spent most of the night coughing. He reports it is a dry cough and not productive. He states that at rest he has no chest pain but he developed central chest pain with movements including sitting up and moving his arms. He reports a history of MI but states chest pain at that time was different from what he has today. He does not take aspirin or his nitroglycerin prior to arrival.  No alleviating factors. Patient denies fever, chills, nausea, vomiting, diaphoresis, weakness, dizziness, shortness of breath, abdominal pain, diarrhea, syncope, dysuria.  Record Review shows: He had an anterior MI in 2007 with stent placed in LAD at that time. Last cath 06/23/10 per Dr. Lia Foyer with 30-40% proximal LAD restenosis, 30% mid LAD, 40% mid Circumflex, no RCA disease, LVEF=45-50%. Stress myoview April 2015 with no ischemia.  Past Medical History  Diagnosis Date  . Coronary artery disease   . Hypertension   . Hyperlipidemia   . Hemorrhoids   . Diverticulosis     mild  . Hx of colonic polyps   . Left ventricular dysfunction     hx of  . Acute MI [facility])    Past Surgical History  Procedure Laterality Date  . Angioplasty      and bare metal stent placement  . Coronary angioplasty with stent placement     Family History  Problem Relation Age of Onset  . Coronary artery disease       positive family hx of   Social History  Substance Use Topics  . Smoking status: Current Every Day Smoker    Types: Cigars  . Smokeless tobacco: None  . Alcohol Use: No    Review of Systems  Constitutional: Negative for fever, diaphoresis, appetite change, fatigue and unexpected weight change.  HENT: Negative for mouth sores.   Eyes: Negative for visual disturbance.  Respiratory: Positive for cough. Negative for chest tightness, shortness of breath and wheezing.   Cardiovascular: Positive for chest pain.  Gastrointestinal: Negative for nausea, vomiting, abdominal pain, diarrhea and constipation.  Endocrine: Negative for polydipsia, polyphagia and polyuria.  Genitourinary: Negative for dysuria, urgency, frequency and hematuria.  Musculoskeletal: Negative for back pain and neck stiffness.  Skin: Negative for rash.  Allergic/Immunologic: Negative for immunocompromised state.  Neurological: Negative for syncope, light-headedness and headaches.  Hematological: Does not bruise/bleed easily.  Psychiatric/Behavioral: Negative for sleep disturbance. The patient is not nervous/anxious.       Allergies  Penicillins  Home Medications   Prior to Admission medications   Medication Sig Start Date End Date Taking? Authorizing Provider  aspirin 81 MG tablet Take 81 mg by mouth daily.     Yes Historical Provider, MD  clonazePAM (KLONOPIN) 1 MG tablet Take 1 mg by mouth 2 (two) times daily as needed for anxiety.    Yes Historical Provider, MD  furosemide (LASIX) 40 MG tablet Take  40 mg by mouth 2 (two) times daily.     Yes Historical Provider, MD  HYDROcodone-acetaminophen (NORCO/VICODIN) 5-325 MG tablet Take 1 tablet by mouth every 8 (eight) hours as needed for moderate pain.  12/05/15  Yes Historical Provider, MD  isosorbide mononitrate (IMDUR) 30 MG 24 hr tablet Take 1 tablet (30 mg total) by mouth daily. 09/06/15  Yes Burnell Blanks, MD  meloxicam (MOBIC) 7.5 MG tablet Take 1 tablet  (7.5 mg total) by mouth daily. Patient taking differently: Take 7.5 mg by mouth 2 (two) times daily.  03/20/14  Yes April Palumbo, MD  metFORMIN (GLUCOPHAGE) 500 MG tablet Take 500 mg by mouth 2 (two) times daily.    Yes Historical Provider, MD  metoprolol (LOPRESSOR) 50 MG tablet TAKE 1 TABLET BY MOUTH TWICE DAILY 10/05/15  Yes Burnell Blanks, MD  nitroGLYCERIN (NITROSTAT) 0.4 MG SL tablet Place 1 tablet (0.4 mg total) under the tongue every 5 (five) minutes as needed for chest pain. 09/06/15  Yes Burnell Blanks, MD  ramipril (ALTACE) 2.5 MG tablet Take 2.5 mg by mouth daily.     Yes Historical Provider, MD  simvastatin (ZOCOR) 40 MG tablet TAKE 1 TABLET BY MOUTH AT BEDTIME 09/07/15  Yes Burnell Blanks, MD  sitaGLIPtin (JANUVIA) 100 MG tablet Take 100 mg by mouth every evening.    Yes Historical Provider, MD  traMADol (ULTRAM) 50 MG tablet Take 1 tablet by mouth every 8 (eight) hours as needed for moderate pain.  01/01/14  Yes Historical Provider, MD  pantoprazole (PROTONIX) 40 MG tablet Take 1 tablet (40 mg total) by mouth daily. 01/06/14   Gregor Hams, MD   BP 102/76 mmHg  Pulse 75  Temp(Src) 98 F (36.7 C) (Oral)  Resp 10  SpO2 97% Physical Exam  Constitutional: He appears well-developed and well-nourished. No distress.  Awake, alert, nontoxic appearance  HENT:  Head: Normocephalic and atraumatic.  Mouth/Throat: Oropharynx is clear and moist. No oropharyngeal exudate.  Eyes: Conjunctivae are normal. No scleral icterus.  Neck: Normal range of motion. Neck supple.  Cardiovascular: Normal rate, regular rhythm, normal heart sounds and intact distal pulses.   Pulmonary/Chest: Effort normal and breath sounds normal. No respiratory distress. He has no wheezes.  Equal chest expansion  Abdominal: Soft. Bowel sounds are normal. He exhibits no mass. There is no tenderness. There is no rebound and no guarding.  Musculoskeletal: Normal range of motion. He exhibits no edema.   Neurological: He is alert.  Speech is clear and goal oriented Moves extremities without ataxia  Skin: Skin is warm and dry. He is not diaphoretic.  Psychiatric: He has a normal mood and affect.  Nursing note and vitals reviewed.   ED Course  Procedures (including critical care time) Labs Review Labs Reviewed  BASIC METABOLIC PANEL - Abnormal; Notable for the following:    Glucose, Bld 216 (*)    All other components within normal limits  CBC  I-STAT TROPOININ, ED  I-STAT TROPOININ, ED  CBG MONITORING, ED    Imaging Review Dg Chest 2 View  01/01/2016  CLINICAL DATA:  Dry cough and chest pain EXAM: CHEST  2 VIEW COMPARISON:  10/26/2014 FINDINGS: The heart size and mediastinal contours are within normal limits. Both lungs are clear. The visualized skeletal structures are unremarkable. IMPRESSION: No active cardiopulmonary disease. Electronically Signed   By: Inez Catalina M.D.   On: 01/01/2016 16:25   I have personally reviewed and evaluated these images and lab results as  part of my medical decision-making.   EKG Interpretation   Date/Time:  Sunday January 01 2016 15:31:35 EDT Ventricular Rate:  77 PR Interval:  182 QRS Duration: 100 QT Interval:  374 QTC Calculation: 423 R Axis:   2 Text Interpretation:  Normal sinus rhythm Low voltage QRS Cannot rule out  Anterior infarct , age undetermined Abnormal ECG Confirmed by Jeneen Rinks  MD,  [place] ([id number]) on 01/01/2016 7:00:32 PM      MDM   Final diagnoses:  Chest pain, unspecified chest pain type  Cough  Viral URI    Vincent [name] presents chest pain is worse with coughing and moving.  Patient is to be discharged with recommendation to follow up with PCP in regards to today's hospital visit. Chest pain is not likely of cardiac or pulmonary etiology d/t presentation, PERC negative, VSS, no tracheal deviation, no JVD or new murmur, RRR, breath sounds equal bilaterally, EKG without acute abnormalities, negative troponin x2, and  negative CXR. Pt has been advised to return to the ED if CP becomes exertional, associated with diaphoresis or nausea, radiates to left jaw/arm, worsens or becomes concerning in any way. Pt appears reliable for follow up and is agreeable to discharge.   Case has been discussed with Dr. Jeneen Rinks who agrees with the above plan to discharge.   Jarrett Soho Landri Dorsainvil, PA-C 01/01/16 2144  Tanna Furry, MD 01/04/16 [phone]

## 2016-05-12 ENCOUNTER — Encounter (HOSPITAL_COMMUNITY): Payer: Self-pay | Admitting: Emergency Medicine

## 2016-05-12 ENCOUNTER — Emergency Department (HOSPITAL_COMMUNITY)
Admission: EM | Admit: 2016-05-12 | Discharge: 2016-05-12 | Disposition: A | Discharge status: Home / Self Care | Payer: BLUE CROSS/BLUE SHIELD | Attending: Emergency Medicine | Admitting: Emergency Medicine

## 2016-05-12 ENCOUNTER — Emergency Department (HOSPITAL_COMMUNITY): Payer: BLUE CROSS/BLUE SHIELD

## 2016-05-12 DIAGNOSIS — R791 Abnormal coagulation profile: Secondary | ICD-10-CM | POA: Insufficient documentation

## 2016-05-12 DIAGNOSIS — Z955 Presence of coronary angioplasty implant and graft: Secondary | ICD-10-CM | POA: Diagnosis not present

## 2016-05-12 DIAGNOSIS — F909 Attention-deficit hyperactivity disorder, unspecified type: Secondary | ICD-10-CM | POA: Diagnosis not present

## 2016-05-12 DIAGNOSIS — I10 Essential (primary) hypertension: Secondary | ICD-10-CM | POA: Insufficient documentation

## 2016-05-12 DIAGNOSIS — G9340 Encephalopathy, unspecified: Secondary | ICD-10-CM | POA: Diagnosis not present

## 2016-05-12 DIAGNOSIS — I252 Old myocardial infarction: Secondary | ICD-10-CM | POA: Insufficient documentation

## 2016-05-12 DIAGNOSIS — F17210 Nicotine dependence, cigarettes, uncomplicated: Secondary | ICD-10-CM | POA: Insufficient documentation

## 2016-05-12 DIAGNOSIS — Z7984 Long term (current) use of oral hypoglycemic drugs: Secondary | ICD-10-CM | POA: Diagnosis not present

## 2016-05-12 DIAGNOSIS — I2510 Atherosclerotic heart disease of native coronary artery without angina pectoris: Secondary | ICD-10-CM | POA: Insufficient documentation

## 2016-05-12 DIAGNOSIS — R4781 Slurred speech: Secondary | ICD-10-CM | POA: Diagnosis present

## 2016-05-12 DIAGNOSIS — Z7982 Long term (current) use of aspirin: Secondary | ICD-10-CM | POA: Diagnosis not present

## 2016-05-12 DIAGNOSIS — Z79899 Other long term (current) drug therapy: Secondary | ICD-10-CM | POA: Insufficient documentation

## 2016-05-12 DIAGNOSIS — G458 Other transient cerebral ischemic attacks and related syndromes: Secondary | ICD-10-CM

## 2016-05-12 LAB — URINALYSIS, ROUTINE W REFLEX MICROSCOPIC
Bilirubin Urine: NEGATIVE
Glucose, UA: NEGATIVE mg/dL
Hgb urine dipstick: NEGATIVE
Ketones, ur: NEGATIVE mg/dL
Leukocytes, UA: NEGATIVE
Nitrite: NEGATIVE
Protein, ur: NEGATIVE mg/dL
Specific Gravity, Urine: 1.013 (ref 1.005–1.030)
pH: 5.5 (ref 5.0–8.0)

## 2016-05-12 LAB — COMPREHENSIVE METABOLIC PANEL
ALT: 22 U/L (ref 17–63)
AST: 28 U/L (ref 15–41)
Albumin: 4.1 g/dL (ref 3.5–5.0)
Alkaline Phosphatase: 83 U/L (ref 38–126)
Anion gap: 8 (ref 5–15)
BUN: 11 mg/dL (ref 6–20)
CO2: 22 mmol/L (ref 22–32)
Calcium: 9.6 mg/dL (ref 8.9–10.3)
Chloride: 105 mmol/L (ref 101–111)
Creatinine, Ser: 0.98 mg/dL (ref 0.61–1.24)
GFR calc Af Amer: >60 mL/min (ref >60)
GFR calc non Af Amer: >60 mL/min (ref >60)
Glucose, Bld: 140 mg/dL — ABNORMAL HIGH (ref 65–99)
Potassium: 4.1 mmol/L (ref 3.5–5.1)
Sodium: 135 mmol/L (ref 135–145)
Total Bilirubin: 0.4 mg/dL (ref 0.3–1.2)
Total Protein: 6.6 g/dL (ref 6.5–8.1)

## 2016-05-12 LAB — I-STAT TROPONIN, ED: Troponin i, poc: 0 ng/mL (ref 0.00–0.08)

## 2016-05-12 LAB — CBC
HCT: 43.1 % (ref 39.0–52.0)
Hemoglobin: 14.3 g/dL (ref 13.0–17.0)
MCH: 30.7 pg (ref 26.0–34.0)
MCHC: 33.2 g/dL (ref 30.0–36.0)
MCV: 92.5 fL (ref 78.0–100.0)
Platelets: 214 10*3/uL (ref 150–400)
RBC: 4.66 MIL/uL (ref 4.22–5.81)
RDW: 13.5 % (ref 11.5–15.5)
WBC: 9.9 10*3/uL (ref 4.0–10.5)

## 2016-05-12 LAB — DIFFERENTIAL
Basophils Absolute: 0 10*3/uL (ref 0.0–0.1)
Basophils Relative: 0 %
Eosinophils Absolute: 0.2 10*3/uL (ref 0.0–0.7)
Eosinophils Relative: 2 %
Lymphocytes Relative: 27 %
Lymphs Abs: 2.6 10*3/uL (ref 0.7–4.0)
Monocytes Absolute: 0.9 10*3/uL (ref 0.1–1.0)
Monocytes Relative: 9 %
Neutro Abs: 6.1 10*3/uL (ref 1.7–7.7)
Neutrophils Relative %: 62 %

## 2016-05-12 LAB — CBG MONITORING, ED: Glucose-Capillary: 157 mg/dL — ABNORMAL HIGH (ref 65–99)

## 2016-05-12 LAB — I-STAT CHEM 8, ED
BUN: 11 mg/dL (ref 6–20)
Calcium, Ion: 1.16 mmol/L (ref 1.12–1.23)
Chloride: 103 mmol/L (ref 101–111)
Creatinine, Ser: 0.9 mg/dL (ref 0.61–1.24)
Glucose, Bld: 139 mg/dL — ABNORMAL HIGH (ref 65–99)
HCT: 42 % (ref 39.0–52.0)
Hemoglobin: 14.3 g/dL (ref 13.0–17.0)
Potassium: 4.1 mmol/L (ref 3.5–5.1)
Sodium: 138 mmol/L (ref 135–145)
TCO2: 23 mmol/L (ref 0–100)

## 2016-05-12 LAB — PROTIME-INR
INR: 0.99
Prothrombin Time: 13.1 seconds (ref 11.4–15.2)

## 2016-05-12 LAB — APTT: aPTT: 31 seconds (ref 24–36)

## 2016-05-12 IMAGING — CR DG CHEST 1V PORT
1 series · 1 of 1 positions shown · non-contrast
Comparison: [DATE]

CLINICAL DATA: Aphasia this AM and left side weakness x 2 days; Pt
reports h/o "widow maker in [5U]"; h/o HTN; smoker

EXAM:
PORTABLE CHEST 1 VIEW

[AP]
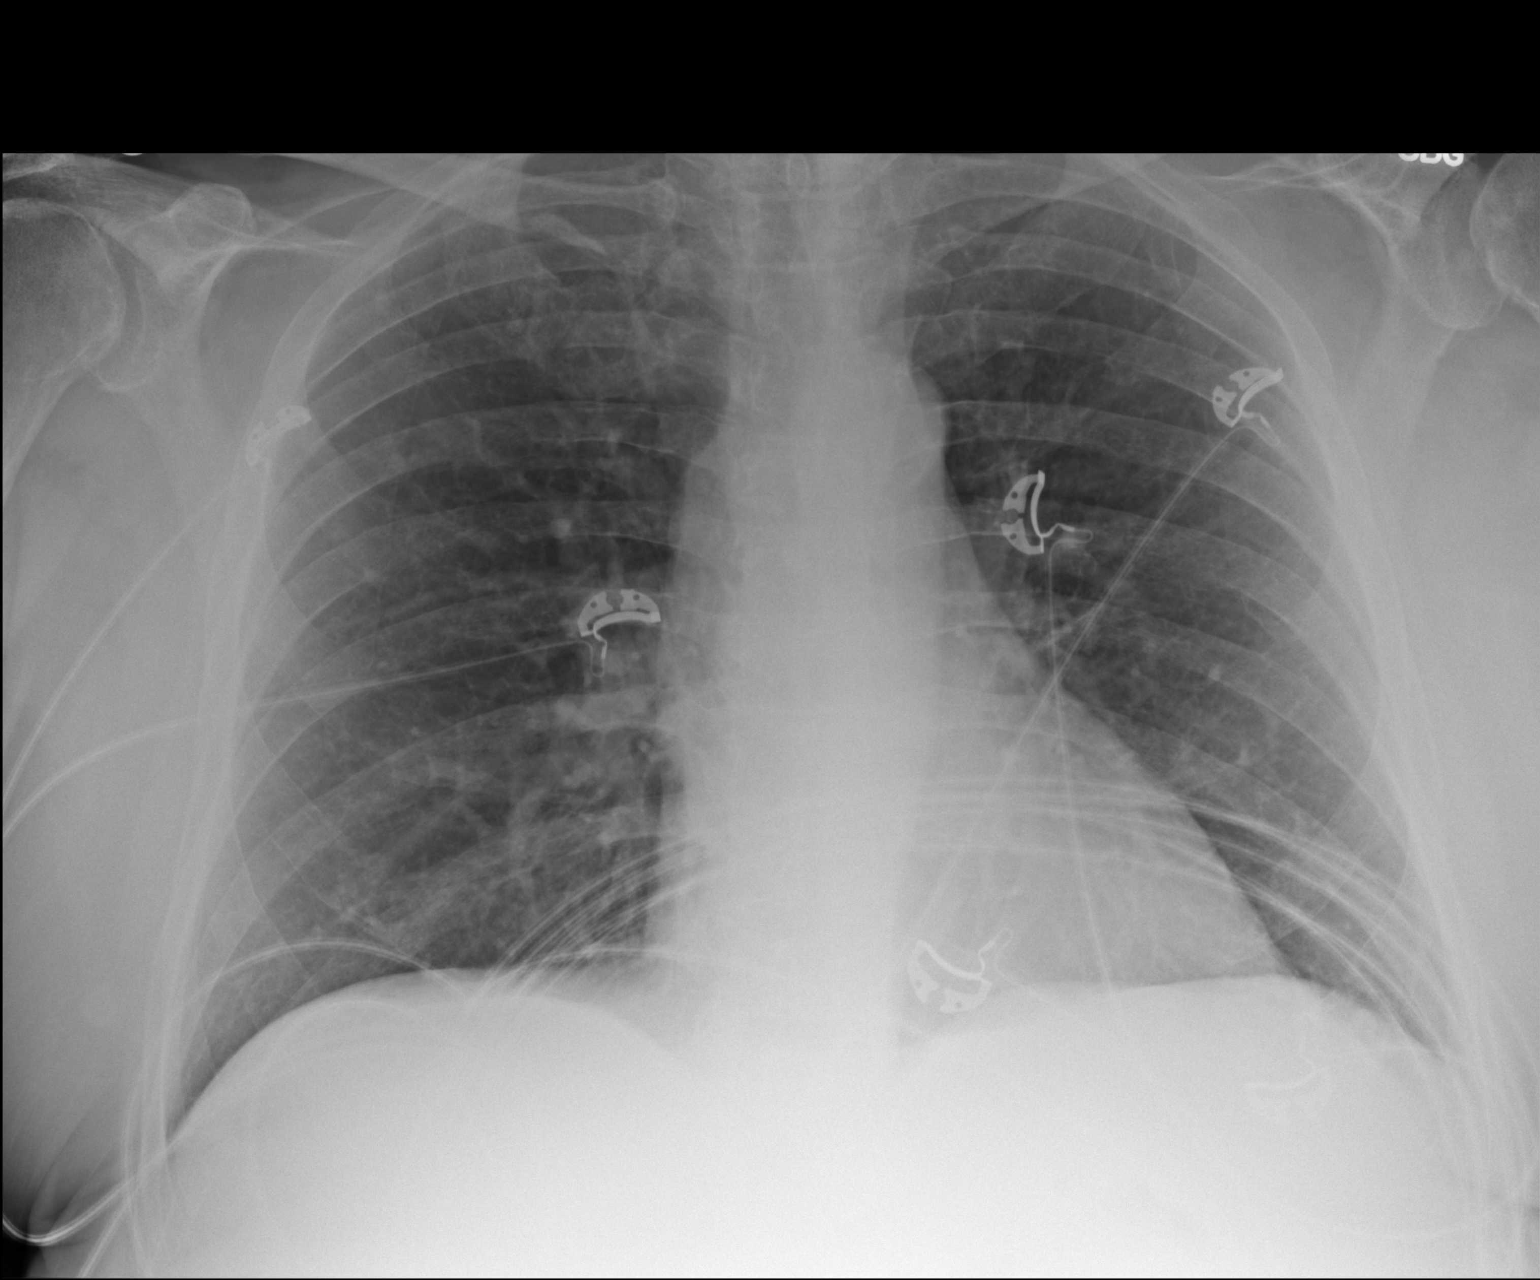

[1 of 1 positions shown; findings below may reference images not displayed]

FINDINGS: Normal mediastinum and cardiac silhouette. Normal pulmonary
vasculature. No evidence of effusion, infiltrate, or pneumothorax.
No acute bony abnormality.
IMPRESSION: No acute cardiopulmonary process.

## 2016-05-12 IMAGING — CT CT HEAD CODE STROKE
3 of 4 series · 16 of 47 positions shown, 19 images · non-contrast
Comparison: [DATE]

CLINICAL DATA: Code stroke.  Aphasia.  Confusion.

EXAM:
CT HEAD WITHOUT CONTRAST
TECHNIQUE: Contiguous axial images were obtained from the base of the skull
through the vertex without intravenous contrast.

[Series 201: head w/o, idose (1) · axial · non-contrast · 0.43mm/px · z∈[+50,+190]mm · 10 of 34 slices shown, 13 images]
[im 3/34  brain]
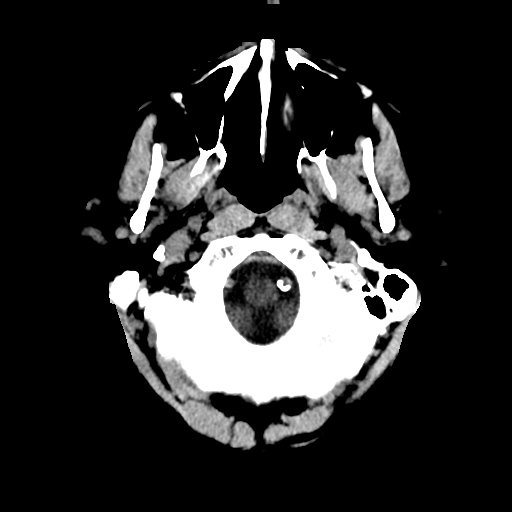
[im 3/34  bone]
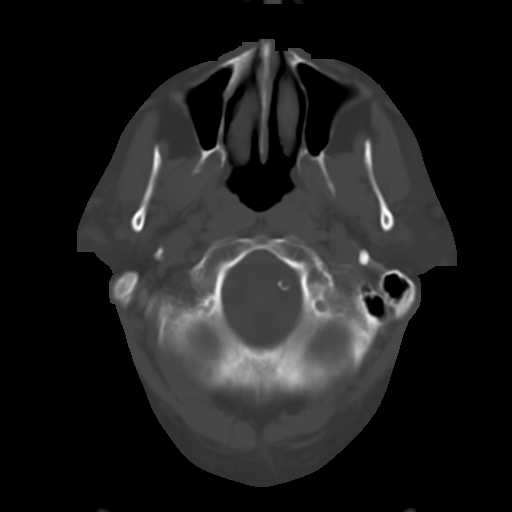
[im 5/34  brain]
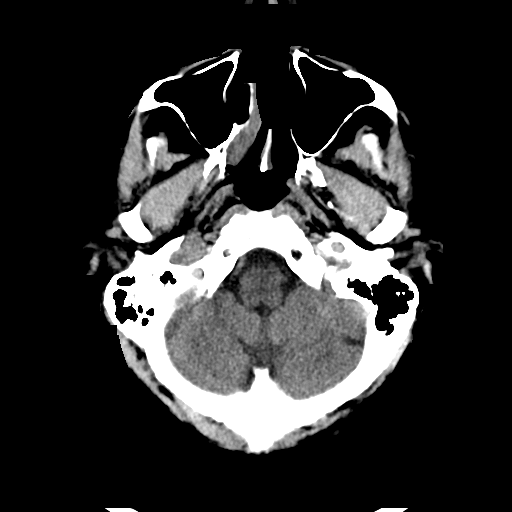
[im 10/34  brain]
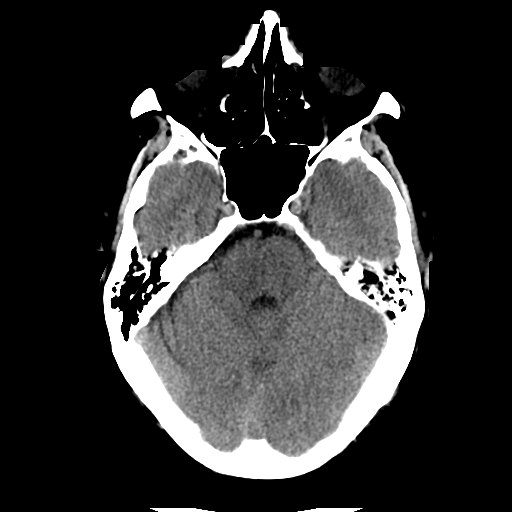
[im 12/34  brain]
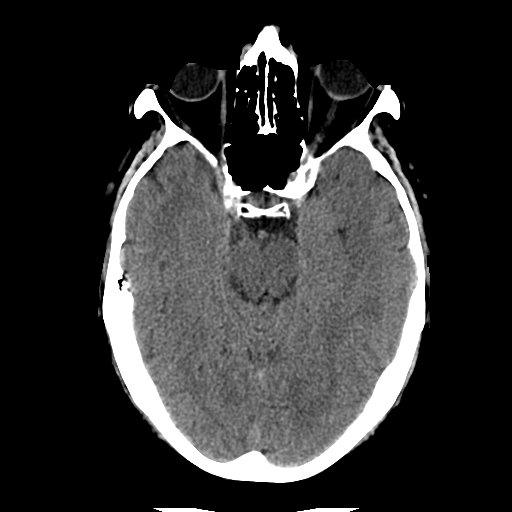
[im 15/34  brain]
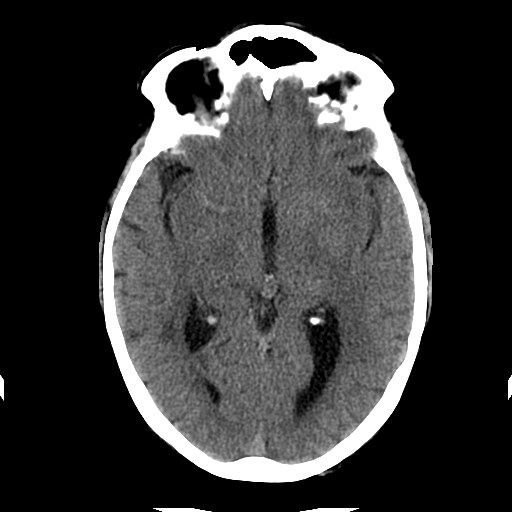
[im 15/34  bone]
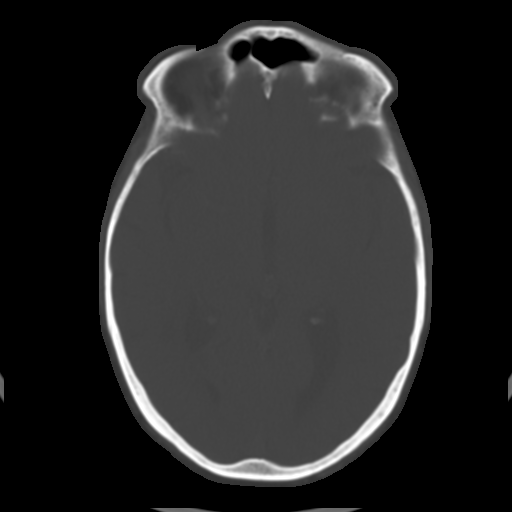
[im 19/34  brain]
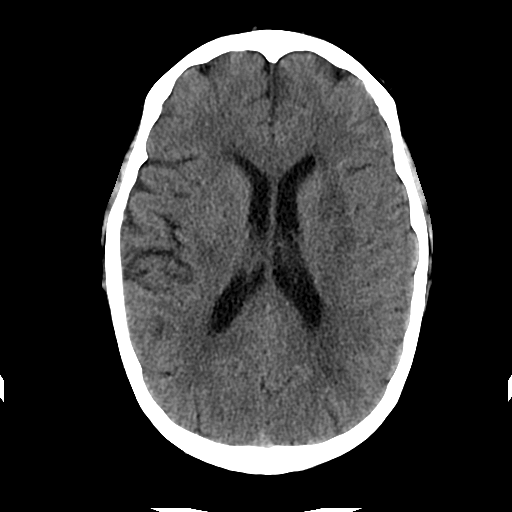
[im 22/34  brain]
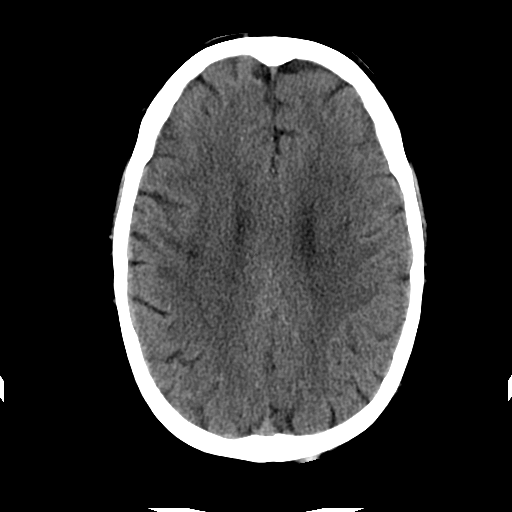
[im 24/34  brain]
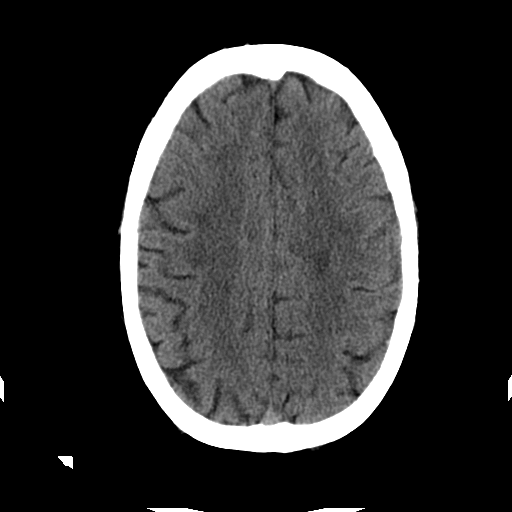
[im 29/34  brain]
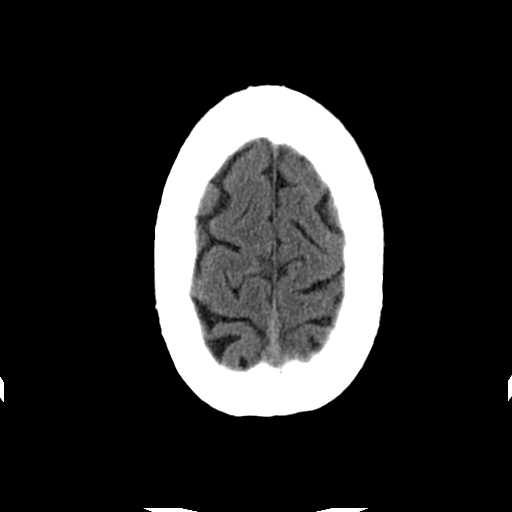
[im 29/34  bone]
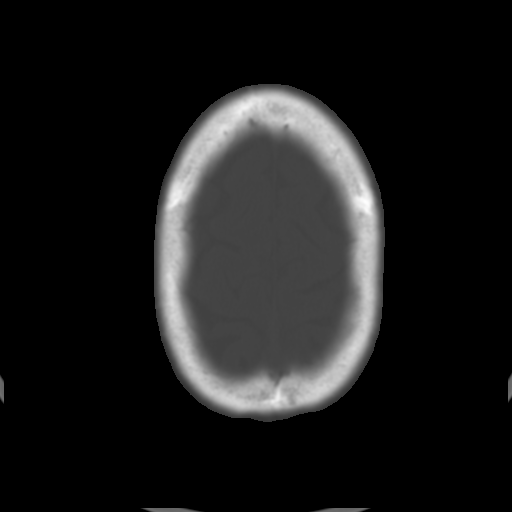
[im 31/34  brain]
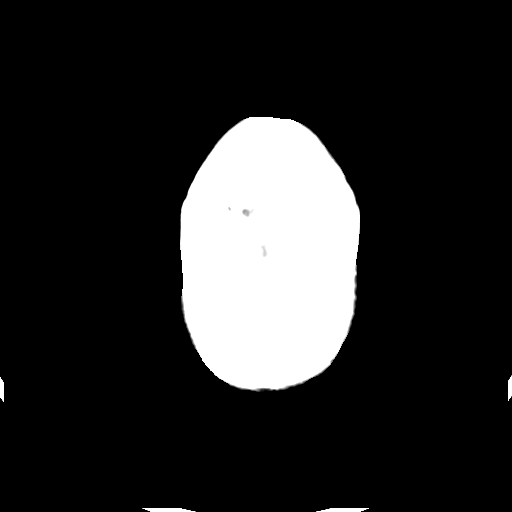

[Series 203: coronal st, idose (1) · coronal · 0.40mm/px · 3 of 73 slices shown]
[im 25/73  brain]
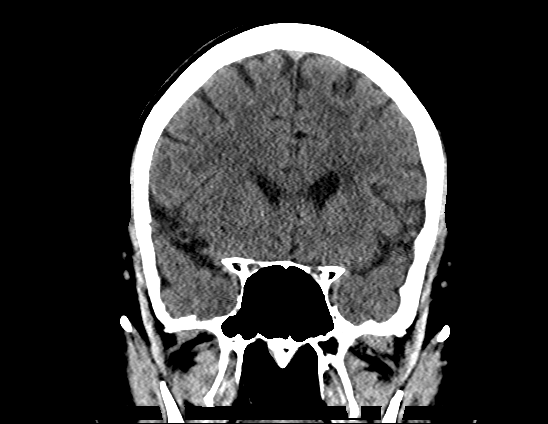
[im 33/73  brain]
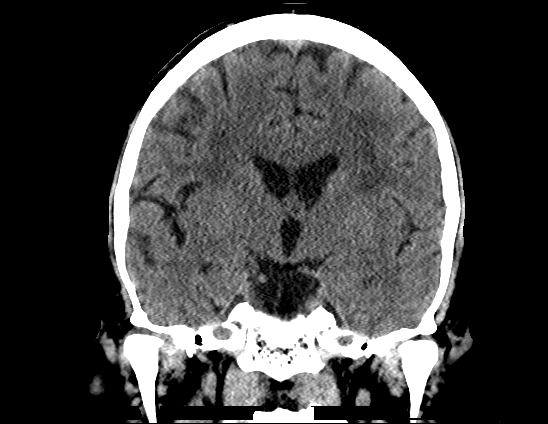
[im 41/73  brain]
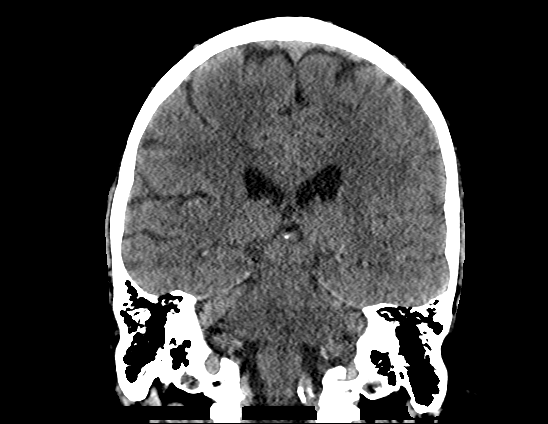

[Series 204: sagittal st, idose (1) · sagittal · 0.40mm/px · 3 of 73 slices shown]
[im 25/73  brain]
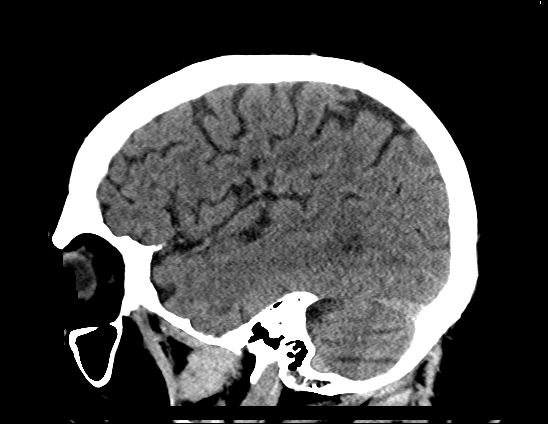
[im 37/73  brain]
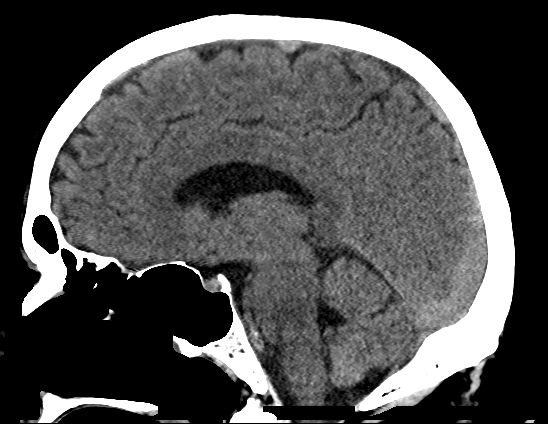
[im 49/73  brain]
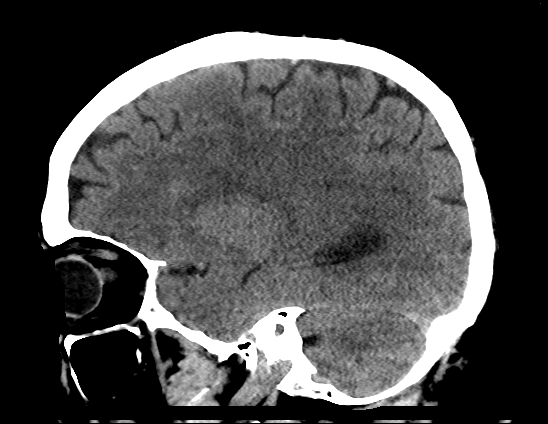

[16 of 47 positions shown; findings below may reference images not displayed]

FINDINGS: There is no evidence of acute cortical infarct, intracranial
hemorrhage, mass, midline shift, or extra-axial fluid collection.
Ventricles and sulci are normal. A chronic lacunar infarct is again
seen involving the left lentiform nucleus. There is a small chronic
infarct inferolaterally in the left cerebellum which appears new.
Patchy hypodensities throughout the cerebral white matter have
progressed from the prior study and are nonspecific but compatible
with moderate chronic small vessel ischemic disease.

Orbits are unremarkable. The visualized paranasal sinuses and
mastoid air cells are clear. No acute osseous abnormality is
identified. Calcified atherosclerosis is noted at the skullbase.

ASPECTS ([place] Stroke Program Early CT Score,
[URL]

- Ganglionic level infarction (caudate, lentiform nuclei, internal
capsule, insula, M1-M3 cortex): 7

- Supraganglionic infarction (M4-M6 cortex): 3

Total score (0-10 with 10 being normal): 10
IMPRESSION: 1. No evidence of acute intracranial abnormality.
2. ASPECTS score 10.
3. Progressive, moderate chronic small vessel ischemic disease. New
chronic left cerebellar infarct appear
[REDACTED] paged at 2 pm.

These results were called by telephone on [DATE] at [DATE] to Dr.
KARL-ANTON, who verbally acknowledged these results.

## 2016-05-12 MED ORDER — LORAZEPAM 1 MG PO TABS
1.0000 mg | ORAL_TABLET | Freq: Once | ORAL | Status: AC
  Administered 2016-05-12: 1 mg via ORAL
  Filled 2016-05-12: qty 1

## 2016-05-12 MED ORDER — HYDROCODONE-ACETAMINOPHEN 5-325 MG PO TABS
1.0000 | ORAL_TABLET | Freq: Once | ORAL | Status: AC
  Administered 2016-05-12: 1 via ORAL
  Filled 2016-05-12: qty 1

## 2016-05-12 NOTE — ED Triage Notes (Signed)
Pt started having aphasia this am, has been weak on left side for 2 days.

## 2016-05-12 NOTE — ED Provider Notes (Addendum)
I saw and evaluated the patient, reviewed the resident's note and I agree with the findings and plan.   EKG Interpretation  Date/Time:  Saturday May 12 2016 13:51:21 EDT Ventricular Rate:  82 PR Interval:    QRS Duration: 108 QT Interval:  361 QTC Calculation: 422 R Axis:   10 Text Interpretation:  Sinus rhythm Low voltage, precordial leads Abnormal R-wave progression, early transition Borderline T abnormalities, inferior leads Baseline wander in lead(s) V1 No significant change since last tracing Confirmed by Marlisa Caridi  MD, Adonis Yim [phone]) on 05/12/2016 3:31:30 PM       Results for orders placed or performed during the hospital encounter of 05/12/16  Protime-INR  Result Value Ref Range   Prothrombin Time 13.1 11.4 - 15.2 seconds   INR 0.99   APTT  Result Value Ref Range   aPTT 31 24 - 36 seconds  CBC  Result Value Ref Range   WBC 9.9 4.0 - 10.5 K/uL   RBC 4.66 4.22 - 5.81 MIL/uL   Hemoglobin 14.3 13.0 - 17.0 g/dL   HCT 43.1 39.0 - 52.0 %   MCV 92.5 78.0 - 100.0 fL   MCH 30.7 26.0 - 34.0 pg   MCHC 33.2 30.0 - 36.0 g/dL   RDW 13.5 11.5 - 15.5 %   Platelets 214 150 - 400 K/uL  Differential  Result Value Ref Range   Neutrophils Relative % 62 %   Neutro Abs 6.1 1.7 - 7.7 K/uL   Lymphocytes Relative 27 %   Lymphs Abs 2.6 0.7 - 4.0 K/uL   Monocytes Relative 9 %   Monocytes Absolute 0.9 0.1 - 1.0 K/uL   Eosinophils Relative 2 %   Eosinophils Absolute 0.2 0.0 - 0.7 K/uL   Basophils Relative 0 %   Basophils Absolute 0.0 0.0 - 0.1 K/uL  Comprehensive metabolic panel  Result Value Ref Range   Sodium 135 135 - 145 mmol/L   Potassium 4.1 3.5 - 5.1 mmol/L   Chloride 105 101 - 111 mmol/L   CO2 22 22 - 32 mmol/L   Glucose, Bld 140 (H) 65 - 99 mg/dL   BUN 11 6 - 20 mg/dL   Creatinine, Ser 0.98 0.61 - 1.24 mg/dL   Calcium 9.6 8.9 - 10.3 mg/dL   Total Protein 6.6 6.5 - 8.1 g/dL   Albumin 4.1 3.5 - 5.0 g/dL   AST 28 15 - 41 U/L   ALT 22 17 - 63 U/L   Alkaline Phosphatase 83 38  - 126 U/L   Total Bilirubin 0.4 0.3 - 1.2 mg/dL   GFR calc non Af Amer >60 >60 mL/min   GFR calc Af Amer >60 >60 mL/min   Anion gap 8 5 - 15  I-stat troponin, ED  Result Value Ref Range   Troponin i, poc 0.00 0.00 - 0.08 ng/mL   Comment 3          CBG monitoring, ED  Result Value Ref Range   Glucose-Capillary 157 (H) 65 - 99 mg/dL   Comment 1 Notify RN   I-Stat Chem 8, ED  Result Value Ref Range   Sodium 138 135 - 145 mmol/L   Potassium 4.1 3.5 - 5.1 mmol/L   Chloride 103 101 - 111 mmol/L   BUN 11 6 - 20 mg/dL   Creatinine, Ser 0.90 0.61 - 1.24 mg/dL   Glucose, Bld 139 (H) 65 - 99 mg/dL   Calcium, Ion 1.16 1.12 - 1.23 mmol/L   TCO2 23 0 - 100  mmol/L   Hemoglobin 14.3 13.0 - 17.0 g/dL   HCT 42.0 39.0 - 52.0 %   Ct Head Code Stroke W/o Cm  Result Date: 05/12/2016 CLINICAL DATA:  Code stroke.  Aphasia.  Confusion. EXAM: CT HEAD WITHOUT CONTRAST TECHNIQUE: Contiguous axial images were obtained from the base of the skull through the vertex without intravenous contrast. COMPARISON:  09/01/2010 FINDINGS: There is no evidence of acute cortical infarct, intracranial hemorrhage, mass, midline shift, or extra-axial fluid collection. Ventricles and sulci are normal. A chronic lacunar infarct is again seen involving the left lentiform nucleus. There is a small chronic infarct inferolaterally in the left cerebellum which appears new. Patchy hypodensities throughout the cerebral white matter have progressed from the prior study and are nonspecific but compatible with moderate chronic small vessel ischemic disease. Orbits are unremarkable. The visualized paranasal sinuses and mastoid air cells are clear. No acute osseous abnormality is identified. Calcified atherosclerosis is noted at the skullbase. ASPECTS [facility] Stroke Program Early CT Score, http://[email or web address].[email or web address]) - Ganglionic level infarction (caudate, lentiform nuclei, internal capsule, insula, M1-M3 cortex): 7 - Supraganglionic infarction  (M4-M6 cortex): 3 Total score (0-10 with 10 being normal): 10 IMPRESSION: 1. No evidence of acute intracranial abnormality. 2. ASPECTS score 10. 3. Progressive, moderate chronic small vessel ischemic disease. New chronic left cerebellar infarct appear Stroke service paged at 2 pm. These results were called by telephone on 05/12/2016 at 2:15 pm to Dr. Rogene Houston, who verbally acknowledged these results. Electronically Signed   By: Logan Bores M.D.   On: 05/12/2016 14:17   CRITICAL CARE Performed by: Fredia Sorrow Total critical care time: 30 minutes Critical care time was exclusive of separately billable procedures and treating other patients. Critical care was necessary to treat or prevent imminent or life-threatening deterioration. Critical care was time spent personally by me on the following activities: development of treatment plan with patient and/or surrogate as well as nursing, discussions with consultants, evaluation of patient's response to treatment, examination of patient, obtaining history from patient or surrogate, ordering and performing treatments and interventions, ordering and review of laboratory studies, ordering and review of radiographic studies, pulse oximetry and re-evaluation of patient's condition.  Patient seen by me along with the resident upon his presentation. Patient was brought in by family members patient with speech difficulties concerning initially for stroke. Code stroke was called. Head CT without any acute changes.   Patient without any other focal deficits. As time went on patient seen by [facility] hospitalist stroke team and they felt that symptoms could be consistent with encephalopathy. Family members stated that patient does have kind of hyperactivity problems. Patient started to verbalize more give patient Ativan symptoms resolved almost completely. Speech is now normal for patient patient has memory of everything that occurred before. Stated that he was just  stuttering. Also had a complaint of some left arm pain which is extra chronic and that is improved as well. Clinically we think that this was probably not a TIA or stroke that this was most likely a psychosomatic disorder presentation. Patient has no complaint of any head pain.  Patient stable for discharge home and return for any new or worse symptoms.  Patient is alert and oriented follows commands fine.    Fredia Sorrow, MD 05/12/16 [place], MD 05/12/16 1539

## 2016-05-12 NOTE — Consult Note (Signed)
Neurology Consult Note  Reason for Consultation: CODE STROKE  Requesting provider: Fredia Sorrow, MD  CC: slurred speech  HPI: This is a 65 year old right-handed man who is brought to the emergency department for evaluation of slurred speech and possible left-sided weakness. History is taken from the patient although this is limited because the patient is obviously confused. His family is present with him and offers additional information as needed.  The patient is apparently been dealing with some left neck, shoulder, and arm pain for the past several days. It sounds as if this pain limits his ability to move his arm. This morning, however, he had what is described as confusion and slurred speech. The patient has a significant stutter at baseline though his family reports that his speech is much less clear today than usual. They also endorsed that he is confused. His wife states that he appeared to have some facial droop as well. There has not been any obvious weakness, numbness, tingling, vision changes, difficulty swallowing, vertigo, or clumsiness.  Last known well: 11:30 NIH stroke scale score: 0 TPA given: No, low NIH stroke scale score, presentation not clearly consistent with stroke  PMH:  Past Medical History:  Diagnosis Date  . Acute MI ([place])   . Coronary artery disease   . Diverticulosis    mild  . Hemorrhoids   . Hx of colonic polyps   . Hyperlipidemia   . Hypertension   . Left ventricular dysfunction    hx of    PSH:  Past Surgical History:  Procedure Laterality Date  . ANGIOPLASTY     and bare metal stent placement  . CORONARY ANGIOPLASTY WITH STENT PLACEMENT      Family history: Family History  Problem Relation Age of Onset  . Coronary artery disease      positive family hx of    Social history:  Social History   Social History  . Marital status: Married    Spouse name: N/A  . Number of children: N/A  . Years of education: N/A   Occupational  History  . full time [employer] account executive   Social History Main Topics  . Smoking status: Current Every Day Smoker    Types: Cigars  . Smokeless tobacco: Not on file  . Alcohol use No  . Drug use: No  . Sexual activity: Not on file   Other Topics Concern  . Not on file   Social History Narrative  . No narrative on file    Current outpatient meds: No outpatient prescriptions have been marked as taking for the 05/12/16 encounter [facility] Encounter).    Current inpatient meds:  Current Facility-Administered Medications  Medication Dose Route Frequency Provider Last Rate Last Dose  . HYDROcodone-acetaminophen (NORCO/VICODIN) 5-325 MG per tablet 1 tablet  1 tablet Oral Once Levada Schilling, MD       Current Outpatient Prescriptions  Medication Sig Dispense Refill  . aspirin 81 MG tablet Take 81 mg by mouth daily.      . clonazePAM (KLONOPIN) 1 MG tablet Take 1 mg by mouth 2 (two) times daily as needed for anxiety.     . furosemide (LASIX) 40 MG tablet Take 40 mg by mouth 2 (two) times daily.      Marland Kitchen HYDROcodone-acetaminophen (NORCO/VICODIN) 5-325 MG tablet Take 1 tablet by mouth every 8 (eight) hours as needed for moderate pain.   0  . isosorbide mononitrate (IMDUR) 30 MG 24 hr tablet Take 1 tablet (30 mg total) by mouth  daily. 90 tablet 3  . meloxicam (MOBIC) 7.5 MG tablet Take 1 tablet (7.5 mg total) by mouth daily. (Patient taking differently: Take 7.5 mg by mouth 2 (two) times daily. ) 7 tablet 0  . metFORMIN (GLUCOPHAGE) 500 MG tablet Take 500 mg by mouth 2 (two) times daily.     . metoprolol (LOPRESSOR) 50 MG tablet TAKE 1 TABLET BY MOUTH TWICE DAILY 60 tablet 11  . nitroGLYCERIN (NITROSTAT) 0.4 MG SL tablet Place 1 tablet (0.4 mg total) under the tongue every 5 (five) minutes as needed for chest pain. 25 tablet 3  . pantoprazole (PROTONIX) 40 MG tablet Take 1 tablet (40 mg total) by mouth daily. 30 tablet 1  . ramipril (ALTACE) 2.5 MG tablet Take 2.5 mg by mouth daily.      .  simvastatin (ZOCOR) 40 MG tablet TAKE 1 TABLET BY MOUTH AT BEDTIME 90 tablet 3  . sitaGLIPtin (JANUVIA) 100 MG tablet Take 100 mg by mouth every evening.     . traMADol (ULTRAM) 50 MG tablet Take 1 tablet by mouth every 8 (eight) hours as needed for moderate pain.       Allergies: Allergies  Allergen Reactions  . Penicillins Other (See Comments)    Has patient had a PCN reaction causing immediate rash, facial/tongue/throat swelling, SOB or lightheadedness with hypotension: Unknown Has patient had a PCN reaction causing severe rash involving mucus membranes or skin necrosis: Unknown Has patient had a PCN reaction that required hospitalization: Unknown Has patient had a PCN reaction occurring within the last 10 years: No If all of the above answers are "NO", then may proceed with Cephalosporin use.     ROS: As per HPI. A full 14-point review of systems was performed and is otherwise notable for pain in the left arm and both knees. He is otherwise without acute complaints.  PE:  BP 122/77   Pulse 85   Temp 97.8 F (36.6 C) (Oral)   Resp 16   Wt 107.7 kg (237 lb 7 oz)   SpO2 100%   BMI 36.10 kg/m   General: WDWN, no acute distress. He is oriented to self and hospital. Speech mildly dysarthric. No aphasia. Follows commands briskly. Affect is irritable. He appears to be confabulating at times. He is somewhat tangential. He has a mild stutter. HEENT: Normocephalic. Neck supple without LAD. MMM, OP clear. Sclerae anicteric. No conjunctival injection.  CV: Regular, distant, no murmur. Carotid pulses full and symmetric, no bruits. Distal pulses 2+ and symmetric.  Lungs: CTAB.  Abdomen: Soft, obese non-distended, non-tender. Bowel sounds present x4.  Extremities: No cyanosis or clubbing. He has edema in both lower extremities, greater on the left than the right. Neuro:  CN: Pupils are equal and round. They are symmetrically reactive from 3-->2 mm. visual fields are full to confrontation.  EOMI show breakup of smooth pursuits without nystagmus. No reported diplopia. Facial sensation is intact to light touch. Face is symmetric at rest with normal strength and mobility. Hearing is intact to conversational voice. Palate elevates symmetrically and uvula is midline. Voice is normal in tone, pitch and quality. Bilateral SCM and trapezii are 5/5. Tongue is midline with normal bulk and mobility.  Motor: Normal bulk, tone, and strength. He has some give way weakness in both legs and in the left arm because of pain. No tremor or other abnormal movements. No drift.  Sensation: Intact to light touch. DTRs: 2+, symmetric in the arms and knees, trace at both ankles. Toes downgoing  bilaterally. No pathologic reflexes.  Coordination: Finger-to-nose and heel-to-shin are without dysmetria though pain limits motion on the left side. Finger taps are normal in amplitude and speed, no decrement.   Labs:  Lab Results  Component Value Date   WBC 9.9 05/12/2016   HGB 14.3 05/12/2016   HCT 42.0 05/12/2016   PLT 214 05/12/2016   GLUCOSE 139 (H) 05/12/2016   ALT 22 05/12/2016   AST 28 05/12/2016   NA 138 05/12/2016   K 4.1 05/12/2016   CL 103 05/12/2016   CREATININE 0.90 05/12/2016   BUN 11 05/12/2016   CO2 22 05/12/2016   INR 0.99 05/12/2016   HGBA1C (H) 09/01/2010    7.9 (NOTE)                                                                       According to the ADA Clinical Practice Recommendations for 2011, when HbA1c is used as a screening test:   >=6.5%   Diagnostic of Diabetes Mellitus           (if abnormal result  is confirmed)  5.7-6.4%   Increased risk of developing Diabetes Mellitus  References:Diagnosis and Classification of Diabetes Mellitus,Diabetes [id number] 1):S62-S69 and Standards of Medical Care in         Diabetes - 2011,Diabetes Care,2011,34  (Suppl 1):S11-S61.    Imaging:  I have personally and independently reviewed the CT scan of the head without contrast from  today. There is a focal area of hyperdensity in the left basal ganglia abutting the adjacent internal capsule, suggestive of a lacunar infarct of indeterminate age, though likely chronic. There are scattered hypodensities within the bihemispheric white white matter suggestive of chronic small vessel ischemic change.   Assessment and Plan:  1. Possible ischemic stroke: By report, the patient had some facial droop and slurred speech that has improved by the time of my evaluation. However, his family reports that his speech is not quite back to his baseline. A small stroke is therefore possible. However, this could also represent a global encephalopathic process given at the coexisting confusion that he demonstrates on examination. He has clear risk factors for cerebrovascular disease including hyperlipidemia, hypertension, history of coronary artery disease, obesity, and evidence of small vessel changes and lacunar infarction on CT scan. At this point, I recommend additional evaluation with MRI scan of the brain, carotid Dopplers, transthoracic echocardiogram, fasting lipid panel, and hemoglobin A1c. He is on aspirin 81 mg daily at home. I will continue this for now but if testing does indicate evidence of cerebral ischemia this will need to be changed to either higher dose aspirin 325 mg or possibly Plavix 75 mg daily for secondary prevention. Continue simvastatin. Ensure tight control of glucose. Allow permissive hypertension for now pending further evaluation with imaging.  2. Encephalopathy: He is clearly confused on his examination today, disoriented and talking about things that don't make sense to the situation. This is a definite step away from his baseline according to his family at the bedside. This is entirely nonspecific and could be seen with any number of acute issues including metabolic derangements, infection such as UTI, or other acute medical problems. Recommend workup for cerebral ischemia as  outlined above. Will defer  additional testing to admitting team. Optimize metabolic status. Avoid sedating medications, specifically benzodiazepines, opiates, and anything with strong anticholinergic properties.  This was discussed with the patient's family members at the bedside. They are in agreement with the plan as stated. There were given the opportunity ask any questions which were addressed to their satisfaction.

## 2016-05-12 NOTE — ED Notes (Signed)
Patient and patient's wife both verbalized understanding of discharge instructions and deny any further needs or questions at this time. VS stable. Patient ambulatory with steady gait, RN escorted patient to ED entrance in wheelchair.

## 2016-05-12 NOTE — ED Provider Notes (Signed)
[facility] DEPT Provider Note   CSN: [id number] Arrival date & time: 05/12/16  1314  First Provider Contact:  First MD Initiated Contact with Patient 05/12/16 1347        History   Chief Complaint Chief Complaint  Patient presents with  . Stroke Symptoms    HPI Vincent [name] is a 65 y.o. male.  HPI Patient presents for evaluation of speech change and was made code stroke.  The family reports he had acute onset of quick speech that was not intelligible today.  He also has been having some left arm weakness for 2 days, and reports being seen by his PCP and told it was arthritis.  There is pain associated with the weakness.  No recent falls.  No recent fevers, chills, illnesses.  Past Medical History:  Diagnosis Date  . Acute MI ([place])   . Coronary artery disease   . Diverticulosis    mild  . Hemorrhoids   . Hx of colonic polyps   . Hyperlipidemia   . Hypertension   . Left ventricular dysfunction    hx of    Patient Active Problem List   Diagnosis Date Noted  . OSA (obstructive sleep apnea) 11/29/2011  . Syncope 07/24/2011  . ARM NUMBNESS 06/19/2010  . EDEMA 03/13/2010  . FASTING HYPERGLYCEMIA 03/13/2010  . CORONARY ATHEROSCLEROSIS NATIVE CORONARY ARTERY 06/20/2009  . COLONIC POLYPS, ADENOMATOUS 01/31/2009  . HYPERLIPIDEMIA-MIXED 01/31/2009  . Hypertension 01/31/2009  . CAD, UNSPECIFIED SITE 01/31/2009  . HEMORRHOIDS 01/31/2009  . DIVERTICULOSIS, MILD 01/31/2009    Past Surgical History:  Procedure Laterality Date  . ANGIOPLASTY     and bare metal stent placement  . CORONARY ANGIOPLASTY WITH STENT PLACEMENT         Home Medications    Prior to Admission medications   Medication Sig Start Date End Date Taking? Authorizing Provider  aspirin 81 MG tablet Take 81 mg by mouth daily.      Historical Provider, MD  clonazePAM (KLONOPIN) 1 MG tablet Take 1 mg by mouth 2 (two) times daily as needed for anxiety.     Historical Provider, MD  furosemide (LASIX)  40 MG tablet Take 40 mg by mouth 2 (two) times daily.      Historical Provider, MD  HYDROcodone-acetaminophen (NORCO/VICODIN) 5-325 MG tablet Take 1 tablet by mouth every 8 (eight) hours as needed for moderate pain.  12/05/15   Historical Provider, MD  isosorbide mononitrate (IMDUR) 30 MG 24 hr tablet Take 1 tablet (30 mg total) by mouth daily. 09/06/15   Burnell Blanks, MD  meloxicam (MOBIC) 7.5 MG tablet Take 1 tablet (7.5 mg total) by mouth daily. Patient taking differently: Take 7.5 mg by mouth 2 (two) times daily.  03/20/14   April Palumbo, MD  metFORMIN (GLUCOPHAGE) 500 MG tablet Take 500 mg by mouth 2 (two) times daily.     Historical Provider, MD  metoprolol (LOPRESSOR) 50 MG tablet TAKE 1 TABLET BY MOUTH TWICE DAILY 10/05/15   Burnell Blanks, MD  nitroGLYCERIN (NITROSTAT) 0.4 MG SL tablet Place 1 tablet (0.4 mg total) under the tongue every 5 (five) minutes as needed for chest pain. 09/06/15   Burnell Blanks, MD  pantoprazole (PROTONIX) 40 MG tablet Take 1 tablet (40 mg total) by mouth daily. 01/06/14   Gregor Hams, MD  ramipril (ALTACE) 2.5 MG tablet Take 2.5 mg by mouth daily.      Historical Provider, MD  simvastatin (ZOCOR) 40 MG tablet TAKE 1  TABLET BY MOUTH AT BEDTIME 09/07/15   Burnell Blanks, MD  sitaGLIPtin (JANUVIA) 100 MG tablet Take 100 mg by mouth every evening.     Historical Provider, MD  traMADol (ULTRAM) 50 MG tablet Take 1 tablet by mouth every 8 (eight) hours as needed for moderate pain.  01/01/14   Historical Provider, MD    Family History Family History  Problem Relation Age of Onset  . Coronary artery disease      positive family hx of    Social History Social History  Substance Use Topics  . Smoking status: Current Every Day Smoker    Types: Cigars  . Smokeless tobacco: Not on file  . Alcohol use No     Allergies   Penicillins   Review of Systems Review of Systems  Constitutional: Negative for chills and fever.  HENT:  Negative for ear pain and sore throat.   Eyes: Negative for pain and visual disturbance.  Respiratory: Negative for cough and shortness of breath.   Cardiovascular: Negative for chest pain and palpitations.  Gastrointestinal: Negative for abdominal pain and vomiting.  Genitourinary: Negative for dysuria and hematuria.  Musculoskeletal: Negative for arthralgias and back pain.  Skin: Negative for color change and rash.  Neurological: Positive for speech difficulty and weakness. Negative for seizures and syncope.  All other systems reviewed and are negative.    Physical Exam Updated Vital Signs BP 122/77   Pulse 85   Temp 97.8 F (36.6 C) (Oral)   Resp 16   Wt 107.7 kg   SpO2 100%   BMI 36.10 kg/m   Physical Exam  Constitutional: He appears well-developed and well-nourished.  HENT:  Head: Normocephalic and atraumatic.  Eyes: Conjunctivae are normal.  Neck: Neck supple.  Cardiovascular: Normal rate and regular rhythm.   No murmur heard. Pulmonary/Chest: Effort normal and breath sounds normal. No respiratory distress.  Abdominal: Soft. There is no tenderness.  Musculoskeletal: He exhibits no edema.  Neurological: He is alert.  CN II-XII grossly intact Eyes: PERRL, EOMI Bilateral UE strength 5/5 Bilateral LE strength 5/5    Skin: Skin is warm and dry.  Psychiatric: He has a normal mood and affect. His speech is rapid and/or pressured.  Nursing note and vitals reviewed.    ED Treatments / Results  Labs (all labs ordered are listed, but only abnormal results are displayed) Labs Reviewed  COMPREHENSIVE METABOLIC PANEL - Abnormal; Notable for the following:       Result Value   Glucose, Bld 140 (*)    All other components within normal limits  CBG MONITORING, ED - Abnormal; Notable for the following:    Glucose-Capillary 157 (*)    All other components within normal limits  I-STAT CHEM 8, ED - Abnormal; Notable for the following:    Glucose, Bld 139 (*)    All  other components within normal limits  PROTIME-INR  APTT  CBC  DIFFERENTIAL  AMMONIA  URINALYSIS, ROUTINE W REFLEX MICROSCOPIC (NOT AT [facility])  Randolm Idol, ED  I-STAT VENOUS BLOOD GAS, ED    EKG  EKG Interpretation  Date/Time:  Saturday May 12 2016 13:51:21 EDT Ventricular Rate:  82 PR Interval:    QRS Duration: 108 QT Interval:  361 QTC Calculation: 422 R Axis:   10 Text Interpretation:  Sinus rhythm Low voltage, precordial leads Abnormal R-wave progression, early transition Borderline T abnormalities, inferior leads Baseline wander in lead(s) V1 No significant change since last tracing Confirmed by ZACKOWSKI  MD, SCOTT (  [id number]) on 05/12/2016 3:31:30 PM       Radiology Ct Head Code Stroke W/o Cm  Result Date: 05/12/2016 CLINICAL DATA:  Code stroke.  Aphasia.  Confusion. EXAM: CT HEAD WITHOUT CONTRAST TECHNIQUE: Contiguous axial images were obtained from the base of the skull through the vertex without intravenous contrast. COMPARISON:  09/01/2010 FINDINGS: There is no evidence of acute cortical infarct, intracranial hemorrhage, mass, midline shift, or extra-axial fluid collection. Ventricles and sulci are normal. A chronic lacunar infarct is again seen involving the left lentiform nucleus. There is a small chronic infarct inferolaterally in the left cerebellum which appears new. Patchy hypodensities throughout the cerebral white matter have progressed from the prior study and are nonspecific but compatible with moderate chronic small vessel ischemic disease. Orbits are unremarkable. The visualized paranasal sinuses and mastoid air cells are clear. No acute osseous abnormality is identified. Calcified atherosclerosis is noted at the skullbase. ASPECTS [facility] Stroke Program Early CT Score, http://www.aspectsinstroke.com) - Ganglionic level infarction (caudate, lentiform nuclei, internal capsule, insula, M1-M3 cortex): 7 - Supraganglionic infarction (M4-M6 cortex): 3 Total score (0-10  with 10 being normal): 10 IMPRESSION: 1. No evidence of acute intracranial abnormality. 2. ASPECTS score 10. 3. Progressive, moderate chronic small vessel ischemic disease. New chronic left cerebellar infarct appear Stroke service paged at 2 pm. These results were called by telephone on 05/12/2016 at 2:15 pm to Dr. Rogene [place], who verbally acknowledged these results. Electronically Signed   By: Logan Bores M.D.   On: 05/12/2016 14:17    Procedures Procedures (including critical care time)  Medications Ordered in ED Medications  HYDROcodone-acetaminophen (NORCO/VICODIN) 5-325 MG per tablet 1 tablet (not administered)  LORazepam (ATIVAN) tablet 1 mg (1 mg Oral Given 05/12/16 1459)     Initial Impression / Assessment and Plan / ED Course  I have reviewed the triage vital signs and the nursing notes.  Pertinent labs & imaging results that were available during my care of the patient were reviewed by me and considered in my medical decision making (see chart for details).  Clinical Course    Patient presented with quick speech, difficulty speaking, was made a code stroke. Patient was evaluated by neurology, and had a CT scan which was unremarkable. Neurology did not think that this was a CVA or TIA, and recommended workup for encephalopathy. Patient's mental status continued to improve here without intervention. Workup, including labs were unremarkable. Patient has no fevers, symptoms, urinary symptoms was just infection including pneumonia or UTI or encephalopathy. Patient given Ativan here, he does take benzodiazepines at home, and had significant improvement in his quick speech. On my reassessment patient was at normal mental status, with normal mental exam, was at his baseline.  Do not feel patient is suffering from an acute emergent condition. We'll discharge with recommendation for PCP follow-up.  We have discussed the discharge plan, including the plan for outpatient followup, and strict return  precautions, including those that would require calling 911.     Final Clinical Impressions(s) / ED Diagnoses   Final diagnoses:  Hyperactivity    New Prescriptions New Prescriptions   No medications on file     Levada Schilling, MD 05/12/16 1535

## 2016-06-01 ENCOUNTER — Telehealth: Payer: Self-pay | Admitting: Cardiovascular Disease

## 2016-06-01 NOTE — Telephone Encounter (Signed)
Walk in pt form-[facility]-clearance dropped off-Pat/[place] back Tuesday 8/29 will give to them then.

## 2016-06-05 NOTE — Telephone Encounter (Signed)
Pt will need appt prior to being cleared. I spoke with pt's wife and made appt for him to see Dr. Angelena Form on 06/27/16 at 4:15.  I offered earlier appt with PA or NP but she wanted pt to see Dr. Angelena Form.

## 2016-06-27 ENCOUNTER — Ambulatory Visit (INDEPENDENT_AMBULATORY_CARE_PROVIDER_SITE_OTHER): Payer: BLUE CROSS/BLUE SHIELD | Admitting: Cardiovascular Disease

## 2016-06-27 ENCOUNTER — Encounter: Payer: Self-pay | Admitting: Cardiovascular Disease

## 2016-06-27 VITALS — BP 110/74 | HR 76 | Ht 68.0 in | Wt 238.4 lb

## 2016-06-27 DIAGNOSIS — I10 Essential (primary) hypertension: Secondary | ICD-10-CM | POA: Diagnosis not present

## 2016-06-27 DIAGNOSIS — Z01810 Encounter for preprocedural cardiovascular examination: Secondary | ICD-10-CM | POA: Diagnosis not present

## 2016-06-27 DIAGNOSIS — I2510 Atherosclerotic heart disease of native coronary artery without angina pectoris: Secondary | ICD-10-CM

## 2016-06-27 DIAGNOSIS — E785 Hyperlipidemia, unspecified: Secondary | ICD-10-CM

## 2016-06-27 NOTE — Patient Instructions (Signed)
Medication Instructions:  Your physician recommends that you continue on your current medications as directed. Please refer to the Current Medication list given to you today.   Labwork: Your physician recommends that you return for lab work on day of stress test. --Lipid profile.  This is fasting   Testing/Procedures: Your physician has requested that you have a lexiscan myoview. For further information please visit [email or web address]. Please follow instruction sheet, as given.    Follow-Up: Your physician wants you to follow-up in: 12 months.  You will receive a reminder letter in the mail two months in advance. If you don't receive a letter, please call our office to schedule the follow-up appointment.   Any Other Special Instructions Will Be Listed Below (If Applicable).     If you need a refill on your cardiac medications before your next appointment, please call your pharmacy.

## 2016-06-27 NOTE — Progress Notes (Signed)
Chief Complaint  Patient presents with  . Knee Pain   History of Present Illness:  65 yo male with history of CAD, HTN, HLD who is here today for cardiac follow up. He has been followed in the past by Dr. Lia Foyer. He had an anterior MI in 2007 with stent placed in LAD at that time. Last cath 06/23/10 per Dr. Lia Foyer with 30-40% proximal LAD restenosis, 30% mid LAD, 40% mid Circumflex, no RCA disease, LVEF=45-50%. Stress myoview April 2015 with no ischemia. He was seen in the ED January 2016 with chest pain while upset. Workup negative. Started on Imdur. No chest pain since.   He is here today for follow up. He has been doing well. No chest pain or SOB.  No LE edema, dizziness, near syncope or syncope.He has right knee pain. He is hoping to have a right knee replacement soon.   Primary Care Physician:  Marton Redwood, MD   Past Medical History:  Diagnosis Date  . Acute MI ([place])   . Coronary artery disease   . Diverticulosis    mild  . Hemorrhoids   . Hx of colonic polyps   . Hyperlipidemia   . Hypertension   . Left ventricular dysfunction    hx of    Past Surgical History:  Procedure Laterality Date  . ANGIOPLASTY     and bare metal stent placement  . CORONARY ANGIOPLASTY WITH STENT PLACEMENT      Current Outpatient Prescriptions  Medication Sig Dispense Refill  . aspirin 81 MG tablet Take 81 mg by mouth daily.      . clonazePAM (KLONOPIN) 1 MG tablet Take 1 mg by mouth 2 (two) times daily.     Marland Kitchen FLUVIRIN SUSP Inject as directed once.  0  . furosemide (LASIX) 40 MG tablet Take 40 mg by mouth 2 (two) times daily.      Marland Kitchen HYDROcodone-acetaminophen (NORCO/VICODIN) 5-325 MG tablet Take 1 tablet by mouth every 8 (eight) hours as needed for moderate pain.   0  . isosorbide mononitrate (IMDUR) 30 MG 24 hr tablet Take 1 tablet (30 mg total) by mouth daily. 90 tablet 3  . loratadine-pseudoephedrine (CLARITIN-D 24-HOUR) 10-240 MG 24 hr tablet Take 1 tablet by mouth daily.    Marland Kitchen  LORazepam (ATIVAN) 1 MG tablet Take 1 mg by mouth every 8 (eight) hours as needed. AS NEEDED FOR ANXIETY  3  . meloxicam (MOBIC) 7.5 MG tablet Take 1 tablet (7.5 mg total) by mouth daily. (Patient taking differently: Take 7.5 mg by mouth 2 (two) times daily as needed for pain. ) 7 tablet 0  . metFORMIN (GLUCOPHAGE) 1000 MG tablet Take 1,000 mg by mouth 2 (two) times daily.    . metoprolol (LOPRESSOR) 50 MG tablet TAKE 1 TABLET BY MOUTH TWICE DAILY 60 tablet 11  . nitroGLYCERIN (NITROSTAT) 0.4 MG SL tablet Place 1 tablet (0.4 mg total) under the tongue every 5 (five) minutes as needed for chest pain. 25 tablet 3  . pantoprazole (PROTONIX) 40 MG tablet Take 1 tablet (40 mg total) by mouth daily. 30 tablet 1  . ramipril (ALTACE) 2.5 MG tablet Take 2.5 mg by mouth daily.      . simvastatin (ZOCOR) 40 MG tablet TAKE 1 TABLET BY MOUTH AT BEDTIME 90 tablet 3  . sitaGLIPtin (JANUVIA) 100 MG tablet Take 100 mg by mouth every evening.     . traMADol (ULTRAM) 50 MG tablet Take 1 tablet by mouth every 8 (eight) hours as  needed for moderate pain.      No current facility-administered medications for this visit.     Allergies  Allergen Reactions  . Penicillins Other (See Comments)    Has patient had a PCN reaction causing immediate rash, facial/tongue/throat swelling, SOB or lightheadedness with hypotension: Unknown Has patient had a PCN reaction causing severe rash involving mucus membranes or skin necrosis: Unknown Has patient had a PCN reaction that required hospitalization: Unknown Has patient had a PCN reaction occurring within the last 10 years: No If all of the above answers are "NO", then may proceed with Cephalosporin use.     Social History   Social History  . Marital status: Married    Spouse name: N/A  . Number of children: N/A  . Years of education: N/A   Occupational History  . full time [employer] account executive   Social History Main Topics  . Smoking status: Current Every Day Smoker     Types: Cigars  . Smokeless tobacco: Not on file  . Alcohol use No  . Drug use: No  . Sexual activity: Not on file   Other Topics Concern  . Not on file   Social History Narrative  . No narrative on file    Family History  Problem Relation Age of Onset  . Coronary artery disease      positive family hx of    Review of Systems:  As stated in the HPI and otherwise negative.   BP 110/74 (BP Location: Left Arm, Patient Position: Sitting, Cuff Size: Normal)   Pulse 76   Ht 5\' 8"  (1.727 m)   Wt 108.1 kg (238 lb 6.4 oz)   BMI 36.25 kg/m   Physical Examination: General: Well developed, well nourished, NAD  HEENT: OP clear, mucus membranes moist  SKIN: warm, dry. No rashes. Neuro: No focal deficits  Musculoskeletal: Muscle strength 5/5 all ext  Psychiatric: Mood and affect normal  Neck: No JVD, no carotid bruits, no thyromegaly, no lymphadenopathy.  Lungs:Clear bilaterally, no wheezes, rhonci, crackles Cardiovascular: Regular rate and rhythm. No murmurs, gallops or rubs. Abdomen:Soft. Bowel sounds present. Non-tender.  Extremities: No lower extremity edema. Pulses are 2 + in the bilateral DP/PT.  EKG:  EKG is ordered today. The ekg ordered today demonstrates Sinus, rate 79 bpm. PVC. Non-specific T wave abnormality.   Recent Labs: 05/12/2016: ALT 22; BUN 11; Creatinine, Ser 0.90; Hemoglobin 14.3; Platelets 214; Potassium 4.1; Sodium 138   Lipid Panel No results found for: CHOL, TRIG, HDL, CHOLHDL, VLDL, LDLCALC, LDLDIRECT   Wt Readings from Last 3 Encounters:  06/27/16 108.1 kg (238 lb 6.4 oz)  05/12/16 107.7 kg (237 lb 7 oz)  09/06/15 114.4 kg (252 lb 1.9 oz)     Other studies Reviewed: Additional studies/ records that were reviewed today include: . Review of the above records demonstrates:    Assessment and Plan:   1. CAD: He has had no recent chest pain worrisome for angina. Last cath in 2011 with mild CAD, patent stent LAD. Continue ASA, statin, Ace-inh, beta  blocker, Imdur. Stress test normal 2015. Will arrange repeat stress test wit upcoming knee surgery.   2. HTN: BP controlled. No changes.   3. HLD: He is on a statin. LFTs normal August 2017. Will check lipids. He is not sure if he has had this done in primary care in the last year.   4. Pre-operative cardiovascular examination: He is known to have CAD. He is doing well overall but activity if  limited by his knee pain. Will arrange Lexiscan nuclear stress test to exclude ischemia (note that last stress test in 2015 showed scar but no ischemia). Will provide further risk stratification for his surgery after his stress test.   Current medicines are reviewed at length with the patient today.  The patient does not have concerns regarding medicines.  The following changes have been made:  no change  Labs/ tests ordered today include:   Orders Placed This Encounter  Procedures  . Lipid Profile  . Myocardial Perfusion Imaging  . EKG 12-Lead     Disposition:   FU with me in 12  months   Signed, Lauree Chandler, MD 06/27/2016 4:49 PM    [facility] [place], [place], [place]  [address] Phone: [phone]; Fax: [phone]

## 2016-06-28 ENCOUNTER — Telehealth (HOSPITAL_COMMUNITY): Payer: Self-pay | Admitting: *Deleted

## 2016-06-28 NOTE — Telephone Encounter (Signed)
Patient given detailed instructions per Myocardial Perfusion Study Information Sheet for the test on 07/02/16. Patient notified to arrive 15 minutes early and that it is imperative to arrive on time for appointment to keep from having the test rescheduled.  If you need to cancel or reschedule your appointment, please call the office within 24 hours of your appointment. Failure to do so may result in a cancellation of your appointment, and a $50 no show fee. Patient verbalized understanding. Lyne Khurana Jacqueline    

## 2016-06-28 NOTE — Telephone Encounter (Signed)
Left message with wife for patient to call back in reference to upcoming appointment scheduled for 07/02/16 Phone number given for a call back so details instructions can be given. Kirstie Peri

## 2016-07-02 ENCOUNTER — Ambulatory Visit (HOSPITAL_COMMUNITY): Payer: BLUE CROSS/BLUE SHIELD | Attending: Cardiovascular Disease

## 2016-07-02 ENCOUNTER — Encounter: Payer: Self-pay | Admitting: Cardiovascular Disease

## 2016-07-02 ENCOUNTER — Ambulatory Visit: Payer: BLUE CROSS/BLUE SHIELD | Admitting: *Deleted

## 2016-07-02 ENCOUNTER — Encounter (INDEPENDENT_AMBULATORY_CARE_PROVIDER_SITE_OTHER): Payer: Self-pay

## 2016-07-02 DIAGNOSIS — Z01818 Encounter for other preprocedural examination: Secondary | ICD-10-CM | POA: Diagnosis present

## 2016-07-02 DIAGNOSIS — R0602 Shortness of breath: Secondary | ICD-10-CM | POA: Insufficient documentation

## 2016-07-02 DIAGNOSIS — R0789 Other chest pain: Secondary | ICD-10-CM | POA: Diagnosis not present

## 2016-07-02 DIAGNOSIS — R42 Dizziness and giddiness: Secondary | ICD-10-CM | POA: Diagnosis not present

## 2016-07-02 DIAGNOSIS — E785 Hyperlipidemia, unspecified: Secondary | ICD-10-CM

## 2016-07-02 DIAGNOSIS — I252 Old myocardial infarction: Secondary | ICD-10-CM | POA: Insufficient documentation

## 2016-07-02 DIAGNOSIS — I2510 Atherosclerotic heart disease of native coronary artery without angina pectoris: Secondary | ICD-10-CM

## 2016-07-02 LAB — MYOCARDIAL PERFUSION IMAGING
LV dias vol: 142 mL (ref 62–150)
LV sys vol: 90 mL
Peak HR: 86 {beats}/min
RATE: 0.35
Rest HR: 67 {beats}/min
SDS: 4
SRS: 10
SSS: 14
TID: 1.14

## 2016-07-02 LAB — LIPID PANEL
Cholesterol: 115 mg/dL — ABNORMAL LOW (ref 125–200)
HDL: 47 mg/dL (ref >=40)
LDL Cholesterol: 54 mg/dL (ref <130)
Total CHOL/HDL Ratio: 2.4 Ratio (ref <=5.0)
Triglycerides: 68 mg/dL (ref <150)
VLDL: 14 mg/dL (ref <30)

## 2016-07-02 MED ORDER — TECHNETIUM TC 99M TETROFOSMIN IV KIT
10.1000 | PACK | Freq: Once | INTRAVENOUS | Status: AC | PRN
  Administered 2016-07-02: 10.1 via INTRAVENOUS
  Filled 2016-07-02: qty 10

## 2016-07-02 MED ORDER — TECHNETIUM TC 99M TETROFOSMIN IV KIT
32.6000 | PACK | Freq: Once | INTRAVENOUS | Status: AC | PRN
  Administered 2016-07-02: 33 via INTRAVENOUS
  Filled 2016-07-02: qty 33

## 2016-07-02 MED ORDER — AMINOPHYLLINE 25 MG/ML IV SOLN
75.0000 mg | Freq: Once | INTRAVENOUS | Status: AC
  Administered 2016-07-02: 75 mg via INTRAVENOUS

## 2016-07-02 MED ORDER — REGADENOSON 0.4 MG/5ML IV SOLN
0.4000 mg | Freq: Once | INTRAVENOUS | Status: AC
  Administered 2016-07-02: 0.4 mg via INTRAVENOUS

## 2016-07-03 ENCOUNTER — Telehealth: Payer: Self-pay | Admitting: Cardiovascular Disease

## 2016-07-03 NOTE — Telephone Encounter (Signed)
Notes Recorded by Burnell Blanks, MD on 07/02/2016 at 5:40 PM EDT Lipids are ok. Also see his stress test report. cdm   Notes Recorded by Burnell Blanks, MD on 07/02/2016 at 5:40 PM EDT Stress test is unchanged. Letter of clearance is written. cdm  Informed Pat (on DPR) and the pt of his lab and stress test results per Dr Angelena Form, as mentioned above.  Informed both parties that Laney Potash, faxed the pts clearance letter to his Surgeon's office yesterday.  Both parties request for a copy of this letter to be mailed to their current mailing address.  Confirmed address with the pt. Pt verbalized understanding and gracious for the prompt follow-up.

## 2016-07-03 NOTE — Telephone Encounter (Signed)
New Message  Pt voiced she is returning Pats call from yesterday about results.  Please f/u with pt

## 2016-07-18 ENCOUNTER — Encounter (HOSPITAL_COMMUNITY): Payer: Self-pay | Admitting: *Deleted

## 2016-07-18 NOTE — Progress Notes (Signed)
Need orders in EPIC.  Surgery on 08/09/16. preop on 08/02/16.  Thank You

## 2016-07-19 ENCOUNTER — Ambulatory Visit: Payer: Self-pay | Admitting: Orthopedic Surgery

## 2016-07-26 ENCOUNTER — Ambulatory Visit: Payer: Self-pay | Admitting: Orthopedic Surgery

## 2016-07-26 NOTE — H&P (Signed)
TOTAL KNEE ADMISSION H&P  Patient is being admitted for right total knee arthroplasty.  Subjective:  Chief Complaint:right knee pain.  HPI: Vincent [name], 65 y.o. male, has a history of pain and functional disability in the right knee due to arthritis and has failed non-surgical conservative treatments for greater than 12 weeks to includeNSAID's and/or analgesics, corticosteriod injections, flexibility and strengthening excercises, use of assistive devices, weight reduction as appropriate and activity modification.  Onset of symptoms was gradual, starting >10 years ago with gradually worsening course since that time. The patient noted no past surgery on the right knee(s).  Patient currently rates pain in the right knee(s) at 10 out of 10 with activity. Patient has night pain, worsening of pain with activity and weight bearing, pain that interferes with activities of daily living, pain with passive range of motion, crepitus and joint swelling.  Patient has evidence of subchondral cysts, subchondral sclerosis, periarticular osteophytes and joint space narrowing by imaging studies. There is no active infection.  Patient Active Problem List   Diagnosis Date Noted  . OSA (obstructive sleep apnea) 11/29/2011  . Syncope 07/24/2011  . ARM NUMBNESS 06/19/2010  . EDEMA 03/13/2010  . FASTING HYPERGLYCEMIA 03/13/2010  . CORONARY ATHEROSCLEROSIS NATIVE CORONARY ARTERY 06/20/2009  . COLONIC POLYPS, ADENOMATOUS 01/31/2009  . HYPERLIPIDEMIA-MIXED 01/31/2009  . Hypertension 01/31/2009  . CAD, UNSPECIFIED SITE 01/31/2009  . HEMORRHOIDS 01/31/2009  . DIVERTICULOSIS, MILD 01/31/2009   Past Medical History:  Diagnosis Date  . Acute MI   . Coronary artery disease   . Diverticulosis    mild  . Hemorrhoids   . Hx of colonic polyps   . Hyperlipidemia   . Hypertension   . Left ventricular dysfunction    hx of    Past Surgical History:  Procedure Laterality Date  . ANGIOPLASTY     and bare metal stent  placement  . CORONARY ANGIOPLASTY WITH STENT PLACEMENT       (Not in a hospital admission) Allergies  Allergen Reactions  . Penicillins Other (See Comments)    Has patient had a PCN reaction causing immediate rash, facial/tongue/throat swelling, SOB or lightheadedness with hypotension: Unknown Has patient had a PCN reaction causing severe rash involving mucus membranes or skin necrosis: Unknown Has patient had a PCN reaction that required hospitalization: Unknown Has patient had a PCN reaction occurring within the last 10 years: No If all of the above answers are "NO", then may proceed with Cephalosporin use.     Social History  Substance Use Topics  . Smoking status: Current Every Day Smoker    Types: Cigars  . Smokeless tobacco: Not on file  . Alcohol use No    Family History  Problem Relation Age of Onset  . Coronary artery disease      positive family hx of     Review of Systems  Constitutional: Negative.   HENT: Negative.   Eyes: Positive for blurred vision.  Respiratory: Negative.   Cardiovascular: Negative.   Gastrointestinal: Negative.   Genitourinary: Negative.   Musculoskeletal: Positive for back pain and joint pain.  Skin: Negative.   Neurological: Positive for dizziness.  Endo/Heme/Allergies: Negative.   Psychiatric/Behavioral: Negative.     Objective:  Physical Exam  Vitals reviewed. Constitutional: He is oriented to person, place, and time. He appears well-nourished.  HENT:  Head: Normocephalic and atraumatic.  Eyes: Conjunctivae and EOM are normal. Pupils are equal, round, and reactive to light.  Neck: Normal range of motion. Neck supple.  Cardiovascular:  Normal rate, regular rhythm and intact distal pulses.   Respiratory: No respiratory distress.  GI: He exhibits no distension.  Genitourinary:  Genitourinary Comments: deferred  Musculoskeletal:       Right knee: He exhibits decreased range of motion, swelling, effusion and abnormal alignment.  Tenderness found. Medial joint line and lateral joint line tenderness noted.  Neurological: He is alert and oriented to person, place, and time. He has normal reflexes.  Skin: Skin is warm and dry.  Psychiatric: His behavior is normal. Thought content normal.    Vital signs in last 24 hours: @VSRANGES @  Labs:   Estimated body mass index is 36.25 kg/m as calculated from the following:   Height as of 06/27/16: 5\' 8"  (1.727 m).   Weight as of 06/27/16: 108.1 kg (238 lb 6.4 oz).   Imaging Review Plain radiographs demonstrate severe degenerative joint disease of the right knee(s). The overall alignment ismild varus. The bone quality appears to be adequate for age and reported activity level.  Assessment/Plan:  End stage arthritis, right knee   The patient history, physical examination, clinical judgment of the provider and imaging studies are consistent with end stage degenerative joint disease of the right knee(s) and total knee arthroplasty is deemed medically necessary. The treatment options including medical management, injection therapy arthroscopy and arthroplasty were discussed at length. The risks and benefits of total knee arthroplasty were presented and reviewed. The risks due to aseptic loosening, infection, stiffness, patella tracking problems, thromboembolic complications and other imponderables were discussed. The patient acknowledged the explanation, agreed to proceed with the plan and consent was signed. Patient is being admitted for inpatient treatment for surgery, pain control, PT, OT, prophylactic antibiotics, VTE prophylaxis, progressive ambulation and ADL's and discharge planning. The patient is planning to be discharged home with home health services

## 2016-08-01 ENCOUNTER — Encounter (HOSPITAL_COMMUNITY): Payer: Self-pay

## 2016-08-01 NOTE — Patient Instructions (Addendum)
KEYONE SCHRAGE  08/01/2016   Your procedure is scheduled on: 08/09/16  Report to [facility]  Entrance take [facility]  elevators to 3rd floor to  [place] at  1:15 PM  Call this number if you have problems the morning of surgery [phone]   Remember: ONLY 1 PERSON MAY GO WITH YOU TO SHORT STAY TO GET  READY MORNING OF YOUR SURGERY.  Do not eat food  :After Midnight.--MAY HAVE CLEAR LIQUIDS MORNING OF SURGERY UNTIL 0945 AM--THEN NOTHING     Take these medicines the morning of surgery with A SIP OF WATER: METOPROLOL, Iosorbide May take  Lorazepam, Nitroglycerin  If needed DO NOT TAKE ANY DIABETIC MEDICATIONS DAY OF YOUR SURGERY                               You may not have any metal on your body including hair pins and              piercings  Do not wear jewelry, make-up, lotions, powders or perfumes, deodorant             Do not wear nail polish.  Do not shave  48 hours prior to surgery.              Men may shave face and neck.   Do not bring valuables to the hospital. [place].  Contacts, dentures or bridgework may not be worn into surgery.  Leave suitcase in the car. After surgery it may be brought to your room.                Please read over the following fact sheets you were given: _____________________________________________________________________             [facility] - Preparing for Surgery Before surgery, you can play an important role.  Because skin is not sterile, your skin needs to be as free of germs as possible.  You can reduce the number of germs on your skin by washing with CHG (chlorahexidine gluconate) soap before surgery.  CHG is an antiseptic cleaner which kills germs and bonds with the skin to continue killing germs even after washing. Please DO NOT use if you have an allergy to CHG or antibacterial soaps.  If your skin becomes reddened/irritated stop using the CHG and  inform your nurse when you arrive at Short Stay. Do not shave (including legs and underarms) for at least 48 hours prior to the first CHG shower.  You may shave your face/neck. Please follow these instructions carefully:  1.  Shower with CHG Soap the night before surgery and the  morning of Surgery.  2.  If you choose to wash your hair, wash your hair first as usual with your  normal  shampoo.  3.  After you shampoo, rinse your hair and body thoroughly to remove the  shampoo.                           4.  Use CHG as you would any other liquid soap.  You can apply chg directly  to the skin and wash  Gently with a scrungie or clean washcloth.  5.  Apply the CHG Soap to your body ONLY FROM THE NECK DOWN.   Do not use on face/ open                           Wound or open sores. Avoid contact with eyes, ears mouth and genitals (private parts).                       Wash face,  Genitals (private parts) with your normal soap.             6.  Wash thoroughly, paying special attention to the area where your surgery  will be performed.  7.  Thoroughly rinse your body with warm water from the neck down.  8.  DO NOT shower/wash with your normal soap after using and rinsing off  the CHG Soap.                9.  Pat yourself dry with a clean towel.            10.  Wear clean pajamas.            11.  Place clean sheets on your bed the night of your first shower and do not  sleep with pets. Day of Surgery : Do not apply any lotions/deodorants the morning of surgery.  Please wear clean clothes to the hospital/surgery center.  FAILURE TO FOLLOW THESE INSTRUCTIONS MAY RESULT IN THE CANCELLATION OF YOUR SURGERY PATIENT SIGNATURE_________________________________  NURSE SIGNATURE__________________________________  ________________________________________________________________________ How to Manage Your Diabetes Before and After Surgery  Why is it important to control my blood sugar  before and after surgery? . Improving blood sugar levels before and after surgery helps healing and can limit problems. . A way of improving blood sugar control is eating a healthy diet by: o  Eating less sugar and carbohydrates o  Increasing activity/exercise o  Talking with your doctor about reaching your blood sugar goals . High blood sugars (greater than 180 mg/dL) can raise your risk of infections and slow your recovery, so you will need to focus on controlling your diabetes during the weeks before surgery. . Make sure that the doctor who takes care of your diabetes knows about your planned surgery including the date and location.  How do I manage my blood sugar before surgery? . Check your blood sugar at least 4 times a day, starting 2 days before surgery, to make sure that the level is not too high or low. o Check your blood sugar the morning of your surgery when you wake up and every 2 hours until you get to the Short Stay unit. . If your blood sugar is less than 70 mg/dL, you will need to treat for low blood sugar: o Do not take insulin. o Treat a low blood sugar (less than 70 mg/dL) with  cup of clear juice (cranberry or apple), 4 glucose tablets, OR glucose gel. o Recheck blood sugar in 15 minutes after treatment (to make sure it is greater than 70 mg/dL). If your blood sugar is not greater than 70 mg/dL on recheck, call [phone] for further instructions. . Report your blood sugar to the short stay nurse when you get to Short Stay.  . If you are admitted to the hospital after surgery: o Your blood sugar will be checked by the staff and you will probably be  given insulin after surgery (instead of oral diabetes medicines) to make sure you have good blood sugar levels. o The goal for blood sugar control after surgery is 80-180 mg/dL.   WHAT DO I DO ABOUT MY DIABETES MEDICATION?  Marland Kitchen Do not take oral diabetes medicines (pills) the morning of surgery.  .      . The day of  surgery, do not take other diabetes injectables, including Byetta (exenatide), Bydureon (exenatide ER), Victoza (liraglutide), or Trulicity (dulaglutide).  . If your CBG is greater than 220 mg/dL, you may take  of your sliding scale  . (correction) dose of insulin.    For patients with insulin pumps: Contact your diabetes doctor for specific instructions before surgery. Decrease basal rates by 20% at midnight the night before your surgery. Note that if your surgery is planned to be longer than 2 hours, your insulin pump will be removed and intravenous (IV) insulin will be started and managed by the nurses and the anesthesiologist. You will be able to restart your insulin pump once you are awake and able to manage it.  Make sure to bring insulin pump supplies to the hospital with you in case the  site needs to be changed.  Patient Signature:  Date:   Nurse Signature:  Date:   Reviewed and Endorsed by [facility] Patient Education Committee, August 2015   CLEAR LIQUID DIET   Foods Allowed                                                                     Foods Excluded  Coffee and tea, regular and decaf                             liquids that you cannot  Plain Jell-O in any flavor                                             see through such as: Fruit ices (not with fruit pulp)                                     milk, soups, orange juice  Iced Popsicles                                    All solid food Carbonated beverages, regular and diet                                    Cranberry, grape and apple juices Sports drinks like Gatorade Lightly seasoned clear broth or consume(fat free) Sugar, honey syrup  Sample Menu Breakfast                                Lunch  Supper Cranberry juice                    Beef broth                            Chicken broth Jell-O                                     Grape juice                           Apple  juice Coffee or tea                        Jell-O                                      Popsicle                                                Coffee or tea                        Coffee or tea  _____________________________________________________________________    Incentive Spirometer  An incentive spirometer is a tool that can help keep your lungs clear and active. This tool measures how well you are filling your lungs with each breath. Taking long deep breaths may help reverse or decrease the chance of developing breathing (pulmonary) problems (especially infection) following:  A long period of time when you are unable to move or be active. BEFORE THE PROCEDURE   If the spirometer includes an indicator to show your best effort, your nurse or respiratory therapist will set it to a desired goal.  If possible, sit up straight or lean slightly forward. Try not to slouch.  Hold the incentive spirometer in an upright position. INSTRUCTIONS FOR USE  1. Sit on the edge of your bed if possible, or sit up as far as you can in bed or on a chair. 2. Hold the incentive spirometer in an upright position. 3. Breathe out normally. 4. Place the mouthpiece in your mouth and seal your lips tightly around it. 5. Breathe in slowly and as deeply as possible, raising the piston or the ball toward the top of the column. 6. Hold your breath for 3-5 seconds or for as long as possible. Allow the piston or ball to fall to the bottom of the column. 7. Remove the mouthpiece from your mouth and breathe out normally. 8. Rest for a few seconds and repeat Steps 1 through 7 at least 10 times every 1-2 hours when you are awake. Take your time and take a few normal breaths between deep breaths. 9. The spirometer may include an indicator to show your best effort. Use the indicator as a goal to work toward during each repetition. 10. After each set of 10 deep breaths, practice coughing to be sure your lungs are clear.  If you have an incision (the cut made at the time of surgery), support your incision when coughing by placing a pillow or rolled up towels firmly  against it. Once you are able to get out of bed, walk around indoors and cough well. You may stop using the incentive spirometer when instructed by your caregiver.  RISKS AND COMPLICATIONS  Take your time so you do not get dizzy or light-headed.  If you are in pain, you may need to take or ask for pain medication before doing incentive spirometry. It is harder to take a deep breath if you are having pain. AFTER USE  Rest and breathe slowly and easily.  It can be helpful to keep track of a log of your progress. Your caregiver can provide you with a simple table to help with this. If you are using the spirometer at home, follow these instructions: [place] IF:   You are having difficultly using the spirometer.  You have trouble using the spirometer as often as instructed.  Your pain medication is not giving enough relief while using the spirometer.  You develop fever of 100.5 F (38.1 C) or higher. SEEK IMMEDIATE MEDICAL CARE IF:   You cough up bloody sputum that had not been present before.  You develop fever of 102 F (38.9 C) or greater.  You develop worsening pain at or near the incision site. MAKE SURE YOU:   Understand these instructions.  Will watch your condition.  Will get help right away if you are not doing well or get worse. Document Released: 02/04/2007 Document Revised: 12/17/2011 Document Reviewed: 04/07/2007 Exit[employer] Patient Information 2014 Exit[employer], [place].   ________________________________________________________________________  WHAT IS A BLOOD TRANSFUSION? Blood Transfusion Information  A transfusion is the replacement of blood or some of its parts. Blood is made up of multiple cells which provide different functions.  Red blood cells carry oxygen and are used for blood loss replacement.  White  blood cells fight against infection.  Platelets control bleeding.  Plasma helps clot blood.  Other blood products are available for specialized needs, such as hemophilia or other clotting disorders. BEFORE THE TRANSFUSION  Who gives blood for transfusions?   Healthy volunteers who are fully evaluated to make sure their blood is safe. This is blood bank blood. Transfusion therapy is the safest it has ever been in the practice of medicine. Before blood is taken from a donor, a complete history is taken to make sure that person has no history of diseases nor engages in risky social behavior (examples are intravenous drug use or sexual activity with multiple partners). The donor's travel history is screened to minimize risk of transmitting infections, such as malaria. The donated blood is tested for signs of infectious diseases, such as HIV and hepatitis. The blood is then tested to be sure it is compatible with you in order to minimize the chance of a transfusion reaction. If you or a relative donates blood, this is often done in anticipation of surgery and is not appropriate for emergency situations. It takes many days to process the donated blood. RISKS AND COMPLICATIONS Although transfusion therapy is very safe and saves many lives, the main dangers of transfusion include:   Getting an infectious disease.  Developing a transfusion reaction. This is an allergic reaction to something in the blood you were given. Every precaution is taken to prevent this. The decision to have a blood transfusion has been considered carefully by your caregiver before blood is given. Blood is not given unless the benefits outweigh the risks. AFTER THE TRANSFUSION  Right after receiving a blood transfusion, you will usually feel much better and more  energetic. This is especially true if your red blood cells have gotten low (anemic). The transfusion raises the level of the red blood cells which carry oxygen, and this  usually causes an energy increase.  The nurse administering the transfusion will monitor you carefully for complications. HOME CARE INSTRUCTIONS  No special instructions are needed after a transfusion. You may find your energy is better. Speak with your caregiver about any limitations on activity for underlying diseases you may have. SEEK MEDICAL CARE IF:   Your condition is not improving after your transfusion.  You develop redness or irritation at the intravenous (IV) site. SEEK IMMEDIATE MEDICAL CARE IF:  Any of the following symptoms occur over the next 12 hours:  Shaking chills.  You have a temperature by mouth above 102 F (38.9 C), not controlled by medicine.  Chest, back, or muscle pain.  People around you feel you are not acting correctly or are confused.  Shortness of breath or difficulty breathing.  Dizziness and fainting.  You get a rash or develop hives.  You have a decrease in urine output.  Your urine turns a dark color or changes to pink, red, or brown. Any of the following symptoms occur over the next 10 days:  You have a temperature by mouth above 102 F (38.9 C), not controlled by medicine.  Shortness of breath.  Weakness after normal activity.  The white part of the eye turns yellow (jaundice).  You have a decrease in the amount of urine or are urinating less often.  Your urine turns a dark color or changes to pink, red, or brown. Document Released: 09/21/2000 Document Revised: 12/17/2011 Document Reviewed: 05/10/2008 [facility] Patient Information 2014 [place], [place].  _______________________________________________________________________

## 2016-08-02 ENCOUNTER — Encounter (HOSPITAL_COMMUNITY): Payer: Self-pay

## 2016-08-02 ENCOUNTER — Encounter (HOSPITAL_COMMUNITY)
Admission: RE | Admit: 2016-08-02 | Discharge: 2016-08-02 | Disposition: A | Discharge status: Home / Self Care | Payer: BLUE CROSS/BLUE SHIELD | Source: Ambulatory Visit | Attending: Orthopedic Surgery | Admitting: Orthopedic Surgery

## 2016-08-02 DIAGNOSIS — E785 Hyperlipidemia, unspecified: Secondary | ICD-10-CM | POA: Diagnosis not present

## 2016-08-02 DIAGNOSIS — I10 Essential (primary) hypertension: Secondary | ICD-10-CM | POA: Diagnosis not present

## 2016-08-02 DIAGNOSIS — F17290 Nicotine dependence, other tobacco product, uncomplicated: Secondary | ICD-10-CM | POA: Insufficient documentation

## 2016-08-02 DIAGNOSIS — M1711 Unilateral primary osteoarthritis, right knee: Secondary | ICD-10-CM | POA: Diagnosis not present

## 2016-08-02 DIAGNOSIS — Z01812 Encounter for preprocedural laboratory examination: Secondary | ICD-10-CM | POA: Insufficient documentation

## 2016-08-02 DIAGNOSIS — G4733 Obstructive sleep apnea (adult) (pediatric): Secondary | ICD-10-CM | POA: Insufficient documentation

## 2016-08-02 DIAGNOSIS — I2510 Atherosclerotic heart disease of native coronary artery without angina pectoris: Secondary | ICD-10-CM | POA: Diagnosis not present

## 2016-08-02 HISTORY — DX: Unspecified hearing loss, unspecified ear: H91.90

## 2016-08-02 HISTORY — DX: Anxiety disorder, unspecified: F41.9

## 2016-08-02 HISTORY — DX: Type 2 diabetes mellitus without complications: E11.9

## 2016-08-02 LAB — CBC
HCT: 44.9 % (ref 39.0–52.0)
Hemoglobin: 14.9 g/dL (ref 13.0–17.0)
MCH: 30.5 pg (ref 26.0–34.0)
MCHC: 33.2 g/dL (ref 30.0–36.0)
MCV: 91.8 fL (ref 78.0–100.0)
Platelets: 239 10*3/uL (ref 150–400)
RBC: 4.89 MIL/uL (ref 4.22–5.81)
RDW: 13.6 % (ref 11.5–15.5)
WBC: 9.1 10*3/uL (ref 4.0–10.5)

## 2016-08-02 LAB — BASIC METABOLIC PANEL
Anion gap: 12 (ref 5–15)
BUN: 15 mg/dL (ref 6–20)
CO2: 24 mmol/L (ref 22–32)
Calcium: 9.9 mg/dL (ref 8.9–10.3)
Chloride: 102 mmol/L (ref 101–111)
Creatinine, Ser: 1.03 mg/dL (ref 0.61–1.24)
GFR calc Af Amer: >60 mL/min (ref >60)
GFR calc non Af Amer: >60 mL/min (ref >60)
Glucose, Bld: 151 mg/dL — ABNORMAL HIGH (ref 65–99)
Potassium: 4 mmol/L (ref 3.5–5.1)
Sodium: 138 mmol/L (ref 135–145)

## 2016-08-02 LAB — GLUCOSE, CAPILLARY: Glucose-Capillary: 155 mg/dL — ABNORMAL HIGH (ref 65–99)

## 2016-08-02 LAB — ABO/RH: ABO/RH(D): O NEG

## 2016-08-02 LAB — SURGICAL PCR SCREEN
MRSA, PCR: NEGATIVE
Staphylococcus aureus: NEGATIVE

## 2016-08-03 ENCOUNTER — Encounter (HOSPITAL_COMMUNITY): Payer: Self-pay

## 2016-08-03 LAB — HEMOGLOBIN A1C
Hgb A1c MFr Bld: 6.1 % — ABNORMAL HIGH (ref 4.8–5.6)
Mean Plasma Glucose: 128 mg/dL

## 2016-08-08 MED ORDER — VANCOMYCIN HCL 10 G IV SOLR
1500.0000 mg | INTRAVENOUS | Status: AC
  Administered 2016-08-09: 1500 mg via INTRAVENOUS
  Filled 2016-08-08 (×2): qty 1500

## 2016-08-09 ENCOUNTER — Inpatient Hospital Stay (HOSPITAL_COMMUNITY): Payer: BLUE CROSS/BLUE SHIELD | Admitting: Anesthesiology

## 2016-08-09 ENCOUNTER — Encounter (HOSPITAL_COMMUNITY)
Admission: RE | Disposition: A | Discharge status: Home Health | Payer: Self-pay | Source: Ambulatory Visit | Attending: Orthopedic Surgery

## 2016-08-09 ENCOUNTER — Inpatient Hospital Stay (HOSPITAL_COMMUNITY)
Admission: RE | Admit: 2016-08-09 | Discharge: 2016-08-13 | DRG: 470 | Disposition: A | Discharge status: Home Health | Payer: BLUE CROSS/BLUE SHIELD | Source: Ambulatory Visit | Attending: Orthopedic Surgery | Admitting: Orthopedic Surgery

## 2016-08-09 ENCOUNTER — Encounter (HOSPITAL_COMMUNITY): Payer: Self-pay

## 2016-08-09 ENCOUNTER — Inpatient Hospital Stay (HOSPITAL_COMMUNITY): Payer: BLUE CROSS/BLUE SHIELD

## 2016-08-09 DIAGNOSIS — Z955 Presence of coronary angioplasty implant and graft: Secondary | ICD-10-CM | POA: Diagnosis not present

## 2016-08-09 DIAGNOSIS — I2510 Atherosclerotic heart disease of native coronary artery without angina pectoris: Secondary | ICD-10-CM | POA: Diagnosis present

## 2016-08-09 DIAGNOSIS — R0602 Shortness of breath: Secondary | ICD-10-CM | POA: Diagnosis not present

## 2016-08-09 DIAGNOSIS — R509 Fever, unspecified: Secondary | ICD-10-CM | POA: Diagnosis not present

## 2016-08-09 DIAGNOSIS — Z8249 Family history of ischemic heart disease and other diseases of the circulatory system: Secondary | ICD-10-CM | POA: Diagnosis not present

## 2016-08-09 DIAGNOSIS — F17290 Nicotine dependence, other tobacco product, uncomplicated: Secondary | ICD-10-CM | POA: Diagnosis present

## 2016-08-09 DIAGNOSIS — Z880 Allergy status to penicillin: Secondary | ICD-10-CM

## 2016-08-09 DIAGNOSIS — I10 Essential (primary) hypertension: Secondary | ICD-10-CM | POA: Diagnosis present

## 2016-08-09 DIAGNOSIS — M25561 Pain in right knee: Secondary | ICD-10-CM | POA: Diagnosis present

## 2016-08-09 DIAGNOSIS — R0609 Other forms of dyspnea: Secondary | ICD-10-CM | POA: Diagnosis not present

## 2016-08-09 DIAGNOSIS — E785 Hyperlipidemia, unspecified: Secondary | ICD-10-CM | POA: Diagnosis present

## 2016-08-09 DIAGNOSIS — Z86010 Personal history of colonic polyps: Secondary | ICD-10-CM | POA: Diagnosis not present

## 2016-08-09 DIAGNOSIS — H9190 Unspecified hearing loss, unspecified ear: Secondary | ICD-10-CM | POA: Diagnosis present

## 2016-08-09 DIAGNOSIS — I252 Old myocardial infarction: Secondary | ICD-10-CM

## 2016-08-09 DIAGNOSIS — M1711 Unilateral primary osteoarthritis, right knee: Secondary | ICD-10-CM | POA: Diagnosis present

## 2016-08-09 DIAGNOSIS — E119 Type 2 diabetes mellitus without complications: Secondary | ICD-10-CM | POA: Diagnosis present

## 2016-08-09 DIAGNOSIS — R000 Tachycardia, unspecified: Secondary | ICD-10-CM | POA: Diagnosis not present

## 2016-08-09 DIAGNOSIS — F419 Anxiety disorder, unspecified: Secondary | ICD-10-CM | POA: Diagnosis present

## 2016-08-09 DIAGNOSIS — Z09 Encounter for follow-up examination after completed treatment for conditions other than malignant neoplasm: Secondary | ICD-10-CM

## 2016-08-09 HISTORY — PX: KNEE ARTHROPLASTY: SHX992

## 2016-08-09 LAB — TYPE AND SCREEN
ABO/RH(D): O NEG
Antibody Screen: NEGATIVE

## 2016-08-09 LAB — GLUCOSE, CAPILLARY
Glucose-Capillary: 132 mg/dL — ABNORMAL HIGH (ref 65–99)
Glucose-Capillary: 108 mg/dL — ABNORMAL HIGH (ref 65–99)
Glucose-Capillary: 147 mg/dL — ABNORMAL HIGH (ref 65–99)

## 2016-08-09 IMAGING — DX DG KNEE 1-2V PORT*R*
2 series · 2 of 2 positions shown · non-contrast
Comparison: [DATE].

CLINICAL DATA: Status post right knee arthroplasty.

EXAM:
PORTABLE RIGHT KNEE - 1-2 VIEW

[knee lat]
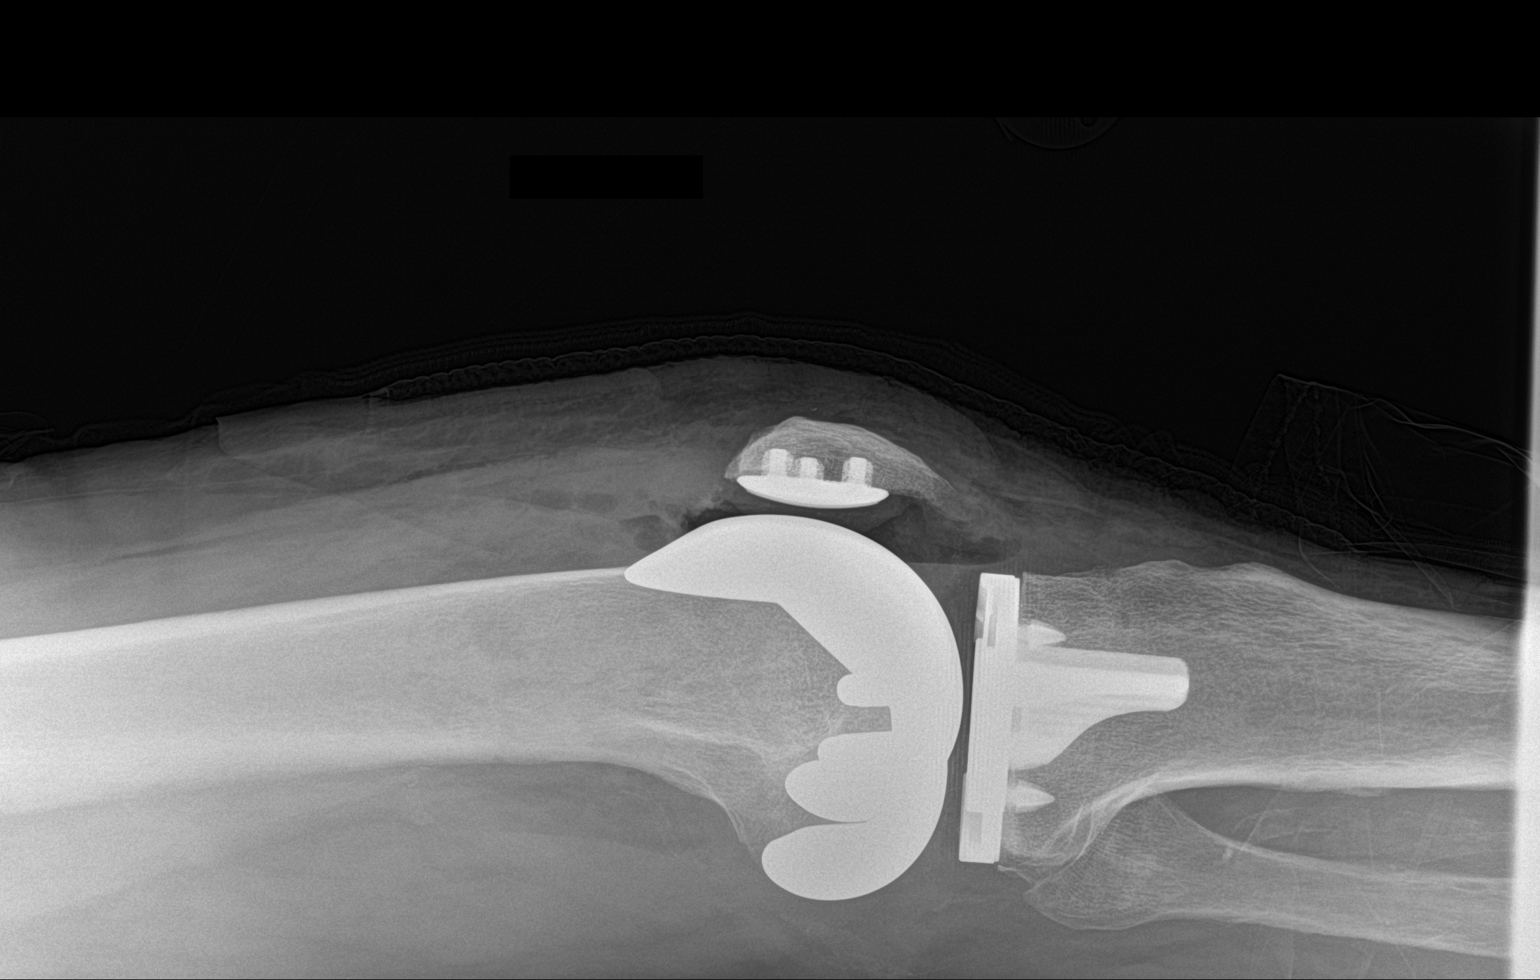

[knee ap]
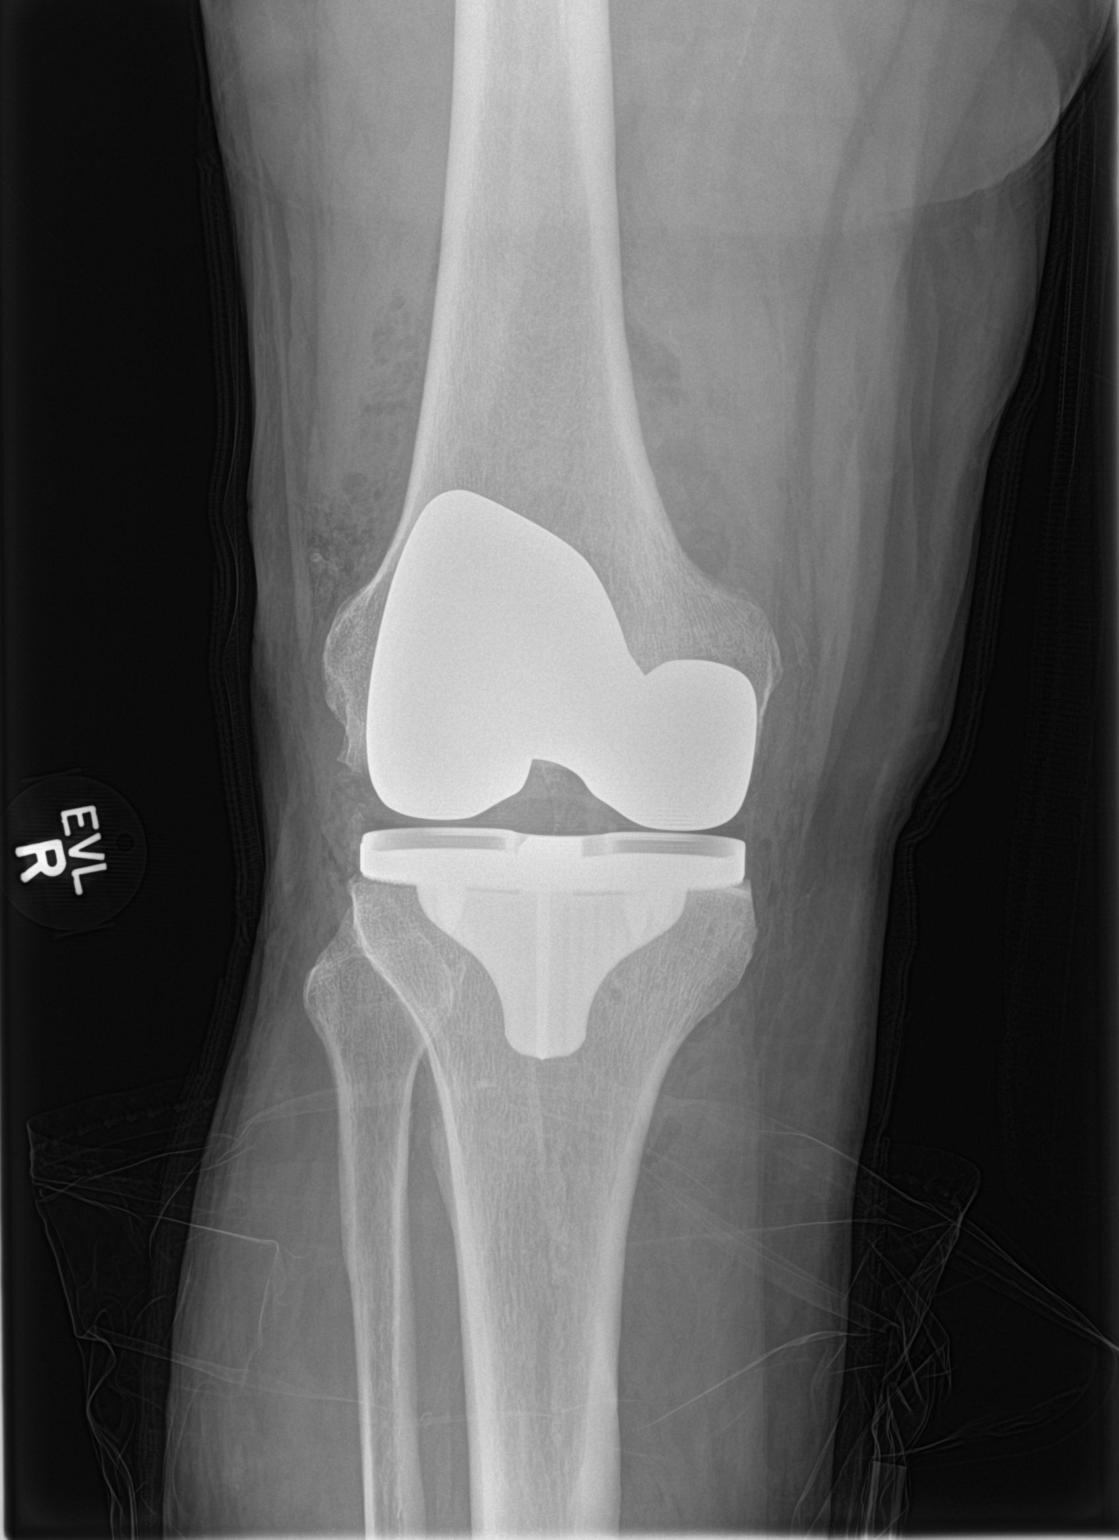

[2 of 2 positions shown; findings below may reference images not displayed]

FINDINGS: Interval right total knee prosthesis in satisfactory position and
alignment. No fracture or dislocation is seen.
IMPRESSION: Satisfactory postoperative appearance of a right total knee
prosthesis.

## 2016-08-09 SURGERY — ARTHROPLASTY, KNEE, TOTAL, USING IMAGELESS COMPUTER-ASSISTED NAVIGATION
Anesthesia: Spinal | Site: Knee | Laterality: Right

## 2016-08-09 MED ORDER — PROPOFOL 10 MG/ML IV BOLUS
INTRAVENOUS | Status: AC
  Filled 2016-08-09: qty 20

## 2016-08-09 MED ORDER — LORAZEPAM 1 MG PO TABS
1.0000 mg | ORAL_TABLET | Freq: Three times a day (TID) | ORAL | Status: DC | PRN
  Administered 2016-08-09 – 2016-08-10 (×3): 1 mg via ORAL
  Filled 2016-08-09 (×4): qty 1

## 2016-08-09 MED ORDER — FENTANYL CITRATE (PF) 100 MCG/2ML IJ SOLN: 25.0000 ug | INTRAMUSCULAR | Status: DC | PRN

## 2016-08-09 MED ORDER — MIDAZOLAM HCL 2 MG/2ML IJ SOLN
INTRAMUSCULAR | Status: AC
  Filled 2016-08-09: qty 2

## 2016-08-09 MED ORDER — METHOCARBAMOL 1000 MG/10ML IJ SOLN
500.0000 mg | Freq: Four times a day (QID) | INTRAVENOUS | Status: DC | PRN
  Filled 2016-08-09: qty 5

## 2016-08-09 MED ORDER — SODIUM CHLORIDE 0.9 % IV SOLN
1000.0000 mg | INTRAVENOUS | Status: AC
  Administered 2016-08-09: 1000 mg via INTRAVENOUS
  Filled 2016-08-09: qty 1100

## 2016-08-09 MED ORDER — PROPOFOL 10 MG/ML IV BOLUS
INTRAVENOUS | Status: DC | PRN
  Administered 2016-08-09: 20 mg via INTRAVENOUS

## 2016-08-09 MED ORDER — PHENOL 1.4 % MT LIQD: 1.0000 | OROMUCOSAL | Status: DC | PRN

## 2016-08-09 MED ORDER — ACETAMINOPHEN 325 MG PO TABS
650.0000 mg | ORAL_TABLET | Freq: Four times a day (QID) | ORAL | Status: DC | PRN
  Administered 2016-08-11 – 2016-08-12 (×3): 650 mg via ORAL
  Filled 2016-08-09 (×3): qty 2

## 2016-08-09 MED ORDER — MENTHOL 3 MG MT LOZG: 1.0000 | LOZENGE | OROMUCOSAL | Status: DC | PRN

## 2016-08-09 MED ORDER — LACTATED RINGERS IV SOLN
INTRAVENOUS | Status: DC | PRN
  Administered 2016-08-09: 18:00:00 via INTRAVENOUS

## 2016-08-09 MED ORDER — DOCUSATE SODIUM 100 MG PO CAPS
100.0000 mg | ORAL_CAPSULE | Freq: Two times a day (BID) | ORAL | Status: DC
  Administered 2016-08-09 – 2016-08-13 (×8): 100 mg via ORAL
  Filled 2016-08-09 (×8): qty 1

## 2016-08-09 MED ORDER — MEPERIDINE HCL 50 MG/ML IJ SOLN: 6.2500 mg | INTRAMUSCULAR | Status: DC | PRN

## 2016-08-09 MED ORDER — OXYCODONE HCL 5 MG PO TABS
5.0000 mg | ORAL_TABLET | ORAL | Status: DC | PRN
  Administered 2016-08-09 – 2016-08-10 (×6): 10 mg via ORAL
  Administered 2016-08-11: 5 mg via ORAL
  Administered 2016-08-11: 10 mg via ORAL
  Administered 2016-08-11: 5 mg via ORAL
  Administered 2016-08-11 (×2): 10 mg via ORAL
  Administered 2016-08-12: 5 mg via ORAL
  Administered 2016-08-12 – 2016-08-13 (×9): 10 mg via ORAL
  Filled 2016-08-09 (×16): qty 2
  Filled 2016-08-09: qty 1
  Filled 2016-08-09: qty 2
  Filled 2016-08-09: qty 1
  Filled 2016-08-09 (×3): qty 2

## 2016-08-09 MED ORDER — ALUM & MAG HYDROXIDE-SIMETH 200-200-20 MG/5ML PO SUSP: 30.0000 mL | ORAL | Status: DC | PRN

## 2016-08-09 MED ORDER — ONDANSETRON HCL 4 MG/2ML IJ SOLN
4.0000 mg | Freq: Four times a day (QID) | INTRAMUSCULAR | Status: DC | PRN
  Administered 2016-08-11: 4 mg via INTRAVENOUS
  Filled 2016-08-09: qty 2

## 2016-08-09 MED ORDER — DIPHENHYDRAMINE HCL 12.5 MG/5ML PO ELIX: 12.5000 mg | ORAL_SOLUTION | ORAL | Status: DC | PRN

## 2016-08-09 MED ORDER — FENTANYL CITRATE (PF) 100 MCG/2ML IJ SOLN
INTRAMUSCULAR | Status: AC
  Filled 2016-08-09: qty 2

## 2016-08-09 MED ORDER — ONDANSETRON HCL 4 MG PO TABS: 4.0000 mg | ORAL_TABLET | Freq: Four times a day (QID) | ORAL | Status: DC | PRN

## 2016-08-09 MED ORDER — ACETAMINOPHEN 10 MG/ML IV SOLN
INTRAVENOUS | Status: AC
  Filled 2016-08-09: qty 100

## 2016-08-09 MED ORDER — OXYCODONE HCL ER 10 MG PO T12A
10.0000 mg | EXTENDED_RELEASE_TABLET | Freq: Two times a day (BID) | ORAL | Status: DC
  Administered 2016-08-09 – 2016-08-13 (×8): 10 mg via ORAL
  Filled 2016-08-09 (×9): qty 1

## 2016-08-09 MED ORDER — SIMVASTATIN 20 MG PO TABS
40.0000 mg | ORAL_TABLET | Freq: Every day | ORAL | Status: DC
  Administered 2016-08-09 – 2016-08-12 (×4): 40 mg via ORAL
  Filled 2016-08-09 (×4): qty 2

## 2016-08-09 MED ORDER — KETOROLAC TROMETHAMINE 30 MG/ML IJ SOLN
INTRAMUSCULAR | Status: DC | PRN
  Administered 2016-08-09: 30 mg

## 2016-08-09 MED ORDER — DEXAMETHASONE SODIUM PHOSPHATE 10 MG/ML IJ SOLN: 10.0000 mg | Freq: Once | INTRAMUSCULAR | Status: DC

## 2016-08-09 MED ORDER — DEXAMETHASONE SODIUM PHOSPHATE 10 MG/ML IJ SOLN
INTRAMUSCULAR | Status: AC
  Filled 2016-08-09: qty 1

## 2016-08-09 MED ORDER — POVIDONE-IODINE 10 % EX SWAB: 2.0000 "application " | Freq: Once | CUTANEOUS | Status: DC

## 2016-08-09 MED ORDER — 0.9 % SODIUM CHLORIDE (POUR BTL) OPTIME
TOPICAL | Status: DC | PRN
  Administered 2016-08-09: 1000 mL

## 2016-08-09 MED ORDER — BUPIVACAINE HCL (PF) 0.5 % IJ SOLN
INTRAMUSCULAR | Status: DC | PRN
  Administered 2016-08-09: 3 mL

## 2016-08-09 MED ORDER — ISOPROPYL ALCOHOL 70 % SOLN
Status: DC | PRN
  Administered 2016-08-09: 1 via TOPICAL

## 2016-08-09 MED ORDER — KETOROLAC TROMETHAMINE 15 MG/ML IJ SOLN
15.0000 mg | Freq: Four times a day (QID) | INTRAMUSCULAR | Status: AC
  Administered 2016-08-09 – 2016-08-10 (×4): 15 mg via INTRAVENOUS
  Filled 2016-08-09 (×4): qty 1

## 2016-08-09 MED ORDER — ISOSORBIDE MONONITRATE ER 30 MG PO TB24
30.0000 mg | ORAL_TABLET | Freq: Every day | ORAL | Status: DC
  Administered 2016-08-10 – 2016-08-13 (×4): 30 mg via ORAL
  Filled 2016-08-09 (×4): qty 1

## 2016-08-09 MED ORDER — METOCLOPRAMIDE HCL 5 MG/ML IJ SOLN: 5.0000 mg | Freq: Three times a day (TID) | INTRAMUSCULAR | Status: DC | PRN

## 2016-08-09 MED ORDER — POLYETHYLENE GLYCOL 3350 17 G PO PACK: 17.0000 g | PACK | Freq: Every day | ORAL | Status: DC | PRN

## 2016-08-09 MED ORDER — SENNA 8.6 MG PO TABS
2.0000 | ORAL_TABLET | Freq: Every day | ORAL | Status: DC
  Administered 2016-08-10 – 2016-08-12 (×3): 17.2 mg via ORAL
  Filled 2016-08-09 (×4): qty 2

## 2016-08-09 MED ORDER — METFORMIN HCL 500 MG PO TABS
1000.0000 mg | ORAL_TABLET | Freq: Two times a day (BID) | ORAL | Status: DC
  Administered 2016-08-10 – 2016-08-13 (×7): 1000 mg via ORAL
  Filled 2016-08-09 (×7): qty 2

## 2016-08-09 MED ORDER — LINAGLIPTIN 5 MG PO TABS
5.0000 mg | ORAL_TABLET | Freq: Every day | ORAL | Status: DC
  Administered 2016-08-10 – 2016-08-13 (×4): 5 mg via ORAL
  Filled 2016-08-09 (×4): qty 1

## 2016-08-09 MED ORDER — KETOROLAC TROMETHAMINE 30 MG/ML IJ SOLN
INTRAMUSCULAR | Status: AC
  Filled 2016-08-09: qty 1

## 2016-08-09 MED ORDER — MIDAZOLAM HCL 5 MG/5ML IJ SOLN
INTRAMUSCULAR | Status: DC | PRN
  Administered 2016-08-09 (×3): 2 mg via INTRAVENOUS

## 2016-08-09 MED ORDER — METOPROLOL TARTRATE 50 MG PO TABS
50.0000 mg | ORAL_TABLET | Freq: Two times a day (BID) | ORAL | Status: DC
  Administered 2016-08-09 – 2016-08-13 (×6): 50 mg via ORAL
  Filled 2016-08-09: qty 2
  Filled 2016-08-09 (×4): qty 1
  Filled 2016-08-09: qty 2
  Filled 2016-08-09 (×2): qty 1

## 2016-08-09 MED ORDER — BUPIVACAINE HCL 0.25 % IJ SOLN
INTRAMUSCULAR | Status: DC | PRN
  Administered 2016-08-09: 30 mL

## 2016-08-09 MED ORDER — ONDANSETRON HCL 4 MG/2ML IJ SOLN
INTRAMUSCULAR | Status: AC
Start: 2016-08-09 — End: 2016-08-09
  Filled 2016-08-09: qty 2

## 2016-08-09 MED ORDER — FENTANYL CITRATE (PF) 100 MCG/2ML IJ SOLN
INTRAMUSCULAR | Status: DC | PRN
  Administered 2016-08-09 (×2): 50 ug via INTRAVENOUS

## 2016-08-09 MED ORDER — TRANEXAMIC ACID 1000 MG/10ML IV SOLN
1000.0000 mg | Freq: Once | INTRAVENOUS | Status: AC
  Administered 2016-08-09: 1000 mg via INTRAVENOUS
  Filled 2016-08-09: qty 10

## 2016-08-09 MED ORDER — CHLORHEXIDINE GLUCONATE 4 % EX LIQD: 60.0000 mL | Freq: Once | CUTANEOUS | Status: DC

## 2016-08-09 MED ORDER — METHOCARBAMOL 500 MG PO TABS
500.0000 mg | ORAL_TABLET | Freq: Four times a day (QID) | ORAL | Status: DC | PRN
  Administered 2016-08-09 – 2016-08-12 (×8): 500 mg via ORAL
  Filled 2016-08-09 (×9): qty 1

## 2016-08-09 MED ORDER — METOCLOPRAMIDE HCL 5 MG PO TABS: 5.0000 mg | ORAL_TABLET | Freq: Three times a day (TID) | ORAL | Status: DC | PRN

## 2016-08-09 MED ORDER — PROPOFOL 500 MG/50ML IV EMUL
INTRAVENOUS | Status: DC | PRN
  Administered 2016-08-09: 100 ug/kg/min via INTRAVENOUS

## 2016-08-09 MED ORDER — ACETAMINOPHEN 650 MG RE SUPP: 650.0000 mg | Freq: Four times a day (QID) | RECTAL | Status: DC | PRN

## 2016-08-09 MED ORDER — ASPIRIN 81 MG PO CHEW
81.0000 mg | CHEWABLE_TABLET | Freq: Two times a day (BID) | ORAL | Status: DC
  Administered 2016-08-09 – 2016-08-13 (×8): 81 mg via ORAL
  Filled 2016-08-09 (×8): qty 1

## 2016-08-09 MED ORDER — BUPIVACAINE HCL (PF) 0.5 % IJ SOLN
INTRAMUSCULAR | Status: AC
  Filled 2016-08-09: qty 30

## 2016-08-09 MED ORDER — ACETAMINOPHEN 10 MG/ML IV SOLN
1000.0000 mg | INTRAVENOUS | Status: AC
  Administered 2016-08-09: 1000 mg via INTRAVENOUS
  Filled 2016-08-09: qty 100

## 2016-08-09 MED ORDER — BUPIVACAINE HCL (PF) 0.25 % IJ SOLN
INTRAMUSCULAR | Status: AC
  Filled 2016-08-09: qty 30

## 2016-08-09 MED ORDER — SODIUM CHLORIDE 0.9 % IV SOLN
INTRAVENOUS | Status: DC
  Administered 2016-08-09: 13:00:00 via INTRAVENOUS

## 2016-08-09 MED ORDER — NITROGLYCERIN 0.4 MG SL SUBL: 0.4000 mg | SUBLINGUAL_TABLET | SUBLINGUAL | Status: DC | PRN

## 2016-08-09 MED ORDER — SUCCINYLCHOLINE CHLORIDE 20 MG/ML IJ SOLN
INTRAMUSCULAR | Status: AC
  Filled 2016-08-09: qty 1

## 2016-08-09 MED ORDER — HYDROGEN PEROXIDE 3 % EX SOLN
CUTANEOUS | Status: DC | PRN
  Administered 2016-08-09: 1

## 2016-08-09 MED ORDER — METOCLOPRAMIDE HCL 5 MG/ML IJ SOLN: 10.0000 mg | Freq: Once | INTRAMUSCULAR | Status: DC | PRN

## 2016-08-09 MED ORDER — PANTOPRAZOLE SODIUM 40 MG PO TBEC
40.0000 mg | DELAYED_RELEASE_TABLET | Freq: Every day | ORAL | Status: DC
  Administered 2016-08-10 – 2016-08-13 (×4): 40 mg via ORAL
  Filled 2016-08-09 (×4): qty 1

## 2016-08-09 MED ORDER — FUROSEMIDE 40 MG PO TABS
40.0000 mg | ORAL_TABLET | Freq: Two times a day (BID) | ORAL | Status: DC
  Administered 2016-08-10 – 2016-08-13 (×7): 40 mg via ORAL
  Filled 2016-08-09 (×7): qty 1

## 2016-08-09 MED ORDER — SODIUM CHLORIDE 0.9 % IJ SOLN
INTRAMUSCULAR | Status: AC
  Filled 2016-08-09: qty 50

## 2016-08-09 MED ORDER — LACTATED RINGERS IV SOLN: INTRAVENOUS | Status: DC | PRN

## 2016-08-09 MED ORDER — DEXAMETHASONE SODIUM PHOSPHATE 10 MG/ML IJ SOLN
INTRAMUSCULAR | Status: DC | PRN
  Administered 2016-08-09: 10 mg via INTRAVENOUS

## 2016-08-09 MED ORDER — SODIUM CHLORIDE 0.9 % IJ SOLN
INTRAMUSCULAR | Status: DC | PRN
  Administered 2016-08-09: 30 mL

## 2016-08-09 MED ORDER — SODIUM CHLORIDE 0.9 % IV SOLN
INTRAVENOUS | Status: DC
  Administered 2016-08-09: via INTRAVENOUS

## 2016-08-09 MED ORDER — RAMIPRIL 2.5 MG PO CAPS
2.5000 mg | ORAL_CAPSULE | Freq: Every day | ORAL | Status: DC
  Administered 2016-08-10 – 2016-08-13 (×4): 2.5 mg via ORAL
  Filled 2016-08-09 (×4): qty 1

## 2016-08-09 MED ORDER — VANCOMYCIN HCL IN DEXTROSE 1-5 GM/200ML-% IV SOLN
1000.0000 mg | Freq: Two times a day (BID) | INTRAVENOUS | Status: AC
  Administered 2016-08-10: 1000 mg via INTRAVENOUS
  Filled 2016-08-09: qty 200

## 2016-08-09 SURGICAL SUPPLY — 61 items
ADH SKN CLS APL DERMABOND .7 (GAUZE/BANDAGES/DRESSINGS) ×2
BAG SPEC THK2 15X12 ZIP CLS (MISCELLANEOUS)
BAG ZIPLOCK 12X15 (MISCELLANEOUS) IMPLANT
BANDAGE ACE 4X5 VEL STRL LF (GAUZE/BANDAGES/DRESSINGS) ×2 IMPLANT
BANDAGE ACE 6X5 VEL STRL LF (GAUZE/BANDAGES/DRESSINGS) ×2 IMPLANT
BANDAGE ESMARK 6X9 LF (GAUZE/BANDAGES/DRESSINGS) ×1 IMPLANT
BLADE SAW RECIPROCATING 77.5 (BLADE) ×2 IMPLANT
BNDG CMPR 9X6 STRL LF SNTH (GAUZE/BANDAGES/DRESSINGS) ×1
BNDG ESMARK 6X9 LF (GAUZE/BANDAGES/DRESSINGS) ×2
CAPT KNEE TRIATH TK-4 ×1 IMPLANT
CHLORAPREP W/TINT 26ML (MISCELLANEOUS) ×4 IMPLANT
CUFF TOURN SGL QUICK 34 (TOURNIQUET CUFF) ×2
CUFF TRNQT CYL 34X4X40X1 (TOURNIQUET CUFF) ×1 IMPLANT
DECANTER SPIKE VIAL GLASS SM (MISCELLANEOUS) ×4 IMPLANT
DERMABOND ADVANCED (GAUZE/BANDAGES/DRESSINGS) ×2
DERMABOND ADVANCED .7 DNX12 (GAUZE/BANDAGES/DRESSINGS) ×2 IMPLANT
DRAPE SHEET LG 3/4 BI-LAMINATE (DRAPES) ×4 IMPLANT
DRAPE U-SHAPE 47X51 STRL (DRAPES) ×2 IMPLANT
DRSG AQUACEL AG ADV 3.5X10 (GAUZE/BANDAGES/DRESSINGS) ×2 IMPLANT
DRSG TEGADERM 4X4.75 (GAUZE/BANDAGES/DRESSINGS) IMPLANT
ELECT BLADE TIP CTD 4 INCH (ELECTRODE) ×1 IMPLANT
ELECT REM PT RETURN 9FT ADLT (ELECTROSURGICAL) ×2
ELECTRODE REM PT RTRN 9FT ADLT (ELECTROSURGICAL) ×1 IMPLANT
EVACUATOR 1/8 PVC DRAIN (DRAIN) IMPLANT
GAUZE SPONGE 4X4 12PLY STRL (GAUZE/BANDAGES/DRESSINGS) ×2 IMPLANT
GLOVE BIO SURGEON STRL SZ8.5 (GLOVE) ×4 IMPLANT
GLOVE BIOGEL PI IND STRL 8.5 (GLOVE) ×1 IMPLANT
GLOVE BIOGEL PI INDICATOR 8.5 (GLOVE) ×1
GOWN SPEC L3 XXLG W/TWL (GOWN DISPOSABLE) ×2 IMPLANT
HANDPIECE INTERPULSE COAX TIP (DISPOSABLE) ×2
HOOD PEEL AWAY FLYTE STAYCOOL (MISCELLANEOUS) ×4 IMPLANT
MARKER SKIN DUAL TIP RULER LAB (MISCELLANEOUS) ×4 IMPLANT
NDL SPNL 18GX3.5 QUINCKE PK (NEEDLE) ×1 IMPLANT
NEEDLE SPNL 18GX3.5 QUINCKE PK (NEEDLE) ×2 IMPLANT
NS IRRIG 1000ML POUR BTL (IV SOLUTION) ×2 IMPLANT
PACK TOTAL KNEE CUSTOM (KITS) ×2 IMPLANT
PADDING CAST COTTON 6X4 STRL (CAST SUPPLIES) ×2 IMPLANT
POSITIONER SURGICAL ARM (MISCELLANEOUS) ×2 IMPLANT
SAW OSC TIP CART 19.5X105X1.3 (SAW) ×2 IMPLANT
SEALER BIPOLAR AQUA 6.0 (INSTRUMENTS) ×2 IMPLANT
SET HNDPC FAN SPRY TIP SCT (DISPOSABLE) ×1 IMPLANT
SET PAD KNEE POSITIONER (MISCELLANEOUS) ×2 IMPLANT
SOL PREP POV-IOD 4OZ 10% (MISCELLANEOUS) ×2 IMPLANT
SPONGE DRAIN TRACH 4X4 STRL 2S (GAUZE/BANDAGES/DRESSINGS) IMPLANT
SPONGE LAP 18X18 X RAY DECT (DISPOSABLE) IMPLANT
SUCTION FRAZIER HANDLE 12FR (TUBING) ×1
SUCTION TUBE FRAZIER 12FR DISP (TUBING) ×1 IMPLANT
SUT MNCRL AB 3-0 PS2 18 (SUTURE) ×2 IMPLANT
SUT MON AB 2-0 CT1 36 (SUTURE) ×4 IMPLANT
SUT STRATAFIX PDO 1 14 VIOLET (SUTURE) ×2
SUT STRATFX PDO 1 14 VIOLET (SUTURE) ×1
SUT VIC AB 1 CT1 36 (SUTURE) ×6 IMPLANT
SUT VIC AB 2-0 CT1 27 (SUTURE) ×2
SUT VIC AB 2-0 CT1 TAPERPNT 27 (SUTURE) ×1 IMPLANT
SUTURE STRATFX PDO 1 14 VIOLET (SUTURE) ×1 IMPLANT
SYR 50ML LL SCALE MARK (SYRINGE) ×2 IMPLANT
TOWER CARTRIDGE SMART MIX (DISPOSABLE) IMPLANT
TRAY FOLEY W/METER SILVER 16FR (SET/KITS/TRAYS/PACK) IMPLANT
WATER STERILE IRR 1500ML POUR (IV SOLUTION) ×2 IMPLANT
WRAP KNEE MAXI GEL POST OP (GAUZE/BANDAGES/DRESSINGS) ×2 IMPLANT
YANKAUER SUCT BULB TIP 10FT TU (MISCELLANEOUS) ×2 IMPLANT

## 2016-08-09 NOTE — Transfer of Care (Signed)
Immediate Anesthesia Transfer of Care Note  Patient: Vincent [name]  Procedure(s) Performed: Procedure(s) with comments: RIGHT TOTAL KNEE ARTHROPLASTY WITH COMPUTER NAVIGATION (Right) - Needs RNFA  Patient Location: PACU  Anesthesia Type:Spinal  Level of Consciousness:  sedated, patient cooperative and responds to stimulation  Airway & Oxygen Therapy:Patient Spontanous Breathing and Patient connected to face mask oxgen  Post-op Assessment:  Report given to PACU RN and Post -op Vital signs reviewed and stable  Post vital signs:  Reviewed and stable  Last Vitals:  Vitals:   08/09/16 1245  BP: 132/73  Pulse: 72  Resp: 18  Temp: [id number] C    Complications: No apparent anesthesia complicationsImmediate Anesthesia Transfer of Care Note  Patient: Vincent [name]  Procedure(s) Performed: Procedure(s) with comments: RIGHT TOTAL KNEE ARTHROPLASTY WITH COMPUTER NAVIGATION (Right) - Needs RNFA  Patient Location: PACU  Anesthesia Type:Spinal  Level of Consciousness: awake, alert , oriented and patient cooperative  Airway & Oxygen Therapy: Patient Spontanous Breathing and Patient connected to nasal cannula oxygen  Post-op Assessment: Report given to RN and Post -op Vital signs reviewed and stable  Post vital signs: Reviewed and stable  Last Vitals:  Vitals:   08/09/16 1245  BP: 132/73  Pulse: 72  Resp: 18  Temp: 36.8 C    Last Pain:  Vitals:   08/09/16 1245  TempSrc: Oral      Patients Stated Pain Goal: 4 ([id number])  Complications: No apparent anesthesia complications

## 2016-08-09 NOTE — Discharge Instructions (Signed)
° °Dr. Yazlynn Birkeland °Total Joint Specialist °[facility] °[address] °[place], [place] [address] °[phone] ° °TOTAL KNEE REPLACEMENT POSTOPERATIVE DIRECTIONS ° ° ° °Knee Rehabilitation, Guidelines Following Surgery  °Results after knee surgery are often greatly improved when you follow the exercise, range of motion and muscle strengthening exercises prescribed by your doctor. Safety measures are also important to protect the knee from further injury. Any time any of these exercises cause you to have increased pain or swelling in your knee joint, decrease the amount until you are comfortable again and slowly increase them. If you have problems or questions, call your caregiver or physical therapist for advice.  ° °WEIGHT BEARING °Weight bearing as tolerated with assist device (walker, cane, etc) as directed, use it as long as suggested by your surgeon or therapist, typically at least 4-6 weeks. ° °HOME CARE INSTRUCTIONS  °Remove items at home which could result in a fall. This includes throw rugs or furniture in walking pathways.  °Continue medications as instructed at time of discharge. °You may have some home medications which will be placed on hold until you complete the course of blood thinner medication.  °You may start showering once you are discharged home but do not submerge the incision under water. Just pat the incision dry and apply a dry gauze dressing on daily. °Walk with walker as instructed.  °You may resume a sexual relationship in one month or when given the OK by your doctor.  °· Use walker as long as suggested by your caregivers. °· Avoid periods of inactivity such as sitting longer than an hour when not asleep. This helps prevent blood clots.  °You may put full weight on your legs and walk as much as is comfortable.  °You may return to work once you are cleared by your doctor.  °Do not drive a car for 6 weeks or until released by you surgeon.  °· Do not drive  while taking narcotics.  °Wear the elastic stockings for three weeks following surgery during the day but you may remove then at night. °Make sure you keep all of your appointments after your operation with all of your doctors and caregivers. You should call the office at the above phone number and make an appointment for approximately two weeks after the date of your surgery. °Do not remove your surgical dressing. The dressing is waterproof; you may take showers in 3 days, but do not take tub baths or submerge the dressing. °Please pick up a stool softener and laxative for home use as long as you are requiring pain medications. °· ICE to the affected knee every three hours for 30 minutes at a time and then as needed for pain and swelling.  Continue to use ice on the knee for pain and swelling from surgery. You may notice swelling that will progress down to the foot and ankle.  This is normal after surgery.  Elevate the leg when you are not up walking on it.   °It is important for you to complete the blood thinner medication as prescribed by your doctor. °· Continue to use the breathing machine which will help keep your temperature down.  It is common for your temperature to cycle up and down following surgery, especially at night when you are not up moving around and exerting yourself.  The breathing machine keeps your lungs expanded and your temperature down. ° °RANGE OF MOTION AND STRENGTHENING EXERCISES  °Rehabilitation of the knee is important following   a knee injury or an operation. After just a few days of immobilization, the muscles of the thigh which control the knee become weakened and shrink (atrophy). Knee exercises are designed to build up the tone and strength of the thigh muscles and to improve knee motion. Often times heat used for twenty to thirty minutes before working out will loosen up your tissues and help with improving the range of motion but do not use heat for the first two weeks following  surgery. These exercises can be done on a training (exercise) mat, on the floor, on a table or on a bed. Use what ever works the best and is most comfortable for you Knee exercises include:  °Leg Lifts - While your knee is still immobilized in a splint or cast, you can do straight leg raises. Lift the leg to 60 degrees, hold for 3 sec, and slowly lower the leg. Repeat 10-20 times 2-3 times daily. Perform this exercise against resistance later as your knee gets better.  °Quad and Hamstring Sets - Tighten up the muscle on the front of the thigh (Quad) and hold for 5-10 sec. Repeat this 10-20 times hourly. Hamstring sets are done by pushing the foot backward against an object and holding for 5-10 sec. Repeat as with quad sets.  °A rehabilitation program following serious knee injuries can speed recovery and prevent re-injury in the future due to weakened muscles. Contact your doctor or a physical therapist for more information on knee rehabilitation.  ° °SKILLED REHAB INSTRUCTIONS: °If the patient is transferred to a skilled rehab facility following release from the hospital, a list of the current medications will be sent to the facility for the patient to continue.  When discharged from the skilled rehab facility, please have the facility set up the patient's Home Health Physical Therapy prior to being released. Also, the skilled facility will be responsible for providing the patient with their medications at time of release from the facility to include their pain medication, the muscle relaxants, and their blood thinner medication. If the patient is still at the rehab facility at time of the two week follow up appointment, the skilled rehab facility will also need to assist the patient in arranging follow up appointment in our office and any transportation needs. ° °MAKE SURE YOU:  °Understand these instructions.  °Will watch your condition.  °Will get help right away if you are not doing well or get worse.   ° ° °Pick up stool softner and laxative for home use following surgery while on pain medications. °Do NOT remove your dressing. You may shower.  °Do not take tub baths or submerge incision under water. °May shower starting three days after surgery. °Please use a clean towel to pat the incision dry following showers. °Continue to use ice for pain and swelling after surgery. °Do not use any lotions or creams on the incision until instructed by your surgeon. ° °

## 2016-08-09 NOTE — Op Note (Signed)
OPERATIVE REPORT  SURGEON: Rod Can, MD   ASSISTANT: None  PREOPERATIVE DIAGNOSIS: Right knee arthritis.   POSTOPERATIVE DIAGNOSIS: Right knee arthritis.   PROCEDURE: Right total knee arthroplasty.   IMPLANTS: Stryker Triathlon CR femur, size 5. Stryker Tritanium tibia, size 5. X3 polyethelyene insert, size 9 mm, CR. 3 button asymmetric patella, size 35 mm.  ANESTHESIA:  Spinal  TOURNIQUET TIME: Not utilized.   ESTIMATED BLOOD LOSS: 350 mL.    ANTIBIOTICS: 1 g vancomycin.  DRAINS: None.  COMPLICATIONS: None   CONDITION: PACU - hemodynamically stable.   BRIEF CLINICAL NOTE: Vincent [name] is a 65 y.o. male with a long-standing history of Right knee arthritis. After failing conservative management, the patient was indicated for total knee arthroplasty. The risks, benefits, and alternatives to the procedure were explained, and the patient elected to proceed.  PROCEDURE IN DETAIL: Spinal anesthesia was obtained in the pre-op holding area. Once inside the operative room, a foley catheter was inserted. The patient was then positioned, a nonsterile tourniquet was placed, and the lower extremity was prepped and draped in the normal sterile surgical fashion. A time-out was called verifying side and site of surgery. The patient received IV antibiotics within 60 minutes of beginning the procedure. The tourniquet was not utilized.  An anterior approach to the knee was performed utilizing a medial parapatellar arthrotomy. A medial release was performed and the patellar fat pad was excised. Stryker navigation was used to cut the distal femur perpendicular to the mechanical axis. A freehand patellar resection was performed, and the patella was sized an prepared with 3 lug holes.  Nagivation was used to make a neutral proximal tibia resection, taking 8 mm of bone from the less affected  lateral side with 3 degrees of slope. The menisci were excised. A spacer block was placed, and the alignment and balance in extension were confirmed.   The distal femur was sized using the 3-degree external rotation guide referencing the posterior femoral cortex. The appropriate 4-in-1 cutting block was pinned into place. Rotation was checked using Whiteside's line, the epicondylar axis, and then confirmed with a spacer block in flexion. The remaining femoral cuts were performed, taking care to protect the MCL.  The tibia was sized and the trial tray was pinned into place. The remaining trail components were inserted. The knee was stable to varus and valgus stress through a full range of motion. The patella tracked centrally, and the PCL was well balanced. The trial components were removed, and the proximal tibial surface was prepared. Final components were impacted into place. The knee was tested for a final time and found to be well balanced.  The wound was copiously irrigated with a dilute betadine solution followed by normal saline with pulse lavage. Marcaine solution was injected into the periarticular soft tissue. The wound was closed in layers using #1 Vicryl and Stratafix for the fascia, 2-0 Vicryl for the subcutaneous fat, 2-0 Monocryl for the deep dermal layer, 3-0 running Monocryl subcuticular Stitch, and Dermabond for the skin. Once the glue was fully dried, an Aquacell Ag and compressive dressing were applied. Tthe patient was transported to the recovery room in stable condition. Sponge, needle, and instrument counts were correct at the end of the case x2. The patient tolerated the procedure well and there were no known complications.

## 2016-08-09 NOTE — Anesthesia Preprocedure Evaluation (Signed)
Anesthesia Evaluation  Patient identified by MRN, date of birth, ID band Patient awake    Reviewed: Allergy & Precautions, NPO status , Patient's Chart, lab work & pertinent test results  Airway Mallampati: II  TM Distance: >3 FB Neck ROM: Full    Dental no notable dental hx. (+) Edentulous Upper, Edentulous Lower   Pulmonary sleep apnea , former smoker,    Pulmonary exam normal breath sounds clear to auscultation       Cardiovascular hypertension, Pt. on medications + CAD, + Past MI and + Cardiac Stents  Normal cardiovascular exam Rhythm:Regular Rate:Normal     Neuro/Psych negative neurological ROS  negative psych ROS   GI/Hepatic negative GI ROS, Neg liver ROS,   Endo/Other  diabetes, Type 2, Oral Hypoglycemic Agents  Renal/GU negative Renal ROS  negative genitourinary   Musculoskeletal negative musculoskeletal ROS (+)   Abdominal   Peds negative pediatric ROS (+)  Hematology negative hematology ROS (+)   Anesthesia Other Findings   Reproductive/Obstetrics negative OB ROS                             Anesthesia Physical Anesthesia Plan  ASA: III  Anesthesia Plan: Spinal   Post-op Pain Management:    Induction:   Airway Management Planned: Simple Face Mask  Additional Equipment:   Intra-op Plan:   Post-operative Plan:   Informed Consent: I have reviewed the patients History and Physical, chart, labs and discussed the procedure including the risks, benefits and alternatives for the proposed anesthesia with the patient or authorized representative who has indicated his/her understanding and acceptance.   Dental advisory given  Plan Discussed with: CRNA  Anesthesia Plan Comments:         Anesthesia Quick Evaluation

## 2016-08-09 NOTE — H&P (View-Only) (Signed)
TOTAL KNEE ADMISSION H&P  Patient is being admitted for right total knee arthroplasty.  Subjective:  Chief Complaint:right knee pain.  HPI: Vincent [name], 65 y.o. male, has a history of pain and functional disability in the right knee due to arthritis and has failed non-surgical conservative treatments for greater than 12 weeks to includeNSAID's and/or analgesics, corticosteriod injections, flexibility and strengthening excercises, use of assistive devices, weight reduction as appropriate and activity modification.  Onset of symptoms was gradual, starting >10 years ago with gradually worsening course since that time. The patient noted no past surgery on the right knee(s).  Patient currently rates pain in the right knee(s) at 10 out of 10 with activity. Patient has night pain, worsening of pain with activity and weight bearing, pain that interferes with activities of daily living, pain with passive range of motion, crepitus and joint swelling.  Patient has evidence of subchondral cysts, subchondral sclerosis, periarticular osteophytes and joint space narrowing by imaging studies. There is no active infection.  Patient Active Problem List   Diagnosis Date Noted  . OSA (obstructive sleep apnea) 11/29/2011  . Syncope 07/24/2011  . ARM NUMBNESS 06/19/2010  . EDEMA 03/13/2010  . FASTING HYPERGLYCEMIA 03/13/2010  . CORONARY ATHEROSCLEROSIS NATIVE CORONARY ARTERY 06/20/2009  . COLONIC POLYPS, ADENOMATOUS 01/31/2009  . HYPERLIPIDEMIA-MIXED 01/31/2009  . Hypertension 01/31/2009  . CAD, UNSPECIFIED SITE 01/31/2009  . HEMORRHOIDS 01/31/2009  . DIVERTICULOSIS, MILD 01/31/2009   Past Medical History:  Diagnosis Date  . Acute MI   . Coronary artery disease   . Diverticulosis    mild  . Hemorrhoids   . Hx of colonic polyps   . Hyperlipidemia   . Hypertension   . Left ventricular dysfunction    hx of    Past Surgical History:  Procedure Laterality Date  . ANGIOPLASTY     and bare metal stent  placement  . CORONARY ANGIOPLASTY WITH STENT PLACEMENT       (Not in a hospital admission) Allergies  Allergen Reactions  . Penicillins Other (See Comments)    Has patient had a PCN reaction causing immediate rash, facial/tongue/throat swelling, SOB or lightheadedness with hypotension: Unknown Has patient had a PCN reaction causing severe rash involving mucus membranes or skin necrosis: Unknown Has patient had a PCN reaction that required hospitalization: Unknown Has patient had a PCN reaction occurring within the last 10 years: No If all of the above answers are "NO", then may proceed with Cephalosporin use.     Social History  Substance Use Topics  . Smoking status: Current Every Day Smoker    Types: Cigars  . Smokeless tobacco: Not on file  . Alcohol use No    Family History  Problem Relation Age of Onset  . Coronary artery disease      positive family hx of     Review of Systems  Constitutional: Negative.   HENT: Negative.   Eyes: Positive for blurred vision.  Respiratory: Negative.   Cardiovascular: Negative.   Gastrointestinal: Negative.   Genitourinary: Negative.   Musculoskeletal: Positive for back pain and joint pain.  Skin: Negative.   Neurological: Positive for dizziness.  Endo/Heme/Allergies: Negative.   Psychiatric/Behavioral: Negative.     Objective:  Physical Exam  Vitals reviewed. Constitutional: He is oriented to person, place, and time. He appears well-nourished.  HENT:  Head: Normocephalic and atraumatic.  Eyes: Conjunctivae and EOM are normal. Pupils are equal, round, and reactive to light.  Neck: Normal range of motion. Neck supple.  Cardiovascular:  Normal rate, regular rhythm and intact distal pulses.   Respiratory: No respiratory distress.  GI: He exhibits no distension.  Genitourinary:  Genitourinary Comments: deferred  Musculoskeletal:       Right knee: He exhibits decreased range of motion, swelling, effusion and abnormal alignment.  Tenderness found. Medial joint line and lateral joint line tenderness noted.  Neurological: He is alert and oriented to person, place, and time. He has normal reflexes.  Skin: Skin is warm and dry.  Psychiatric: His behavior is normal. Thought content normal.    Vital signs in last 24 hours: @VSRANGES @  Labs:   Estimated body mass index is 36.25 kg/m as calculated from the following:   Height as of 06/27/16: 5\' 8"  (1.727 m).   Weight as of 06/27/16: 108.1 kg (238 lb 6.4 oz).   Imaging Review Plain radiographs demonstrate severe degenerative joint disease of the right knee(s). The overall alignment ismild varus. The bone quality appears to be adequate for age and reported activity level.  Assessment/Plan:  End stage arthritis, right knee   The patient history, physical examination, clinical judgment of the provider and imaging studies are consistent with end stage degenerative joint disease of the right knee(s) and total knee arthroplasty is deemed medically necessary. The treatment options including medical management, injection therapy arthroscopy and arthroplasty were discussed at length. The risks and benefits of total knee arthroplasty were presented and reviewed. The risks due to aseptic loosening, infection, stiffness, patella tracking problems, thromboembolic complications and other imponderables were discussed. The patient acknowledged the explanation, agreed to proceed with the plan and consent was signed. Patient is being admitted for inpatient treatment for surgery, pain control, PT, OT, prophylactic antibiotics, VTE prophylaxis, progressive ambulation and ADL's and discharge planning. The patient is planning to be discharged home with home health services

## 2016-08-09 NOTE — Progress Notes (Signed)
Very difficult to assess patient. Says he's not hard of hearing but he doesn't always respond when spoken too.

## 2016-08-09 NOTE — Anesthesia Procedure Notes (Signed)
Spinal  Patient location during procedure: OR Start time: 08/09/2016 4:32 PM End time: 08/09/2016 4:38 PM Staffing Anesthesiologist: Montez Hageman Resident/CRNA: Lajuana Carry E Performed: resident/CRNA  Preanesthetic Checklist Completed: patient identified, site marked, surgical consent, pre-op evaluation, timeout performed, IV checked, risks and benefits discussed and monitors and equipment checked Spinal Block Patient position: sitting Prep: DuraPrep Patient monitoring: heart rate, continuous pulse ox and blood pressure Approach: midline Location: L3-4 Needle Needle type: Spinocan  Needle gauge: 22 G Needle length: 9 cm Assessment Sensory level: T6 Additional Notes Time out, sitting position, sterile prep and drape. Second attempt, clear CSF, pre/post injection. Tol well, return to supine.

## 2016-08-09 NOTE — Anesthesia Postprocedure Evaluation (Signed)
Anesthesia Post Note  Patient: Vincent [name]  Procedure(s) Performed: Procedure(s) (LRB): RIGHT TOTAL KNEE ARTHROPLASTY WITH COMPUTER NAVIGATION (Right)  Patient location during evaluation: PACU Anesthesia Type: Spinal Level of consciousness: awake and alert Pain management: pain level controlled Vital Signs Assessment: post-procedure vital signs reviewed and stable Respiratory status: spontaneous breathing and respiratory function stable Cardiovascular status: blood pressure returned to baseline and stable Postop Assessment: no headache, no backache and spinal receding Anesthetic complications: no    Last Vitals:  Vitals:   08/09/16 1930 08/09/16 1945  BP: 99/73 97/63  Pulse: 89 77  Resp: 14 18  Temp:      Last Pain:  Vitals:   08/09/16 1945  TempSrc:   PainSc: 0-No pain                 Montez Hageman

## 2016-08-09 NOTE — Progress Notes (Addendum)
Vanc test dose ran in while completing patient's assessment. Tolerated well.

## 2016-08-09 NOTE — Interval H&P Note (Signed)
History and Physical Interval Note:  08/09/2016 3:29 PM  Vincent [name]  has presented today for surgery, with the diagnosis of DJD Right  Knee  The various methods of treatment have been discussed with the patient and family. After consideration of risks, benefits and other options for treatment, the patient has consented to  Procedure(s) with comments: RIGHT TOTAL KNEE ARTHROPLASTY WITH COMPUTER NAVIGATION (Right) - Needs RNFA as a surgical intervention .  The patient's history has been reviewed, patient examined, no change in status, stable for surgery.  I have reviewed the patient's chart and labs.  Questions were answered to the patient's satisfaction.     Evin Chirco, Horald Pollen

## 2016-08-10 ENCOUNTER — Encounter (HOSPITAL_COMMUNITY): Payer: Self-pay | Admitting: Orthopedic Surgery

## 2016-08-10 LAB — CBC
HCT: 34.2 % — ABNORMAL LOW (ref 39.0–52.0)
Hemoglobin: 11.4 g/dL — ABNORMAL LOW (ref 13.0–17.0)
MCH: 30.6 pg (ref 26.0–34.0)
MCHC: 33.3 g/dL (ref 30.0–36.0)
MCV: 91.7 fL (ref 78.0–100.0)
Platelets: 186 10*3/uL (ref 150–400)
RBC: 3.73 MIL/uL — ABNORMAL LOW (ref 4.22–5.81)
RDW: 13.5 % (ref 11.5–15.5)
WBC: 10.4 10*3/uL (ref 4.0–10.5)

## 2016-08-10 LAB — BASIC METABOLIC PANEL
Anion gap: 8 (ref 5–15)
BUN: 13 mg/dL (ref 6–20)
CO2: 23 mmol/L (ref 22–32)
Calcium: 8.9 mg/dL (ref 8.9–10.3)
Chloride: 104 mmol/L (ref 101–111)
Creatinine, Ser: 0.9 mg/dL (ref 0.61–1.24)
GFR calc Af Amer: >60 mL/min (ref >60)
GFR calc non Af Amer: >60 mL/min (ref >60)
Glucose, Bld: 229 mg/dL — ABNORMAL HIGH (ref 65–99)
Potassium: 4.3 mmol/L (ref 3.5–5.1)
Sodium: 135 mmol/L (ref 135–145)

## 2016-08-10 LAB — GLUCOSE, CAPILLARY
Glucose-Capillary: 159 mg/dL — ABNORMAL HIGH (ref 65–99)
Glucose-Capillary: 141 mg/dL — ABNORMAL HIGH (ref 65–99)

## 2016-08-10 MED ORDER — METHOCARBAMOL 500 MG PO TABS: 500.0000 mg | ORAL_TABLET | Freq: Four times a day (QID) | ORAL | 0 refills | Status: DC | PRN

## 2016-08-10 MED ORDER — OXYCODONE HCL 5 MG PO TABS: 5.0000 mg | ORAL_TABLET | ORAL | 0 refills | Status: DC | PRN

## 2016-08-10 MED ORDER — DOCUSATE SODIUM 100 MG PO CAPS: 100.0000 mg | ORAL_CAPSULE | Freq: Two times a day (BID) | ORAL | 0 refills | Status: DC

## 2016-08-10 MED ORDER — SENNA 8.6 MG PO TABS: 2.0000 | ORAL_TABLET | Freq: Every day | ORAL | 0 refills | Status: DC

## 2016-08-10 MED ORDER — ASPIRIN 81 MG PO CHEW: 81.0000 mg | CHEWABLE_TABLET | Freq: Two times a day (BID) | ORAL | 1 refills | Status: DC

## 2016-08-10 MED ORDER — ONDANSETRON HCL 4 MG PO TABS: 4.0000 mg | ORAL_TABLET | Freq: Four times a day (QID) | ORAL | 0 refills | Status: DC | PRN

## 2016-08-10 NOTE — Progress Notes (Signed)
   Subjective:  Patient reports pain as mild to moderate.  Denies N/V/CP/SOB.  Objective:   VITALS:   Vitals:   08/09/16 2159 08/09/16 2318 08/10/16 0135 08/10/16 0532  BP: 126/67 119/61 121/78 100/61  Pulse: 79 83 72 66  Resp: 12 12 14 16   Temp: 98 F (36.7 C) 97.9 F (36.6 C) 98 F (36.7 C) 98.3 F (36.8 C)  TempSrc: Oral Oral Oral Oral  SpO2: 98% 96% 98% 96%  Weight:      Height:        NAD Neurologically intact ABD soft Sensation intact distally Intact pulses distally Dorsiflexion/Plantar flexion intact Incision: dressing C/D/I Compartment soft   Lab Results  Component Value Date   WBC 10.4 08/10/2016   HGB 11.4 (L) 08/10/2016   HCT 34.2 (L) 08/10/2016   MCV 91.7 08/10/2016   PLT 186 08/10/2016   BMET    Component Value Date/Time   NA 135 08/10/2016 0417   K 4.3 08/10/2016 0417   CL 104 08/10/2016 0417   CO2 23 08/10/2016 0417   GLUCOSE 229 (H) 08/10/2016 0417   BUN 13 08/10/2016 0417   CREATININE 0.90 08/10/2016 0417   CALCIUM 8.9 08/10/2016 0417   GFRNONAA >60 08/10/2016 0417   GFRAA >60 08/10/2016 0417     Assessment/Plan: 1 Day Post-Op   Principal Problem:   Osteoarthritis of right knee   WBAT with walker DVT ppx: ASA, SCDs, TEDs PO pain control PT/OT Dispo: check hgb in am, D/C home tomorrow with HHPT   Shaquelle Hernon, Horald Pollen 08/10/2016, 6:59 AM   Rod Can, MD Cell [phone]

## 2016-08-10 NOTE — Evaluation (Signed)
Occupational Therapy Evaluation Patient Details Name: BLANCHE LUCZAK MRN: [id number] DOB: 28-Feb-1951 Today's Date: 08/10/2016    History of Present Illness s/p R TKA   Clinical Impression   This 65 year old man was admitted for the above sx.  All education was completed.  Pt starts to move impulsively but self corrects. His wife cued him for safety in the bathroom    Follow Up Recommendations  Supervision/Assistance - 24 hour    Equipment Recommendations  3 in 1 bedside comode    Recommendations for Other Services       Precautions / Restrictions Precautions Precautions: Knee;Fall Restrictions Weight Bearing Restrictions: No      Mobility Bed Mobility               General bed mobility comments: oob  Transfers Overall transfer level: Needs assistance Equipment used: Rolling walker (2 wheeled) Transfers: Sit to/from Stand Sit to Stand: Min guard         General transfer comment: close guard for safety. Pt able to transition to standing without physical assistance.  Started to position hands incorrectly, but self corrected.  Cues for LE placement when sitting    Balance                                            ADL Overall ADL's : Needs assistance/impaired            LB dressing:  Max A, sit to stand             Toilet Transfer: Min guard;Ambulation;BSC;RW             General ADL Comments: wife assisted with bathing. Therapist assisted with LB dressing.    Pt is very HOH and yells when he talks.  Wife was cueing him for safety in bathroom--therapist right outside of door.  Pt states wife was a CNA.  Educated on tub transfer and safety.  He can borrow a tub seat, but explained he still needs to be able to safely step over tub to use this.  Pt is taking a lot of weight through his arms when he is walking     Vision     Perception     Praxis      Pertinent Vitals/Pain  Faces 6 hurts even more Sore R knee and  thigh Repositioned, limited activity, monitored pain, premedicated, ice applied     Hand Dominance     Extremity/Trunk Assessment Upper Extremity Assessment Upper Extremity Assessment: Overall WFL for tasks assessed           Communication Communication Communication: HOH   Cognition Arousal/Alertness: Awake/alert Behavior During Therapy: Impulsive Overall Cognitive Status: Within Functional Limits for tasks assessed                     General Comments       Exercises       Shoulder Instructions      Home Living Family/patient expects to be discharged to:: Private residence Living Arrangements: Spouse/significant other Available Help at Discharge: Family               Bathroom Shower/Tub: Tub/shower unit Shower/tub characteristics: Architectural technologist: Standard         Additional Comments: family has a tub seat he can borrow      Prior Functioning/Environment Level of  Independence: Independent                 OT Problem List:     OT Treatment/Interventions:      OT Goals(Current goals can be found in the care plan section) Acute Rehab OT Goals Patient Stated Goal: return to independence OT Goal Formulation: All assessment and education complete, DC therapy  OT Frequency:     Barriers to D/C:            Co-evaluation              End of Session    Activity Tolerance: Patient tolerated treatment well Patient left: in chair;with call bell/phone within reach;with chair alarm set;with family/visitor present   Time: [id number] OT Time Calculation (min): 35 min Charges:  OT General Charges $OT Visit: 1 Procedure OT Evaluation $OT Eval Low Complexity: 1 Procedure OT Treatments $Self Care/Home Management : 8-22 mins G-Codes:    Latera Mclin 09-03-2016, 11:59 AM  Lesle Chris, OTR/L [phone] 09-03-16

## 2016-08-10 NOTE — Progress Notes (Signed)
Physical Therapy Treatment Patient Details Name: Vincent [name] MRN: [id number] DOB: 22-Dec-1950 Today's Date: Aug 22, 2016    History of Present Illness s/p R TKA    PT Comments    Pt cooperative and making steady progress with increased time required.  Follow Up Recommendations  Home health PT     Equipment Recommendations  None recommended by PT    Recommendations for Other Services OT consult     Precautions / Restrictions Precautions Precautions: Knee;Fall Restrictions Weight Bearing Restrictions: No Other Position/Activity Restrictions: WBAT    Mobility  Bed Mobility               General bed mobility comments: NT - OOB with nursing and requests up in chair for dinner  Transfers Overall transfer level: Needs assistance Equipment used: Rolling walker (2 wheeled) Transfers: Sit to/from Stand Sit to Stand: Min guard         General transfer comment: close guard for safety. Pt able to transition to standing without physical assistance.  Cues for LE management and use of UEs to self assist  Ambulation/Gait Ambulation/Gait assistance: Min assist;Min guard Ambulation Distance (Feet): 40 Feet Assistive device: Rolling walker (2 wheeled) Gait Pattern/deviations: Step-to pattern;Shuffle;Trunk flexed Gait velocity: decr Gait velocity interpretation: Below normal speed for age/gender General Gait Details: cues for sequence, posture and position from [employer]            Wheelchair Mobility    Modified Rankin (Stroke Patients Only)       Balance                                    Cognition Arousal/Alertness: Awake/alert Behavior During Therapy: Impulsive Overall Cognitive Status: Within Functional Limits for tasks assessed                      Exercises      General Comments        Pertinent Vitals/Pain Pain Assessment: 0-10 Pain Score: 7  Pain Location: R knee Pain Descriptors / Indicators:  Aching;Sore Pain Intervention(s): Limited activity within patient's tolerance;Monitored during session;Premedicated before session;Ice applied;Patient requesting pain meds-RN notified    Home Living                      Prior Function            PT Goals (current goals can now be found in the care plan section) Acute Rehab PT Goals Patient Stated Goal: return to independence PT Goal Formulation: With patient Time For Goal Achievement: 08/13/16 Potential to Achieve Goals: Good Progress towards PT goals: Progressing toward goals    Frequency    7X/week      PT Plan Current plan remains appropriate    Co-evaluation             End of Session Equipment Utilized During Treatment: Gait belt Activity Tolerance: Patient tolerated treatment well;Patient limited by pain;Patient limited by fatigue Patient left: in chair;with call bell/phone within reach;with chair alarm set     Time: [id number] PT Time Calculation (min) (ACUTE ONLY): 25 min  Charges:  $Gait Training: 23-37 mins                    G Codes:      Norfleet Capers 2016-08-22, 5:01 PM

## 2016-08-10 NOTE — Care Management Note (Signed)
Case Management Note  Patient Details  Name: Vincent [name] MRN: [id number] Date of Birth: 1951-08-30  Subjective/Objective:        65 yo admitted for RIGHT TOTAL KNEE ARTHROPLASTY WITH COMPUTER NAVIGATION on 08/09/2016            Action/Plan: From home with spouse. Choice offered for [facility] services and [facility] chosen. [facility] rep aware. Pt has a RW at home and needs a 3in1. Order received and [facility] DME rep informed of need for 3in1. No other CM needs communicated.  Expected Discharge Date:  08/11/16               Expected Discharge Plan:  [place]  In-House Referral:     Discharge planning Services  CM Consult  Post Acute Care Choice:  Home Health Choice offered to:  Patient, Spouse  DME Arranged:  3-N-1 DME Agency:     HH Arranged:  [place]:  [place] (now [facility])  Status of Service:  In process, will continue to follow  If discussed at Long Length of Stay Meetings, dates discussed:    Additional CommentsLynnell Catalan, RN 08/10/2016, 11:26 AM [phone]

## 2016-08-10 NOTE — Discharge Summary (Signed)
Physician Discharge Summary  Patient ID: ASAUN BRESTER MRN: [id number] DOB/AGE: [birth year] 65 y.o.  Admit date: 08/09/2016 Discharge date: 08/13/2016  Admission Diagnoses:  Osteoarthritis of right knee  Discharge Diagnoses:  Principal Problem:   Osteoarthritis of right knee Active Problems:   SOB (shortness of breath)   Tachycardia   Low grade fever   Past Medical History:  Diagnosis Date  . Acute MI   . Anxiety   . Coronary artery disease   . Diabetes mellitus without complication ([place])   . Diverticulosis    mild  . Hemorrhoids   . HOH (hard of hearing)   . Hx of colonic polyps   . Hyperlipidemia   . Hypertension   . Left ventricular dysfunction    hx of    Surgeries: Procedure(s): RIGHT TOTAL KNEE ARTHROPLASTY WITH COMPUTER NAVIGATION on 08/09/2016   Consultants (if any):   Discharged Condition: Improved  Hospital Course: Vincent [name] is an 65 y.o. male who was admitted 08/09/2016 with a diagnosis of Osteoarthritis of right knee and went to the operating room on 08/09/2016 and underwent the above named procedures.    He was given perioperative antibiotics:  Anti-infectives    Start     Dose/Rate Route Frequency Ordered Stop   08/10/16 0300  vancomycin (VANCOCIN) IVPB 1000 mg/200 mL premix     1,000 mg 200 mL/hr over 60 Minutes Intravenous Every 12 hours 08/09/16 2020 08/10/16 0333   08/09/16 0600  vancomycin (VANCOCIN) 1,500 mg in sodium chloride 0.9 % 500 mL IVPB     1,500 mg 250 mL/hr over 120 Minutes Intravenous On call to O.R. 08/08/16 1329 08/09/16 1707    .  He was given sequential compression devices, early ambulation, and ASA for DVT prophylaxis.  On POD#3 he developed SOB and tachycardia. Hospitalist was consulted. CT chest for PE was negative. His lasix dose was adjusted, and he improved.   He benefited maximally from the hospital stay and there were no complications.    Recent vital signs:  Vitals:   08/12/16 2137 08/13/16 0538  BP: 92/60  112/62  Pulse: 86 99  Resp: 18 18  Temp: 98.9 F (37.2 C) 99.1 F (37.3 C)    Recent laboratory studies:  Lab Results  Component Value Date   HGB 10.6 (L) 08/12/2016   HGB 10.7 (L) 08/11/2016   HGB 11.4 (L) 08/10/2016   Lab Results  Component Value Date   WBC 10.1 08/12/2016   PLT 171 08/12/2016   Lab Results  Component Value Date   INR 0.99 05/12/2016   Lab Results  Component Value Date   NA 135 08/10/2016   K 4.3 08/10/2016   CL 104 08/10/2016   CO2 23 08/10/2016   BUN 13 08/10/2016   CREATININE 0.90 08/10/2016   GLUCOSE 229 (H) 08/10/2016    Discharge Medications:     Medication List    STOP taking these medications   aspirin 81 MG tablet Replaced by:  aspirin 81 MG chewable tablet   BC HEADACHE POWDER PO   HYDROcodone-acetaminophen 5-325 MG tablet Commonly known as:  NORCO/VICODIN   traMADol 50 MG tablet Commonly known as:  ULTRAM     TAKE these medications   aspirin 81 MG chewable tablet Chew 1 tablet (81 mg total) by mouth 2 (two) times daily. Replaces:  aspirin 81 MG tablet   docusate sodium 100 MG capsule Commonly known as:  COLACE Take 1 capsule (100 mg total) by mouth 2 (two) times daily.  furosemide 40 MG tablet Commonly known as:  LASIX Take 40 mg by mouth 2 (two) times daily.   isosorbide mononitrate 30 MG 24 hr tablet Commonly known as:  IMDUR Take 1 tablet (30 mg total) by mouth daily.   LORazepam 1 MG tablet Commonly known as:  ATIVAN Take 1 mg by mouth every 8 (eight) hours as needed for anxiety. AS NEEDED FOR ANXIETY   meloxicam 7.5 MG tablet Commonly known as:  MOBIC Take 1 tablet (7.5 mg total) by mouth daily. What changed:  when to take this  reasons to take this   metFORMIN 1000 MG tablet Commonly known as:  GLUCOPHAGE Take 1,000 mg by mouth 2 (two) times daily.   methocarbamol 500 MG tablet Commonly known as:  ROBAXIN Take 1 tablet (500 mg total) by mouth every 6 (six) hours as needed for muscle spasms.    metoprolol 50 MG tablet Commonly known as:  LOPRESSOR TAKE 1 TABLET BY MOUTH TWICE DAILY   nitroGLYCERIN 0.4 MG SL tablet Commonly known as:  NITROSTAT Place 1 tablet (0.4 mg total) under the tongue every 5 (five) minutes as needed for chest pain.   ondansetron 4 MG tablet Commonly known as:  ZOFRAN Take 1 tablet (4 mg total) by mouth every 6 (six) hours as needed for nausea.   oxyCODONE 5 MG immediate release tablet Commonly known as:  Oxy IR/ROXICODONE Take 1-2 tablets (5-10 mg total) by mouth every 3 (three) hours as needed for breakthrough pain.   pantoprazole 40 MG tablet Commonly known as:  PROTONIX Take 1 tablet (40 mg total) by mouth daily.   ramipril 2.5 MG tablet Commonly known as:  ALTACE Take 2.5 mg by mouth daily.   senna 8.6 MG Tabs tablet Commonly known as:  SENOKOT Take 2 tablets (17.2 mg total) by mouth at bedtime.   simvastatin 40 MG tablet Commonly known as:  ZOCOR TAKE 1 TABLET BY MOUTH AT BEDTIME   sitaGLIPtin 100 MG tablet Commonly known as:  JANUVIA Take 100 mg by mouth every evening.       Diagnostic Studies: Ct Angio Chest Pe W Or Wo Contrast  Result Date: 08/12/2016 CLINICAL DATA:  Shortness of breath and tachycardia. Three days postop from right knee arthroplasty. EXAM: CT ANGIOGRAPHY CHEST WITH CONTRAST TECHNIQUE: Multidetector CT imaging of the chest was performed using the standard protocol during bolus administration of intravenous contrast. Multiplanar CT image reconstructions and MIPs were obtained to evaluate the vascular anatomy. CONTRAST:  100 mL Isovue 370 COMPARISON:  None. FINDINGS: Cardiovascular: Satisfactory opacification of pulmonary arteries noted, and no pulmonary emboli identified. No evidence of thoracic aortic dissection or aneurysm. Aortic atherosclerosis. Coronary artery calcification seen with stent in the proximal LAD. Heart size is normal. Mediastinum/Nodes: No masses or pathologically enlarged lymph nodes identified.  Lungs/Pleura: No pulmonary mass, infiltrate, or effusion. Upper abdomen: No acute findings. Musculoskeletal: No suspicious bone lesions or other significant abnormality identified. IMPRESSION: No evidence of pulmonary embolism or other acute findings. Aortic and coronary artery atherosclerosis. Electronically Signed   By: Earle Gell M.D.   On: 08/12/2016 15:29   Dg Knee Right Port  Result Date: 08/09/2016 CLINICAL DATA:  Status post right knee arthroplasty. EXAM: PORTABLE RIGHT KNEE - 1-2 VIEW COMPARISON:  03/11/2014. FINDINGS: Interval right total knee prosthesis in satisfactory position and alignment. No fracture or dislocation is seen. IMPRESSION: Satisfactory postoperative appearance of a right total knee prosthesis. Electronically Signed   By: Claudie Revering M.D.   On: 08/09/2016 20:05  Disposition: 01-Home or Self Care  Discharge Instructions    Call MD / Call 911    Complete by:  As directed    If you experience chest pain or shortness of breath, CALL 911 and be transported to the hospital emergency room.  If you develope a fever above 101 F, pus (white drainage) or increased drainage or redness at the wound, or calf pain, call your surgeon's office.   Constipation Prevention    Complete by:  As directed    Drink plenty of fluids.  Prune juice may be helpful.  You may use a stool softener, such as Colace (over the counter) 100 mg twice a day.  Use MiraLax (over the counter) for constipation as needed.   Diet - low sodium heart healthy    Complete by:  As directed    Do not put a pillow under the knee. Place it under the heel.    Complete by:  As directed    Driving restrictions    Complete by:  As directed    No driving for 6 weeks   Increase activity slowly as tolerated    Complete by:  As directed    Lifting restrictions    Complete by:  As directed    No lifting for 6 weeks   TED hose    Complete by:  As directed    Use stockings (TED hose) for 2 weeks on both leg(s).  You  may remove them at night for sleeping.      Follow-up Information    Kaede Clendenen, Horald Pollen, MD. Schedule an appointment as soon as possible for a visit in 2 week(s).   Specialty:  Orthopedic Surgery Why:  For wound re-check Contact information: [address]. [address] [place] [address] [phone]        [place] .   Specialty:  [place] Why:  Home Health Physical Therapy Contact information: [place] [address] [phone]            Signed: Elie Goody 08/13/2016, 7:13 AM

## 2016-08-10 NOTE — Progress Notes (Signed)
Physical Therapy Treatment Patient Details Name: Vincent [name] MRN: [id number] DOB: [birth year] Today's Date: 08/10/2016    History of Present Illness s/p R TKA    PT Comments    Pt s/p R TKR presents with decreased R LE strength/ROM and post op pain limiting functional mobility.  Pt should progress to dc home with family assist and HHPT follow up.  Follow Up Recommendations  Home health PT     Equipment Recommendations  None recommended by PT    Recommendations for Other Services OT consult     Precautions / Restrictions Precautions Precautions: Knee;Fall Restrictions Weight Bearing Restrictions: No Other Position/Activity Restrictions: WBAT    Mobility  Bed Mobility Overal bed mobility: Needs Assistance Bed Mobility: Supine to Sit     Supine to sit: Min guard     General bed mobility comments: increased time and cues for sequence and use of L LE to self assist  Transfers Overall transfer level: Needs assistance Equipment used: Rolling walker (2 wheeled) Transfers: Sit to/from Stand Sit to Stand: Min guard         General transfer comment: close guard for safety. Pt able to transition to standing without physical assistance.  Cues for LE management and use of UEs to self assist  Ambulation/Gait Ambulation/Gait assistance: Min assist Ambulation Distance (Feet): 13 Feet Assistive device: Rolling walker (2 wheeled) Gait Pattern/deviations: Step-to pattern;Decreased step length - right;Decreased step length - left;Shuffle;Trunk flexed Gait velocity: decr Gait velocity interpretation: Below normal speed for age/gender General Gait Details: cues for sequence, posture and position from [employer]            Wheelchair Mobility    Modified Rankin (Stroke Patients Only)       Balance                                    Cognition Arousal/Alertness: Awake/alert Behavior During Therapy: Impulsive Overall Cognitive Status: Within  Functional Limits for tasks assessed                      Exercises Total Joint Exercises Ankle Circles/Pumps: AROM;Both;15 reps;Supine Quad Sets: AROM;Both;10 reps;Supine Heel Slides: AAROM;15 reps;Right;Supine Straight Leg Raises: AAROM;AROM;Right;Supine;10 reps    General Comments        Pertinent Vitals/Pain Pain Assessment: 0-10 Pain Score: 8  Pain Location: R knee Pain Descriptors / Indicators: Aching;Sore Pain Intervention(s): Limited activity within patient's tolerance;Monitored during session;Premedicated before session;Ice applied    Home Living Family/patient expects to be discharged to:: Private residence Living Arrangements: Spouse/significant other Available Help at Discharge: Family Type of Home: House Home Access: Stairs to enter Entrance Stairs-Rails: Right Home Layout: One level Home Equipment: Environmental consultant - 2 wheels;Cane - single point Additional Comments: family has a tub seat he can borrow    Prior Function Level of Independence: Independent          PT Goals (current goals can now be found in the care plan section) Acute Rehab PT Goals Patient Stated Goal: return to independence PT Goal Formulation: With patient Time For Goal Achievement: 08/13/16 Potential to Achieve Goals: Good    Frequency    7X/week      PT Plan      Co-evaluation             End of Session Equipment Utilized During Treatment: Gait belt Activity Tolerance: Patient tolerated treatment well;Patient limited by  pain Patient left: in chair;with call bell/phone within reach;with chair alarm set     Time: 0918-1000 PT Time Calculation (min) (ACUTE ONLY): 42 min  Charges:  $Gait Training: 8-22 mins $Therapeutic Exercise: 8-22 mins                    G Codes:      Shaylynn Nulty 09/05/2016, 12:59 PM

## 2016-08-11 ENCOUNTER — Encounter (HOSPITAL_COMMUNITY): Payer: Self-pay | Admitting: *Deleted

## 2016-08-11 LAB — CBC
HCT: 31 % — ABNORMAL LOW (ref 39.0–52.0)
Hemoglobin: 10.7 g/dL — ABNORMAL LOW (ref 13.0–17.0)
MCH: 31.3 pg (ref 26.0–34.0)
MCHC: 34.5 g/dL (ref 30.0–36.0)
MCV: 90.6 fL (ref 78.0–100.0)
Platelets: 180 10*3/uL (ref 150–400)
RBC: 3.42 MIL/uL — ABNORMAL LOW (ref 4.22–5.81)
RDW: 13.9 % (ref 11.5–15.5)
WBC: 11.8 10*3/uL — ABNORMAL HIGH (ref 4.0–10.5)

## 2016-08-11 LAB — GLUCOSE, CAPILLARY
Glucose-Capillary: 203 mg/dL — ABNORMAL HIGH (ref 65–99)
Glucose-Capillary: 224 mg/dL — ABNORMAL HIGH (ref 65–99)
Glucose-Capillary: 143 mg/dL — ABNORMAL HIGH (ref 65–99)

## 2016-08-11 NOTE — Progress Notes (Signed)
Physical Therapy Treatment Patient Details Name: Vincent [name] MRN: [id number] DOB: 08/01/1951 Today's Date: 08/11/2016    History of Present Illness s/p R TKA    PT Comments    Pt struggling with WB sufficiently on R LE to ambulate.  Applied KI with marginal improvement.  Follow Up Recommendations  Home health PT     Equipment Recommendations  None recommended by PT    Recommendations for Other Services OT consult     Precautions / Restrictions Precautions Precautions: Knee;Fall Required Braces or Orthoses: Knee Immobilizer - Right Knee Immobilizer - Right: Discontinue once straight leg raise with < 10 degree lag Restrictions Weight Bearing Restrictions: No Other Position/Activity Restrictions: WBAT    Mobility  Bed Mobility               General bed mobility comments: NT - OOB with nursing and requests up in chair for dinner  Transfers Overall transfer level: Needs assistance Equipment used: Rolling walker (2 wheeled) Transfers: Sit to/from Stand Sit to Stand: Min assist         General transfer comment: Increased time and cues for correct LE management and use of UEs to self assist  Ambulation/Gait Ambulation/Gait assistance: Min assist;Mod assist;+2 safety/equipment Ambulation Distance (Feet): 14 Feet (and 3) Assistive device: Rolling walker (2 wheeled) Gait Pattern/deviations: Step-to pattern;Decreased step length - right;Decreased step length - left;Shuffle;Trunk flexed     General Gait Details: cues for sequence, posture and position from RW;  Initial attempt sans KI but pt toleraing min WB on R LE.  Reattempted with KI in place but with marginal improvement   Stairs            Wheelchair Mobility    Modified Rankin (Stroke Patients Only)       Balance                                    Cognition Arousal/Alertness: Awake/alert Behavior During Therapy: Impulsive Overall Cognitive Status: Within Functional Limits  for tasks assessed                      Exercises      General Comments        Pertinent Vitals/Pain Pain Assessment: 0-10 Pain Score: 8  Pain Location: R knee Pain Descriptors / Indicators: Aching;Sore;Tightness Pain Intervention(s): Limited activity within patient's tolerance;Monitored during session;Premedicated before session;Ice applied;Patient requesting pain meds-RN notified    Home Living                      Prior Function            PT Goals (current goals can now be found in the care plan section) Acute Rehab PT Goals Patient Stated Goal: return to independence PT Goal Formulation: With patient Time For Goal Achievement: 08/13/16 Potential to Achieve Goals: Good Progress towards PT goals: Progressing toward goals    Frequency    7X/week      PT Plan Current plan remains appropriate    Co-evaluation             End of Session Equipment Utilized During Treatment: Gait belt Activity Tolerance: Patient limited by fatigue;Patient limited by pain Patient left: in chair;with call bell/phone within reach;with chair alarm set;with family/visitor present     Time: [id number] PT Time Calculation (min) (ACUTE ONLY): 42 min  Charges:  $Gait Training: 23-37 mins  G Codes:      Jovannie Ulibarri 08-19-16, 3:56 PM

## 2016-08-11 NOTE — Progress Notes (Signed)
Physical Therapy Treatment Patient Details Name: Vincent [name] MRN: [id number] DOB: 20-Feb-1951 Today's Date: Aug 26, 2016    History of Present Illness s/p R TKA    PT Comments    Therex performed.  Pt requesting rest break following before attempting ambulation.  Follow Up Recommendations  Home health PT     Equipment Recommendations  None recommended by PT    Recommendations for Other Services OT consult     Precautions / Restrictions Precautions Precautions: Knee;Fall Restrictions Weight Bearing Restrictions: No Other Position/Activity Restrictions: WBAT    Mobility  Bed Mobility               General bed mobility comments: NT - OOB with nursing and requests up in chair for dinner  Transfers Overall transfer level: Needs assistance Equipment used: Rolling walker (2 wheeled) Transfers: Sit to/from Stand Sit to Stand: Min assist         General transfer comment: close guard for safety. Pt able to transition to standing without physical assistance.  Cues for LE management and use of UEs to self assist  Ambulation/Gait                 Stairs            Wheelchair Mobility    Modified Rankin (Stroke Patients Only)       Balance                                    Cognition Arousal/Alertness: Awake/alert Behavior During Therapy: Impulsive Overall Cognitive Status: Within Functional Limits for tasks assessed                      Exercises Total Joint Exercises Ankle Circles/Pumps: AROM;Both;15 reps;Supine Quad Sets: AROM;Both;10 reps;Supine Heel Slides: AAROM;15 reps;Right;Supine Straight Leg Raises: AAROM;Right;Supine;10 reps Goniometric ROM: AAROM at R knee -10 - 50    General Comments        Pertinent Vitals/Pain Pain Assessment: 0-10 Pain Score: 7  Pain Location: R knee Pain Descriptors / Indicators: Aching;Sore Pain Intervention(s): Limited activity within patient's tolerance;Monitored during  session;Premedicated before session;Ice applied    Home Living                      Prior Function            PT Goals (current goals can now be found in the care plan section) Acute Rehab PT Goals Patient Stated Goal: return to independence PT Goal Formulation: With patient Time For Goal Achievement: 08/13/16 Potential to Achieve Goals: Good Progress towards PT goals: Progressing toward goals    Frequency    7X/week      PT Plan Current plan remains appropriate    Co-evaluation             End of Session Equipment Utilized During Treatment: Gait belt Activity Tolerance: Patient limited by fatigue;Patient limited by pain Patient left: in chair;with call bell/phone within reach;with chair alarm set     Time: [id number] PT Time Calculation (min) (ACUTE ONLY): 28 min  Charges:  $Therapeutic Exercise: 23-37 mins                    G Codes:      Kashon Kraynak 08/26/16, 2:40 PM

## 2016-08-11 NOTE — Progress Notes (Signed)
Subjective: 2 Days Post-Op Procedure(s) (LRB): RIGHT TOTAL KNEE ARTHROPLASTY WITH COMPUTER NAVIGATION (Right)  Patient reports pain as mild to moderate.  Denies fever, chills, N/V.  Tolerating POs well.  Denies BM, however admits to flatulence.  Reports that he is ready to go home.  Reports concerns over making sure he has his bedside commode upon D/C.  Objective:   VITALS:  Temp:  [98.6 F (37 C)-100.6 F (38.1 C)] 98.6 F (37 C) (11/04 0630) Pulse Rate:  [83-100] 96 (11/04 0524) Resp:  [16-20] 20 (11/04 0524) BP: (95-156)/(40-69) 156/69 (11/04 0524) SpO2:  [97 %] 97 % (11/03 1510)  General: WDWN patient in NAD. Psych:  Appropriate mood and affect. Neuro:  A&O x 3, Moving all extremities, sensation intact to light touch HEENT:  EOMs intact Chest:  Even non-labored respirations Skin:  Dressing C/D/I, no rashes or lesions Extremities: warm/dry, Mild edema, erythema, and echymosis.  No lymphadenopathy. Pulses: Popliteus 2+ MSK:  ROM: full ankle ROM, MMT: patient is able perform quad set, (-) Homan's    LABS  Recent Labs  08/10/16 0417 08/11/16 0448  HGB 11.4* 10.7*  WBC 10.4 11.8*  PLT 186 180    Recent Labs  08/10/16 0417  NA 135  K 4.3  CL 104  CO2 23  BUN 13  CREATININE 0.90  GLUCOSE 229*   No results for input(s): LABPT, INR in the last 72 hours.   Assessment/Plan: 2 Days Post-Op Procedure(s) (LRB): RIGHT TOTAL KNEE ARTHROPLASTY WITH COMPUTER NAVIGATION (Right)  Up with therapy  D/C home today with HHPT WBAT R LE Scripts on chart. ASA for DVT prophylaxis Plan for outpatient post-op visit with Dr. Lyla Glassing.  Mechele Claude, PA-C, [employer] Office:  [phone]

## 2016-08-11 NOTE — Progress Notes (Signed)
Contacted [facility] DME rep for 3n1 and RW for home. Please see previous NCM notes. Jonnie Finner RN CCM Case Mgmt phone [phone]

## 2016-08-11 NOTE — Progress Notes (Signed)
Pt did poorly with PT per their report. Developed nausea & was treated for this. I felt concern re pt's cardiac history & listened to heart sounds. Not certain if I heard a murmur (not in pt's history) & had AC also listen. She stated she heard no adventitious sounds. Notified PA re above. He stated he would cancel discharge for today. Errick Salts, [employer]

## 2016-08-11 NOTE — Progress Notes (Signed)
Received call regarding patient after rounding today.  Patient developed slight fever, nausea, increased pain, and apparently did not work well with PT today.  D/C order cancelled.  Reassess for potential D/C tomorrow.  Dr. Lyla Glassing notified.

## 2016-08-11 NOTE — Progress Notes (Signed)
Physical Therapy Treatment Patient Details Name: Vincent [name] MRN: [id number] DOB: 23-Dec-1950 Today's Date: 08/11/2016    History of Present Illness s/p R TKA    PT Comments    Pt continues to struggle with WB tolerance on R LE and is c/o increased pain in L knee with WB - pt states L knee also needs replaced.  Follow Up Recommendations  Home health PT     Equipment Recommendations  None recommended by PT    Recommendations for Other Services OT consult     Precautions / Restrictions Precautions Precautions: Knee;Fall Required Braces or Orthoses: Knee Immobilizer - Right Knee Immobilizer - Right: Discontinue once straight leg raise with < 10 degree lag Restrictions Weight Bearing Restrictions: No Other Position/Activity Restrictions: WBAT    Mobility  Bed Mobility               General bed mobility comments: NT - OOB with nursing and requests up in chair for dinner  Transfers Overall transfer level: Needs assistance Equipment used: Rolling walker (2 wheeled) Transfers: Sit to/from Stand Sit to Stand: Min assist         General transfer comment: Increased time and cues for correct LE management and use of UEs to self assist  Ambulation/Gait Ambulation/Gait assistance: Min assist;Mod assist;+2 safety/equipment Ambulation Distance (Feet): 5 Feet Assistive device: Rolling walker (2 wheeled) Gait Pattern/deviations: Step-to pattern;Decreased step length - right;Decreased step length - left;Shuffle;Trunk flexed Gait velocity: decr Gait velocity interpretation: Below normal speed for age/gender General Gait Details: cues for sequence, posture and position from RW;  Initial attempt sans KI but pt toleraing min WB on R LE.  Pt struggling to WB on R LE and c/o increasing pain L LE   Stairs            Wheelchair Mobility    Modified Rankin (Stroke Patients Only)       Balance                                    Cognition  Arousal/Alertness: Awake/alert Behavior During Therapy: Impulsive Overall Cognitive Status: Within Functional Limits for tasks assessed                      Exercises      General Comments        Pertinent Vitals/Pain Pain Assessment: 0-10 Pain Score: 8  Pain Location: R knee and 6/10 left knee Pain Descriptors / Indicators: Aching;Grimacing;Guarding;Sore Pain Intervention(s): Limited activity within patient's tolerance;Monitored during session;Premedicated before session;Ice applied    Home Living                      Prior Function            PT Goals (current goals can now be found in the care plan section) Acute Rehab PT Goals Patient Stated Goal: return to independence PT Goal Formulation: With patient Time For Goal Achievement: 08/13/16 Potential to Achieve Goals: Good Progress towards PT goals: Progressing toward goals    Frequency    7X/week      PT Plan Current plan remains appropriate    Co-evaluation             End of Session Equipment Utilized During Treatment: Gait belt Activity Tolerance: Patient limited by fatigue;Patient limited by pain Patient left: in chair;with call bell/phone within reach;with chair alarm set;with family/visitor present  Time: 1410-1434 PT Time Calculation (min) (ACUTE ONLY): 24 min  Charges:  $Gait Training: 8-22 mins                    G Codes:      Markea Ruzich 08/19/16, 4:04 PM

## 2016-08-12 ENCOUNTER — Encounter (HOSPITAL_COMMUNITY): Payer: Self-pay | Admitting: Radiology

## 2016-08-12 ENCOUNTER — Inpatient Hospital Stay (HOSPITAL_COMMUNITY): Payer: BLUE CROSS/BLUE SHIELD

## 2016-08-12 ENCOUNTER — Other Ambulatory Visit: Payer: Self-pay | Admitting: Cardiovascular Disease

## 2016-08-12 DIAGNOSIS — R509 Fever, unspecified: Secondary | ICD-10-CM | POA: Diagnosis present

## 2016-08-12 DIAGNOSIS — R000 Tachycardia, unspecified: Secondary | ICD-10-CM | POA: Diagnosis present

## 2016-08-12 DIAGNOSIS — R0602 Shortness of breath: Secondary | ICD-10-CM | POA: Diagnosis present

## 2016-08-12 LAB — URINALYSIS, ROUTINE W REFLEX MICROSCOPIC
Bilirubin Urine: NEGATIVE
Glucose, UA: NEGATIVE mg/dL
Hgb urine dipstick: NEGATIVE
Ketones, ur: NEGATIVE mg/dL
Leukocytes, UA: NEGATIVE
Nitrite: NEGATIVE
Protein, ur: NEGATIVE mg/dL
Specific Gravity, Urine: 1.022 (ref 1.005–1.030)
pH: 6 (ref 5.0–8.0)

## 2016-08-12 LAB — CBC
HCT: 31.2 % — ABNORMAL LOW (ref 39.0–52.0)
Hemoglobin: 10.6 g/dL — ABNORMAL LOW (ref 13.0–17.0)
MCH: 30.4 pg (ref 26.0–34.0)
MCHC: 34 g/dL (ref 30.0–36.0)
MCV: 89.4 fL (ref 78.0–100.0)
Platelets: 171 10*3/uL (ref 150–400)
RBC: 3.49 MIL/uL — ABNORMAL LOW (ref 4.22–5.81)
RDW: 13.8 % (ref 11.5–15.5)
WBC: 10.1 10*3/uL (ref 4.0–10.5)

## 2016-08-12 LAB — BRAIN NATRIURETIC PEPTIDE: B Natriuretic Peptide: 100.5 pg/mL — ABNORMAL HIGH (ref 0.0–100.0)

## 2016-08-12 LAB — GLUCOSE, CAPILLARY
Glucose-Capillary: 156 mg/dL — ABNORMAL HIGH (ref 65–99)
Glucose-Capillary: 167 mg/dL — ABNORMAL HIGH (ref 65–99)
Glucose-Capillary: 153 mg/dL — ABNORMAL HIGH (ref 65–99)

## 2016-08-12 LAB — TROPONIN I
Troponin I: <0.03 ng/mL (ref <0.03)
Troponin I: <0.03 ng/mL (ref <0.03)

## 2016-08-12 IMAGING — CT CT ANGIO CHEST
2 of 7 series · 17 of 46 positions shown · IV contrast (APPLIED)
Comparison: None.

CLINICAL DATA: Shortness of breath and tachycardia. Three days
postop from right knee arthroplasty.

EXAM:
CT ANGIOGRAPHY CHEST WITH CONTRAST
TECHNIQUE: Multidetector CT imaging of the chest was performed using the
standard protocol during bolus administration of intravenous
contrast. Multiplanar CT image reconstructions and MIPs were
obtained to evaluate the vascular anatomy.
CONTRAST:  100 mL Isovue 370

[Series 6: thins · axial · 0.90mm/px · z∈[-193,+15]mm · 15 of 234 slices shown]
[im 13/234  lung]
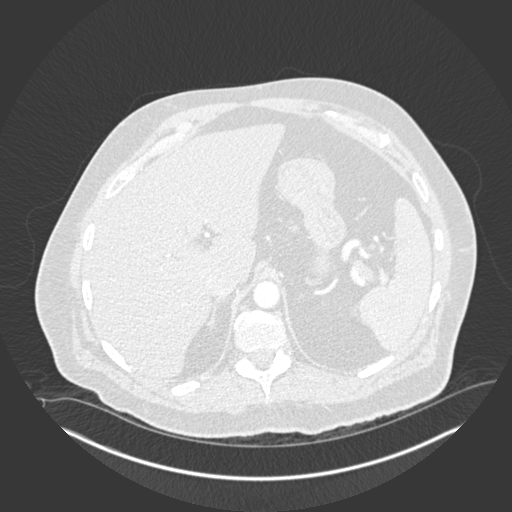
[im 26/234  soft-tissue]
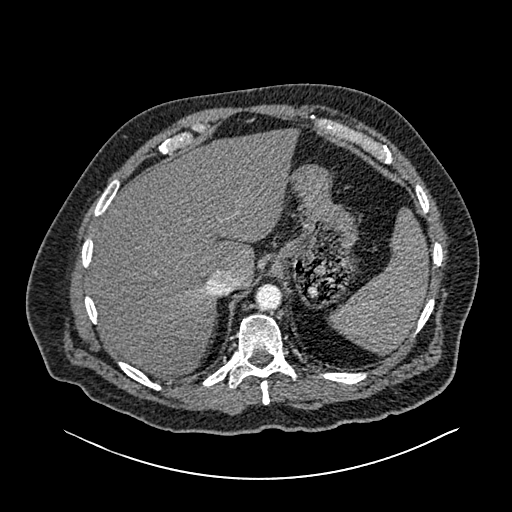
[im 39/234  lung]
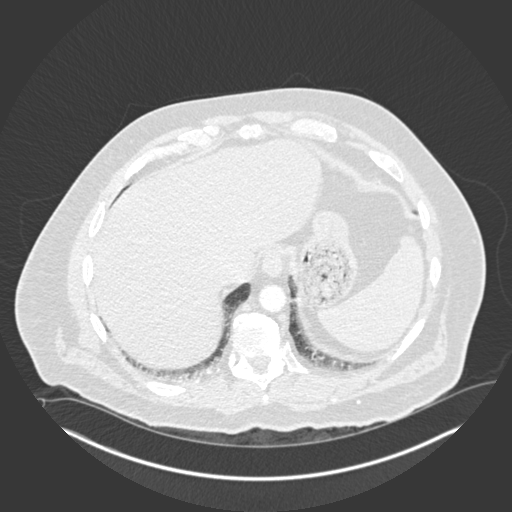
[im 52/234  soft-tissue]
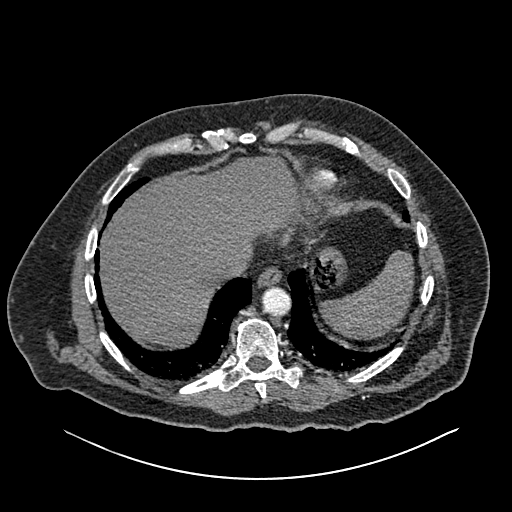
[im 78/234  lung]
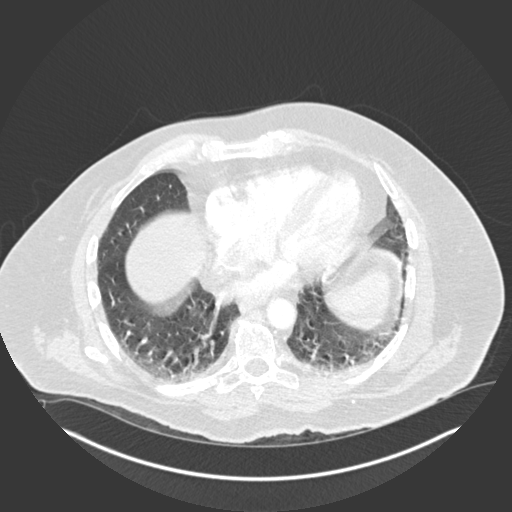
[im 91/234  soft-tissue]
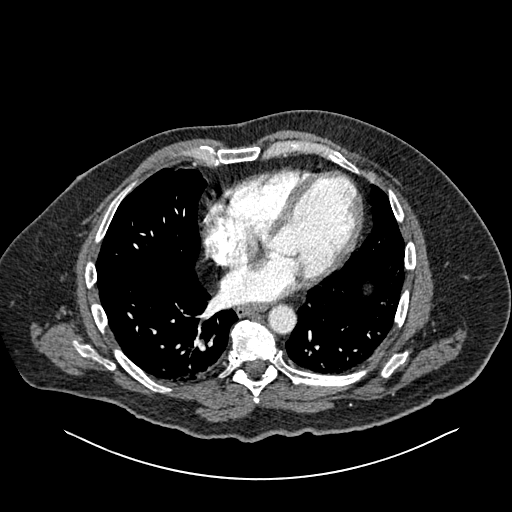
[im 104/234  lung]
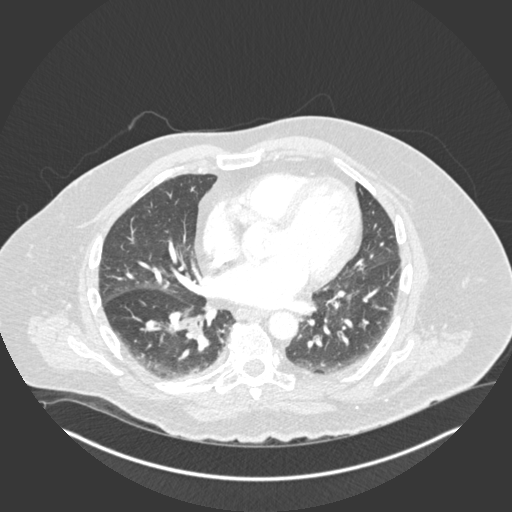
[im 117/234  soft-tissue]
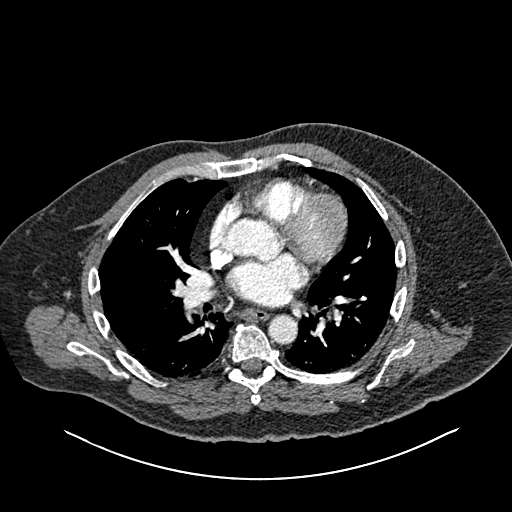
[im 130/234  lung]
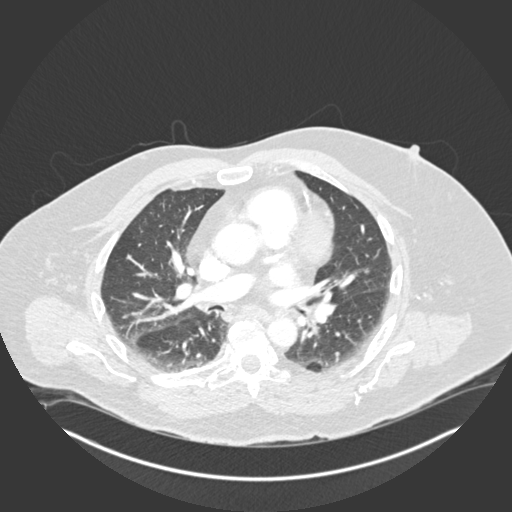
[im 143/234  soft-tissue]
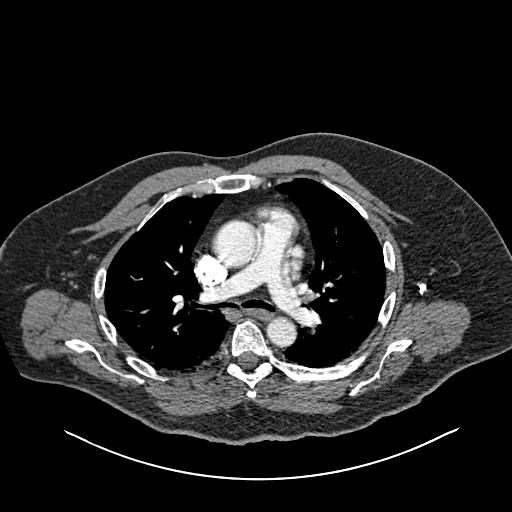
[im 156/234  lung]
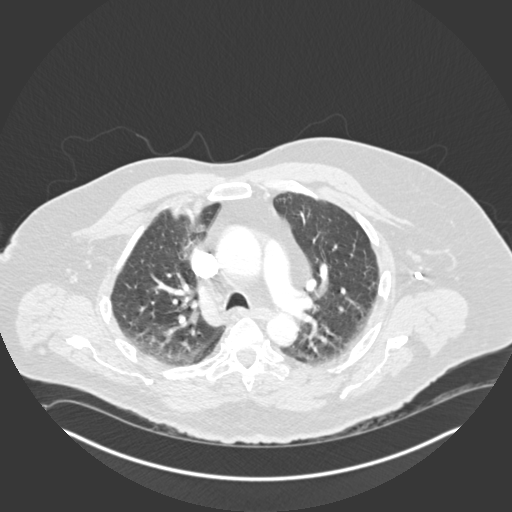
[im 182/234  soft-tissue]
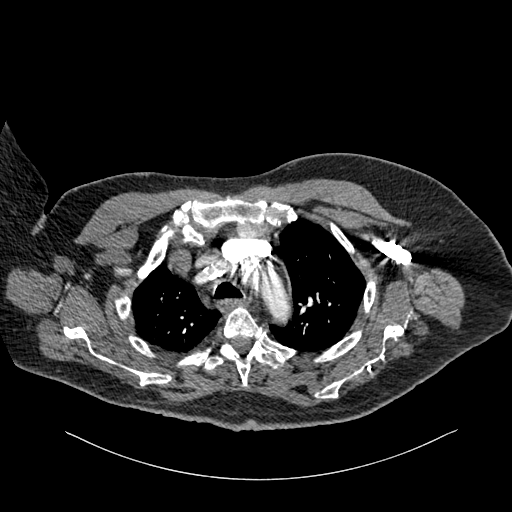
[im 195/234  lung]
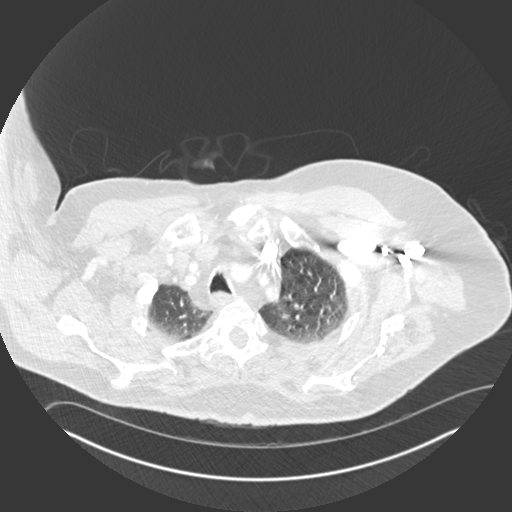
[im 208/234  soft-tissue]
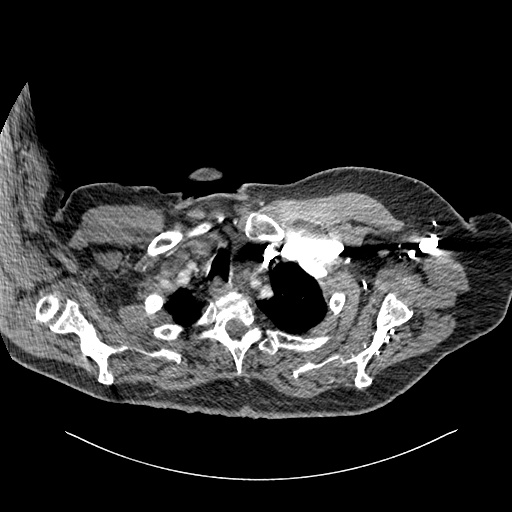
[im 221/234  lung]
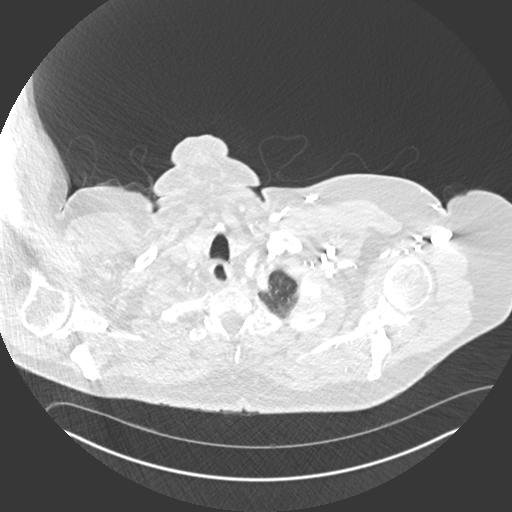

[Series 8: coronal mpr · coronal · 0.49mm/px · 2 of 114 slices shown]
[im 38/114  soft-tissue]
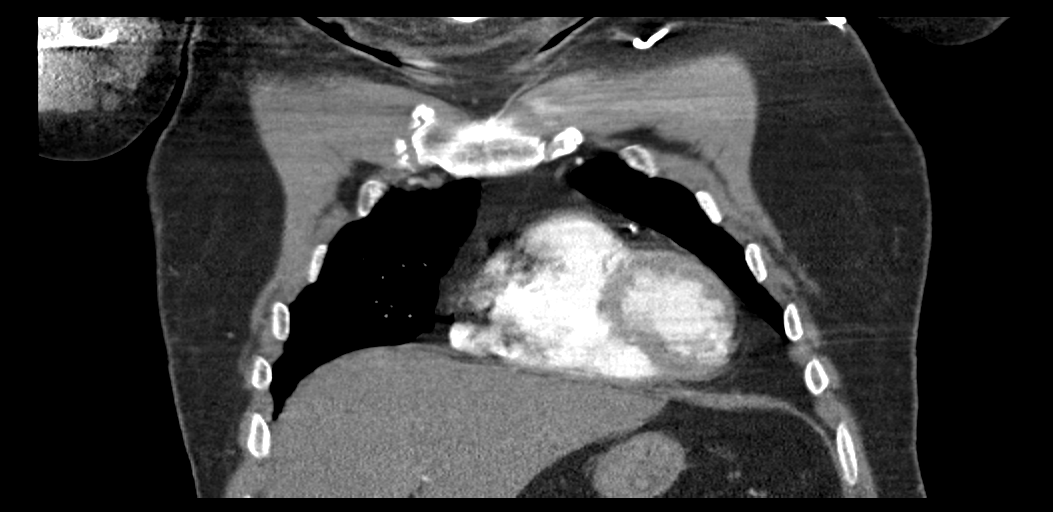
[im 76/114  soft-tissue]
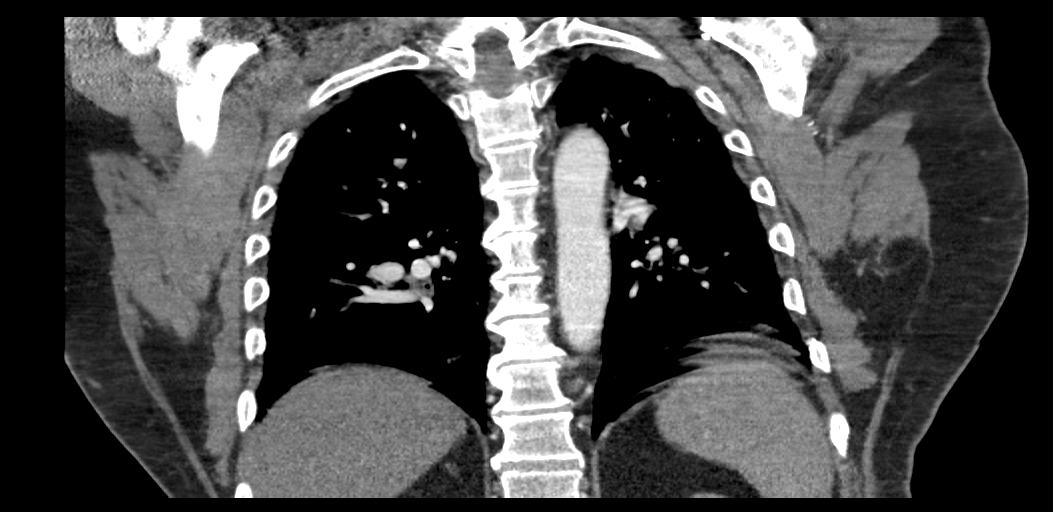

[17 of 46 positions shown; findings below may reference images not displayed]

FINDINGS: Cardiovascular: Satisfactory opacification of pulmonary arteries
noted, and no pulmonary emboli identified. No evidence of thoracic
aortic dissection or aneurysm. Aortic atherosclerosis. Coronary
artery calcification seen with stent in the proximal LAD. Heart size
is normal.

Mediastinum/Nodes: No masses or pathologically enlarged lymph nodes
identified.

Lungs/Pleura: No pulmonary mass, infiltrate, or effusion.

Upper abdomen: No acute findings.

Musculoskeletal: No suspicious bone lesions or other significant
abnormality identified.
IMPRESSION: No evidence of pulmonary embolism or other acute findings.

Aortic and coronary artery atherosclerosis.

## 2016-08-12 MED ORDER — IOPAMIDOL (ISOVUE-370) INJECTION 76%
100.0000 mL | Freq: Once | INTRAVENOUS | Status: AC | PRN
  Administered 2016-08-12: 100 mL via INTRAVENOUS

## 2016-08-12 MED ORDER — FUROSEMIDE 10 MG/ML IJ SOLN
40.0000 mg | Freq: Once | INTRAMUSCULAR | Status: AC
  Administered 2016-08-12: 40 mg via INTRAVENOUS
  Filled 2016-08-12 (×2): qty 4

## 2016-08-12 NOTE — Progress Notes (Signed)
PA returned page & will get medical consult. Also ordered EKG. Vincent [name], [employer]

## 2016-08-12 NOTE — Progress Notes (Signed)
   08/12/16 1700  PT Visit Information  Last PT Received On 08/12/16  Assistance Needed +1  History of Present Illness s/p R TKA  Subjective Data  Subjective I'm feeling better, I want to try to walk  Patient Stated Goal return to independence  Precautions  Precautions Knee;Fall  Required Braces or Orthoses Knee Immobilizer - Right  Knee Immobilizer - Right Discontinue once straight leg raise with < 10 degree lag  Restrictions  Weight Bearing Restrictions No  Other Position/Activity Restrictions WBAT  Pain Assessment  Pain Assessment 0-10  Pain Score 7  Pain Descriptors / Indicators Discomfort;Grimacing;Moaning;Constant  Pain Intervention(s) Limited activity within patient's tolerance;Monitored during session;Premedicated before session;Repositioned  Cognition  Arousal/Alertness Awake/alert  Behavior During Therapy Impulsive;Restless  Area of Impairment Following commands;Safety/judgement;Attention  Following Commands Follows one step commands inconsistently  Safety/Judgement Decreased awareness of safety  General Comments pt talks excessively, does not adhere to therapist's advice and requires frequent redirection to task  Bed Mobility  Overal bed mobility Needs Assistance  Bed Mobility Sit to Supine  Sit to supine Min guard  General bed mobility comments cues for safety  Transfers  Overall transfer level Needs assistance  Equipment used Rolling walker (2 wheeled)  Transfers Sit to/from [employer] Transfers  Sit to Stand Min assist  Stand pivot transfers Min assist  General transfer comment Increased time and cues for correct LE management and use of UEs to self assist  Ambulation/Gait  General Gait Details unable d/t pain  Balance  Overall balance assessment Needs assistance  Standing balance support Bilateral upper extremity supported;During functional activity  Standing balance-Leahy Scale Poor  Standing balance comment reliant on UEs  Total Joint Exercises   Ankle Circles/Pumps (unable d/t pain)  PT - End of Session  Equipment Utilized During Treatment Gait belt  Activity Tolerance Patient limited by pain  Patient left in bed;with call bell/phone within reach;with bed alarm set  Nurse Communication Mobility status  PT - Assessment/Plan  PT Plan Current plan remains appropriate  PT Frequency (ACUTE ONLY) 7X/week  Follow Up Recommendations Home health PT;SNF (if continued slow progress)  PT equipment None recommended by PT  PT Goal Progression  Progress towards PT goals Progressing toward goals (slowly, limited by pain)  Acute Rehab PT Goals  PT Goal Formulation With patient  Time For Goal Achievement 08/15/16  Potential to Achieve Goals Good  PT Time Calculation  PT Start Time (ACUTE ONLY) 1355  PT Stop Time (ACUTE ONLY) 1421  PT Time Calculation (min) (ACUTE ONLY) 26 min  PT General Charges  $$ ACUTE PT VISIT 1 Procedure  PT Treatments  $Therapeutic Activity 23-37 mins

## 2016-08-12 NOTE — Progress Notes (Addendum)
Pt up with PT & became "weak" & had to sit in chair to avoid falling. Also was diaphoretic and very SOB. VSS w/pulse rate 113. Did not have LOC. Will notify PA. Farida Mcreynolds, [employer]

## 2016-08-12 NOTE — Progress Notes (Signed)
Physical Therapy Treatment Patient Details Name: Vincent [name] MRN: [id number] DOB: June 01, 1951 Today's Date: 08/12/2016    History of Present Illness s/p R TKA    PT Comments    Pt with extremely slow progress; became weak/dyspneic/diaphoretic after amb 8' this am; RN present; will  attempt PT again later today; pt may need SNF if continues to progress slowly; He is unsafe with mobility, noncompliant with PT advice and unable to attempt stairs today d/t above issues; If unable to perform stairs tomorrow and does not choose SNF--may need ambulance transport home; Pt wife does not feel she can assist him with stairs or other mobility  Follow Up Recommendations  Home health PT;SNF (vs --if doesn't progress)     Equipment Recommendations  None recommended by PT    Recommendations for Other Services       Precautions / Restrictions Precautions Precautions: Knee;Fall Required Braces or Orthoses: Knee Immobilizer - Right Knee Immobilizer - Right: Discontinue once straight leg raise with < 10 degree lag Restrictions Weight Bearing Restrictions: No Other Position/Activity Restrictions: WBAT    Mobility  Bed Mobility Overal bed mobility: Needs Assistance Bed Mobility: Supine to Sit     Supine to sit: Min guard     General bed mobility comments: multi-modal cues for technique, safety, self assist; pt reqires an sxcessive amount of time to transition to sitting  Transfers Overall transfer level: Needs assistance Equipment used: Rolling walker (2 wheeled) Transfers: Sit to/from Stand Sit to Stand: Min assist;From elevated surface         General transfer comment: Increased time and cues for correct LE management and use of UEs to self assist  Ambulation/Gait Ambulation/Gait assistance: Min assist;+2 safety/equipment;Mod assist Ambulation Distance (Feet): 8 Feet Assistive device: Rolling walker (2 wheeled) Gait Pattern/deviations: Step-to pattern;Trunk flexed;Decreased  step length - right;Decreased step length - left;Antalgic   Gait velocity interpretation: Below normal speed for age/gender General Gait Details: cues for sequence, posture and position from RW;  pt with c/o severe weakness, became diaphoretic and dyspneic; RN & NT  present, see flow sheets for VS   Stairs            Wheelchair Mobility    Modified Rankin (Stroke Patients Only)       Balance                                    Cognition Arousal/Alertness: Awake/alert Behavior During Therapy: Impulsive;Restless   Area of Impairment: Following commands;Safety/judgement;Attention   Current Attention Level: Sustained   Following Commands: Follows one step commands inconsistently Safety/Judgement: Decreased awareness of safety     General Comments: pt talks excessively, does not adhere to therapist's advice and requires frequent redirection to task    Exercises      General Comments        Pertinent Vitals/Pain Pain Assessment: 0-10 Pain Score: 7  Pain Descriptors / Indicators: Discomfort;Grimacing Pain Intervention(s): Limited activity within patient's tolerance;Monitored during session;Premedicated before session;Repositioned    Home Living                      Prior Function            PT Goals (current goals can now be found in the care plan section) Acute Rehab PT Goals Patient Stated Goal: return to independence PT Goal Formulation: With patient Time For Goal Achievement: 08/15/16 Potential to Achieve Goals:  Good Progress towards PT goals: Progressing toward goals    Frequency    7X/week      PT Plan Current plan remains appropriate    Co-evaluation             End of Session Equipment Utilized During Treatment: Gait belt Activity Tolerance: Patient limited by fatigue;Patient limited by pain Patient left: in chair;with call bell/phone within reach;with chair alarm set;with family/visitor present     Time:  1015-1050 PT Time Calculation (min) (ACUTE ONLY): 35 min  Charges:  $Therapeutic Activity: 8-22 mins                    G Codes:      Vincent [name] 08-23-16, 1:29 PM

## 2016-08-12 NOTE — Consult Note (Addendum)
Consult note   JAXEN MCATEER [id number] DOB: 28-Apr-1951 DOA: 08/09/2016  PCP: Marton Redwood, MD  Reason for consult: Shortness of breath and tachycardia  Consulting M.D.: [place]  HPI: HURSCHEL ISHMAEL is a 65 y.o. male with medical history significant of CAD status post PCI in 2007 came into the hospital for elective right knee arthroplasty which was done on 08/09/2016. Patient had uneventful postoperative stay, was about to be discharged today but he developed some shortness of breath and tachycardia while he was ambulating with PT.  ED Course:  Vitals: Heart rate 113 respiratory 26 Labs: No BMP earlier today. Imaging: CT scan pending Interventions: Started on Lasix.  Review of Systems:  Constitutional: negative for anorexia, fevers and sweats Eyes: negative for irritation, redness and visual disturbance Ears, nose, mouth, throat, and face: negative for earaches, epistaxis, nasal congestion and sore throat Respiratory: SOB Cardiovascular: negative for chest pain, dyspnea, lower extremity edema, orthopnea, palpitations and syncope Gastrointestinal: negative for abdominal pain, constipation, diarrhea, melena, nausea and vomiting Genitourinary:negative for dysuria, frequency and hematuria Hematologic/lymphatic: negative for bleeding, easy bruising and lymphadenopathy Musculoskeletal:negative for arthralgias, muscle weakness and stiff joints Neurological: negative for coordination problems, gait problems, headaches and weakness Endocrine: negative for diabetic symptoms including polydipsia, polyuria and weight loss Allergic/Immunologic: negative for anaphylaxis, hay fever and urticaria  Past Medical History:  Diagnosis Date  . Acute MI   . Anxiety   . Coronary artery disease   . Diabetes mellitus without complication ([place])   . Diverticulosis    mild  . Hemorrhoids   . HOH (hard of hearing)   . Hx of colonic polyps   . Hyperlipidemia   . Hypertension   . Left ventricular  dysfunction    hx of    Past Surgical History:  Procedure Laterality Date  . ANGIOPLASTY     and bare metal stent placement  . ANKLE SURGERY Right 1986   i&d abscess- pt states "put a bone in it"  . CORONARY ANGIOPLASTY WITH STENT PLACEMENT    . KNEE ARTHROPLASTY Right 08/09/2016   Procedure: RIGHT TOTAL KNEE ARTHROPLASTY WITH COMPUTER NAVIGATION;  Surgeon: Rod Can, MD;  Location: [facility];  Service: Orthopedics;  Laterality: Right;  Needs RNFA     reports that he quit smoking about 10 years ago. His smoking use included Cigars. He quit smokeless tobacco use about 10 years ago. He reports that he does not drink alcohol or use drugs.  Allergies  Allergen Reactions  . Penicillins Other (See Comments)    Has patient had a PCN reaction causing immediate rash, facial/tongue/throat swelling, SOB or lightheadedness with hypotension: Unknown Has patient had a PCN reaction causing severe rash involving mucus membranes or skin necrosis: Unknown Has patient had a PCN reaction that required hospitalization: Unknown Has patient had a PCN reaction occurring within the last 10 years: No If all of the above answers are "NO", then may proceed with Cephalosporin use.     Family History  Problem Relation Age of Onset  . Coronary artery disease      positive family hx of   Prior to Admission medications   Medication Sig Start Date End Date Taking? Authorizing Provider  aspirin 81 MG tablet Take 81 mg by mouth daily.     Yes Historical Provider, MD  Aspirin-Salicylamide-Caffeine (BC HEADACHE POWDER PO) Take 1 packet by mouth 2 (two) times daily as needed (HEADACHE AND PAIN).   Yes Historical Provider, MD  furosemide (LASIX) 40 MG tablet Take 40  mg by mouth 2 (two) times daily.     Yes Historical Provider, MD  HYDROcodone-acetaminophen (NORCO/VICODIN) 5-325 MG tablet Take 1 tablet by mouth every 8 (eight) hours as needed for moderate pain.  12/05/15  Yes Historical Provider, MD  isosorbide  mononitrate (IMDUR) 30 MG 24 hr tablet Take 1 tablet (30 mg total) by mouth daily. 09/06/15  Yes Burnell Blanks, MD  LORazepam (ATIVAN) 1 MG tablet Take 1 mg by mouth every 8 (eight) hours as needed for anxiety. AS NEEDED FOR ANXIETY 06/22/16  Yes Historical Provider, MD  meloxicam (MOBIC) 7.5 MG tablet Take 1 tablet (7.5 mg total) by mouth daily. Patient taking differently: Take 7.5 mg by mouth 2 (two) times daily as needed for pain.  03/20/14  Yes April Palumbo, MD  metFORMIN (GLUCOPHAGE) 1000 MG tablet Take 1,000 mg by mouth 2 (two) times daily.   Yes Historical Provider, MD  metoprolol (LOPRESSOR) 50 MG tablet TAKE 1 TABLET BY MOUTH TWICE DAILY 10/05/15  Yes Burnell Blanks, MD  nitroGLYCERIN (NITROSTAT) 0.4 MG SL tablet Place 1 tablet (0.4 mg total) under the tongue every 5 (five) minutes as needed for chest pain. 09/06/15  Yes Burnell Blanks, MD  ramipril (ALTACE) 2.5 MG tablet Take 2.5 mg by mouth daily.     Yes Historical Provider, MD  simvastatin (ZOCOR) 40 MG tablet TAKE 1 TABLET BY MOUTH AT BEDTIME 09/07/15  Yes Burnell Blanks, MD  sitaGLIPtin (JANUVIA) 100 MG tablet Take 100 mg by mouth every evening.    Yes Historical Provider, MD  traMADol (ULTRAM) 50 MG tablet Take 1 tablet by mouth every 8 (eight) hours as needed for moderate pain.  01/01/14  Yes Historical Provider, MD  aspirin 81 MG chewable tablet Chew 1 tablet (81 mg total) by mouth 2 (two) times daily. 08/10/16   Rod Can, MD  docusate sodium (COLACE) 100 MG capsule Take 1 capsule (100 mg total) by mouth 2 (two) times daily. 08/10/16   Rod Can, MD  methocarbamol (ROBAXIN) 500 MG tablet Take 1 tablet (500 mg total) by mouth every 6 (six) hours as needed for muscle spasms. 08/10/16   Rod Can, MD  ondansetron (ZOFRAN) 4 MG tablet Take 1 tablet (4 mg total) by mouth every 6 (six) hours as needed for nausea. 08/10/16   Rod Can, MD  oxyCODONE (OXY IR/ROXICODONE) 5 MG immediate release  tablet Take 1-2 tablets (5-10 mg total) by mouth every 3 (three) hours as needed for breakthrough pain. 08/10/16   Rod Can, MD  pantoprazole (PROTONIX) 40 MG tablet Take 1 tablet (40 mg total) by mouth daily. Patient not taking: Reported on 07/31/2016 01/06/14   Gregor Hams, MD  senna (SENOKOT) 8.6 MG TABS tablet Take 2 tablets (17.2 mg total) by mouth at bedtime. 08/10/16   Rod Can, MD    Physical Exam:  Vitals:   08/11/16 2224 08/12/16 0438 08/12/16 0905 08/12/16 1040  BP: 110/68 (!) 143/66 (!) 115/49 (!) 142/68  Pulse:  88 100 (!) 113  Resp:  20 (!) 26   Temp:  100.2 F (37.9 C) (!) 100.5 F (38.1 C)   TempSrc:  Oral Oral   SpO2:  94% (!) 7% 99%  Weight:      Height:        Constitutional: NAD, calm, comfortable Eyes: PERRL, lids and conjunctivae normal ENMT: Mucous membranes are moist. Posterior pharynx clear of any exudate or lesions.Normal dentition.  Neck: normal, supple, no masses, no thyromegaly Respiratory: clear  to auscultation bilaterally, no wheezing, no crackles. Normal respiratory effort. No accessory muscle use.  Cardiovascular: Regular rate and rhythm, no murmurs / rubs / gallops. No extremity edema. 2+ pedal pulses. No carotid bruits.  Abdomen: no tenderness, no masses palpated. No hepatosplenomegaly. Bowel sounds positive.  Musculoskeletal: no clubbing / cyanosis. No joint deformity upper and lower extremities. Good ROM, no contractures. Normal muscle tone.  Skin: no rashes, lesions, ulcers. No induration Neurologic: CN 2-12 grossly intact. Sensation intact, DTR normal. Strength 5/5 in all 4.  Psychiatric: Normal judgment and insight. Alert and oriented x 3. Normal mood.   Labs on Admission: I have personally reviewed following labs and imaging studies  CBC:  Recent Labs Lab 08/10/16 0417 08/11/16 0448 08/12/16 0458  WBC 10.4 11.8* 10.1  HGB 11.4* 10.7* 10.6*  HCT 34.2* 31.0* 31.2*  MCV 91.7 90.6 89.4  PLT 186 180 [id number]   Basic Metabolic  Panel:  Recent Labs Lab 08/10/16 0417  NA 135  K 4.3  CL 104  CO2 23  GLUCOSE 229*  BUN 13  CREATININE 0.90  CALCIUM 8.9   GFR: Estimated Creatinine Clearance: 94.6 mL/min (by C-G formula based on SCr of 0.9 mg/dL). Liver Function Tests: No results for input(s): AST, ALT, ALKPHOS, BILITOT, PROT, ALBUMIN in the last 168 hours. No results for input(s): LIPASE, AMYLASE in the last 168 hours. No results for input(s): AMMONIA in the last 168 hours. Coagulation Profile: No results for input(s): INR, PROTIME in the last 168 hours. Cardiac Enzymes: No results for input(s): CKTOTAL, CKMB, CKMBINDEX, TROPONINI in the last 168 hours. BNP (last 3 results) No results for input(s): PROBNP in the last 8760 hours. HbA1C: No results for input(s): HGBA1C in the last 72 hours. CBG:  Recent Labs Lab 08/11/16 0804 08/11/16 0820 08/11/16 1708 08/12/16 0713 08/12/16 1045  GLUCAP 203* 224* 143* 156* 167*   Lipid Profile: No results for input(s): CHOL, HDL, LDLCALC, TRIG, CHOLHDL, LDLDIRECT in the last 72 hours. Thyroid Function Tests: No results for input(s): TSH, T4TOTAL, FREET4, T3FREE, THYROIDAB in the last 72 hours. Anemia Panel: No results for input(s): VITAMINB12, FOLATE, FERRITIN, TIBC, IRON, RETICCTPCT in the last 72 hours. Urine analysis:    Component Value Date/Time   COLORURINE YELLOW 05/12/2016 1500   APPEARANCEUR CLEAR 05/12/2016 1500   LABSPEC 1.013 05/12/2016 1500   PHURINE 5.5 05/12/2016 1500   GLUCOSEU NEGATIVE 05/12/2016 1500   HGBUR NEGATIVE 05/12/2016 1500   BILIRUBINUR NEGATIVE 05/12/2016 1500   KETONESUR NEGATIVE 05/12/2016 1500   PROTEINUR NEGATIVE 05/12/2016 1500   UROBILINOGEN 0.2 01/06/2014 1257   NITRITE NEGATIVE 05/12/2016 1500   LEUKOCYTESUR NEGATIVE 05/12/2016 1500   Sepsis Labs: !!!!!!!!!!!!!!!!!!!!!!!!!!!!!!!!!!!!!!!!!!!! Invalid input(s): PROCALCITONIN, LACTICIDVEN No results found for this or any previous visit (from the past 240 hour(s)).    Radiological Exams on Admission: No results found.  EKG: Independently reviewed.   Assessment/Plan Principal Problem:   Osteoarthritis of right knee Active Problems:   SOB (shortness of breath)   Tachycardia   Low grade fever    Tachycardia and DOE -Was on IV fluids, last LVEF from 2009 was normal, please note he is on Lasix 40 mg twice a day at home. -IV Lasix given, check BNP. -CT angiography of the chest to rule out PE. -Cycle cardiac enzymes, EKG without ischemic findings. -Check UA.  Low-grade fever -Discussed with the orthopedics PA, likely this is postoperative atelectasis. -Check UA, defer antibiotics for now.   DVT prophylaxis: Twice a day aspirin Code Status: Full Code  Family Communication: Plan D/W patient   Scnetx A MD [facility] Pager [phone]  If 7PM-7AM, please contact night-coverage [email or web address] Password [email or web address]  08/12/2016, 1:40 PM

## 2016-08-12 NOTE — Progress Notes (Signed)
    Subjective: 3 Days Post-Op Procedure(s) (LRB): RIGHT TOTAL KNEE ARTHROPLASTY WITH COMPUTER NAVIGATION (Right) Patient reports pain as 9 on 0-10 scale.   Denies CP or SOB.  Voiding without difficulty. Positive flatus.  Pt wife says there are 5-6 step to get into the house.  Pt says there are only 2 step to get into the back of the house.  The pts wife does not think she can help him up the steps.   Objective: Vital signs in last 24 hours: Temp:  [98.4 F (36.9 C)-100.8 F (38.2 C)] 100.2 F (37.9 C) (11/05 0438) Pulse Rate:  [81-93] 88 (11/05 0438) Resp:  [18-27] 20 (11/05 0438) BP: (99-143)/(42-99) 143/66 (11/05 0438) SpO2:  [94 %-97 %] 94 % (11/05 0438)  Intake/Output from previous day: 11/04 0701 - 11/05 0700 In: 720 [P.O.:720] Out: 2100 [Urine:2100] Intake/Output this shift: No intake/output data recorded.  Labs:  Recent Labs  08/10/16 0417 08/11/16 0448 08/12/16 0458  HGB 11.4* 10.7* 10.6*    Recent Labs  08/11/16 0448 08/12/16 0458  WBC 11.8* 10.1  RBC 3.42* 3.49*  HCT 31.0* 31.2*  PLT 180 171    Recent Labs  08/10/16 0417  NA 135  K 4.3  CL 104  CO2 23  BUN 13  CREATININE 0.90  GLUCOSE 229*  CALCIUM 8.9   No results for input(s): LABPT, INR in the last 72 hours.  Physical Exam: Neurologically intact ABD soft Sensation intact distally Dorsiflexion/Plantar flexion intact Incision: no drainage Compartment soft  Assessment/Plan: 3 Days Post-Op Procedure(s) (LRB): RIGHT TOTAL KNEE ARTHROPLASTY WITH COMPUTER NAVIGATION (Right) Up with therapy  Pt may DC home today after cleared by PT Pt wife is concerned about him getting up steps to get into the house.  Kyliee Ortego, Darla Lesches for Dr. Melina Schools [facility] [phone] 08/12/2016, 8:31 AM    Patient ID: Vincent [name], male   DOB: 01/14/1951, 65 y.o.   MRN: [id number]

## 2016-08-13 ENCOUNTER — Other Ambulatory Visit (HOSPITAL_COMMUNITY): Payer: BLUE CROSS/BLUE SHIELD

## 2016-08-13 ENCOUNTER — Inpatient Hospital Stay (HOSPITAL_COMMUNITY): Payer: BLUE CROSS/BLUE SHIELD

## 2016-08-13 DIAGNOSIS — R0602 Shortness of breath: Secondary | ICD-10-CM

## 2016-08-13 DIAGNOSIS — R509 Fever, unspecified: Secondary | ICD-10-CM

## 2016-08-13 DIAGNOSIS — M1711 Unilateral primary osteoarthritis, right knee: Principal | ICD-10-CM

## 2016-08-13 LAB — GLUCOSE, CAPILLARY: Glucose-Capillary: 148 mg/dL — ABNORMAL HIGH (ref 65–99)

## 2016-08-13 LAB — TROPONIN I: Troponin I: <0.03 ng/mL (ref <0.03)

## 2016-08-13 NOTE — Progress Notes (Signed)
PROGRESS NOTE                                                                                                                                                                                                             Patient Demographics:    Vincent [name], is a 65 y.o. male, DOB - Jun 14, 1951, [id number]  Admit date - 08/09/2016   Admitting Physician Rod Can, MD  Outpatient Primary MD for the patient is Marton Redwood, MD  LOS - 4    No chief complaint on file.      Brief Narrative   65 year old male with CAD status post PCI admitted with elective right knee arthroplasty on 07/09/2016. Patient found to have shortness of breath and tachycardia on 11/5 with low-grade fever. Hospitalist consulted.   Subjective:   Low-grade temperature. Denies further shortness of breath. Vitals table.   Assessment  & Plan :   Dyspnea on exertion with tachycardia CT angiogram of the chest negative for PE. Serial cardiac enzymes negative. EKG unremarkable. UA negative. No further symptoms. Suspect could be associated with atelectasis and mild volume overload. Given IV Lasix. Resume home dose Lasix upon discharge.  Low-grade fever Suspect postoperative atelectasis. Workup negative for infection.  Coronary artery disease Follows with Dr. Julianne Handler. Continue aspirin, Lasix, Imdur, statin, Altace   Discharge per primary team. Will sign off.   Code Status : Full code  Family Communication  : None at bedside  Disposition Plan  : Per primary team.  Lab Results  Component Value Date   PLT 171 08/12/2016    Antibiotics  :   Anti-infectives    Start     Dose/Rate Route Frequency Ordered Stop   08/10/16 0300  vancomycin (VANCOCIN) IVPB 1000 mg/200 mL premix     1,000 mg 200 mL/hr over 60 Minutes Intravenous Every 12 hours 08/09/16 2020 08/10/16 0333   08/09/16 0600  vancomycin (VANCOCIN) 1,500 mg in sodium chloride 0.9  % 500 mL IVPB     1,500 mg 250 mL/hr over 120 Minutes Intravenous On call to O.R. 08/08/16 1329 08/09/16 1707        Objective:   Vitals:   08/12/16 1040 08/12/16 1359 08/12/16 2137 08/13/16 0538  BP: (!) 142/68 120/72 92/60 112/62  Pulse: (!) 113 85 86 99  Resp:   18 18  Temp:  97.3 F (36.3 C) 98.9 F (37.2 C) 99.1 F (37.3 C)  TempSrc:  Oral Oral Oral  SpO2: 99% 99% 100% 95%  Weight:      Height:        Wt Readings from Last 3 Encounters:  08/09/16 106.1 kg (234 lb)  08/02/16 106.1 kg (234 lb)  06/27/16 108.1 kg (238 lb 6.4 oz)     Intake/Output Summary (Last 24 hours) at 08/13/16 1147 Last data filed at 08/13/16 1036  Gross per 24 hour  Intake             1320 ml  Output             2375 ml  Net            -1055 ml     Physical Exam  Gen: not in distress HEENT:  moist mucosa, supple neck Chest: clear b/l, no added sounds CVS: N S1&S2, no murmurs, rubs or gallop GI: soft, NT, ND,  Musculoskeletal: warm,Clean dressing over right knee.    Data Review:    CBC  Recent Labs Lab 08/10/16 0417 08/11/16 0448 08/12/16 0458  WBC 10.4 11.8* 10.1  HGB 11.4* 10.7* 10.6*  HCT 34.2* 31.0* 31.2*  PLT 186 180 171  MCV 91.7 90.6 89.4  MCH 30.6 31.3 30.4  MCHC 33.3 34.5 34.0  RDW 13.5 13.9 13.8    Chemistries   Recent Labs Lab 08/10/16 0417  NA 135  K 4.3  CL 104  CO2 23  GLUCOSE 229*  BUN 13  CREATININE 0.90  CALCIUM 8.9   ------------------------------------------------------------------------------------------------------------------ No results for input(s): CHOL, HDL, LDLCALC, TRIG, CHOLHDL, LDLDIRECT in the last 72 hours.  Lab Results  Component Value Date   HGBA1C 6.1 (H) 08/02/2016   ------------------------------------------------------------------------------------------------------------------ No results for input(s): TSH, T4TOTAL, T3FREE, THYROIDAB in the last 72 hours.  Invalid input(s):  FREET3 ------------------------------------------------------------------------------------------------------------------ No results for input(s): VITAMINB12, FOLATE, FERRITIN, TIBC, IRON, RETICCTPCT in the last 72 hours.  Coagulation profile No results for input(s): INR, PROTIME in the last 168 hours.  No results for input(s): DDIMER in the last 72 hours.  Cardiac Enzymes  Recent Labs Lab 08/12/16 1301 08/12/16 1835 08/13/16 0029  TROPONINI <0.03 <0.03 <0.03   ------------------------------------------------------------------------------------------------------------------    Component Value Date/Time   BNP 100.5 (H) 08/12/2016 1430    Inpatient Medications  Scheduled Meds: . aspirin  81 mg Oral BID  . dexamethasone  10 mg Intravenous Once  . docusate sodium  100 mg Oral BID  . furosemide  40 mg Oral BID  . isosorbide mononitrate  30 mg Oral Daily  . linagliptin  5 mg Oral Daily  . metFORMIN  1,000 mg Oral BID WC  . metoprolol  50 mg Oral BID  . oxyCODONE  10 mg Oral Q12H  . pantoprazole  40 mg Oral Daily  . ramipril  2.5 mg Oral Daily  . senna  2 tablet Oral QHS  . simvastatin  40 mg Oral QHS   Continuous Infusions: PRN Meds:.acetaminophen **OR** acetaminophen, alum & mag hydroxide-simeth, diphenhydrAMINE, LORazepam, menthol-cetylpyridinium **OR** phenol, methocarbamol **OR** methocarbamol (ROBAXIN)  IV, metoCLOPramide **OR** metoCLOPramide (REGLAN) injection, nitroGLYCERIN, ondansetron **OR** ondansetron (ZOFRAN) IV, oxyCODONE, polyethylene glycol  Micro Results No results found for this or any previous visit (from the past 240 hour(s)).  Radiology Reports Ct Angio Chest Pe W Or Wo Contrast  Result Date: 08/12/2016 CLINICAL DATA:  Shortness of breath and tachycardia. Three days postop from right knee arthroplasty. EXAM: CT ANGIOGRAPHY CHEST  WITH CONTRAST TECHNIQUE: Multidetector CT imaging of the chest was performed using the standard protocol during bolus  administration of intravenous contrast. Multiplanar CT image reconstructions and MIPs were obtained to evaluate the vascular anatomy. CONTRAST:  100 mL Isovue 370 COMPARISON:  None. FINDINGS: Cardiovascular: Satisfactory opacification of pulmonary arteries noted, and no pulmonary emboli identified. No evidence of thoracic aortic dissection or aneurysm. Aortic atherosclerosis. Coronary artery calcification seen with stent in the proximal LAD. Heart size is normal. Mediastinum/Nodes: No masses or pathologically enlarged lymph nodes identified. Lungs/Pleura: No pulmonary mass, infiltrate, or effusion. Upper abdomen: No acute findings. Musculoskeletal: No suspicious bone lesions or other significant abnormality identified. IMPRESSION: No evidence of pulmonary embolism or other acute findings. Aortic and coronary artery atherosclerosis. Electronically Signed   By: Earle Gell M.D.   On: 08/12/2016 15:29   Dg Knee Right Port  Result Date: 08/09/2016 CLINICAL DATA:  Status post right knee arthroplasty. EXAM: PORTABLE RIGHT KNEE - 1-2 VIEW COMPARISON:  03/11/2014. FINDINGS: Interval right total knee prosthesis in satisfactory position and alignment. No fracture or dislocation is seen. IMPRESSION: Satisfactory postoperative appearance of a right total knee prosthesis. Electronically Signed   By: Claudie Revering M.D.   On: 08/09/2016 20:05    Time Spent in minutes  25   Louellen Molder M.D on 08/13/2016 at 11:47 AM  Between 7am to 7pm - Pager - [phone]  After 7pm go to [email or web address] - password [facility]  [facility] -  Office  [phone]

## 2016-08-13 NOTE — Progress Notes (Signed)
   Subjective:  Patient reports pain as mild to moderate.  Denies N/V/CP/SOB. CT chest for PE (-). Wants to go home.  Objective:   VITALS:   Vitals:   08/12/16 1040 08/12/16 1359 08/12/16 2137 08/13/16 0538  BP: (!) 142/68 120/72 92/60 112/62  Pulse: (!) 113 85 86 99  Resp:   18 18  Temp:  97.3 F (36.3 C) 98.9 F (37.2 C) 99.1 F (37.3 C)  TempSrc:  Oral Oral Oral  SpO2: 99% 99% 100% 95%  Weight:      Height:        NAD Neurologically intact ABD soft Sensation intact distally Intact pulses distally Dorsiflexion/Plantar flexion intact Incision: dressing C/D/I Compartment soft   Lab Results  Component Value Date   WBC 10.1 08/12/2016   HGB 10.6 (L) 08/12/2016   HCT 31.2 (L) 08/12/2016   MCV 89.4 08/12/2016   PLT 171 08/12/2016   BMET    Component Value Date/Time   NA 135 08/10/2016 0417   K 4.3 08/10/2016 0417   CL 104 08/10/2016 0417   CO2 23 08/10/2016 0417   GLUCOSE 229 (H) 08/10/2016 0417   BUN 13 08/10/2016 0417   CREATININE 0.90 08/10/2016 0417   CALCIUM 8.9 08/10/2016 0417   GFRNONAA >60 08/10/2016 0417   GFRAA >60 08/10/2016 0417     Assessment/Plan: 4 Days Post-Op   Principal Problem:   Osteoarthritis of right knee Active Problems:   SOB (shortness of breath)   Tachycardia   Low grade fever   WBAT with walker DVT ppx: ASA, SCDs, TEDs PO pain control PT/OT Appreciate hospitalist input Dispo: ok for d/c home from ortho standpoint   Domonique Brouillard, Horald Pollen 08/13/2016, 7:09 AM   Rod Can, MD Cell [phone]

## 2016-08-13 NOTE — Progress Notes (Signed)
Physical Therapy Treatment Patient Details Name: Vincent [name] MRN: [id number] DOB: [birth year] Today's Date: 08/13/2016    History of Present Illness s/p R TKA    PT Comments    Patient was seen in recliner upon arrival with family present. Performed sit to stand with VC's for safe UE and LE placement to push up from the recliner when standing instead of using the walker. Gait x 8 ft x minA with VC's for sequencing and increasing weight bearing on RLE. Patient was very unsteady on his feet and had decreased weight bearing/weight shifting onto RLE secondary to pain. Stairs x 1 x modA. Patient has 4 stairs at home, but was only able to complete one stair before taking a rest break on the first step. Bp: 162/84, HR 118, O2 98%. Patient was not able to complete other 3 stairs and was sweating profusely. Family was given a copy of HEP with reps and sets included. Was given the opportunity to ask questions. Patient is very impulsive and displays decreased attention during tasks. Requires frequent redirection to complete a task. Patient is limited due to pain, weakness, decreased safety awareness, impulsiveness, and decreased attention during tasks. RN notified that patient will need ambulance transportation to return home. Patient plans to D/C home today.   Follow Up Recommendations  Home health PT     Equipment Recommendations  None recommended by PT    Recommendations for Other Services       Precautions / Restrictions Precautions Precautions: Knee;Fall Required Braces or Orthoses: Knee Immobilizer - Right Knee Immobilizer - Right: Discontinue once straight leg raise with < 10 degree lag Restrictions Weight Bearing Restrictions: No Other Position/Activity Restrictions: WBAT    Mobility  Bed Mobility                  Transfers Overall transfer level: Needs assistance Equipment used: Rolling walker (2 wheeled) Transfers: Sit to/from Stand Sit to Stand: Min assist Stand  pivot transfers: Min assist       General transfer comment: Increased time and cues for correct LE management and safe UE placement to push up from the chair with BUE.   Ambulation/Gait Ambulation/Gait assistance: Min assist;+2 safety/equipment Ambulation Distance (Feet): 8 Feet Assistive device: Rolling walker (2 wheeled) Gait Pattern/deviations: Step-to pattern;Trunk flexed;Decreased step length - right;Decreased step length - left;Antalgic;Decreased stance time - right;Decreased stride length Gait velocity: decreased Gait velocity interpretation: Below normal speed for age/gender General Gait Details: limited secondary to pain.   Stairs Stairs: Yes Stairs assistance: Mod assist Stair Management: No rails Number of Stairs: 1 General stair comments: Required VC's for safe sequencing and positioning of family member. Patient has 4 stairs into home with no railing. Patient was only able to ascend one stair before having to take a rest break on the stair. Patient was unable to complete all 4 stairs d/t pain and fatigue.   Wheelchair Mobility    Modified Rankin (Stroke Patients Only)       Balance                                    Cognition Arousal/Alertness: Awake/alert Behavior During Therapy: Impulsive Overall Cognitive Status: Within Functional Limits for tasks assessed Area of Impairment: Following commands;Safety/judgement;Attention       Following Commands: Follows one step commands inconsistently Safety/Judgement: Decreased awareness of safety     General Comments: pt talks excessively, does not adhere to  therapist's advice and requires frequent redirection to task    Exercises      General Comments        Pertinent Vitals/Pain Pain Assessment: 0-10 Pain Score: 5  Pain Location: R knee Pain Descriptors / Indicators: Discomfort;Grimacing Pain Intervention(s): Limited activity within patient's tolerance;Monitored during  session;Repositioned;Ice applied    Home Living                      Prior Function            PT Goals (current goals can now be found in the care plan section) Progress towards PT goals: Progressing toward goals    Frequency    7X/week      PT Plan Current plan remains appropriate    Co-evaluation             End of Session Equipment Utilized During Treatment: Gait belt Activity Tolerance: Patient limited by pain Patient left: with call bell/phone within reach;with bed alarm set;in chair;with family/visitor present     Time:  -     Charges:                       G CodesHall Busing, SPTA [facility] [phone]  Present and agree with above  Rica Koyanagi  PTA [facility] Pager      [phone]

## 2016-08-15 LAB — URINE CULTURE: Culture: 20000 — AB

## 2016-09-05 ENCOUNTER — Other Ambulatory Visit: Payer: Self-pay | Admitting: Cardiovascular Disease

## 2016-09-10 ENCOUNTER — Other Ambulatory Visit: Payer: Self-pay | Admitting: Cardiovascular Disease

## 2016-09-12 DIAGNOSIS — M25661 Stiffness of right knee, not elsewhere classified: Secondary | ICD-10-CM | POA: Diagnosis not present

## 2016-09-17 DIAGNOSIS — M25661 Stiffness of right knee, not elsewhere classified: Secondary | ICD-10-CM | POA: Diagnosis not present

## 2016-09-20 DIAGNOSIS — M25661 Stiffness of right knee, not elsewhere classified: Secondary | ICD-10-CM | POA: Diagnosis not present

## 2016-09-21 DIAGNOSIS — Z96651 Presence of right artificial knee joint: Secondary | ICD-10-CM | POA: Diagnosis not present

## 2016-10-12 DIAGNOSIS — E784 Other hyperlipidemia: Secondary | ICD-10-CM | POA: Diagnosis not present

## 2016-10-12 DIAGNOSIS — Z125 Encounter for screening for malignant neoplasm of prostate: Secondary | ICD-10-CM | POA: Diagnosis not present

## 2016-10-12 DIAGNOSIS — I10 Essential (primary) hypertension: Secondary | ICD-10-CM | POA: Diagnosis not present

## 2016-10-12 DIAGNOSIS — E119 Type 2 diabetes mellitus without complications: Secondary | ICD-10-CM | POA: Diagnosis not present

## 2016-10-18 DIAGNOSIS — G4733 Obstructive sleep apnea (adult) (pediatric): Secondary | ICD-10-CM | POA: Diagnosis not present

## 2016-10-18 DIAGNOSIS — Z125 Encounter for screening for malignant neoplasm of prostate: Secondary | ICD-10-CM | POA: Diagnosis not present

## 2016-10-18 DIAGNOSIS — Z23 Encounter for immunization: Secondary | ICD-10-CM | POA: Diagnosis not present

## 2016-10-18 DIAGNOSIS — Z0000 Encounter for general adult medical examination without abnormal findings: Secondary | ICD-10-CM | POA: Diagnosis not present

## 2016-10-18 DIAGNOSIS — E784 Other hyperlipidemia: Secondary | ICD-10-CM | POA: Diagnosis not present

## 2016-10-18 DIAGNOSIS — Z9861 Coronary angioplasty status: Secondary | ICD-10-CM | POA: Diagnosis not present

## 2016-10-18 DIAGNOSIS — F419 Anxiety disorder, unspecified: Secondary | ICD-10-CM | POA: Diagnosis not present

## 2016-10-18 DIAGNOSIS — Z1389 Encounter for screening for other disorder: Secondary | ICD-10-CM | POA: Diagnosis not present

## 2016-10-18 DIAGNOSIS — E119 Type 2 diabetes mellitus without complications: Secondary | ICD-10-CM | POA: Diagnosis not present

## 2016-10-18 DIAGNOSIS — I10 Essential (primary) hypertension: Secondary | ICD-10-CM | POA: Diagnosis not present

## 2016-10-18 DIAGNOSIS — I5020 Unspecified systolic (congestive) heart failure: Secondary | ICD-10-CM | POA: Diagnosis not present

## 2016-10-26 DIAGNOSIS — Z1212 Encounter for screening for malignant neoplasm of rectum: Secondary | ICD-10-CM | POA: Diagnosis not present

## 2017-01-02 ENCOUNTER — Encounter: Payer: Self-pay | Admitting: Gastroenterology

## 2017-01-16 DIAGNOSIS — Z6832 Body mass index (BMI) 32.0-32.9, adult: Secondary | ICD-10-CM | POA: Diagnosis not present

## 2017-01-16 DIAGNOSIS — G8929 Other chronic pain: Secondary | ICD-10-CM | POA: Diagnosis not present

## 2017-03-11 ENCOUNTER — Other Ambulatory Visit: Payer: Self-pay | Admitting: Cardiovascular Disease

## 2017-04-18 DIAGNOSIS — E119 Type 2 diabetes mellitus without complications: Secondary | ICD-10-CM | POA: Diagnosis not present

## 2017-04-18 DIAGNOSIS — I5020 Unspecified systolic (congestive) heart failure: Secondary | ICD-10-CM | POA: Diagnosis not present

## 2017-04-18 DIAGNOSIS — Z6839 Body mass index (BMI) 39.0-39.9, adult: Secondary | ICD-10-CM | POA: Diagnosis not present

## 2017-04-18 DIAGNOSIS — I10 Essential (primary) hypertension: Secondary | ICD-10-CM | POA: Diagnosis not present

## 2017-04-18 DIAGNOSIS — Z9861 Coronary angioplasty status: Secondary | ICD-10-CM | POA: Diagnosis not present

## 2017-04-18 DIAGNOSIS — E785 Hyperlipidemia, unspecified: Secondary | ICD-10-CM | POA: Diagnosis not present

## 2017-04-18 DIAGNOSIS — E784 Other hyperlipidemia: Secondary | ICD-10-CM | POA: Diagnosis not present

## 2017-06-14 ENCOUNTER — Other Ambulatory Visit: Payer: Self-pay | Admitting: Cardiovascular Disease

## 2017-08-05 ENCOUNTER — Other Ambulatory Visit: Payer: Self-pay | Admitting: Cardiovascular Disease

## 2017-08-31 ENCOUNTER — Other Ambulatory Visit: Payer: Self-pay | Admitting: Cardiovascular Disease

## 2017-09-23 ENCOUNTER — Other Ambulatory Visit: Payer: Self-pay | Admitting: Cardiovascular Disease

## 2017-10-14 ENCOUNTER — Ambulatory Visit: Payer: Self-pay | Admitting: Cardiology

## 2017-10-24 DIAGNOSIS — E7849 Other hyperlipidemia: Secondary | ICD-10-CM | POA: Diagnosis not present

## 2017-10-24 DIAGNOSIS — R82998 Other abnormal findings in urine: Secondary | ICD-10-CM | POA: Diagnosis not present

## 2017-10-24 DIAGNOSIS — E119 Type 2 diabetes mellitus without complications: Secondary | ICD-10-CM | POA: Diagnosis not present

## 2017-10-24 DIAGNOSIS — I10 Essential (primary) hypertension: Secondary | ICD-10-CM | POA: Diagnosis not present

## 2017-10-24 DIAGNOSIS — Z125 Encounter for screening for malignant neoplasm of prostate: Secondary | ICD-10-CM | POA: Diagnosis not present

## 2017-10-31 DIAGNOSIS — I10 Essential (primary) hypertension: Secondary | ICD-10-CM | POA: Diagnosis not present

## 2017-10-31 DIAGNOSIS — Z0000 Encounter for general adult medical examination without abnormal findings: Secondary | ICD-10-CM | POA: Diagnosis not present

## 2017-10-31 DIAGNOSIS — E7849 Other hyperlipidemia: Secondary | ICD-10-CM | POA: Diagnosis not present

## 2017-10-31 DIAGNOSIS — E119 Type 2 diabetes mellitus without complications: Secondary | ICD-10-CM | POA: Diagnosis not present

## 2017-10-31 DIAGNOSIS — Z6838 Body mass index (BMI) 38.0-38.9, adult: Secondary | ICD-10-CM | POA: Diagnosis not present

## 2017-10-31 DIAGNOSIS — F39 Unspecified mood [affective] disorder: Secondary | ICD-10-CM | POA: Diagnosis not present

## 2017-10-31 DIAGNOSIS — Z1389 Encounter for screening for other disorder: Secondary | ICD-10-CM | POA: Diagnosis not present

## 2017-10-31 DIAGNOSIS — G8929 Other chronic pain: Secondary | ICD-10-CM | POA: Diagnosis not present

## 2017-10-31 DIAGNOSIS — Z9861 Coronary angioplasty status: Secondary | ICD-10-CM | POA: Diagnosis not present

## 2017-10-31 DIAGNOSIS — I5020 Unspecified systolic (congestive) heart failure: Secondary | ICD-10-CM | POA: Diagnosis not present

## 2017-10-31 DIAGNOSIS — Z23 Encounter for immunization: Secondary | ICD-10-CM | POA: Diagnosis not present

## 2017-11-01 DIAGNOSIS — Z1212 Encounter for screening for malignant neoplasm of rectum: Secondary | ICD-10-CM | POA: Diagnosis not present

## 2017-11-04 ENCOUNTER — Ambulatory Visit: Payer: PPO | Admitting: Cardiology

## 2017-11-04 ENCOUNTER — Encounter: Payer: Self-pay | Admitting: Cardiology

## 2017-11-04 VITALS — BP 132/84 | HR 72 | Ht 66.0 in | Wt 252.0 lb

## 2017-11-04 DIAGNOSIS — E785 Hyperlipidemia, unspecified: Secondary | ICD-10-CM

## 2017-11-04 DIAGNOSIS — I10 Essential (primary) hypertension: Secondary | ICD-10-CM

## 2017-11-04 DIAGNOSIS — I2510 Atherosclerotic heart disease of native coronary artery without angina pectoris: Secondary | ICD-10-CM | POA: Diagnosis not present

## 2017-11-04 MED ORDER — ISOSORBIDE MONONITRATE ER 30 MG PO TB24: 30.0000 mg | ORAL_TABLET | Freq: Every day | ORAL | 3 refills | Status: DC

## 2017-11-04 MED ORDER — SIMVASTATIN 40 MG PO TABS: 40.0000 mg | ORAL_TABLET | Freq: Every day | ORAL | 3 refills | Status: AC

## 2017-11-04 MED ORDER — METOPROLOL TARTRATE 50 MG PO TABS: 50.0000 mg | ORAL_TABLET | Freq: Two times a day (BID) | ORAL | 3 refills | Status: DC

## 2017-11-04 MED ORDER — RAMIPRIL 2.5 MG PO CAPS: 2.5000 mg | ORAL_CAPSULE | Freq: Every day | ORAL | 3 refills | Status: DC

## 2017-11-04 MED ORDER — NITROGLYCERIN 0.4 MG SL SUBL: 0.4000 mg | SUBLINGUAL_TABLET | SUBLINGUAL | 3 refills | Status: DC | PRN

## 2017-11-04 MED ORDER — FUROSEMIDE 40 MG PO TABS: 40.0000 mg | ORAL_TABLET | Freq: Two times a day (BID) | ORAL | 3 refills | Status: DC

## 2017-11-04 MED ORDER — ASPIRIN EC 81 MG PO TBEC: 81.0000 mg | DELAYED_RELEASE_TABLET | Freq: Every day | ORAL | 3 refills | Status: DC

## 2017-11-04 NOTE — Patient Instructions (Addendum)
Medication Instructions:  Your physician has recommended you make the following change in your medication:   DECREASE: Aspirin 81 mg to ONCE daily  Labwork: None ordered  Testing/Procedures: None ordered  Follow-Up: Your physician wants you to follow-up in: 1 year with Dr. Angelena Form. You will receive a reminder letter in the mail two months in advance. If you don't receive a letter, please call our office to schedule the follow-up appointment.   Any Other Special Instructions Will Be Listed Below (If Applicable).     If you need a refill on your cardiac medications before your next appointment, please call your pharmacy.

## 2017-11-04 NOTE — Progress Notes (Signed)
11/04/2017 Vincent [name]   07-22-1951  [id number]  Primary Physician Marton Redwood, MD Primary Cardiologist: Dr. Angelena Form   Reason for Visit/CC: F/u for CAD   HPI:  67 y/o male with history of CAD, HTN, HLD who is here today for cardiac follow up. He has been followed in the past by Dr. Lia Foyer and now followed by Dr. Angelena Form. He had an anterior MI in 2007 with stent placed in LAD at that time. Last cath 06/23/10 per Dr. Lia Foyer with 30-40% proximal LAD restenosis, 30% mid LAD, 40% mid Circumflex, no RCA disease, LVEF=45-50%. Stress myoview April 2015 with no ischemia. He was seen in the ED January 2016 with chest pain while upset. Workup negative. Started on Imdur. No chest pain since.  He is here with his wife. Doing well. No cardiac symptoms. Denies CP and dyspnea. Reports full med compliance. His PCP recently checked labs 10/24/17. LDL was at goal at 60 mg/dL. Renal and hepatic function normal. He denies any tobacco use. BP is well controlled today. Only complaint is chronic left knee pain from OA. No plans for TKR at this time due to cost and fixed income, per pt report.    No outpatient medications have been marked as taking for the 11/04/17 encounter (Office Visit) with Consuelo Pandy, PA-C.   Allergies  Allergen Reactions  . Penicillins Other (See Comments)    Has patient had a PCN reaction causing immediate rash, facial/tongue/throat swelling, SOB or lightheadedness with hypotension: Unknown Has patient had a PCN reaction causing severe rash involving mucus membranes or skin necrosis: Unknown Has patient had a PCN reaction that required hospitalization: Unknown Has patient had a PCN reaction occurring within the last 10 years: No If all of the above answers are "NO", then may proceed with Cephalosporin use.    Past Medical History:  Diagnosis Date  . Acute MI ([place])   . Anxiety   . Coronary artery disease   . Diabetes mellitus without complication ([place])   .  Diverticulosis    mild  . Hemorrhoids   . HOH (hard of hearing)   . Hx of colonic polyps   . Hyperlipidemia   . Hypertension   . Left ventricular dysfunction    hx of   Family History  Problem Relation Age of Onset  . Coronary artery disease Unknown        positive family hx of   Past Surgical History:  Procedure Laterality Date  . ANGIOPLASTY     and bare metal stent placement  . ANKLE SURGERY Right 1986   i&d abscess- pt states "put a bone in it"  . CORONARY ANGIOPLASTY WITH STENT PLACEMENT    . KNEE ARTHROPLASTY Right 08/09/2016   Procedure: RIGHT TOTAL KNEE ARTHROPLASTY WITH COMPUTER NAVIGATION;  Surgeon: Rod Can, MD;  Location: [facility];  Service: Orthopedics;  Laterality: Right;  Needs RNFA   Social History   Socioeconomic History  . Marital status: Married    Spouse name: Not on file  . Number of children: Not on file  . Years of education: Not on file  . Highest education level: Not on file  Social Needs  . Financial resource strain: Not on file  . Food insecurity - worry: Not on file  . Food insecurity - inability: Not on file  . Transportation needs - medical: Not on file  . Transportation needs - non-medical: Not on file  Occupational History  . Occupation: full time    Employer: [employer]  Tobacco Use  . Smoking status: Former Smoker    Types: Cigars    Last attempt to quit: 12/17/2005    Years since quitting: 11.8  . Smokeless tobacco: Former Systems developer    Quit date: 12/17/2005  Substance and Sexual Activity  . Alcohol use: No  . Drug use: No  . Sexual activity: Not on file  Other Topics Concern  . Not on file  Social History Narrative  . Not on file     Review of Systems: General: negative for chills, fever, night sweats or weight changes.  Cardiovascular: negative for chest pain, dyspnea on exertion, edema, orthopnea, palpitations, paroxysmal nocturnal dyspnea or shortness of breath Dermatological: negative for rash Respiratory:  negative for cough or wheezing Urologic: negative for hematuria Abdominal: negative for nausea, vomiting, diarrhea, bright red blood per rectum, melena, or hematemesis Neurologic: negative for visual changes, syncope, or dizziness All other systems reviewed and are otherwise negative except as noted above.   Physical Exam:  Blood pressure 132/84, pulse 72, height 5\' 6"  (1.676 m), weight 252 lb (114.3 kg).  General appearance: alert, cooperative and no distress Neck: no adenopathy, no carotid bruit and thyroid not enlarged, symmetric, no tenderness/mass/nodules Lungs: clear to auscultation bilaterally Heart: regular rate and rhythm, S1, S2 normal, no murmur, click, rub or gallop Extremities: extremities normal, atraumatic, no cyanosis or edema Pulses: 2+ and symmetric Skin: Skin color, texture, turgor normal. No rashes or lesions   EKG NSR nonspecific T wave abnormality-- personally reviewed   ASSESSMENT AND PLAN:   1. CAD: h/o LAD stent in 2007. Stable w/o anginal symptoms. Continue ASA, statin, BB and LA nitrate.     2. HTN: controlled on current regimen. Recent labs showed stable renal function and electrolytes.   3. HLD: on statin therapy. Recent lipid panel showed controlled LDL at 60 mg/dL. Hepatic enzyems ok.   Follow-Up with Dr. Angelena Form in 1 year   Vincent [name], Vincent [name] 11/04/2017 10:17 AM

## 2017-11-05 ENCOUNTER — Encounter: Payer: Self-pay | Admitting: Gastroenterology

## 2017-11-20 ENCOUNTER — Other Ambulatory Visit (HOSPITAL_COMMUNITY): Payer: Self-pay | Admitting: Internal Medicine

## 2017-11-20 DIAGNOSIS — Z87891 Personal history of nicotine dependence: Secondary | ICD-10-CM

## 2017-11-20 DIAGNOSIS — Z0000 Encounter for general adult medical examination without abnormal findings: Secondary | ICD-10-CM

## 2017-11-21 ENCOUNTER — Ambulatory Visit (HOSPITAL_COMMUNITY)
Admission: RE | Admit: 2017-11-21 | Discharge: 2017-11-21 | Disposition: A | Discharge status: Home / Self Care | Payer: PPO | Source: Ambulatory Visit | Attending: Vascular Surgery | Admitting: Vascular Surgery

## 2017-11-21 DIAGNOSIS — Z87891 Personal history of nicotine dependence: Secondary | ICD-10-CM | POA: Diagnosis not present

## 2017-11-21 DIAGNOSIS — Z0000 Encounter for general adult medical examination without abnormal findings: Secondary | ICD-10-CM

## 2017-12-12 ENCOUNTER — Other Ambulatory Visit: Payer: Self-pay

## 2017-12-12 ENCOUNTER — Ambulatory Visit (AMBULATORY_SURGERY_CENTER): Payer: Self-pay

## 2017-12-12 VITALS — Ht 66.0 in | Wt 245.0 lb

## 2017-12-12 DIAGNOSIS — Z1211 Encounter for screening for malignant neoplasm of colon: Secondary | ICD-10-CM

## 2017-12-12 MED ORDER — NA SULFATE-K SULFATE-MG SULF 17.5-3.13-1.6 GM/177ML PO SOLN: 1.0000 | Freq: Once | ORAL | 0 refills | Status: AC

## 2017-12-12 NOTE — Progress Notes (Signed)
Denies allergies to eggs or soy products. Denies complication of anesthesia or sedation. Denies use of weight loss medication. Denies use of O2.   Emmi instructions declined.  

## 2017-12-16 ENCOUNTER — Encounter: Payer: Self-pay | Admitting: Gastroenterology

## 2017-12-26 ENCOUNTER — Ambulatory Visit (AMBULATORY_SURGERY_CENTER): Payer: PPO | Admitting: Gastroenterology

## 2017-12-26 ENCOUNTER — Other Ambulatory Visit: Payer: Self-pay

## 2017-12-26 ENCOUNTER — Encounter: Payer: Self-pay | Admitting: Gastroenterology

## 2017-12-26 VITALS — BP 95/58 | HR 70 | Temp 97.8°F | Resp 14 | Ht 66.0 in | Wt 245.0 lb

## 2017-12-26 DIAGNOSIS — D128 Benign neoplasm of rectum: Secondary | ICD-10-CM

## 2017-12-26 DIAGNOSIS — Z1211 Encounter for screening for malignant neoplasm of colon: Secondary | ICD-10-CM | POA: Diagnosis not present

## 2017-12-26 DIAGNOSIS — K621 Rectal polyp: Secondary | ICD-10-CM | POA: Diagnosis not present

## 2017-12-26 MED ORDER — SODIUM CHLORIDE 0.9 % IV SOLN: 500.0000 mL | Freq: Once | INTRAVENOUS | Status: DC

## 2017-12-26 NOTE — Op Note (Signed)
[place] Patient Name: Vincent [name] Procedure Date: 12/26/2017 9:32 AM MRN: [id number] Endoscopist: Ladene Artist , MD Age: 67 Referring MD:  Date of Birth: April 14, 1951 Gender: Male Account #: [id number] Procedure:                Colonoscopy Indications:              Screening for colorectal malignant neoplasm Medicines:                Monitored Anesthesia Care Procedure:                Pre-Anesthesia Assessment:                           - Prior to the procedure, a History and Physical                            was performed, and patient medications and                            allergies were reviewed. The patient's tolerance of                            previous anesthesia was also reviewed. The risks                            and benefits of the procedure and the sedation                            options and risks were discussed with the patient.                            All questions were answered, and informed consent                            was obtained. Prior Anticoagulants: The patient has                            taken no previous anticoagulant or antiplatelet                            agents. ASA Grade Assessment: III - A patient with                            severe systemic disease. After reviewing the risks                            and benefits, the patient was deemed in                            satisfactory condition to undergo the procedure.                           After obtaining informed consent, the colonoscope  was passed under direct vision. Throughout the                            procedure, the patient's blood pressure, pulse, and                            oxygen saturations were monitored continuously. The                            Model PCF-[id number]) scope was introduced                            through the anus and advanced to the the cecum,                            identified by  appendiceal orifice and ileocecal                            valve. The ileocecal valve, appendiceal orifice,                            and rectum were photographed. The quality of the                            bowel preparation was adequate. The colonoscopy was                            performed without difficulty. The patient tolerated                            the procedure well. Scope In: 9:39:02 AM Scope Out: 9:55:00 AM Scope Withdrawal Time: 0 hours 14 minutes 10 seconds  Total Procedure Duration: 0 hours 15 minutes 58 seconds  Findings:                 The perianal and digital rectal examinations were                            normal.                           Two sessile polyps were found in the rectum. The                            polyps were 5 to 7 mm in size. These polyps were                            removed with a cold snare. Resection and retrieval                            were complete.                           Multiple small-mouthed diverticula were found in  the left colon. There was no evidence of                            diverticular bleeding.                           Internal hemorrhoids were found during                            retroflexion. The hemorrhoids were small and Grade                            I (internal hemorrhoids that do not prolapse).                           The exam was otherwise without abnormality on                            direct and retroflexion views. Complications:            No immediate complications. Estimated blood loss:                            None. Estimated Blood Loss:     Estimated blood loss: none. Impression:               - Two 5 to 7 mm polyps in the rectum, removed with                            a cold snare. Resected and retrieved.                           - Moderate diverticulosis in the left colon. There                            was no evidence of diverticular bleeding.                            - Internal hemorrhoids.                           - The examination was otherwise normal on direct                            and retroflexion views. Recommendation:           - Repeat colonoscopy in 5 years for surveillance if                            polyp(s) precancerous, otherwise 10 years.                           - Patient has a contact number available for                            emergencies. The signs and symptoms of potential  delayed complications were discussed with the                            patient. Return to normal activities tomorrow.                            Written discharge instructions were provided to the                            patient.                           - High fiber diet.                           - Continue present medications.                           - Await pathology results. Ladene Artist, MD 12/26/2017 9:57:46 AM This report has been signed electronically.

## 2017-12-26 NOTE — Patient Instructions (Signed)
**  Handouts given on polyps, Diverticulosis, Hemorrhoids, and High Fiber diet**   YOU HAD AN ENDOSCOPIC PROCEDURE TODAY: Refer to the procedure report and other information in the discharge instructions given to you for any specific questions about what was found during the examination. If this information does not answer your questions, please call [place] office at [phone] to clarify.   YOU SHOULD EXPECT: Some feelings of bloating in the abdomen. Passage of more gas than usual. Walking can help get rid of the air that was put into your GI tract during the procedure and reduce the bloating. If you had a lower endoscopy (such as a colonoscopy or flexible sigmoidoscopy) you may notice spotting of blood in your stool or on the toilet paper. Some abdominal soreness may be present for a day or two, also.  DIET: Your first meal following the procedure should be a light meal and then it is ok to progress to your normal diet. A half-sandwich or bowl of soup is an example of a good first meal. Heavy or fried foods are harder to digest and may make you feel nauseous or bloated. Drink plenty of fluids but you should avoid alcoholic beverages for 24 hours. If you had a esophageal dilation, please see attached instructions for diet.    ACTIVITY: Your care partner should take you home directly after the procedure. You should plan to take it easy, moving slowly for the rest of the day. You can resume normal activity the day after the procedure however YOU SHOULD NOT DRIVE, use power tools, machinery or perform tasks that involve climbing or major physical exertion for 24 hours (because of the sedation medicines used during the test).   SYMPTOMS TO REPORT IMMEDIATELY: A gastroenterologist can be reached at any hour. Please call [phone]  for any of the following symptoms:  Following lower endoscopy (colonoscopy, flexible sigmoidoscopy) Excessive amounts of blood in the stool  Significant tenderness,  worsening of abdominal pains  Swelling of the abdomen that is new, acute  Fever of 100 or higher   FOLLOW UP:  If any biopsies were taken you will be contacted by phone or by letter within the next 1-3 weeks. Call [phone]  if you have not heard about the biopsies in 3 weeks.  Please also call with any specific questions about appointments or follow up tests.

## 2017-12-26 NOTE — Progress Notes (Signed)
A and O x3. Report to RN. Tolerated MAC anesthesia well.

## 2017-12-26 NOTE — Progress Notes (Signed)
Called to room to assist during endoscopic procedure.  Patient ID and intended procedure confirmed with present staff. Received instructions for my participation in the procedure from the performing physician.  

## 2017-12-27 ENCOUNTER — Telehealth: Payer: Self-pay

## 2017-12-27 NOTE — Telephone Encounter (Signed)
  Follow up Call-  Call back number 12/26/2017  Post procedure Call Back phone  # [phone]  Permission to leave phone message Yes  Some recent data might be hidden     Patient questions:  Do you have a fever, pain , or abdominal swelling? No. Pain Score  0 *  Have you tolerated food without any problems? Yes.    Have you been able to return to your normal activities? Yes.    Do you have any questions about your discharge instructions: Diet   No. Medications  No. Follow up visit  No.  Do you have questions or concerns about your Care? No.  Spoke to pt.'s wife.  Actions: * If pain score is 4 or above: No action needed, pain <4.

## 2018-01-07 ENCOUNTER — Ambulatory Visit (INDEPENDENT_AMBULATORY_CARE_PROVIDER_SITE_OTHER): Payer: PPO | Admitting: Orthopaedic Surgery

## 2018-01-07 ENCOUNTER — Encounter (INDEPENDENT_AMBULATORY_CARE_PROVIDER_SITE_OTHER): Payer: Self-pay | Admitting: Orthopaedic Surgery

## 2018-01-07 ENCOUNTER — Ambulatory Visit (INDEPENDENT_AMBULATORY_CARE_PROVIDER_SITE_OTHER): Payer: PPO

## 2018-01-07 DIAGNOSIS — M25561 Pain in right knee: Secondary | ICD-10-CM

## 2018-01-07 DIAGNOSIS — Z96651 Presence of right artificial knee joint: Secondary | ICD-10-CM | POA: Diagnosis not present

## 2018-01-07 NOTE — Progress Notes (Signed)
Office Visit Note   Patient: Vincent [name]           Date of Birth: 1951/07/21           MRN: [id number] Visit Date: 01/07/2018              Requested by: Marton Redwood, MD [address] [place],  [address] PCP: Marton Redwood, MD   Assessment & Plan: Visit Diagnoses:  1. Right knee pain, unspecified chronicity   2. History of total right knee replacement     Plan: It is unfortunate the patient is still hurting status post a right total knee arthroplasty.  I gave him reassurance that the knee replacement itself looks perfect and I see no evidence of any complicating features of it at all.  There is no evidence of loosening of the implants.  The alignment is anatomic and I feel that surgeon did an incredible job with this knee.  Patient has no effusion and excellent range of motion of his knee.  Unfortunately replacements can hurt and I do feel that his chronic narcotics contributed to his pain and have encouraged him to be off of this.  I would not recommend any other surgery for his knee at all.  Follow-Up Instructions: Return if symptoms worsen or fail to improve.   Orders:  Orders Placed This Encounter  Procedures  . XR Knee 1-2 Views Right   No orders of the defined types were placed in this encounter.     Procedures: No procedures performed   Clinical Data: No additional findings.   Subjective: Chief Complaint  Patient presents with  . Right Knee - Pain  The patient is a very pleasant 67 year old gentleman who is status post right total knee arthroplasty done 17 months ago by another Psychologist, sport and exercise in town.  He has chronic pain in his right knee and he says it hurts with a stabbing pain all the time and is not getting better.  He is hurt ever since the surgery.  He denies any swelling in the knee.  He is frustrated that is just not getting better for him.  He is on chronic hydrocodone twice daily there is been on for many years now.  He denies any fever or chills or  recent illnesses.  HPI  Review of Systems He currently denies any calf pain.  He denies any shortness of breath, headache, chest pain, fever, chills.  Objective: Vital Signs: There were no vitals taken for this visit.  Physical Exam He is alert and oriented no acute distress Ortho Exam Examination of his right knee shows good range of motion.  The knee is ligamentously stable.  There is no effusion at all.  His incisions well-healed.  There is no evidence of infection.  And again the knee is ligamentously stable. Specialty Comments:  No specialty comments available.  Imaging: Xr Knee 1-2 Views Right  Result Date: 01/07/2018 2 views of the right knee show a right total knee arthroplasty with no complicating feature.  The alignment is anatomic.  There is no knee joint effusion.  There is no evidence of fracture.  There is no evidence of ostial lysis or loosening.    PMFS History: Patient Active Problem List   Diagnosis Date Noted  . Right knee pain 01/07/2018  . History of total right knee replacement 01/07/2018  . SOB (shortness of breath) 08/12/2016  . Tachycardia 08/12/2016  . Low grade fever 08/12/2016  . Osteoarthritis of right knee 08/09/2016  .  OSA (obstructive sleep apnea) 11/29/2011  . Syncope 07/24/2011  . ARM NUMBNESS 06/19/2010  . EDEMA 03/13/2010  . FASTING HYPERGLYCEMIA 03/13/2010  . CORONARY ATHEROSCLEROSIS NATIVE CORONARY ARTERY 06/20/2009  . COLONIC POLYPS, ADENOMATOUS 01/31/2009  . HLD (hyperlipidemia) 01/31/2009  . Hypertension 01/31/2009  . CAD, UNSPECIFIED SITE 01/31/2009  . HEMORRHOIDS 01/31/2009  . DIVERTICULOSIS, MILD 01/31/2009   Past Medical History:  Diagnosis Date  . Acute MI ([facility])   . Allergy   . Anxiety   . Arthritis   . Coronary artery disease   . Depression   . Diabetes mellitus without complication ([facility])   . Diverticulosis    mild  . Hemorrhoids   . HOH (hard of hearing)   . Hx of colonic polyps   . Hyperlipidemia   .  Hypertension   . Left ventricular dysfunction    hx of    Family History  Problem Relation Age of Onset  . Coronary artery disease Unknown        positive family hx of  . Colon cancer Neg Hx   . Esophageal cancer Neg Hx   . Liver cancer Neg Hx   . Pancreatic cancer Neg Hx   . Rectal cancer Neg Hx   . Stomach cancer Neg Hx     Past Surgical History:  Procedure Laterality Date  . ANGIOPLASTY     and bare metal stent placement  . ANKLE SURGERY Right 1986   i&d abscess- pt states "put a bone in it"  . CORONARY ANGIOPLASTY WITH STENT PLACEMENT    . KNEE ARTHROPLASTY Right 08/09/2016   Procedure: RIGHT TOTAL KNEE ARTHROPLASTY WITH COMPUTER NAVIGATION;  Surgeon: Rod Can, MD;  Location: [facility];  Service: Orthopedics;  Laterality: Right;  Needs RNFA   Social History   Occupational History  . Occupation: full time    Employer: [employer]  Tobacco Use  . Smoking status: Former Smoker    Types: Cigars    Last attempt to quit: 12/17/2005    Years since quitting: 12.0  . Smokeless tobacco: Former Systems developer    Quit date: 12/17/2005  Substance and Sexual Activity  . Alcohol use: No  . Drug use: No  . Sexual activity: Not on file

## 2018-01-08 ENCOUNTER — Encounter: Payer: Self-pay | Admitting: Gastroenterology

## 2018-02-27 DIAGNOSIS — E119 Type 2 diabetes mellitus without complications: Secondary | ICD-10-CM | POA: Diagnosis not present

## 2018-02-27 DIAGNOSIS — H6122 Impacted cerumen, left ear: Secondary | ICD-10-CM | POA: Diagnosis not present

## 2018-02-27 DIAGNOSIS — M25561 Pain in right knee: Secondary | ICD-10-CM | POA: Diagnosis not present

## 2018-02-27 DIAGNOSIS — H9193 Unspecified hearing loss, bilateral: Secondary | ICD-10-CM | POA: Diagnosis not present

## 2018-02-27 DIAGNOSIS — I10 Essential (primary) hypertension: Secondary | ICD-10-CM | POA: Diagnosis not present

## 2018-02-27 DIAGNOSIS — Z6836 Body mass index (BMI) 36.0-36.9, adult: Secondary | ICD-10-CM | POA: Diagnosis not present

## 2018-03-05 DIAGNOSIS — H6123 Impacted cerumen, bilateral: Secondary | ICD-10-CM | POA: Diagnosis not present

## 2018-03-05 DIAGNOSIS — H903 Sensorineural hearing loss, bilateral: Secondary | ICD-10-CM | POA: Diagnosis not present

## 2018-04-01 DIAGNOSIS — M25551 Pain in right hip: Secondary | ICD-10-CM | POA: Diagnosis not present

## 2018-04-01 DIAGNOSIS — M25561 Pain in right knee: Secondary | ICD-10-CM | POA: Diagnosis not present

## 2018-04-01 DIAGNOSIS — Z96651 Presence of right artificial knee joint: Secondary | ICD-10-CM | POA: Diagnosis not present

## 2018-04-01 DIAGNOSIS — M1611 Unilateral primary osteoarthritis, right hip: Secondary | ICD-10-CM | POA: Diagnosis not present

## 2018-04-01 DIAGNOSIS — Z471 Aftercare following joint replacement surgery: Secondary | ICD-10-CM | POA: Diagnosis not present

## 2018-05-22 DIAGNOSIS — Z9861 Coronary angioplasty status: Secondary | ICD-10-CM | POA: Diagnosis not present

## 2018-05-22 DIAGNOSIS — M25561 Pain in right knee: Secondary | ICD-10-CM | POA: Diagnosis not present

## 2018-05-22 DIAGNOSIS — E119 Type 2 diabetes mellitus without complications: Secondary | ICD-10-CM | POA: Diagnosis not present

## 2018-05-22 DIAGNOSIS — Z6837 Body mass index (BMI) 37.0-37.9, adult: Secondary | ICD-10-CM | POA: Diagnosis not present

## 2018-05-22 DIAGNOSIS — I10 Essential (primary) hypertension: Secondary | ICD-10-CM | POA: Diagnosis not present

## 2018-05-22 DIAGNOSIS — I252 Old myocardial infarction: Secondary | ICD-10-CM | POA: Diagnosis not present

## 2018-05-22 DIAGNOSIS — E7849 Other hyperlipidemia: Secondary | ICD-10-CM | POA: Diagnosis not present

## 2018-05-22 DIAGNOSIS — I5020 Unspecified systolic (congestive) heart failure: Secondary | ICD-10-CM | POA: Diagnosis not present

## 2018-06-17 ENCOUNTER — Encounter (INDEPENDENT_AMBULATORY_CARE_PROVIDER_SITE_OTHER): Payer: PPO | Admitting: Ophthalmology

## 2018-08-27 DIAGNOSIS — Z23 Encounter for immunization: Secondary | ICD-10-CM | POA: Diagnosis not present

## 2018-10-09 ENCOUNTER — Encounter: Payer: Self-pay | Admitting: Cardiology

## 2018-11-05 ENCOUNTER — Ambulatory Visit: Payer: PPO | Admitting: Cardiology

## 2018-11-12 ENCOUNTER — Ambulatory Visit (INDEPENDENT_AMBULATORY_CARE_PROVIDER_SITE_OTHER): Payer: HMO | Admitting: Cardiology

## 2018-11-12 ENCOUNTER — Encounter: Payer: Self-pay | Admitting: Cardiology

## 2018-11-12 VITALS — BP 128/62 | HR 88 | Ht 68.0 in | Wt 241.8 lb

## 2018-11-12 DIAGNOSIS — I2510 Atherosclerotic heart disease of native coronary artery without angina pectoris: Secondary | ICD-10-CM

## 2018-11-12 DIAGNOSIS — I10 Essential (primary) hypertension: Secondary | ICD-10-CM | POA: Diagnosis not present

## 2018-11-12 DIAGNOSIS — Z125 Encounter for screening for malignant neoplasm of prostate: Secondary | ICD-10-CM | POA: Diagnosis not present

## 2018-11-12 DIAGNOSIS — E785 Hyperlipidemia, unspecified: Secondary | ICD-10-CM | POA: Diagnosis not present

## 2018-11-12 DIAGNOSIS — E119 Type 2 diabetes mellitus without complications: Secondary | ICD-10-CM | POA: Diagnosis not present

## 2018-11-12 DIAGNOSIS — E7849 Other hyperlipidemia: Secondary | ICD-10-CM | POA: Diagnosis not present

## 2018-11-12 NOTE — Progress Notes (Signed)
11/12/2018 SARIM ROTHMAN   1951-02-11  [id number]  Primary Physician Marton Redwood, MD Primary Cardiologist: Lauree Chandler, MD  Electrophysiologist: None   Reason for Visit/CC: f/u for CAD   HPI:  Vincent [name] is a 68 y.o. male who is being seen today for yearly f/u for CAD. Also reports new CP.   He is a 68y/o male with history of CAD, HTN, HLD who is here today for cardiac follow up. He has been followed in the past by Dr. Lia Foyer and now followed by Dr. Angelena Form. He had an anterior MI in 2007 with stent placed in LAD at that time. Last cath 06/23/10 per Dr. Lia Foyer with 30-40% proximal LAD restenosis, 30% mid LAD, 40% mid Circumflex, no RCA disease, LVEF=45-50%. Stress myoview April 2015 with no ischemia. He was seen in the ED January 2016 with chest pain while upset. Workup negative. Started on Imdur. No chest pain since. I evaluated him in January 2019 and he was doing well w/o angina. Lipids were checked by PCP and LDL was 60. He was advised to continue medical therapy and f/u in 1 year.   He is back today for his 1 year f/u. Since his last OV, he underwent routine screening for AAA. He had an ultrasound 11/2017 ordered by PCP that showed no evidence of AAA.    He is here today with his wife.  They expressed concerns given recent chest discomfort.  Has been occurring off and on for the past several months.  His wife reports he had a severe episode earlier this week.  Described as left-sided chest discomfort with some occasional radiation down the left arm.  Can occur at rest.  Feels a bit similar to the chest discomfort he had prior to his MI but not that severe.  Lasts a few minutes and resolve spontaneously.  He has not had to use any nitroglycerin.  He is not that active given orthopedic problems.  He has bad hip and knee arthritis and will ultimately need hip and knee surgery but reports that his orthopedic doctor stated that he wanted to him to get cleared by cardiology first.   He is currently chest pain-free in clinic today.  EKG shows normal sinus rhythm.  Heart rate 86 bpm with nonspecific T wave abnormalities.  Blood pressure is well controlled at 128/62.  He reports full medication compliance.   Cardiac Studies   NST 06/2016 Study Highlights    Nuclear stress EF: 37%.  There was no ST segment deviation noted during stress.  Defect 1: There is a small defect of moderate severity present in the apical anterior, apical lateral and apex location.  Findings consistent with prior myocardial infarction.  This is an intermediate risk study.  The left ventricular ejection fraction is moderately decreased (30-44%).     Vascular US Aorta 11/21/17 Final Interpretation: Abdominal Aorta: No evidence of an abdominal aortic aneurysm was visualized. Right and left iliac arteries were not visualized.  *See table(s) above for measurements and observations.  Current Meds  Medication Sig  . aspirin EC 81 MG tablet Take 1 tablet (81 mg total) by mouth daily.  . furosemide (LASIX) 40 MG tablet Take 1 tablet (40 mg total) by mouth 2 (two) times daily.  Marland Kitchen HYDROcodone-acetaminophen (NORCO/VICODIN) 5-325 MG tablet TAKE 1 TABLET BY MOUTH EVERY 8 HOURS AS NEEDED FOR SEVERE PAIN  . isosorbide mononitrate (IMDUR) 30 MG 24 hr tablet Take 1 tablet (30 mg total) by mouth daily.  Marland Kitchen  JARDIANCE 25 MG TABS tablet Take 25 mg by mouth daily.  Marland Kitchen LORazepam (ATIVAN) 1 MG tablet Take 1 mg by mouth every 8 (eight) hours as needed for anxiety. AS NEEDED FOR ANXIETY  . meloxicam (MOBIC) 7.5 MG tablet Take 1 tablet (7.5 mg total) by mouth daily.  . metFORMIN (GLUCOPHAGE) 1000 MG tablet Take 1,000 mg by mouth 2 (two) times daily.  . metoprolol tartrate (LOPRESSOR) 50 MG tablet Take 1 tablet (50 mg total) by mouth 2 (two) times daily.  . nitroGLYCERIN (NITROSTAT) 0.4 MG SL tablet Place 1 tablet (0.4 mg total) under the tongue every 5 (five) minutes as needed for chest pain.  . simvastatin (ZOCOR)  40 MG tablet Take 1 tablet (40 mg total) by mouth at bedtime.  . traMADol (ULTRAM) 50 MG tablet Take 50 mg by mouth every 6 (six) hours as needed.   Current Facility-Administered Medications for the 11/12/18 encounter (Office Visit) with Consuelo Pandy, PA-C  Medication  . 0.9 %  sodium chloride infusion   Allergies  Allergen Reactions  . Penicillins Other (See Comments)    Has patient had a PCN reaction causing immediate rash, facial/tongue/throat swelling, SOB or lightheadedness with hypotension: Unknown Has patient had a PCN reaction causing severe rash involving mucus membranes or skin necrosis: Unknown Has patient had a PCN reaction that required hospitalization: Unknown Has patient had a PCN reaction occurring within the last 10 years: No If all of the above answers are "NO", then may proceed with Cephalosporin use.    Past Medical History:  Diagnosis Date  . Acute MI ([facility])   . Allergy   . Anxiety   . Arthritis   . Coronary artery disease   . Depression   . Diabetes mellitus without complication ([facility])   . Diverticulosis    mild  . Hemorrhoids   . HOH (hard of hearing)   . Hx of colonic polyps   . Hyperlipidemia   . Hypertension   . Left ventricular dysfunction    hx of   Family History  Problem Relation Age of Onset  . Coronary artery disease Other        positive family hx of  . Colon cancer Neg Hx   . Esophageal cancer Neg Hx   . Liver cancer Neg Hx   . Pancreatic cancer Neg Hx   . Rectal cancer Neg Hx   . Stomach cancer Neg Hx    Past Surgical History:  Procedure Laterality Date  . ANGIOPLASTY     and bare metal stent placement  . ANKLE SURGERY Right 1986   i&d abscess- pt states "put a bone in it"  . CORONARY ANGIOPLASTY WITH STENT PLACEMENT    . KNEE ARTHROPLASTY Right 08/09/2016   Procedure: RIGHT TOTAL KNEE ARTHROPLASTY WITH COMPUTER NAVIGATION;  Surgeon: Rod Can, MD;  Location: [facility];  Service: Orthopedics;  Laterality: Right;  Needs  RNFA   Social History   Socioeconomic History  . Marital status: Married    Spouse name: Not on file  . Number of children: Not on file  . Years of education: Not on file  . Highest education level: Not on file  Occupational History  . Occupation: full time    Employer: [employer]  Social Needs  . Financial resource strain: Not on file  . Food insecurity:    Worry: Not on file    Inability: Not on file  . Transportation needs:    Medical: Not on file  Non-medical: Not on file  Tobacco Use  . Smoking status: Former Smoker    Types: Cigars    Last attempt to quit: 12/17/2005    Years since quitting: 12.9  . Smokeless tobacco: Former Systems developer    Quit date: 12/17/2005  Substance and Sexual Activity  . Alcohol use: No  . Drug use: No  . Sexual activity: Not on file  Lifestyle  . Physical activity:    Days per week: Not on file    Minutes per session: Not on file  . Stress: Not on file  Relationships  . Social connections:    Talks on phone: Not on file    Gets together: Not on file    Attends religious service: Not on file    Active member of club or organization: Not on file    Attends meetings of clubs or organizations: Not on file    Relationship status: Not on file  . Intimate partner violence:    Fear of current or ex partner: Not on file    Emotionally abused: Not on file    Physically abused: Not on file    Forced sexual activity: Not on file  Other Topics Concern  . Not on file  Social History Narrative  . Not on file     Review of Systems: General: negative for chills, fever, night sweats or weight changes.  Cardiovascular: negative for chest pain, dyspnea on exertion, edema, orthopnea, palpitations, paroxysmal nocturnal dyspnea or shortness of breath Dermatological: negative for rash Respiratory: negative for cough or wheezing Urologic: negative for hematuria Abdominal: negative for nausea, vomiting, diarrhea, bright red blood per rectum,  melena, or hematemesis Neurologic: negative for visual changes, syncope, or dizziness All other systems reviewed and are otherwise negative except as noted above.   Physical Exam:  Blood pressure 128/62, pulse 88, height 5\' 8"  (1.727 m), weight 241 lb 12.8 oz (109.7 kg), SpO2 94 %.  General appearance: obese middle aged WM, alert, cooperative and no distress Neck: no carotid bruit and no JVD Lungs: clear to auscultation bilaterally Heart: regular rate and rhythm, S1, S2 normal, no murmur, click, rub or gallop Extremities: extremities normal, atraumatic, no cyanosis or edema Pulses: 2+ and symmetric Skin: Skin color, texture, turgor normal. No rashes or lesions Neurologic: Grossly normal  EKG NSR 86 bpm -- personally reviewed   ASSESSMENT AND PLAN:   1. CAD w/ Unstable Angina: h/o LAD stent in 2007.  Repeat cardiac catheterization in 2011 showed mild CAD.  Nuclear stress test in 2015 low risk for ischemia.  He now complains of recent left-sided chest discomfort concerning for unstable angina.  He reports that he will likely need to undergo hip and knee replacement surgery at some point given severe arthritis.  Given his history and nature of his symptoms and anticipation of orthopedic surgery, he will need cardiac catheterization to rule out obstructive CAD. I have reviewed the risks, indications, and alternatives to cardiac catheterization and possible angioplasty/stenting with the patient. Risks include but are not limited to bleeding, infection, vascular injury, stroke, myocardial infection, arrhythmia, kidney injury, radiation-related injury in the case of prolonged fluoroscopy use, emergency cardiac surgery, and death. The patient understands the risks of serious complication is low (<0%).  Patient reports that he was seen by his PCP, Dr. Marton Redwood, earlier this morning and had lab work done at his office.  Will contact Dr. Raul Del office to see if they can fax over laboratory work for our  review/precath assessment.  Will advise patient to hold metformin morning of cardiac catheterization as well as his Lasix. Will write new Rx for PRN SLNTG. Pt advised to go to the ED if severe symptoms, unrelieved w/ nitro, between now and his cath.   2. HTN: controlled on current regimen.  Will obtain laboratory work from PCP office.  Patient had BMP today.  3. HLD: on statin therapy.  Lipids followed by PCP.  He reports he had lab work done earlier today.  Request results from PCP office.   Follow-Up post cath.   Taivon Haroon Ladoris Gene, MHS [facility] 11/12/2018 12:03 PM

## 2018-11-12 NOTE — H&P (View-Only) (Signed)
11/12/2018 Vincent [name]   05/25/1951  [id number]  Primary Physician Marton Redwood, MD Primary Cardiologist: Lauree Chandler, MD  Electrophysiologist: None   Reason for Visit/CC: f/u for CAD   HPI:  Vincent [name] is a 68 y.o. male who is being seen today for yearly f/u for CAD. Also reports new CP.   He is a 68y/o male with history of CAD, HTN, HLD who is here today for cardiac follow up. He has been followed in the past by Dr. Lia Foyer and now followed by Dr. Angelena Form. He had an anterior MI in 2007 with stent placed in LAD at that time. Last cath 06/23/10 per Dr. Lia Foyer with 30-40% proximal LAD restenosis, 30% mid LAD, 40% mid Circumflex, no RCA disease, LVEF=45-50%. Stress myoview April 2015 with no ischemia. He was seen in the ED January 2016 with chest pain while upset. Workup negative. Started on Imdur. No chest pain since. I evaluated him in January 2019 and he was doing well w/o angina. Lipids were checked by PCP and LDL was 60. He was advised to continue medical therapy and f/u in 1 year.   He is back today for his 1 year f/u. Since his last OV, he underwent routine screening for AAA. He had an ultrasound 11/2017 ordered by PCP that showed no evidence of AAA.    He is here today with his wife.  They expressed concerns given recent chest discomfort.  Has been occurring off and on for the past several months.  His wife reports he had a severe episode earlier this week.  Described as left-sided chest discomfort with some occasional radiation down the left arm.  Can occur at rest.  Feels a bit similar to the chest discomfort he had prior to his MI but not that severe.  Lasts a few minutes and resolve spontaneously.  He has not had to use any nitroglycerin.  He is not that active given orthopedic problems.  He has bad hip and knee arthritis and will ultimately need hip and knee surgery but reports that his orthopedic doctor stated that he wanted to him to get cleared by cardiology first.   He is currently chest pain-free in clinic today.  EKG shows normal sinus rhythm.  Heart rate 86 bpm with nonspecific T wave abnormalities.  Blood pressure is well controlled at 128/62.  He reports full medication compliance.   Cardiac Studies   NST 06/2016 Study Highlights    Nuclear stress EF: 37%.  There was no ST segment deviation noted during stress.  Defect 1: There is a small defect of moderate severity present in the apical anterior, apical lateral and apex location.  Findings consistent with prior myocardial infarction.  This is an intermediate risk study.  The left ventricular ejection fraction is moderately decreased (30-44%).     Vascular US Aorta 11/21/17 Final Interpretation: Abdominal Aorta: No evidence of an abdominal aortic aneurysm was visualized. Right and left iliac arteries were not visualized.  *See table(s) above for measurements and observations.  Current Meds  Medication Sig  . aspirin EC 81 MG tablet Take 1 tablet (81 mg total) by mouth daily.  . furosemide (LASIX) 40 MG tablet Take 1 tablet (40 mg total) by mouth 2 (two) times daily.  Marland Kitchen HYDROcodone-acetaminophen (NORCO/VICODIN) 5-325 MG tablet TAKE 1 TABLET BY MOUTH EVERY 8 HOURS AS NEEDED FOR SEVERE PAIN  . isosorbide mononitrate (IMDUR) 30 MG 24 hr tablet Take 1 tablet (30 mg total) by mouth daily.  Marland Kitchen  JARDIANCE 25 MG TABS tablet Take 25 mg by mouth daily.  Marland Kitchen LORazepam (ATIVAN) 1 MG tablet Take 1 mg by mouth every 8 (eight) hours as needed for anxiety. AS NEEDED FOR ANXIETY  . meloxicam (MOBIC) 7.5 MG tablet Take 1 tablet (7.5 mg total) by mouth daily.  . metFORMIN (GLUCOPHAGE) 1000 MG tablet Take 1,000 mg by mouth 2 (two) times daily.  . metoprolol tartrate (LOPRESSOR) 50 MG tablet Take 1 tablet (50 mg total) by mouth 2 (two) times daily.  . nitroGLYCERIN (NITROSTAT) 0.4 MG SL tablet Place 1 tablet (0.4 mg total) under the tongue every 5 (five) minutes as needed for chest pain.  . simvastatin (ZOCOR)  40 MG tablet Take 1 tablet (40 mg total) by mouth at bedtime.  . traMADol (ULTRAM) 50 MG tablet Take 50 mg by mouth every 6 (six) hours as needed.   Current Facility-Administered Medications for the 11/12/18 encounter (Office Visit) with Consuelo Pandy, PA-C  Medication  . 0.9 %  sodium chloride infusion   Allergies  Allergen Reactions  . Penicillins Other (See Comments)    Has patient had a PCN reaction causing immediate rash, facial/tongue/throat swelling, SOB or lightheadedness with hypotension: Unknown Has patient had a PCN reaction causing severe rash involving mucus membranes or skin necrosis: Unknown Has patient had a PCN reaction that required hospitalization: Unknown Has patient had a PCN reaction occurring within the last 10 years: No If all of the above answers are "NO", then may proceed with Cephalosporin use.    Past Medical History:  Diagnosis Date  . Acute MI ([place])   . Allergy   . Anxiety   . Arthritis   . Coronary artery disease   . Depression   . Diabetes mellitus without complication ([place])   . Diverticulosis    mild  . Hemorrhoids   . HOH (hard of hearing)   . Hx of colonic polyps   . Hyperlipidemia   . Hypertension   . Left ventricular dysfunction    hx of   Family History  Problem Relation Age of Onset  . Coronary artery disease Other        positive family hx of  . Colon cancer Neg Hx   . Esophageal cancer Neg Hx   . Liver cancer Neg Hx   . Pancreatic cancer Neg Hx   . Rectal cancer Neg Hx   . Stomach cancer Neg Hx    Past Surgical History:  Procedure Laterality Date  . ANGIOPLASTY     and bare metal stent placement  . ANKLE SURGERY Right 1986   i&d abscess- pt states "put a bone in it"  . CORONARY ANGIOPLASTY WITH STENT PLACEMENT    . KNEE ARTHROPLASTY Right 08/09/2016   Procedure: RIGHT TOTAL KNEE ARTHROPLASTY WITH COMPUTER NAVIGATION;  Surgeon: Rod Can, MD;  Location: [facility];  Service: Orthopedics;  Laterality: Right;  Needs  RNFA   Social History   Socioeconomic History  . Marital status: Married    Spouse name: Not on file  . Number of children: Not on file  . Years of education: Not on file  . Highest education level: Not on file  Occupational History  . Occupation: full time    Employer: [employer]  Social Needs  . Financial resource strain: Not on file  . Food insecurity:    Worry: Not on file    Inability: Not on file  . Transportation needs:    Medical: Not on file  Non-medical: Not on file  Tobacco Use  . Smoking status: Former Smoker    Types: Cigars    Last attempt to quit: 12/17/2005    Years since quitting: 12.9  . Smokeless tobacco: Former Systems developer    Quit date: 12/17/2005  Substance and Sexual Activity  . Alcohol use: No  . Drug use: No  . Sexual activity: Not on file  Lifestyle  . Physical activity:    Days per week: Not on file    Minutes per session: Not on file  . Stress: Not on file  Relationships  . Social connections:    Talks on phone: Not on file    Gets together: Not on file    Attends religious service: Not on file    Active member of club or organization: Not on file    Attends meetings of clubs or organizations: Not on file    Relationship status: Not on file  . Intimate partner violence:    Fear of current or ex partner: Not on file    Emotionally abused: Not on file    Physically abused: Not on file    Forced sexual activity: Not on file  Other Topics Concern  . Not on file  Social History Narrative  . Not on file     Review of Systems: General: negative for chills, fever, night sweats or weight changes.  Cardiovascular: negative for chest pain, dyspnea on exertion, edema, orthopnea, palpitations, paroxysmal nocturnal dyspnea or shortness of breath Dermatological: negative for rash Respiratory: negative for cough or wheezing Urologic: negative for hematuria Abdominal: negative for nausea, vomiting, diarrhea, bright red blood per rectum,  melena, or hematemesis Neurologic: negative for visual changes, syncope, or dizziness All other systems reviewed and are otherwise negative except as noted above.   Physical Exam:  Blood pressure 128/62, pulse 88, height 5\' 8"  (1.727 m), weight 241 lb 12.8 oz (109.7 kg), SpO2 94 %.  General appearance: obese middle aged WM, alert, cooperative and no distress Neck: no carotid bruit and no JVD Lungs: clear to auscultation bilaterally Heart: regular rate and rhythm, S1, S2 normal, no murmur, click, rub or gallop Extremities: extremities normal, atraumatic, no cyanosis or edema Pulses: 2+ and symmetric Skin: Skin color, texture, turgor normal. No rashes or lesions Neurologic: Grossly normal  EKG NSR 86 bpm -- personally reviewed   ASSESSMENT AND PLAN:   1. CAD w/ Unstable Angina: h/o LAD stent in 2007.  Repeat cardiac catheterization in 2011 showed mild CAD.  Nuclear stress test in 2015 low risk for ischemia.  He now complains of recent left-sided chest discomfort concerning for unstable angina.  He reports that he will likely need to undergo hip and knee replacement surgery at some point given severe arthritis.  Given his history and nature of his symptoms and anticipation of orthopedic surgery, he will need cardiac catheterization to rule out obstructive CAD. I have reviewed the risks, indications, and alternatives to cardiac catheterization and possible angioplasty/stenting with the patient. Risks include but are not limited to bleeding, infection, vascular injury, stroke, myocardial infection, arrhythmia, kidney injury, radiation-related injury in the case of prolonged fluoroscopy use, emergency cardiac surgery, and death. The patient understands the risks of serious complication is low (<1%).  Patient reports that he was seen by his PCP, Dr. Marton Redwood, earlier this morning and had lab work done at his office.  Will contact Dr. Raul Del office to see if they can fax over laboratory work for our  review/precath assessment.  Will advise patient to hold metformin morning of cardiac catheterization as well as his Lasix. Will write new Rx for PRN SLNTG. Pt advised to go to the ED if severe symptoms, unrelieved w/ nitro, between now and his cath.   2. HTN: controlled on current regimen.  Will obtain laboratory work from PCP office.  Patient had BMP today.  3. HLD: on statin therapy.  Lipids followed by PCP.  He reports he had lab work done earlier today.  Request results from PCP office.   Follow-Up post cath.   Larayah Clute Ladoris Gene, MHS [facility] 11/12/2018 12:03 PM

## 2018-11-12 NOTE — Patient Instructions (Signed)
Medication Instructions:  NONE If you need a refill on your cardiac medications before your next appointment, please call your pharmacy.   Lab work: NONE If you have labs (blood work) drawn today and your tests are completely normal, you will receive your results only by: Marland Kitchen MyChart Message (if you have MyChart) OR . A paper copy in the mail If you have any lab test that is abnormal or we need to change your treatment, we will call you to review the results.  Testing/Procedures: Your physician has requested that you have a cardiac catheterization. Cardiac catheterization is used to diagnose and/or treat various heart conditions. Doctors may recommend this procedure for a number of different reasons. The most common reason is to evaluate chest pain. Chest pain can be a symptom of coronary artery disease (CAD), and cardiac catheterization can show whether plaque is narrowing or blocking your heart's arteries. This procedure is also used to evaluate the valves, as well as measure the blood flow and oxygen levels in different parts of your heart. For further information please visit HugeFiesta.tn. Please follow instruction sheet, as given.    Follow-Up: 2 weeks POST 2/14 WITH Brittainy Simmons  At [facility], you and your health needs are our priority.  As part of our continuing mission to provide you with exceptional heart care, we have created designated Provider Care Teams.  These Care Teams include your primary Cardiologist (physician) and Advanced Practice Providers (APPs -  Physician Assistants and Nurse Practitioners) who all work together to provide you with the care you need, when you need it. .   Any Other Special Instructions Will Be Listed Below (If Applicable).    [place] OFFICE [place], [place] [address] Dept: [phone] Loc: [place]  11/12/2018  You are scheduled for a Cardiac Catheterization on Friday, February 14 with Dr. Lauree Chandler.  1. Please arrive at the [facility] (Main Entrance A) at [facility]: [address] [place], [place] [address] at 5:30 AM (This time is two hours before your procedure to ensure your preparation). Free valet parking service is available.   Special note: Every effort is made to have your procedure done on time. Please understand that emergencies sometimes delay scheduled procedures.  2. Diet: Do not eat solid foods after midnight.  The patient may have clear liquids until 5am upon the day of the procedure.  4. Medication instructions in preparation for your procedure: MORNING OF PROCEDURE 2/14 FRIDAY HOLD LASIX [place]     On the morning of your procedure, take your Aspirin 81 mg and any morning medicines NOT listed above.  You may use sips of water.  5. Plan for one night stay--bring personal belongings. 6. Bring a current list of your medications and current insurance cards. 7. You MUST have a responsible person to drive you home. 8. Someone MUST be with you the first 24 hours after you arrive home or your discharge will be delayed. 9. Please wear clothes that are easy to get on and off and wear slip-on shoes.  Thank you for allowing [place] to care for you!   -- [facility]

## 2018-11-14 ENCOUNTER — Other Ambulatory Visit: Payer: Self-pay | Admitting: Cardiology

## 2018-11-17 ENCOUNTER — Telehealth: Payer: Self-pay

## 2018-11-17 NOTE — Telephone Encounter (Signed)
Notes recorded by Frederik Schmidt, RN on 11/17/2018 at 8:04 AM EST The patient's wife has been notified of the result and verbalized understanding. All questions (if any) were answered. Frederik Schmidt, RN 11/17/2018 8:04 AM

## 2018-11-17 NOTE — Telephone Encounter (Signed)
-----   Message from Consuelo Pandy, [place] sent at 11/16/2018  4:33 PM EST ----- precath labs ok. Cholesterol is good with current medication. No changes at this time. Continue with plans for cath.

## 2018-11-19 DIAGNOSIS — Z6836 Body mass index (BMI) 36.0-36.9, adult: Secondary | ICD-10-CM | POA: Diagnosis not present

## 2018-11-19 DIAGNOSIS — I10 Essential (primary) hypertension: Secondary | ICD-10-CM | POA: Diagnosis not present

## 2018-11-19 DIAGNOSIS — G8929 Other chronic pain: Secondary | ICD-10-CM | POA: Diagnosis not present

## 2018-11-19 DIAGNOSIS — N183 Chronic kidney disease, stage 3 (moderate): Secondary | ICD-10-CM | POA: Diagnosis not present

## 2018-11-19 DIAGNOSIS — E1129 Type 2 diabetes mellitus with other diabetic kidney complication: Secondary | ICD-10-CM | POA: Diagnosis not present

## 2018-11-19 DIAGNOSIS — I5020 Unspecified systolic (congestive) heart failure: Secondary | ICD-10-CM | POA: Diagnosis not present

## 2018-11-19 DIAGNOSIS — F39 Unspecified mood [affective] disorder: Secondary | ICD-10-CM | POA: Diagnosis not present

## 2018-11-19 DIAGNOSIS — Z1331 Encounter for screening for depression: Secondary | ICD-10-CM | POA: Diagnosis not present

## 2018-11-19 DIAGNOSIS — E7849 Other hyperlipidemia: Secondary | ICD-10-CM | POA: Diagnosis not present

## 2018-11-19 DIAGNOSIS — Z23 Encounter for immunization: Secondary | ICD-10-CM | POA: Diagnosis not present

## 2018-11-19 DIAGNOSIS — Z0000 Encounter for general adult medical examination without abnormal findings: Secondary | ICD-10-CM | POA: Diagnosis not present

## 2018-11-19 DIAGNOSIS — I208 Other forms of angina pectoris: Secondary | ICD-10-CM | POA: Diagnosis not present

## 2018-11-20 ENCOUNTER — Telehealth: Payer: Self-pay | Admitting: *Deleted

## 2018-11-20 DIAGNOSIS — Z1212 Encounter for screening for malignant neoplasm of rectum: Secondary | ICD-10-CM | POA: Diagnosis not present

## 2018-11-20 NOTE — Telephone Encounter (Signed)
Pt contacted pre-catheterization scheduled at [facility] for: Friday November 21, 2018 7:30 AM Verified arrival time and place: [place] Entrance A at: 5:30 AM  No solid food after midnight prior to cath, clear liquids until 5 AM day of procedure. Contrast allergy: no  Hold: Furosemide-day before and day of procedure-GFR 46 Altace-day before and day of procedure-GFR 46 Mobic-day before and day of procedure GFR 46 Metformin-day of procedure and 48 hours post procedure. Jardiance-PM prior to  procedure.  Except hold medications AM meds can be  taken pre-cath with sip of water including: ASA 81 mg  Confirmed patient has responsible person to drive home post procedure and observe 24 hours after arriving home: yes  I asked pt's wife to encourage patient to increase water intake today.  I reviewed instructions with patient's wife, she verbalized understanding, thanked me for call.

## 2018-11-21 ENCOUNTER — Encounter (HOSPITAL_COMMUNITY): Payer: Self-pay | Admitting: Cardiovascular Disease

## 2018-11-21 ENCOUNTER — Other Ambulatory Visit: Payer: Self-pay

## 2018-11-21 ENCOUNTER — Encounter (HOSPITAL_COMMUNITY)
Admission: RE | Disposition: A | Discharge status: Home / Self Care | Payer: Self-pay | Source: Ambulatory Visit | Attending: Cardiovascular Disease

## 2018-11-21 ENCOUNTER — Ambulatory Visit (HOSPITAL_COMMUNITY)
Admission: RE | Admit: 2018-11-21 | Discharge: 2018-11-21 | Disposition: A | Discharge status: Home / Self Care | Payer: HMO | Source: Ambulatory Visit | Attending: Cardiovascular Disease | Admitting: Cardiovascular Disease

## 2018-11-21 DIAGNOSIS — I2510 Atherosclerotic heart disease of native coronary artery without angina pectoris: Secondary | ICD-10-CM

## 2018-11-21 DIAGNOSIS — Z79899 Other long term (current) drug therapy: Secondary | ICD-10-CM | POA: Insufficient documentation

## 2018-11-21 DIAGNOSIS — Z880 Allergy status to penicillin: Secondary | ICD-10-CM | POA: Insufficient documentation

## 2018-11-21 DIAGNOSIS — Z791 Long term (current) use of non-steroidal anti-inflammatories (NSAID): Secondary | ICD-10-CM | POA: Insufficient documentation

## 2018-11-21 DIAGNOSIS — Z955 Presence of coronary angioplasty implant and graft: Secondary | ICD-10-CM | POA: Insufficient documentation

## 2018-11-21 DIAGNOSIS — E119 Type 2 diabetes mellitus without complications: Secondary | ICD-10-CM | POA: Insufficient documentation

## 2018-11-21 DIAGNOSIS — I25110 Atherosclerotic heart disease of native coronary artery with unstable angina pectoris: Secondary | ICD-10-CM | POA: Insufficient documentation

## 2018-11-21 DIAGNOSIS — Z8249 Family history of ischemic heart disease and other diseases of the circulatory system: Secondary | ICD-10-CM | POA: Insufficient documentation

## 2018-11-21 DIAGNOSIS — Z7982 Long term (current) use of aspirin: Secondary | ICD-10-CM | POA: Insufficient documentation

## 2018-11-21 DIAGNOSIS — F329 Major depressive disorder, single episode, unspecified: Secondary | ICD-10-CM | POA: Insufficient documentation

## 2018-11-21 DIAGNOSIS — Z7984 Long term (current) use of oral hypoglycemic drugs: Secondary | ICD-10-CM | POA: Diagnosis not present

## 2018-11-21 DIAGNOSIS — E785 Hyperlipidemia, unspecified: Secondary | ICD-10-CM | POA: Diagnosis not present

## 2018-11-21 DIAGNOSIS — I252 Old myocardial infarction: Secondary | ICD-10-CM | POA: Diagnosis not present

## 2018-11-21 DIAGNOSIS — I10 Essential (primary) hypertension: Secondary | ICD-10-CM | POA: Diagnosis not present

## 2018-11-21 DIAGNOSIS — F17290 Nicotine dependence, other tobacco product, uncomplicated: Secondary | ICD-10-CM | POA: Diagnosis not present

## 2018-11-21 DIAGNOSIS — R072 Precordial pain: Secondary | ICD-10-CM

## 2018-11-21 HISTORY — PX: LEFT HEART CATH AND CORONARY ANGIOGRAPHY: CATH118249

## 2018-11-21 LAB — GLUCOSE, CAPILLARY: Glucose-Capillary: 110 mg/dL — ABNORMAL HIGH (ref 70–99)

## 2018-11-21 SURGERY — LEFT HEART CATH AND CORONARY ANGIOGRAPHY
Anesthesia: LOCAL

## 2018-11-21 MED ORDER — IOHEXOL 350 MG/ML SOLN
INTRAVENOUS | Status: DC | PRN
  Administered 2018-11-21: 90 mL via INTRACARDIAC

## 2018-11-21 MED ORDER — MIDAZOLAM HCL 2 MG/2ML IJ SOLN
INTRAMUSCULAR | Status: DC | PRN
  Administered 2018-11-21: 1 mg via INTRAVENOUS

## 2018-11-21 MED ORDER — SODIUM CHLORIDE 0.9 % WEIGHT BASED INFUSION
3.0000 mL/kg/h | INTRAVENOUS | Status: AC
  Administered 2018-11-21: 3 mL/kg/h via INTRAVENOUS

## 2018-11-21 MED ORDER — SODIUM CHLORIDE 0.9% FLUSH: 3.0000 mL | INTRAVENOUS | Status: DC | PRN

## 2018-11-21 MED ORDER — ACETAMINOPHEN 325 MG PO TABS: 650.0000 mg | ORAL_TABLET | ORAL | Status: DC | PRN

## 2018-11-21 MED ORDER — SODIUM CHLORIDE 0.9% FLUSH: 3.0000 mL | Freq: Two times a day (BID) | INTRAVENOUS | Status: DC

## 2018-11-21 MED ORDER — ASPIRIN 81 MG PO CHEW: 81.0000 mg | CHEWABLE_TABLET | ORAL | Status: DC

## 2018-11-21 MED ORDER — HEPARIN SODIUM (PORCINE) 1000 UNIT/ML IJ SOLN
INTRAMUSCULAR | Status: AC
  Filled 2018-11-21: qty 1

## 2018-11-21 MED ORDER — SODIUM CHLORIDE 0.9 % WEIGHT BASED INFUSION
1.0000 mL/kg/h | INTRAVENOUS | Status: DC
  Administered 2018-11-21: 1 mL/kg/h via INTRAVENOUS

## 2018-11-21 MED ORDER — SODIUM CHLORIDE 0.9 % IV SOLN: INTRAVENOUS | Status: AC

## 2018-11-21 MED ORDER — LIDOCAINE HCL (PF) 1 % IJ SOLN
INTRAMUSCULAR | Status: DC | PRN
  Administered 2018-11-21: 2 mL via INTRADERMAL

## 2018-11-21 MED ORDER — FENTANYL CITRATE (PF) 100 MCG/2ML IJ SOLN
INTRAMUSCULAR | Status: AC
  Filled 2018-11-21: qty 2

## 2018-11-21 MED ORDER — LIDOCAINE HCL (PF) 1 % IJ SOLN
INTRAMUSCULAR | Status: AC
  Filled 2018-11-21: qty 30

## 2018-11-21 MED ORDER — HEPARIN SODIUM (PORCINE) 1000 UNIT/ML IJ SOLN
INTRAMUSCULAR | Status: DC | PRN
  Administered 2018-11-21: 6000 [IU] via INTRAVENOUS

## 2018-11-21 MED ORDER — SODIUM CHLORIDE 0.9 % IV SOLN: 250.0000 mL | INTRAVENOUS | Status: DC | PRN

## 2018-11-21 MED ORDER — ONDANSETRON HCL 4 MG/2ML IJ SOLN: 4.0000 mg | Freq: Four times a day (QID) | INTRAMUSCULAR | Status: DC | PRN

## 2018-11-21 MED ORDER — FENTANYL CITRATE (PF) 100 MCG/2ML IJ SOLN
INTRAMUSCULAR | Status: DC | PRN
  Administered 2018-11-21: 25 ug via INTRAVENOUS

## 2018-11-21 MED ORDER — HEPARIN (PORCINE) IN NACL 1000-0.9 UT/500ML-% IV SOLN
INTRAVENOUS | Status: AC
  Filled 2018-11-21: qty 1000

## 2018-11-21 MED ORDER — MIDAZOLAM HCL 2 MG/2ML IJ SOLN
INTRAMUSCULAR | Status: AC
  Filled 2018-11-21: qty 2

## 2018-11-21 MED ORDER — VERAPAMIL HCL 2.5 MG/ML IV SOLN
INTRAVENOUS | Status: DC | PRN
  Administered 2018-11-21: 10 mL via INTRA_ARTERIAL

## 2018-11-21 MED ORDER — HEPARIN (PORCINE) IN NACL 1000-0.9 UT/500ML-% IV SOLN
INTRAVENOUS | Status: DC | PRN
  Administered 2018-11-21 (×2): 500 mL

## 2018-11-21 SURGICAL SUPPLY — 10 items
CATH 5FR JL3.5 JR4 ANG PIG MP (CATHETERS) ×1 IMPLANT
DEVICE RAD COMP TR BAND LRG (VASCULAR PRODUCTS) ×1 IMPLANT
GLIDESHEATH SLEND SS 6F .021 (SHEATH) ×1 IMPLANT
GUIDEWIRE INQWIRE 1.5J.035X260 (WIRE) IMPLANT
INQWIRE 1.5J .035X260CM (WIRE) ×2
KIT HEART LEFT (KITS) ×2 IMPLANT
PACK CARDIAC CATHETERIZATION (CUSTOM PROCEDURE TRAY) ×2 IMPLANT
SYR MEDRAD MARK 7 150ML (SYRINGE) ×2 IMPLANT
TRANSDUCER W/STOPCOCK (MISCELLANEOUS) ×2 IMPLANT
TUBING CIL FLEX 10 FLL-RA (TUBING) ×2 IMPLANT

## 2018-11-21 NOTE — Discharge Instructions (Signed)
Radial Site Care ° °This sheet gives you information about how to care for yourself after your procedure. Your health care provider may also give you more specific instructions. If you have problems or questions, contact your health care provider. °What can I expect after the procedure? °After the procedure, it is common to have: °· Bruising and tenderness at the catheter insertion area. °Follow these instructions at home: °Medicines °· Take over-the-counter and prescription medicines only as told by your health care provider. °Insertion site care °· Follow instructions from your health care provider about how to take care of your insertion site. Make sure you: °? Wash your hands with soap and water before you change your bandage (dressing). If soap and water are not available, use hand sanitizer. °? Change your dressing as told by your health care provider. °? Leave stitches (sutures), skin glue, or adhesive strips in place. These skin closures may need to stay in place for 2 weeks or longer. If adhesive strip edges start to loosen and curl up, you may trim the loose edges. Do not remove adhesive strips completely unless your health care provider tells you to do that. °· Check your insertion site every day for signs of infection. Check for: °? Redness, swelling, or pain. °? Fluid or blood. °? Pus or a bad smell. °? Warmth. °· Do not take baths, swim, or use a hot tub until your health care provider approves. °· You may shower 24-48 hours after the procedure, or as directed by your health care provider. °? Remove the dressing and gently wash the site with plain soap and water. °? Pat the area dry with a clean towel. °? Do not rub the site. That could cause bleeding. °· Do not apply powder or lotion to the site. °Activity ° °· For 24 hours after the procedure, or as directed by your health care provider: °? Do not flex or bend the affected arm. °? Do not push or pull heavy objects with the affected arm. °? Do not  drive yourself home from the hospital or clinic. You may drive 24 hours after the procedure unless your health care provider tells you not to. °? Do not operate machinery or power tools. °· Do not lift anything that is heavier than 10 lb (4.5 kg), or the limit that you are told, until your health care provider says that it is safe. °· Ask your health care provider when it is okay to: °? Return to work or school. °? Resume usual physical activities or sports. °? Resume sexual activity. °General instructions °· If the catheter site starts to bleed, raise your arm and put firm pressure on the site. If the bleeding does not stop, get help right away. This is a medical emergency. °· If you went home on the same day as your procedure, a responsible adult should be with you for the first 24 hours after you arrive home. °· Keep all follow-up visits as told by your health care provider. This is important. °Contact a health care provider if: °· You have a fever. °· You have redness, swelling, or yellow drainage around your insertion site. °Get help right away if: °· You have unusual pain at the radial site. °· The catheter insertion area swells very fast. °· The insertion area is bleeding, and the bleeding does not stop when you hold steady pressure on the area. °· Your arm or hand becomes pale, cool, tingly, or numb. °These symptoms may represent a serious problem   that is an emergency. Do not wait to see if the symptoms will go away. Get medical help right away. Call your local emergency services (911 in the [place] Do not drive yourself to the hospital. Summary  After the procedure, it is common to have bruising and tenderness at the site.  Follow instructions from your health care provider about how to take care of your radial site wound. Check the wound every day for signs of infection.  Do not lift anything that is heavier than 10 lb (4.5 kg), or the limit that you are told, until your health care provider says  that it is safe. This information is not intended to replace advice given to you by your health care provider. Make sure you discuss any questions you have with your health care provider. Document Released: 10/27/2010 Document Revised: 10/30/2017 Document Reviewed: 10/30/2017 Elsevier Interactive Patient Education  2019 [place] metformin 48 hours post cath.

## 2018-11-21 NOTE — Interval H&P Note (Signed)
History and Physical Interval Note:  11/21/2018 7:15 AM  Vincent [name]  has presented today for cardiac cath with the diagnosis of unstable angina.  The various methods of treatment have been discussed with the patient and family. After consideration of risks, benefits and other options for treatment, the patient has consented to  Procedure(s): LEFT HEART CATH AND CORONARY ANGIOGRAPHY (N/A) as a surgical intervention .  The patient's history has been reviewed, patient examined, no change in status, stable for surgery.  I have reviewed the patient's chart and labs.  Questions were answered to the patient's satisfaction.    Cath Lab Visit (complete for each Cath Lab visit)  Clinical Evaluation Leading to the Procedure:   ACS: No.  Non-ACS:    Anginal Classification: CCS III  Anti-ischemic medical therapy: Maximal Therapy (2 or more classes of medications)  Non-Invasive Test Results: No non-invasive testing performed  Prior CABG: No previous CABG         Lauree Chandler

## 2018-12-02 ENCOUNTER — Ambulatory Visit: Payer: HMO | Admitting: Cardiology

## 2018-12-02 DIAGNOSIS — R0989 Other specified symptoms and signs involving the circulatory and respiratory systems: Secondary | ICD-10-CM

## 2018-12-31 ENCOUNTER — Encounter (INDEPENDENT_AMBULATORY_CARE_PROVIDER_SITE_OTHER): Payer: Self-pay | Admitting: Ophthalmology

## 2019-01-28 ENCOUNTER — Other Ambulatory Visit: Payer: Self-pay

## 2019-01-28 NOTE — Patient Outreach (Signed)
  [facility]) Care Management Chronic Special Needs Program  01/28/2019  Name: Vincent [name] DOB: 09-Apr-1951  MRN: [id number]  Vincent [name] is enrolled in a Chronic Special Needs Plan. RNCM called to review health risk assessment and complete individualized care plan. Client's wife answered the pone and stated that client does not talk on the phone due to Hard of Hearing. RNCM spoke with wife who is listed on designated release form. Per Mrs. Riel, the line was not clear and request call back. RNCM called back. No answer. Voice message left.   Plan: Chronic care management coordinator will attempt outreach in 1-2 business days if no return call.  Thea Silversmith, RN, MSN, [place] [phone]

## 2019-01-30 ENCOUNTER — Other Ambulatory Visit: Payer: Self-pay

## 2019-01-30 NOTE — Patient Outreach (Signed)
[facility]) Care Management Chronic Special Needs Program  01/30/2019  Name: Vincent [name] DOB: 03-16-1951  MRN: [id number]  [name] is enrolled in a chronic special needs plan for Diabetes. Chronic Care Management Coordinator telephoned client to review health risk assessment and to develop individualized care plan.  Introduced the chronic care management program, importance of client participation, and taking their care plan to all provider appointments and inpatient facilities.    Subjective: RNCM spoke with client's wife, designate party release. Per Mrs. Rezabek, client very hard of hearing and does not talk on the phone. She reports client has a history of diabetes, heart disease, myocardial infarction. She states client does not have a history of heart failure. She reports they are able to get client's medications at this time, but states that they get Jardiance samples from client's provider. She states client has been in the "donut hole" in the past. She reports client does not have a glucose meter, but will get one from primary care. Mrs. Ayre is receptive to receiving an advanced directive packet.  Goals Addressed            This Visit's Progress   . Client understands the importance of follow-up with providers by attending scheduled visits      . Client verbalizes knowledge of Heart Attack self management skills within the next 5-6 months.      . Client will report no worsening of symptoms related to heart disease within the next 5-6 months.      . Client will use Assistive Devices as needed and verbalize understanding of device use      . Client will verbalize knowledge of self management of Hypertension as evidences by BP reading of 140/90 or less; or as defined by provider      . HEMOGLOBIN A1C < 7.0       Diabetes self management actions:  Glucose monitoring per provider recommendations  Perform Quality checks on blood meter  Eat Healthy  Check  feet daily  Visit provider every 3-6 months as directed  Hbg A1C level every 3-6 months.  Eye Exam yearly    . Maintain timely refills of diabetic medication as prescribed within the year .       [employer] scientist (medical) will call you.    . Obtain annual  Lipid Profile, LDL-C      . Obtain Annual Eye (retinal)  Exam       . Obtain Annual Foot Exam      . Obtain annual screen for micro albuminuria (urine) , nephropathy (kidney problems)      . Obtain Hemoglobin A1C at least 2 times per year      . Visit Primary Care Provider or Endocrinologist at least 2 times per year         [facility] reinforced the importance of having a glucose meter and checking blood sugars regularly. RNCM reinforced specific glucose meters covered by the plan. Mrs. Mcclane states she is aware. Reinforced can contact [place] for benefits questions; encourage 24 hour nurse line as needed.  COVID-19 precautions discussed.  Client will be transitioned to Peter Garter, [facility] for Chronic Care Management Coordination as she is Mrs. [place] Chronic Care Management Coordinator and it will be easier for Mrs. Senner. Mrs. Was pleased with that idea and is in agreement.   Plan:  Send successful outreach letter with a copy of their individualized care plan, Send individual care plan to provider  and Social worker. Chronic care management coordination will outreach in 5-6 months. RNCM will refer client to:  Pharmacy   Thea Silversmith, RN, MSN, [place] [phone]

## 2019-02-02 ENCOUNTER — Telehealth: Payer: Self-pay | Admitting: Pharmacist

## 2019-02-02 NOTE — Patient Outreach (Signed)
[facility]) Care Management  02/02/2019  MATHEO RATHBONE [birth year] [id number]   Called patient's wife per due to him being hard of hearing. Purpose of the call was for a CSNP med review and med assistance evaluation. Before HIPAA identifiers could be obtained, Mrs. Oommen requested that I call her in the morning because she was not feeling well and was on her way to be tested for [place].  Plan: Call patient back in the morning as she requested.  Elayne Guerin, PharmD, [place] Clinical Pharmacist [phone]

## 2019-02-03 ENCOUNTER — Encounter (INDEPENDENT_AMBULATORY_CARE_PROVIDER_SITE_OTHER): Payer: Self-pay | Admitting: Ophthalmology

## 2019-02-03 ENCOUNTER — Other Ambulatory Visit: Payer: Self-pay | Admitting: Pharmacist

## 2019-02-03 ENCOUNTER — Ambulatory Visit: Payer: Self-pay | Admitting: Pharmacist

## 2019-02-03 NOTE — Patient Outreach (Addendum)
[facility]) Care Management  02/03/2019  Vincent [name] July 12, 1951 [id number]   Called patient's wife back. She answered the phone and said she had been diagnosed with pneumonia and that she was awaiting her COVID-19 test results.  She asked that I call her at a later time to discuss her husband's medications. Patient is hard of hearing and does not talk on the phone.  The purpose of my call was to complete a medication review and medication assistance evaluation for Jardiance.  Plan: Call back in 5-7 days. Alert CSNP Nurse, Thea Silversmith.  Elayne Guerin, PharmD, [place] Clinical Pharmacist [phone]

## 2019-02-10 ENCOUNTER — Other Ambulatory Visit: Payer: Self-pay | Admitting: Pharmacist

## 2019-02-10 NOTE — Patient Outreach (Signed)
[facility]) Care Management  02/10/2019  Vincent [name] 05/13/1951 [id number]   Patient was called regarding CSNP medication review and medication assistance evaluation.  Unfortunately, patient nor his wife answered the phone. HIPAA compliant voicemail was left. Today's call was the 2nd unsuccessful outreach to the patient.   Plan: Send patient an unsuccessful outreach letter. Call patient back in 1 month.   Elayne Guerin, PharmD, [place] Clinical Pharmacist [phone]

## 2019-02-12 ENCOUNTER — Ambulatory Visit: Payer: Self-pay | Admitting: Pharmacist

## 2019-02-26 DIAGNOSIS — W540XXA Bitten by dog, initial encounter: Secondary | ICD-10-CM | POA: Diagnosis not present

## 2019-02-26 DIAGNOSIS — S41101A Unspecified open wound of right upper arm, initial encounter: Secondary | ICD-10-CM | POA: Diagnosis not present

## 2019-02-26 DIAGNOSIS — L03113 Cellulitis of right upper limb: Secondary | ICD-10-CM | POA: Diagnosis not present

## 2019-02-27 DIAGNOSIS — S41141S Puncture wound with foreign body of right upper arm, sequela: Secondary | ICD-10-CM | POA: Diagnosis not present

## 2019-02-27 DIAGNOSIS — L03114 Cellulitis of left upper limb: Secondary | ICD-10-CM | POA: Diagnosis not present

## 2019-02-27 DIAGNOSIS — M79644 Pain in right finger(s): Secondary | ICD-10-CM | POA: Diagnosis not present

## 2019-02-27 DIAGNOSIS — W540XXS Bitten by dog, sequela: Secondary | ICD-10-CM | POA: Diagnosis not present

## 2019-03-03 DIAGNOSIS — I130 Hypertensive heart and chronic kidney disease with heart failure and stage 1 through stage 4 chronic kidney disease, or unspecified chronic kidney disease: Secondary | ICD-10-CM | POA: Diagnosis not present

## 2019-03-03 DIAGNOSIS — L03114 Cellulitis of left upper limb: Secondary | ICD-10-CM | POA: Diagnosis not present

## 2019-03-03 DIAGNOSIS — I5020 Unspecified systolic (congestive) heart failure: Secondary | ICD-10-CM | POA: Diagnosis not present

## 2019-03-03 DIAGNOSIS — N183 Chronic kidney disease, stage 3 (moderate): Secondary | ICD-10-CM | POA: Diagnosis not present

## 2019-03-03 DIAGNOSIS — M79645 Pain in left finger(s): Secondary | ICD-10-CM | POA: Diagnosis not present

## 2019-03-03 DIAGNOSIS — W540XXS Bitten by dog, sequela: Secondary | ICD-10-CM | POA: Diagnosis not present

## 2019-03-18 ENCOUNTER — Other Ambulatory Visit: Payer: Self-pay | Admitting: Pharmacist

## 2019-03-18 NOTE — Patient Outreach (Addendum)
[facility]) Care Management  [place]   03/18/2019  Vincent [name] 1950-10-17 [id number]  Reason for referral: Medication Assistance, Medication Review  Referral source: [employer] Care Manager with [facility] Current insurance: [employer] C-SNP  PMHx includes but not limited to:   CAD, hyperlipidemia, OSA, and Edema.  Outreach:  Successful telephone call with patient's wife Vincent [name]).  HIPAA identifiers verified.   Subjective:  Patient's wife assists him with his medications and fills a pill box for him.    Does the patient ever forget to take medication?  yes Does the patient have problems obtaining medications due to transportation?   no Does the patient have problems obtaining medications due to cost?  yes  Does the patient feel that medications prescribed are effective?  yes Does the patient ever experience any side effects to the medications prescribed?  no  Does the patient measure his/her own blood glucose at home?  No Does the patient measure his/her own blood pressure at home? No   Objective: The ASCVD Risk score Vincent Bussing DC Jr., et al., 2013) failed to calculate for the following reasons:   The valid total cholesterol range is 130 to 320 mg/dL  Lab Results  Component Value Date   CREATININE 0.90 08/10/2016   CREATININE 1.03 08/02/2016   CREATININE 0.90 05/12/2016    Lab Results  Component Value Date   HGBA1C 6.1 (H) 08/02/2016    Lipid Panel     Component Value Date/Time   CHOL 115 ([name]) 07/02/2016 0735   TRIG 68 07/02/2016 0735   HDL 47 07/02/2016 0735   CHOLHDL 2.4 07/02/2016 0735   VLDL 14 07/02/2016 0735   LDLCALC 54 07/02/2016 0735    BP Readings from Last 3 Encounters:  11/21/18 110/72  11/12/18 128/62  12/26/17 (!) 95/58    Allergies  Allergen Reactions  . Penicillins Other (See Comments)    Has patient had a PCN reaction causing immediate rash, facial/tongue/throat swelling, SOB or  lightheadedness with hypotension: Unknown Has patient had a PCN reaction causing severe rash involving mucus membranes or skin necrosis: Unknown Has patient had a PCN reaction that required hospitalization: Unknown Has patient had a PCN reaction occurring within the last 10 years: No If all of the above answers are "NO", then may proceed with Cephalosporin use.     Medications Reviewed Today    Reviewed by Vincent [name], [facility] (Pharmacist) on 03/18/19 at 1113  Med List Status: <None>  Medication Order Taking? Sig Documenting Provider Last Dose Status Informant  0.9 %  sodium chloride infusion [id number]   Vincent [name]  Active   aspirin EC 81 MG tablet [id number] Yes Take 1 tablet (81 mg total) by mouth daily. Vincent Pandy, [name] Taking Active Spouse/Significant Other           Med Note Vincent [name], Vincent Ridges   Fri Nov 21, 2018  5:52 AM) 400am   cyclobenzaprine (FLEXERIL) 10 MG tablet [id number] Yes Take 10 mg by mouth every 8 (eight) hours as needed for muscle spasms. Provider, Historical, [name] Taking Active Spouse/Significant Other  furosemide (LASIX) 40 MG tablet [id number] Yes TAKE 1 TABLET BY MOUTH TWICE A DAY Vincent [name] Taking Active   HYDROcodone-acetaminophen (NORCO/VICODIN) 5-325 MG tablet [id number] Yes Take 1 tablet by mouth every 8 (eight) hours as needed for moderate pain.  Provider, Historical, [name] Taking Active Spouse/Significant Other  isosorbide mononitrate (IMDUR) 30 MG 24 hr tablet [id number] Yes  TAKE 1 TABLET BY MOUTH EVERY DAY Vincent Pandy, [name] Taking Active   JARDIANCE 25 MG TABS tablet [id number] Yes Take 25 mg by mouth daily. Provider, Historical, [name] Taking Active Spouse/Significant Other  LORazepam (ATIVAN) 1 MG tablet [id number] Yes Take 1 mg by mouth every 8 (eight) hours as needed for anxiety.  Provider, Historical, [name] Taking Active Spouse/Significant Other           Med Note Vincent [name], Vincent [name]   Wed Nov 12, 2018 11:56 AM)    meloxicam (MOBIC)  7.5 MG tablet [id number] Yes Take 1 tablet (7.5 mg total) by mouth daily.  Patient taking differently:  Take 7.5 mg by mouth 2 (two) times daily as needed for pain.    Palumbo, April, [name] Taking Active Spouse/Significant Other  metFORMIN (GLUCOPHAGE) 1000 MG tablet [id number] Yes Take 1,000 mg by mouth 2 (two) times daily. Provider, Historical, [name] Taking Active Spouse/Significant Other  metoprolol tartrate (LOPRESSOR) 50 MG tablet [id number] Yes TAKE 1 TABLET BY MOUTH TWICE A DAY Vincent [name] Taking Active   nitroGLYCERIN (NITROSTAT) 0.4 MG SL tablet [id number] Yes Place 1 tablet (0.4 mg total) under the tongue every 5 (five) minutes as needed for chest pain. Vincent [name] Taking Active Spouse/Significant Other  ramipril (ALTACE) 2.5 MG capsule [id number]  TAKE 1 CAPSULE (2.5 MG TOTAL) BY MOUTH DAILY. Vincent [name]  Expired 02/15/19 2359   sertraline (ZOLOFT) 50 MG tablet [id number] Yes Take 50 mg by mouth daily. Provider, Historical, [name] Taking Active   simvastatin (ZOCOR) 40 MG tablet [id number] Yes Take 1 tablet (40 mg total) by mouth at bedtime. Vincent [name] Taking Active Spouse/Significant Other  traMADol (ULTRAM) 50 MG tablet [id number] Yes Take 50 mg by mouth every 8 (eight) hours as needed for moderate pain.  Provider, Historical, [name] Taking Active Spouse/Significant Other           Med Note Vincent [name], Vincent Ridges   Fri Nov 21, 2018  5:55 AM) 400am          Assessment: Drugs sorted by system:  Neurologic/Psychologic: Lorazepam, Sertraline,   Cardiovascular:  Aspirin, Furosemide, Isosorbide Mononitrate, Metoprolol tartrate, Nitroglycerin, Simvastatin, Ramipril  Endocrine: Jardiance, Metformin,   Pain: Hydrocodone/APAP, Meloxicam, Cyclobenzaprine, Tramadol  Medication Review Findings:     Medication Adherence Findings: Adherence Review  '[]'  Excellent (no doses missed/week)     '[]'  Good (no more than 1 dose missed/week)     '[x]'  Partial  (2-3 doses missed/week) '[]'  Poor (>3 doses missed/week)  Patient with good understanding of regimen and good understanding of indications.    Medication Assistance Findings:  Medication assistance needs identified: Jardiance  Extra Help:  Not eligible for Extra Help Low Income Subsidy based on reported income and assets  Patient Assistance Programs: Jardiance made by  o Income requirement met: Yes o Out-of-pocket prescription expenditure met:   Not Applicable Patient has met application requirements to apply for this patient assistance program.      From HTA: TROOP= $150.76 (True out of pocket)  TDS= $2,117.39 (Total Drug Spend)  Plan: . I will route patient assistance letter to [place] technician who will coordinate patient assistance program application process for medications listed above.  [facility] pharmacy technician will assist with obtaining all required documents from both patient and provider(s) and submit application(s) once completed.  . Will route note to PCP.   Vincent [name], PharmD, [place] Clinical Pharmacist [phone]

## 2019-03-19 ENCOUNTER — Other Ambulatory Visit: Payer: Self-pay | Admitting: Pharmacy Technician

## 2019-03-19 NOTE — Patient Outreach (Signed)
[facility]) Care Management  03/19/2019  JOANDY BURGET 11-Jun-1951 [id number]                           Medication Assistance Referral  Referral From: [place]  Medication/Company: [employer] / BI Patient application portion:  Mailed Provider application portion: Faxed  to Dr. Marton Redwood    Follow up:  Will follow up with patient in 5-10 business days to confirm application(s) have been received.  Plummer Matich P. Shanaye Rief, [place] Management [phone]

## 2019-04-01 ENCOUNTER — Other Ambulatory Visit: Payer: Self-pay | Admitting: Pharmacy Technician

## 2019-04-01 NOTE — Patient Outreach (Signed)
[place] [facility]) Care Management  04/01/2019  Vincent [name] 02-15-1951 [id number]    Unsuccessful outreach call placed to patient in regards to [facility] [place] application for Jardiance.  Unfortunately patient did not answer the phone, HIPAA compliant voice mail left.  Was calling patient to inquire if he had received the patient assistance application that was mailed to the patient on 03/19/2019.  Will followup with 2nd outreach attempt in 3-7 business days.  Sariyah Corcino P. [name], [place] Management [phone]

## 2019-04-06 ENCOUNTER — Other Ambulatory Visit: Payer: Self-pay | Admitting: Pharmacy Technician

## 2019-04-06 NOTE — Patient Outreach (Signed)
[facility]) Care Management  04/06/2019  Vincent [name] 07-16-1951 [id number]  Successful outreach call placed to patient in regards to [facility] application for Jardiance.  Spoke to patient's wife Vincent [name], [place] identifiers verified.  Patient's wife informed they have not received the application in the mail and they check the mail daily. Informed her I would place another application in the mail and that it would come in with HTA on the outside of the envelope. She verbalized understanding.  Will followup with patient in 5-7 business days to inquire if they have received the 2nd application.  Semisi Biela P. Tashawnda Bleiler, [place] Management [phone]

## 2019-04-13 ENCOUNTER — Other Ambulatory Visit: Payer: Self-pay | Admitting: Pharmacy Technician

## 2019-04-13 NOTE — Patient Outreach (Signed)
[facility]) Care Management  04/13/2019  Vincent [name] 1951-01-09 [id number]   Successful outreach call placed to patient in regards to [facility] application for Jardiance.  Spoke to patient's wife who said this was not a good time and requested a call back at a later date/time.  Will followup with patient in 3-7 business days to inquire if 2nd mailing of application was received.  [name] P. Kennetta Pavlovic, [place] Management [phone]

## 2019-04-22 ENCOUNTER — Other Ambulatory Visit: Payer: Self-pay | Admitting: Pharmacy Technician

## 2019-04-22 NOTE — Patient Outreach (Signed)
[facility]) Care Management  04/22/2019  LYNKIN SAINI [birth year] [id number]   Unsuccessful outreach call placed to patient in regards to [facility] application for Jardiance.  This was my 4th call attempt.  Unfortunately patient did not answer the phone. Was calling to inquire if patient had received any of the applications that we have mailed them. We mailed the 1st application on 7/00 and remailed the application on 1/74/9449.  Will route note to [place] that medication assistance case is being closed due to lack of patient engagement  with the option to reopen if the applications are mailed back in. Will remove myself from care team.  Luiz Ochoa. Jeanmarie Mccowen, [place] Management [phone]

## 2019-04-28 ENCOUNTER — Other Ambulatory Visit: Payer: Self-pay | Admitting: Pharmacist

## 2019-04-28 ENCOUNTER — Ambulatory Visit: Payer: Self-pay | Admitting: Pharmacist

## 2019-04-28 NOTE — Patient Outreach (Signed)
[facility]) Care Management  04/28/2019  JOBANNY MAVIS 12-08-1950 [id number]   Patient was called for CNSP follow up and to follow up on medication assistance. Unfortunately, patient did not answer the phone. HIPAA compliant message was left on the voicemail of both the numbers listed in his chart.  Plan: Call patient back in 1 month.   Elayne Guerin, PharmD, [place] Clinical Pharmacist [phone]

## 2019-05-27 DIAGNOSIS — N183 Chronic kidney disease, stage 3 (moderate): Secondary | ICD-10-CM | POA: Diagnosis not present

## 2019-05-27 DIAGNOSIS — E785 Hyperlipidemia, unspecified: Secondary | ICD-10-CM | POA: Diagnosis not present

## 2019-05-27 DIAGNOSIS — I130 Hypertensive heart and chronic kidney disease with heart failure and stage 1 through stage 4 chronic kidney disease, or unspecified chronic kidney disease: Secondary | ICD-10-CM | POA: Diagnosis not present

## 2019-05-27 DIAGNOSIS — F39 Unspecified mood [affective] disorder: Secondary | ICD-10-CM | POA: Diagnosis not present

## 2019-05-27 DIAGNOSIS — I209 Angina pectoris, unspecified: Secondary | ICD-10-CM | POA: Diagnosis not present

## 2019-05-27 DIAGNOSIS — I10 Essential (primary) hypertension: Secondary | ICD-10-CM | POA: Diagnosis not present

## 2019-05-27 DIAGNOSIS — I5020 Unspecified systolic (congestive) heart failure: Secondary | ICD-10-CM | POA: Diagnosis not present

## 2019-05-27 DIAGNOSIS — E1129 Type 2 diabetes mellitus with other diabetic kidney complication: Secondary | ICD-10-CM | POA: Diagnosis not present

## 2019-05-27 DIAGNOSIS — Z9861 Coronary angioplasty status: Secondary | ICD-10-CM | POA: Diagnosis not present

## 2019-06-03 ENCOUNTER — Other Ambulatory Visit: Payer: Self-pay | Admitting: Pharmacist

## 2019-06-03 ENCOUNTER — Ambulatory Visit: Payer: Self-pay | Admitting: Pharmacist

## 2019-06-03 NOTE — Patient Outreach (Signed)
[facility]) Care Management  06/03/2019  Vincent [name] 28-Aug-1951 [id number]    Patient was called for CNSP follow up and to follow up on medication assistance. Unfortunately, patient did not answer the phone. HIPAA compliant message was left on the voicemail of both the numbers listed in his chart.  Today's call was the second unsuccessful phone call.   Plan: Call patient back in 3 months for [place] Follow up.   Elayne Guerin, PharmD, [place] Clinical Pharmacist [phone]

## 2019-06-23 ENCOUNTER — Other Ambulatory Visit: Payer: Self-pay

## 2019-06-23 NOTE — Patient Outreach (Signed)
  [facility]) Care Management Chronic Special Needs Program  06/23/2019  Name: Vincent [name] DOB: September 01, 1951  MRN: [id number]  Vincent [name] is enrolled in a chronic special needs plan for Diabetes. Reviewed and updated care plan. Spoke with wife Daid Tresch Designated Party Release due to impaired hearing  HIPAA verified  Subjective: States that client has been doing good States that he is due to have his labs checked soon.  Denies any problems with medications and states she helps him with his medications.  Client does not check blood sugars.  States he sometimes eats junk food but usually eats the same diabetic diet that she follows.  Goals Addressed            This Visit's Progress   . Client understands the importance of follow-up with providers by attending scheduled visits   On track   . Client verbalizes knowledge of Heart Attack self management skills within the next 5-6 months.(continue 06/23/19)   On track   . Client will report no worsening of symptoms related to heart disease within the next 5-6 months.(continue 06/23/19)   On track   . Client will use Assistive Devices as needed and verbalize understanding of device use   On track   . Client will verbalize knowledge of self management of Hypertension as evidences by BP reading of 140/90 or less; or as defined by provider   On track   . HEMOGLOBIN A1C < 7.0       Diabetes self management actions:  Glucose monitoring per provider recommendations  Eat Healthy  Check feet daily  Visit provider every 3-6 months as directed  Hbg A1C level every 3-6 months.  Eye Exam yearly    . Maintain timely refills of diabetic medication as prescribed within the year .   On track    [employer] will call you.    . COMPLETED: Obtain annual  Lipid Profile, LDL-C       Completed 11/19/18    . Obtain Annual Eye (retinal)  Exam    On track   . COMPLETED: Obtain Annual Foot Exam      Completed 11/19/18    . COMPLETED: Obtain annual screen for micro albuminuria (urine) , nephropathy (kidney problems)       Completed 11/19/18    . Obtain Hemoglobin A1C at least 2 times per year   On track   . Visit Primary Care Provider or Endocrinologist at least 2 times per year    On track    Client is  meeting diabetes self management goal of hemoglobin A1C of <7% with last reading of 6.4% Reinforced to follow a low CHO diet and to limit junk foods. Encouraged to call to schedule annual dilated eye exam.   Reviewed number for 24-hour nurse Line Discussed COVID19 cause, symptoms, precautions (social distancing, stay at home order, hand washing), confirmed client knows how to contact provider  Plan:  Send successful outreach letter with a copy of their individualized care plan and Send individual care plan to provider  Chronic care management coordinator will outreach in:  6 Months    Peter Garter RN, [facility], [place] Management Coordinator [place] Management [phone]

## 2019-07-23 DIAGNOSIS — E1129 Type 2 diabetes mellitus with other diabetic kidney complication: Secondary | ICD-10-CM | POA: Diagnosis not present

## 2019-07-23 DIAGNOSIS — Z23 Encounter for immunization: Secondary | ICD-10-CM | POA: Diagnosis not present

## 2019-08-25 ENCOUNTER — Other Ambulatory Visit: Payer: Self-pay | Admitting: Pharmacy Technician

## 2019-08-25 ENCOUNTER — Other Ambulatory Visit: Payer: Self-pay | Admitting: Pharmacist

## 2019-08-25 NOTE — Patient Outreach (Signed)
[facility]) Care Management  08/25/2019  CONER KALLMEYER 1951/03/16 [id number]                                        Medication Assistance Referral  Referral From: [place]  Medication/Company: [employer] / BI Patient application portion:  Mailed Provider application portion: Faxed  to Dr Marton Redwood Provider address/fax verified via: Office website  This will be the 3rd application mailed to patient.  Follow up:  Will follow up with patient in 5-10 business days to confirm application(s) have been received.  Romelia Bromell P. Doss Cybulski, [place] Management [phone]

## 2019-08-25 NOTE — Patient Outreach (Signed)
[facility]) Care Management  [place]   08/25/2019  Vincent [name] [birth year] [id number]  Reason for referral: medication assistance  Referral source: HTA  Referral medication(s): Jardiance Current insurance: HTA CSNP  HPI:  Patient is a 68 year old male with multiple medical conditions including but not limited to:  CAD, hypertension, hyperlipidemia, OSA, SOB, type 2 diabetes, and tachycardia.  Spoke with patient's wife who identified HIPAA x2. Patient's wife is in charge of the patient's medications. She expressed an issue with Jardiance affordability now that the patient is in the donut hole.  Objective: Allergies  Allergen Reactions  . Penicillins Other (See Comments)    Has patient had a PCN reaction causing immediate rash, facial/tongue/throat swelling, SOB or lightheadedness with hypotension: Unknown Has patient had a PCN reaction causing severe rash involving mucus membranes or skin necrosis: Unknown Has patient had a PCN reaction that required hospitalization: Unknown Has patient had a PCN reaction occurring within the last 10 years: No If all of the above answers are "NO", then may proceed with Cephalosporin use.     Medications Reviewed Today    Reviewed by Elayne Guerin, [facility] (Pharmacist) on 08/25/19 at [phone]  Med List Status: <None>  Medication Order Taking? Sig Documenting Provider Last Dose Status Informant  0.9 %  sodium chloride infusion [id number]   Ladene Artist, MD  Active   aspirin EC 81 MG tablet [id number] Yes Take 1 tablet (81 mg total) by mouth daily. Consuelo Pandy, PA-C Taking Active Spouse/Significant Other           Med Note Deon Pilling, Clent Ridges   Fri Nov 21, 2018  5:52 AM) 400am   cyclobenzaprine (FLEXERIL) 10 MG tablet [id number] Yes Take 10 mg by mouth every 8 (eight) hours as needed for muscle spasms. [provider] Taking Active Spouse/Significant Other  furosemide (LASIX) 40 MG tablet [id number] Yes TAKE  1 TABLET BY MOUTH TWICE A DAY Lyda Jester M, PA-C Taking Active   HYDROcodone-acetaminophen (NORCO/VICODIN) 5-325 MG tablet [id number] Yes Take 1 tablet by mouth every 8 (eight) hours as needed for moderate pain.  [provider] Taking Active Spouse/Significant Other  isosorbide mononitrate (IMDUR) 30 MG 24 hr tablet [id number] Yes TAKE 1 TABLET BY MOUTH EVERY DAY Consuelo Pandy, PA-C Taking Active   JARDIANCE 25 MG TABS tablet [id number] Yes Take 25 mg by mouth daily. [provider] Taking Active Spouse/Significant Other  LORazepam (ATIVAN) 1 MG tablet [id number] Yes Take 1 mg by mouth every 8 (eight) hours as needed for anxiety.  [provider] Taking Active Spouse/Significant Other           Med Note Lia Hopping, MINDY L   Wed Nov 12, 2018 11:56 AM)    meloxicam (MOBIC) 7.5 MG tablet [id number] Yes Take 1 tablet (7.5 mg total) by mouth daily.  Patient taking differently: Take 7.5 mg by mouth 2 (two) times daily as needed for pain.    Palumbo, April, MD Taking Active Spouse/Significant Other  metFORMIN (GLUCOPHAGE) 1000 MG tablet [id number] Yes Take 1,000 mg by mouth 2 (two) times daily. [provider] Taking Active Spouse/Significant Other  metoprolol tartrate (LOPRESSOR) 50 MG tablet [id number] Yes TAKE 1 TABLET BY MOUTH TWICE A DAY Lyda Jester M, PA-C Taking Active   nitroGLYCERIN (NITROSTAT) 0.4 MG SL tablet [id number] Yes Place 1 tablet (0.4 mg total) under the tongue every 5 (five) minutes as needed for chest pain. Consuelo Pandy, PA-C Taking Active Spouse/Significant  Other  ramipril (ALTACE) 2.5 MG capsule [id number]  TAKE 1 CAPSULE (2.5 MG TOTAL) BY MOUTH DAILY. Lyda Jester M, PA-C  Expired 02/15/19 2359   sertraline (ZOLOFT) 50 MG tablet [id number] Yes Take 50 mg by mouth daily. [provider] Taking Active   simvastatin (ZOCOR) 40 MG tablet [id number] Yes Take 1 tablet (40 mg total) by mouth at bedtime. Lyda Jester M,  PA-C Taking Active Spouse/Significant Other  traMADol (ULTRAM) 50 MG tablet [id number] Yes Take 50 mg by mouth every 8 (eight) hours as needed for moderate pain.  [provider] Taking Active Spouse/Significant Other           Med Note Deon Pilling, Clent Ridges   Fri Nov 21, 2018  5:55 AM) 400am          Assessment:  Medication Review Findings:  . Increased risk of gastric bleeding with combination of meloxicam and sertraline (patient's wife reported patient takes meloxicam PRN)  . Increased risk of CNS depression with combination of: Lorazepam/cyclobenzaprine/Tramadol/Hydrocodone/APAP  . Increased risk of renal damage with combination of ramipril, Furosemide, and meloxicam  HgA1c- 6.4% 07/23/2019 On statin- simvastatin  Medication Assistance Findings:  Medication assistance needs identified: Jardiance  Patient is over income for LIS/Extra Help He appears to qualify for [employer]'s program for [employer]. Reviewed necessary financial documentation with the patient's wife.  They did not file taxes for 2019, she will send copies of both of their Social Security Benefits Letters.   Additional medication assistance options reviewed with patient as warranted:  No other options identified  Plan: I will route patient assistance letter to [place] technician who will coordinate patient assistance program application process for medications listed above.  [facility] pharmacy technician will assist with obtaining all required documents from both patient and provider(s) and submit application(s) once completed.    Follow up on med assistance in 3-4 weeks.  Elayne Guerin, PharmD, [place] Clinical Pharmacist [phone]

## 2019-09-09 ENCOUNTER — Other Ambulatory Visit: Payer: Self-pay | Admitting: Pharmacy Technician

## 2019-09-09 NOTE — Patient Outreach (Addendum)
Vincent [name] [facility]) Care Management  09/09/2019  Vincent [name] 01/25/1951 [id number]     Successful call placed to patient regarding patient assistance application(s) for Jardiance with BI , HIPAA identifiers verified.   Spoke to [name] who informed she does not think she has received the application as she has not received any mail from [facility].   Informed patient the application would have come in an envelope that says [employer] on the outside. Patient's wife informed they may have received it then.  Informed patient to call me back today if she has not received the patient's application as I could mail him another one when I am in the office tomorrow. Informed patient's wife that if I did not hear back then I would assume they received the application. Patient's wife verbalized understanding. Confirmed patient had name and number.  Follow up:  Will route note to [place] for case clsoure if document(s) have not been received in the next 20 business days/after the holiday season.  Bartholomew Ramesh P. Naila Elizondo, [place] Management [phone]

## 2019-09-21 ENCOUNTER — Ambulatory Visit: Payer: Self-pay | Admitting: Pharmacist

## 2019-09-29 ENCOUNTER — Other Ambulatory Visit: Payer: Self-pay | Admitting: Pharmacist

## 2019-09-29 NOTE — Patient Outreach (Signed)
[facility]) Care Management  09/29/2019  Vincent [name] Mar 31, 1951 [id number]  Patient was called to follow up on medication assistance. Spoke with his wife [name]). HIPAA identifiers were obtained.    Ms. [place]ket[place] said they did not receive the Jardiance application that was sent to them.  Sharee Pimple Simcox documented on 09/09/2019 that she spoke with Ms. Frattini who was unsure if she had received the application but said she would call back if she did not.)  Plan: Route note to [place], Sharee Pimple Simcox to send the patient another application.  Elayne Guerin, PharmD, [place] Clinical Pharmacist [phone]

## 2019-10-07 ENCOUNTER — Other Ambulatory Visit: Payer: Self-pay | Admitting: Pharmacy Technician

## 2019-10-07 NOTE — Patient Outreach (Signed)
[facility] [facility]) Care Management  10/07/2019  Vincent [name] [birth year] [id number]    Received incoming in basket from [place] that patient was unable to locate the applications.  Prepared 3rd BI application for Jardiance to be mailed to patient today.  Will followup with patient in 5-7 business days to inquire if applications were received.  Enrico Eaddy P. [name], [place] Management [phone]

## 2019-10-16 ENCOUNTER — Other Ambulatory Visit: Payer: Self-pay | Admitting: Pharmacy Technician

## 2019-10-16 NOTE — Patient Outreach (Signed)
[facility]) Care Management  10/16/2019  Vincent [name] [birth year] [id number]   Unsuccessful call placed to patient regarding patient assistance application(s) for Jardiance with BI , HIPAA compliant voicemail left.   Was calling patient to inquire if he received the 3rd mailing of the BI application. Unfortunately he did not answer the phone. HIPAA compliant voicemails left on home and mobile number.  Follow up:  Will follow up with 2nd outreach call concerning 3rd applicaiton mailing in 3-7 business days if call is not returned.  [name] P. Alizey Noren, [place] Management [phone]

## 2019-10-23 ENCOUNTER — Other Ambulatory Visit: Payer: Self-pay | Admitting: Pharmacy Technician

## 2019-10-23 NOTE — Patient Outreach (Signed)
[facility] [facility]) Care Management  10/23/2019  GAURAV ROHLMAN [birth year] [id number]   Successful call placed to patient regarding patient assistance application(s) for Jardiance with BI , HIPAA identifiers verified. This was the 3rd application that was mailed to the patient.  Spoke to [name] who verified and confirmed HIPAA identifiers. [name] informed she believes they have received the application because "she has a thick envelope that she has not looked at yet". She informed that she would call me back if it is not the application. Confirmed patient had name and number. Patient informed she had "the card" and as long as the number is on the card then she had the number. Also informed her that in the letter from [place], her number was also listed and therefore she could call either me or the pharmacist. Patient verbalized understanding.  Follow up:  Will route note to [place] for case closure if document(s) have not been received in the next 15 business days.  Yaslene Lindamood P. Konnar Ben, [place] Management [phone]

## 2019-11-16 ENCOUNTER — Other Ambulatory Visit: Payer: Self-pay

## 2019-11-16 NOTE — Patient Outreach (Signed)
[facility]) Care Management Chronic Special Needs Program  11/16/2019  Name: Vincent [name] DOB: 07-28-1951  MRN: [id number]  [name] is enrolled in a chronic special needs plan for Diabetes. Chronic Care Management Coordinator telephoned client to review health risk assessment and to develop individualized care plan.  Introduced the chronic care management program, importance of client participation, and taking their care plan to all provider appointments and inpatient facilities.  Reviewed the transition of care process and possible referral to community care management. Spoke with wife Vincent [name] Designated Party Release and clients request due to impaired hearing HIPAA verified  Subjective:States that client has been doing good.  States he is to go next to have his lab drawn and will see his doctor in a week or so.  States he does not check his blood sugars since his Hemoglobin A1C is so good.  States that he eats the same diabetic diet that she follows.  States he does like to snack on junk foods chips and cookies sometimes.  States he stopped taking the Jardiance due to cost.  States he did not receive the assistance forms from the [employer]. Denies any chest pains or heart issues.  States his B/P is good when checked at his doctors visits. Declines Advanced Directives information or forms  Goals Addressed            This Visit's Progress   . Client understands the importance of follow-up with providers by attending scheduled visits   On track    Reinforced to keep scheduled appointments with providers    . Client verbalizes knowledge of Heart Attack self management skills within the next 6 months.(continue 11/16/19)   On track    Reports no issues with heart disease Reviewed signs and symptoms to call doctor and when to call 911 Follow a low salt diet     . Client will report abillity to obtain Medications within next 6 months        Triad Engineer, site notified to resend assistance forms for Jardiance to client Please complete assistance forms and return when completed    . COMPLETED: Client will report no worsening of symptoms related to heart disease within the next 5-6 months.(continue 06/23/19)   On track    No reports of heart disease issues and verbalizes when to call MD Completed 11/16/19    . Client will verbalize knowledge of self management of Hypertension as evidences by BP reading of 140/90 or less; or as defined by provider   On track    Reports B/P below 140/90 when checked Reviewed: lifestyle modification - low sodium diet, weight control. medication compliance, exercise and stress Sent EMMI-High Blood Pressure in Adults     . Diabetes Patient stated goal to keep hemogloblin A1C below than 7% for the next 6 months (pt-stated)   On track    Reviewed following low carbohydrate low sodium diet and to avoid snacks high in carbohydrates     . HEMOGLOBIN A1C < 7 (pt-stated)       Last Hemoglobin A1C 6.4% on 07/22/20 Reinforced to avoid snacking on high carbohydrate foods such as chips, cookies and cake Reviewed healthy snacks    . Maintain timely refills of diabetic medication as prescribed within the year .   On track    Triad Research scientist (medical) will continue to follow you Reinforced importance of getting medications refilled on time    . Obtain annual  Lipid Profile, LDL-C   On track    Completed 11/19/18 LDL 59 The goal for LDL is less than 70 mg/dL as you are at high risk for complications     . Obtain Annual Eye (retinal)  Exam    Not on track    Plan to have a dilated eye exam every year    . Obtain Annual Foot Exam   On track    Completed 11/19/18 Check your skin and feet every day for cuts, bruises, redness, blisters, or sores. Schedule a foot exam with your health care provider once every year    . Obtain annual screen for micro albuminuria (urine) , nephropathy  (kidney problems)   On track    Completed 11/19/18 It is important for your doctor to check your urine for protein at least every year    . Obtain Hemoglobin A1C at least 2 times per year   On track    Completed 11/12/18, 07/23/19 It is important to have your Hemoglobin A1C checked every 6 months if you are at goal and every 3 months if you are not at goal    . Visit Primary Care Provider or Endocrinologist at least 2 times per year    On track    Primary care provider 11/19/18, 05/27/19, 07/23/19 Please schedule your annual wellness visit     Client is meeting diabetes self management goal of hemoglobin A1C of <7% with last reading of 6.4% Client is past due for dilated eye exam Client is currently active with [place] for medication assistance and will send message to Denyse Amass, RHP and Sharee Pimple Simcox to resend assistance forms to client  Plan:  Send successful outreach letter with a copy of their individualized care plan, Send individual care plan to provider and Send educational material  Chronic care management coordination will outreach in:  6 Months  Will refer client to:  Pharmacy currently active   Peter Garter RN, Jackquline Denmark, [place] Management [phone]

## 2019-11-17 ENCOUNTER — Other Ambulatory Visit: Payer: Self-pay | Admitting: Cardiology

## 2019-11-17 ENCOUNTER — Other Ambulatory Visit: Payer: Self-pay | Admitting: Pharmacy Technician

## 2019-11-17 NOTE — Patient Outreach (Signed)
[place]) Care Management  11/17/2019  Vincent [name] May 07, 1951 [id number]    Received in basket message from [place] that patient's wife was unable to locate any of the prior 3 applications for Jardiance with BI  that were mailed  to the patient on 03/19/2019, 08/25/2019 and 10/07/2019.  Prepared another application to be mailed to patient today for Jardiance with BI.  Will follow up with patient in 10-15 business days to inquire if application was received.  [name] [name], [place] Management [phone]

## 2019-11-18 DIAGNOSIS — Z125 Encounter for screening for malignant neoplasm of prostate: Secondary | ICD-10-CM | POA: Diagnosis not present

## 2019-11-18 DIAGNOSIS — E7849 Other hyperlipidemia: Secondary | ICD-10-CM | POA: Diagnosis not present

## 2019-11-18 DIAGNOSIS — E1129 Type 2 diabetes mellitus with other diabetic kidney complication: Secondary | ICD-10-CM | POA: Diagnosis not present

## 2019-11-20 DIAGNOSIS — I10 Essential (primary) hypertension: Secondary | ICD-10-CM | POA: Diagnosis not present

## 2019-11-20 DIAGNOSIS — R82998 Other abnormal findings in urine: Secondary | ICD-10-CM | POA: Diagnosis not present

## 2019-11-24 DIAGNOSIS — I209 Angina pectoris, unspecified: Secondary | ICD-10-CM | POA: Diagnosis not present

## 2019-11-24 DIAGNOSIS — Z1212 Encounter for screening for malignant neoplasm of rectum: Secondary | ICD-10-CM | POA: Diagnosis not present

## 2019-11-24 DIAGNOSIS — R296 Repeated falls: Secondary | ICD-10-CM | POA: Diagnosis not present

## 2019-11-24 DIAGNOSIS — Z9861 Coronary angioplasty status: Secondary | ICD-10-CM | POA: Diagnosis not present

## 2019-11-24 DIAGNOSIS — H8110 Benign paroxysmal vertigo, unspecified ear: Secondary | ICD-10-CM | POA: Diagnosis not present

## 2019-12-12 ENCOUNTER — Other Ambulatory Visit: Payer: Self-pay | Admitting: Cardiology

## 2019-12-13 ENCOUNTER — Other Ambulatory Visit: Payer: Self-pay | Admitting: Cardiology

## 2019-12-21 NOTE — Progress Notes (Deleted)
Cardiology Office Note    Date:  12/21/2019   ID:  Vincent [name], DOB 1950-11-03, MRN [id number]  PCP:  Marton Redwood, MD  Cardiologist: Lauree Chandler, MD EPS: None  No chief complaint on file.   History of Present Illness:  Vincent [name] is a 69 y.o. male with history of CAD status post anterior MI in 2007 treated with stent to the LAD, cath in 2011 30 to 40% proximal LAD restenosis, 40% mid circumflex, normal RCA LVEF 45 to 50%.  Normal stress Myoview 2015.  Recurrent chest pain 11/2018 and underwent cardiac catheterization 11/21/2018 patent stent to the proximal LAD with mild stent restenosis 40%, LVEF 40 to 50%, unchanged from 2011 also has hypertension and HLD.    Past Medical History:  Diagnosis Date  . Acute MI ([place])   . Allergy   . Anxiety   . Arthritis   . Coronary artery disease   . Depression   . Diabetes mellitus without complication ([place])   . Diverticulosis    mild  . Hemorrhoids   . HOH (hard of hearing)   . Hx of colonic polyps   . Hyperlipidemia   . Hypertension   . Left ventricular dysfunction    hx of    Past Surgical History:  Procedure Laterality Date  . ANGIOPLASTY     and bare metal stent placement  . ANKLE SURGERY Right 1986   i&d abscess- pt states "put a bone in it"  . CORONARY ANGIOPLASTY WITH STENT PLACEMENT    . KNEE ARTHROPLASTY Right 08/09/2016   Procedure: RIGHT TOTAL KNEE ARTHROPLASTY WITH COMPUTER NAVIGATION;  Surgeon: Rod Can, MD;  Location: [facility];  Service: Orthopedics;  Laterality: Right;  Needs RNFA  . LEFT HEART CATH AND CORONARY ANGIOGRAPHY N/A 11/21/2018   Procedure: LEFT HEART CATH AND CORONARY ANGIOGRAPHY;  Surgeon: Burnell Blanks, MD;  Location: [facility];  Service: Cardiovascular;  Laterality: N/A;    Current Medications: No outpatient medications have been marked as taking for the 12/22/19 encounter (Appointment) with Imogene Burn, PA-C.   Current Facility-Administered Medications  for the 12/22/19 encounter (Appointment) with Imogene Burn, PA-C  Medication  . 0.9 %  sodium chloride infusion     Allergies:   Penicillins   Social History   Socioeconomic History  . Marital status: Married    Spouse name: Not on file  . Number of children: Not on file  . Years of education: Not on file  . Highest education level: Not on file  Occupational History  . Occupation: full time    Employer: [employer]  Tobacco Use  . Smoking status: Former Smoker    Types: Cigars    Quit date: 12/17/2005    Years since quitting: 14.0  . Smokeless tobacco: Former Systems developer    Quit date: 12/17/2005  Substance and Sexual Activity  . Alcohol use: No  . Drug use: No  . Sexual activity: Not on file  Other Topics Concern  . Not on file  Social History Narrative  . Not on file   Social Determinants of Health   Financial Resource Strain:   . Difficulty of Paying Living Expenses:   Food Insecurity: No Food Insecurity  . Worried About Charity fundraiser in the Last Year: Never true  . Ran Out of Food in the Last Year: Never true  Transportation Needs: No Transportation Needs  . Lack of Transportation (Medical): No  . Lack of Transportation (  Non-Medical): No  Physical Activity:   . Days of Exercise per Week:   . Minutes of Exercise per Session:   Stress:   . Feeling of Stress :   Social Connections:   . Frequency of Communication with Friends and Family:   . Frequency of Social Gatherings with Friends and Family:   . Attends Religious Services:   . Active Member of Clubs or Organizations:   . Attends Archivist Meetings:   Marland Kitchen Marital Status:      Family History:  The patient's ***family history includes Coronary artery disease in an other family member.   ROS:   Please see the history of present illness.    ROS All other systems reviewed and are negative.   PHYSICAL EXAM:   VS:  There were no vitals taken for this visit.  Physical Exam  GEN: Well  nourished, well developed, in no acute distress  HEENT: normal  Neck: no JVD, carotid bruits, or masses Cardiac:RRR; no murmurs, rubs, or gallops  Respiratory:  clear to auscultation bilaterally, normal work of breathing GI: soft, nontender, nondistended, + BS Ext: without cyanosis, clubbing, or edema, Good distal pulses bilaterally MS: no deformity or atrophy  Skin: warm and dry, no rash Neuro:  Alert and Oriented x 3, Strength and sensation are intact Psych: euthymic mood, full affect  Wt Readings from Last 3 Encounters:  11/21/18 236 lb (107 kg)  11/12/18 241 lb 12.8 oz (109.7 kg)  12/26/17 245 lb (111.1 kg)      Studies/Labs Reviewed:   EKG:  EKG is*** ordered today.  The ekg ordered today demonstrates ***  Recent Labs: No results found for requested labs within last 8760 hours.   Lipid Panel    Component Value Date/Time   CHOL 115 (L) 07/02/2016 0735   TRIG 68 07/02/2016 0735   HDL 47 07/02/2016 0735   CHOLHDL 2.4 07/02/2016 0735   VLDL 14 07/02/2016 0735   LDLCALC 54 07/02/2016 0735    Additional studies/ records that were reviewed today include:   Cardiac cath 2/14/2020Prox RCA lesion is 20% stenosed.  Mid RCA lesion is 20% stenosed.  Prox Cx to Mid Cx lesion is 20% stenosed.  Ost LAD to Prox LAD lesion is 40% stenosed.  Mid LAD lesion is 40% stenosed.  Ost 1st Diag lesion is 40% stenosed.  The left ventricular ejection fraction is 45-50% by visual estimate.  There is mild left ventricular systolic dysfunction.  LV end diastolic pressure is mildly elevated.  There is no mitral valve regurgitation.   1. Patent stent proximal LAD with mild stent restenosis,unchanged from last cath in 2011. Mild mid and distal LAD plaque 2. Mild non-obstructive disease in the Circumflex 3. Mild non-obstructive plaque in the large, dominant RCA 4. Mild segmental LV systolic dysfunction with apical hypokinesis. LVEF=40-50%. Chronic finding.    Recommendation: Continue  medical management of CAD       ASSESSMENT:    1. Coronary artery disease involving native coronary artery of native heart without angina pectoris   2. Essential hypertension   3. Hyperlipidemia, unspecified hyperlipidemia type      PLAN:  In order of problems listed above:   CAD status post anterior MI in 2007 treated with stent to the LAD, follow-up cath in 2011 and 11/21/2018 mild stent restenosis 40% unchanged, mild nonobstructive disease in the circumflex and large dominant RCA, LVEF 40 to 50% with apical hypokinesis also unchanged  Essential hypertension  Hyperlipidemia   Medication Adjustments/Labs  and Tests Ordered: Current medicines are reviewed at length with the patient today.  Concerns regarding medicines are outlined above.  Medication changes, Labs and Tests ordered today are listed in the Patient Instructions below. There are no Patient Instructions on file for this visit.   Sumner Boast, PA-C  12/21/2019 4:01 PM    [facility] [address], [place], [place]  [address] Phone: [phone]; Fax: [phone]

## 2019-12-22 ENCOUNTER — Ambulatory Visit: Payer: HMO | Admitting: Physician Assistant

## 2020-01-07 ENCOUNTER — Other Ambulatory Visit: Payer: Self-pay | Admitting: Cardiology

## 2020-01-13 NOTE — Progress Notes (Deleted)
Cardiology Office Note    Date:  01/13/2020   ID:  Vincent [name], DOB Oct 06, 1951, MRN [id number]  PCP:  Marton Redwood, MD  Cardiologist: Lauree Chandler, MD EPS: None  No chief complaint on file.   History of Present Illness:  Vincent [name] is a 69 y.o. male with history of CAD status post anterior MI in 2007 treated with stent to the LAD, follow-up cath in 2011 and 11/21/2018 mild stent restenosis 40% unchanged, mild nonobstructive disease in the circumflex and large dominant RCA, LVEF 40 to 50% with apical hypokinesis also unchanged. Also has HTN and HLD.       Past Medical History:  Diagnosis Date  . Acute MI ([place])   . Allergy   . Anxiety   . Arthritis   . Coronary artery disease   . Depression   . Diabetes mellitus without complication ([place])   . Diverticulosis    mild  . Hemorrhoids   . HOH (hard of hearing)   . Hx of colonic polyps   . Hyperlipidemia   . Hypertension   . Left ventricular dysfunction    hx of    Past Surgical History:  Procedure Laterality Date  . ANGIOPLASTY     and bare metal stent placement  . ANKLE SURGERY Right 1986   i&d abscess- pt states "put a bone in it"  . CORONARY ANGIOPLASTY WITH STENT PLACEMENT    . KNEE ARTHROPLASTY Right 08/09/2016   Procedure: RIGHT TOTAL KNEE ARTHROPLASTY WITH COMPUTER NAVIGATION;  Surgeon: Rod Can, MD;  Location: [facility];  Service: Orthopedics;  Laterality: Right;  Needs RNFA  . LEFT HEART CATH AND CORONARY ANGIOGRAPHY N/A 11/21/2018   Procedure: LEFT HEART CATH AND CORONARY ANGIOGRAPHY;  Surgeon: Burnell Blanks, MD;  Location: [facility];  Service: Cardiovascular;  Laterality: N/A;    Current Medications: No outpatient medications have been marked as taking for the 01/20/20 encounter (Appointment) with Imogene Burn, PA-C.   Current Facility-Administered Medications for the 01/20/20 encounter (Appointment) with Imogene Burn, PA-C  Medication  . 0.9 %  sodium chloride  infusion     Allergies:   Penicillins   Social History   Socioeconomic History  . Marital status: Married    Spouse name: Not on file  . Number of children: Not on file  . Years of education: Not on file  . Highest education level: Not on file  Occupational History  . Occupation: full time    Employer: [employer]  Tobacco Use  . Smoking status: Former Smoker    Types: Cigars    Quit date: 12/17/2005    Years since quitting: 14.0  . Smokeless tobacco: Former Systems developer    Quit date: 12/17/2005  Substance and Sexual Activity  . Alcohol use: No  . Drug use: No  . Sexual activity: Not on file  Other Topics Concern  . Not on file  Social History Narrative  . Not on file   Social Determinants of Health   Financial Resource Strain:   . Difficulty of Paying Living Expenses:   Food Insecurity: No Food Insecurity  . Worried About Charity fundraiser in the Last Year: Never true  . Ran Out of Food in the Last Year: Never true  Transportation Needs: No Transportation Needs  . Lack of Transportation (Medical): No  . Lack of Transportation (Non-Medical): No  Physical Activity:   . Days of Exercise per Week:   . Minutes of  Exercise per Session:   Stress:   . Feeling of Stress :   Social Connections:   . Frequency of Communication with Friends and Family:   . Frequency of Social Gatherings with Friends and Family:   . Attends Religious Services:   . Active Member of Clubs or Organizations:   . Attends Archivist Meetings:   Marland Kitchen Marital Status:      Family History:  The patient's ***family history includes Coronary artery disease in an other family member.   ROS:   Please see the history of present illness.    ROS All other systems reviewed and are negative.   PHYSICAL EXAM:   VS:  There were no vitals taken for this visit.  Physical Exam  GEN: Well nourished, well developed, in no acute distress  HEENT: normal  Neck: no JVD, carotid bruits, or masses  Cardiac:RRR; no murmurs, rubs, or gallops  Respiratory:  clear to auscultation bilaterally, normal work of breathing GI: soft, nontender, nondistended, + BS Ext: without cyanosis, clubbing, or edema, Good distal pulses bilaterally MS: no deformity or atrophy  Skin: warm and dry, no rash Neuro:  Alert and Oriented x 3, Strength and sensation are intact Psych: euthymic mood, full affect  Wt Readings from Last 3 Encounters:  11/21/18 236 lb (107 kg)  11/12/18 241 lb 12.8 oz (109.7 kg)  12/26/17 245 lb (111.1 kg)      Studies/Labs Reviewed:   EKG:  EKG is*** ordered today.  The ekg ordered today demonstrates ***  Recent Labs: No results found for requested labs within last 8760 hours.   Lipid Panel    Component Value Date/Time   CHOL 115 (L) 07/02/2016 0735   TRIG 68 07/02/2016 0735   HDL 47 07/02/2016 0735   CHOLHDL 2.4 07/02/2016 0735   VLDL 14 07/02/2016 0735   LDLCALC 54 07/02/2016 0735    Additional studies/ records that were reviewed today include:  Cardiac cath 11/2018  Prox RCA lesion is 20% stenosed.  Mid RCA lesion is 20% stenosed.  Prox Cx to Mid Cx lesion is 20% stenosed.  Ost LAD to Prox LAD lesion is 40% stenosed.  Mid LAD lesion is 40% stenosed.  Ost 1st Diag lesion is 40% stenosed.  The left ventricular ejection fraction is 45-50% by visual estimate.  There is mild left ventricular systolic dysfunction.  LV end diastolic pressure is mildly elevated.  There is no mitral valve regurgitation.   1. Patent stent proximal LAD with mild stent restenosis,unchanged from last cath in 2011. Mild mid and distal LAD plaque 2. Mild non-obstructive disease in the Circumflex 3. Mild non-obstructive plaque in the large, dominant RCA 4. Mild segmental LV systolic dysfunction with apical hypokinesis. LVEF=40-50%. Chronic finding.    Recommendation: Continue medical management of CAD       ASSESSMENT:    No diagnosis found.   PLAN:  In order of  problems listed above:   CAD status post anterior MI in 2007 treated with stent to the LAD, follow-up cath in 2011 and 11/21/2018 mild stent restenosis 40% unchanged, mild nonobstructive disease in the circumflex and large dominant RCA, LVEF 40 to 50% with apical hypokinesis also unchanged  Essential hypertension  Hyperlipidemia    Medication Adjustments/Labs and Tests Ordered: Current medicines are reviewed at length with the patient today.  Concerns regarding medicines are outlined above.  Medication changes, Labs and Tests ordered today are listed in the Patient Instructions below. There are no Patient Instructions on file  for this visit.   Signed, Ermalinda Barrios, PA-C  01/13/2020 1:54 PM    [facility] [place], [place], [place]  [address] Phone: [phone]; Fax: [phone]

## 2020-01-18 ENCOUNTER — Other Ambulatory Visit: Payer: Self-pay | Admitting: Pharmacist

## 2020-01-18 NOTE — Patient Outreach (Signed)
[facility])  [place] Team    [facility] pharmacy case will be closed as our team is transitioning from the [place] Management Department into the [facility] Quality Department and will no longer be using [facility] for documentation purposes.     Patient did not return medication assistance forms.  Elayne Guerin, PharmD, [place] Clinical Pharmacist [phone]

## 2020-01-20 ENCOUNTER — Ambulatory Visit: Payer: Self-pay | Admitting: Pharmacist

## 2020-01-20 ENCOUNTER — Ambulatory Visit: Payer: HMO | Admitting: Physician Assistant

## 2020-02-01 DIAGNOSIS — Z1152 Encounter for screening for COVID-19: Secondary | ICD-10-CM | POA: Diagnosis not present

## 2020-02-04 ENCOUNTER — Other Ambulatory Visit: Payer: Self-pay | Admitting: Cardiology

## 2020-02-15 ENCOUNTER — Other Ambulatory Visit: Payer: Self-pay | Admitting: Cardiology

## 2020-03-10 ENCOUNTER — Other Ambulatory Visit: Payer: Self-pay | Admitting: Cardiovascular Disease

## 2020-03-24 ENCOUNTER — Other Ambulatory Visit: Payer: Self-pay

## 2020-03-24 MED ORDER — ISOSORBIDE MONONITRATE ER 30 MG PO TB24: 30.0000 mg | ORAL_TABLET | Freq: Every day | ORAL | 0 refills | Status: DC

## 2020-04-15 ENCOUNTER — Other Ambulatory Visit: Payer: Self-pay | Admitting: Cardiovascular Disease

## 2020-04-20 ENCOUNTER — Other Ambulatory Visit: Payer: Self-pay | Admitting: Cardiovascular Disease

## 2020-04-21 ENCOUNTER — Other Ambulatory Visit: Payer: Self-pay

## 2020-04-27 ENCOUNTER — Other Ambulatory Visit: Payer: Self-pay | Admitting: Cardiovascular Disease

## 2020-04-29 NOTE — Telephone Encounter (Signed)
Left message on home number and mobile number for patient to call back.

## 2020-05-04 ENCOUNTER — Other Ambulatory Visit: Payer: Self-pay

## 2020-05-04 NOTE — Patient Outreach (Signed)
[facility]) Care Management Chronic Special Needs Program  05/04/2020  Name: Vincent [name] DOB: [birth year]  MRN: [id number]  [name] is enrolled in a chronic special needs plan for Diabetes. Reviewed and updated care plan. Spoke with wife Vincent [name] Designated Party Release and clients request due to impaired hearing HIPAA verified  Subjective: States that client is needing to have his hip and knee replaced and he plans to go back to his doctor to talk about having the surgery.  States he is to see his doctor in August.  States he takes pain medication which helps some with his pain.  States he has had several falls with no injury when his knee gives out.  States he was not using his cane or walker when they happened.  States he does not check his blood sugars because his doctor told him he did not need to check.  States he is taking Jardiance and Metformin and he can afford the Jardiance at this time.  States she did not send assistance forms back and they will call if they need help later on in the year.  States his B/P is good when checked.  Denies any chest pains or shortness of breath.  States he still likes to eat sweets and chips but is trying to eat less often.  Declines Advanced Directives forms.  Wife states that they are going today to get their first COVID shots.  Goals Addressed              This Visit's Progress   .  COMPLETED: Client understands the importance of follow-up with providers by attending scheduled visits   On track     Keeping scheduled appointments Reinforced to keep scheduled appointments with providers Goal completed 05/05/20    .  Client verbalizes knowledge of Heart Attack self management skills within the next 6 months.(continue 05/04/20)   On track     Reports no issues with heart disease Follow a low salt diet  Notify provider for symptoms of chest pain, sweating, nausea/vomiting, irregular heartbeat, palpitations, rapid  heart rate, shortness of breath or dizziness or fainting. Call 911 for severe symptoms of chest pain or shortness of breath. Take medications as prescribed    .  COMPLETED: Client will report abillity to obtain Medications within next 6 months   On track     Reports affording medications at this time Goal completed 05/04/20    .  Client will verbalize knowledge of self management of Hypertension as evidences by BP reading of 140/90 or less; or as defined by provider   On track     Reports B/P below 140/90 when checked Plan to check B/P regularly Take B/P medications as ordered Plan to follow a low salt diet  Increase activity as tolerated    .  Diabetes Patient stated goal to keep hemogloblin A1C below than 7% for the next 6 months(revised and continued 05/04/20) (pt-stated)   Not on track     Hemoglobin A1C increased to 7.8% Reinforced following low carbohydrate low sodium diet and to avoid snacks high in carbohydrates     .  HEMOGLOBIN A1C < 7 (pt-stated)        Last Hemoglobin A1C 7.8% on 11/18/19 Reinforced to avoid snacking on high carbohydrate foods such as chips, cookies and cake Reviewed healthy snacks Follow Plate Method: (create a balanced plate): . 1/2 plate vegetables (broccoli, cauliflower, carrots, brussels sprouts, turnip greens), Aim for 1 cup cooked  or 2 cups raw . 1/4 starch (ex- pasta, rice, roll, bread), Aim for 1/2-1 cup, Choose whole grains whenever possible . 1/4 protein (ex- chicken, beef, pork & all beans), Aim for 1 palm size or 1/2 cup, choose lean protein whenever possible and limit processed meats . +1 healthy fat (ex- olive oil, avocado, nuts, seeds, dressing, mayonnaise), Aim for 2-3 teaspoons    .  LIFESTYLE - DECREASE FALLS RISK in the next 6 months        Educated on fall precautions and home safety Stand up slowly Use cane or walker at all times Use grabber to pick up objects on the floor Keep home well-lit and wear your glasses     .  COMPLETED:  Maintain timely refills of diabetic medication as prescribed within the year .   On track     Maintaining timely refills of medications per dispense report It is important to get your medications refilled on time Goal completed 05/04/20    .  COMPLETED: Obtain annual  Lipid Profile, LDL-C   On track     Completed 11/18/19 LDL 67 The goal for LDL is less than 70 mg/dL as you are at high risk for complications Try to avoid saturated fats, trans-fats and eat more fiber Plan to take statin as ordered  Goal completed 05/04/20    .  Obtain Annual Eye (retinal)  Exam    Not on track     Plan to have a dilated eye exam every year Encouraged to call and schedule eye appointment     .  Obtain Annual Foot Exam   On track     Completed 11/19/18 Check your skin and feet every day for cuts, bruises, redness, blisters, or sores. Schedule a foot exam with your health care provider once every year    .  COMPLETED: Obtain annual screen for micro albuminuria (urine) , nephropathy (kidney problems)   On track     Completed 12/15/19 It is important for your doctor to check your urine for protein at least every year Goal completed 05/04/20    .  Obtain Hemoglobin A1C at least 2 times per year   On track     Completed 11/18/19 It is important to have your Hemoglobin A1C checked every 6 months if you are at goal and every 3 months if you are not at goal    .  Patient Stated goal to improve coping with chronic pain in the next 6 months        Plan to discuss with your primary care provider about referral to orthopedic doctor Plan to take pain medication as directed and notify provider if pain worsens Sent EMMI: Chronic Pain    .  Visit Primary Care Provider or Endocrinologist at least 2 times per year    On track     Primary care provider 02/01/20 Please schedule your annual wellness visit     Reviewed COVID 19 precautions  Plan:  Send successful outreach letter with a copy of their individualized care plan,  Send individual care plan to provider and Send educational material-EMMI: Chronic Pain  Client will be outreached by a [employer] ([employer]) RNCM in 6 months per tier level     [place], Jackquline Denmark, [place] Management [phone]

## 2020-05-16 ENCOUNTER — Ambulatory Visit: Payer: HMO

## 2020-05-31 DIAGNOSIS — Z125 Encounter for screening for malignant neoplasm of prostate: Secondary | ICD-10-CM | POA: Diagnosis not present

## 2020-05-31 DIAGNOSIS — E785 Hyperlipidemia, unspecified: Secondary | ICD-10-CM | POA: Diagnosis not present

## 2020-05-31 DIAGNOSIS — E1129 Type 2 diabetes mellitus with other diabetic kidney complication: Secondary | ICD-10-CM | POA: Diagnosis not present

## 2020-06-02 DIAGNOSIS — E1129 Type 2 diabetes mellitus with other diabetic kidney complication: Secondary | ICD-10-CM | POA: Diagnosis not present

## 2020-06-02 DIAGNOSIS — Z0000 Encounter for general adult medical examination without abnormal findings: Secondary | ICD-10-CM | POA: Diagnosis not present

## 2020-06-02 DIAGNOSIS — I5020 Unspecified systolic (congestive) heart failure: Secondary | ICD-10-CM | POA: Diagnosis not present

## 2020-06-02 DIAGNOSIS — I252 Old myocardial infarction: Secondary | ICD-10-CM | POA: Diagnosis not present

## 2020-06-02 DIAGNOSIS — I25118 Atherosclerotic heart disease of native coronary artery with other forms of angina pectoris: Secondary | ICD-10-CM | POA: Diagnosis not present

## 2020-06-02 DIAGNOSIS — F39 Unspecified mood [affective] disorder: Secondary | ICD-10-CM | POA: Diagnosis not present

## 2020-06-02 DIAGNOSIS — E785 Hyperlipidemia, unspecified: Secondary | ICD-10-CM | POA: Diagnosis not present

## 2020-06-02 DIAGNOSIS — I10 Essential (primary) hypertension: Secondary | ICD-10-CM | POA: Diagnosis not present

## 2020-06-02 DIAGNOSIS — Z9861 Coronary angioplasty status: Secondary | ICD-10-CM | POA: Diagnosis not present

## 2020-06-02 DIAGNOSIS — G4733 Obstructive sleep apnea (adult) (pediatric): Secondary | ICD-10-CM | POA: Diagnosis not present

## 2020-06-02 DIAGNOSIS — I130 Hypertensive heart and chronic kidney disease with heart failure and stage 1 through stage 4 chronic kidney disease, or unspecified chronic kidney disease: Secondary | ICD-10-CM | POA: Diagnosis not present

## 2020-06-03 DIAGNOSIS — Z1212 Encounter for screening for malignant neoplasm of rectum: Secondary | ICD-10-CM | POA: Diagnosis not present

## 2020-06-03 DIAGNOSIS — I10 Essential (primary) hypertension: Secondary | ICD-10-CM | POA: Diagnosis not present

## 2020-06-03 DIAGNOSIS — R82998 Other abnormal findings in urine: Secondary | ICD-10-CM | POA: Diagnosis not present

## 2020-06-03 LAB — IFOBT (OCCULT BLOOD): IFOBT: NEGATIVE

## 2020-07-20 NOTE — Progress Notes (Signed)
Cardiology Office Note    Date:  07/26/2020   ID:  BLAYN WHETSELL, DOB [birth year], MRN [id number]  PCP:  Marton Redwood, MD  Cardiologist: Lauree Chandler, MD EPS: None  Chief Complaint  Patient presents with  . Follow-up    History of Present Illness:  Vincent [name] is a 69 y.o. male with history of CAD status post anterior MI in 2007 treated with stent to the LAD, most recent cath 11/2018 patent stent in the proximal LAD with mild stent restenosis unchanged from cath in 2011 mild nonobstructive disease in the circumflex and RCA, LVEF 40 to 50% chronic finding, ischemic cardiomyopathy, hypertension, HLD.  Patient comes in for f/u. Denies chest pain, dypsnea, palpitations, swelling. BP up today but says he hasn't had his meds. Main complaint is knee pain.     Past Medical History:  Diagnosis Date  . Acute MI ([place])   . Allergy   . Anxiety   . Arthritis   . Coronary artery disease   . Depression   . Diabetes mellitus without complication ([place])   . Diverticulosis    mild  . Hemorrhoids   . HOH (hard of hearing)   . Hx of colonic polyps   . Hyperlipidemia   . Hypertension   . Left ventricular dysfunction    hx of    Past Surgical History:  Procedure Laterality Date  . ANGIOPLASTY     and bare metal stent placement  . ANKLE SURGERY Right 1986   i&d abscess- pt states "put a bone in it"  . CORONARY ANGIOPLASTY WITH STENT PLACEMENT    . KNEE ARTHROPLASTY Right 08/09/2016   Procedure: RIGHT TOTAL KNEE ARTHROPLASTY WITH COMPUTER NAVIGATION;  Surgeon: Rod Can, MD;  Location: [facility];  Service: Orthopedics;  Laterality: Right;  Needs RNFA  . LEFT HEART CATH AND CORONARY ANGIOGRAPHY N/A 11/21/2018   Procedure: LEFT HEART CATH AND CORONARY ANGIOGRAPHY;  Surgeon: Burnell Blanks, MD;  Location: [facility];  Service: Cardiovascular;  Laterality: N/A;    Current Medications: Current Meds  Medication Sig  . aspirin EC 81 MG tablet Take 1 tablet (81  mg total) by mouth daily.  . cyclobenzaprine (FLEXERIL) 10 MG tablet Take 10 mg by mouth every 8 (eight) hours as needed for muscle spasms.  . furosemide (LASIX) 40 MG tablet Take 1 tablet (40 mg total) by mouth 2 (two) times daily.  Marland Kitchen HYDROcodone-acetaminophen (NORCO/VICODIN) 5-325 MG tablet Take 1 tablet by mouth every 8 (eight) hours as needed for moderate pain.   . isosorbide mononitrate (IMDUR) 30 MG 24 hr tablet Take 1 tablet (30 mg total) by mouth daily. Please make overdue appt with Dr. Angelena Form before anymore refills. 3rd and Final Attempt  . JARDIANCE 25 MG TABS tablet Take 25 mg by mouth daily.  Marland Kitchen LORazepam (ATIVAN) 1 MG tablet Take 1 mg by mouth every 8 (eight) hours as needed for anxiety.   . meloxicam (MOBIC) 7.5 MG tablet Take 1 tablet (7.5 mg total) by mouth daily. (Patient taking differently: Take 7.5 mg by mouth 2 (two) times daily as needed for pain. )  . metFORMIN (GLUCOPHAGE) 1000 MG tablet Take 1,000 mg by mouth 2 (two) times daily.  . metoprolol tartrate (LOPRESSOR) 50 MG tablet Take 1 tablet (50 mg total) by mouth 2 (two) times daily.  . nitroGLYCERIN (NITROSTAT) 0.4 MG SL tablet Place 1 tablet (0.4 mg total) under the tongue every 5 (five) minutes as needed for chest pain.  Marland Kitchen  ramipril (ALTACE) 2.5 MG capsule Take 1 capsule (2.5 mg total) by mouth daily. Please make overdue appt with Dr. Angelena Form before anymore refills. 2nd attempt  . sertraline (ZOLOFT) 50 MG tablet Take 50 mg by mouth daily.  . simvastatin (ZOCOR) 40 MG tablet Take 1 tablet (40 mg total) by mouth at bedtime.  . traMADol (ULTRAM) 50 MG tablet Take 50 mg by mouth every 8 (eight) hours as needed for moderate pain.    Current Facility-Administered Medications for the 07/26/20 encounter (Office Visit) with Imogene Burn, PA-C  Medication  . 0.9 %  sodium chloride infusion     Allergies:   Penicillins   Social History   Socioeconomic History  . Marital status: Married    Spouse name: Not on file  .  Number of children: Not on file  . Years of education: Not on file  . Highest education level: Not on file  Occupational History  . Occupation: full time    Employer: [employer]  Tobacco Use  . Smoking status: Former Smoker    Types: Cigars    Quit date: 12/17/2005    Years since quitting: 14.6  . Smokeless tobacco: Former Systems developer    Quit date: 12/17/2005  Vaping Use  . Vaping Use: Never used  Substance and Sexual Activity  . Alcohol use: No  . Drug use: No  . Sexual activity: Not on file  Other Topics Concern  . Not on file  Social History Narrative  . Not on file   Social Determinants of Health   Financial Resource Strain:   . Difficulty of Paying Living Expenses: Not on file  Food Insecurity: No Food Insecurity  . Worried About Charity fundraiser in the Last Year: Never true  . Ran Out of Food in the Last Year: Never true  Transportation Needs: No Transportation Needs  . Lack of Transportation (Medical): No  . Lack of Transportation (Non-Medical): No  Physical Activity:   . Days of Exercise per Week: Not on file  . Minutes of Exercise per Session: Not on file  Stress:   . Feeling of Stress : Not on file  Social Connections:   . Frequency of Communication with Friends and Family: Not on file  . Frequency of Social Gatherings with Friends and Family: Not on file  . Attends Religious Services: Not on file  . Active Member of Clubs or Organizations: Not on file  . Attends Archivist Meetings: Not on file  . Marital Status: Not on file     Family History:  The patient's family history includes Coronary artery disease in an other family member.   ROS:   Please see the history of present illness.    ROS All other systems reviewed and are negative.   PHYSICAL EXAM:   VS:  BP (!) 160/78   Pulse 96   Ht 5\' 8"  (1.727 m)   Wt 225 lb (102.1 kg)   SpO2 95%   BMI 34.21 kg/m   Physical Exam  GEN: Obese, in no acute distress  Neck: no JVD, carotid  bruits, or masses Cardiac:RRR; no murmurs, rubs, or gallops  Respiratory:  clear to auscultation bilaterally, normal work of breathing GI: soft, nontender, nondistended, + BS Ext: without cyanosis, clubbing, or edema, Good distal pulses bilaterally Neuro:  Alert and Oriented x 3 Psych: euthymic mood, full affect  Wt Readings from Last 3 Encounters:  07/26/20 225 lb (102.1 kg)  11/21/18 236 lb (107  kg)  11/12/18 241 lb 12.8 oz (109.7 kg)      Studies/Labs Reviewed:   EKG:  EKG is ordered today.  The ekg ordered today demonstrates NSR with nonspecific ST changes. No acute change  Recent Labs: No results found for requested labs within last 8760 hours.   Lipid Panel    Component Value Date/Time   CHOL 115 (L) 07/02/2016 0735   TRIG 68 07/02/2016 0735   HDL 47 07/02/2016 0735   CHOLHDL 2.4 07/02/2016 0735   VLDL 14 07/02/2016 0735   LDLCALC 54 07/02/2016 0735    Additional studies/ records that were reviewed today include:  Cardiac cath 11/2018  Prox RCA lesion is 20% stenosed.  Mid RCA lesion is 20% stenosed.  Prox Cx to Mid Cx lesion is 20% stenosed.  Ost LAD to Prox LAD lesion is 40% stenosed.  Mid LAD lesion is 40% stenosed.  Ost 1st Diag lesion is 40% stenosed.  The left ventricular ejection fraction is 45-50% by visual estimate.  There is mild left ventricular systolic dysfunction.  LV end diastolic pressure is mildly elevated.  There is no mitral valve regurgitation.   1. Patent stent proximal LAD with mild stent restenosis,unchanged from last cath in 2011. Mild mid and distal LAD plaque 2. Mild non-obstructive disease in the Circumflex 3. Mild non-obstructive plaque in the large, dominant RCA 4. Mild segmental LV systolic dysfunction with apical hypokinesis. LVEF=40-50%. Chronic finding.    Recommendation: Continue medical management of CAD       ASSESSMENT:    1. Coronary artery disease involving native coronary artery of native heart without  angina pectoris   2. Ischemic cardiomyopathy   3. Essential hypertension   4. Hyperlipidemia, unspecified hyperlipidemia type      PLAN:  In order of problems listed above:   CAD status post anterior MI in 2007 treated with stent to the LAD, follow-up cath in 2011 and 11/21/2018 mild stent restenosis 40% unchanged, mild nonobstructive disease in the circumflex and large dominant RCA, LVEF 40 to 50% with apical hypokinesis also unchanged. No angina.  Continue Imdur, metoprolol, ramipril  ICM compensated today on Lasix 40 mg twice a day.  Patient says he had blood work by PCP within the last 2 months.  We will try to get a copy of those.  Essential hypertension BP running high today but hasn't had meds yet. Says it's usually 120/80.  Hyperlipidemia LDL 67 11/2019 on Zocor    Medication Adjustments/Labs and Tests Ordered: Current medicines are reviewed at length with the patient today.  Concerns regarding medicines are outlined above.  Medication changes, Labs and Tests ordered today are listed in the Patient Instructions below. Patient Instructions  Medication Instructions:  Your physician recommends that you continue on your current medications as directed. Please refer to the Current Medication list given to you today.  *If you need a refill on your cardiac medications before your next appointment, please call your pharmacy*   Lab Work: None If you have labs (blood work) drawn today and your tests are completely normal, you will receive your results only by: Marland Kitchen MyChart Message (if you have MyChart) OR . A paper copy in the mail If you have any lab test that is abnormal or we need to change your treatment, we will call you to review the results.   Testing/Procedures: None   Follow-Up: At [facility], you and your health needs are our priority.  As part of our continuing mission to provide you  with exceptional heart care, we have created designated Provider Care Teams.  These  Care Teams include your primary Cardiologist (physician) and Advanced Practice Providers (APPs -  Physician Assistants and Nurse Practitioners) who all work together to provide you with the care you need, when you need it.  We recommend signing up for the patient portal called "MyChart".  Sign up information is provided on this After Visit Summary.  MyChart is used to connect with patients for Virtual Visits (Telemedicine).  Patients are able to view lab/test results, encounter notes, upcoming appointments, etc.  Non-urgent messages can be sent to your provider as well.   To learn more about what you can do with MyChart, go to [email or web address].    Your next appointment:   6 month(s)  The format for your next appointment:   In Person  Provider:   You may see Lauree Chandler, MD or one of the following Advanced Practice Providers on your designated Care Team:    Melina Copa, PA-C  Ermalinda Barrios, PA-C       Signed, Ermalinda Barrios, PA-C  07/26/2020 9:03 AM    [address], [place], [place]  [address] Phone: [phone]; Fax: [phone]

## 2020-07-26 ENCOUNTER — Encounter: Payer: Self-pay | Admitting: Physician Assistant

## 2020-07-26 ENCOUNTER — Telehealth: Payer: Self-pay

## 2020-07-26 ENCOUNTER — Ambulatory Visit (INDEPENDENT_AMBULATORY_CARE_PROVIDER_SITE_OTHER): Payer: HMO | Admitting: Physician Assistant

## 2020-07-26 ENCOUNTER — Other Ambulatory Visit: Payer: Self-pay

## 2020-07-26 VITALS — BP 160/78 | HR 96 | Ht 68.0 in | Wt 225.0 lb

## 2020-07-26 DIAGNOSIS — I10 Essential (primary) hypertension: Secondary | ICD-10-CM

## 2020-07-26 DIAGNOSIS — E785 Hyperlipidemia, unspecified: Secondary | ICD-10-CM | POA: Diagnosis not present

## 2020-07-26 DIAGNOSIS — I2510 Atherosclerotic heart disease of native coronary artery without angina pectoris: Secondary | ICD-10-CM | POA: Diagnosis not present

## 2020-07-26 DIAGNOSIS — I255 Ischemic cardiomyopathy: Secondary | ICD-10-CM

## 2020-07-26 NOTE — Patient Instructions (Signed)
Medication Instructions:  Your physician recommends that you continue on your current medications as directed. Please refer to the Current Medication list given to you today.  *If you need a refill on your cardiac medications before your next appointment, please call your pharmacy*   Lab Work: None If you have labs (blood work) drawn today and your tests are completely normal, you will receive your results only by: Marland Kitchen MyChart Message (if you have MyChart) OR . A paper copy in the mail If you have any lab test that is abnormal or we need to change your treatment, we will call you to review the results.   Testing/Procedures: None   Follow-Up: At [facility], you and your health needs are our priority.  As part of our continuing mission to provide you with exceptional heart care, we have created designated Provider Care Teams.  These Care Teams include your primary Cardiologist (physician) and Advanced Practice Providers (APPs -  Physician Assistants and Nurse Practitioners) who all work together to provide you with the care you need, when you need it.  We recommend signing up for the patient portal called "MyChart".  Sign up information is provided on this After Visit Summary.  MyChart is used to connect with patients for Virtual Visits (Telemedicine).  Patients are able to view lab/test results, encounter notes, upcoming appointments, etc.  Non-urgent messages can be sent to your provider as well.   To learn more about what you can do with MyChart, go to [email or web address].    Your next appointment:   6 month(s)  The format for your next appointment:   In Person  Provider:   You may see Lauree Chandler, MD or one of the following Advanced Practice Providers on your designated Care Team:    Melina Copa, PA-C  Ermalinda Barrios, PA-C

## 2020-07-26 NOTE — Telephone Encounter (Signed)
LM with Medical Records @ Dr Manya Silvas office requesting most recent labs on this pt.

## 2020-07-31 ENCOUNTER — Other Ambulatory Visit: Payer: Self-pay | Admitting: Cardiovascular Disease

## 2020-08-11 ENCOUNTER — Other Ambulatory Visit: Payer: Self-pay | Admitting: Cardiovascular Disease

## 2020-09-13 ENCOUNTER — Other Ambulatory Visit: Payer: Self-pay

## 2020-09-13 NOTE — Patient Outreach (Signed)
  [facility]) Care Management Chronic Special Needs Program    09/13/2020  Name: Vincent [name], DOB: Dec 30, 1950  MRN: [id number]   [name] is enrolled in a chronic special needs plan for Diabetes.  [facility] will continue to provide services for this client through 10/07/2020. The [facility] [employer] care management team will assume care 10/08/2020.  Peter Garter RN, Jackquline Denmark, CDE Chronic Care Management Coordinator [facility] [phone]

## 2020-09-21 ENCOUNTER — Other Ambulatory Visit: Payer: Self-pay | Admitting: Cardiology

## 2020-09-29 ENCOUNTER — Other Ambulatory Visit: Payer: Self-pay

## 2020-09-29 NOTE — Patient Outreach (Signed)
  [facility]) Care Management Chronic Special Needs Program    09/29/2020  Name: GLENMORE KARL, DOB: 05-20-1951  MRN: [id number]   Mr. Reyes Aldaco is enrolled in a chronic special needs plan for Diabetes.  [facility] Care Management Team has assumed care and services for this member. Case closed by [place] RN, [facility], [facility] [phone]

## 2020-12-22 ENCOUNTER — Other Ambulatory Visit: Payer: Self-pay | Admitting: Internal Medicine

## 2020-12-22 DIAGNOSIS — I5020 Unspecified systolic (congestive) heart failure: Secondary | ICD-10-CM | POA: Diagnosis not present

## 2020-12-22 DIAGNOSIS — Z9861 Coronary angioplasty status: Secondary | ICD-10-CM | POA: Diagnosis not present

## 2020-12-22 DIAGNOSIS — R410 Disorientation, unspecified: Secondary | ICD-10-CM | POA: Diagnosis not present

## 2020-12-22 DIAGNOSIS — T8484XD Pain due to internal orthopedic prosthetic devices, implants and grafts, subsequent encounter: Secondary | ICD-10-CM | POA: Diagnosis not present

## 2020-12-22 DIAGNOSIS — F39 Unspecified mood [affective] disorder: Secondary | ICD-10-CM | POA: Diagnosis not present

## 2020-12-22 DIAGNOSIS — I130 Hypertensive heart and chronic kidney disease with heart failure and stage 1 through stage 4 chronic kidney disease, or unspecified chronic kidney disease: Secondary | ICD-10-CM | POA: Diagnosis not present

## 2020-12-22 DIAGNOSIS — E1129 Type 2 diabetes mellitus with other diabetic kidney complication: Secondary | ICD-10-CM | POA: Diagnosis not present

## 2020-12-22 DIAGNOSIS — E785 Hyperlipidemia, unspecified: Secondary | ICD-10-CM | POA: Diagnosis not present

## 2020-12-22 DIAGNOSIS — I10 Essential (primary) hypertension: Secondary | ICD-10-CM | POA: Diagnosis not present

## 2020-12-22 DIAGNOSIS — R296 Repeated falls: Secondary | ICD-10-CM

## 2020-12-22 DIAGNOSIS — I25118 Atherosclerotic heart disease of native coronary artery with other forms of angina pectoris: Secondary | ICD-10-CM | POA: Diagnosis not present

## 2020-12-27 ENCOUNTER — Ambulatory Visit
Admission: RE | Admit: 2020-12-27 | Discharge: 2020-12-27 | Disposition: A | Discharge status: Home / Self Care | Payer: HMO | Source: Ambulatory Visit | Attending: Internal Medicine | Admitting: Internal Medicine

## 2020-12-27 ENCOUNTER — Other Ambulatory Visit: Payer: Self-pay

## 2020-12-27 DIAGNOSIS — R296 Repeated falls: Secondary | ICD-10-CM

## 2020-12-27 MED ORDER — GADOBENATE DIMEGLUMINE 529 MG/ML IV SOLN: 20.0000 mL | Freq: Once | INTRAVENOUS | Status: DC | PRN

## 2021-01-11 ENCOUNTER — Other Ambulatory Visit (HOSPITAL_COMMUNITY): Payer: Self-pay | Admitting: Internal Medicine

## 2021-01-11 ENCOUNTER — Other Ambulatory Visit: Payer: Self-pay | Admitting: Internal Medicine

## 2021-01-11 DIAGNOSIS — R296 Repeated falls: Secondary | ICD-10-CM

## 2021-01-12 ENCOUNTER — Inpatient Hospital Stay (HOSPITAL_COMMUNITY): Payer: HMO

## 2021-01-12 ENCOUNTER — Other Ambulatory Visit: Payer: Self-pay

## 2021-01-12 ENCOUNTER — Encounter (HOSPITAL_COMMUNITY): Payer: Self-pay

## 2021-01-12 ENCOUNTER — Ambulatory Visit (HOSPITAL_COMMUNITY): Admission: RE | Admit: 2021-01-12 | Payer: HMO | Source: Ambulatory Visit

## 2021-01-12 ENCOUNTER — Emergency Department (HOSPITAL_COMMUNITY): Payer: HMO

## 2021-01-12 ENCOUNTER — Inpatient Hospital Stay (HOSPITAL_COMMUNITY)
Admission: EM | Admit: 2021-01-12 | Discharge: 2021-01-27 | DRG: 091 | Disposition: A | Discharge status: Skilled Nursing Facility | Payer: HMO | Attending: Internal Medicine | Admitting: Internal Medicine

## 2021-01-12 DIAGNOSIS — K5900 Constipation, unspecified: Secondary | ICD-10-CM | POA: Diagnosis present

## 2021-01-12 DIAGNOSIS — K828 Other specified diseases of gallbladder: Secondary | ICD-10-CM | POA: Diagnosis not present

## 2021-01-12 DIAGNOSIS — Z20822 Contact with and (suspected) exposure to covid-19: Secondary | ICD-10-CM | POA: Diagnosis present

## 2021-01-12 DIAGNOSIS — R4182 Altered mental status, unspecified: Secondary | ICD-10-CM | POA: Diagnosis not present

## 2021-01-12 DIAGNOSIS — R110 Nausea: Secondary | ICD-10-CM | POA: Diagnosis not present

## 2021-01-12 DIAGNOSIS — R000 Tachycardia, unspecified: Secondary | ICD-10-CM | POA: Diagnosis present

## 2021-01-12 DIAGNOSIS — R471 Dysarthria and anarthria: Secondary | ICD-10-CM | POA: Diagnosis not present

## 2021-01-12 DIAGNOSIS — Z955 Presence of coronary angioplasty implant and graft: Secondary | ICD-10-CM | POA: Diagnosis not present

## 2021-01-12 DIAGNOSIS — I5042 Chronic combined systolic (congestive) and diastolic (congestive) heart failure: Secondary | ICD-10-CM | POA: Diagnosis not present

## 2021-01-12 DIAGNOSIS — J690 Pneumonitis due to inhalation of food and vomit: Secondary | ICD-10-CM | POA: Diagnosis not present

## 2021-01-12 DIAGNOSIS — G928 Other toxic encephalopathy: Principal | ICD-10-CM | POA: Diagnosis present

## 2021-01-12 DIAGNOSIS — N289 Disorder of kidney and ureter, unspecified: Secondary | ICD-10-CM | POA: Diagnosis not present

## 2021-01-12 DIAGNOSIS — I2510 Atherosclerotic heart disease of native coronary artery without angina pectoris: Secondary | ICD-10-CM | POA: Diagnosis present

## 2021-01-12 DIAGNOSIS — R0600 Dyspnea, unspecified: Secondary | ICD-10-CM | POA: Diagnosis not present

## 2021-01-12 DIAGNOSIS — K819 Cholecystitis, unspecified: Secondary | ICD-10-CM | POA: Diagnosis not present

## 2021-01-12 DIAGNOSIS — Y929 Unspecified place or not applicable: Secondary | ICD-10-CM

## 2021-01-12 DIAGNOSIS — M47812 Spondylosis without myelopathy or radiculopathy, cervical region: Secondary | ICD-10-CM | POA: Diagnosis not present

## 2021-01-12 DIAGNOSIS — R296 Repeated falls: Secondary | ICD-10-CM | POA: Diagnosis present

## 2021-01-12 DIAGNOSIS — E139 Other specified diabetes mellitus without complications: Secondary | ICD-10-CM | POA: Diagnosis present

## 2021-01-12 DIAGNOSIS — R1312 Dysphagia, oropharyngeal phase: Secondary | ICD-10-CM | POA: Diagnosis not present

## 2021-01-12 DIAGNOSIS — E538 Deficiency of other specified B group vitamins: Secondary | ICD-10-CM | POA: Diagnosis present

## 2021-01-12 DIAGNOSIS — R2681 Unsteadiness on feet: Secondary | ICD-10-CM | POA: Diagnosis not present

## 2021-01-12 DIAGNOSIS — F419 Anxiety disorder, unspecified: Secondary | ICD-10-CM | POA: Diagnosis present

## 2021-01-12 DIAGNOSIS — G894 Chronic pain syndrome: Secondary | ICD-10-CM | POA: Diagnosis not present

## 2021-01-12 DIAGNOSIS — I255 Ischemic cardiomyopathy: Secondary | ICD-10-CM | POA: Diagnosis not present

## 2021-01-12 DIAGNOSIS — R404 Transient alteration of awareness: Secondary | ICD-10-CM | POA: Diagnosis not present

## 2021-01-12 DIAGNOSIS — Z7984 Long term (current) use of oral hypoglycemic drugs: Secondary | ICD-10-CM

## 2021-01-12 DIAGNOSIS — E785 Hyperlipidemia, unspecified: Secondary | ICD-10-CM | POA: Diagnosis present

## 2021-01-12 DIAGNOSIS — H9190 Unspecified hearing loss, unspecified ear: Secondary | ICD-10-CM | POA: Diagnosis present

## 2021-01-12 DIAGNOSIS — R109 Unspecified abdominal pain: Secondary | ICD-10-CM | POA: Diagnosis not present

## 2021-01-12 DIAGNOSIS — J432 Centrilobular emphysema: Secondary | ICD-10-CM | POA: Diagnosis not present

## 2021-01-12 DIAGNOSIS — I110 Hypertensive heart disease with heart failure: Secondary | ICD-10-CM | POA: Diagnosis not present

## 2021-01-12 DIAGNOSIS — E871 Hypo-osmolality and hyponatremia: Secondary | ICD-10-CM | POA: Diagnosis not present

## 2021-01-12 DIAGNOSIS — I6389 Other cerebral infarction: Secondary | ICD-10-CM | POA: Diagnosis present

## 2021-01-12 DIAGNOSIS — I6300 Cerebral infarction due to thrombosis of unspecified precerebral artery: Secondary | ICD-10-CM | POA: Diagnosis not present

## 2021-01-12 DIAGNOSIS — Z87891 Personal history of nicotine dependence: Secondary | ICD-10-CM | POA: Diagnosis not present

## 2021-01-12 DIAGNOSIS — A419 Sepsis, unspecified organism: Secondary | ICD-10-CM | POA: Diagnosis not present

## 2021-01-12 DIAGNOSIS — R519 Headache, unspecified: Secondary | ICD-10-CM | POA: Diagnosis not present

## 2021-01-12 DIAGNOSIS — F32A Depression, unspecified: Secondary | ICD-10-CM | POA: Diagnosis present

## 2021-01-12 DIAGNOSIS — Z79891 Long term (current) use of opiate analgesic: Secondary | ICD-10-CM | POA: Diagnosis not present

## 2021-01-12 DIAGNOSIS — G4700 Insomnia, unspecified: Secondary | ICD-10-CM | POA: Diagnosis present

## 2021-01-12 DIAGNOSIS — G4733 Obstructive sleep apnea (adult) (pediatric): Secondary | ICD-10-CM | POA: Diagnosis not present

## 2021-01-12 DIAGNOSIS — Z96651 Presence of right artificial knee joint: Secondary | ICD-10-CM | POA: Diagnosis not present

## 2021-01-12 DIAGNOSIS — I10 Essential (primary) hypertension: Secondary | ICD-10-CM

## 2021-01-12 DIAGNOSIS — I69322 Dysarthria following cerebral infarction: Secondary | ICD-10-CM | POA: Diagnosis not present

## 2021-01-12 DIAGNOSIS — R52 Pain, unspecified: Secondary | ICD-10-CM | POA: Diagnosis not present

## 2021-01-12 DIAGNOSIS — M6281 Muscle weakness (generalized): Secondary | ICD-10-CM | POA: Diagnosis not present

## 2021-01-12 DIAGNOSIS — I69320 Aphasia following cerebral infarction: Secondary | ICD-10-CM | POA: Diagnosis not present

## 2021-01-12 DIAGNOSIS — I639 Cerebral infarction, unspecified: Secondary | ICD-10-CM | POA: Diagnosis not present

## 2021-01-12 DIAGNOSIS — Z781 Physical restraint status: Secondary | ICD-10-CM

## 2021-01-12 DIAGNOSIS — G9389 Other specified disorders of brain: Secondary | ICD-10-CM | POA: Diagnosis not present

## 2021-01-12 DIAGNOSIS — E119 Type 2 diabetes mellitus without complications: Secondary | ICD-10-CM | POA: Diagnosis present

## 2021-01-12 DIAGNOSIS — N200 Calculus of kidney: Secondary | ICD-10-CM | POA: Diagnosis not present

## 2021-01-12 DIAGNOSIS — E669 Obesity, unspecified: Secondary | ICD-10-CM | POA: Diagnosis present

## 2021-01-12 DIAGNOSIS — R339 Retention of urine, unspecified: Secondary | ICD-10-CM | POA: Diagnosis not present

## 2021-01-12 DIAGNOSIS — N179 Acute kidney failure, unspecified: Secondary | ICD-10-CM | POA: Diagnosis not present

## 2021-01-12 DIAGNOSIS — Z7401 Bed confinement status: Secondary | ICD-10-CM | POA: Diagnosis not present

## 2021-01-12 DIAGNOSIS — D72829 Elevated white blood cell count, unspecified: Secondary | ICD-10-CM | POA: Diagnosis not present

## 2021-01-12 DIAGNOSIS — R531 Weakness: Secondary | ICD-10-CM

## 2021-01-12 DIAGNOSIS — R4781 Slurred speech: Secondary | ICD-10-CM | POA: Diagnosis not present

## 2021-01-12 DIAGNOSIS — T50915A Adverse effect of multiple unspecified drugs, medicaments and biological substances, initial encounter: Secondary | ICD-10-CM | POA: Diagnosis present

## 2021-01-12 DIAGNOSIS — M2550 Pain in unspecified joint: Secondary | ICD-10-CM | POA: Diagnosis not present

## 2021-01-12 DIAGNOSIS — W19XXXA Unspecified fall, initial encounter: Secondary | ICD-10-CM | POA: Diagnosis not present

## 2021-01-12 DIAGNOSIS — M4802 Spinal stenosis, cervical region: Secondary | ICD-10-CM | POA: Diagnosis present

## 2021-01-12 DIAGNOSIS — Z6834 Body mass index (BMI) 34.0-34.9, adult: Secondary | ICD-10-CM

## 2021-01-12 DIAGNOSIS — R410 Disorientation, unspecified: Secondary | ICD-10-CM | POA: Diagnosis not present

## 2021-01-12 DIAGNOSIS — R2689 Other abnormalities of gait and mobility: Secondary | ICD-10-CM | POA: Diagnosis not present

## 2021-01-12 DIAGNOSIS — Z7982 Long term (current) use of aspirin: Secondary | ICD-10-CM

## 2021-01-12 DIAGNOSIS — M160 Bilateral primary osteoarthritis of hip: Secondary | ICD-10-CM | POA: Diagnosis not present

## 2021-01-12 DIAGNOSIS — R262 Difficulty in walking, not elsewhere classified: Secondary | ICD-10-CM | POA: Diagnosis not present

## 2021-01-12 DIAGNOSIS — M25561 Pain in right knee: Secondary | ICD-10-CM | POA: Diagnosis not present

## 2021-01-12 DIAGNOSIS — R441 Visual hallucinations: Secondary | ICD-10-CM | POA: Diagnosis not present

## 2021-01-12 DIAGNOSIS — G459 Transient cerebral ischemic attack, unspecified: Secondary | ICD-10-CM | POA: Diagnosis not present

## 2021-01-12 DIAGNOSIS — R0602 Shortness of breath: Secondary | ICD-10-CM

## 2021-01-12 DIAGNOSIS — K812 Acute cholecystitis with chronic cholecystitis: Secondary | ICD-10-CM | POA: Diagnosis not present

## 2021-01-12 DIAGNOSIS — I5022 Chronic systolic (congestive) heart failure: Secondary | ICD-10-CM | POA: Diagnosis not present

## 2021-01-12 DIAGNOSIS — K8050 Calculus of bile duct without cholangitis or cholecystitis without obstruction: Secondary | ICD-10-CM | POA: Diagnosis not present

## 2021-01-12 DIAGNOSIS — Z7901 Long term (current) use of anticoagulants: Secondary | ICD-10-CM

## 2021-01-12 DIAGNOSIS — Z880 Allergy status to penicillin: Secondary | ICD-10-CM

## 2021-01-12 DIAGNOSIS — Z79899 Other long term (current) drug therapy: Secondary | ICD-10-CM

## 2021-01-12 DIAGNOSIS — K8020 Calculus of gallbladder without cholecystitis without obstruction: Secondary | ICD-10-CM | POA: Diagnosis not present

## 2021-01-12 DIAGNOSIS — I252 Old myocardial infarction: Secondary | ICD-10-CM

## 2021-01-12 DIAGNOSIS — I700 Atherosclerosis of aorta: Secondary | ICD-10-CM | POA: Diagnosis not present

## 2021-01-12 DIAGNOSIS — E860 Dehydration: Secondary | ICD-10-CM | POA: Diagnosis not present

## 2021-01-12 DIAGNOSIS — J9811 Atelectasis: Secondary | ICD-10-CM | POA: Diagnosis not present

## 2021-01-12 DIAGNOSIS — R079 Chest pain, unspecified: Secondary | ICD-10-CM | POA: Diagnosis not present

## 2021-01-12 DIAGNOSIS — R278 Other lack of coordination: Secondary | ICD-10-CM | POA: Diagnosis not present

## 2021-01-12 DIAGNOSIS — Z8249 Family history of ischemic heart disease and other diseases of the circulatory system: Secondary | ICD-10-CM

## 2021-01-12 DIAGNOSIS — Z794 Long term (current) use of insulin: Secondary | ICD-10-CM

## 2021-01-12 DIAGNOSIS — R509 Fever, unspecified: Secondary | ICD-10-CM | POA: Diagnosis not present

## 2021-01-12 DIAGNOSIS — Z86010 Personal history of colonic polyps: Secondary | ICD-10-CM

## 2021-01-12 LAB — CBC
HCT: 42.2 % (ref 39.0–52.0)
Hemoglobin: 14.2 g/dL (ref 13.0–17.0)
MCH: 30.9 pg (ref 26.0–34.0)
MCHC: 33.6 g/dL (ref 30.0–36.0)
MCV: 91.7 fL (ref 80.0–100.0)
Platelets: 246 10*3/uL (ref 150–400)
RBC: 4.6 MIL/uL (ref 4.22–5.81)
RDW: 13.1 % (ref 11.5–15.5)
WBC: 9.6 10*3/uL (ref 4.0–10.5)
nRBC: 0 % (ref 0.0–0.2)

## 2021-01-12 LAB — COMPREHENSIVE METABOLIC PANEL
ALT: 13 U/L (ref 0–44)
AST: 16 U/L (ref 15–41)
Albumin: 3.8 g/dL (ref 3.5–5.0)
Alkaline Phosphatase: 96 U/L (ref 38–126)
Anion gap: 7 (ref 5–15)
BUN: 9 mg/dL (ref 8–23)
CO2: 28 mmol/L (ref 22–32)
Calcium: 9.4 mg/dL (ref 8.9–10.3)
Chloride: 101 mmol/L (ref 98–111)
Creatinine, Ser: 0.96 mg/dL (ref 0.61–1.24)
GFR, Estimated: >60 mL/min (ref >60)
Glucose, Bld: 125 mg/dL — ABNORMAL HIGH (ref 70–99)
Potassium: 4.2 mmol/L (ref 3.5–5.1)
Sodium: 136 mmol/L (ref 135–145)
Total Bilirubin: 0.7 mg/dL (ref 0.3–1.2)
Total Protein: 6.1 g/dL — ABNORMAL LOW (ref 6.5–8.1)

## 2021-01-12 LAB — RESP PANEL BY RT-PCR (FLU A&B, COVID) ARPGX2
Influenza A by PCR: NEGATIVE
Influenza B by PCR: NEGATIVE
SARS Coronavirus 2 by RT PCR: NEGATIVE

## 2021-01-12 LAB — LIPID PANEL
Cholesterol: 118 mg/dL (ref 0–200)
HDL: 46 mg/dL (ref >40)
LDL Cholesterol: 60 mg/dL (ref 0–99)
Total CHOL/HDL Ratio: 2.6 RATIO
Triglycerides: 61 mg/dL (ref <150)
VLDL: 12 mg/dL (ref 0–40)

## 2021-01-12 LAB — TSH: TSH: 2.095 u[IU]/mL (ref 0.350–4.500)

## 2021-01-12 LAB — HEMOGLOBIN A1C
Hgb A1c MFr Bld: 7.2 % — ABNORMAL HIGH (ref 4.8–5.6)
Mean Plasma Glucose: 159.94 mg/dL

## 2021-01-12 LAB — AMMONIA: Ammonia: 11 umol/L (ref 9–35)

## 2021-01-12 IMAGING — CT CT CERVICAL SPINE W/O CM
3 of 4 series · 12 of 35 positions shown, 14 images · non-contrast
Comparison: None.

CLINICAL DATA: Slurred speech, weakness, confusion for a month.

EXAM:
CT HEAD WITHOUT CONTRAST
CT CERVICAL SPINE WITHOUT CONTRAST
TECHNIQUE: Multidetector CT imaging of the head and cervical spine was
performed following the standard protocol without intravenous
contrast. Multiplanar CT image reconstructions of the cervical spine
were also generated.

[Series 12: sag bone · sagittal · 0.28mm/px · 5 of 87 slices shown, 6 images]
[im 29/87  bone]
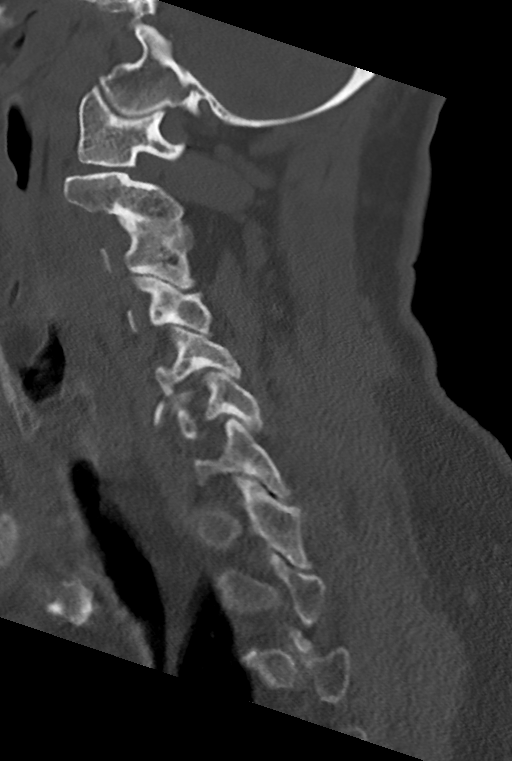
[im 36/87  bone]
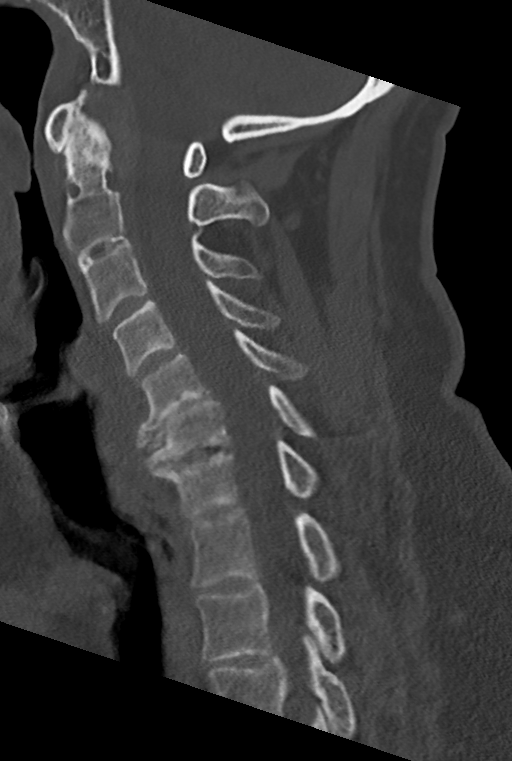
[im 44/87  soft-tissue]
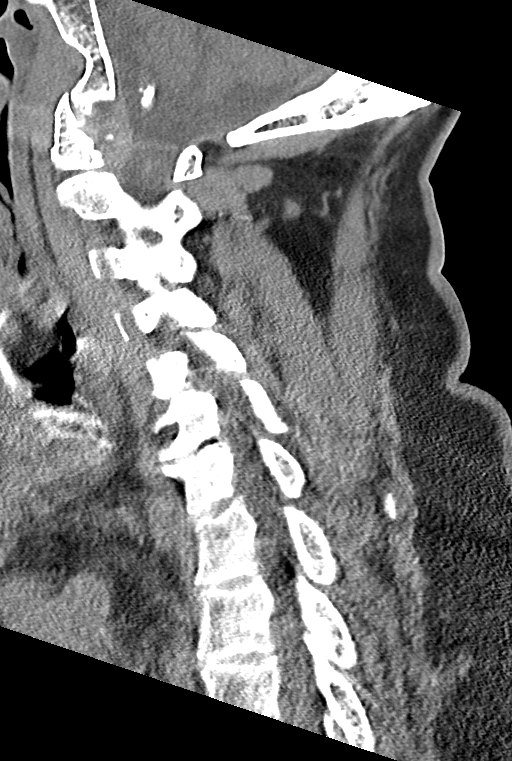
[im 44/87  bone]
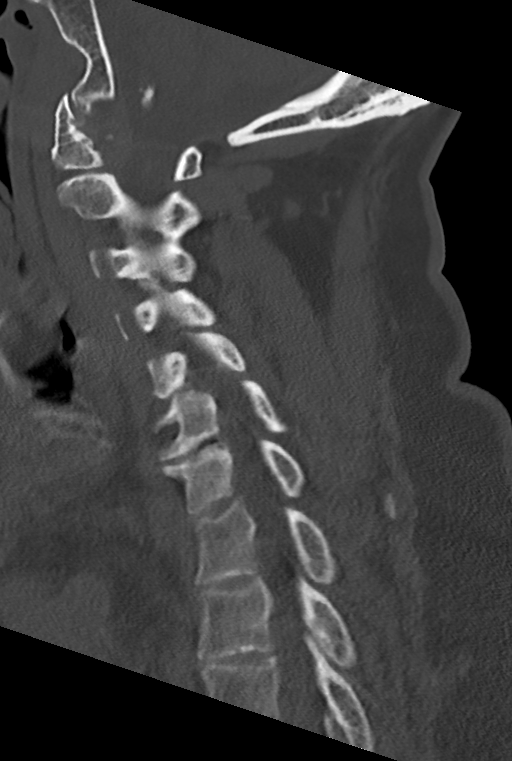
[im 51/87  bone]
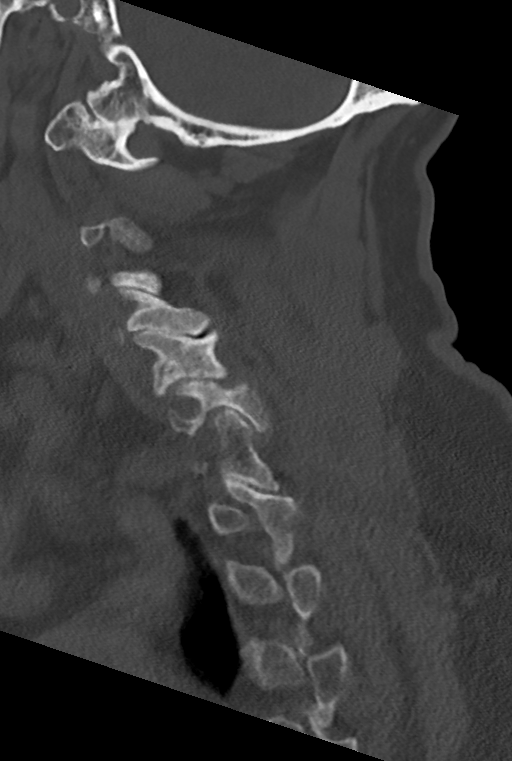
[im 58/87  bone]
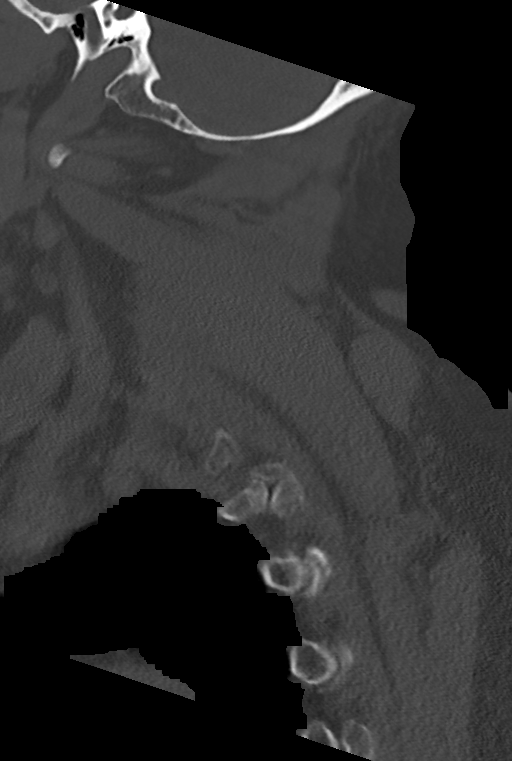

[Series 13: cor bone · coronal · 0.34mm/px · 3 of 68 slices shown]
[im 16/68  bone]
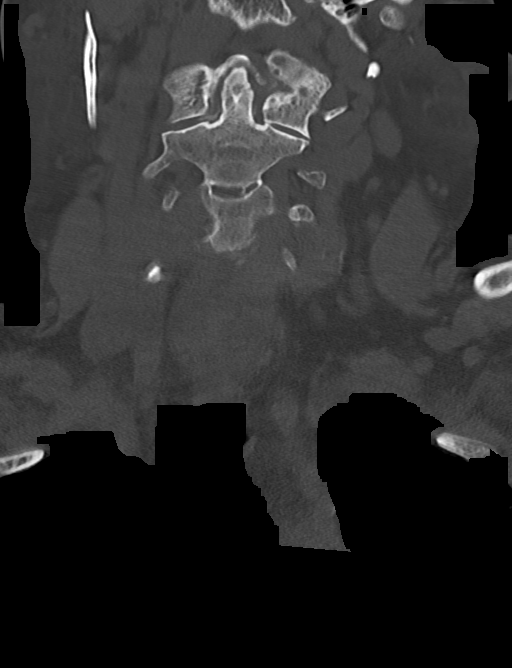
[im 28/68  bone]
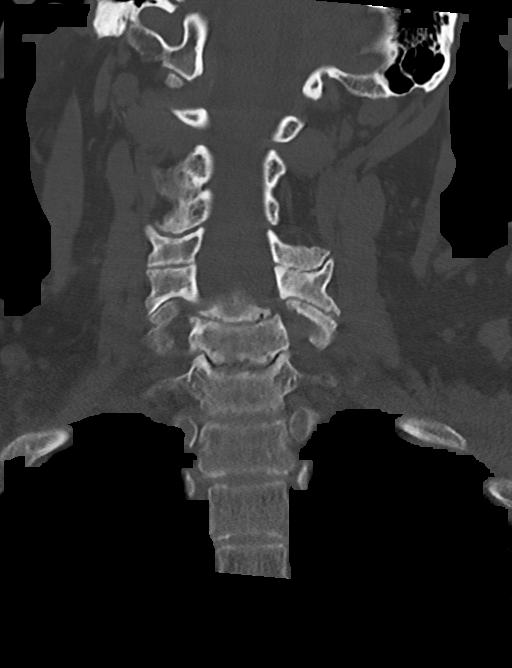
[im 40/68  bone]
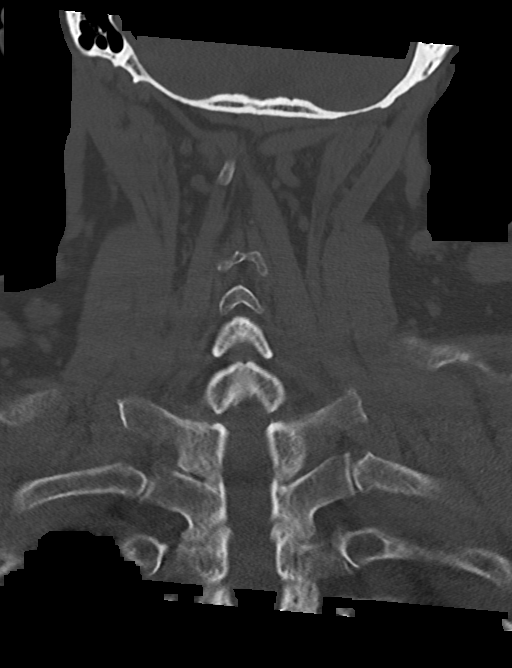

[Series 14: orthogonal axials · axial · 0.21mm/px · z∈[-319,-225]mm · 4 of 95 slices shown, 5 images]
[im 16/95  soft-tissue]
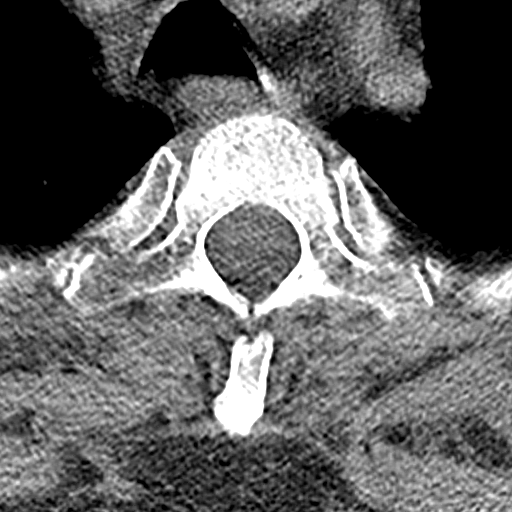
[im 16/95  bone]
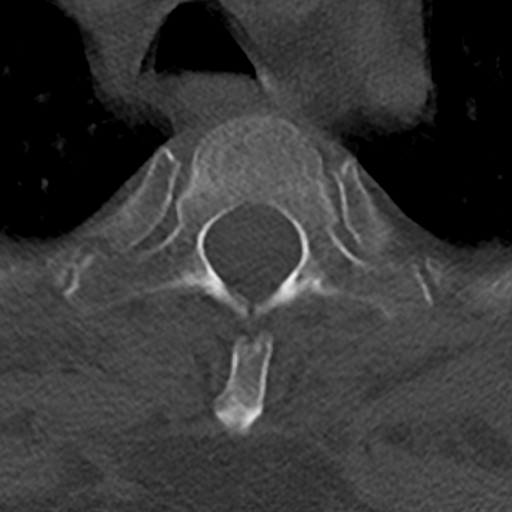
[im 32/95  bone]
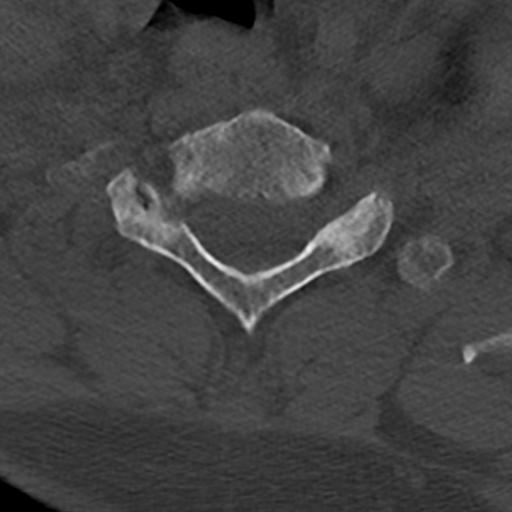
[im 63/95  bone]
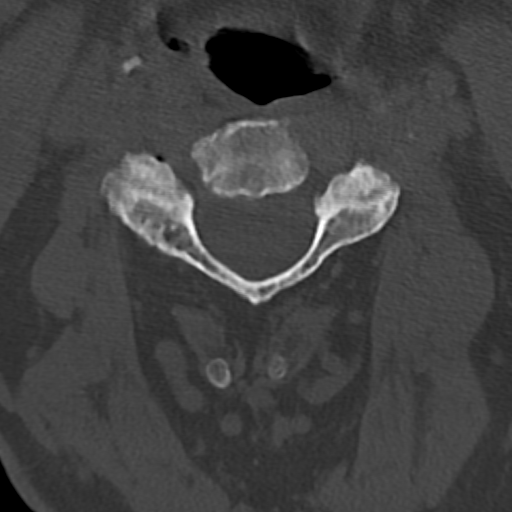
[im 79/95  bone]
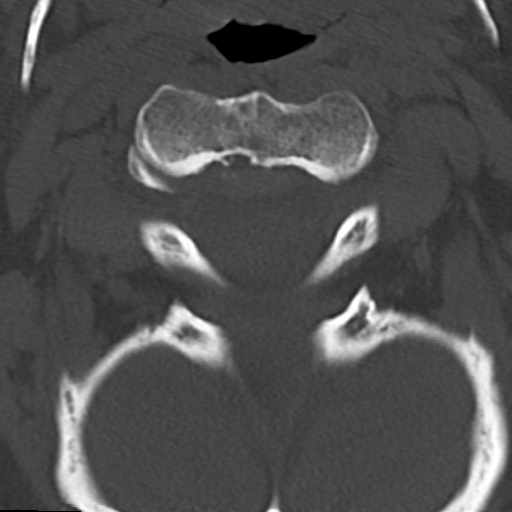

[12 of 35 positions shown; findings below may reference images not displayed]

FINDINGS: CT HEAD FINDINGS

Brain:

Patchy and confluent areas of decreased attenuation are noted
throughout the deep and periventricular white matter of the cerebral
hemispheres bilaterally, compatible with chronic microvascular
ischemic disease.

No evidence of large-territorial acute infarction. No parenchymal
hemorrhage. No mass lesion. No extra-axial collection.

No mass effect or midline shift. No hydrocephalus. Basilar cisterns
are patent.

Vascular: No hyperdense vessel. Atherosclerotic calcifications are
present within the cavernous internal carotid arteries.

Skull: No acute fracture or focal lesion.

Sinuses/Orbits: Paranasal sinuses and mastoid air cells are clear.
The orbits are unremarkable.

Other: None.

CT CERVICAL SPINE FINDINGS

Alignment: Reversal of the normal cervical lordosis centered at the
C6 level. Findings due to severe degenerative changes at the level.
Otherwise normal alignment.

Skull base and vertebrae: Multilevel degenerative changes of the
spine that are most prominent at the C1-C2 and C5 through C7 levels.
No acute fracture. No aggressive appearing focal osseous lesion or
focal pathologic process.

Soft tissues and spinal canal: No prevertebral fluid or swelling. No
visible canal hematoma.

Upper chest: Unremarkable.

Other: None.
IMPRESSION: 1. No acute intracranial abnormality.
2. No acutely displaced fracture or traumatic listhesis of the
cervical spine.

## 2021-01-12 IMAGING — CT CT HEAD W/O CM
4 series · 16 of 47 positions shown, 18 images · non-contrast
Comparison: None.

CLINICAL DATA: Slurred speech, weakness, confusion for a month.

EXAM:
CT HEAD WITHOUT CONTRAST
CT CERVICAL SPINE WITHOUT CONTRAST
TECHNIQUE: Multidetector CT imaging of the head and cervical spine was
performed following the standard protocol without intravenous
contrast. Multiplanar CT image reconstructions of the cervical spine
were also generated.

[Series 4: head wo · axial · 0.46mm/px · z∈[-178,-43]mm · 7 of 37 slices shown, 9 images]
[im 5/37  brain]
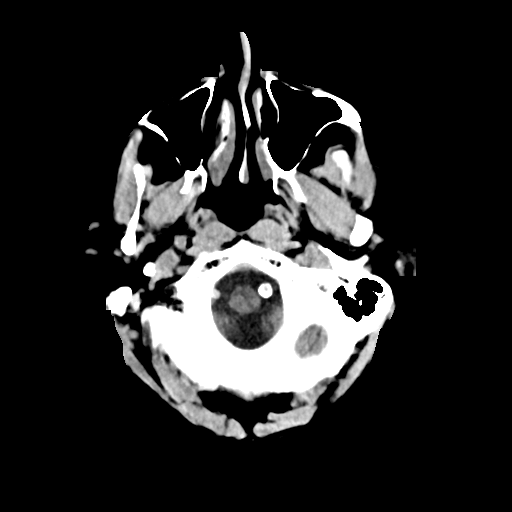
[im 5/37  bone]
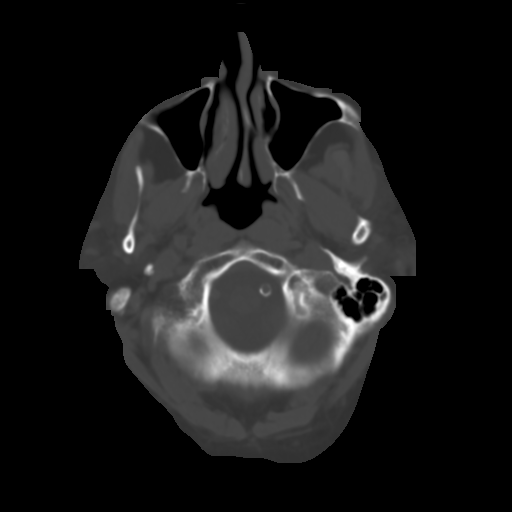
[im 10/37  brain]
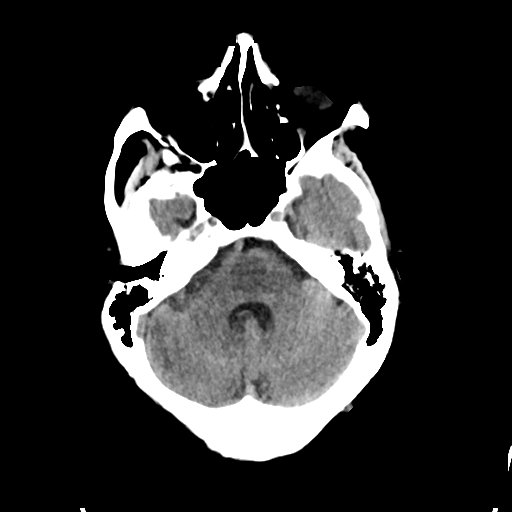
[im 14/37  brain]
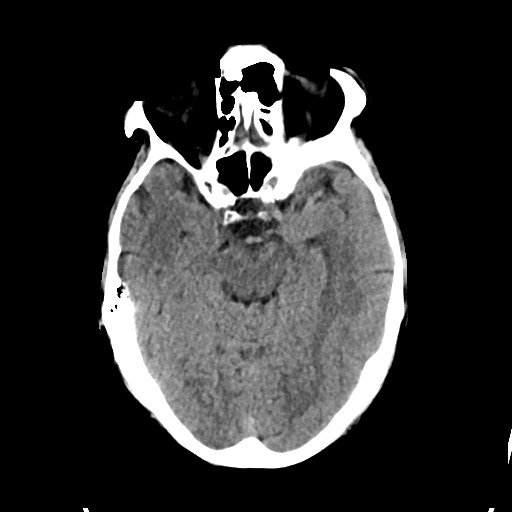
[im 19/37  brain]
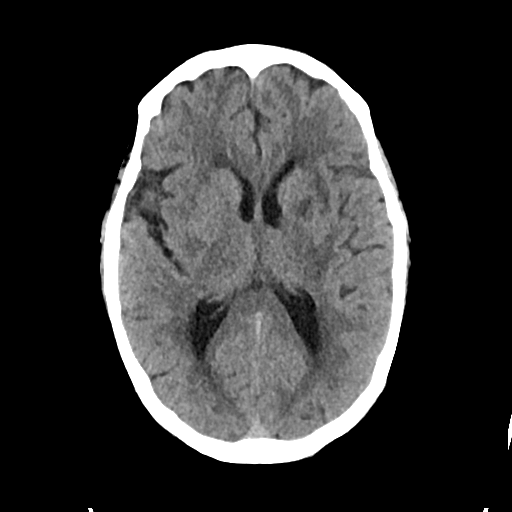
[im 23/37  brain]
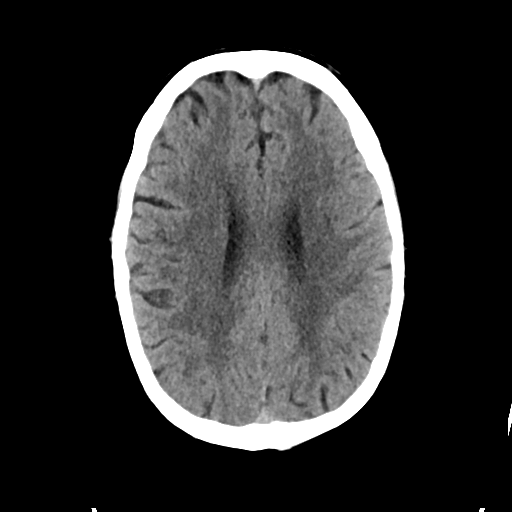
[im 23/37  bone]
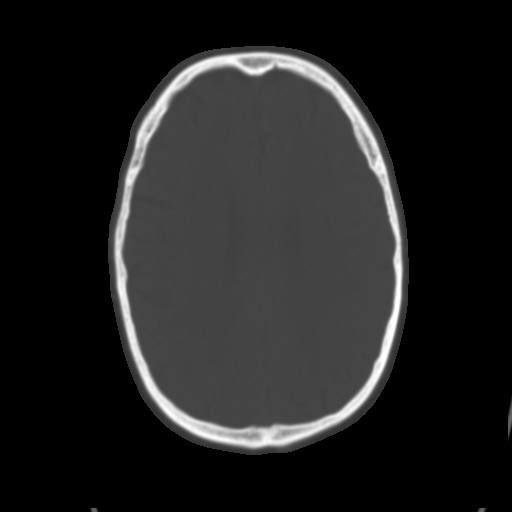
[im 28/37  brain]
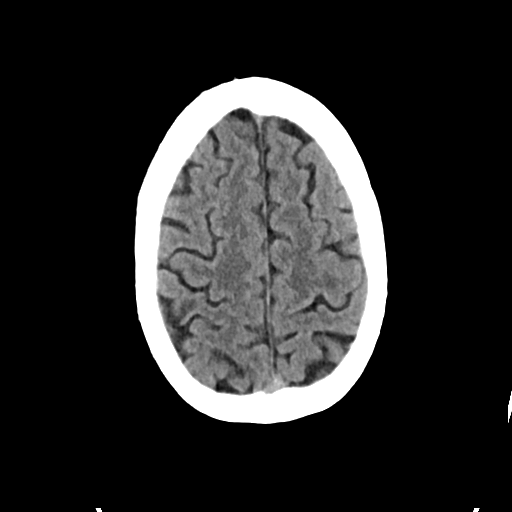
[im 32/37  brain]
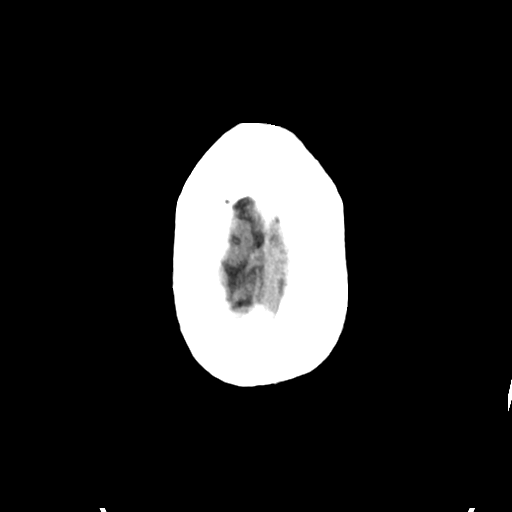

[Series 5: head bone · axial · 0.46mm/px · z∈[-180,-144]mm · 3 of 92 slices shown]
[im 10/92  bone]
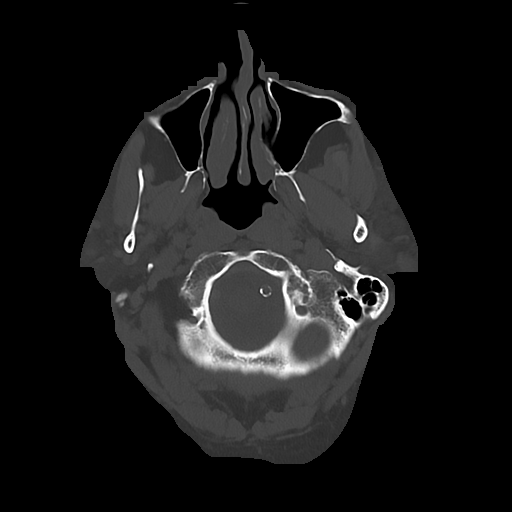
[im 19/92  bone]
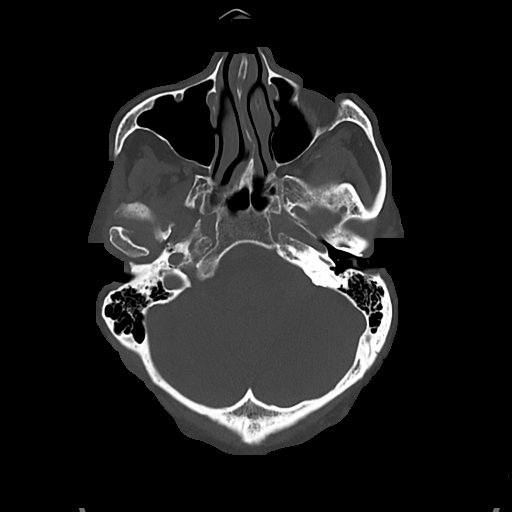
[im 28/92  bone]
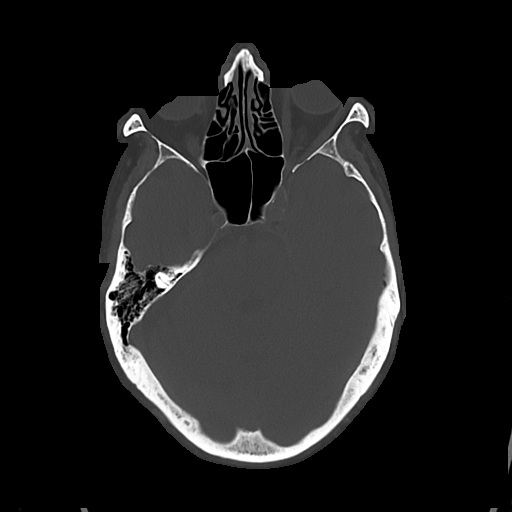

[Series 6: cor soft · coronal · 0.37mm/px · 3 of 77 slices shown]
[im 26/77  brain]
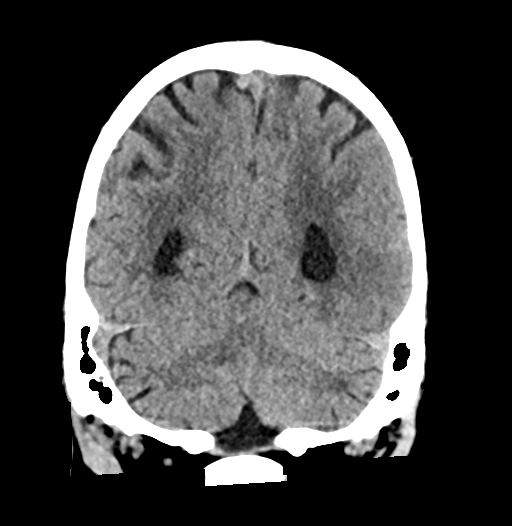
[im 34/77  brain]
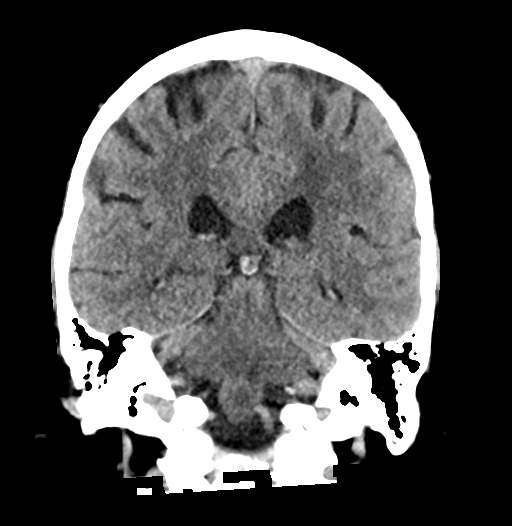
[im 43/77  brain]
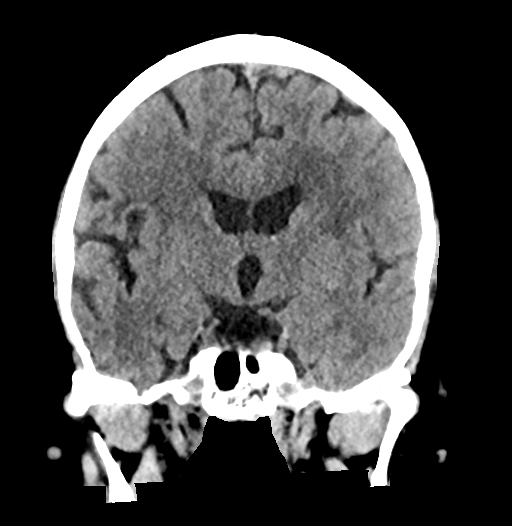

[Series 7: sag soft · sagittal · 0.35mm/px · 3 of 62 slices shown]
[im 21/62  brain]
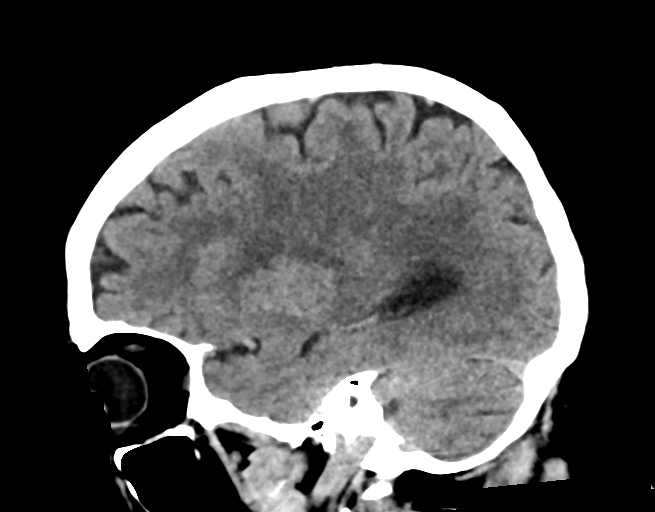
[im 31/62  brain]
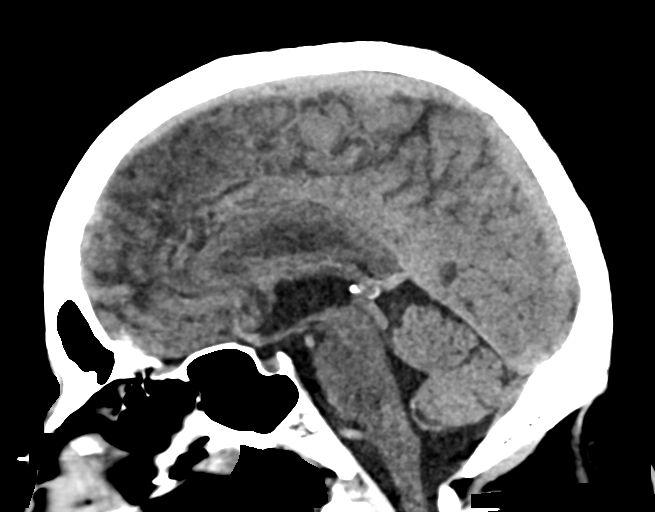
[im 41/62  brain]
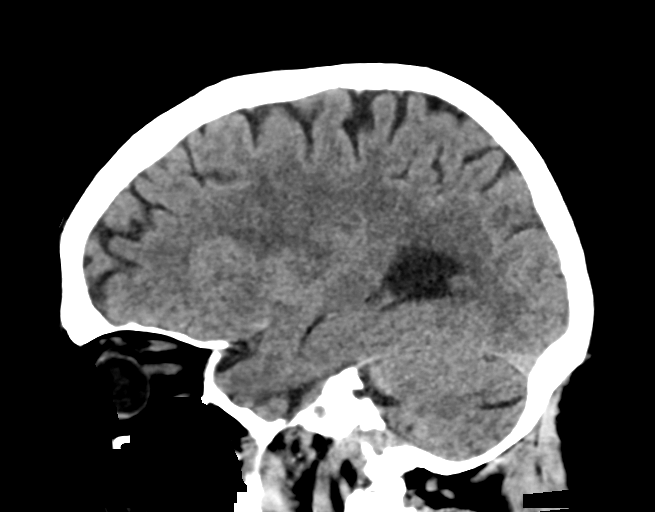

[16 of 47 positions shown; findings below may reference images not displayed]

FINDINGS: CT HEAD FINDINGS

Brain:

Patchy and confluent areas of decreased attenuation are noted
throughout the deep and periventricular white matter of the cerebral
hemispheres bilaterally, compatible with chronic microvascular
ischemic disease.

No evidence of large-territorial acute infarction. No parenchymal
hemorrhage. No mass lesion. No extra-axial collection.

No mass effect or midline shift. No hydrocephalus. Basilar cisterns
are patent.

Vascular: No hyperdense vessel. Atherosclerotic calcifications are
present within the cavernous internal carotid arteries.

Skull: No acute fracture or focal lesion.

Sinuses/Orbits: Paranasal sinuses and mastoid air cells are clear.
The orbits are unremarkable.

Other: None.

CT CERVICAL SPINE FINDINGS

Alignment: Reversal of the normal cervical lordosis centered at the
C6 level. Findings due to severe degenerative changes at the level.
Otherwise normal alignment.

Skull base and vertebrae: Multilevel degenerative changes of the
spine that are most prominent at the C1-C2 and C5 through C7 levels.
No acute fracture. No aggressive appearing focal osseous lesion or
focal pathologic process.

Soft tissues and spinal canal: No prevertebral fluid or swelling. No
visible canal hematoma.

Upper chest: Unremarkable.

Other: None.
IMPRESSION: 1. No acute intracranial abnormality.
2. No acutely displaced fracture or traumatic listhesis of the
cervical spine.

## 2021-01-12 IMAGING — DX DG KNEE COMPLETE 4+V*R*
4 series · 4 of 4 positions shown · non-contrast
Comparison: [DATE]

CLINICAL DATA: Atraumatic right knee pain.

EXAM:
RIGHT KNEE - COMPLETE 4+ VIEW

[knee ap]
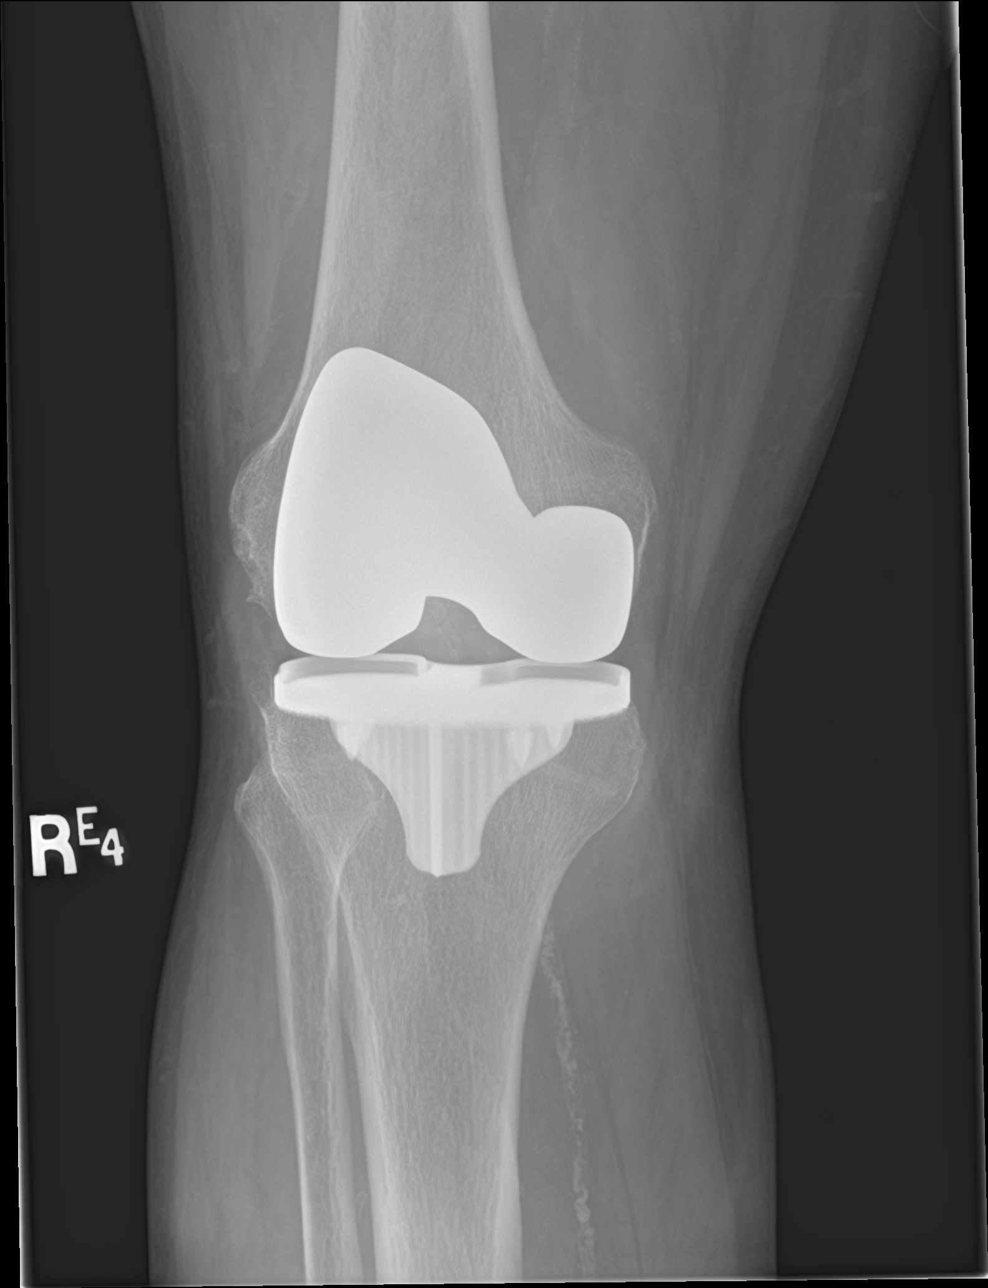

[knee lat]
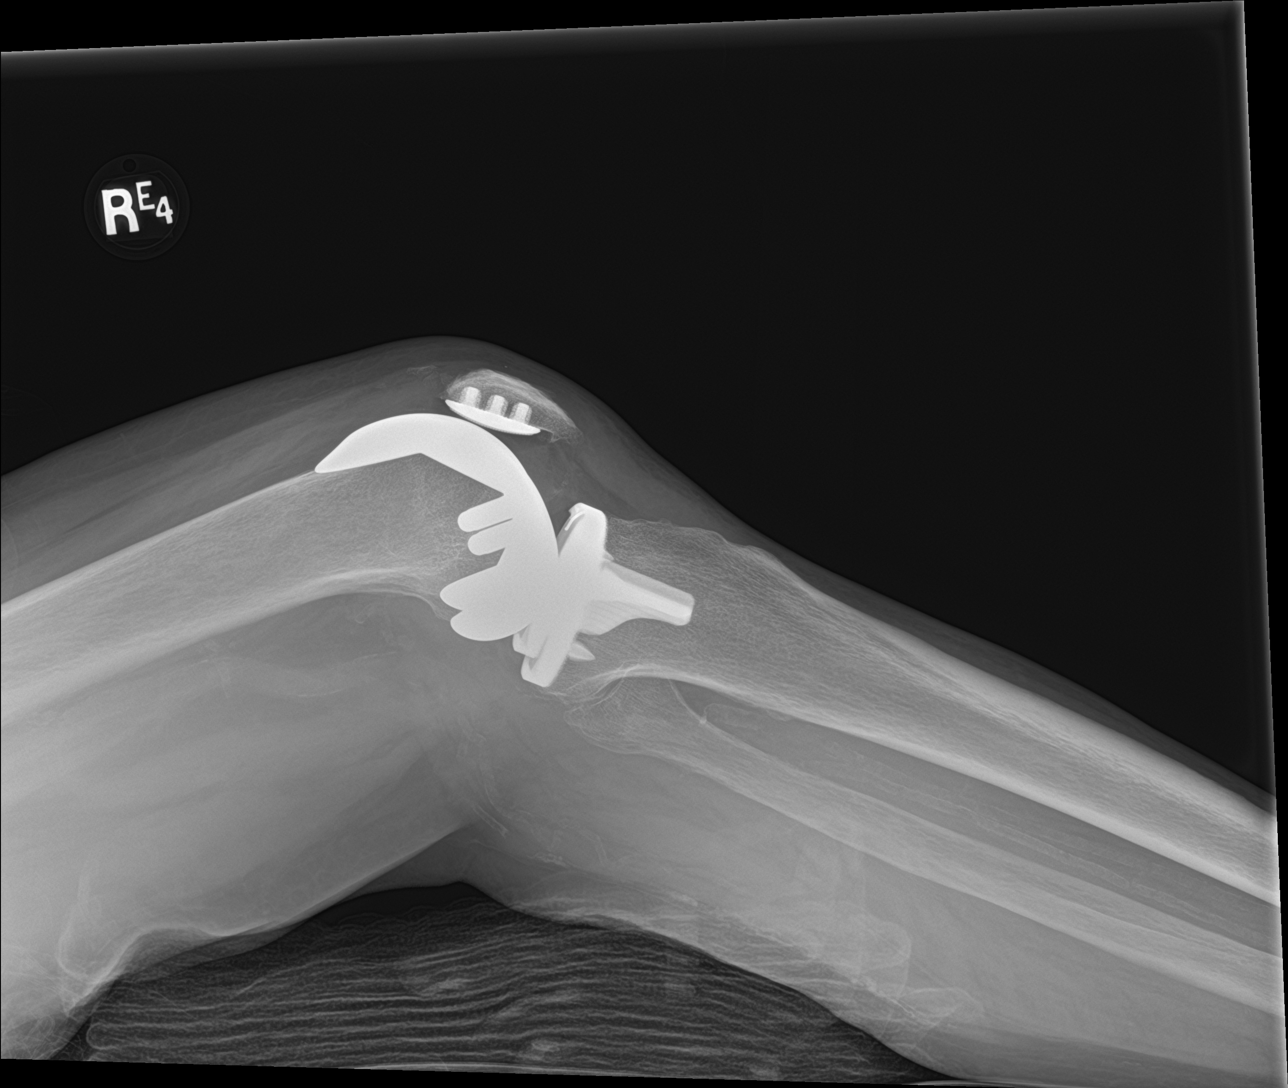

[knee obl (1 of 2)]
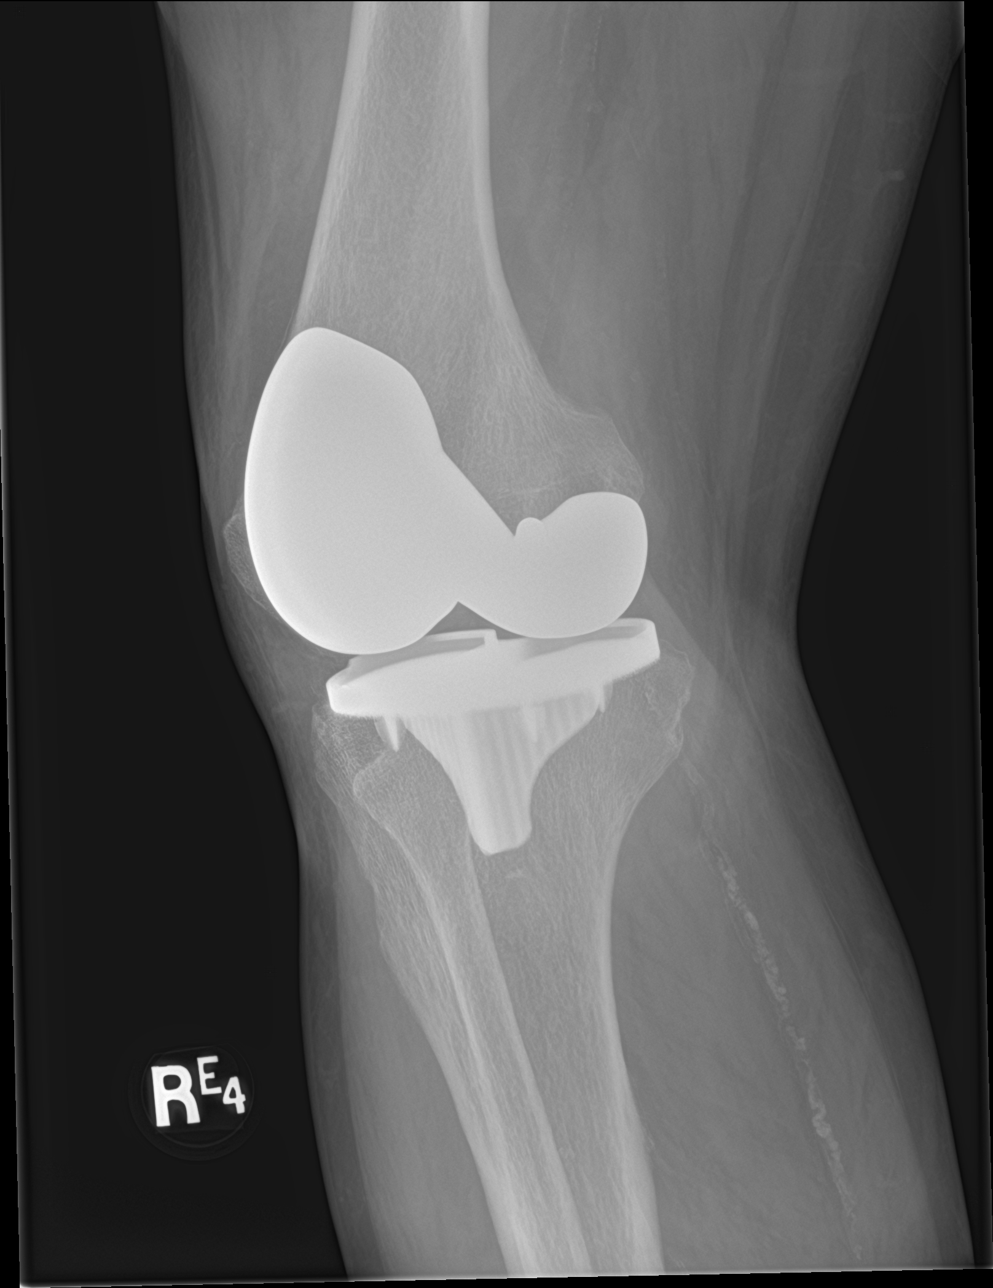

[knee obl (2 of 2)]
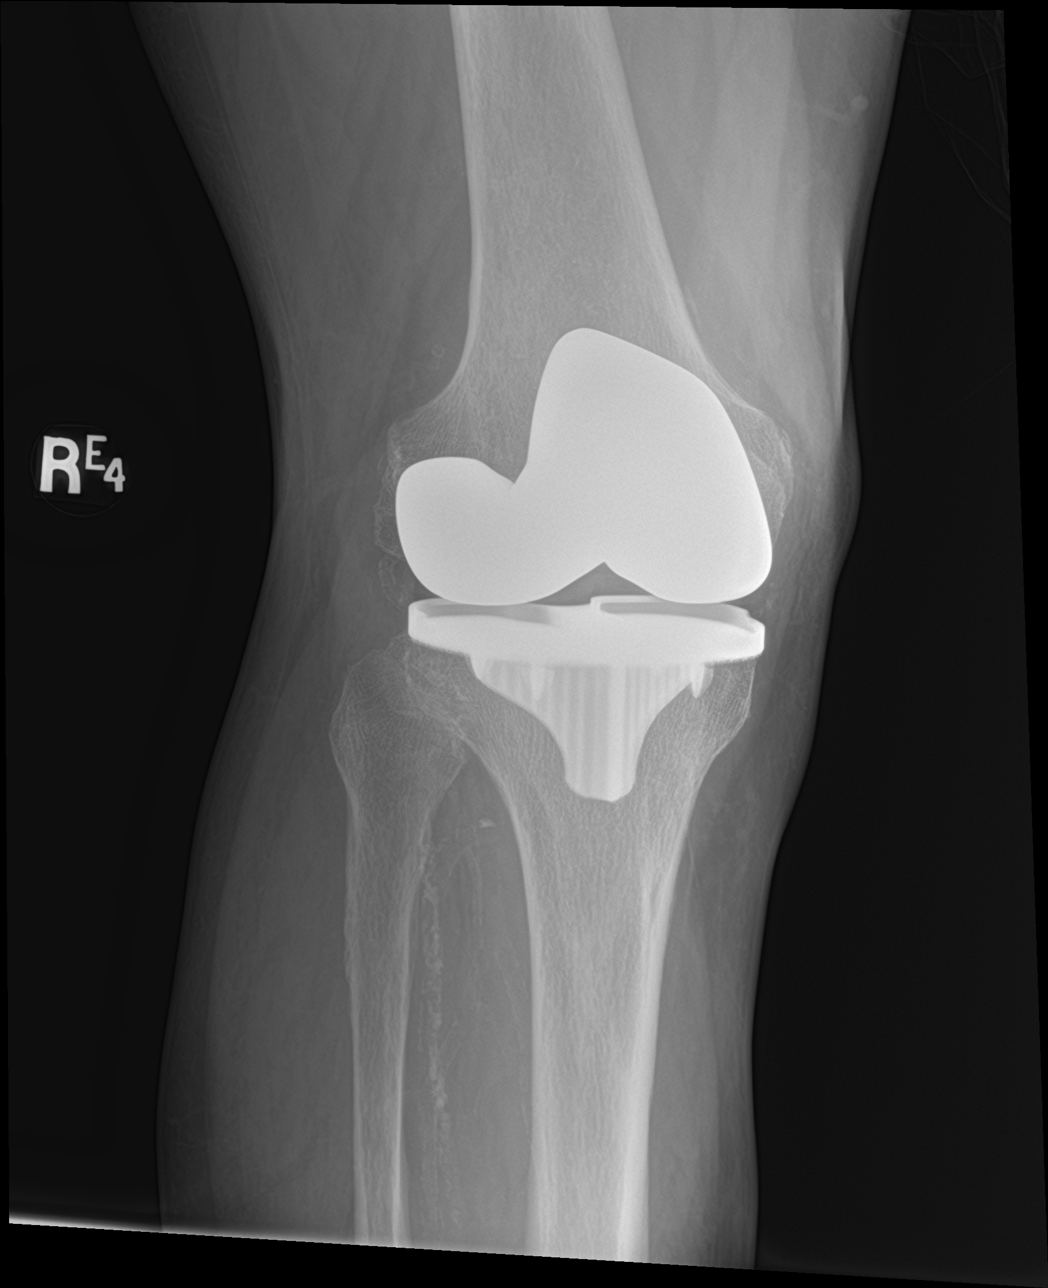

[4 of 4 positions shown; findings below may reference images not displayed]

FINDINGS: A right knee replacement is seen without evidence of surrounding
lucency to suggest the presence of hardware loosening or infection.
No evidence of an acute fracture or dislocation. A small joint
effusion is seen.
IMPRESSION: 1. Right knee replacement without evidence of hardware loosening or
infection.
2. Small joint effusion.

## 2021-01-12 MED ORDER — METOPROLOL TARTRATE 25 MG PO TABS
50.0000 mg | ORAL_TABLET | Freq: Two times a day (BID) | ORAL | Status: DC
  Administered 2021-01-13 – 2021-01-17 (×9): 50 mg via ORAL
  Filled 2021-01-12 (×10): qty 2

## 2021-01-12 MED ORDER — ACETAMINOPHEN 160 MG/5ML PO SOLN: 650.0000 mg | ORAL | Status: DC | PRN

## 2021-01-12 MED ORDER — SODIUM CHLORIDE 0.9 % IV SOLN: INTRAVENOUS | Status: DC

## 2021-01-12 MED ORDER — ASPIRIN EC 81 MG PO TBEC
81.0000 mg | DELAYED_RELEASE_TABLET | Freq: Every day | ORAL | Status: DC
  Administered 2021-01-13 – 2021-01-27 (×15): 81 mg via ORAL
  Filled 2021-01-12 (×15): qty 1

## 2021-01-12 MED ORDER — ENOXAPARIN SODIUM 40 MG/0.4ML ~~LOC~~ SOLN
40.0000 mg | SUBCUTANEOUS | Status: DC
  Administered 2021-01-13 – 2021-01-27 (×15): 40 mg via SUBCUTANEOUS
  Filled 2021-01-12 (×15): qty 0.4

## 2021-01-12 MED ORDER — LORAZEPAM 1 MG PO TABS
1.0000 mg | ORAL_TABLET | Freq: Three times a day (TID) | ORAL | Status: DC | PRN
  Administered 2021-01-13: 1 mg via ORAL
  Filled 2021-01-12: qty 1

## 2021-01-12 MED ORDER — SIMVASTATIN 20 MG PO TABS
40.0000 mg | ORAL_TABLET | Freq: Every day | ORAL | Status: DC
  Administered 2021-01-13 – 2021-01-26 (×12): 40 mg via ORAL
  Filled 2021-01-12 (×14): qty 2

## 2021-01-12 MED ORDER — FUROSEMIDE 20 MG PO TABS
40.0000 mg | ORAL_TABLET | Freq: Two times a day (BID) | ORAL | Status: DC
  Administered 2021-01-13: 40 mg via ORAL
  Filled 2021-01-12: qty 2

## 2021-01-12 MED ORDER — EMPAGLIFLOZIN 25 MG PO TABS
25.0000 mg | ORAL_TABLET | Freq: Every day | ORAL | Status: DC
  Administered 2021-01-13: 25 mg via ORAL
  Filled 2021-01-12: qty 1

## 2021-01-12 MED ORDER — SENNOSIDES-DOCUSATE SODIUM 8.6-50 MG PO TABS
1.0000 | ORAL_TABLET | Freq: Every evening | ORAL | Status: DC | PRN
  Filled 2021-01-12: qty 1

## 2021-01-12 MED ORDER — HYDROCODONE-ACETAMINOPHEN 5-325 MG PO TABS
1.0000 | ORAL_TABLET | Freq: Three times a day (TID) | ORAL | Status: DC | PRN
  Administered 2021-01-13: 1 via ORAL
  Filled 2021-01-12: qty 1

## 2021-01-12 MED ORDER — ISOSORBIDE MONONITRATE ER 30 MG PO TB24
30.0000 mg | ORAL_TABLET | Freq: Every day | ORAL | Status: DC
  Administered 2021-01-13 – 2021-01-27 (×14): 30 mg via ORAL
  Filled 2021-01-12 (×15): qty 1

## 2021-01-12 MED ORDER — INSULIN ASPART 100 UNIT/ML ~~LOC~~ SOLN
0.0000 [IU] | Freq: Three times a day (TID) | SUBCUTANEOUS | Status: DC
  Administered 2021-01-13 (×2): 3 [IU] via SUBCUTANEOUS
  Administered 2021-01-14: 2 [IU] via SUBCUTANEOUS
  Administered 2021-01-14: 3 [IU] via SUBCUTANEOUS
  Administered 2021-01-14 – 2021-01-16 (×5): 2 [IU] via SUBCUTANEOUS
  Administered 2021-01-16 – 2021-01-20 (×12): 3 [IU] via SUBCUTANEOUS
  Administered 2021-01-20 (×2): 2 [IU] via SUBCUTANEOUS
  Administered 2021-01-21 (×2): 3 [IU] via SUBCUTANEOUS
  Administered 2021-01-21: 2 [IU] via SUBCUTANEOUS
  Administered 2021-01-22 – 2021-01-26 (×12): 3 [IU] via SUBCUTANEOUS
  Administered 2021-01-26: 5 [IU] via SUBCUTANEOUS
  Administered 2021-01-26: 2 [IU] via SUBCUTANEOUS
  Administered 2021-01-27: 3 [IU] via SUBCUTANEOUS
  Administered 2021-01-27: 5 [IU] via SUBCUTANEOUS

## 2021-01-12 MED ORDER — CYCLOBENZAPRINE HCL 10 MG PO TABS: 10.0000 mg | ORAL_TABLET | Freq: Three times a day (TID) | ORAL | Status: DC | PRN

## 2021-01-12 MED ORDER — RAMIPRIL 2.5 MG PO CAPS
2.5000 mg | ORAL_CAPSULE | Freq: Every day | ORAL | Status: DC
  Administered 2021-01-13 – 2021-01-14 (×2): 2.5 mg via ORAL
  Filled 2021-01-12 (×3): qty 1

## 2021-01-12 MED ORDER — TRAMADOL HCL 50 MG PO TABS
50.0000 mg | ORAL_TABLET | Freq: Three times a day (TID) | ORAL | Status: DC | PRN
  Administered 2021-01-13: 50 mg via ORAL
  Filled 2021-01-12: qty 1

## 2021-01-12 MED ORDER — SERTRALINE HCL 50 MG PO TABS
50.0000 mg | ORAL_TABLET | Freq: Every day | ORAL | Status: DC
  Administered 2021-01-13: 50 mg via ORAL
  Filled 2021-01-12: qty 1

## 2021-01-12 MED ORDER — STROKE: EARLY STAGES OF RECOVERY BOOK
Freq: Once | Status: DC
  Filled 2021-01-12: qty 1

## 2021-01-12 MED ORDER — ACETAMINOPHEN 325 MG PO TABS
650.0000 mg | ORAL_TABLET | ORAL | Status: DC | PRN
  Administered 2021-01-14 – 2021-01-27 (×8): 650 mg via ORAL
  Filled 2021-01-12 (×9): qty 2

## 2021-01-12 MED ORDER — ACETAMINOPHEN 650 MG RE SUPP: 650.0000 mg | RECTAL | Status: DC | PRN

## 2021-01-12 MED ORDER — LORAZEPAM 2 MG/ML IJ SOLN
2.0000 mg | Freq: Once | INTRAMUSCULAR | Status: AC | PRN
  Administered 2021-01-13: 2 mg via INTRAVENOUS
  Filled 2021-01-12: qty 1

## 2021-01-12 NOTE — ED Notes (Addendum)
Report called to 3W.

## 2021-01-12 NOTE — Progress Notes (Signed)
Patient came to MRI and needed meds in order to complete exams.  Nurse from ED stated that patient was being admitted to 3W20, and that nurse would be administering medication.  Patient could not get medication from floor, and ED nurse would not give medication.  Patient sent to floor, and MRI not done to no one wanted to administer medication.

## 2021-01-12 NOTE — ED Triage Notes (Signed)
PER EMS: Family was taking PT to [facility] for an emergent MRI but PT was too weak to get out of the car. Family called EMS. PT has been having off and on neurological symptoms of slurred speech, weakness, and confusion for one month. Has been seen by neurology, but no diagnosis yet. Family states PT is at his baseline and no new symptoms. EMS CBG = 203, 18g in left AC inserted.

## 2021-01-12 NOTE — ED Provider Notes (Signed)
[facility] EMERGENCY DEPARTMENT Provider Note   CSN: [id number] Arrival date & time: 01/12/21  1520     History Chief Complaint  Patient presents with  . Weakness    Vincent [name] is a 70 y.o. male.  HPI  31 male presents today via EMS with reports of weakness.  Wife is at the bedside and she is the chief historian.  Patient is very hard of hearing and he is difficult to understand when he speaks.  Wife states that 2 weeks ago they were going into the primary care doctor's office for a 54-month checkup when the patient had a syncopal episode.  States he was seen by his doctor at that time and he had some findings that prompted him to order an MRI today.  Wife states in the interim the patient has fallen multiple times.  She attributes this to generalized weakness.  She does not think that he has passed out since the first time.  She describes this as being weak all over and is not able to lateralize the symptoms although she states that at some point it was thought that he had a facial droop.  He has complained of a headache over the past 24 hours.  She states that he points to the right posterior part of his head.  She has not noted any other injuries.  She reports that he has had difficulty speaking clearly.  She denies fever, chills, nausea, vomiting, diarrhea, change in eating, urinating, or defecating.     Past Medical History:  Diagnosis Date  . Acute MI ([place])   . Allergy   . Anxiety   . Arthritis   . Coronary artery disease   . Depression   . Diabetes mellitus without complication ([place])   . Diverticulosis    mild  . Hemorrhoids   . HOH (hard of hearing)   . Hx of colonic polyps   . Hyperlipidemia   . Hypertension   . Left ventricular dysfunction    hx of    Patient Active Problem List   Diagnosis Date Noted  . Precordial pain   . Right knee pain 01/07/2018  . History of total right knee replacement 01/07/2018  . SOB (shortness of breath)  08/12/2016  . Tachycardia 08/12/2016  . Low grade fever 08/12/2016  . Osteoarthritis of right knee 08/09/2016  . OSA (obstructive sleep apnea) 11/29/2011  . Syncope 07/24/2011  . ARM NUMBNESS 06/19/2010  . EDEMA 03/13/2010  . FASTING HYPERGLYCEMIA 03/13/2010  . CORONARY ATHEROSCLEROSIS NATIVE CORONARY ARTERY 06/20/2009  . COLONIC POLYPS, ADENOMATOUS 01/31/2009  . HLD (hyperlipidemia) 01/31/2009  . Hypertension 01/31/2009  . CAD, UNSPECIFIED SITE 01/31/2009  . HEMORRHOIDS 01/31/2009  . DIVERTICULOSIS, MILD 01/31/2009    Past Surgical History:  Procedure Laterality Date  . ANGIOPLASTY     and bare metal stent placement  . ANKLE SURGERY Right 1986   i&d abscess- pt states "put a bone in it"  . CORONARY ANGIOPLASTY WITH STENT PLACEMENT    . KNEE ARTHROPLASTY Right 08/09/2016   Procedure: RIGHT TOTAL KNEE ARTHROPLASTY WITH COMPUTER NAVIGATION;  Surgeon: Rod Can, MD;  Location: [facility];  Service: Orthopedics;  Laterality: Right;  Needs RNFA  . LEFT HEART CATH AND CORONARY ANGIOGRAPHY N/A 11/21/2018   Procedure: LEFT HEART CATH AND CORONARY ANGIOGRAPHY;  Surgeon: Burnell Blanks, MD;  Location: [facility];  Service: Cardiovascular;  Laterality: N/A;       Family History  Problem Relation Age  of Onset  . Coronary artery disease Other        positive family hx of  . Colon cancer Neg Hx   . Esophageal cancer Neg Hx   . Liver cancer Neg Hx   . Pancreatic cancer Neg Hx   . Rectal cancer Neg Hx   . Stomach cancer Neg Hx     Social History   Tobacco Use  . Smoking status: Former Smoker    Types: Cigars    Quit date: 12/17/2005    Years since quitting: 15.0  . Smokeless tobacco: Former Systems developer    Quit date: 12/17/2005  Vaping Use  . Vaping Use: Never used  Substance Use Topics  . Alcohol use: No  . Drug use: No    Home Medications Prior to Admission medications   Medication Sig Start Date End Date Taking? Authorizing Provider  aspirin EC 81 MG tablet  Take 1 tablet (81 mg total) by mouth daily. 11/04/17   Lyda Jester M, PA-C  cyclobenzaprine (FLEXERIL) 10 MG tablet Take 10 mg by mouth every 8 (eight) hours as needed for muscle spasms. 10/13/18   [provider]  furosemide (LASIX) 40 MG tablet TAKE 1 TABLET BY MOUTH TWICE A DAY 09/21/20   Burnell Blanks, MD  HYDROcodone-acetaminophen (NORCO/VICODIN) 5-325 MG tablet Take 1 tablet by mouth every 8 (eight) hours as needed for moderate pain.  01/03/18   [provider]  isosorbide mononitrate (IMDUR) 30 MG 24 hr tablet Take 1 tablet (30 mg total) by mouth daily. 08/02/20   Burnell Blanks, MD  JARDIANCE 25 MG TABS tablet Take 25 mg by mouth daily. 10/31/17   [provider]  LORazepam (ATIVAN) 1 MG tablet Take 1 mg by mouth every 8 (eight) hours as needed for anxiety.  06/22/16   [provider]  meloxicam (MOBIC) 7.5 MG tablet Take 1 tablet (7.5 mg total) by mouth daily. Patient taking differently: Take 7.5 mg by mouth 2 (two) times daily as needed for pain.  03/20/14   Palumbo, April, MD  metFORMIN (GLUCOPHAGE) 1000 MG tablet Take 1,000 mg by mouth 2 (two) times daily.    [provider]  metoprolol tartrate (LOPRESSOR) 50 MG tablet TAKE 1 TABLET BY MOUTH TWICE A DAY 09/21/20   Burnell Blanks, MD  nitroGLYCERIN (NITROSTAT) 0.4 MG SL tablet Place 1 tablet (0.4 mg total) under the tongue every 5 (five) minutes as needed for chest pain. 11/04/17   Lyda Jester M, PA-C  ramipril (ALTACE) 2.5 MG capsule Take 1 capsule (2.5 mg total) by mouth daily. 08/12/20   Burnell Blanks, MD  sertraline (ZOLOFT) 50 MG tablet Take 50 mg by mouth daily. 02/14/19   [provider]  simvastatin (ZOCOR) 40 MG tablet Take 1 tablet (40 mg total) by mouth at bedtime. 11/04/17   Lyda Jester M, PA-C  traMADol (ULTRAM) 50 MG tablet Take 50 mg by mouth every 8 (eight) hours as needed for moderate pain.     [provider]     Allergies    Penicillins  Review of Systems   Review of Systems  All other systems reviewed and are negative.   Physical Exam Updated Vital Signs BP 120/73 (BP Location: Right Arm)   Pulse 84   Temp 98 F (36.7 C) (Oral)   Resp 20   SpO2 98%   Physical Exam Vitals and nursing note reviewed.  Constitutional:      General: He is not in acute distress.  Appearance: Normal appearance. He is not ill-appearing.  HENT:     Head: Normocephalic and atraumatic.     Right Ear: External ear normal.     Left Ear: External ear normal.     Nose: Nose normal.     Mouth/Throat:     Mouth: Mucous membranes are dry.     Comments: Tongue with mild deviation to right Mouth difficult to evaluate due to bushy mustache and beard Eyes:     Extraocular Movements: Extraocular movements intact.     Conjunctiva/sclera: Conjunctivae normal.     Pupils: Pupils are equal, round, and reactive to light.  Cardiovascular:     Rate and Rhythm: Normal rate and regular rhythm.     Pulses: Normal pulses.     Heart sounds: Normal heart sounds.  Pulmonary:     Effort: Pulmonary effort is normal.     Breath sounds: Normal breath sounds.  Abdominal:     General: Bowel sounds are normal. There is no distension.     Palpations: Abdomen is soft.     Tenderness: There is no abdominal tenderness.  Musculoskeletal:     Cervical back: Normal range of motion and neck supple.     Comments: Patient with normal active range of motion bilateral upper extremities Decreased movement of right lower extremity Able to hold left lower extremity up against gravity  Skin:    General: Skin is warm and dry.     Capillary Refill: Capillary refill takes less than 2 seconds.  Neurological:     Mental Status: He is alert.     Cranial Nerves: No cranial nerve deficit.     Motor: Weakness present.     Coordination: Coordination abnormal.     Comments: Patient with bilateral upper extremity weakness but no palmar  drift Facies appear equal bilaterally with the exception of some tongue deviation to right Patient has bilateral upper extremity weakness Right lower extremity is weaker than left and he is unable to lift leg off bed against gravity Speech is difficult to understand No visual field deficits are noted  Psychiatric:        Mood and Affect: Mood normal.     ED Results / Procedures / Treatments   Labs (all labs ordered are listed, but only abnormal results are displayed) Labs Reviewed  CBC  COMPREHENSIVE METABOLIC PANEL  URINALYSIS, ROUTINE W REFLEX MICROSCOPIC    EKG EKG Interpretation  Date/Time:  Thursday January 12 2021 16:39:43 EDT Ventricular Rate:  76 PR Interval:  176 QRS Duration: 110 QT Interval:  399 QTC Calculation: 449 R Axis:   1 Text Interpretation: Incomplete analysis due to missing data in precordial lead(s) Sinus rhythm Borderline T wave abnormalities Missing lead(s): V1 Confirmed by Pattricia Boss [phone] on 01/12/2021 4:42:47 PM   Radiology CT Head Wo Contrast  Result Date: 01/12/2021 CLINICAL DATA:  Slurred speech, weakness, confusion for a month. EXAM: CT HEAD WITHOUT CONTRAST CT CERVICAL SPINE WITHOUT CONTRAST TECHNIQUE: Multidetector CT imaging of the head and cervical spine was performed following the standard protocol without intravenous contrast. Multiplanar CT image reconstructions of the cervical spine were also generated. COMPARISON:  None. FINDINGS: CT HEAD FINDINGS Brain: Patchy and confluent areas of decreased attenuation are noted throughout the deep and periventricular white matter of the cerebral hemispheres bilaterally, compatible with chronic microvascular ischemic disease. No evidence of large-territorial acute infarction. No parenchymal hemorrhage. No mass lesion. No extra-axial collection. No mass effect or midline shift. No hydrocephalus. Basilar cisterns are patent.  Vascular: No hyperdense vessel. Atherosclerotic calcifications are present within  the cavernous internal carotid arteries. Skull: No acute fracture or focal lesion. Sinuses/Orbits: Paranasal sinuses and mastoid air cells are clear. The orbits are unremarkable. Other: None. CT CERVICAL SPINE FINDINGS Alignment: Reversal of the normal cervical lordosis centered at the C6 level. Findings due to severe degenerative changes at the level. Otherwise normal alignment. Skull base and vertebrae: Multilevel degenerative changes of the spine that are most prominent at the C1-C2 and C5 through C7 levels. No acute fracture. No aggressive appearing focal osseous lesion or focal pathologic process. Soft tissues and spinal canal: No prevertebral fluid or swelling. No visible canal hematoma. Upper chest: Unremarkable. Other: None. IMPRESSION: 1. No acute intracranial abnormality. 2. No acutely displaced fracture or traumatic listhesis of the cervical spine. Electronically Signed   By: Iven Finn M.D.   On: 01/12/2021 16:37   CT Cervical Spine Wo Contrast  Result Date: 01/12/2021 CLINICAL DATA:  Slurred speech, weakness, confusion for a month. EXAM: CT HEAD WITHOUT CONTRAST CT CERVICAL SPINE WITHOUT CONTRAST TECHNIQUE: Multidetector CT imaging of the head and cervical spine was performed following the standard protocol without intravenous contrast. Multiplanar CT image reconstructions of the cervical spine were also generated. COMPARISON:  None. FINDINGS: CT HEAD FINDINGS Brain: Patchy and confluent areas of decreased attenuation are noted throughout the deep and periventricular white matter of the cerebral hemispheres bilaterally, compatible with chronic microvascular ischemic disease. No evidence of large-territorial acute infarction. No parenchymal hemorrhage. No mass lesion. No extra-axial collection. No mass effect or midline shift. No hydrocephalus. Basilar cisterns are patent. Vascular: No hyperdense vessel. Atherosclerotic calcifications are present within the cavernous internal carotid arteries.  Skull: No acute fracture or focal lesion. Sinuses/Orbits: Paranasal sinuses and mastoid air cells are clear. The orbits are unremarkable. Other: None. CT CERVICAL SPINE FINDINGS Alignment: Reversal of the normal cervical lordosis centered at the C6 level. Findings due to severe degenerative changes at the level. Otherwise normal alignment. Skull base and vertebrae: Multilevel degenerative changes of the spine that are most prominent at the C1-C2 and C5 through C7 levels. No acute fracture. No aggressive appearing focal osseous lesion or focal pathologic process. Soft tissues and spinal canal: No prevertebral fluid or swelling. No visible canal hematoma. Upper chest: Unremarkable. Other: None. IMPRESSION: 1. No acute intracranial abnormality. 2. No acutely displaced fracture or traumatic listhesis of the cervical spine. Electronically Signed   By: Iven Finn M.D.   On: 01/12/2021 16:37    Procedures Procedures   Medications Ordered in ED Medications - No data to display  ED Course  I have reviewed the triage vital signs and the nursing notes.  Pertinent labs & imaging results that were available during my care of the patient were reviewed by me and considered in my medical decision making (see chart for details).  Clinical Course as of 01/12/21 1745  Thu Jan 12, 2021  1735 Labs and CT reviewed [DR]  1735 No definitive cause of weakness noted I personally reviewed the CT scan NO definitive cause of weakness noted. Plan neurology consult [DR]    Clinical Course User Index [DR] Pattricia Boss, MD   MDM Rules/Calculators/A&P                         70 yo male with new onset of weakness, difficulty speaking.  Patient with exam with right sided > left sided weakness- unable to lift right lower extremity off bed.  Metabolic  work up without acute changes to explain weakness- glucose, sodium, potassium normal. CT head without bleed or subacute infarct. Patient will need further evaluation with  mri, monitoring and PT  Discussed with neurology who agrees with MRI of head and neck- thoracic and lumbar added as lower extremity worse without hard findings upper extremities. Patient care discussed with Dr. Roosevelt Locks and he will see for admission.  Final Clinical Impression(s) / ED Diagnoses Final diagnoses:  Weakness    Rx / DC Orders ED Discharge Orders    None       Pattricia Boss, MD 01/12/21 1816

## 2021-01-12 NOTE — ED Notes (Signed)
Tried calling report, no answer

## 2021-01-12 NOTE — H&P (Signed)
History and Physical    Vincent [name] DOB: 12-25-1950 DOA: 01/12/2021  PCP: Ginger Organ., MD (Confirm with patient/family/NH records and if not entered, this has to be entered at [facility] point of entry) Patient coming from: Home  I have personally briefly reviewed patient's old medical records in [place]  Chief Complaint: Multiple complaints  HPI: Vincent [name] is a 70 y.o. male with medical history significant of IIDM, HTN, CAD, chronic systolic CHF (LVEF 45 to 71% 2020 ), severe OA on right knee status post TKR, chronic pain syndrome on narcotics, presented with frequent falls, slurred speech, periotic confusion, bilateral leg weakness and visual hallucination.  Patient confused, most history given by patient's wife at bedside.  Symptoms started 2 weeks ago, patient used to use cane for ambulation, but has been experiencing frequent dizziness and bilateral leg weakness and multiple fall episodes.  Denied any loss of consciousness or head injuries.  Patient is also experienced visual hallucinations such as " 4 deers in the room, a couple of rabbit running around" and patient became very confused for last 2 days sometimes can even recognize his wife.  Also in the last 2 days, patient speech has been slurred and slow.  His PCP ordered MRI but not done yet. ED Course: Vital signs stable, CT head negative for acute findings.  CBC BMP largely within normal limits.  Review of Systems: As per HPI otherwise 14 point review of systems negative.    Past Medical History:  Diagnosis Date  . Acute MI ([place])   . Allergy   . Anxiety   . Arthritis   . Coronary artery disease   . Depression   . Diabetes mellitus without complication ([place])   . Diverticulosis    mild  . Hemorrhoids   . HOH (hard of hearing)   . Hx of colonic polyps   . Hyperlipidemia   . Hypertension   . Left ventricular dysfunction    hx of    Past Surgical History:  Procedure Laterality Date  .  ANGIOPLASTY     and bare metal stent placement  . ANKLE SURGERY Right 1986   i&d abscess- pt states "put a bone in it"  . CORONARY ANGIOPLASTY WITH STENT PLACEMENT    . KNEE ARTHROPLASTY Right 08/09/2016   Procedure: RIGHT TOTAL KNEE ARTHROPLASTY WITH COMPUTER NAVIGATION;  Surgeon: Rod Can, MD;  Location: [facility];  Service: Orthopedics;  Laterality: Right;  Needs RNFA  . LEFT HEART CATH AND CORONARY ANGIOGRAPHY N/A 11/21/2018   Procedure: LEFT HEART CATH AND CORONARY ANGIOGRAPHY;  Surgeon: Burnell Blanks, MD;  Location: [facility];  Service: Cardiovascular;  Laterality: N/A;     reports that he quit smoking about 15 years ago. His smoking use included cigars. He quit smokeless tobacco use about 15 years ago. He reports that he does not drink alcohol and does not use drugs.  Allergies  Allergen Reactions  . Penicillins Other (See Comments)    Has patient had a PCN reaction causing immediate rash, facial/tongue/throat swelling, SOB or lightheadedness with hypotension: Unknown Has patient had a PCN reaction causing severe rash involving mucus membranes or skin necrosis: Unknown Has patient had a PCN reaction that required hospitalization: Unknown Has patient had a PCN reaction occurring within the last 10 years: No If all of the above answers are "NO", then may proceed with Cephalosporin use.     Family History  Problem Relation Age of Onset  .  Coronary artery disease Other        positive family hx of  . Colon cancer Neg Hx   . Esophageal cancer Neg Hx   . Liver cancer Neg Hx   . Pancreatic cancer Neg Hx   . Rectal cancer Neg Hx   . Stomach cancer Neg Hx      Prior to Admission medications   Medication Sig Start Date End Date Taking? Authorizing Provider  aspirin EC 81 MG tablet Take 1 tablet (81 mg total) by mouth daily. 11/04/17   Lyda Jester M, PA-C  cyclobenzaprine (FLEXERIL) 10 MG tablet Take 10 mg by mouth every 8 (eight) hours as needed for  muscle spasms. 10/13/18   [provider]  furosemide (LASIX) 40 MG tablet TAKE 1 TABLET BY MOUTH TWICE A DAY 09/21/20   Burnell Blanks, MD  HYDROcodone-acetaminophen (NORCO/VICODIN) 5-325 MG tablet Take 1 tablet by mouth every 8 (eight) hours as needed for moderate pain.  01/03/18   [provider]  isosorbide mononitrate (IMDUR) 30 MG 24 hr tablet Take 1 tablet (30 mg total) by mouth daily. 08/02/20   Burnell Blanks, MD  JARDIANCE 25 MG TABS tablet Take 25 mg by mouth daily. 10/31/17   [provider]  LORazepam (ATIVAN) 1 MG tablet Take 1 mg by mouth every 8 (eight) hours as needed for anxiety.  06/22/16   [provider]  meloxicam (MOBIC) 7.5 MG tablet Take 1 tablet (7.5 mg total) by mouth daily. Patient taking differently: Take 7.5 mg by mouth 2 (two) times daily as needed for pain.  03/20/14   Palumbo, April, MD  metFORMIN (GLUCOPHAGE) 1000 MG tablet Take 1,000 mg by mouth 2 (two) times daily.    [provider]  metoprolol tartrate (LOPRESSOR) 50 MG tablet TAKE 1 TABLET BY MOUTH TWICE A DAY 09/21/20   Burnell Blanks, MD  nitroGLYCERIN (NITROSTAT) 0.4 MG SL tablet Place 1 tablet (0.4 mg total) under the tongue every 5 (five) minutes as needed for chest pain. 11/04/17   Lyda Jester M, PA-C  ramipril (ALTACE) 2.5 MG capsule Take 1 capsule (2.5 mg total) by mouth daily. 08/12/20   Burnell Blanks, MD  sertraline (ZOLOFT) 50 MG tablet Take 50 mg by mouth daily. 02/14/19   [provider]  simvastatin (ZOCOR) 40 MG tablet Take 1 tablet (40 mg total) by mouth at bedtime. 11/04/17   Lyda Jester M, PA-C  traMADol (ULTRAM) 50 MG tablet Take 50 mg by mouth every 8 (eight) hours as needed for moderate pain.     [provider]    Physical Exam: Vitals:   01/12/21 1631 01/12/21 1701 01/12/21 1730 01/12/21 1815  BP: 113/68 112/73 120/76 128/73  Pulse: 77 81 76 79  Resp: 20 19 20 14   Temp:       TempSrc:      SpO2: 95% 94% 92% 95%    Constitutional: NAD, calm, comfortable Vitals:   01/12/21 1631 01/12/21 1701 01/12/21 1730 01/12/21 1815  BP: 113/68 112/73 120/76 128/73  Pulse: 77 81 76 79  Resp: 20 19 20 14   Temp:      TempSrc:      SpO2: 95% 94% 92% 95%   Eyes: PERRL, lids and conjunctivae normal ENMT: Mucous membranes are dryt. Posterior pharynx clear of any exudate or lesions.Normal dentition.  Neck: normal, supple, no masses, no thyromegaly Respiratory: clear to auscultation bilaterally, no wheezing, no crackles. Normal respiratory effort. No accessory muscle use.  Cardiovascular:  Regular rate and rhythm, no murmurs / rubs / gallops. No extremity edema. 2+ pedal pulses. No carotid bruits.  Abdomen: no tenderness, no masses palpated. No hepatosplenomegaly. Bowel sounds positive.  Musculoskeletal: no clubbing / cyanosis. No joint deformity upper and lower extremities.  Decreased range of motion of right knee with severe pain and tenderness when flexing >90 degree, no contractures. Normal muscle tone.  Skin: no rashes, lesions, ulcers. No induration Neurologic: CN 2-12 grossly intact. Sensation intact, DTR normal. Strength 4/5 in right leg compared to 5/5 on the left.  Psychiatric: Awake alert oriented to himself, confused about time and place. Normal mood.    Labs on Admission: I have personally reviewed following labs and imaging studies  CBC: Recent Labs  Lab 01/12/21 1548  WBC 9.6  HGB 14.2  HCT 42.2  MCV 91.7  PLT 353   Basic Metabolic Panel: Recent Labs  Lab 01/12/21 1548  NA 136  K 4.2  CL 101  CO2 28  GLUCOSE 125*  BUN 9  CREATININE 0.96  CALCIUM 9.4   GFR: CrCl cannot be calculated (Unknown ideal weight.). Liver Function Tests: Recent Labs  Lab 01/12/21 1548  AST 16  ALT 13  ALKPHOS 96  BILITOT 0.7  PROT 6.1*  ALBUMIN 3.8   No results for input(s): LIPASE, AMYLASE in the last 168 hours. No results for input(s): AMMONIA in the last  168 hours. Coagulation Profile: No results for input(s): INR, PROTIME in the last 168 hours. Cardiac Enzymes: No results for input(s): CKTOTAL, CKMB, CKMBINDEX, TROPONINI in the last 168 hours. BNP (last 3 results) No results for input(s): PROBNP in the last 8760 hours. HbA1C: No results for input(s): HGBA1C in the last 72 hours. CBG: No results for input(s): GLUCAP in the last 168 hours. Lipid Profile: No results for input(s): CHOL, HDL, LDLCALC, TRIG, CHOLHDL, LDLDIRECT in the last 72 hours. Thyroid Function Tests: No results for input(s): TSH, T4TOTAL, FREET4, T3FREE, THYROIDAB in the last 72 hours. Anemia Panel: No results for input(s): VITAMINB12, FOLATE, FERRITIN, TIBC, IRON, RETICCTPCT in the last 72 hours. Urine analysis:    Component Value Date/Time   COLORURINE YELLOW 08/12/2016 1918   APPEARANCEUR CLEAR 08/12/2016 1918   LABSPEC 1.022 08/12/2016 1918   PHURINE 6.0 08/12/2016 1918   GLUCOSEU NEGATIVE 08/12/2016 1918   HGBUR NEGATIVE 08/12/2016 1918   BILIRUBINUR NEGATIVE 08/12/2016 1918   KETONESUR NEGATIVE 08/12/2016 1918   PROTEINUR NEGATIVE 08/12/2016 1918   UROBILINOGEN 0.2 01/06/2014 1257   NITRITE NEGATIVE 08/12/2016 1918   LEUKOCYTESUR NEGATIVE 08/12/2016 1918    Radiological Exams on Admission: CT Head Wo Contrast  Result Date: 01/12/2021 CLINICAL DATA:  Slurred speech, weakness, confusion for a month. EXAM: CT HEAD WITHOUT CONTRAST CT CERVICAL SPINE WITHOUT CONTRAST TECHNIQUE: Multidetector CT imaging of the head and cervical spine was performed following the standard protocol without intravenous contrast. Multiplanar CT image reconstructions of the cervical spine were also generated. COMPARISON:  None. FINDINGS: CT HEAD FINDINGS Brain: Patchy and confluent areas of decreased attenuation are noted throughout the deep and periventricular white matter of the cerebral hemispheres bilaterally, compatible with chronic microvascular ischemic disease. No evidence of  large-territorial acute infarction. No parenchymal hemorrhage. No mass lesion. No extra-axial collection. No mass effect or midline shift. No hydrocephalus. Basilar cisterns are patent. Vascular: No hyperdense vessel. Atherosclerotic calcifications are present within the cavernous internal carotid arteries. Skull: No acute fracture or focal lesion. Sinuses/Orbits: Paranasal sinuses and mastoid air cells are clear. The orbits are unremarkable. Other:  None. CT CERVICAL SPINE FINDINGS Alignment: Reversal of the normal cervical lordosis centered at the C6 level. Findings due to severe degenerative changes at the level. Otherwise normal alignment. Skull base and vertebrae: Multilevel degenerative changes of the spine that are most prominent at the C1-C2 and C5 through C7 levels. No acute fracture. No aggressive appearing focal osseous lesion or focal pathologic process. Soft tissues and spinal canal: No prevertebral fluid or swelling. No visible canal hematoma. Upper chest: Unremarkable. Other: None. IMPRESSION: 1. No acute intracranial abnormality. 2. No acutely displaced fracture or traumatic listhesis of the cervical spine. Electronically Signed   By: Iven Finn M.D.   On: 01/12/2021 16:37   CT Cervical Spine Wo Contrast  Result Date: 01/12/2021 CLINICAL DATA:  Slurred speech, weakness, confusion for a month. EXAM: CT HEAD WITHOUT CONTRAST CT CERVICAL SPINE WITHOUT CONTRAST TECHNIQUE: Multidetector CT imaging of the head and cervical spine was performed following the standard protocol without intravenous contrast. Multiplanar CT image reconstructions of the cervical spine were also generated. COMPARISON:  None. FINDINGS: CT HEAD FINDINGS Brain: Patchy and confluent areas of decreased attenuation are noted throughout the deep and periventricular white matter of the cerebral hemispheres bilaterally, compatible with chronic microvascular ischemic disease. No evidence of large-territorial acute infarction. No  parenchymal hemorrhage. No mass lesion. No extra-axial collection. No mass effect or midline shift. No hydrocephalus. Basilar cisterns are patent. Vascular: No hyperdense vessel. Atherosclerotic calcifications are present within the cavernous internal carotid arteries. Skull: No acute fracture or focal lesion. Sinuses/Orbits: Paranasal sinuses and mastoid air cells are clear. The orbits are unremarkable. Other: None. CT CERVICAL SPINE FINDINGS Alignment: Reversal of the normal cervical lordosis centered at the C6 level. Findings due to severe degenerative changes at the level. Otherwise normal alignment. Skull base and vertebrae: Multilevel degenerative changes of the spine that are most prominent at the C1-C2 and C5 through C7 levels. No acute fracture. No aggressive appearing focal osseous lesion or focal pathologic process. Soft tissues and spinal canal: No prevertebral fluid or swelling. No visible canal hematoma. Upper chest: Unremarkable. Other: None. IMPRESSION: 1. No acute intracranial abnormality. 2. No acutely displaced fracture or traumatic listhesis of the cervical spine. Electronically Signed   By: Iven Finn M.D.   On: 01/12/2021 16:37    EKG: Reordered due to initial EKG low quality  Assessment/Plan Active Problems:   CVA (cerebral vascular accident) ([place])  (please populate well all problems here in Problem List. (For example, if patient is on BP meds at home and you resume or decide to hold them, it is a problem that needs to be her. Same for CAD, COPD, HLD and so on)  Frequent falls -This seems to be the disable symptom.  Muscle strength and light touch sensation appear to be intact otherwise.  Slight right lower extremity weakness compared to the left side seems associated with severe right knee pain, so decide to check right Knee Xray. -PT evaluation.  Slurred speech -Appears to be having the underlying both receptive and expressive aphasia. Agreed with MRI brain.  Acute  metabolic encephalopathy -UA pending, send TSH RPR B12 level. -MRI to rule out any intracranial etiology.  Low suspicion for seizure at this time given the variety of symptoms not quite compatible with seizure. Neuro follows.  Visual hallucination -No particular etiology identified, will work up as part of the encephalopathy problem.  Right knee pain -Xray and continue home pain meds.  Chronic systolic CHF -Mild hypervolemia/dehydration, continue IV fluid today and hold  Lasix tonight restart Lasix tomorrow  IIDM -Hold Metformin for now, add sliding scale  HTN -Restart Lasix in AM given NPO status and on IVF -Continue other home BP meds.  DVT prophylaxis: Lovenox  code Status: fULL Code Family Communication: Wife at bedside Disposition Plan: Expect more than 2 midnight hospital stay to work-up etiology for metabolic encephalopathy Consults called: Neurology Admission status: Tele admit   Lequita Halt MD [facility] Pager [phone]  01/12/2021, 6:38 PM

## 2021-01-12 NOTE — Consult Note (Signed)
Neurology Consultation Reason for Consult: Weakness and confusion Requesting Physician: Wynetta Fines  CC: Frequent falls and hallucinations  History is obtained from: Patient, chart review and wife  HPI: Vincent [name] is a 70 y.o. male with a past medical history significant for coronary artery disease s/p LAD stent (May 2007), ischemic cardiomyopathy (EF 40 to 50%) hypertension, hyperlipidemia, diabetes, chronic pain on chronic opiates, right ankle abscess surgery (1986) and right knee arthroplasty (2017)  Home medication list is notable for the following medications that may contribute to confusion: Cyclobenzaprine 10 mg every 8 hours as needed, Ativan 1 mg every 8 hours as needed, sertraline 50 mg daily, tramadol 50 mg every 8 hours as needed, hydrocodone-acetaminophen 5-325 every 8 hours as needed  Per chart review, patient has been having slurred speech, weakness, and confusion for 1 month, and was trying to go to [facility] for an MRI but was too weak to get out of the car for which EMS was called.    Per history provided by the patient, he had an episode of loss of consciousness while at a regular 59-month checkup with his primary care physician.  He reports he has been having frequent falls at home and does not know that there is any preceding symptoms with these falls.  He denies any significant confusion and notes he has not been sleeping well secondary to pain and tossing and turning trying to get comfortable.  To me he denied headache but also had significant difficulty hearing me.  He reports he chronically wears diapers at home.  Additional history provided by the wife, she reports that after his PCP appointment was lost consciousness he has been having confusion and lightheadedness.  She does feel that his falls are preceded by feeling lightheaded.  She additionally notes that he frequently stares or nods off when sitting up.  He has day night reversal, sleeping most of the day at  sleeping very well at night.  He is also quick to anger throwing tables or pounding on the table for which she increased his Ativan from 1 mg twice daily (he has taken this chronically for at least 5 years) to 1 mg 3 times daily.  Sometimes the medication history she provides is a bit inconsistent for example she reported he is not taking hydrocodone but then stated that she does give it to him once or twice a day but not every day and then states that he does take it every day.  He has had visual hallucinations of people involving members of their, horses, as well as delusions that his wife or the doctors are trying to kill him, which all started in mid March.  Additionally he reports that he has been having a headache that is right-sided for the past 3 to 5 weeks.  She does not feel like there is any particular time of the day that he reports it is worse.  He does have associated nausea but has only had a single episode of emesis 3 weeks ago that she attributed to food that day.  He does seem to be able to fall asleep with that so she is not sure that it is worse when he is laying down.  It has not improved with either Tylenol more hydrocodone.  Despite this he is eating and drinking okay he has not been losing weight and has been febrile.  He has been complaining about blurry vision that is intermittent, perhaps worse over the course of the day.  Overall she reports that he has had at least 16 falls in the past 2 weeks and she thinks that his full body pain would be related to that.  Last prior neurology note was from 2017 at which time he presented as a code stroke for possible left-sided weakness and slurred speech; he was notably encephalopathic at that time.  Interestingly his symptoms improved after he received Ativan and he was able to verbalize everything that had occurred before.  The thought was that this is a psychosomatic disorder presentation and he was discharged.  LKW: 1 month prior to  presentation tPA given?: No, due to out of the window Premorbid modified rankin scale:     2 - Slight disability. Able to look after own affairs without assistance, but unable to carry out all previous activities.  ROS: Limited secondary to patient being hard of hearing and confusion  Past Medical History:  Diagnosis Date  . Acute MI ([place])   . Allergy   . Anxiety   . Arthritis   . Coronary artery disease   . Depression   . Diabetes mellitus without complication ([place])   . Diverticulosis    mild  . Hemorrhoids   . HOH (hard of hearing)   . Hx of colonic polyps   . Hyperlipidemia   . Hypertension   . Left ventricular dysfunction    hx of   Past Surgical History:  Procedure Laterality Date  . ANGIOPLASTY     and bare metal stent placement  . ANKLE SURGERY Right 1986   i&d abscess- pt states "put a bone in it"  . CORONARY ANGIOPLASTY WITH STENT PLACEMENT    . KNEE ARTHROPLASTY Right 08/09/2016   Procedure: RIGHT TOTAL KNEE ARTHROPLASTY WITH COMPUTER NAVIGATION;  Surgeon: Rod Can, MD;  Location: [facility];  Service: Orthopedics;  Laterality: Right;  Needs RNFA  . LEFT HEART CATH AND CORONARY ANGIOGRAPHY N/A 11/21/2018   Procedure: LEFT HEART CATH AND CORONARY ANGIOGRAPHY;  Surgeon: Burnell Blanks, MD;  Location: [facility];  Service: Cardiovascular;  Laterality: N/A;   Current Outpatient Medications  Medication Instructions  . aspirin EC 81 mg, Oral, Daily  . cyclobenzaprine (FLEXERIL) 10 mg, Oral, 2 times daily  . diazepam (VALIUM) 5-10 mg, Oral, See admin instructions, Take 5-10 mg by mouth one hour prior to scan(s)  . furosemide (LASIX) 40 MG tablet TAKE 1 TABLET BY MOUTH TWICE A DAY  . HYDROcodone-acetaminophen (NORCO/VICODIN) 5-325 MG tablet 1 tablet, Oral, Every 8 hours PRN  . isosorbide mononitrate (IMDUR) 30 mg, Oral, Daily  . Jardiance 25 mg, Oral, Daily  . LORazepam (ATIVAN) 1 mg, Oral, Every 8 hours  . meloxicam (MOBIC) 7.5 mg, Oral, Daily  .  metFORMIN (GLUCOPHAGE) 1,000 mg, Oral, 2 times daily  . metoprolol tartrate (LOPRESSOR) 50 MG tablet TAKE 1 TABLET BY MOUTH TWICE A DAY  . nitroGLYCERIN (NITROSTAT) 0.4 mg, Sublingual, Every 5 min PRN  . propranolol (INDERAL) 20 mg, Oral, See admin instructions, Take 20 mg (2 tablets) by mouth one hour prior to MRI scan(s)  . ramipril (ALTACE) 2.5 mg, Oral, Daily  . sertraline (ZOLOFT) 50 mg, Oral, Daily at bedtime  . simvastatin (ZOCOR) 40 mg, Oral, Daily at bedtime  . traMADol (ULTRAM) 50 mg, Oral, Every 8 hours PRN   Off of tramadol and norco for a few years, not on flexeril either   Family History  Problem Relation Age of Onset  . Coronary artery disease Other  positive family hx of  . Colon cancer Neg Hx   . Esophageal cancer Neg Hx   . Liver cancer Neg Hx   . Pancreatic cancer Neg Hx   . Rectal cancer Neg Hx   . Stomach cancer Neg Hx    Social History:  reports that he quit smoking about 15 years ago. His smoking use included cigars. He quit smokeless tobacco use about 15 years ago. He reports that he does not drink alcohol and does not use drugs.   Exam: Current vital signs: BP 128/73   Pulse 79   Temp 98 F (36.7 C) (Oral)   Resp 14   SpO2 95%  Vital signs in last 24 hours: Temp:  [98 F (36.7 C)] 98 F (36.7 C) (04/07 1521) Pulse Rate:  [76-84] 79 (04/07 1815) Resp:  [14-20] 14 (04/07 1815) BP: (112-128)/(68-76) 128/73 (04/07 1815) SpO2:  [92 %-98 %] 95 % (04/07 1815)   Physical Exam  Constitutional: Appears well-developed and well-nourished.  Psych: Affect appropriate to situation, calm and cooperative for me at 9 PM on 4/7 Eyes: No scleral injection HENT: No oropharyngeal obstruction. Edentulous  MSK: no joint deformities. Well healed right knee replacement Cardiovascular: Normal rate and regular rhythm.  Respiratory: Effort normal, non-labored breathing GI: Soft.  No distension. There is no tenderness.  Skin: Scattered picking type lesions on his  legs in particular  Neuro: Mental Status: Patient is awake, alert, oriented to person, place, month, year (April 2022), and situation. Patient is able to give a reasonable history overall, though he perseverates on right knee pain which he reports has been ongoing since surgery 6 years ago No signs of aphasia or neglect Cranial Nerves: II: Visual Fields are full. Pupils are equal, round, and reactive to light.   III,IV, VI: EOMI without ptosis or diploplia.  V: Facial sensation is symmetric to temperature VII: Facial movement is symmetric.  VIII: hearing is intact to voice X: Uvula elevates symmetrically XI: Shoulder shrug and head turn is symmetric. XII: tongue is midline without atrophy or fasciculations.  Motor: Tone is normal. Bulk is normal. 5/5 strength was present in all four extremities, except for pain limitation in bilateral lower extremities, right leg more pain limited than the left leg.  With encouragement, right leg is at least 4/5 hip flexion, 5/5 knee extension, 4/5 knee flexion, 5/5 foot dorsiflexion, 4/5 foot plantarflexion.  Left leg is 5/5 other than hip flexion which is 4/5 but pain limited Sensory: Sensation is symmetric to light touch and temperature in the arms and legs.  He reports it is intact to temperature and gives inconsistent responses to whether there is a length dependent loss of temperature sensation Deep Tendon Reflexes: 3+ and symmetric in the biceps and patellae, with positive jaw jerk.  Notably he is exquisitely tender to reflex testing everywhere, and his jumping movements in response to even gentle taps makes it hard to definitively assess reflexes though I do feel they are truly brisk Plantars: Toes are downgoing bilaterally.  Cerebellar: FNF and HKS are intact bilaterally, more hesitant to complete this task with the right leg but he does perform a short portion of Gait deferred  I have reviewed labs in epic and the results pertinent to this  consultation are: Comprehensive metabolic panel notable for mildly decreased total protein at 6.1 and mildly elevated glucose at 125, otherwise normal including creatinine at baseline (0.96)  CBC notably normal, with resolution of prior mild anemia  (Hemoglobin 14.2 from 10.6  in 2017)  Covid and influenza negative  No results found for: VITAMINB12  Lab Results  Component Value Date   HGBA1C 6.1 (H) 08/02/2016  No results found for: TSH   EKG QTc 449 (V1 lead missing)  I have personally reviewed the images obtained:  CT C-spine notable for severe C6 negative changes without clear acute process CT head notable for significant chronic microvascular disease again without clear acute process  Impression: This is a 70 year old man with frequent falls and hallucinations, as well as new onset headaches.  Given the headaches are side locked and severe, MRI brain with and without contrast imaging is warranted.  Given bilateral leg weakness and pain throughout his upper and lower extremities, MRI C-spine is also reasonable to exclude compressive myelopathy.  Suspect his encephalopathy may be secondary to medication use in particular combination of benzodiazepines and opiates with a confounding factor of sleep disruption secondary to pain, but will also pursue a reversible causes of encephalopathy work-up as below.  Recommendations: -MRI brain with and without contrast -MRI C-spine w/o contrast -Reversible causes of dementia labs B12, thiamine, TSH, A1c, RPR, HIV, ammonia, UA, U tox -Recommend slowly weaning Ativan, 1 mg BID PRN -Seroquel  -PT/OT eval -Neurology will follow up these studies  Lesleigh Noe MD-PhD [facility] [phone] Available 7 PM to 7 AM, outside of these hours please call Neurologist on call as listed on Amion.

## 2021-01-13 ENCOUNTER — Inpatient Hospital Stay (HOSPITAL_COMMUNITY): Payer: HMO

## 2021-01-13 DIAGNOSIS — I10 Essential (primary) hypertension: Secondary | ICD-10-CM | POA: Diagnosis not present

## 2021-01-13 DIAGNOSIS — E785 Hyperlipidemia, unspecified: Secondary | ICD-10-CM | POA: Diagnosis not present

## 2021-01-13 DIAGNOSIS — R531 Weakness: Secondary | ICD-10-CM | POA: Diagnosis not present

## 2021-01-13 DIAGNOSIS — I6389 Other cerebral infarction: Secondary | ICD-10-CM | POA: Diagnosis not present

## 2021-01-13 LAB — GLUCOSE, CAPILLARY
Glucose-Capillary: 165 mg/dL — ABNORMAL HIGH (ref 70–99)
Glucose-Capillary: 162 mg/dL — ABNORMAL HIGH (ref 70–99)
Glucose-Capillary: 121 mg/dL — ABNORMAL HIGH (ref 70–99)
Glucose-Capillary: 126 mg/dL — ABNORMAL HIGH (ref 70–99)

## 2021-01-13 LAB — URINALYSIS, ROUTINE W REFLEX MICROSCOPIC
Bilirubin Urine: NEGATIVE
Glucose, UA: NEGATIVE mg/dL
Hgb urine dipstick: NEGATIVE
Ketones, ur: NEGATIVE mg/dL
Leukocytes,Ua: NEGATIVE
Nitrite: NEGATIVE
Protein, ur: NEGATIVE mg/dL
Specific Gravity, Urine: 1.01 (ref 1.005–1.030)
pH: 7 (ref 5.0–8.0)

## 2021-01-13 LAB — RAPID URINE DRUG SCREEN, HOSP PERFORMED
Amphetamines: NOT DETECTED
Barbiturates: NOT DETECTED
Benzodiazepines: POSITIVE — AB
Cocaine: NOT DETECTED
Opiates: NOT DETECTED
Tetrahydrocannabinol: POSITIVE — AB

## 2021-01-13 LAB — ECHOCARDIOGRAM COMPLETE
AR max vel: 4.16 cm2
AV Area VTI: 4.43 cm2
AV Area mean vel: 4.51 cm2
AV Mean grad: 4 mmHg
AV Peak grad: 7.4 mmHg
Ao pk vel: 1.36 m/s
Area-P 1/2: 5.13 cm2
MV M vel: 3.91 m/s
MV Peak grad: 61.2 mmHg
P 1/2 time: 446 msec
S' Lateral: 4 cm

## 2021-01-13 LAB — HIV ANTIBODY (ROUTINE TESTING W REFLEX): HIV Screen 4th Generation wRfx: NONREACTIVE

## 2021-01-13 LAB — RPR: RPR Ser Ql: NONREACTIVE

## 2021-01-13 LAB — VITAMIN B12: Vitamin B-12: 72 pg/mL — ABNORMAL LOW (ref 180–914)

## 2021-01-13 MED ORDER — HALOPERIDOL LACTATE 5 MG/ML IJ SOLN
4.0000 mg | Freq: Four times a day (QID) | INTRAMUSCULAR | Status: DC | PRN
  Administered 2021-01-13: 4 mg via INTRAVENOUS
  Filled 2021-01-13: qty 1

## 2021-01-13 MED ORDER — HALOPERIDOL LACTATE 5 MG/ML IJ SOLN
5.0000 mg | Freq: Four times a day (QID) | INTRAMUSCULAR | Status: DC | PRN
  Administered 2021-01-13 – 2021-01-16 (×6): 5 mg via INTRAVENOUS
  Filled 2021-01-13 (×9): qty 1

## 2021-01-13 MED ORDER — LORAZEPAM 2 MG/ML IJ SOLN
2.0000 mg | Freq: Once | INTRAMUSCULAR | Status: AC
  Administered 2021-01-13: 2 mg via INTRAVENOUS
  Filled 2021-01-13: qty 1

## 2021-01-13 MED ORDER — FUROSEMIDE 40 MG PO TABS
40.0000 mg | ORAL_TABLET | Freq: Every day | ORAL | Status: DC
  Administered 2021-01-14: 40 mg via ORAL
  Filled 2021-01-13: qty 1

## 2021-01-13 MED ORDER — QUETIAPINE FUMARATE 25 MG PO TABS: 25.0000 mg | ORAL_TABLET | Freq: Two times a day (BID) | ORAL | Status: DC

## 2021-01-13 MED ORDER — CYANOCOBALAMIN 1000 MCG/ML IJ SOLN
1000.0000 ug | Freq: Every day | INTRAMUSCULAR | Status: AC
  Administered 2021-01-13 – 2021-01-19 (×7): 1000 ug via SUBCUTANEOUS
  Filled 2021-01-13 (×7): qty 1

## 2021-01-13 MED ORDER — QUETIAPINE FUMARATE 25 MG PO TABS
25.0000 mg | ORAL_TABLET | Freq: Two times a day (BID) | ORAL | Status: DC
  Administered 2021-01-13 (×2): 25 mg via ORAL
  Filled 2021-01-13 (×2): qty 1

## 2021-01-13 MED ORDER — PERFLUTREN LIPID MICROSPHERE
1.0000 mL | INTRAVENOUS | Status: AC | PRN
  Administered 2021-01-13: 1.25 mL via INTRAVENOUS
  Filled 2021-01-13: qty 10

## 2021-01-13 NOTE — Progress Notes (Signed)
PT Cancellation Note  Patient Details Name: ZAK GONDEK MRN: [id number] DOB: 24-Jul-1951   Cancelled Treatment:    Reason Eval/Treat Not Completed: Patient's level of consciousness Patient recently received Ativan and Haldol. Unable to keep eyes open. PT will re-attempt evaluation as patient becomes appropriate.   Genese Quebedeaux A. Gilford Rile PT, DPT Acute Rehabilitation Services Pager [phone] Office [phone]    Linna Hoff 01/13/2021, 12:49 PM

## 2021-01-13 NOTE — Progress Notes (Signed)
Attempted to scan MRI with ativan. Pt was asleep until scanner started. Pt then tried to come out of scanner and kept saying "I don't know where I am at" and "ya'll are trying to hurt me". He began thrashing his arms when we were trying to move him back onto his bed from the MR table. I was unable to get any diagnostic images.

## 2021-01-13 NOTE — Progress Notes (Signed)
Pt went down for MRI

## 2021-01-13 NOTE — Progress Notes (Signed)
Pt arrived from MRI. Pt stated he was thirsty and haven't had any food. Pt oriented to rm. Call bell in reach. Urinal at bedside. Bed locked and alarm set. Belongings at bedside: Shoes and clothes.

## 2021-01-13 NOTE — Progress Notes (Signed)
OT Cancellation Note  Patient Details Name: Vincent [name] MRN: [id number] DOB: [birth year]   Cancelled Treatment:    Reason Eval/Treat Not Completed: Patient's level of consciousness. Patient has received Ativan and Haldol. Unable to keep eyes open. Will hold until patient more appropriate for therapy.  [name] L Robinson Brinkley 01/13/2021, 12:18 PM

## 2021-01-13 NOTE — Progress Notes (Addendum)
PROGRESS NOTE        PATIENT DETAILS Name: Vincent [name] Age: 70 y.o. Sex: male Date of Birth: [birth year] Admit Date: 01/12/2021 Admitting Physician Lequita Halt, MD KGU:[id number], Emily Filbert., MD  Brief Narrative: Patient is a 70 y.o. male with history of insulin-dependent DM-2, HTN, CAD, chronic systolic heart failure, chronic pain syndrome on narcotics/benzos-presented with a 2-week history of worsening confusion, visual hallucinations and frequent falls.  Spouse reports slurred speech for a few days as well.  Patient was subsequently admitted to the hospitalist service for further evaluation and treatment.  Significant events: 4/7>> admit for evaluation of slurred speech, confusion, hallucination, frequent falls  Significant studies: 4/7>> CT head: Negative for acute abnormalities 4/7>> CT C-spine: No fracture 4/7>> x-ray right knee: Right knee replacement without hardware loosening or fracture 4/8>> Echo: EF 45-50%, apical kinesis, grade 1 diastolic dysfunction.  Swirling of contrast at apex consistent with low flow but no thrombus. 4/8>> vitamin B12: 72  Antimicrobial therapy: None  Microbiology data: None  Procedures : None  Consults: Neurology  DVT Prophylaxis : enoxaparin (LOVENOX) injection 40 mg Start: 01/13/21 1000   Subjective: Attempting to get out of bed-speech is more slurred than baseline (spouse claims some slurred speech because his mustache goes into his mouth).  Attempting to get out of bed-follow simple commands.  Assessment/Plan: Encephalopathy/slurred speech/visual hallucinations: Probable polypharmacy-but need to rule out CVA due to slurred speech-echo findings (smoke seen)-vitamin B12 deficiency may be contributing as well-hold benzos/narcotics for now-starting Seroquel-use Haldol as needed for agitation/confusion-await MRI brain-but suspect currently will not cooperate enough to get an MRI done.  I have ordered safety  sitter as well.  Vitamin B12 deficiency: Starting supplementation-recheck vitamin B12 levels in 3 months.  Combined chronic systolic and diastolic heart failure: Euvolemic-on Lasix.   CAD-s/p PCI to LAD in 2007: No anginal symptoms-has apical akinesis on echo-most recent nuclear stress test in 2017 showed a fixed defect in the apex stable for outpatient follow-up with primary cardiologist.  Remains on aspirin.  HTN: Blood pressure controlled-continue ramipril, Imdur  HLD: Continue statin-LDL 60.  DM-2 (A1c 7.2 on 4/7): CBG stable with SSI-hold all oral hypoglycemic agents  Recent Labs    01/13/21 0638 01/13/21 1144  GLUCAP 165* 162*    Obesity: Estimated body mass index is 34.21 kg/m as calculated from the following:   Height as of 07/26/20: 5\' 8"  (1.727 m).   Weight as of 07/26/20: 102.1 kg.   Diet: Diet Order            Diet heart healthy/carb modified Room service appropriate? Yes; Fluid consistency: Thin  Diet effective now                  Code Status: Full code   Family Communication: Spouse at bedside  Disposition Plan: Status is: Inpatient  Remains inpatient appropriate because:Inpatient level of care appropriate due to severity of illness   Dispo: The patient is from: Home              Anticipated d/c is to: Home              Patient currently is not medically stable to d/c.   Difficult to place patient No        Barriers to Discharge: Ongoing altered mental status-awaiting further work-up-see above  Antimicrobial agents: Anti-infectives (From  admission, onward)   None       Time spent: 35 minutes-Greater than 50% of this time was spent in counseling, explanation of diagnosis, planning of further management, and coordination of care.  MEDICATIONS: Scheduled Meds: .  stroke: mapping our early stages of recovery book   Does not apply Once  . aspirin EC  81 mg Oral Daily  . cyanocobalamin  1,000 mcg Subcutaneous Daily  . enoxaparin  (LOVENOX) injection  40 mg Subcutaneous Q24H  . furosemide  40 mg Oral BID  . insulin aspart  0-15 Units Subcutaneous TID WC  . isosorbide mononitrate  30 mg Oral Daily  . metoprolol tartrate  50 mg Oral BID  . QUEtiapine  25 mg Oral BID  . ramipril  2.5 mg Oral Daily  . simvastatin  40 mg Oral QHS   Continuous Infusions: PRN Meds:.acetaminophen **OR** acetaminophen (TYLENOL) oral liquid 160 mg/5 mL **OR** acetaminophen, haloperidol lactate, HYDROcodone-acetaminophen, LORazepam, senna-docusate, traMADol   PHYSICAL EXAM: Vital signs: Vitals:   01/13/21 0325 01/13/21 0700 01/13/21 1018 01/13/21 1251  BP: 126/72 (!) 143/76 136/79 122/84  Pulse: 73 87 78 89  Resp: 16 16 20 16   Temp: 97.6 F (36.4 C) 97.6 F (36.4 C) (!) 97.2 F (36.2 C)   TempSrc: Oral Oral Oral   SpO2: 93%  97% 96%   There were no vitals filed for this visit. There is no height or weight on file to calculate BMI.   Gen Exam: Confused-slurred speech HEENT:atraumatic, normocephalic Chest: B/L clear to auscultation anteriorly CVS:S1S2 regular Abdomen:soft non tender, non distended Extremities:no edema Neurology: Difficult exam-but seems to be moving all 4 extremities. Skin: no rash  I have personally reviewed following labs and imaging studies  LABORATORY DATA: CBC: Recent Labs  Lab 01/12/21 1548  WBC 9.6  HGB 14.2  HCT 42.2  MCV 91.7  PLT 789    Basic Metabolic Panel: Recent Labs  Lab 01/12/21 1548  NA 136  K 4.2  CL 101  CO2 28  GLUCOSE 125*  BUN 9  CREATININE 0.96  CALCIUM 9.4    GFR: CrCl cannot be calculated (Unknown ideal weight.).  Liver Function Tests: Recent Labs  Lab 01/12/21 1548  AST 16  ALT 13  ALKPHOS 96  BILITOT 0.7  PROT 6.1*  ALBUMIN 3.8   No results for input(s): LIPASE, AMYLASE in the last 168 hours. Recent Labs  Lab 01/12/21 2221  AMMONIA 11    Coagulation Profile: No results for input(s): INR, PROTIME in the last 168 hours.  Cardiac Enzymes: No  results for input(s): CKTOTAL, CKMB, CKMBINDEX, TROPONINI in the last 168 hours.  BNP (last 3 results) No results for input(s): PROBNP in the last 8760 hours.  Lipid Profile: Recent Labs    01/12/21 2221  CHOL 118  HDL 46  LDLCALC 60  TRIG 61  CHOLHDL 2.6    Thyroid Function Tests: Recent Labs    01/12/21 2221  TSH 2.095    Anemia Panel: Recent Labs    01/12/21 2221  VITAMINB12 72*    Urine analysis:    Component Value Date/Time   COLORURINE YELLOW 01/13/2021 [place] 01/13/2021 0614   LABSPEC 1.010 01/13/2021 0614   PHURINE 7.0 01/13/2021 [place] 01/13/2021 0614   HGBUR NEGATIVE 01/13/2021 [place] 01/13/2021 [place] 01/13/2021 0614   PROTEINUR NEGATIVE 01/13/2021 0614   UROBILINOGEN 0.2 01/06/2014 1257   NITRITE NEGATIVE 01/13/2021 3810  LEUKOCYTESUR NEGATIVE 01/13/2021 4742    Sepsis Labs: Lactic Acid, Venous No results found for: LATICACIDVEN  MICROBIOLOGY: Recent Results (from the past 240 hour(s))  Resp Panel by RT-PCR (Flu A&B, Covid) Nasopharyngeal Swab     Status: None   Collection Time: 01/12/21  4:19 PM   Specimen: Nasopharyngeal Swab; Nasopharyngeal(NP) swabs in vial transport medium  Result Value Ref Range Status   SARS Coronavirus 2 by RT PCR NEGATIVE NEGATIVE Final    Comment: (NOTE) SARS-CoV-2 target nucleic acids are NOT DETECTED.  The SARS-CoV-2 RNA is generally detectable in upper respiratory specimens during the acute phase of infection. The lowest concentration of SARS-CoV-2 viral copies this assay can detect is 138 copies/mL. A negative result does not preclude SARS-Cov-2 infection and should not be used as the sole basis for treatment or other patient management decisions. A negative result may occur with  improper specimen collection/handling, submission of specimen other than nasopharyngeal swab, presence of viral mutation(s) within the areas targeted  by this assay, and inadequate number of viral copies(<138 copies/mL). A negative result must be combined with clinical observations, patient history, and epidemiological information. The expected result is Negative.  Fact Sheet for Patients:  EntrepreneurPulse.com.au  Fact Sheet for Healthcare Providers:  IncredibleEmployment.be  This test is no t yet approved or cleared by the [place] FDA and  has been authorized for detection and/or diagnosis of SARS-CoV-2 by FDA under an Emergency Use Authorization (EUA). This EUA will remain  in effect (meaning this test can be used) for the duration of the COVID-19 declaration under Section 564(b)(1) of the Act, 21 U.S.C.section 360bbb-3(b)(1), unless the authorization is terminated  or revoked sooner.       Influenza A by PCR NEGATIVE NEGATIVE Final   Influenza B by PCR NEGATIVE NEGATIVE Final    Comment: (NOTE) The Xpert Xpress SARS-CoV-2/FLU/RSV plus assay is intended as an aid in the diagnosis of influenza from Nasopharyngeal swab specimens and should not be used as a sole basis for treatment. Nasal washings and aspirates are unacceptable for Xpert Xpress SARS-CoV-2/FLU/RSV testing.  Fact Sheet for Patients: EntrepreneurPulse.com.au  Fact Sheet for Healthcare Providers: IncredibleEmployment.be  This test is not yet approved or cleared by the [place] FDA and has been authorized for detection and/or diagnosis of SARS-CoV-2 by FDA under an Emergency Use Authorization (EUA). This EUA will remain in effect (meaning this test can be used) for the duration of the COVID-19 declaration under Section 564(b)(1) of the Act, 21 U.S.C. section 360bbb-3(b)(1), unless the authorization is terminated or revoked.  Performed at [facility], [place] [address] [place], [place] [address]     RADIOLOGY STUDIES/RESULTS: CT Head Wo Contrast  Result Date:  01/12/2021 CLINICAL DATA:  Slurred speech, weakness, confusion for a month. EXAM: CT HEAD WITHOUT CONTRAST CT CERVICAL SPINE WITHOUT CONTRAST TECHNIQUE: Multidetector CT imaging of the head and cervical spine was performed following the standard protocol without intravenous contrast. Multiplanar CT image reconstructions of the cervical spine were also generated. COMPARISON:  None. FINDINGS: CT HEAD FINDINGS Brain: Patchy and confluent areas of decreased attenuation are noted throughout the deep and periventricular white matter of the cerebral hemispheres bilaterally, compatible with chronic microvascular ischemic disease. No evidence of large-territorial acute infarction. No parenchymal hemorrhage. No mass lesion. No extra-axial collection. No mass effect or midline shift. No hydrocephalus. Basilar cisterns are patent. Vascular: No hyperdense vessel. Atherosclerotic calcifications are present within the cavernous internal carotid arteries. Skull: No acute fracture or focal lesion. Sinuses/Orbits:  Paranasal sinuses and mastoid air cells are clear. The orbits are unremarkable. Other: None. CT CERVICAL SPINE FINDINGS Alignment: Reversal of the normal cervical lordosis centered at the C6 level. Findings due to severe degenerative changes at the level. Otherwise normal alignment. Skull base and vertebrae: Multilevel degenerative changes of the spine that are most prominent at the C1-C2 and C5 through C7 levels. No acute fracture. No aggressive appearing focal osseous lesion or focal pathologic process. Soft tissues and spinal canal: No prevertebral fluid or swelling. No visible canal hematoma. Upper chest: Unremarkable. Other: None. IMPRESSION: 1. No acute intracranial abnormality. 2. No acutely displaced fracture or traumatic listhesis of the cervical spine. Electronically Signed   By: Iven Finn M.D.   On: 01/12/2021 16:37   CT Cervical Spine Wo Contrast  Result Date: 01/12/2021 CLINICAL DATA:  Slurred speech,  weakness, confusion for a month. EXAM: CT HEAD WITHOUT CONTRAST CT CERVICAL SPINE WITHOUT CONTRAST TECHNIQUE: Multidetector CT imaging of the head and cervical spine was performed following the standard protocol without intravenous contrast. Multiplanar CT image reconstructions of the cervical spine were also generated. COMPARISON:  None. FINDINGS: CT HEAD FINDINGS Brain: Patchy and confluent areas of decreased attenuation are noted throughout the deep and periventricular white matter of the cerebral hemispheres bilaterally, compatible with chronic microvascular ischemic disease. No evidence of large-territorial acute infarction. No parenchymal hemorrhage. No mass lesion. No extra-axial collection. No mass effect or midline shift. No hydrocephalus. Basilar cisterns are patent. Vascular: No hyperdense vessel. Atherosclerotic calcifications are present within the cavernous internal carotid arteries. Skull: No acute fracture or focal lesion. Sinuses/Orbits: Paranasal sinuses and mastoid air cells are clear. The orbits are unremarkable. Other: None. CT CERVICAL SPINE FINDINGS Alignment: Reversal of the normal cervical lordosis centered at the C6 level. Findings due to severe degenerative changes at the level. Otherwise normal alignment. Skull base and vertebrae: Multilevel degenerative changes of the spine that are most prominent at the C1-C2 and C5 through C7 levels. No acute fracture. No aggressive appearing focal osseous lesion or focal pathologic process. Soft tissues and spinal canal: No prevertebral fluid or swelling. No visible canal hematoma. Upper chest: Unremarkable. Other: None. IMPRESSION: 1. No acute intracranial abnormality. 2. No acutely displaced fracture or traumatic listhesis of the cervical spine. Electronically Signed   By: Iven Finn M.D.   On: 01/12/2021 16:37   DG Knee Complete 4 Views Right  Result Date: 01/12/2021 CLINICAL DATA:  Atraumatic right knee pain. EXAM: RIGHT KNEE - COMPLETE 4+  VIEW COMPARISON:  January 07, 2018 FINDINGS: A right knee replacement is seen without evidence of surrounding lucency to suggest the presence of hardware loosening or infection. No evidence of an acute fracture or dislocation. A small joint effusion is seen. IMPRESSION: 1. Right knee replacement without evidence of hardware loosening or infection. 2. Small joint effusion. Electronically Signed   By: Virgina Norfolk M.D.   On: 01/12/2021 19:39   ECHOCARDIOGRAM COMPLETE  Result Date: 01/13/2021    ECHOCARDIOGRAM REPORT   Patient Name:   Vincent [name] Date of Exam: 01/13/2021 Medical Rec #:  [id number]      Height:       68.0 in Accession #:    [id number]     Weight:       225.0 lb Date of Birth:  [birth year]     BSA:          2.149 m Patient Age:    62 years       BP:  126/72 mmHg Patient Gender: M              HR:           96 bpm. Exam Location:  Inpatient Procedure: 2D Echo, Intracardiac Opacification Agent, Cardiac Doppler and Color            Doppler Indications:    TIA  History:        Patient has no prior history of Echocardiogram examinations. CAD                 and Acute MI; Risk Factors:Hypertension, Dyslipidemia and                 Diabetes.  Sonographer:    Luisa Hart RDCS Referring Phys: [id number] Lequita Halt  Sonographer Comments: Suboptimal apical window. IMPRESSIONS  1. Left ventricular ejection fraction, by estimation, is 45 to 50%. The left ventricle has mildly decreased function. The left ventricle demonstrates regional wall motion abnormalities (see scoring diagram/findings for description). Apical akinesis. Left ventricular diastolic parameters are consistent with Grade I diastolic dysfunction (impaired relaxation).  2. Swirling of contrast at apex consistent with low flow but no thrombus seen  3. Right ventricular systolic function is normal. The right ventricular size is normal. Tricuspid regurgitation signal is inadequate for assessing PA pressure.  4. The mitral valve is normal in  structure. No evidence of mitral valve regurgitation.  5. The aortic valve is tricuspid. Aortic valve regurgitation is trivial. Mild to moderate aortic valve sclerosis/calcification is present, without any evidence of aortic stenosis.  6. The inferior vena cava is normal in size with greater than 50% respiratory variability, suggesting right atrial pressure of 3 mmHg. FINDINGS  Left Ventricle: Left ventricular ejection fraction, by estimation, is 45 to 50%. The left ventricle has mildly decreased function. The left ventricle demonstrates regional wall motion abnormalities. Definity contrast agent was given IV to delineate the left ventricular endocardial borders. The left ventricular internal cavity size was normal in size. There is no left ventricular hypertrophy. Left ventricular diastolic parameters are consistent with Grade I diastolic dysfunction (impaired relaxation).  LV Wall Scoring: The entire apex is akinetic. The anterior wall, antero-lateral wall, anterior septum, inferior wall, posterior wall, mid inferoseptal segment, and basal inferoseptal segment are normal. Right Ventricle: The right ventricular size is normal. No increase in right ventricular wall thickness. Right ventricular systolic function is normal. Tricuspid regurgitation signal is inadequate for assessing PA pressure. Left Atrium: Left atrial size was normal in size. Right Atrium: Right atrial size was normal in size. Pericardium: There is no evidence of pericardial effusion. Mitral Valve: The mitral valve is normal in structure. No evidence of mitral valve regurgitation. Tricuspid Valve: The tricuspid valve is normal in structure. Tricuspid valve regurgitation is trivial. Aortic Valve: The aortic valve is tricuspid. Aortic valve regurgitation is trivial. Aortic regurgitation PHT measures 446 msec. Mild to moderate aortic valve sclerosis/calcification is present, without any evidence of aortic stenosis. Aortic valve mean gradient measures  4.0 mmHg. Aortic valve peak gradient measures 7.4 mmHg. Aortic valve area, by VTI measures 4.43 cm. Pulmonic Valve: The pulmonic valve was not well visualized. Pulmonic valve regurgitation is not visualized. Aorta: The aortic root and ascending aorta are structurally normal, with no evidence of dilitation. Venous: The inferior vena cava is normal in size with greater than 50% respiratory variability, suggesting right atrial pressure of 3 mmHg. IAS/Shunts: The interatrial septum was not well visualized.  LEFT VENTRICLE PLAX 2D LVIDd:  5.10 cm  Diastology LVIDs:         4.00 cm  LV e' medial:    4.79 cm/s LV PW:         0.70 cm  LV E/e' medial:  14.5 LV IVS:        0.70 cm  LV e' lateral:   9.14 cm/s LVOT diam:     2.70 cm  LV E/e' lateral: 7.6 LV SV:         110 LV SV Index:   51 LVOT Area:     5.73 cm  RIGHT VENTRICLE RV S prime:     9.79 cm/s  PULMONARY VEINS TAPSE (M-mode): 2.1 cm     A Reversal Duration: 100.00 msec                            A Reversal Velocity: 29.70 cm/s                            Diastolic Velocity:  63.87 cm/s                            S/D Velocity:        1.40                            Systolic Velocity:   56.43 cm/s LEFT ATRIUM             Index LA diam:        2.80 cm 1.30 cm/m LA Vol (A2C):   36.8 ml 17.13 ml/m LA Vol (A4C):   17.1 ml 7.96 ml/m LA Biplane Vol: 26.0 ml 12.10 ml/m  AORTIC VALVE                   PULMONIC VALVE AV Area (Vmax):    4.16 cm    PV Vmax:       0.76 m/s AV Area (Vmean):   4.51 cm    PV Vmean:      55.300 cm/s AV Area (VTI):     4.43 cm    PV VTI:        0.140 m AV Vmax:           136.00 cm/s PV Peak grad:  2.3 mmHg AV Vmean:          86.400 cm/s PV Mean grad:  1.0 mmHg AV VTI:            0.248 m AV Peak Grad:      7.4 mmHg AV Mean Grad:      4.0 mmHg LVOT Vmax:         98.80 cm/s LVOT Vmean:        68.000 cm/s LVOT VTI:          0.192 m LVOT/AV VTI ratio: 0.77 AI PHT:            446 msec  AORTA Ao Root diam: 3.50 cm Ao Asc diam:  3.50 cm MITRAL  VALVE MV Area (PHT): 5.13 cm     SHUNTS MV Decel Time: 148 msec     Systemic VTI:  0.19 m MR Peak grad: 61.2 mmHg     Systemic Diam: 2.70 cm MR Vmax:      391.00 cm/s MV E velocity: 69.40 cm/s MV A velocity: 129.00  cm/s MV E/A ratio:  0.54 Oswaldo Milian MD Electronically signed by Oswaldo Milian MD Signature Date/Time: 01/13/2021/11:23:19 AM    Final      LOS: 1 day   Oren Binet, MD  [facility]    To contact the attending provider between 7A-7P or the covering provider during after hours 7P-7A, please log into the web site [email or web address] and access using universal [place] password for that web site. If you do not have the password, please call the hospital operator.  01/13/2021, 1:21 PM

## 2021-01-13 NOTE — Plan of Care (Signed)
MRI attempted and pt has become agressive. Mittens placed on hands for safety concerns. Tele removed.  Problem: Education: Goal: Knowledge of disease or condition will improve Outcome: Progressing Goal: Knowledge of secondary prevention will improve Outcome: Progressing Goal: Knowledge of patient specific risk factors addressed and post discharge goals established will improve Outcome: Progressing Goal: Individualized Educational Video(s) Outcome: Progressing   Problem: Coping: Goal: Will verbalize positive feelings about self Outcome: Progressing Goal: Will identify appropriate support needs Outcome: Progressing   Problem: Health Behavior/Discharge Planning: Goal: Ability to manage health-related needs will improve Outcome: Progressing   Problem: Self-Care: Goal: Ability to participate in self-care as condition permits will improve Outcome: Progressing Goal: Verbalization of feelings and concerns over difficulty with self-care will improve Outcome: Progressing Goal: Ability to communicate needs accurately will improve Outcome: Progressing   Problem: Nutrition: Goal: Risk of aspiration will decrease Outcome: Progressing Goal: Dietary intake will improve Outcome: Progressing   Problem: Ischemic Stroke/TIA Tissue Perfusion: Goal: Complications of ischemic stroke/TIA will be minimized Outcome: Progressing

## 2021-01-13 NOTE — Progress Notes (Signed)
Neurology Progress Note   S:// Seen and examined Unable to provide any meaningful history In wrist restraints very agitated   O:// Current vital signs: BP 127/84 (BP Location: Right Arm)   Pulse 89   Temp 98.2 F (36.8 C)   Resp 16   SpO2 97%  Vital signs in last 24 hours: Temp:  [97.2 F (36.2 C)-98.2 F (36.8 C)] 98.2 F (36.8 C) (04/08 1540) Pulse Rate:  [72-89] 89 (04/08 1540) Resp:  [16-20] 16 (04/08 1540) BP: (122-143)/(72-86) 127/84 (04/08 1540) SpO2:  [93 %-97 %] 97 % (04/08 1540) General: Awake alert very agitated and restless and is in bed HEENT: Normocephalic, atraumatic appears disheveled CVS: Regular rhythm Respiratory: Breathing well saturating normally on room air Abdomen nondistended nontender Neurological exam He is awake alert extremely restless with wrist restraints in place. He is able to answer simple questions-when asked his name, he said Vincent [name], upon asking his last time he was able to say Vincent [name]. Speech is dysarthric-question if this is because he is edentulous or truly dysarthric. Poor attention concentration Difficult exam due to his agitation Appeared to have no gaze preference or deviation and no dissection S extraocular movements Facial symmetry very difficult to ascertain due to a huge mustache and beard-grossly symmetric On motor examination, he is moving both upper extremities pretty strongly and equally and also moving left lower extremity spontaneously but he is not able to move his right lower extremity much.  Remains very agitated to try any kind of extended coaching. Sensation: It seems that he has extreme tenderness when I touch his feet. Coordination difficult to assess due to restraints DTRs: He is somewhat hyperreflexic in the upper extremities but has lower extremity reflexes are extremely diminished or absent at least in the ankles.   Medications  Current Facility-Administered Medications:  .   stroke: mapping our early  stages of recovery book, , Does not apply, Once, Wynetta Fines T, MD .  acetaminophen (TYLENOL) tablet 650 mg, 650 mg, Oral, Q4H PRN **OR** acetaminophen (TYLENOL) 160 MG/5ML solution 650 mg, 650 mg, Per Tube, Q4H PRN **OR** acetaminophen (TYLENOL) suppository 650 mg, 650 mg, Rectal, Q4H PRN, Wynetta Fines T, MD .  aspirin EC tablet 81 mg, 81 mg, Oral, Daily, Wynetta Fines T, MD, 81 mg at 01/13/21 0916 .  cyanocobalamin ((VITAMIN B-12)) injection 1,000 mcg, 1,000 mcg, Subcutaneous, Daily, Ghimire, Henreitta Leber, MD, 1,000 mcg at 01/13/21 1153 .  enoxaparin (LOVENOX) injection 40 mg, 40 mg, Subcutaneous, Q24H, Wynetta Fines T, MD, 40 mg at 01/13/21 0917 .  [START ON 01/14/2021] furosemide (LASIX) tablet 40 mg, 40 mg, Oral, Daily, Ghimire, Shanker M, MD .  haloperidol lactate (HALDOL) injection 5 mg, 5 mg, Intravenous, Q6H PRN, Jonetta Osgood, MD, 5 mg at 01/13/21 1643 .  HYDROcodone-acetaminophen (NORCO/VICODIN) 5-325 MG per tablet 1 tablet, 1 tablet, Oral, Q8H PRN, Wynetta Fines T, MD .  insulin aspart (novoLOG) injection 0-15 Units, 0-15 Units, Subcutaneous, TID WC, Lequita Halt, MD, 3 Units at 01/13/21 1154 .  isosorbide mononitrate (IMDUR) 24 hr tablet 30 mg, 30 mg, Oral, Daily, Wynetta Fines T, MD, 30 mg at 01/13/21 0916 .  metoprolol tartrate (LOPRESSOR) tablet 50 mg, 50 mg, Oral, BID, Wynetta Fines T, MD, 50 mg at 01/13/21 0916 .  QUEtiapine (SEROQUEL) tablet 25 mg, 25 mg, Oral, BID, Ghimire, Henreitta Leber, MD, 25 mg at 01/13/21 1605 .  ramipril (ALTACE) capsule 2.5 mg, 2.5 mg, Oral, Daily, Wynetta Fines T, MD, 2.5 mg at 01/13/21  0916 .  senna-docusate (Senokot-S) tablet 1 tablet, 1 tablet, Oral, QHS PRN, Wynetta Fines T, MD .  simvastatin (ZOCOR) tablet 40 mg, 40 mg, Oral, QHS, Wynetta Fines T, MD, 40 mg at 01/13/21 0001 Labs CBC    Component Value Date/Time   WBC 9.6 01/12/2021 1548   RBC 4.60 01/12/2021 1548   HGB 14.2 01/12/2021 1548   HCT 42.2 01/12/2021 1548   PLT 246 01/12/2021 1548   MCV 91.7 01/12/2021  1548   MCH 30.9 01/12/2021 1548   MCHC 33.6 01/12/2021 1548   RDW 13.1 01/12/2021 1548   LYMPHSABS 2.6 05/12/2016 1326   MONOABS 0.9 05/12/2016 1326   EOSABS 0.2 05/12/2016 1326   BASOSABS 0.0 05/12/2016 1326    CMP     Component Value Date/Time   NA 136 01/12/2021 1548   K 4.2 01/12/2021 1548   CL 101 01/12/2021 1548   CO2 28 01/12/2021 1548   GLUCOSE 125 (H) 01/12/2021 1548   BUN 9 01/12/2021 1548   CREATININE 0.96 01/12/2021 1548   CALCIUM 9.4 01/12/2021 1548   PROT 6.1 (L) 01/12/2021 1548   ALBUMIN 3.8 01/12/2021 1548   AST 16 01/12/2021 1548   ALT 13 01/12/2021 1548   ALKPHOS 96 01/12/2021 1548   BILITOT 0.7 01/12/2021 1548   GFRNONAA >60 01/12/2021 1548   GFRAA >60 08/10/2016 0417    Lipid Panel     Component Value Date/Time   CHOL 118 01/12/2021 2221   TRIG 61 01/12/2021 2221   HDL 46 01/12/2021 2221   CHOLHDL 2.6 01/12/2021 2221   VLDL 12 01/12/2021 2221   LDLCALC 60 01/12/2021 2221   TSH 2.095 B12 72 A1c 7.2 RPR nonreactive HIV nonreactive Thiamine level pending  Imaging I have reviewed images in epic and the results pertinent to this consultation are: CT head chronic microvascular disease without acute process CT C-spine severe C6 degenerative changes without clear acute process.   Assessment: 70 year old man with frequent falls and hallucinations as well as new onset headaches. MR imaging attempted but too agitated to complete-MR head for the complaints of severe headache and cognitive changes as well as leg weakness. C-spine imaging for possible myelopathy. Lab work also revealed his B12 to be 26. This is extremely low and I suspect that he has peripheral neuropathy and cognitive changes secondary to severely decreased levels of B12.  Other factors contributing are probably polypharmacy.  Could have underlying cognitive decline from dementia as well..  Recommendations: B12 replenishment Thiamine replenishment Decrease sedating  medications Start Seroquel 25 twice daily PT OT evaluation MRI brain and C-spine when able to We will follow  -- Amie Portland, MD Neurologist [facility] Pager: [phone]

## 2021-01-13 NOTE — Plan of Care (Signed)
  Problem: Nutrition: Goal: Risk of aspiration will decrease Outcome: Progressing Goal: Dietary intake will improve Outcome: Progressing   Problem: Safety: Goal: Non-violent Restraint(s) Outcome: Progressing   

## 2021-01-14 DIAGNOSIS — R531 Weakness: Secondary | ICD-10-CM | POA: Diagnosis not present

## 2021-01-14 DIAGNOSIS — I10 Essential (primary) hypertension: Secondary | ICD-10-CM | POA: Diagnosis not present

## 2021-01-14 DIAGNOSIS — E785 Hyperlipidemia, unspecified: Secondary | ICD-10-CM | POA: Diagnosis not present

## 2021-01-14 LAB — COMPREHENSIVE METABOLIC PANEL
ALT: 15 U/L (ref 0–44)
AST: 21 U/L (ref 15–41)
Albumin: 4.3 g/dL (ref 3.5–5.0)
Alkaline Phosphatase: 103 U/L (ref 38–126)
Anion gap: 14 (ref 5–15)
BUN: 15 mg/dL (ref 8–23)
CO2: 20 mmol/L — ABNORMAL LOW (ref 22–32)
Calcium: 9.8 mg/dL (ref 8.9–10.3)
Chloride: 104 mmol/L (ref 98–111)
Creatinine, Ser: 1.12 mg/dL (ref 0.61–1.24)
GFR, Estimated: >60 mL/min (ref >60)
Glucose, Bld: 130 mg/dL — ABNORMAL HIGH (ref 70–99)
Potassium: 4.1 mmol/L (ref 3.5–5.1)
Sodium: 138 mmol/L (ref 135–145)
Total Bilirubin: 2.1 mg/dL — ABNORMAL HIGH (ref 0.3–1.2)
Total Protein: 7.3 g/dL (ref 6.5–8.1)

## 2021-01-14 LAB — GLUCOSE, CAPILLARY
Glucose-Capillary: 152 mg/dL — ABNORMAL HIGH (ref 70–99)
Glucose-Capillary: 123 mg/dL — ABNORMAL HIGH (ref 70–99)
Glucose-Capillary: 173 mg/dL — ABNORMAL HIGH (ref 70–99)
Glucose-Capillary: 124 mg/dL — ABNORMAL HIGH (ref 70–99)
Glucose-Capillary: 126 mg/dL — ABNORMAL HIGH (ref 70–99)

## 2021-01-14 LAB — CBC
HCT: 48.7 % (ref 39.0–52.0)
Hemoglobin: 16.5 g/dL (ref 13.0–17.0)
MCH: 30.6 pg (ref 26.0–34.0)
MCHC: 33.9 g/dL (ref 30.0–36.0)
MCV: 90.4 fL (ref 80.0–100.0)
Platelets: 238 10*3/uL (ref 150–400)
RBC: 5.39 MIL/uL (ref 4.22–5.81)
RDW: 13.1 % (ref 11.5–15.5)
WBC: 9.6 10*3/uL (ref 4.0–10.5)
nRBC: 0 % (ref 0.0–0.2)

## 2021-01-14 MED ORDER — LORAZEPAM 2 MG/ML IJ SOLN: 1.0000 mg | INTRAMUSCULAR | Status: DC

## 2021-01-14 MED ORDER — LORAZEPAM 2 MG/ML IJ SOLN
1.0000 mg | INTRAMUSCULAR | Status: AC | PRN
  Administered 2021-01-15 (×2): 1 mg via INTRAVENOUS
  Filled 2021-01-14 (×2): qty 1

## 2021-01-14 MED ORDER — QUETIAPINE FUMARATE 50 MG PO TABS
50.0000 mg | ORAL_TABLET | Freq: Two times a day (BID) | ORAL | Status: DC
  Administered 2021-01-14 – 2021-01-21 (×12): 50 mg via ORAL
  Filled 2021-01-14 (×14): qty 1

## 2021-01-14 NOTE — Progress Notes (Signed)
PROGRESS NOTE        PATIENT DETAILS Name: Vincent [name] Age: 70 y.o. Sex: male Date of Birth: May 02, 1951 Admit Date: 01/12/2021 Admitting Physician Lequita Halt, MD KKX:[id number], Emily Filbert., MD  Brief Narrative: Patient is a 70 y.o. male with history of insulin-dependent DM-2, HTN, CAD, chronic systolic heart failure, chronic pain syndrome on narcotics/benzos-presented with a 2-week history of worsening confusion, visual hallucinations and frequent falls.  Spouse reports slurred speech for a few days as well.  Patient was subsequently admitted to the hospitalist service for further evaluation and treatment.  Significant events: 4/7>> admit for evaluation of slurred speech, confusion, hallucination, frequent falls  Significant studies: 4/7>> CT head: Negative for acute abnormalities 4/7>> CT C-spine: No fracture 4/7>> x-ray right knee: Right knee replacement without hardware loosening or fracture 4/8>> Echo: EF 45-50%, apical kinesis, grade 1 diastolic dysfunction.  Swirling of contrast at apex consistent with low flow but no thrombus. 4/8>> vitamin B12: 72  Antimicrobial therapy: None  Microbiology data: None  Procedures : None  Consults: Neurology  DVT Prophylaxis : enoxaparin (LOVENOX) injection 40 mg Start: 01/13/21 1000   Subjective: Less confused compared to yesterday-however still with restraints as he attempts to get out of bed.  Bedside sitter also present.  Speech is still slurred  Assessment/Plan: Encephalopathy/slurred speech/visual hallucinations: Probable polypharmacy-but need to rule out CVA due to slurred speech-echo findings (smoke seen)-vitamin B12 deficiency may be contributing as well-continue to hold benzos/narcotics-increase Seroquel to 50 mg twice daily-use Haldol as needed.  Yesterday was too agitated and confused to tolerate an MRI-he seems to be much better today-hence MRI brain being reattempted today.  Neurology  following.   Vitamin B12 deficiency: Starting supplementation-recheck vitamin B12 levels in 3 months.  Combined chronic systolic and diastolic heart failure: Euvolemic-on Lasix.   CAD-s/p PCI to LAD in 2007: No anginal symptoms-has apical akinesis on echo-most recent nuclear stress test in 2017 showed a fixed defect in the apex stable for outpatient follow-up with primary cardiologist.  Remains on aspirin.  HTN: Blood pressure controlled-continue ramipril, Imdur  HLD: Continue statin-LDL 60.  DM-2 (A1c 7.2 on 4/7): CBG stable with SSI-hold all oral hypoglycemic agents  Recent Labs    01/13/21 2106 01/14/21 0603 01/14/21 0754  GLUCAP 126* 152* 123*    Obesity: Estimated body mass index is 34.21 kg/m as calculated from the following:   Height as of 07/26/20: 5\' 8"  (1.727 m).   Weight as of 07/26/20: 102.1 kg.   Diet: Diet Order            Diet heart healthy/carb modified Room service appropriate? Yes; Fluid consistency: Thin  Diet effective now                  Code Status: Full code   Family Communication: Spouse-Vincent [name]-[phone] updated on 4/9  Disposition Plan: Status is: Inpatient  Remains inpatient appropriate because:Inpatient level of care appropriate due to severity of illness   Dispo: The patient is from: Home              Anticipated d/c is to: Home              Patient currently is not medically stable to d/c.   Difficult to place patient No        Barriers to Discharge: Ongoing altered mental status-awaiting further work-up-see  above  Antimicrobial agents: Anti-infectives (From admission, onward)   None       Time spent: 35 minutes-Greater than 50% of this time was spent in counseling, explanation of diagnosis, planning of further management, and coordination of care.  MEDICATIONS: Scheduled Meds: .  stroke: mapping our early stages of recovery book   Does not apply Once  . aspirin EC  81 mg Oral Daily  . cyanocobalamin   1,000 mcg Subcutaneous Daily  . enoxaparin (LOVENOX) injection  40 mg Subcutaneous Q24H  . furosemide  40 mg Oral Daily  . insulin aspart  0-15 Units Subcutaneous TID WC  . isosorbide mononitrate  30 mg Oral Daily  . metoprolol tartrate  50 mg Oral BID  . QUEtiapine  50 mg Oral BID  . ramipril  2.5 mg Oral Daily  . simvastatin  40 mg Oral QHS   Continuous Infusions: PRN Meds:.acetaminophen **OR** acetaminophen (TYLENOL) oral liquid 160 mg/5 mL **OR** acetaminophen, haloperidol lactate, LORazepam, senna-docusate   PHYSICAL EXAM: Vital signs: Vitals:   01/13/21 1948 01/13/21 2353 01/14/21 0323 01/14/21 0800  BP: (!) 157/124 (!) 145/68 (!) 158/90 (!) (P) 141/85  Pulse: 97 75 92 (P) 96  Resp: 20 17 19  (P) 18  Temp: 98.4 F (36.9 C) 97.6 F (36.4 C) 97.6 F (36.4 C) (P) 99 F (37.2 C)  TempSrc: Oral Oral  (P) Oral  SpO2: 96%   (P) 95%   There were no vitals filed for this visit. There is no height or weight on file to calculate BMI.   Gen Exam: Still with slurred speech-but slightly more alert compared to yesterday.  Following more commands today. HEENT:atraumatic, normocephalic Chest: B/L clear to auscultation anteriorly CVS:S1S2 regular Abdomen:soft non tender, non distended Extremities:no edema Neurology: Difficult exam-but moving all 4 extremities.  When I took off his restraints-he tried getting out of bed. Skin: no rash  I have personally reviewed following labs and imaging studies  LABORATORY DATA: CBC: Recent Labs  Lab 01/12/21 1548 01/14/21 0327  WBC 9.6 9.6  HGB 14.2 16.5  HCT 42.2 48.7  MCV 91.7 90.4  PLT 246 657    Basic Metabolic Panel: Recent Labs  Lab 01/12/21 1548 01/14/21 0327  NA 136 138  K 4.2 4.1  CL 101 104  CO2 28 20*  GLUCOSE 125* 130*  BUN 9 15  CREATININE 0.96 1.12  CALCIUM 9.4 9.8    GFR: CrCl cannot be calculated (Unknown ideal weight.).  Liver Function Tests: Recent Labs  Lab 01/12/21 1548 01/14/21 0327  AST 16 21   ALT 13 15  ALKPHOS 96 103  BILITOT 0.7 2.1*  PROT 6.1* 7.3  ALBUMIN 3.8 4.3   No results for input(s): LIPASE, AMYLASE in the last 168 hours. Recent Labs  Lab 01/12/21 2221  AMMONIA 11    Coagulation Profile: No results for input(s): INR, PROTIME in the last 168 hours.  Cardiac Enzymes: No results for input(s): CKTOTAL, CKMB, CKMBINDEX, TROPONINI in the last 168 hours.  BNP (last 3 results) No results for input(s): PROBNP in the last 8760 hours.  Lipid Profile: Recent Labs    01/12/21 2221  CHOL 118  HDL 46  LDLCALC 60  TRIG 61  CHOLHDL 2.6    Thyroid Function Tests: Recent Labs    01/12/21 2221  TSH 2.095    Anemia Panel: Recent Labs    01/12/21 2221  VITAMINB12 72*    Urine analysis:    Component Value Date/Time   COLORURINE YELLOW 01/13/2021  [place] 01/13/2021 0614   LABSPEC 1.010 01/13/2021 0614   PHURINE 7.0 01/13/2021 [place] 01/13/2021 0614   HGBUR NEGATIVE 01/13/2021 [place] 01/13/2021 [place] 01/13/2021 0614   PROTEINUR NEGATIVE 01/13/2021 0614   UROBILINOGEN 0.2 01/06/2014 1257   NITRITE NEGATIVE 01/13/2021 0614   LEUKOCYTESUR NEGATIVE 01/13/2021 0614    Sepsis Labs: Lactic Acid, Venous No results found for: LATICACIDVEN  MICROBIOLOGY: Recent Results (from the past 240 hour(s))  Resp Panel by RT-PCR (Flu A&B, Covid) Nasopharyngeal Swab     Status: None   Collection Time: 01/12/21  4:19 PM   Specimen: Nasopharyngeal Swab; Nasopharyngeal(NP) swabs in vial transport medium  Result Value Ref Range Status   SARS Coronavirus 2 by RT PCR NEGATIVE NEGATIVE Final    Comment: (NOTE) SARS-CoV-2 target nucleic acids are NOT DETECTED.  The SARS-CoV-2 RNA is generally detectable in upper respiratory specimens during the acute phase of infection. The lowest concentration of SARS-CoV-2 viral copies this assay can detect is 138 copies/mL. A negative result does not  preclude SARS-Cov-2 infection and should not be used as the sole basis for treatment or other patient management decisions. A negative result may occur with  improper specimen collection/handling, submission of specimen other than nasopharyngeal swab, presence of viral mutation(s) within the areas targeted by this assay, and inadequate number of viral copies(<138 copies/mL). A negative result must be combined with clinical observations, patient history, and epidemiological information. The expected result is Negative.  Fact Sheet for Patients:  EntrepreneurPulse.com.au  Fact Sheet for Healthcare Providers:  IncredibleEmployment.be  This test is no t yet approved or cleared by the [place] FDA and  has been authorized for detection and/or diagnosis of SARS-CoV-2 by FDA under an Emergency Use Authorization (EUA). This EUA will remain  in effect (meaning this test can be used) for the duration of the COVID-19 declaration under Section 564(b)(1) of the Act, 21 U.S.C.section 360bbb-3(b)(1), unless the authorization is terminated  or revoked sooner.       Influenza A by PCR NEGATIVE NEGATIVE Final   Influenza B by PCR NEGATIVE NEGATIVE Final    Comment: (NOTE) The Xpert Xpress SARS-CoV-2/FLU/RSV plus assay is intended as an aid in the diagnosis of influenza from Nasopharyngeal swab specimens and should not be used as a sole basis for treatment. Nasal washings and aspirates are unacceptable for Xpert Xpress SARS-CoV-2/FLU/RSV testing.  Fact Sheet for Patients: EntrepreneurPulse.com.au  Fact Sheet for Healthcare Providers: IncredibleEmployment.be  This test is not yet approved or cleared by the [place] FDA and has been authorized for detection and/or diagnosis of SARS-CoV-2 by FDA under an Emergency Use Authorization (EUA). This EUA will remain in effect (meaning this test can be used) for the  duration of the COVID-19 declaration under Section 564(b)(1) of the Act, 21 U.S.C. section 360bbb-3(b)(1), unless the authorization is terminated or revoked.  Performed at [facility], [place] [address], [place], [place] [address]     RADIOLOGY STUDIES/RESULTS: CT Head Wo Contrast  Result Date: 01/12/2021 CLINICAL DATA:  Slurred speech, weakness, confusion for a month. EXAM: CT HEAD WITHOUT CONTRAST CT CERVICAL SPINE WITHOUT CONTRAST TECHNIQUE: Multidetector CT imaging of the head and cervical spine was performed following the standard protocol without intravenous contrast. Multiplanar CT image reconstructions of the cervical spine were also generated. COMPARISON:  None. FINDINGS: CT HEAD FINDINGS Brain: Patchy and confluent areas of decreased attenuation are noted throughout the deep and  periventricular white matter of the cerebral hemispheres bilaterally, compatible with chronic microvascular ischemic disease. No evidence of large-territorial acute infarction. No parenchymal hemorrhage. No mass lesion. No extra-axial collection. No mass effect or midline shift. No hydrocephalus. Basilar cisterns are patent. Vascular: No hyperdense vessel. Atherosclerotic calcifications are present within the cavernous internal carotid arteries. Skull: No acute fracture or focal lesion. Sinuses/Orbits: Paranasal sinuses and mastoid air cells are clear. The orbits are unremarkable. Other: None. CT CERVICAL SPINE FINDINGS Alignment: Reversal of the normal cervical lordosis centered at the C6 level. Findings due to severe degenerative changes at the level. Otherwise normal alignment. Skull base and vertebrae: Multilevel degenerative changes of the spine that are most prominent at the C1-C2 and C5 through C7 levels. No acute fracture. No aggressive appearing focal osseous lesion or focal pathologic process. Soft tissues and spinal canal: No prevertebral fluid or swelling. No visible canal hematoma. Upper chest:  Unremarkable. Other: None. IMPRESSION: 1. No acute intracranial abnormality. 2. No acutely displaced fracture or traumatic listhesis of the cervical spine. Electronically Signed   By: Iven Finn M.D.   On: 01/12/2021 16:37   CT Cervical Spine Wo Contrast  Result Date: 01/12/2021 CLINICAL DATA:  Slurred speech, weakness, confusion for a month. EXAM: CT HEAD WITHOUT CONTRAST CT CERVICAL SPINE WITHOUT CONTRAST TECHNIQUE: Multidetector CT imaging of the head and cervical spine was performed following the standard protocol without intravenous contrast. Multiplanar CT image reconstructions of the cervical spine were also generated. COMPARISON:  None. FINDINGS: CT HEAD FINDINGS Brain: Patchy and confluent areas of decreased attenuation are noted throughout the deep and periventricular white matter of the cerebral hemispheres bilaterally, compatible with chronic microvascular ischemic disease. No evidence of large-territorial acute infarction. No parenchymal hemorrhage. No mass lesion. No extra-axial collection. No mass effect or midline shift. No hydrocephalus. Basilar cisterns are patent. Vascular: No hyperdense vessel. Atherosclerotic calcifications are present within the cavernous internal carotid arteries. Skull: No acute fracture or focal lesion. Sinuses/Orbits: Paranasal sinuses and mastoid air cells are clear. The orbits are unremarkable. Other: None. CT CERVICAL SPINE FINDINGS Alignment: Reversal of the normal cervical lordosis centered at the C6 level. Findings due to severe degenerative changes at the level. Otherwise normal alignment. Skull base and vertebrae: Multilevel degenerative changes of the spine that are most prominent at the C1-C2 and C5 through C7 levels. No acute fracture. No aggressive appearing focal osseous lesion or focal pathologic process. Soft tissues and spinal canal: No prevertebral fluid or swelling. No visible canal hematoma. Upper chest: Unremarkable. Other: None. IMPRESSION: 1.  No acute intracranial abnormality. 2. No acutely displaced fracture or traumatic listhesis of the cervical spine. Electronically Signed   By: Iven Finn M.D.   On: 01/12/2021 16:37   DG Knee Complete 4 Views Right  Result Date: 01/12/2021 CLINICAL DATA:  Atraumatic right knee pain. EXAM: RIGHT KNEE - COMPLETE 4+ VIEW COMPARISON:  January 07, 2018 FINDINGS: A right knee replacement is seen without evidence of surrounding lucency to suggest the presence of hardware loosening or infection. No evidence of an acute fracture or dislocation. A small joint effusion is seen. IMPRESSION: 1. Right knee replacement without evidence of hardware loosening or infection. 2. Small joint effusion. Electronically Signed   By: Virgina Norfolk M.D.   On: 01/12/2021 19:39   ECHOCARDIOGRAM COMPLETE  Result Date: 01/13/2021    ECHOCARDIOGRAM REPORT   Patient Name:   MYSON LEVI Date of Exam: 01/13/2021 Medical Rec #:  [id number]      Height:  68.0 in Accession #:    [id number]     Weight:       225.0 lb Date of Birth:  05-Mar-1951     BSA:          2.149 m Patient Age:    2 years       BP:           126/72 mmHg Patient Gender: M              HR:           96 bpm. Exam Location:  Inpatient Procedure: 2D Echo, Intracardiac Opacification Agent, Cardiac Doppler and Color            Doppler Indications:    TIA  History:        Patient has no prior history of Echocardiogram examinations. CAD                 and Acute MI; Risk Factors:Hypertension, Dyslipidemia and                 Diabetes.  Sonographer:    Luisa Hart RDCS Referring Phys: [id number] Lequita Halt  Sonographer Comments: Suboptimal apical window. IMPRESSIONS  1. Left ventricular ejection fraction, by estimation, is 45 to 50%. The left ventricle has mildly decreased function. The left ventricle demonstrates regional wall motion abnormalities (see scoring diagram/findings for description). Apical akinesis. Left ventricular diastolic parameters are consistent with Grade  I diastolic dysfunction (impaired relaxation).  2. Swirling of contrast at apex consistent with low flow but no thrombus seen  3. Right ventricular systolic function is normal. The right ventricular size is normal. Tricuspid regurgitation signal is inadequate for assessing PA pressure.  4. The mitral valve is normal in structure. No evidence of mitral valve regurgitation.  5. The aortic valve is tricuspid. Aortic valve regurgitation is trivial. Mild to moderate aortic valve sclerosis/calcification is present, without any evidence of aortic stenosis.  6. The inferior vena cava is normal in size with greater than 50% respiratory variability, suggesting right atrial pressure of 3 mmHg. FINDINGS  Left Ventricle: Left ventricular ejection fraction, by estimation, is 45 to 50%. The left ventricle has mildly decreased function. The left ventricle demonstrates regional wall motion abnormalities. Definity contrast agent was given IV to delineate the left ventricular endocardial borders. The left ventricular internal cavity size was normal in size. There is no left ventricular hypertrophy. Left ventricular diastolic parameters are consistent with Grade I diastolic dysfunction (impaired relaxation).  LV Wall Scoring: The entire apex is akinetic. The anterior wall, antero-lateral wall, anterior septum, inferior wall, posterior wall, mid inferoseptal segment, and basal inferoseptal segment are normal. Right Ventricle: The right ventricular size is normal. No increase in right ventricular wall thickness. Right ventricular systolic function is normal. Tricuspid regurgitation signal is inadequate for assessing PA pressure. Left Atrium: Left atrial size was normal in size. Right Atrium: Right atrial size was normal in size. Pericardium: There is no evidence of pericardial effusion. Mitral Valve: The mitral valve is normal in structure. No evidence of mitral valve regurgitation. Tricuspid Valve: The tricuspid valve is normal in  structure. Tricuspid valve regurgitation is trivial. Aortic Valve: The aortic valve is tricuspid. Aortic valve regurgitation is trivial. Aortic regurgitation PHT measures 446 msec. Mild to moderate aortic valve sclerosis/calcification is present, without any evidence of aortic stenosis. Aortic valve mean gradient measures 4.0 mmHg. Aortic valve peak gradient measures 7.4 mmHg. Aortic valve area, by VTI measures 4.43 cm. Pulmonic Valve: The  pulmonic valve was not well visualized. Pulmonic valve regurgitation is not visualized. Aorta: The aortic root and ascending aorta are structurally normal, with no evidence of dilitation. Venous: The inferior vena cava is normal in size with greater than 50% respiratory variability, suggesting right atrial pressure of 3 mmHg. IAS/Shunts: The interatrial septum was not well visualized.  LEFT VENTRICLE PLAX 2D LVIDd:         5.10 cm  Diastology LVIDs:         4.00 cm  LV e' medial:    4.79 cm/s LV PW:         0.70 cm  LV E/e' medial:  14.5 LV IVS:        0.70 cm  LV e' lateral:   9.14 cm/s LVOT diam:     2.70 cm  LV E/e' lateral: 7.6 LV SV:         110 LV SV Index:   51 LVOT Area:     5.73 cm  RIGHT VENTRICLE RV S prime:     9.79 cm/s  PULMONARY VEINS TAPSE (M-mode): 2.1 cm     A Reversal Duration: 100.00 msec                            A Reversal Velocity: 29.70 cm/s                            Diastolic Velocity:  49.70 cm/s                            S/D Velocity:        1.40                            Systolic Velocity:   26.37 cm/s LEFT ATRIUM             Index LA diam:        2.80 cm 1.30 cm/m LA Vol (A2C):   36.8 ml 17.13 ml/m LA Vol (A4C):   17.1 ml 7.96 ml/m LA Biplane Vol: 26.0 ml 12.10 ml/m  AORTIC VALVE                   PULMONIC VALVE AV Area (Vmax):    4.16 cm    PV Vmax:       0.76 m/s AV Area (Vmean):   4.51 cm    PV Vmean:      55.300 cm/s AV Area (VTI):     4.43 cm    PV VTI:        0.140 m AV Vmax:           136.00 cm/s PV Peak grad:  2.3 mmHg AV Vmean:           86.400 cm/s PV Mean grad:  1.0 mmHg AV VTI:            0.248 m AV Peak Grad:      7.4 mmHg AV Mean Grad:      4.0 mmHg LVOT Vmax:         98.80 cm/s LVOT Vmean:        68.000 cm/s LVOT VTI:          0.192 m LVOT/AV VTI ratio: 0.77 AI PHT:            446  msec  AORTA Ao Root diam: 3.50 cm Ao Asc diam:  3.50 cm MITRAL VALVE MV Area (PHT): 5.13 cm     SHUNTS MV Decel Time: 148 msec     Systemic VTI:  0.19 m MR Peak grad: 61.2 mmHg     Systemic Diam: 2.70 cm MR Vmax:      391.00 cm/s MV E velocity: 69.40 cm/s MV A velocity: 129.00 cm/s MV E/A ratio:  0.54 Oswaldo Milian MD Electronically signed by Oswaldo Milian MD Signature Date/Time: 01/13/2021/11:23:19 AM    Final      LOS: 2 days   Oren Binet, MD  [facility]    To contact the attending provider between 7A-7P or the covering provider during after hours 7P-7A, please log into the web site [email or web address] and access using universal [place] password for that web site. If you do not have the password, please call the hospital operator.  01/14/2021, 10:02 AM

## 2021-01-14 NOTE — Progress Notes (Signed)
SLP Cancellation Note  Patient Details Name: Vincent [name] MRN: [id number] DOB: 1951-06-29   Cancelled treatment:       Reason Eval/Treat Not Completed: Fatigue/lethargy limiting ability to participate. Pt currently asleep upon SLP arrival. His wife is at the bedside, and believes that he is speaking more clearly today, but also says that he is becoming agitated when awake. She asks SLP to let him rest for now. Will plan to return at another time.     Osie Bond., M.A. [facility] Pager [phone] Office [phone]  01/14/2021, 2:46 PM

## 2021-01-14 NOTE — Progress Notes (Signed)
Neurology Progress Note Subjective: Patient with somewhat improved mental status today, able to state name and year Remains somewhat confused with short term memory deficits and some visual hallucinations (thinks there is a dog outside of his window) Intermittent agitation  Remains dysarthric with some unintelligible responses States that he has "passing out problems" at home Seroquel initiated at 50 mg BID, Haldol given yesterday afternoon and early this morning  Exam: Vitals:   01/14/21 0323 01/14/21 0800  BP: (!) 158/90 (!) (P) 141/85  Pulse: 92 (P) 96  Resp: 19 (P) 18  Temp: 97.6 F (36.4 C) (P) 99 F (37.2 C)  SpO2:  (P) 95%   Gen: In bed, in bilateral wrist restraints and somewhat agitated Resp: non-labored breathing, no respiratory distress Abd: soft, non-distended, non-tender  Neuro: Mental Status: Patient is awake, mildly agitated on examination, perseveration on a "girl outside of his room that knows him". He states his name and age correctly, the year as 2002 and then quickly corrects to 2022. Unable to state the month or place. He is surprised to hear he is in the hospital and then gets angry stating "I do not need to be in the hospital I am in perfect health". Follows commands but needs coaxing after being fixated on "I am not stupid and I am not sick".  Speech is severely dysarthric and mostly unintelligible. Examiner is able to make out some sentences once repeated. Patient states his speech is at baseline. Patient is edentulous. Naming and comprehension are intact. Patient does not attempt to repeat phrases.  Cranial Nerves: Pupils are 4 mm, equal, round, and reactive to light bilaterally, visual fields are full, he will fixate and track examiner, facial sensation is intact and symmetric to light touch bilaterally, face appears symmetric resting and smiling, hearing is intact to voice, phonation normal, symmetric palate rise, shoulder shrug symmetric, tongue protrudes  midline. Motor: 5/5 strength of bilateral upper extremities, 4/5 strength of bilateral lower extremities. Will raise lower extremities to command but drops them quickly due to poor attention.  Tone and bulk are normal. Sensory: Sensation to light touch intact and symmetric in bilateral upper and lower extremities. DTR: 2+ left patellae 1+ right patellae, 2+ and symmetric biceps  Pertinent Labs: CBC    Component Value Date/Time   WBC 9.6 01/14/2021 0327   RBC 5.39 01/14/2021 0327   HGB 16.5 01/14/2021 0327   HCT 48.7 01/14/2021 0327   PLT 238 01/14/2021 0327   MCV 90.4 01/14/2021 0327   MCH 30.6 01/14/2021 0327   MCHC 33.9 01/14/2021 0327   RDW 13.1 01/14/2021 0327   LYMPHSABS 2.6 05/12/2016 1326   MONOABS 0.9 05/12/2016 1326   EOSABS 0.2 05/12/2016 1326   BASOSABS 0.0 05/12/2016 1326   CMP     Component Value Date/Time   NA 138 01/14/2021 0327   K 4.1 01/14/2021 0327   CL 104 01/14/2021 0327   CO2 20 (L) 01/14/2021 0327   GLUCOSE 130 (H) 01/14/2021 0327   BUN 15 01/14/2021 0327   CREATININE 1.12 01/14/2021 0327   CALCIUM 9.8 01/14/2021 0327   PROT 7.3 01/14/2021 0327   ALBUMIN 4.3 01/14/2021 0327   AST 21 01/14/2021 0327   ALT 15 01/14/2021 0327   ALKPHOS 103 01/14/2021 0327   BILITOT 2.1 (H) 01/14/2021 0327   GFRNONAA >60 01/14/2021 0327   GFRAA >60 08/10/2016 0417   Lab Results  Component Value Date   VITAMINB12 72 (L) 01/12/2021   Drugs of Abuse  Component Value Date/Time   LABOPIA NONE DETECTED 01/13/2021 0614   COCAINSCRNUR NONE DETECTED 01/13/2021 0614   LABBENZ POSITIVE (A) 01/13/2021 0614   AMPHETMU NONE DETECTED 01/13/2021 0614   THCU POSITIVE (A) 01/13/2021 0614   LABBARB NONE DETECTED 01/13/2021 0614    Urinalysis    Component Value Date/Time   COLORURINE YELLOW 01/13/2021 [place] 01/13/2021 0614   LABSPEC 1.010 01/13/2021 0614   PHURINE 7.0 01/13/2021 0614   GLUCOSEU NEGATIVE 01/13/2021 0614   HGBUR NEGATIVE 01/13/2021  [place] 01/13/2021 0614   [place] 01/13/2021 0614   PROTEINUR NEGATIVE 01/13/2021 0614   UROBILINOGEN 0.2 01/06/2014 1257   NITRITE NEGATIVE 01/13/2021 0614   LEUKOCYTESUR NEGATIVE 01/13/2021 4081   Lab Results  Component Value Date   TSH 2.095 01/12/2021   Lab Results  Component Value Date   HGBA1C 7.2 (H) 01/12/2021   RPR: Non reactive HIV: Non reactive  Flu and SARS: negative    Imaging I have reviewed images in epic and the results pertinent to this consultation are: CT head chronic microvascular disease without acute process CT C-spine severe C6 degenerative changes without clear acute process.  Echocardiogram 4/8: 1. LVEF, by estimation, is 45 to 50%. The  left ventricle has mildly decreased function. The left ventricle demonstrates regional wall motion abnormalities. Apical akinesis. Left ventricular diastolic parameters are consistent with Grade I  diastolic dysfunction (impaired relaxation).  2. Swirling of contrast at apex consistent with low flow but no thrombus seen  3. Right ventricular systolic function is normal. The right ventricular size is normal. Tricuspid regurgitation signal is inadequate for assessing PA pressure.  4. The mitral valve is normal in structure. No evidence of mitral valve regurgitation.  5. The aortic valve is tricuspid. Aortic valve regurgitation is trivial. Mild to moderate aortic valve sclerosis/calcification is present, without any evidence of aortic stenosis.  6. The inferior vena cava is normal in size with greater than 50%  respiratory variability, suggesting right atrial pressure of 3 mmHg.   Assessment: 70 year old man with frequent falls and hallucinations as well as new onset headaches. - MR imaging attempted but initially too agitated to complete-MR head for the complaints of severe headache and cognitive changes as well as leg weakness. With improved cognition and agitation 4/9, reattempt MRI, 1 mg  Ativan q26min x 2 doses as needed for MRI completion - Echocardiogram 4/8 with apical akinesis of the left ventricle without evidence of thrombus. Due to increased risk of thrombus/stroke with apical akinesis, MRI brain will be obtained. - C-spine imaging for possible myelopathy. - Lab work also revealed his B12 to be extremely low at 72. I suspect that he has peripheral neuropathy (leading to gait disturbances ) and cognitive changes secondary to severely decreased levels of B12.   - DDx includes other contributing factors including probably polypharmacy, underlying cognitive decline from dementia, use of marijuana, and less likely stroke. Of note, mental status has improved with initiation of Seroquel 50 mg BID. - Stroke-has multiple stroke risk factors: age, left ventricular apical akinesis on echocardiogram, CAD with LAD stent in 2007, hypertension, hyperlipidemia, diabetes, and remote history of tobacco use. He is on ASA 81 mg PO daily for stroke prophylaxis.  Recommendations: - B12 replenishment - Thiamine replenishment - Minimize use of sedating medications as much as possible - Continue Seroquel 50 mg BID. Improvement in mental status after initiation. - PT OT evaluation - MRI brain and C-spine when able to. PRN Ativan  ordered for MRI completion. Discussed with nurse to avoid Ativan if he is able to tolerate MRI without sedation.  -Continue aspirin - Neurology will continue to follow up  Anibal Henderson, AGACNP-BC [facility] [phone]   Attending Neurohospitalist Addendum Patient seen and examined with APP/Resident. Agree with the history and physical as documented above. Agree with the plan as documented, which I helped formulate. I have independently reviewed the chart, obtained history, review of systems and examined the patient.I have personally reviewed pertinent head/neck/spine imaging (CT/MRI). Please feel free to call with any questions. --- Amie Portland,  MD [facility] Pager: [phone]  If 7pm to 7am, please call on call as listed on AMION.

## 2021-01-14 NOTE — Progress Notes (Signed)
Attempted to notify MRI of new IV access for administration of ativan. No answer. Will try again soon to reach them.

## 2021-01-14 NOTE — Evaluation (Signed)
Occupational Therapy Evaluation Patient Details Name: Vincent [name] MRN: [id number] DOB: Mar 05, 1951 Today's Date: 01/14/2021    History of Present Illness 70 y/o male presented to ED at [facility] on 4/7 after being sent to [facility] for emergent MRI but unable to get to car. 2 weeks prior, patient had syncopal episode at PCP and has had 16 falls since. Patient presents with slurred speech, weakness, and confusion for one month. Unable to obtain MRI secondary to agitation. CT head and c-spine show chronic microvascular disease and severe C6 degenerative changes without acute process. PMH: anxiety, CAD, depression, DM type 2, HLD, HTN   Clinical Impression   Pt PTA: Pt living with spouse, multiple falls, requiring more assist with ADL and very poor safety awareness. Pt currently, Pt limited by decreased cognition, tangentaial thoughts with difficulty attending to tasks, decreased strength and decreased ability to care for self. Spouse in room to clarify that his speech has gotten more clear. Pt modA+2 for stability with  Mobility; remains impulsive with ADL/mobility requiring multimodal cues.  Pt set-upA to maxA for ADL. Pt would benefit from continued OT skilled services for ADL, mobility and safety. OT following acutely.    Follow Up Recommendations  SNF;Supervision/Assistance - 24 hour    Equipment Recommendations  3 in 1 bedside commode    Recommendations for Other Services       Precautions / Restrictions Precautions Precautions: Fall Precaution Comments: wrist restraints Restrictions Weight Bearing Restrictions: No      Mobility Bed Mobility Overal bed mobility: Needs Assistance Bed Mobility: Supine to Sit;Sit to Supine     Supine to sit: Mod assist;+2 for safety/equipment Sit to supine: Supervision   General bed mobility comments: Pt modA for trunk elevation and cues to scoot hips to EOB.    Transfers Overall transfer level: Needs assistance Equipment used: Rolling walker  (2 wheeled) Transfers: Sit to/from Stand Sit to Stand: Mod assist;+2 physical assistance;+2 safety/equipment         General transfer comment: modA+2 for power up into standing, cues for hand placement    Balance Overall balance assessment: Needs assistance Sitting-balance support: No upper extremity supported;Feet supported Sitting balance-Leahy Scale: Fair     Standing balance support: Bilateral upper extremity supported;During functional activity Standing balance-Leahy Scale: Poor Standing balance comment: reliant on UE support and external assist                           ADL either performed or assessed with clinical judgement   ADL Overall ADL's : Needs assistance/impaired Eating/Feeding: Modified independent;Sitting   Grooming: Set up;Sitting   Upper Body Bathing: Set up;Sitting   Lower Body Bathing: Maximal assistance;Cueing for safety;Sitting/lateral leans;Sit to/from stand   Upper Body Dressing : Set up;Sitting   Lower Body Dressing: Maximal assistance;Sitting/lateral leans;Sit to/from stand   Toilet Transfer: Moderate assistance;+2 for physical assistance;+2 for safety/equipment;Cueing for safety;RW   Toileting- Clothing Manipulation and Hygiene: Maximal assistance;Sitting/lateral lean;Sit to/from stand       Functional mobility during ADLs: Moderate assistance;+2 for physical assistance;+2 for safety/equipment General ADL Comments: Pt limited by decreased cognition, tangentaial thoughts with difficulty attending to tasks, decreased strength and decreased ability to care for self. Spouse in room to clarify that his speech has gotten more clear, but still requires attention. Pt with little to no awareness of safety and his own deficits.     Vision Baseline Vision/History: No visual deficits Patient Visual Report: No change from baseline Vision  Assessment?: No apparent visual deficits     Perception     Praxis      Pertinent Vitals/Pain Pain  Assessment: Faces Faces Pain Scale: Hurts even more Pain Location: R knee Pain Descriptors / Indicators: Grimacing;Guarding Pain Intervention(s): Monitored during session;Premedicated before session;Repositioned     Hand Dominance Right   Extremity/Trunk Assessment Upper Extremity Assessment Upper Extremity Assessment: Generalized weakness   Lower Extremity Assessment Lower Extremity Assessment: Generalized weakness;RLE deficits/detail;Defer to PT evaluation RLE Deficits / Details: s/p previous R TKA; weakness and pain   Cervical / Trunk Assessment Cervical / Trunk Assessment: Normal   Communication Communication Communication: Expressive difficulties;Other (comment) (mumbling speech)   Cognition Arousal/Alertness: Awake/alert Behavior During Therapy: Impulsive Overall Cognitive Status: Impaired/Different from baseline Area of Impairment: Attention;Memory;Following commands;Safety/judgement;Awareness;Problem solving                   Current Attention Level: Sustained Memory: Decreased short-term memory Following Commands: Follows one step commands inconsistently;Follows one step commands with increased time Safety/Judgement: Decreased awareness of safety;Decreased awareness of deficits Awareness: Intellectual Problem Solving: Difficulty sequencing;Requires verbal cues;Requires tactile cues General Comments: Pt tangental at times and speaking of unrelated events with borderline inappropriateness. Spouse reporting that this is not normal. Pt A/O to self and place; confused on situation and time. Pt requires multimodal cues and severe lack of awareness of deficits and safety.   General Comments  spouse in room    Exercises     Shoulder [place] expects to be discharged to:: Private residence Living Arrangements: Spouse/significant other Available Help at Discharge: Family;Available 24 hours/day Type of Home: House Home Access:  Stairs to enter [employer] of Steps: 5 Entrance Stairs-Rails: Left Home Layout: One level     Bathroom Shower/Tub: Teacher, early years/pre: Standard     Home Equipment: Cane - single point;Walker - 2 wheels;Toilet riser          Prior Functioning/Environment Level of Independence: Needs assistance  Gait / Transfers Assistance Needed: uses RW for ambulation ADL's / Homemaking Assistance Needed: Wife assists with bathing   Comments: falls 15 times in 3 days; scrapes and bruises        OT Problem List: Decreased strength;Decreased activity tolerance;Impaired balance (sitting and/or standing);Decreased safety awareness;Pain;Decreased knowledge of use of DME or AE;Decreased cognition      OT Treatment/Interventions: Self-care/ADL training;Therapeutic exercise;Energy conservation;DME and/or AE instruction;Therapeutic activities;Cognitive remediation/compensation;Patient/family education;Balance training    OT Goals(Current goals can be found in the care plan section) Acute Rehab OT Goals Patient Stated Goal: to go home OT Goal Formulation: With patient Time For Goal Achievement: 01/28/21 Potential to Achieve Goals: Good ADL Goals Pt Will Perform Grooming: with min guard assist;standing Pt Will Perform Lower Body Dressing: with min guard assist;sit to/from stand Pt Will Transfer to Toilet: with min guard assist;ambulating Additional ADL Goal #1: Pt will follow 1-2 step commands with minimal cues for attention to task. Additional ADL Goal #2: Pt will increase to x6 mins of OOB ADL in standing with 1 seated rest break.  OT Frequency: Min 2X/week   Barriers to D/C:            Co-evaluation PT/OT/SLP Co-Evaluation/Treatment: Yes Reason for Co-Treatment: Complexity of the patient's impairments (multi-system involvement);To address functional/ADL transfers;For patient/therapist safety;Necessary to address cognition/behavior during functional activity   OT  goals addressed during session: ADL's and self-care;Strengthening/ROM      AM-PAC OT "6 Clicks" Daily Activity  Outcome Measure Help from another person eating meals?: A Little Help from another person taking care of personal grooming?: A Little Help from another person toileting, which includes using toliet, bedpan, or urinal?: A Lot Help from another person bathing (including washing, rinsing, drying)?: A Lot Help from another person to put on and taking off regular upper body clothing?: A Little Help from another person to put on and taking off regular lower body clothing?: A Lot 6 Click Score: 15   End of Session Equipment Utilized During Treatment: Gait belt;Rolling walker Nurse Communication: Mobility status  Activity Tolerance: Patient tolerated treatment well;Patient limited by pain Patient left: in bed;with call bell/phone within reach;with bed alarm set;with family/visitor present  OT Visit Diagnosis: Unsteadiness on feet (R26.81);Muscle weakness (generalized) (M62.81);Pain;Other symptoms and signs involving cognitive function Pain - Right/Left: Right Pain - part of body: Knee                Time: 1364-3837 OT Time Calculation (min): 37 min Charges:  OT General Charges $OT Visit: 1 Visit OT Evaluation $OT Eval Moderate Complexity: 1 Mod  Jefferey Pica, OTR/L Acute Rehabilitation Services Pager: [phone] Office: [phone]   Navreet Bolda C 01/14/2021, 7:55 PM

## 2021-01-14 NOTE — Evaluation (Signed)
Physical Therapy Evaluation Patient Details Name: Vincent [name] MRN: [id number] DOB: 10-19-1950 Today's Date: 01/14/2021   History of Present Illness  70 y/o male presented to ED at [facility] on 4/7 after being sent to [facility] for emergent MRI but unable to get to car. 2 weeks prior, patient had syncopal episode at PCP and has had 16 falls since. Patient presents with slurred speech, weakness, and confusion for one month. Unable to obtain MRI secondary to agitation. CT head and c-spine show chronic microvascular disease and severe C6 degenerative changes without acute process. PMH: anxiety, CAD, depression, DM type 2, HLD, HTN  Clinical Impression  PTA, patient lives with wife and uses RW for mobility and reports 15-16 falls in 3 day period. Patient is impulsive with mobility but benefits from direct instruction prior to mobility. Patient modA+2 for supine to sit but able to return to supine with supervision. Patient performed sit to stand transfer with modA+2 and RW. Patient ambulated 2 x 5' with RW and minA with +2 for safety/chair follow as patient reports LEs feeling weak with ambulation. Patient presents with decreased activity tolerance, generalized weakness, impaired balance, and impaired functional mobility. Patient will benefit from skilled PT services during acute stay to address listed deficits. Recommend SNF following discharge unless family can provide necessary 24 hour supervision and assistance. If family is able, will recommend HHPT.     Follow Up Recommendations SNF;Supervision/Assistance - 24 hour    Equipment Recommendations  None recommended by PT (patient owns necessary DME)    Recommendations for Other Services       Precautions / Restrictions Precautions Precautions: Fall Precaution Comments: wrist restraints Restrictions Weight Bearing Restrictions: No      Mobility  Bed Mobility Overal bed mobility: Needs Assistance Bed Mobility: Supine to Sit;Sit to Supine      Supine to sit: Mod assist;+2 for safety/equipment Sit to supine: Supervision   General bed mobility comments: modA for trunk elevation and bringing LEs towards EOB. Patient returned to supine with supervision at end of session    Transfers Overall transfer level: Needs assistance Equipment used: Rolling Pavielle Biggar (2 wheeled) Transfers: Sit to/from Stand Sit to Stand: Mod assist;+2 physical assistance;+2 safety/equipment         General transfer comment: modA+2 for power up into standing, cues for hand placement but poor follow through  Ambulation/Gait Ambulation/Gait assistance: Min assist;+2 safety/equipment Gait Distance (Feet): 5 Feet (x5') Assistive device: Rolling Andreika Vandagriff (2 wheeled) Gait Pattern/deviations: Step-to pattern;Decreased stride length;Decreased weight shift to right;Wide base of support Gait velocity: decreased   General Gait Details: minA for RW management and balance, +2 for safety and potential chair follow due to patient reports knees feeling weak and requesting to sit  Stairs            Wheelchair Mobility    Modified Rankin (Stroke Patients Only) Modified Rankin (Stroke Patients Only) Pre-Morbid Rankin Score: Moderate disability Modified Rankin: Moderately severe disability     Balance Overall balance assessment: Needs assistance Sitting-balance support: No upper extremity supported;Feet supported Sitting balance-Leahy Scale: Fair     Standing balance support: Bilateral upper extremity supported;During functional activity Standing balance-Leahy Scale: Poor Standing balance comment: reliant on UE support and external assist                             Pertinent Vitals/Pain Pain Assessment: Faces Faces Pain Scale: Hurts even more Pain Location: R knee Pain Descriptors / Indicators:  Grimacing;Guarding    Home Living Family/patient expects to be discharged to:: Private residence Living Arrangements: Spouse/significant  other Available Help at Discharge: Family;Available 24 hours/day Type of Home: House Home Access: Stairs to enter Entrance Stairs-Rails: Left Entrance Stairs-Number of Steps: 5 Home Layout: One level Home Equipment: Cane - single point;Gustin Zobrist - 2 wheels;Toilet riser      Prior Function Level of Independence: Needs assistance   Gait / Transfers Assistance Needed: uses RW for ambulation  ADL's / Homemaking Assistance Needed: Wife assists with bathing  Comments: falls 15 times in 3 days; scrapes and bruises     Hand Dominance        Extremity/Trunk Assessment   Upper Extremity Assessment Upper Extremity Assessment: Defer to OT evaluation    Lower Extremity Assessment Lower Extremity Assessment: Generalized weakness (R knee pain with movement)    Cervical / Trunk Assessment Cervical / Trunk Assessment: Normal  Communication   Communication: Expressive difficulties;Other (comment) (mumbling speech)  Cognition Arousal/Alertness: Awake/alert Behavior During Therapy: Impulsive Overall Cognitive Status: Impaired/Different from baseline Area of Impairment: Attention;Memory;Following commands;Safety/judgement;Awareness;Problem solving                   Current Attention Level: Sustained Memory: Decreased short-term memory Following Commands: Follows one step commands inconsistently;Follows one step commands with increased time Safety/Judgement: Decreased awareness of safety;Decreased awareness of deficits Awareness: Intellectual Problem Solving: Difficulty sequencing;Requires verbal cues;Requires tactile cues General Comments: Impulsive throughout. Decreased awareness of need for safety or deficits even after reporting numerous falls in 3 days. Multimodal cues required for attention and sequencing      General Comments      Exercises     Assessment/Plan    PT Assessment Patient needs continued PT services  PT Problem List Decreased strength;Decreased activity  tolerance;Decreased balance;Decreased mobility;Decreased cognition;Decreased safety awareness       PT Treatment Interventions DME instruction;Gait training;Stair training;Functional mobility training;Therapeutic activities;Therapeutic exercise;Balance training;Neuromuscular re-education;Patient/family education    PT Goals (Current goals can be found in the Care Plan section)  Acute Rehab PT Goals Patient Stated Goal: to go home PT Goal Formulation: With patient/family Time For Goal Achievement: 01/28/21 Potential to Achieve Goals: Good    Frequency Min 3X/week   Barriers to discharge        Co-evaluation PT/OT/SLP Co-Evaluation/Treatment: Yes Reason for Co-Treatment: For patient/therapist safety;To address functional/ADL transfers;Necessary to address cognition/behavior during functional activity PT goals addressed during session: Mobility/safety with mobility;Balance;Proper use of DME         AM-PAC PT "6 Clicks" Mobility  Outcome Measure Help needed turning from your back to your side while in a flat bed without using bedrails?: A Lot Help needed moving from lying on your back to sitting on the side of a flat bed without using bedrails?: A Lot Help needed moving to and from a bed to a chair (including a wheelchair)?: A Little Help needed standing up from a chair using your arms (e.g., wheelchair or bedside chair)?: A Lot Help needed to walk in hospital room?: A Little Help needed climbing 3-5 steps with a railing? : A Lot 6 Click Score: 14    End of Session Equipment Utilized During Treatment: Gait belt Activity Tolerance: Patient tolerated treatment well Patient left: in bed;with call bell/phone within reach;with bed alarm set;with family/visitor present;with restraints reapplied Nurse Communication: Mobility status PT Visit Diagnosis: Unsteadiness on feet (R26.81);Muscle weakness (generalized) (M62.81);Repeated falls (R29.6);Other abnormalities of gait and mobility  (R26.89)    Time: 4332-9518 PT Time Calculation (min) (  ACUTE ONLY): 38 min   Charges:   PT Evaluation $PT Eval Moderate Complexity: 1 Mod PT Treatments $Therapeutic Activity: 8-22 mins        Ocie Tino A. Gilford Rile PT, DPT Acute Rehabilitation Services Pager [phone] Office [phone]   Linna Hoff 01/14/2021, 1:50 PM

## 2021-01-15 ENCOUNTER — Inpatient Hospital Stay (HOSPITAL_COMMUNITY): Payer: HMO

## 2021-01-15 DIAGNOSIS — I10 Essential (primary) hypertension: Secondary | ICD-10-CM | POA: Diagnosis not present

## 2021-01-15 DIAGNOSIS — R531 Weakness: Secondary | ICD-10-CM | POA: Diagnosis not present

## 2021-01-15 LAB — BASIC METABOLIC PANEL
Anion gap: 14 (ref 5–15)
BUN: 22 mg/dL (ref 8–23)
CO2: 19 mmol/L — ABNORMAL LOW (ref 22–32)
Calcium: 9.2 mg/dL (ref 8.9–10.3)
Chloride: 99 mmol/L (ref 98–111)
Creatinine, Ser: 1.28 mg/dL — ABNORMAL HIGH (ref 0.61–1.24)
GFR, Estimated: >60 mL/min (ref >60)
Glucose, Bld: 123 mg/dL — ABNORMAL HIGH (ref 70–99)
Potassium: 4.1 mmol/L (ref 3.5–5.1)
Sodium: 132 mmol/L — ABNORMAL LOW (ref 135–145)

## 2021-01-15 LAB — URINALYSIS, ROUTINE W REFLEX MICROSCOPIC
Bacteria, UA: NONE SEEN
Bilirubin Urine: NEGATIVE
Glucose, UA: >=500 mg/dL — AB
Hgb urine dipstick: NEGATIVE
Ketones, ur: 80 mg/dL — AB
Leukocytes,Ua: NEGATIVE
Nitrite: NEGATIVE
Protein, ur: NEGATIVE mg/dL
Specific Gravity, Urine: 1.027 (ref 1.005–1.030)
pH: 5 (ref 5.0–8.0)

## 2021-01-15 LAB — GLUCOSE, CAPILLARY
Glucose-Capillary: 126 mg/dL — ABNORMAL HIGH (ref 70–99)
Glucose-Capillary: 136 mg/dL — ABNORMAL HIGH (ref 70–99)
Glucose-Capillary: 138 mg/dL — ABNORMAL HIGH (ref 70–99)
Glucose-Capillary: 151 mg/dL — ABNORMAL HIGH (ref 70–99)

## 2021-01-15 LAB — URIC ACID: Uric Acid, Serum: 5.7 mg/dL (ref 3.7–8.6)

## 2021-01-15 LAB — SODIUM, URINE, RANDOM: Sodium, Ur: 72 mmol/L

## 2021-01-15 LAB — OSMOLALITY: Osmolality: 292 mOsm/kg (ref 275–295)

## 2021-01-15 LAB — OSMOLALITY, URINE: Osmolality, Ur: 873 mOsm/kg (ref 300–900)

## 2021-01-15 IMAGING — MR MR CERVICAL SPINE W/O CM
4 series · 48 of 48 positions shown · non-contrast
Comparison: CT cervical spine dated [DATE].

CLINICAL DATA: Confusion.  Frequent falls.

EXAM:
MRI CERVICAL SPINE WITHOUT CONTRAST
TECHNIQUE: Multiplanar, multisequence MR imaging of the cervical spine was
performed. No intravenous contrast was administered.

[Series 26: T2 · sagittal · 3.0mm · 0.86mm/px · 8 of 15 slices shown (1 of 2)]
[im 1/15]
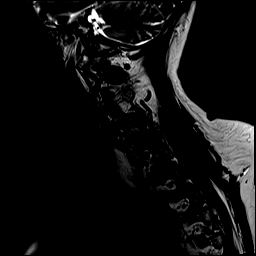
[im 3/15]
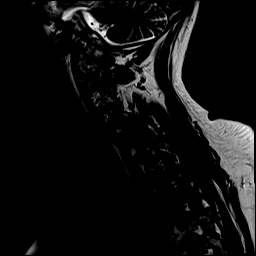
[im 5/15]
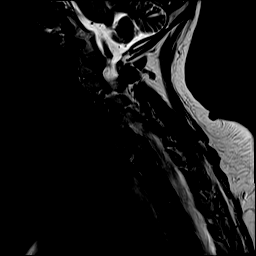
[im 7/15]
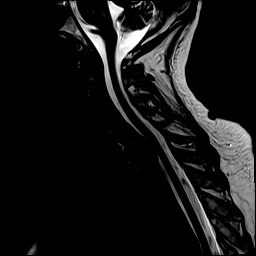
[im 9/15]
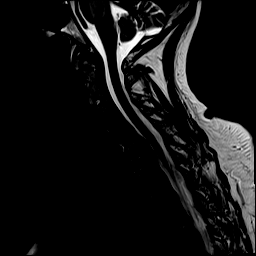
[im 11/15]
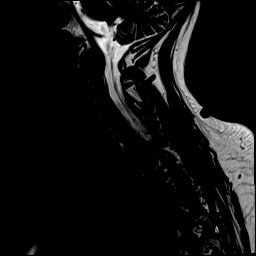
[im 13/15]
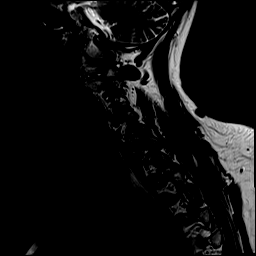
[im 15/15]
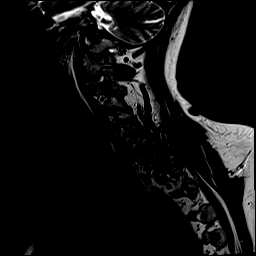

[Series 27: T1 · sagittal · 3.0mm · 0.69mm/px · 9 of 15 slices shown]
[im 1/15]
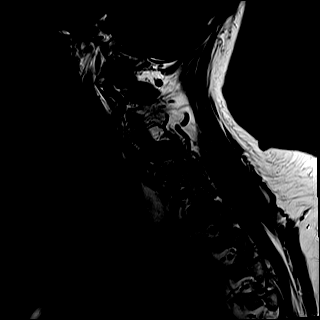
[im 2/15]
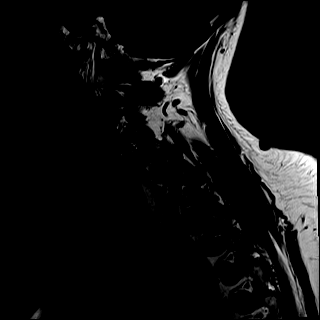
[im 4/15]
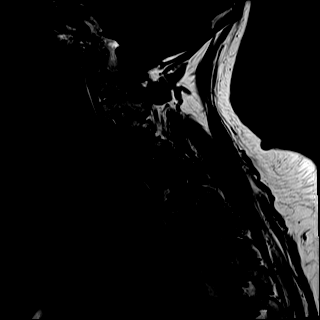
[im 6/15]
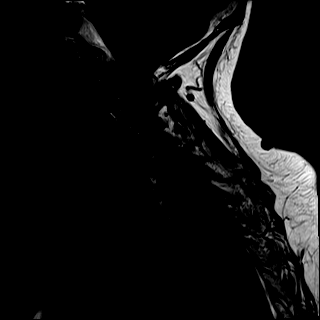
[im 8/15]
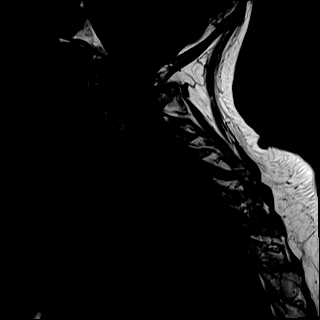
[im 9/15]
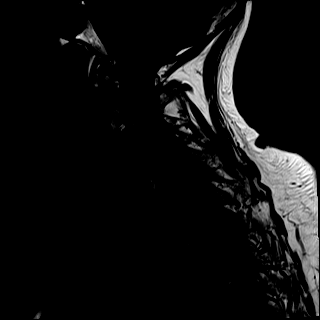
[im 11/15]
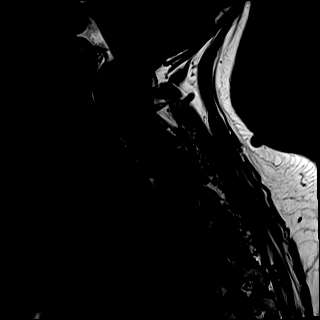
[im 13/15]
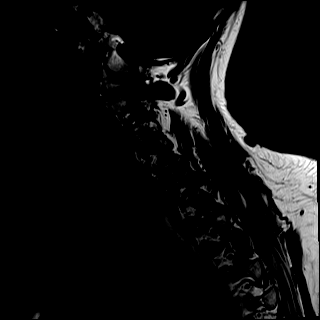
[im 15/15]
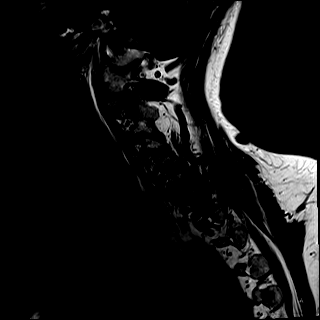

[Series 28: STIR · sagittal · 3.0mm · 0.86mm/px · 9 of 15 slices shown]
[im 1/15]
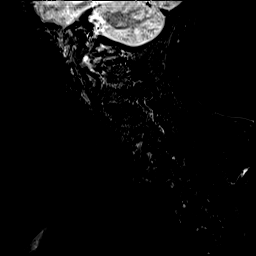
[im 2/15]
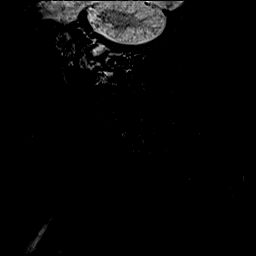
[im 4/15]
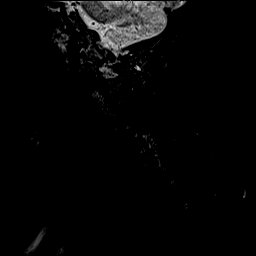
[im 6/15]
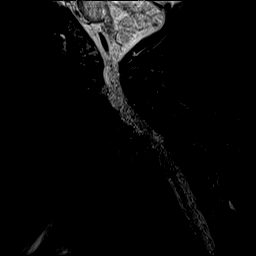
[im 8/15]
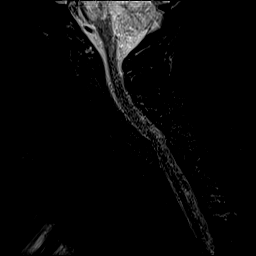
[im 9/15]
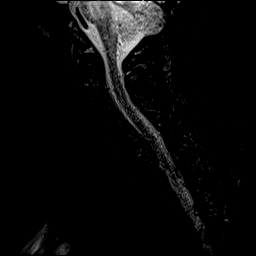
[im 11/15]
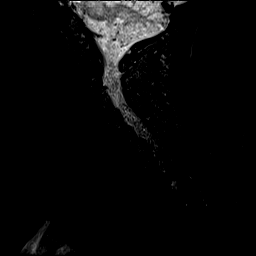
[im 13/15]
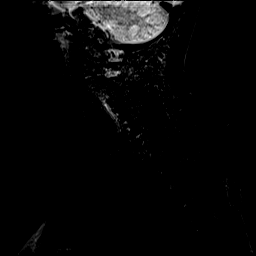
[im 15/15]
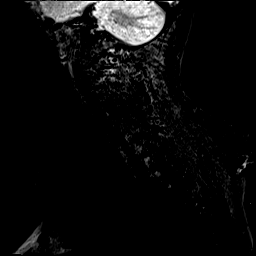

[Series 29: T2 · axial · 3.0mm · 0.78mm/px · z∈[-260,-156]mm · 22 of 38 slices shown (2 of 2)]
[im 1/38]
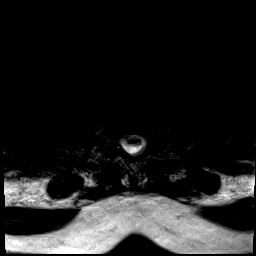
[im 2/38]
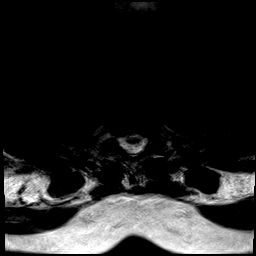
[im 4/38]
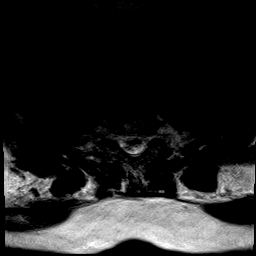
[im 6/38]
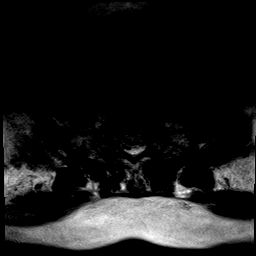
[im 8/38]
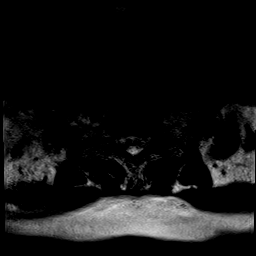
[im 9/38]
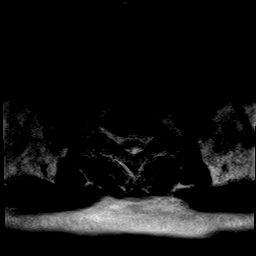
[im 11/38]
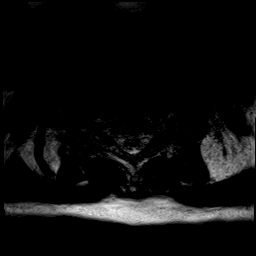
[im 13/38]
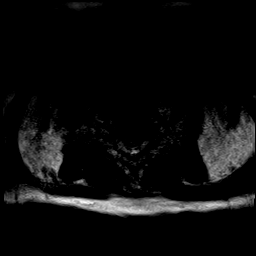
[im 15/38]
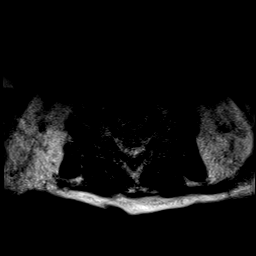
[im 16/38]
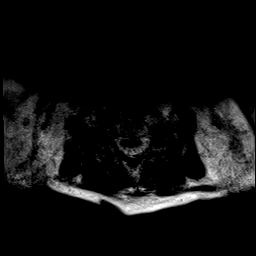
[im 18/38]
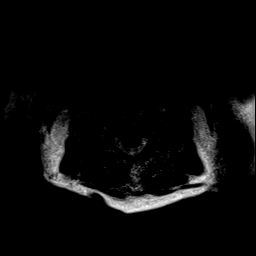
[im 20/38]
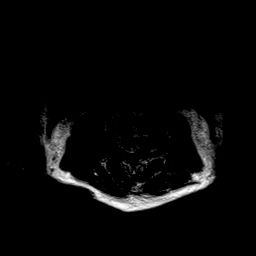
[im 22/38]
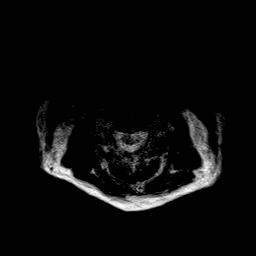
[im 23/38]
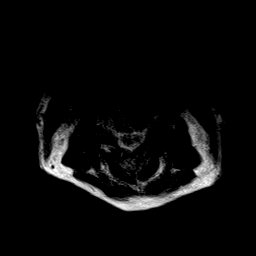
[im 25/38]
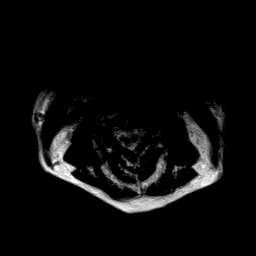
[im 27/38]
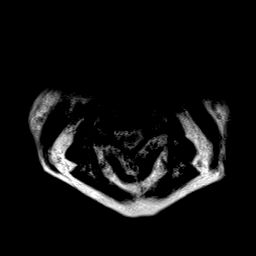
[im 29/38]
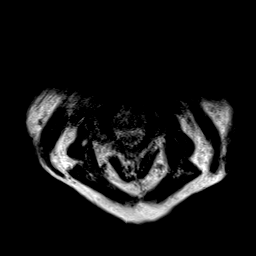
[im 30/38]
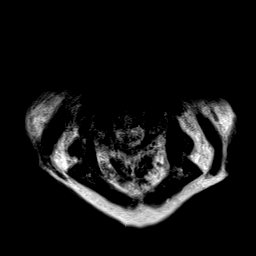
[im 32/38]
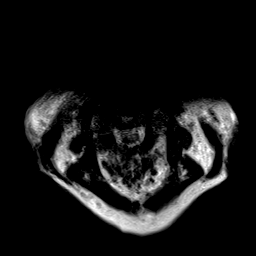
[im 34/38]
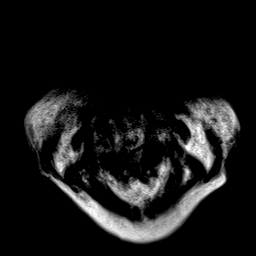
[im 36/38]
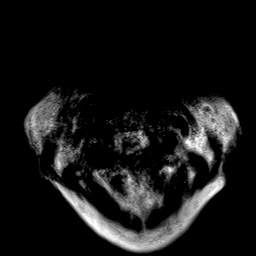
[im 38/38]
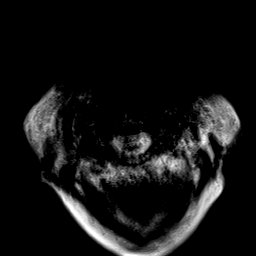

[48 of 48 positions shown; findings below may reference images not displayed]

FINDINGS: Despite efforts by the technologist and patient, motion artifact is
present on today's exam and could not be eliminated. This reduces
exam sensitivity and specificity. The examination was also ended
early and the axial medic images were not obtained.

Alignment: Unchanged reversal of the normal cervical lordosis and
trace anterolisthesis at C5-C6.

Vertebrae: No fracture, evidence of discitis, or bone lesion.

Cord: Normal signal and morphology.

Posterior Fossa, vertebral arteries, paraspinal tissues: Chronic
microvascular ischemic changes in the pons. Otherwise negative.

Disc levels:

C2-C3:  Interbody and facet ankylosis.  No stenosis.

C3-C4: No significant disc bulge or herniation. Mild-to-moderate
bilateral facet arthropathy. Moderate right uncovertebral
hypertrophy. Moderate right neuroforaminal stenosis. No spinal canal
or left neuroforaminal stenosis.

C4-C5: No significant disc bulge or herniation. Mild bilateral
uncovertebral hypertrophy. Moderate left and mild right facet
arthropathy. No stenosis.

C5-C6: Severe disc height loss without significant disc bulge or
herniation. Moderate bilateral facet uncovertebral hypertrophy. Mild
bilateral neuroforaminal stenosis. No spinal canal stenosis.

C6-C7: Mild disc bulging. Moderate bilateral uncovertebral
hypertrophy. No stenosis.

C7-T1: Negative disc. Moderate right and mild left facet
arthropathy. No stenosis.

The visualized upper thoracic spine is unremarkable.
IMPRESSION: 1. Normal cervical spinal cord.
2. Multilevel degenerative changes of the cervical spine as
described above. No spinal canal stenosis at any level.
3. Moderate right neuroforaminal stenosis at C3-C4.

## 2021-01-15 IMAGING — MR MR HEAD W/O CM
11 of 12 series · 44 of 48 positions shown · non-contrast
Comparison: Head CT from 3 days ago.

CLINICAL DATA: Headache, new or worsening.  Confusion.

EXAM:
MRI HEAD WITHOUT CONTRAST
TECHNIQUE: Multiplanar, multiecho pulse sequences of the brain and surrounding
structures were obtained without intravenous contrast.

[Series 9: DWI · axial · 3.0mm · 0.88mm/px · z∈[-62,+78]mm · 5 of 96 slices shown (1 of 4)]
[im 1/96]
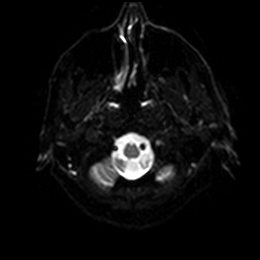
[im 24/96]
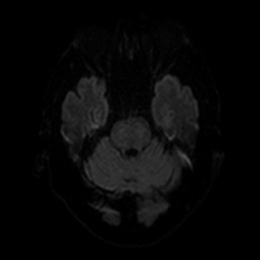
[im 48/96]
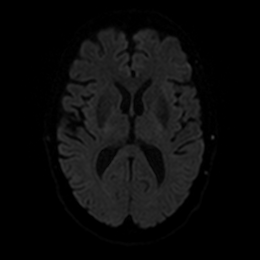
[im 72/96]
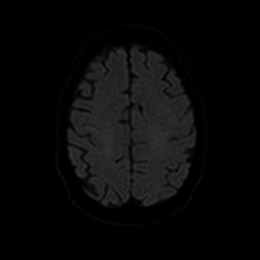
[im 96/96]
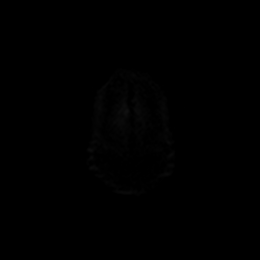

[Series 10: DWI · axial · 3.0mm · 0.88mm/px · z∈[-62,+78]mm · 3 of 48 slices shown (2 of 4)]
[im 1/48]
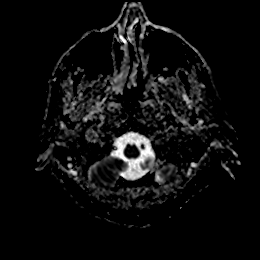
[im 24/48]
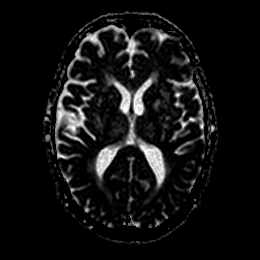
[im 48/48]
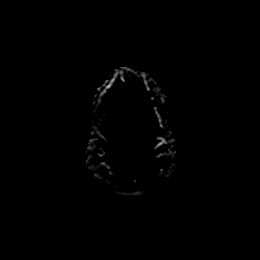

[Series 11: DWI · coronal · 4.0mm · 0.88mm/px · 4 of 64 slices shown (3 of 4)]
[im 1/64]
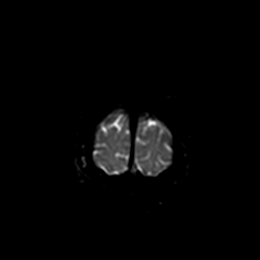
[im 22/64]
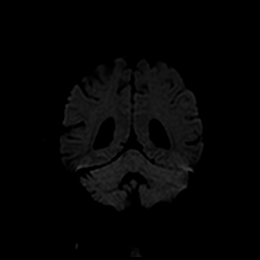
[im 43/64]
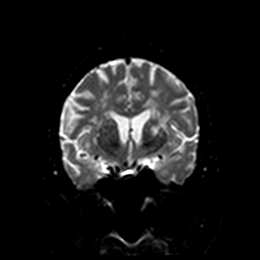
[im 64/64]
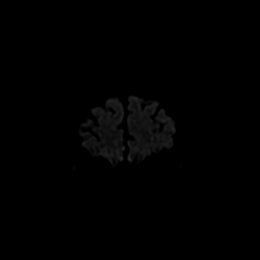

[Series 12: DWI · coronal · 4.0mm · 0.88mm/px · 2 of 32 slices shown (4 of 4)]
[im 1/32]
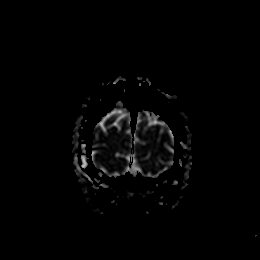
[im 32/32]
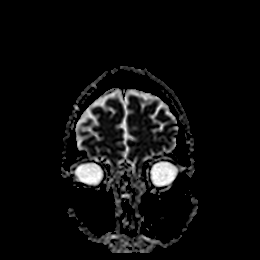

[Series 13: T1 · sagittal · 5.0mm · 0.75mm/px · 2 of 23 slices shown]
[im 1/23]
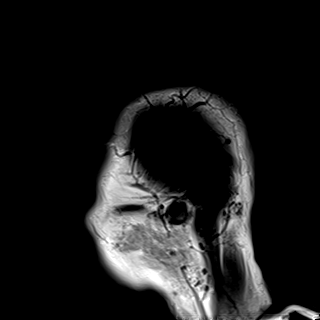
[im 23/23]
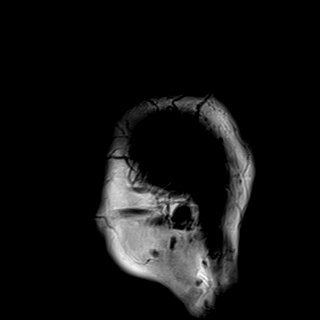

[Series 14: T2 · axial · 5.0mm · 0.72mm/px · z∈[-64,+80]mm · 2 of 25 slices shown]
[im 1/25]
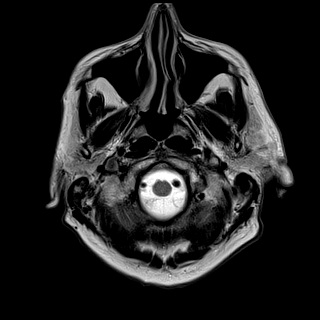
[im 25/25]
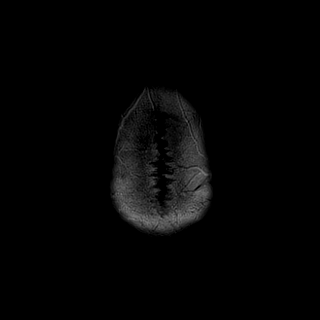

[Series 15: FLAIR · axial · 5.0mm · 0.45mm/px · z∈[-66,+78]mm · 2 of 25 slices shown]
[im 1/25]
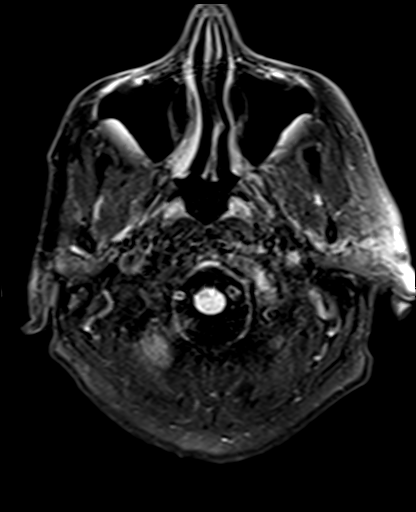
[im 25/25]
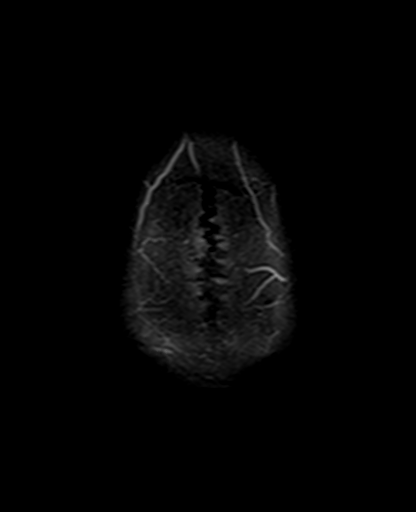

[Series 16: mag_images · axial · 3.0mm · 0.90mm/px · z∈[-82,+94]mm · 4 of 60 slices shown]
[im 1/60]
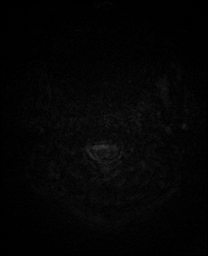
[im 20/60]
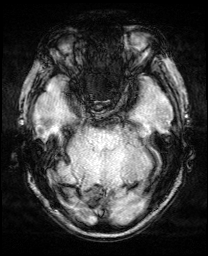
[im 40/60]
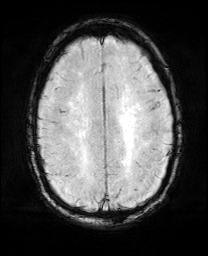
[im 60/60]
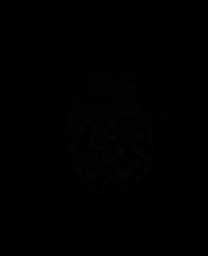

[Series 18: swi_images · axial · 3.0mm · 0.90mm/px · z∈[-82,+94]mm · 4 of 60 slices shown]
[im 1/60]
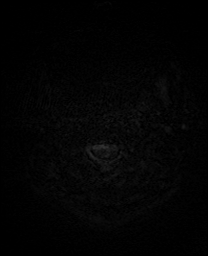
[im 20/60]
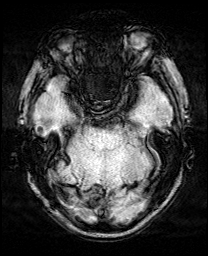
[im 40/60]
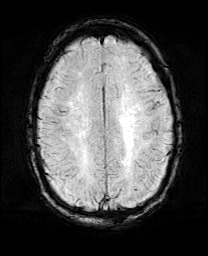
[im 60/60]
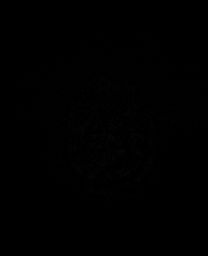

[Series 19: mip_images(sw) · axial · 24.0mm · 0.90mm/px · z∈[-72,+84]mm · 4 of 53 slices shown]
[im 1/53]
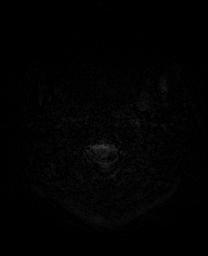
[im 18/53]
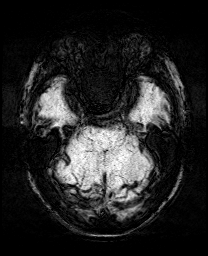
[im 35/53]
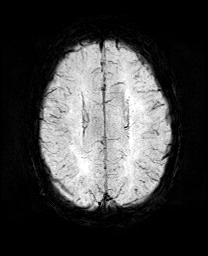
[im 53/53]
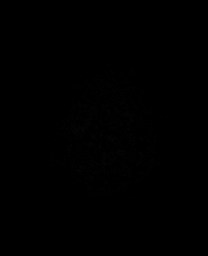

[Series 21: t1_mprage_tra_p2_iso · axial · 1.0mm · 0.98mm/px · z∈[-79,+95]mm · 12 of 174 slices shown]
[im 1/174]
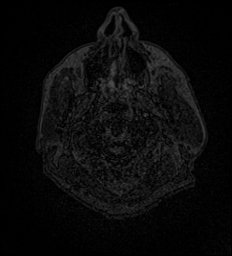
[im 16/174]
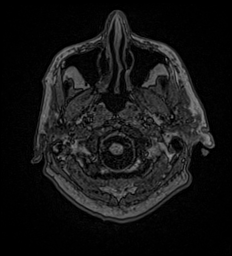
[im 32/174]
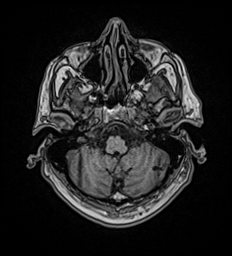
[im 48/174]
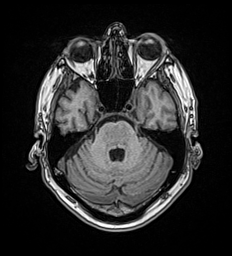
[im 63/174]
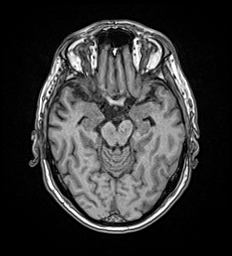
[im 79/174]
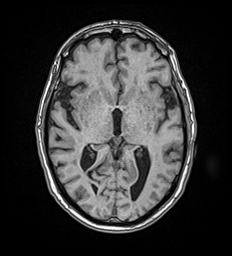
[im 95/174]
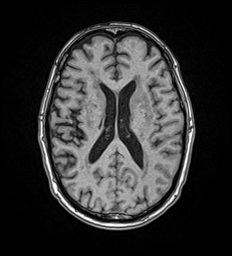
[im 111/174]
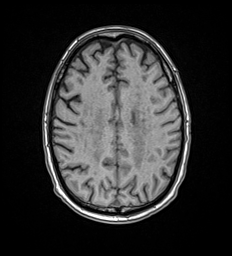
[im 126/174]
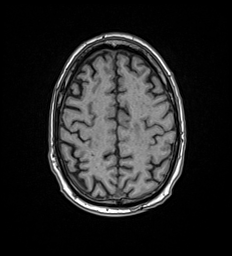
[im 142/174]
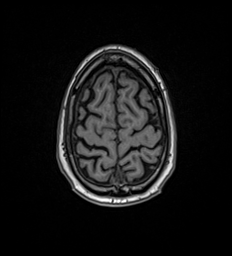
[im 158/174]
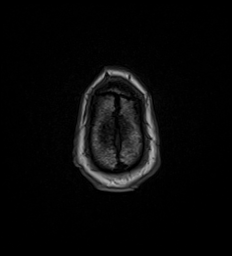
[im 174/174]
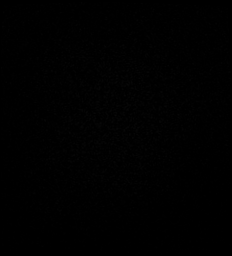

[44 of 48 positions shown; findings below may reference images not displayed]

FINDINGS: Brain: Subcentimeter focus of weakly restricted diffusion in the
parasagittal and subcortical right parietal lobe. Background of
chronic small vessel ischemia with generalized ischemic gliosis in
the cerebral white matter and pons. Small remote left cerebellar
infarct. No masslike finding, hydrocephalus, atrophy, or collection.

Vascular: Normal flow voids.

Skull and upper cervical spine: Separate cervical MRI. No focal
marrow lesion.

Sinuses/Orbits: Negative

Other: Truncated study due to patient confusion.
IMPRESSION: 1. Small subacute infarct in the right parietal white matter, likely
a current manifestation of the patient's extensive chronic small
vessel ischemia.
2. No reversible finding.

## 2021-01-15 MED ORDER — LACTATED RINGERS IV SOLN: INTRAVENOUS | Status: DC

## 2021-01-15 MED ORDER — LORAZEPAM 2 MG/ML IJ SOLN
2.0000 mg | Freq: Once | INTRAMUSCULAR | Status: AC
  Administered 2021-01-15: 2 mg via INTRAVENOUS
  Filled 2021-01-15: qty 1

## 2021-01-15 MED ORDER — GADOBUTROL 1 MMOL/ML IV SOLN
7.5000 mL | Freq: Once | INTRAVENOUS | Status: AC | PRN
  Administered 2021-01-15: 7.5 mL via INTRAVENOUS

## 2021-01-15 NOTE — Progress Notes (Signed)
Neurology Progress Note Subjective: No acute overnight events Persistent agitation requiring Haldol Wife states that the patient's dysarthria has been present and progressive for 1 - 2 months but she thought he was not speaking clearly to aggravate her. She states that his current speech is worse than usual.  Patient's wife also states that the confusion has been progressive over the past 3 weeks with upwards of 15 - 17 falls over that time-span. Patient's wife feels that he is slightly more agitated today than when she visited him yesterday.   Exam: Vitals:   01/15/21 0328 01/15/21 0750  BP: 125/81 139/86  Pulse: 82 86  Resp: 18 18  Temp: (!) 97.3 F (36.3 C) 97.7 F (36.5 C)  SpO2: 97% 100%   Gen: In bed, wife at bedside, in no acute distress. Slightly restless and agitated.  Resp: non-labored breathing, no respiratory distress Abd: soft, non-distended, non-tender  Neuro: Mental Status: Patient is awake, mildly agitated on examination.  He states his name, month, and year correctly. He has poor attention. States that he does not need to be in the hospital and insists that he is in perfect health and that he "is not stupid" during mental status and physical examination.  Speech is severely dysarthric and mostly unintelligible. Examiner is able to make out some sentences once repeated. Patient's wife states his speech has become progressively more dysarthric over a span of 1 - 2 months and is worse now than PTA. Patient is edentulous.  Cranial Nerves: Pupils are 4 mm, equal, round, and reactive to light bilaterally, visual fields are full, he will fixate and track examiner, facial sensation is intact and symmetric to light touch bilaterally, face appears symmetric resting and smiling, hearing is intact to voice, phonation normal, symmetric palate rise, shoulder shrug symmetric, tongue protrudes midline. Motor: 5/5 strength of bilateral upper extremities, 4/5 strength of bilateral lower  extremities. Will raise lower extremities to command but drops them quickly due to poor attention.  Tone and bulk are normal. Sensory: Sensation to light touch intact and symmetric in bilateral upper and lower extremities. Gait: Deferred  Pertinent Labs: CBC    Component Value Date/Time   WBC 9.6 01/14/2021 0327   RBC 5.39 01/14/2021 0327   HGB 16.5 01/14/2021 0327   HCT 48.7 01/14/2021 0327   PLT 238 01/14/2021 0327   MCV 90.4 01/14/2021 0327   MCH 30.6 01/14/2021 0327   MCHC 33.9 01/14/2021 0327   RDW 13.1 01/14/2021 0327   LYMPHSABS 2.6 05/12/2016 1326   MONOABS 0.9 05/12/2016 1326   EOSABS 0.2 05/12/2016 1326   BASOSABS 0.0 05/12/2016 1326   CMP     Component Value Date/Time   NA 132 (L) 01/15/2021 0221   K 4.1 01/15/2021 0221   CL 99 01/15/2021 0221   CO2 19 (L) 01/15/2021 0221   GLUCOSE 123 (H) 01/15/2021 0221   BUN 22 01/15/2021 0221   CREATININE 1.28 (H) 01/15/2021 0221   CALCIUM 9.2 01/15/2021 0221   PROT 7.3 01/14/2021 0327   ALBUMIN 4.3 01/14/2021 0327   AST 21 01/14/2021 0327   ALT 15 01/14/2021 0327   ALKPHOS 103 01/14/2021 0327   BILITOT 2.1 (H) 01/14/2021 0327   GFRNONAA >60 01/15/2021 0221   GFRAA >60 08/10/2016 0417   Lab Results  Component Value Date   VITAMINB12 72 (L) 01/12/2021   Drugs of Abuse     Component Value Date/Time   LABOPIA NONE DETECTED 01/13/2021 [place] DETECTED 01/13/2021 3419  LABBENZ POSITIVE (A) 01/13/2021 0614   AMPHETMU NONE DETECTED 01/13/2021 0614   THCU POSITIVE (A) 01/13/2021 0614   LABBARB NONE DETECTED 01/13/2021 0614    Urinalysis    Component Value Date/Time   COLORURINE YELLOW 01/13/2021 [place] 01/13/2021 0614   LABSPEC 1.010 01/13/2021 0614   PHURINE 7.0 01/13/2021 0614   GLUCOSEU NEGATIVE 01/13/2021 0614   HGBUR NEGATIVE 01/13/2021 0614   BILIRUBINUR NEGATIVE 01/13/2021 [place] 01/13/2021 0614   PROTEINUR NEGATIVE 01/13/2021 0614   UROBILINOGEN  0.2 01/06/2014 1257   NITRITE NEGATIVE 01/13/2021 0614   LEUKOCYTESUR NEGATIVE 01/13/2021 1610   Lab Results  Component Value Date   TSH 2.095 01/12/2021   Lab Results  Component Value Date   HGBA1C 7.2 (H) 01/12/2021   RPR: Non reactive HIV: Non reactive  Flu and SARS: negative   Imaging I have reviewed images in epic and the results pertinent to this consultation are: CT head chronic microvascular disease without acute process CT C-spine severe C6 degenerative changes without clear acute process.  Echocardiogram 4/8: 1. LVEF, by estimation, is 45 to 50%. The  left ventricle has mildly decreased function. The left ventricle demonstrates regional wall motion abnormalities. Apical akinesis. Left ventricular diastolic parameters are consistent with Grade I  diastolic dysfunction (impaired relaxation).  2. Swirling of contrast at apex consistent with low flow but no thrombus seen  3. Right ventricular systolic function is normal. The right ventricular size is normal. Tricuspid regurgitation signal is inadequate for assessing PA pressure.  4. The mitral valve is normal in structure. No evidence of mitral valve regurgitation.  5. The aortic valve is tricuspid. Aortic valve regurgitation is trivial. Mild to moderate aortic valve sclerosis/calcification is present, without any evidence of aortic stenosis.  6. The inferior vena cava is normal in size with greater than 50%  respiratory variability, suggesting right atrial pressure of 3 mmHg.   MRI Brain: IMPRESSION: 1. Small subacute infarct in the right parietal white matter, likely a current manifestation of the patient's extensive chronic small vessel ischemia. 2. No reversible finding.  MRI C-Spine: 1. Normal cervical spinal cord. 2. Multilevel degenerative changes of the cervical spine as described above. No spinal canal stenosis at any level. 3. Moderate right neuroforaminal stenosis at C3-C4.  Assessment:70 year old man  with frequent falls and hallucinations as well as new onset headaches. - MR imaging with small subacute infarct in the right parietal white matter with extensive chronic small vessel ischemia. Presentation not explained by MRI findings and the stroke is likely incidental-his clinical picture is more consistent with Vitamin B12 deficiency (falls, bilateral LE neuropathy, confusion, dysarthria) - C-spine imaging for possible myelopathy. - Lab work also revealed his B12 to be extremely low at 72. I suspect that he has peripheral neuropathy (leading to gait disturbances ) and cognitive changes secondary to severely decreased levels of B12.  - DDx includes other contributing factors including probably polypharmacy, underlying cognitive decline from dementia, use of marijuana, and less likely stroke. Of note, mental status has improved with initiation of Seroquel. - Stroke-has multiple stroke risk factors: age, left ventricular apical akinesis on echocardiogram, CAD with LAD stent in 2007, hypertension, hyperlipidemia, diabetes, and remote history of tobacco use. He is on ASA 81 mg PO daily for stroke prophylaxis.  Recommendations: - B12 and thiamine replenishment - Minimize use of sedating medications as much as possible - Continue Seroquel, may increase BID dosing as appropriate.  - PT OT evaluation -Continue aspirin -  Cardiology follow up outpatient due to abnormal echo-discussed with primary team  Stroke on MRI is incidental.  No further work-up required. Echo has already been done. A1c 7.2 (goal <7.2). LDL 60 (at goal)  - Please call with further questions or concerns.  Anibal Henderson, AGACNP-BC [facility] [phone]   Attending Neurohospitalist Addendum Patient seen and examined with APP/Resident. Agree with the history and physical as documented above. Agree with the plan as documented, which I helped formulate. I have independently reviewed the chart, obtained history,  review of systems and examined the patient.I have personally reviewed pertinent head/neck/spine imaging (CT/MRI). Please feel free to call with any questions. Plan discussed with Dr. Candiss Norse. --- Amie Portland, MD [facility] Pager: [phone]  If 7pm to 7am, please call on call as listed on AMION.

## 2021-01-15 NOTE — Progress Notes (Addendum)
PROGRESS NOTE        PATIENT DETAILS Name: Vincent [name] Age: 70 y.o. Sex: male Date of Birth: [birth year] Admit Date: 01/12/2021 Admitting Physician Lequita Halt, MD GUR:KYHC, Emily Filbert., MD  Brief Narrative: Patient is a 70 y.o. male with history of insulin-dependent DM-2, HTN, CAD, chronic systolic heart failure, chronic pain syndrome on narcotics/benzos-presented with a 2-week history of worsening confusion, visual hallucinations and frequent falls.  Spouse reports slurred speech for a few days as well.  Patient was subsequently admitted to the hospitalist service for further evaluation and treatment.  Significant events: 4/7>> admit for evaluation of slurred speech, confusion, hallucination, frequent falls  Significant studies: 4/7>> CT head: Negative for acute abnormalities 4/7>> CT C-spine: No fracture 4/7>> x-ray right knee: Right knee replacement without hardware loosening or fracture 4/8>> Echo: EF 45-50%, apical akinesis, grade 1 diastolic dysfunction.  Swirling of contrast at apex consistent with low flow but no thrombus, +ve WMA. 4/8>> vitamin B12: 72  Antimicrobial therapy: None  Microbiology data: None  Procedures : None  Consults: Neurology  DVT Prophylaxis : enoxaparin (LOVENOX) injection 40 mg Start: 01/13/21 1000   Subjective:   Patient in bed, in no distress eating breakfast, minimally confused, no headache chest or abdominal pain.  No focal weakness  Assessment/Plan:  Encephalopathy/slurred speech/visual hallucinations: Probable polypharmacy-but need to rule out CVA due to slurred speech-echo findings (smoke seen)-vitamin B12 deficiency may be contributing as well-continue to hold benzos/narcotics-increased Seroquel to 50 mg twice daily-use Haldol as needed.  Clinically improving, MRI brain pending.  Neurology following.   Vitamin B12 deficiency: On B12 supplementation-recheck vitamin B12 levels in 3 months.  Combined  chronic systolic and diastolic heart failure: Euvolemic-since slightly dehydrated on 01/15/2021 hold further diuretics and hydrate.  CAD-s/p PCI to LAD in 2007: No anginal symptoms-has apical akinesis on echo-most recent nuclear stress test in 2017 showed a fixed defect in the apex stable for outpatient follow-up with primary cardiologist.  Remains on aspirin. Will need outpt Cards follow up in 7-10 days post DC.  HTN: Blood pressure controlled-continue present regimen.  Holding ACE inhibitor due to AKI.  HLD: Continue statin-LDL 60.  Mild clinical dehydration with hyponatremia and AKI.  Hydrate with IV fluids and monitor.    DM-2 (A1c 7.2 on 4/7): CBG stable with SSI-hold all oral hypoglycemic agents  Recent Labs    01/14/21 1613 01/14/21 2126 01/15/21 0606  GLUCAP 124* 126* 138*    Obesity: Estimated body mass index is 34.21 kg/m as calculated from the following:   Height as of 07/26/20: 5\' 8"  (1.727 m).   Weight as of 07/26/20: 102.1 kg.   Diet: Diet Order            Diet heart healthy/carb modified Room service appropriate? Yes; Fluid consistency: Thin  Diet effective now                  Code Status: Full code   Family Communication: [phone] updated on 01/14/21, message left at 10 AM on 01/15/2021  Disposition Plan: Status is: Inpatient  Remains inpatient appropriate because:Inpatient level of care appropriate due to severity of illness   Dispo: The patient is from: Home              Anticipated d/c is to: Home  Patient currently is not medically stable to d/c.   Difficult to place patient No   Barriers to Discharge: Ongoing altered mental status-awaiting further work-up-see above  Antimicrobial agents: Anti-infectives (From admission, onward)   None       Time spent: 35 minutes-Greater than 50% of this time was spent in counseling, explanation of diagnosis, planning of further management, and coordination of  care.  MEDICATIONS: Scheduled Meds: .  stroke: mapping our early stages of recovery book   Does not apply Once  . aspirin EC  81 mg Oral Daily  . cyanocobalamin  1,000 mcg Subcutaneous Daily  . enoxaparin (LOVENOX) injection  40 mg Subcutaneous Q24H  . insulin aspart  0-15 Units Subcutaneous TID WC  . isosorbide mononitrate  30 mg Oral Daily  . metoprolol tartrate  50 mg Oral BID  . QUEtiapine  50 mg Oral BID  . simvastatin  40 mg Oral QHS   Continuous Infusions: PRN Meds:.acetaminophen **OR** [DISCONTINUED] acetaminophen (TYLENOL) oral liquid 160 mg/5 mL **OR** [DISCONTINUED] acetaminophen, haloperidol lactate, LORazepam, senna-docusate   PHYSICAL EXAM: Vital signs: Vitals:   01/14/21 1917 01/15/21 0035 01/15/21 0328 01/15/21 0750  BP: 133/74 121/73 125/81 139/86  Pulse: 89 76 82 86  Resp: 16 18 18 18   Temp: 98.4 F (36.9 C) 99.3 F (37.4 C) (!) 97.3 F (36.3 C) 97.7 F (36.5 C)  TempSrc: Axillary  Oral Oral  SpO2: 96% 95% 97% 100%   There were no vitals filed for this visit. There is no height or weight on file to calculate BMI.   Gen Exam:   Awake mild confusion, moving all 4 extremities, speech much improved, [place].AT,PERRAL Supple Neck,No JVD, No cervical lymphadenopathy appriciated.  Symmetrical Chest wall movement, Good air movement bilaterally, CTAB RRR,No Gallops, Rubs or new Murmurs, No Parasternal Heave +ve B.Sounds, Abd Soft, No tenderness, No organomegaly appriciated, No rebound - guarding or rigidity. No Cyanosis, Clubbing or edema, No new Rash or bruise   I have personally reviewed following labs and imaging studies  LABORATORY DATA: CBC: Recent Labs  Lab 01/12/21 1548 01/14/21 0327  WBC 9.6 9.6  HGB 14.2 16.5  HCT 42.2 48.7  MCV 91.7 90.4  PLT 246 903    Basic Metabolic Panel: Recent Labs  Lab 01/12/21 1548 01/14/21 0327 01/15/21 0221  NA 136 138 132*  K 4.2 4.1 4.1  CL 101 104 99  CO2 28 20* 19*  GLUCOSE 125* 130* 123*  BUN 9 15  22   CREATININE 0.96 1.12 1.28*  CALCIUM 9.4 9.8 9.2    GFR: CrCl cannot be calculated (Unknown ideal weight.).  Liver Function Tests: Recent Labs  Lab 01/12/21 1548 01/14/21 0327  AST 16 21  ALT 13 15  ALKPHOS 96 103  BILITOT 0.7 2.1*  PROT 6.1* 7.3  ALBUMIN 3.8 4.3   No results for input(s): LIPASE, AMYLASE in the last 168 hours. Recent Labs  Lab 01/12/21 2221  AMMONIA 11    Coagulation Profile: No results for input(s): INR, PROTIME in the last 168 hours.  Cardiac Enzymes: No results for input(s): CKTOTAL, CKMB, CKMBINDEX, TROPONINI in the last 168 hours.  BNP (last 3 results) No results for input(s): PROBNP in the last 8760 hours.  Lipid Profile: Recent Labs    01/12/21 2221  CHOL 118  HDL 46  LDLCALC 60  TRIG 61  CHOLHDL 2.6    Thyroid Function Tests: Recent Labs    01/12/21 2221  TSH 2.095    Anemia Panel: Recent Labs  01/12/21 2221  VITAMINB12 72*    Urine analysis:    Component Value Date/Time   COLORURINE YELLOW 01/13/2021 [place] 01/13/2021 0614   LABSPEC 1.010 01/13/2021 0614   PHURINE 7.0 01/13/2021 0614   GLUCOSEU NEGATIVE 01/13/2021 0614   HGBUR NEGATIVE 01/13/2021 [place] 01/13/2021 [place] 01/13/2021 0614   PROTEINUR NEGATIVE 01/13/2021 0614   UROBILINOGEN 0.2 01/06/2014 1257   NITRITE NEGATIVE 01/13/2021 0614   LEUKOCYTESUR NEGATIVE 01/13/2021 0614    Sepsis Labs: Lactic Acid, Venous No results found for: LATICACIDVEN  MICROBIOLOGY: Recent Results (from the past 240 hour(s))  Resp Panel by RT-PCR (Flu A&B, Covid) Nasopharyngeal Swab     Status: None   Collection Time: 01/12/21  4:19 PM   Specimen: Nasopharyngeal Swab; Nasopharyngeal(NP) swabs in vial transport medium  Result Value Ref Range Status   SARS Coronavirus 2 by RT PCR NEGATIVE NEGATIVE Final    Comment: (NOTE) SARS-CoV-2 target nucleic acids are NOT DETECTED.  The SARS-CoV-2 RNA is generally  detectable in upper respiratory specimens during the acute phase of infection. The lowest concentration of SARS-CoV-2 viral copies this assay can detect is 138 copies/mL. A negative result does not preclude SARS-Cov-2 infection and should not be used as the sole basis for treatment or other patient management decisions. A negative result may occur with  improper specimen collection/handling, submission of specimen other than nasopharyngeal swab, presence of viral mutation(s) within the areas targeted by this assay, and inadequate number of viral copies(<138 copies/mL). A negative result must be combined with clinical observations, patient history, and epidemiological information. The expected result is Negative.  Fact Sheet for Patients:  EntrepreneurPulse.com.au  Fact Sheet for Healthcare Providers:  IncredibleEmployment.be  This test is no t yet approved or cleared by the [place] FDA and  has been authorized for detection and/or diagnosis of SARS-CoV-2 by FDA under an Emergency Use Authorization (EUA). This EUA will remain  in effect (meaning this test can be used) for the duration of the COVID-19 declaration under Section 564(b)(1) of the Act, 21 U.S.C.section 360bbb-3(b)(1), unless the authorization is terminated  or revoked sooner.       Influenza A by PCR NEGATIVE NEGATIVE Final   Influenza B by PCR NEGATIVE NEGATIVE Final    Comment: (NOTE) The Xpert Xpress SARS-CoV-2/FLU/RSV plus assay is intended as an aid in the diagnosis of influenza from Nasopharyngeal swab specimens and should not be used as a sole basis for treatment. Nasal washings and aspirates are unacceptable for Xpert Xpress SARS-CoV-2/FLU/RSV testing.  Fact Sheet for Patients: EntrepreneurPulse.com.au  Fact Sheet for Healthcare Providers: IncredibleEmployment.be  This test is not yet approved or cleared by the [place] FDA  and has been authorized for detection and/or diagnosis of SARS-CoV-2 by FDA under an Emergency Use Authorization (EUA). This EUA will remain in effect (meaning this test can be used) for the duration of the COVID-19 declaration under Section 564(b)(1) of the Act, 21 U.S.C. section 360bbb-3(b)(1), unless the authorization is terminated or revoked.  Performed at [facility], [place] [address] [place], [place] [address]     RADIOLOGY STUDIES/RESULTS: ECHOCARDIOGRAM COMPLETE  Result Date: 01/13/2021    ECHOCARDIOGRAM REPORT   Patient Name:   Vincent [name] Date of Exam: 01/13/2021 Medical Rec #:  [id number]      Height:       68.0 in Accession #:    [id number]     Weight:  225.0 lb Date of Birth:  06/07/1951     BSA:          2.149 m Patient Age:    50 years       BP:           126/72 mmHg Patient Gender: M              HR:           96 bpm. Exam Location:  Inpatient Procedure: 2D Echo, Intracardiac Opacification Agent, Cardiac Doppler and Color            Doppler Indications:    TIA  History:        Patient has no prior history of Echocardiogram examinations. CAD                 and Acute MI; Risk Factors:Hypertension, Dyslipidemia and                 Diabetes.  Sonographer:    Luisa Hart RDCS Referring Phys: [id number] Lequita Halt  Sonographer Comments: Suboptimal apical window. IMPRESSIONS  1. Left ventricular ejection fraction, by estimation, is 45 to 50%. The left ventricle has mildly decreased function. The left ventricle demonstrates regional wall motion abnormalities (see scoring diagram/findings for description). Apical akinesis. Left ventricular diastolic parameters are consistent with Grade I diastolic dysfunction (impaired relaxation).  2. Swirling of contrast at apex consistent with low flow but no thrombus seen  3. Right ventricular systolic function is normal. The right ventricular size is normal. Tricuspid regurgitation signal is inadequate for assessing PA pressure.  4. The mitral  valve is normal in structure. No evidence of mitral valve regurgitation.  5. The aortic valve is tricuspid. Aortic valve regurgitation is trivial. Mild to moderate aortic valve sclerosis/calcification is present, without any evidence of aortic stenosis.  6. The inferior vena cava is normal in size with greater than 50% respiratory variability, suggesting right atrial pressure of 3 mmHg. FINDINGS  Left Ventricle: Left ventricular ejection fraction, by estimation, is 45 to 50%. The left ventricle has mildly decreased function. The left ventricle demonstrates regional wall motion abnormalities. Definity contrast agent was given IV to delineate the left ventricular endocardial borders. The left ventricular internal cavity size was normal in size. There is no left ventricular hypertrophy. Left ventricular diastolic parameters are consistent with Grade I diastolic dysfunction (impaired relaxation).  LV Wall Scoring: The entire apex is akinetic. The anterior wall, antero-lateral wall, anterior septum, inferior wall, posterior wall, mid inferoseptal segment, and basal inferoseptal segment are normal. Right Ventricle: The right ventricular size is normal. No increase in right ventricular wall thickness. Right ventricular systolic function is normal. Tricuspid regurgitation signal is inadequate for assessing PA pressure. Left Atrium: Left atrial size was normal in size. Right Atrium: Right atrial size was normal in size. Pericardium: There is no evidence of pericardial effusion. Mitral Valve: The mitral valve is normal in structure. No evidence of mitral valve regurgitation. Tricuspid Valve: The tricuspid valve is normal in structure. Tricuspid valve regurgitation is trivial. Aortic Valve: The aortic valve is tricuspid. Aortic valve regurgitation is trivial. Aortic regurgitation PHT measures 446 msec. Mild to moderate aortic valve sclerosis/calcification is present, without any evidence of aortic stenosis. Aortic valve mean  gradient measures 4.0 mmHg. Aortic valve peak gradient measures 7.4 mmHg. Aortic valve area, by VTI measures 4.43 cm. Pulmonic Valve: The pulmonic valve was not well visualized. Pulmonic valve regurgitation is not visualized. Aorta: The aortic root and ascending aorta  are structurally normal, with no evidence of dilitation. Venous: The inferior vena cava is normal in size with greater than 50% respiratory variability, suggesting right atrial pressure of 3 mmHg. IAS/Shunts: The interatrial septum was not well visualized.  LEFT VENTRICLE PLAX 2D LVIDd:         5.10 cm  Diastology LVIDs:         4.00 cm  LV e' medial:    4.79 cm/s LV PW:         0.70 cm  LV E/e' medial:  14.5 LV IVS:        0.70 cm  LV e' lateral:   9.14 cm/s LVOT diam:     2.70 cm  LV E/e' lateral: 7.6 LV SV:         110 LV SV Index:   51 LVOT Area:     5.73 cm  RIGHT VENTRICLE RV S prime:     9.79 cm/s  PULMONARY VEINS TAPSE (M-mode): 2.1 cm     A Reversal Duration: 100.00 msec                            A Reversal Velocity: 29.70 cm/s                            Diastolic Velocity:  98.33 cm/s                            S/D Velocity:        1.40                            Systolic Velocity:   82.50 cm/s LEFT ATRIUM             Index LA diam:        2.80 cm 1.30 cm/m LA Vol (A2C):   36.8 ml 17.13 ml/m LA Vol (A4C):   17.1 ml 7.96 ml/m LA Biplane Vol: 26.0 ml 12.10 ml/m  AORTIC VALVE                   PULMONIC VALVE AV Area (Vmax):    4.16 cm    PV Vmax:       0.76 m/s AV Area (Vmean):   4.51 cm    PV Vmean:      55.300 cm/s AV Area (VTI):     4.43 cm    PV VTI:        0.140 m AV Vmax:           136.00 cm/s PV Peak grad:  2.3 mmHg AV Vmean:          86.400 cm/s PV Mean grad:  1.0 mmHg AV VTI:            0.248 m AV Peak Grad:      7.4 mmHg AV Mean Grad:      4.0 mmHg LVOT Vmax:         98.80 cm/s LVOT Vmean:        68.000 cm/s LVOT VTI:          0.192 m LVOT/AV VTI ratio: 0.77 AI PHT:            446 msec  AORTA Ao Root diam: 3.50 cm Ao Asc  diam:  3.50 cm MITRAL VALVE MV Area (PHT):  5.13 cm     SHUNTS MV Decel Time: 148 msec     Systemic VTI:  0.19 m MR Peak grad: 61.2 mmHg     Systemic Diam: 2.70 cm MR Vmax:      391.00 cm/s MV E velocity: 69.40 cm/s MV A velocity: 129.00 cm/s MV E/A ratio:  0.54 Oswaldo Milian MD Electronically signed by Oswaldo Milian MD Signature Date/Time: 01/13/2021/11:23:19 AM    Final      LOS: 3 days   Lala Lund, MD  [facility]  01/15/2021, 10:08 AM

## 2021-01-15 NOTE — Evaluation (Signed)
Speech Language Pathology Evaluation Patient Details Name: Vincent [name] MRN: [id number] DOB: [birth year] Today's Date: 01/15/2021 Time: 3154-0086 SLP Time Calculation (min) (ACUTE ONLY): 30 min  Problem List:  Patient Active Problem List   Diagnosis Date Noted   CVA (cerebral vascular accident) ([place]) 01/12/2021   Weakness    Precordial pain    Right knee pain 01/07/2018   History of total right knee replacement 01/07/2018   SOB (shortness of breath) 08/12/2016   Tachycardia 08/12/2016   Low grade fever 08/12/2016   Osteoarthritis of right knee 08/09/2016   OSA (obstructive sleep apnea) 11/29/2011   Syncope 07/24/2011   ARM NUMBNESS 06/19/2010   EDEMA 03/13/2010   FASTING HYPERGLYCEMIA 03/13/2010   CORONARY ATHEROSCLEROSIS NATIVE CORONARY ARTERY 06/20/2009   COLONIC POLYPS, ADENOMATOUS 01/31/2009   HLD (hyperlipidemia) 01/31/2009   Hypertension 01/31/2009   CAD, UNSPECIFIED SITE 01/31/2009   HEMORRHOIDS 01/31/2009   DIVERTICULOSIS, MILD 01/31/2009   Past Medical History:  Past Medical History:  Diagnosis Date   Acute MI ([place])    Allergy    Anxiety    Arthritis    Coronary artery disease    Depression    Diabetes mellitus without complication ([place])    Diverticulosis    mild   Hemorrhoids    HOH (hard of hearing)    Hx of colonic polyps    Hyperlipidemia    Hypertension    Left ventricular dysfunction    hx of   Past Surgical History:  Past Surgical History:  Procedure Laterality Date   ANGIOPLASTY     and bare metal stent placement   ANKLE SURGERY Right 1986   i&d abscess- pt states "put a bone in it"   CORONARY ANGIOPLASTY WITH STENT PLACEMENT     KNEE ARTHROPLASTY Right 08/09/2016   Procedure: RIGHT TOTAL KNEE ARTHROPLASTY WITH COMPUTER NAVIGATION;  Surgeon: Rod Can, MD;  Location: [facility];  Service: Orthopedics;  Laterality: Right;  Needs RNFA   LEFT HEART CATH AND CORONARY ANGIOGRAPHY N/A 11/21/2018   Procedure: LEFT HEART CATH AND CORONARY  ANGIOGRAPHY;  Surgeon: Burnell Blanks, MD;  Location: [facility];  Service: Cardiovascular;  Laterality: N/A;   HPI:  Vincent [name] is a 70 y.o. male with medical history significant of IIDM, HTN, CAD, chronic systolic CHF (LVEF 45 to 76% 2020 ), severe OA on right knee status post TKR, chronic pain syndrome on narcotics, presented with frequent falls, slurred speech, periotic confusion, bilateral leg weakness and visual hallucination. Dysarthria is severe and ongoing 1-2 months; much worse past 2 weeks. MRI brain reveals "Small subacute infarct in the right parietal white matter, likely a current manifestation of the patient's extensive chronic small  vessel ischemia." MD reports feeling deficits are largely related to severe B12 deficiency.   Assessment / Plan / Recommendation Clinical Impression  [name] demonstrates a severe dysarthria in setting of subacute infarct in right parietal white matter as well as severe b12 deficiency. He remains very confused/agitated, but was more cooperative once his wife left the room. He was noted to be cussing at her and she repeatedly said, "don't hit me, don't hit me" although he is restrained. Pt frequently asking for restraints to be removed. He is edentulous and wife reports he never wears dentures, but has no difficulty with mastication. Oral motor exam revealed adequate strength/ROM for speech, however, this is not consistent with severity of dysarthria. Suspect coordination is reduced in setting of confusion. Pt educated on speech intelligibility strategies,  but is not expected to recall them given mentation at this time. Pt raises his voice when asked to repeat himself out of agitation, however, it does make speech much more intelligible. He is alert and oriented to self, place, time, and somewhat to situation. He stated he had been falling, but then stated he did not need to be here and he is fine. Wife reports this is very different from  baseline.  Recommend follow up SLP for further evaluation of cognition and treatment of dysarthria as patient is appropriate.    SLP Assessment  SLP Recommendation/Assessment: Patient needs continued Speech Lanaguage Pathology Services SLP Visit Diagnosis: Dysarthria and anarthria (R47.1);Cognitive communication deficit (R41.841)    Follow Up Recommendations  Skilled Nursing facility;24 hour supervision/assistance;Other (comment) (pending improvement)    Frequency and Duration min 2x/week  2 weeks      SLP Evaluation Cognition  Overall Cognitive Status: Impaired/Different from baseline Arousal/Alertness: Awake/alert Orientation Level: Oriented X4;Other (comment) (loosely oriented to situation (repeatedly states he's fine)) Attention: Focused;Sustained Focused Attention: Impaired Focused Attention Impairment: Verbal basic;Functional basic Sustained Attention: Impaired Sustained Attention Impairment: Verbal basic;Functional basic Memory: Impaired Memory Impairment: Retrieval deficit;Storage deficit;Decreased short term memory Decreased Short Term Memory: Verbal basic;Functional basic Awareness: Impaired Awareness Impairment: Intellectual impairment;Emergent impairment;Anticipatory impairment Problem Solving: Impaired Problem Solving Impairment: Verbal basic;Functional basic Behaviors: Impulsive;Restless;Verbal agitation;Physical agitation Safety/Judgment: Impaired       Comprehension  Auditory Comprehension Overall Auditory Comprehension: Appears within functional limits for tasks assessed Conversation: Simple Other Conversation Comments: tangential, but responds to questions appropriately    Expression Expression Primary Mode of Expression: Verbal Verbal Expression Overall Verbal Expression: Impaired   Oral / Motor  Oral Motor/Sensory Function Overall Oral Motor/Sensory Function: Severe impairment Facial ROM: Within Functional Limits Facial Symmetry: Within Functional  Limits Facial Strength: Within Functional Limits Facial Sensation: Within Functional Limits Lingual ROM: Within Functional Limits Lingual Symmetry: Within Functional Limits Lingual Strength: Within Functional Limits Lingual Sensation: Within Functional Limits Velum: Within Functional Limits Mandible: Within Functional Limits Motor Speech Overall Motor Speech: Impaired Respiration: Impaired (clavicular breathing) Level of Impairment: Sentence Phonation: Normal Resonance: Within functional limits Articulation: Impaired Level of Impairment: Word Intelligibility: Intelligibility reduced Word: 50-74% accurate Phrase: 50-74% accurate Sentence: 25-49% accurate Conversation: 0-24% accurate Motor Planning: Witnin functional limits Interfering Components: Inadequate dentition Effective Techniques: Slow rate;Pause;Over-articulate;Increased vocal intensity                     Raesha Coonrod P. Laray Rivkin, M.S., [facility] Pathologist Acute Rehabilitation Services Pager: [place] 01/15/2021, 1:51 PM

## 2021-01-16 DIAGNOSIS — I10 Essential (primary) hypertension: Secondary | ICD-10-CM | POA: Diagnosis not present

## 2021-01-16 LAB — CBC WITH DIFFERENTIAL/PLATELET
Abs Immature Granulocytes: 0.04 10*3/uL (ref 0.00–0.07)
Basophils Absolute: 0.1 10*3/uL (ref 0.0–0.1)
Basophils Relative: 0 %
Eosinophils Absolute: 0.1 10*3/uL (ref 0.0–0.5)
Eosinophils Relative: 1 %
HCT: 45.5 % (ref 39.0–52.0)
Hemoglobin: 15.8 g/dL (ref 13.0–17.0)
Immature Granulocytes: 0 %
Lymphocytes Relative: 18 %
Lymphs Abs: 2 10*3/uL (ref 0.7–4.0)
MCH: 31.3 pg (ref 26.0–34.0)
MCHC: 34.7 g/dL (ref 30.0–36.0)
MCV: 90.1 fL (ref 80.0–100.0)
Monocytes Absolute: 1.2 10*3/uL — ABNORMAL HIGH (ref 0.1–1.0)
Monocytes Relative: 11 %
Neutro Abs: 7.8 10*3/uL — ABNORMAL HIGH (ref 1.7–7.7)
Neutrophils Relative %: 70 %
Platelets: 229 10*3/uL (ref 150–400)
RBC: 5.05 MIL/uL (ref 4.22–5.81)
RDW: 13.2 % (ref 11.5–15.5)
WBC: 11.2 10*3/uL — ABNORMAL HIGH (ref 4.0–10.5)
nRBC: 0 % (ref 0.0–0.2)

## 2021-01-16 LAB — COMPREHENSIVE METABOLIC PANEL
ALT: 14 U/L (ref 0–44)
AST: 19 U/L (ref 15–41)
Albumin: 3.8 g/dL (ref 3.5–5.0)
Alkaline Phosphatase: 102 U/L (ref 38–126)
Anion gap: 13 (ref 5–15)
BUN: 21 mg/dL (ref 8–23)
CO2: 17 mmol/L — ABNORMAL LOW (ref 22–32)
Calcium: 9.4 mg/dL (ref 8.9–10.3)
Chloride: 104 mmol/L (ref 98–111)
Creatinine, Ser: 1.11 mg/dL (ref 0.61–1.24)
GFR, Estimated: >60 mL/min (ref >60)
Glucose, Bld: 153 mg/dL — ABNORMAL HIGH (ref 70–99)
Potassium: 4.3 mmol/L (ref 3.5–5.1)
Sodium: 134 mmol/L — ABNORMAL LOW (ref 135–145)
Total Bilirubin: 2.2 mg/dL — ABNORMAL HIGH (ref 0.3–1.2)
Total Protein: 6.7 g/dL (ref 6.5–8.1)

## 2021-01-16 LAB — GLUCOSE, CAPILLARY
Glucose-Capillary: 148 mg/dL — ABNORMAL HIGH (ref 70–99)
Glucose-Capillary: 166 mg/dL — ABNORMAL HIGH (ref 70–99)
Glucose-Capillary: 194 mg/dL — ABNORMAL HIGH (ref 70–99)
Glucose-Capillary: 189 mg/dL — ABNORMAL HIGH (ref 70–99)

## 2021-01-16 LAB — BRAIN NATRIURETIC PEPTIDE: B Natriuretic Peptide: 33 pg/mL (ref 0.0–100.0)

## 2021-01-16 LAB — MAGNESIUM: Magnesium: 2.2 mg/dL (ref 1.7–2.4)

## 2021-01-16 MED ORDER — LORAZEPAM 2 MG/ML IJ SOLN: 2.0000 mg | Freq: Once | INTRAMUSCULAR | Status: DC

## 2021-01-16 MED ORDER — METOPROLOL TARTRATE 5 MG/5ML IV SOLN
5.0000 mg | Freq: Once | INTRAVENOUS | Status: AC
  Administered 2021-01-16: 5 mg via INTRAVENOUS
  Filled 2021-01-16: qty 5

## 2021-01-16 MED ORDER — KCL IN DEXTROSE-NACL 20-5-0.45 MEQ/L-%-% IV SOLN
INTRAVENOUS | Status: AC
  Filled 2021-01-16 (×2): qty 1000

## 2021-01-16 NOTE — Progress Notes (Addendum)
Pt's wife [name] called back at this time w/ updates about this pt.

## 2021-01-16 NOTE — Progress Notes (Signed)
PROGRESS NOTE        PATIENT DETAILS Name: Vincent [name] Age: 70 y.o. Sex: male Date of Birth: September 26, 1951 Admit Date: 01/12/2021 Admitting Physician Lequita Halt, MD BDZ:HGDJ, Emily Filbert., MD  Brief Narrative: Patient is a 70 y.o. male with history of insulin-dependent DM-2, HTN, CAD, chronic systolic heart failure, chronic pain syndrome on narcotics/benzos-presented with a 2-week history of worsening confusion, visual hallucinations and frequent falls.  Spouse reports slurred speech for a few days as well.  Patient was subsequently admitted to the hospitalist service for further evaluation and treatment.  Significant events: 4/7>> admit for evaluation of slurred speech, confusion, hallucination, frequent falls  Significant studies: 4/7>> CT head: Negative for acute abnormalities 4/7>> CT C-spine: No fracture 4/7>> x-ray right knee: Right knee replacement without hardware loosening or fracture 4/8>> Echo: EF 45-50%, apical akinesis, grade 1 diastolic dysfunction.  Swirling of contrast at apex consistent with low flow but no thrombus, +ve WMA. 4/8>> vitamin B12: 72  Antimicrobial therapy: None  Microbiology data: None  Procedures : None  Consults: Neurology  DVT Prophylaxis : enoxaparin (LOVENOX) injection 40 mg Start: 01/13/21 1000   Subjective:   Patient in bed although slightly confused but in no distress, denies any headache chest or abdominal pain.  Assessment/Plan:  Encephalopathy/slurred speech/visual hallucinations: Probable polypharmacy-but need to rule out CVA due to slurred speech-echo findings (smoke seen)-vitamin B12 deficiency may be contributing as well-continue to hold benzos/narcotics-increased Seroquel to 50 mg twice daily-use Haldol as needed.  Clinically improving, MRI brain noted and DW Neuro 01/15/21, neurological changes likely due to prolonged B12 deficiency, according to the wife symptoms have been gradually progressive  over several months. Neurology following.   Incidental small right parietal stroke noted.  He is on aspirin and statin, no deficits contributed to this, continue to monitor.  May require placement.  LDL under 70, A1c mildly elevated.  Vitamin B12 deficiency: On B12 supplementation-recheck vitamin B12 levels in 3 months.  Combined chronic systolic and diastolic heart failure: Euvolemic-since slightly dehydrated on 01/15/2021 hold further diuretics and hydrate.  CAD-s/p PCI to LAD in 2007: No anginal symptoms-has EF 45% with apical akinesis on echo-most recent nuclear stress test in 2017 showed a fixed defect in the apex stable for outpatient follow-up with primary cardiologist.  Remains on aspirin. Will need outpt Cards follow up in 7-10 days post DC.  HTN: Blood pressure controlled-continue present regimen.  Holding ACE inhibitor due to AKI.  HLD: Continue statin-LDL 60.  Mild clinical dehydration with hyponatremia and AKI.  Hydrate with IV fluids and monitor.    DM-2 (A1c 7.2 on 4/7): CBG stable with SSI-hold all oral hypoglycemic agents  Recent Labs    01/15/21 1547 01/15/21 2350 01/16/21 0632  GLUCAP 126* 151* 148*    Obesity: Estimated body mass index is 34.21 kg/m as calculated from the following:   Height as of 07/26/20: 5\' 8"  (1.727 m).   Weight as of 07/26/20: 102.1 kg.   Diet: Diet Order            Diet heart healthy/carb modified Room service appropriate? Yes; Fluid consistency: Thin  Diet effective now                  Code Status: Full code   Family Communication:  [phone] updated on 01/14/21, message left at 10 AM on 01/15/2021,  01/16/21 updated  Disposition Plan: Status is: Inpatient  Remains inpatient appropriate because:Inpatient level of care appropriate due to severity of illness   Dispo: The patient is from: Home              Anticipated d/c is to: Home              Patient currently is not medically stable to d/c.   Difficult  to place patient No   Barriers to Discharge: Ongoing altered mental status-awaiting further work-up-see above  Antimicrobial agents: Anti-infectives (From admission, onward)   None       Time spent:  35 minutes-Greater than 50% of this time was spent in counseling, explanation of diagnosis, planning of further management, and coordination of care.  MEDICATIONS:  Scheduled Meds: .  stroke: mapping our early stages of recovery book   Does not apply Once  . aspirin EC  81 mg Oral Daily  . cyanocobalamin  1,000 mcg Subcutaneous Daily  . enoxaparin (LOVENOX) injection  40 mg Subcutaneous Q24H  . insulin aspart  0-15 Units Subcutaneous TID WC  . isosorbide mononitrate  30 mg Oral Daily  . metoprolol tartrate  50 mg Oral BID  . QUEtiapine  50 mg Oral BID  . simvastatin  40 mg Oral QHS   Continuous Infusions: PRN Meds:.acetaminophen **OR** [DISCONTINUED] acetaminophen (TYLENOL) oral liquid 160 mg/5 mL **OR** [DISCONTINUED] acetaminophen, haloperidol lactate, senna-docusate   PHYSICAL EXAM: Vital signs: Vitals:   01/15/21 2342 01/16/21 0235 01/16/21 0429 01/16/21 0735  BP:  132/74 138/88 (!) 152/95  Pulse: (!) 103 (!) 102 98 100  Resp:  (!) 28 (!) 24 (!) 22  Temp:  97.8 F (36.6 C) 97.8 F (36.6 C) 98.7 F (37.1 C)  TempSrc:  Oral Oral Axillary  SpO2:  100% 100% 100%   There were no vitals filed for this visit. There is no height or weight on file to calculate BMI.   Gen Exam:   Awake but still confused, speech still dysarthric, moving all 4 extremities,  [place].AT,PERRAL Supple Neck,No JVD, No cervical lymphadenopathy appriciated.  Symmetrical Chest wall movement, Good air movement bilaterally, CTAB RRR,No Gallops, Rubs or new Murmurs, No Parasternal Heave +ve B.Sounds, Abd Soft, No tenderness, No organomegaly appriciated, No rebound - guarding or rigidity. No Cyanosis, Clubbing or edema, No new Rash or bruise    I have personally reviewed following labs and imaging  studies  LABORATORY DATA: CBC: Recent Labs  Lab 01/12/21 1548 01/14/21 0327 01/16/21 0254  WBC 9.6 9.6 11.2*  NEUTROABS  --   --  7.8*  HGB 14.2 16.5 15.8  HCT 42.2 48.7 45.5  MCV 91.7 90.4 90.1  PLT 246 238 235    Basic Metabolic Panel: Recent Labs  Lab 01/12/21 1548 01/14/21 0327 01/15/21 0221 01/16/21 0254  NA 136 138 132* 134*  K 4.2 4.1 4.1 4.3  CL 101 104 99 104  CO2 28 20* 19* 17*  GLUCOSE 125* 130* 123* 153*  BUN 9 15 22 21   CREATININE 0.96 1.12 1.28* 1.11  CALCIUM 9.4 9.8 9.2 9.4  MG  --   --   --  2.2    GFR: CrCl cannot be calculated (Unknown ideal weight.).  Liver Function Tests: Recent Labs  Lab 01/12/21 1548 01/14/21 0327 01/16/21 0254  AST 16 21 19   ALT 13 15 14   ALKPHOS 96 103 102  BILITOT 0.7 2.1* 2.2*  PROT 6.1* 7.3 6.7  ALBUMIN 3.8 4.3 3.8   No results  for input(s): LIPASE, AMYLASE in the last 168 hours. Recent Labs  Lab 01/12/21 2221  AMMONIA 11   Lab Results  Component Value Date   HGBA1C 7.2 (H) 01/12/2021   Lab Results  Component Value Date   CHOL 118 01/12/2021   HDL 46 01/12/2021   LDLCALC 60 01/12/2021   TRIG 61 01/12/2021   CHOLHDL 2.6 01/12/2021    Coagulation Profile: No results for input(s): INR, PROTIME in the last 168 hours.  Cardiac Enzymes: No results for input(s): CKTOTAL, CKMB, CKMBINDEX, TROPONINI in the last 168 hours.  BNP (last 3 results) No results for input(s): PROBNP in the last 8760 hours.  Lipid Profile: No results for input(s): CHOL, HDL, LDLCALC, TRIG, CHOLHDL, LDLDIRECT in the last 72 hours.  Thyroid Function Tests: No results for input(s): TSH, T4TOTAL, FREET4, T3FREE, THYROIDAB in the last 72 hours.  Anemia Panel: No results for input(s): VITAMINB12, FOLATE, FERRITIN, TIBC, IRON, RETICCTPCT in the last 72 hours.  Urine analysis:    Component Value Date/Time   COLORURINE YELLOW 01/15/2021 1442   APPEARANCEUR CLEAR 01/15/2021 1442   LABSPEC 1.027 01/15/2021 1442   PHURINE 5.0  01/15/2021 1442   GLUCOSEU >=500 (A) 01/15/2021 1442   HGBUR NEGATIVE 01/15/2021 1442   BILIRUBINUR NEGATIVE 01/15/2021 1442   KETONESUR 80 (A) 01/15/2021 1442   PROTEINUR NEGATIVE 01/15/2021 1442   UROBILINOGEN 0.2 01/06/2014 1257   NITRITE NEGATIVE 01/15/2021 1442   LEUKOCYTESUR NEGATIVE 01/15/2021 1442    Sepsis Labs: Lactic Acid, Venous No results found for: LATICACIDVEN  MICROBIOLOGY: Recent Results (from the past 240 hour(s))  Resp Panel by RT-PCR (Flu A&B, Covid) Nasopharyngeal Swab     Status: None   Collection Time: 01/12/21  4:19 PM   Specimen: Nasopharyngeal Swab; Nasopharyngeal(NP) swabs in vial transport medium  Result Value Ref Range Status   SARS Coronavirus 2 by RT PCR NEGATIVE NEGATIVE Final    Comment: (NOTE) SARS-CoV-2 target nucleic acids are NOT DETECTED.  The SARS-CoV-2 RNA is generally detectable in upper respiratory specimens during the acute phase of infection. The lowest concentration of SARS-CoV-2 viral copies this assay can detect is 138 copies/mL. A negative result does not preclude SARS-Cov-2 infection and should not be used as the sole basis for treatment or other patient management decisions. A negative result may occur with  improper specimen collection/handling, submission of specimen other than nasopharyngeal swab, presence of viral mutation(s) within the areas targeted by this assay, and inadequate number of viral copies(<138 copies/mL). A negative result must be combined with clinical observations, patient history, and epidemiological information. The expected result is Negative.  Fact Sheet for Patients:  EntrepreneurPulse.com.au  Fact Sheet for Healthcare Providers:  IncredibleEmployment.be  This test is no t yet approved or cleared by the [place] FDA and  has been authorized for detection and/or diagnosis of SARS-CoV-2 by FDA under an Emergency Use Authorization (EUA). This EUA will remain   in effect (meaning this test can be used) for the duration of the COVID-19 declaration under Section 564(b)(1) of the Act, 21 U.S.C.section 360bbb-3(b)(1), unless the authorization is terminated  or revoked sooner.       Influenza A by PCR NEGATIVE NEGATIVE Final   Influenza B by PCR NEGATIVE NEGATIVE Final    Comment: (NOTE) The Xpert Xpress SARS-CoV-2/FLU/RSV plus assay is intended as an aid in the diagnosis of influenza from Nasopharyngeal swab specimens and should not be used as a sole basis for treatment. Nasal washings and aspirates are unacceptable for Xpert Xpress  SARS-CoV-2/FLU/RSV testing.  Fact Sheet for Patients: EntrepreneurPulse.com.au  Fact Sheet for Healthcare Providers: IncredibleEmployment.be  This test is not yet approved or cleared by the [place] FDA and has been authorized for detection and/or diagnosis of SARS-CoV-2 by FDA under an Emergency Use Authorization (EUA). This EUA will remain in effect (meaning this test can be used) for the duration of the COVID-19 declaration under Section 564(b)(1) of the Act, 21 U.S.C. section 360bbb-3(b)(1), unless the authorization is terminated or revoked.  Performed at [facility], [place] [address], [place], [place] [address]     RADIOLOGY STUDIES/RESULTS: MR BRAIN WO CONTRAST  Result Date: 01/15/2021 CLINICAL DATA:  Headache, new or worsening.  Confusion. EXAM: MRI HEAD WITHOUT CONTRAST TECHNIQUE: Multiplanar, multiecho pulse sequences of the brain and surrounding structures were obtained without intravenous contrast. COMPARISON:  Head CT from 3 days ago. FINDINGS: Brain: Subcentimeter focus of weakly restricted diffusion in the parasagittal and subcortical right parietal lobe. Background of chronic small vessel ischemia with generalized ischemic gliosis in the cerebral white matter and pons. Small remote left cerebellar infarct. No masslike finding, hydrocephalus, atrophy,  or collection. Vascular: Normal flow voids. Skull and upper cervical spine: Separate cervical MRI. No focal marrow lesion. Sinuses/Orbits: Negative Other: Truncated study due to patient confusion. IMPRESSION: 1. Small subacute infarct in the right parietal white matter, likely a current manifestation of the patient's extensive chronic small vessel ischemia. 2. No reversible finding. Electronically Signed   By: Monte Fantasia M.D.   On: 01/15/2021 12:01   MR CERVICAL SPINE WO CONTRAST  Result Date: 01/15/2021 CLINICAL DATA:  Confusion.  Frequent falls. EXAM: MRI CERVICAL SPINE WITHOUT CONTRAST TECHNIQUE: Multiplanar, multisequence MR imaging of the cervical spine was performed. No intravenous contrast was administered. COMPARISON:  CT cervical spine dated January 12, 2021. FINDINGS: Despite efforts by the technologist and patient, motion artifact is present on today's exam and could not be eliminated. This reduces exam sensitivity and specificity. The examination was also ended early and the axial medic images were not obtained. Alignment: Unchanged reversal of the normal cervical lordosis and trace anterolisthesis at C5-C6. Vertebrae: No fracture, evidence of discitis, or bone lesion. Cord: Normal signal and morphology. Posterior Fossa, vertebral arteries, paraspinal tissues: Chronic microvascular ischemic changes in the pons. Otherwise negative. Disc levels: C2-C3:  Interbody and facet ankylosis.  No stenosis. C3-C4: No significant disc bulge or herniation. Mild-to-moderate bilateral facet arthropathy. Moderate right uncovertebral hypertrophy. Moderate right neuroforaminal stenosis. No spinal canal or left neuroforaminal stenosis. C4-C5: No significant disc bulge or herniation. Mild bilateral uncovertebral hypertrophy. Moderate left and mild right facet arthropathy. No stenosis. C5-C6: Severe disc height loss without significant disc bulge or herniation. Moderate bilateral facet uncovertebral hypertrophy. Mild  bilateral neuroforaminal stenosis. No spinal canal stenosis. C6-C7: Mild disc bulging. Moderate bilateral uncovertebral hypertrophy. No stenosis. C7-T1: Negative disc. Moderate right and mild left facet arthropathy. No stenosis. The visualized upper thoracic spine is unremarkable. IMPRESSION: 1. Normal cervical spinal cord. 2. Multilevel degenerative changes of the cervical spine as described above. No spinal canal stenosis at any level. 3. Moderate right neuroforaminal stenosis at C3-C4. Electronically Signed   By: Titus Dubin M.D.   On: 01/15/2021 12:26     LOS: 4 days   Lala Lund, MD  [facility]  01/16/2021, 11:07 AM

## 2021-01-16 NOTE — Progress Notes (Signed)
Pt refused all HS meds. Dr. Tonie Griffith informed.

## 2021-01-16 NOTE — Progress Notes (Signed)
Patient became increasingly agitated toward staff, cussing an yelling, dispostioning IV, which had to be replaced. MD notified and ordered medication to assist with agitation. Ativan 2mg  was given at HS and was effective, patient rested intermittently throuihout the night. Patient alert ans oriented times 2 continues to be dysarthric and insist on getting OOB at this time.

## 2021-01-16 NOTE — TOC CAGE-AID Note (Signed)
Transition of Care [facility]) - CAGE-AID Screening   Patient Details  Name: Vincent [name] MRN: [id number] Date of Birth: 04-Sep-1951  Transition of Care [facility]) CM/SW Contact:    Geralynn Ochs, LCSW Phone Number: 01/16/2021, 10:46 AM   Clinical Narrative:   CSW acknowledging consult for substance abuse assessment, but patient is only oriented to self and not appropriate to participate at this time.     CAGE-AID Screening: Substance Abuse Screening unable to be completed due to: : Patient unable to participate             Substance Abuse Education Offered: No

## 2021-01-16 NOTE — Plan of Care (Signed)
  Problem: Nutrition: Goal: Risk of aspiration will decrease Outcome: Progressing Goal: Dietary intake will improve Outcome: Progressing   Problem: Ischemic Stroke/TIA Tissue Perfusion: Goal: Complications of ischemic stroke/TIA will be minimized Outcome: Progressing   Problem: Nutrition: Goal: Adequate nutrition will be maintained Outcome: Progressing   Problem: Elimination: Goal: Will not experience complications related to bowel motility Outcome: Progressing Goal: Will not experience complications related to urinary retention Outcome: Progressing

## 2021-01-17 ENCOUNTER — Inpatient Hospital Stay (HOSPITAL_COMMUNITY): Payer: HMO

## 2021-01-17 DIAGNOSIS — I10 Essential (primary) hypertension: Secondary | ICD-10-CM | POA: Diagnosis not present

## 2021-01-17 LAB — CBC WITH DIFFERENTIAL/PLATELET
Abs Immature Granulocytes: 0.06 10*3/uL (ref 0.00–0.07)
Basophils Absolute: 0 10*3/uL (ref 0.0–0.1)
Basophils Relative: 0 %
Eosinophils Absolute: 0 10*3/uL (ref 0.0–0.5)
Eosinophils Relative: 0 %
HCT: 46.5 % (ref 39.0–52.0)
Hemoglobin: 16.2 g/dL (ref 13.0–17.0)
Immature Granulocytes: 1 %
Lymphocytes Relative: 14 %
Lymphs Abs: 1.9 10*3/uL (ref 0.7–4.0)
MCH: 31.6 pg (ref 26.0–34.0)
MCHC: 34.8 g/dL (ref 30.0–36.0)
MCV: 90.6 fL (ref 80.0–100.0)
Monocytes Absolute: 1.4 10*3/uL — ABNORMAL HIGH (ref 0.1–1.0)
Monocytes Relative: 11 %
Neutro Abs: 9.5 10*3/uL — ABNORMAL HIGH (ref 1.7–7.7)
Neutrophils Relative %: 74 %
Platelets: 245 10*3/uL (ref 150–400)
RBC: 5.13 MIL/uL (ref 4.22–5.81)
RDW: 13.5 % (ref 11.5–15.5)
WBC: 12.9 10*3/uL — ABNORMAL HIGH (ref 4.0–10.5)
nRBC: 0 % (ref 0.0–0.2)

## 2021-01-17 LAB — URINALYSIS, ROUTINE W REFLEX MICROSCOPIC
Bilirubin Urine: NEGATIVE
Glucose, UA: >=500 mg/dL — AB
Hgb urine dipstick: NEGATIVE
Ketones, ur: 80 mg/dL — AB
Leukocytes,Ua: NEGATIVE
Nitrite: NEGATIVE
Protein, ur: NEGATIVE mg/dL
Specific Gravity, Urine: 1.033 — ABNORMAL HIGH (ref 1.005–1.030)
pH: 5 (ref 5.0–8.0)

## 2021-01-17 LAB — COMPREHENSIVE METABOLIC PANEL
ALT: 12 U/L (ref 0–44)
AST: 16 U/L (ref 15–41)
Albumin: 3.7 g/dL (ref 3.5–5.0)
Alkaline Phosphatase: 112 U/L (ref 38–126)
Anion gap: 15 (ref 5–15)
BUN: 16 mg/dL (ref 8–23)
CO2: 17 mmol/L — ABNORMAL LOW (ref 22–32)
Calcium: 9.7 mg/dL (ref 8.9–10.3)
Chloride: 106 mmol/L (ref 98–111)
Creatinine, Ser: 0.98 mg/dL (ref 0.61–1.24)
GFR, Estimated: >60 mL/min (ref >60)
Glucose, Bld: 184 mg/dL — ABNORMAL HIGH (ref 70–99)
Potassium: 4.4 mmol/L (ref 3.5–5.1)
Sodium: 138 mmol/L (ref 135–145)
Total Bilirubin: 2.6 mg/dL — ABNORMAL HIGH (ref 0.3–1.2)
Total Protein: 6.9 g/dL (ref 6.5–8.1)

## 2021-01-17 LAB — BRAIN NATRIURETIC PEPTIDE: B Natriuretic Peptide: 51 pg/mL (ref 0.0–100.0)

## 2021-01-17 LAB — PROCALCITONIN: Procalcitonin: <0.1 ng/mL

## 2021-01-17 LAB — GLUCOSE, CAPILLARY
Glucose-Capillary: 184 mg/dL — ABNORMAL HIGH (ref 70–99)
Glucose-Capillary: 178 mg/dL — ABNORMAL HIGH (ref 70–99)
Glucose-Capillary: 156 mg/dL — ABNORMAL HIGH (ref 70–99)
Glucose-Capillary: 153 mg/dL — ABNORMAL HIGH (ref 70–99)

## 2021-01-17 LAB — MAGNESIUM: Magnesium: 2.4 mg/dL (ref 1.7–2.4)

## 2021-01-17 IMAGING — DX DG CHEST 1V PORT
1 series · 1 of 1 positions shown · non-contrast
Comparison: [DATE]

CLINICAL DATA: Short of breath and nausea

EXAM:
PORTABLE CHEST 1 VIEW

[chest ap]
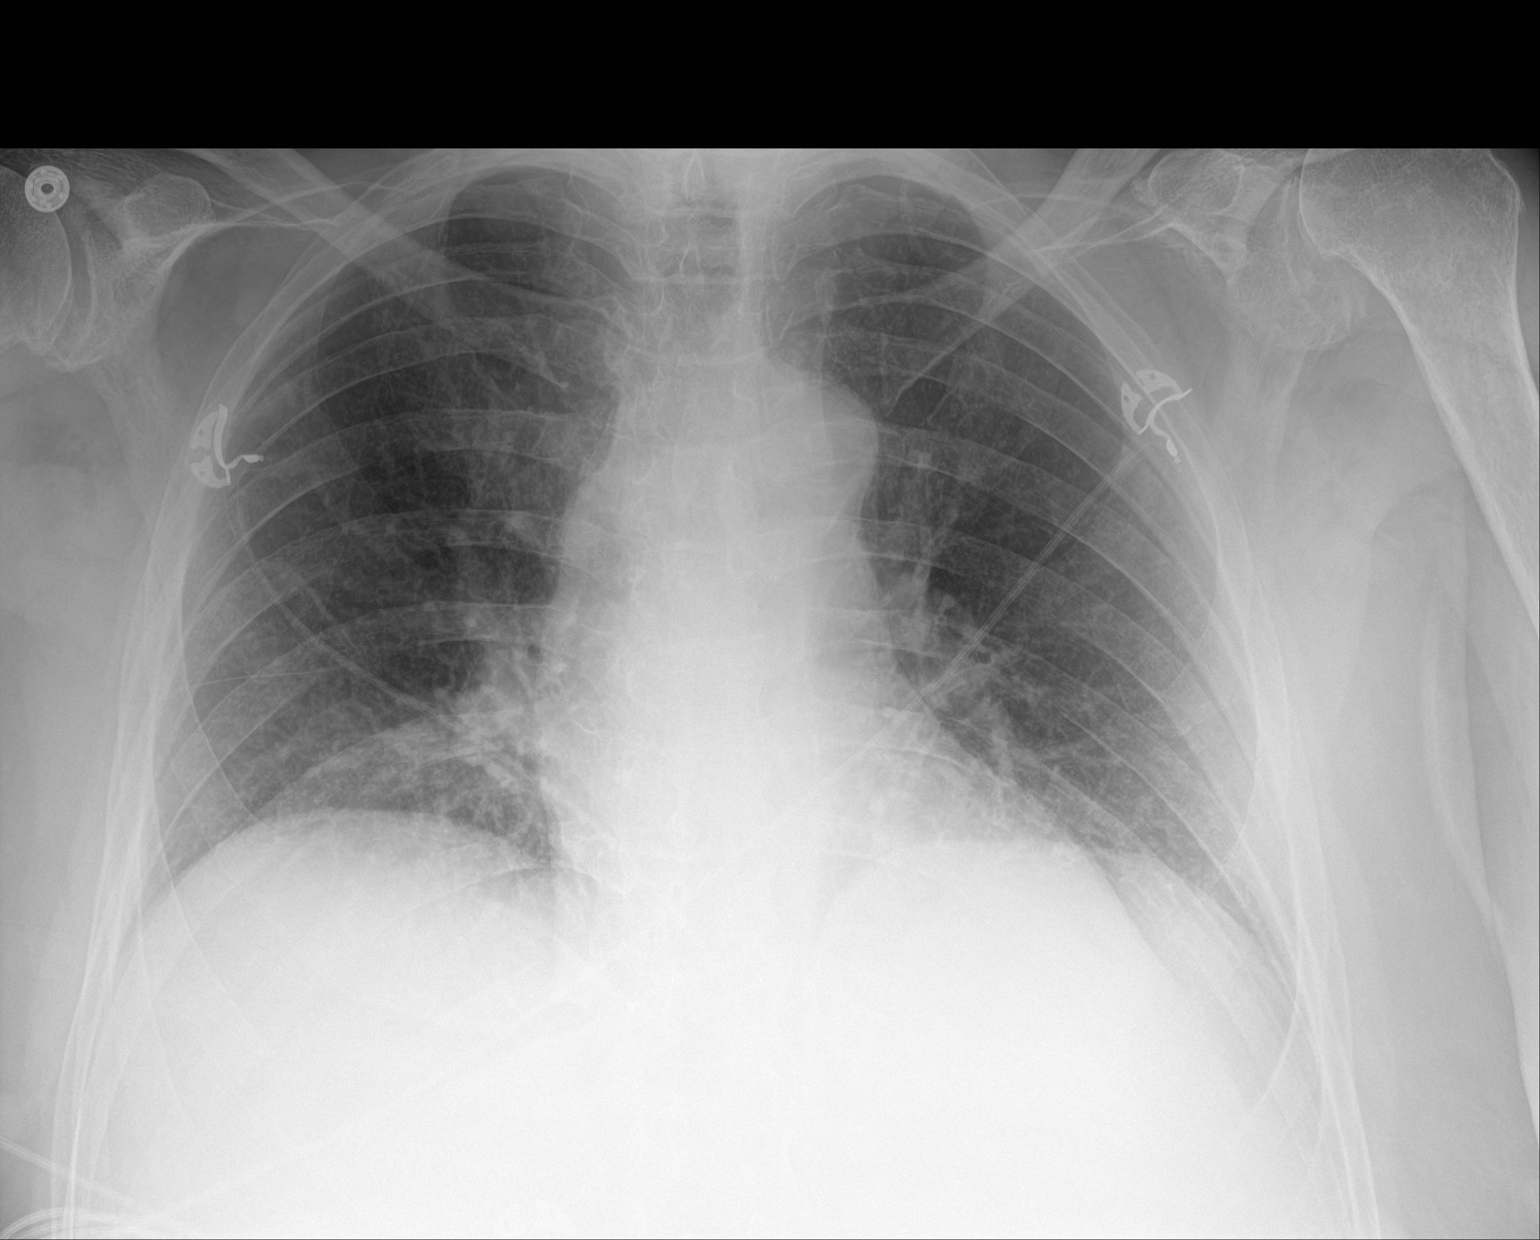

[1 of 1 positions shown; findings below may reference images not displayed]

FINDINGS: Single frontal view of the chest demonstrates an unremarkable
cardiac silhouette. Lung volumes are diminished with crowding of the
central vasculature. There is developing consolidation within the
medial right lung base which could reflect airspace disease or
atelectasis. No effusion or pneumothorax.
IMPRESSION: 1. Low lung volumes, with developing consolidation medial right lung
base favor atelectasis.
2. Continued central vascular congestion.

## 2021-01-17 IMAGING — DX DG ABD PORTABLE 1V
1 series · 1 of 1 positions shown · non-contrast
Comparison: [DATE]

CLINICAL DATA: Short of breath, nausea

EXAM:
PORTABLE ABDOMEN - 1 VIEW

[abdomen kub]
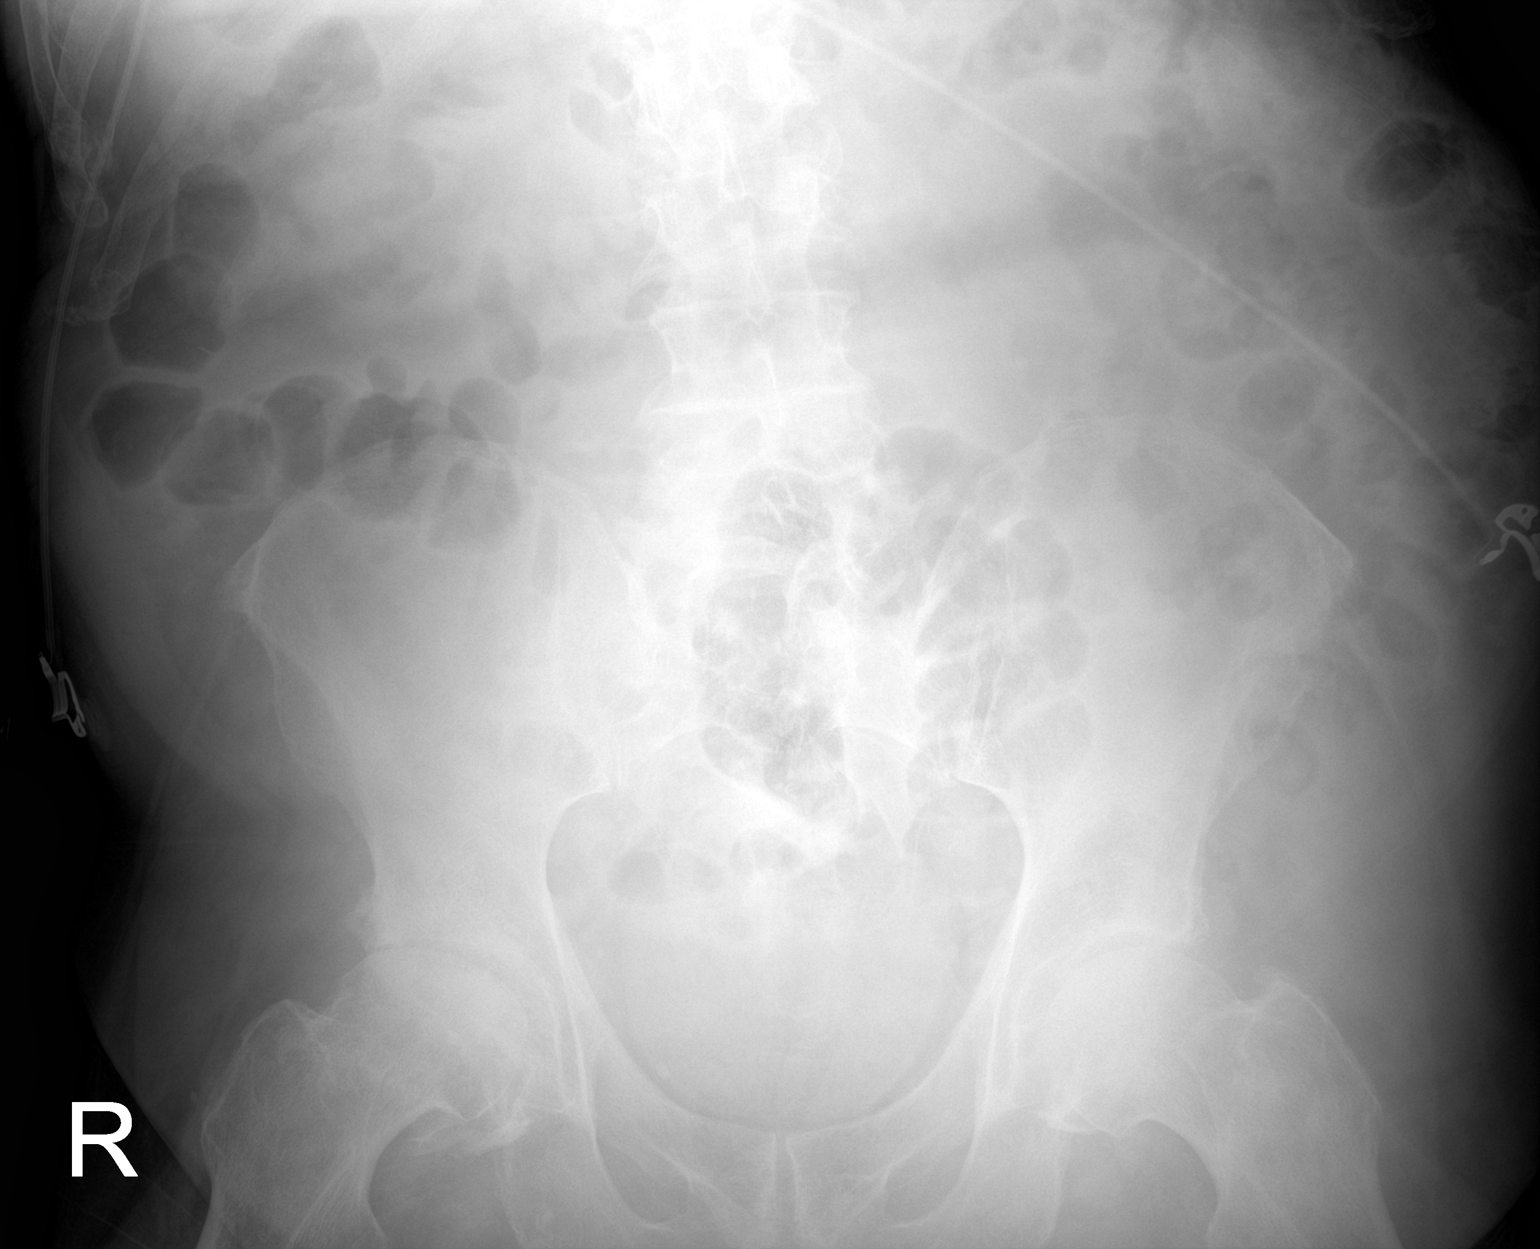

[1 of 1 positions shown; findings below may reference images not displayed]

FINDINGS: Supine frontal view of the abdomen and pelvis was performed,
excluding the left flank and upper abdomen by collimation. Bowel gas
pattern is unremarkable without obstruction or ileus. No masses or
abnormal calcifications. Moderate right hip osteoarthritis with
joint space narrowing, bony remodeling of the femoral head, and
marked marginal osteophyte formation. This has progressed
significantly since prior study. Stable mild left hip
osteoarthritis.
IMPRESSION: 1. Unremarkable bowel gas pattern.
2. Moderate to severe progressive right hip osteoarthritis.

## 2021-01-17 IMAGING — DX DG CHEST 1V PORT
1 series · 1 of 1 positions shown · non-contrast
Comparison: [DATE]

CLINICAL DATA: Shortness of breath.

EXAM:
PORTABLE CHEST 1 VIEW

[chest ap]
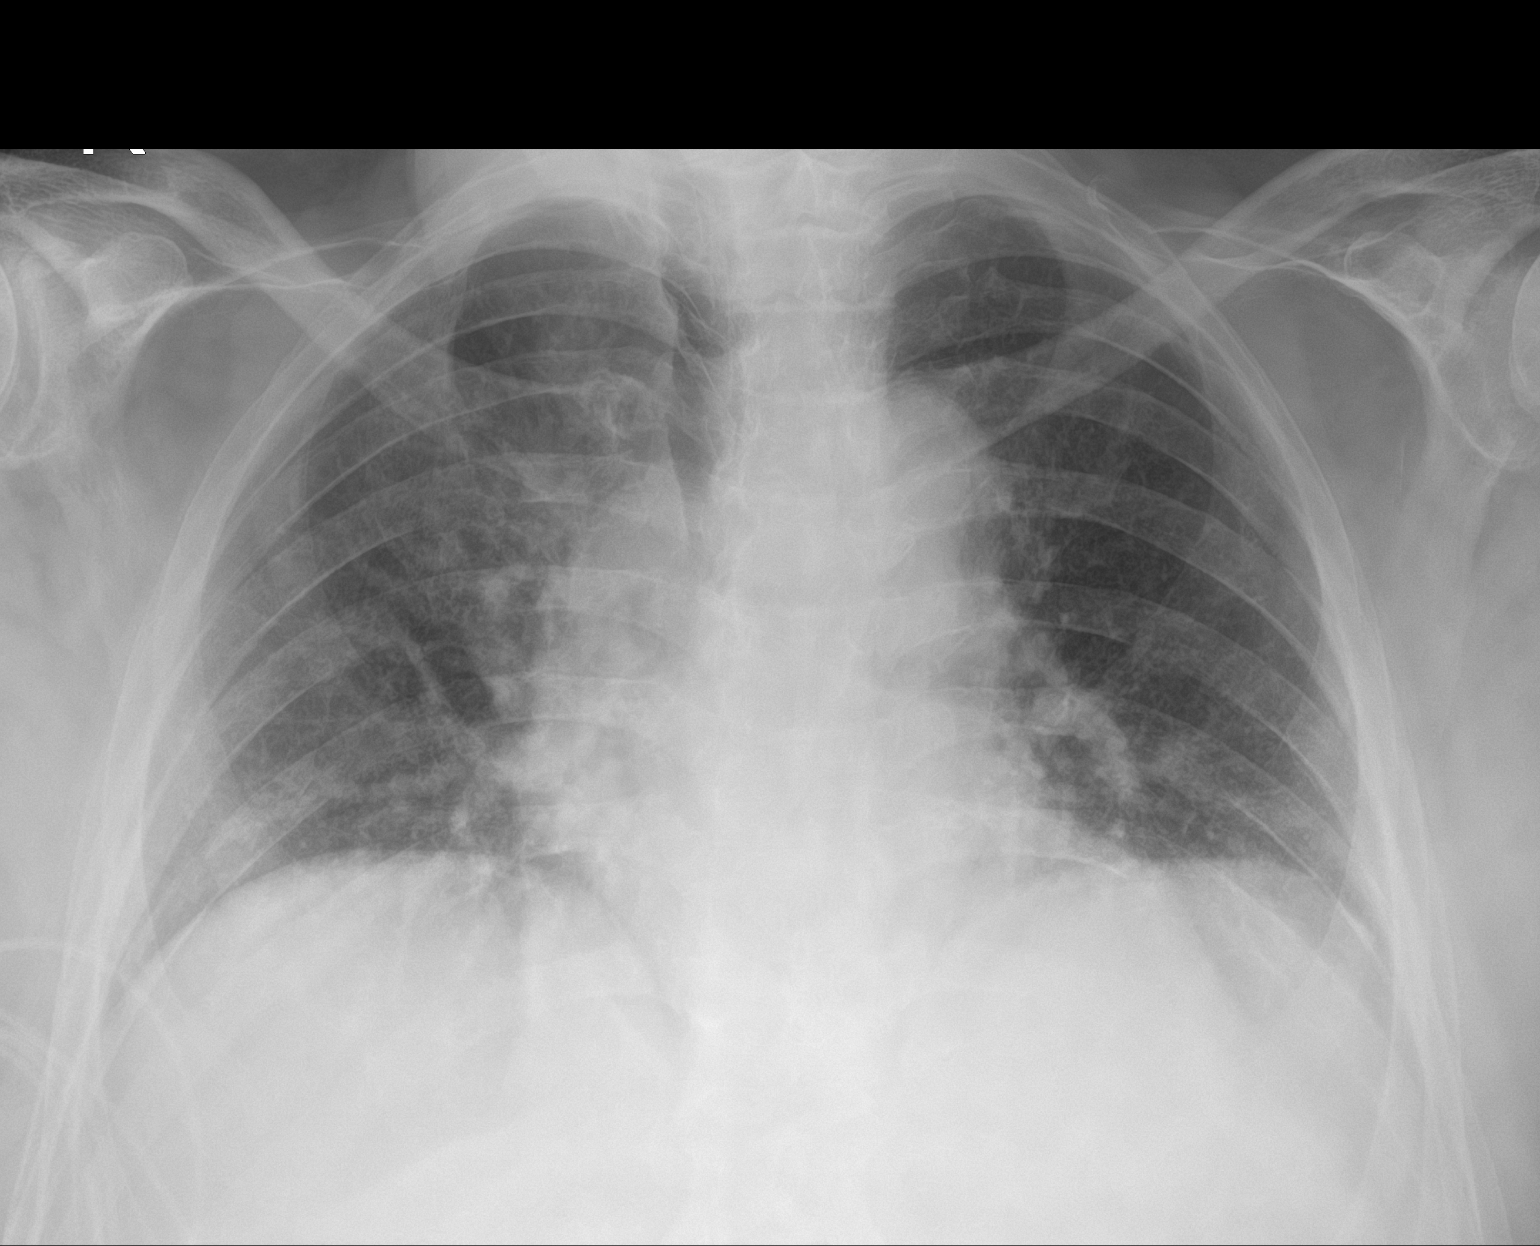

[1 of 1 positions shown; findings below may reference images not displayed]

FINDINGS: Single view of the chest demonstrates low lung volumes. Heart size
is normal but there is fullness of the central vascular structures.
No discrete airspace disease or lung consolidation. Negative for a
pneumothorax.
IMPRESSION: 1. Low lung volumes without focal lung disease.
2. Slight prominence of the central vascular structures including
the azygos shadow. These findings may be accentuated by the low lung
volumes.

## 2021-01-17 MED ORDER — CYANOCOBALAMIN 1000 MCG/ML IJ SOLN
1000.0000 ug | INTRAMUSCULAR | Status: DC
  Administered 2021-01-19: 1000 ug via SUBCUTANEOUS
  Filled 2021-01-17: qty 1

## 2021-01-17 MED ORDER — POLYETHYLENE GLYCOL 3350 17 G PO PACK
17.0000 g | PACK | Freq: Two times a day (BID) | ORAL | Status: DC
  Administered 2021-01-17 – 2021-01-27 (×11): 17 g via ORAL
  Filled 2021-01-17 (×14): qty 1

## 2021-01-17 MED ORDER — METOPROLOL TARTRATE 50 MG PO TABS
100.0000 mg | ORAL_TABLET | Freq: Two times a day (BID) | ORAL | Status: DC
  Administered 2021-01-17 – 2021-01-26 (×15): 100 mg via ORAL
  Filled 2021-01-17 (×18): qty 2

## 2021-01-17 MED ORDER — SODIUM CHLORIDE 0.9 % IV SOLN
1.0000 g | Freq: Three times a day (TID) | INTRAVENOUS | Status: AC
  Administered 2021-01-17 – 2021-01-21 (×13): 1 g via INTRAVENOUS
  Filled 2021-01-17 (×13): qty 1

## 2021-01-17 MED ORDER — METOPROLOL TARTRATE 5 MG/5ML IV SOLN
5.0000 mg | Freq: Three times a day (TID) | INTRAVENOUS | Status: DC | PRN
  Administered 2021-01-18 – 2021-01-23 (×6): 5 mg via INTRAVENOUS
  Filled 2021-01-17 (×6): qty 5

## 2021-01-17 MED ORDER — SODIUM CHLORIDE 0.9 % IV SOLN
100.0000 mg | Freq: Two times a day (BID) | INTRAVENOUS | Status: DC
  Administered 2021-01-17 – 2021-01-19 (×4): 100 mg via INTRAVENOUS
  Filled 2021-01-17 (×5): qty 100

## 2021-01-17 MED ORDER — DOCUSATE SODIUM 100 MG PO CAPS
200.0000 mg | ORAL_CAPSULE | Freq: Two times a day (BID) | ORAL | Status: DC
  Administered 2021-01-17 – 2021-01-27 (×11): 200 mg via ORAL
  Filled 2021-01-17 (×17): qty 2

## 2021-01-17 MED ORDER — BISACODYL 10 MG RE SUPP
10.0000 mg | Freq: Every day | RECTAL | Status: DC
  Administered 2021-01-17 – 2021-01-20 (×2): 10 mg via RECTAL
  Filled 2021-01-17 (×5): qty 1

## 2021-01-17 MED ORDER — KCL IN DEXTROSE-NACL 20-5-0.45 MEQ/L-%-% IV SOLN
INTRAVENOUS | Status: DC
  Filled 2021-01-17 (×2): qty 1000

## 2021-01-17 MED ORDER — AMLODIPINE BESYLATE 10 MG PO TABS
10.0000 mg | ORAL_TABLET | Freq: Every day | ORAL | Status: DC
  Administered 2021-01-17 – 2021-01-27 (×11): 10 mg via ORAL
  Filled 2021-01-17 (×11): qty 1

## 2021-01-17 NOTE — Progress Notes (Signed)
Pharmacy Antibiotic Note  Vincent [name] is a 70 y.o. male admitted on 01/12/2021 with encephalopathy, slurred speech and visual hallucination. Neurological changes likely due to prolonged B12 deficiency. Incidental small R parietal stroke also noted. Pt now with low-grade temps, CXR ?infiltrate, sustained tachycardia; at high risk for aspiration. Pharmacy has been consulted for meropenem dosing for aspiration pneumonia (due to hx of PCN allergy).  WBC up to 12.9, Tmax 99.7, HR 128; Scr 0.98, CrCl 79.3 ml/min  Plan: Meropenem 1 gm IV Q 8 hrs Monitor WBC, temp, clinical improvement, cultures, renal function   Temp (24hrs), Avg:98.9 F (37.2 C), Min:97.6 F (36.4 C), Max:99.7 F (37.6 C)  Recent Labs  Lab 01/12/21 1548 01/14/21 0327 01/15/21 0221 01/16/21 0254 01/17/21 0505  WBC 9.6 9.6  --  11.2* 12.9*  CREATININE 0.96 1.12 1.28* 1.11 0.98    CrCl cannot be calculated (Unknown ideal weight.).    Allergies  Allergen Reactions  . Penicillins Other (See Comments)    Disputed in 2022 Has patient had a PCN reaction causing immediate rash, facial/tongue/throat swelling, SOB or lightheadedness with hypotension: Unknown Has patient had a PCN reaction causing severe rash involving mucus membranes or skin necrosis: Unknown Has patient had a PCN reaction that required hospitalization: Unknown Has patient had a PCN reaction occurring within the last 10 years: No If all of the above answers are "NO", then may proceed with Cephalosporin use.     Antimicrobials this admission: 4/12 Doxycycline IV >> 4/12 Meropenem >>  Microbiology results: 4/12 BCx X 2: pending 4/7 COVID, flu A, flu B, HIV screen: negative  Thank you for allowing pharmacy to be a part of this patient's care.  Gillermina Hu, PharmD, BCPS, [facility] Clinical Pharmacist 01/17/2021 5:45 PM

## 2021-01-17 NOTE — Progress Notes (Signed)
Physical Therapy Treatment Patient Details Name: Vincent [name] MRN: [id number] DOB: Apr 14, 1951 Today's Date: 01/17/2021    History of Present Illness 70 y/o male presented to ED at [facility] on 4/7 after being sent to [facility] for emergent MRI but unable to get to car. 2 weeks prior, patient had syncopal episode at PCP and has had 16 falls since. Patient presents with slurred speech, weakness, and confusion for one month. Unable to obtain MRI secondary to agitation. CT head and c-spine show chronic microvascular disease and severe C6 degenerative changes without acute process. PMH: anxiety, CAD, depression, DM type 2, HLD, HTN    PT Comments    Pt remains to have both severely impaired cognition and mobility. Pt requiring maxAx2 for all mobility today and was unable to achieve full upright standing. Pt continues to have poor attention span, inability to focus on task at hand, is easily distracted, hallucinating, and unable to sequence transfers requiring maxA. Pt to benefit from ST-SNF upon address both cognitive and functional deficits to achieve safe level of mobility to transition home with spouse.    Follow Up Recommendations  SNF;Supervision/Assistance - 24 hour     Equipment Recommendations  None recommended by PT    Recommendations for Other Services       Precautions / Restrictions Precautions Precautions: Fall Precaution Comments: wrist restraints Restrictions Weight Bearing Restrictions: No    Mobility  Bed Mobility Overal bed mobility: Needs Assistance Bed Mobility: Rolling;Sidelying to Sit Rolling: Max assist Sidelying to sit: Max assist;+2 for physical assistance       General bed mobility comments: max verbal and tactile cues, maxA for LE management as pt with no initiation, maxA for trunk elevation, modA to maintain EOB balance    Transfers Overall transfer level: Needs assistance Equipment used: Rolling walker (2 wheeled) Transfers: Sit to/from [employer] Sit to Stand: Max assist;+2 physical assistance Stand pivot transfers: Max assist;+2 physical assistance       General transfer comment: maxAx2 to power up, pt with bent bilat knees, attemptedx2 with RW however ultimately requiring maxAx2 with face to face transfer with gait belt, maxA to advance R LE towards chair, pt unable to sequence stepping pattern or focus on transfer  Ambulation/Gait             General Gait Details: unable   Stairs             Wheelchair Mobility    Modified Rankin (Stroke Patients Only) Modified Rankin (Stroke Patients Only) Pre-Morbid Rankin Score: Moderate disability Modified Rankin: Severe disability     Balance Overall balance assessment: Needs assistance Sitting-balance support: No upper extremity supported;Feet supported Sitting balance-Leahy Scale: Poor     Standing balance support: Bilateral upper extremity supported;During functional activity Standing balance-Leahy Scale: Zero Standing balance comment: reliant on UE support and external assist                            Cognition Arousal/Alertness: Awake/alert Behavior During Therapy: Impulsive Overall Cognitive Status: Impaired/Different from baseline Area of Impairment: Attention;Memory;Following commands;Safety/judgement;Awareness;Problem solving                   Current Attention Level: Focused (easily distracted) Memory: Decreased short-term memory Following Commands: Follows one step commands with increased time;Follows one step commands inconsistently Safety/Judgement: Decreased awareness of safety;Decreased awareness of deficits Awareness: Intellectual Problem Solving: Slow processing;Decreased initiation;Difficulty sequencing;Requires verbal cues;Requires tactile cues General Comments: Pt with  tangental speech at times and easily distracted requiring freq re-direction. Pt hallucinating requiring freq re-direction as well       Exercises      General Comments General comments (skin integrity, edema, etc.): vss      Pertinent Vitals/Pain Pain Assessment: Faces Faces Pain Scale: Hurts whole lot Pain Location: R knee with movement Pain Descriptors / Indicators: Grimacing;Guarding Pain Intervention(s): Monitored during session    Home Living                      Prior Function            PT Goals (current goals can now be found in the care plan section) Progress towards PT goals: Not progressing toward goals - comment    Frequency    Min 3X/week      PT Plan Current plan remains appropriate    Co-evaluation              AM-PAC PT "6 Clicks" Mobility   Outcome Measure  Help needed turning from your back to your side while in a flat bed without using bedrails?: A Lot Help needed moving from lying on your back to sitting on the side of a flat bed without using bedrails?: A Lot Help needed moving to and from a bed to a chair (including a wheelchair)?: A Lot Help needed standing up from a chair using your arms (e.g., wheelchair or bedside chair)?: A Lot Help needed to walk in hospital room?: Total Help needed climbing 3-5 steps with a railing? : Total 6 Click Score: 10    End of Session Equipment Utilized During Treatment: Gait belt Activity Tolerance: Patient tolerated treatment well Patient left: in chair;with call bell/phone within reach;with chair alarm set;with family/visitor present (wrist restraints left off as wife was in room, pt was no longer agitated and confirmed with RN Minette Brine) Nurse Communication: Mobility status PT Visit Diagnosis: Unsteadiness on feet (R26.81);Muscle weakness (generalized) (M62.81);Repeated falls (R29.6);Other abnormalities of gait and mobility (R26.89)     Time: 9937-1696 PT Time Calculation (min) (ACUTE ONLY): 25 min  Charges:  $Therapeutic Activity: 8-22 mins $Neuromuscular Re-education: 8-22 mins                     Kittie Plater, PT,  DPT Acute Rehabilitation Services Pager #: [phone] Office #: [phone]    Berline Lopes 01/17/2021, 2:18 PM

## 2021-01-17 NOTE — Care Management Important Message (Signed)
Important Message  Patient Details  Name: Vincent [name] MRN: [id number] Date of Birth: 09-13-1951   Medicare Important Message Given:  Yes     Orbie Pyo 01/17/2021, 2:58 PM

## 2021-01-17 NOTE — Progress Notes (Signed)
Occupational Therapy Treatment Patient Details Name: Vincent [name] MRN: [id number] DOB: [birth year] Today's Date: 01/17/2021    History of present illness 70 y/o male presented to ED at [facility] on 4/7 after being sent to [facility] for emergent MRI but unable to get to car. 2 weeks prior, patient had syncopal episode at PCP and has had 16 falls since. Patient presents with slurred speech, weakness, and confusion for one month. Unable to obtain MRI secondary to agitation. CT head and c-spine show chronic microvascular disease and severe C6 degenerative changes without acute process. PMH: anxiety, CAD, depression, DM type 2, HLD, HTN   OT comments  Pt making gradual progress towards OT goals this session. Pt continues to present with impaired balance, decreased activity tolerance and impaired ability to care for self. Pt completed sit<>stand from recliner with RW however pt unable to sequence side steps back to bed with RW, therefore removed distraction of RW and completed stand pivot transfer back to bed with MAX A +2 via face-to- face transfer. Pt talking throughout session but mostly nonsensically. Pt would continue to benefit from skilled occupational therapy while admitted and after d/c to address the below listed limitations in order to improve overall functional mobility and facilitate independence with BADL participation. DC plan remains appropriate, will follow acutely per POC.     Follow Up Recommendations  SNF;Supervision/Assistance - 24 hour    Equipment Recommendations  3 in 1 bedside commode    Recommendations for Other Services      Precautions / Restrictions Precautions Precautions: Fall Precaution Comments: wrist restraints Restrictions Weight Bearing Restrictions: No       Mobility Bed Mobility Overal bed mobility: Needs Assistance Bed Mobility: Sit to Supine Rolling: Max assist Sidelying to sit: Max assist;+2 for physical assistance   Sit to supine: Total assist;+2  for physical assistance   General bed mobility comments: total A +2 to lower pt back to bed with pt not initiating any active assist to elevate BLEs back to bed    Transfers Overall transfer level: Needs assistance Equipment used: Rolling walker (2 wheeled) Transfers: Sit to/from [employer] Sit to Stand: Max assist;+2 physical assistance Stand pivot transfers: Max assist;+2 physical assistance       General transfer comment: pt able sit<>stand from recliner with RW with MAX A +2 however pt unable to sequence pivotal steps with RW, deferred use of Rw and pt completes stand pivot transfer back to bed with face to face assist from COTA and tech.    Balance Overall balance assessment: Needs assistance Sitting-balance support: Bilateral upper extremity supported;Feet supported Sitting balance-Leahy Scale: Poor Sitting balance - Comments: MAX A for static sitting balance Postural control: Posterior lean Standing balance support: Bilateral upper extremity supported;During functional activity Standing balance-Leahy Scale: Zero Standing balance comment: reliant on UE support and external assist                           ADL either performed or assessed with clinical judgement   ADL Overall ADL's : Needs assistance/impaired                         Toilet Transfer: Maximal assistance;+2 for physical assistance;Stand-pivot Toilet Transfer Details (indicate cue type and reason): simulated via stand pivot transfer from recliner >EOB with MAX A +2         Functional mobility during ADLs: Maximal assistance;+2 for physical assistance General ADL  Comments: Pt limited by decreased cognition, tangentaial thoughts and nonsensically speech with difficulty attending to tasks. pt speaking nonsensically throughout session but rarely stating coherent words. pt following commands inconsistently d/t impaired atttention     Vision       Perception     Praxis       Cognition Arousal/Alertness: Awake/alert Behavior During Therapy: Impulsive;Anxious;Restless Overall Cognitive Status: No family/caregiver present to determine baseline cognitive functioning Area of Impairment: Attention;Memory;Following commands;Safety/judgement;Awareness;Problem solving                   Current Attention Level: Focused (easily distracted) Memory: Decreased short-term memory (needed to repeat same cues) Following Commands: Follows one step commands inconsistently Safety/Judgement: Decreased awareness of safety;Decreased awareness of deficits Awareness: Intellectual Problem Solving: Slow processing;Decreased initiation;Difficulty sequencing;Requires verbal cues;Requires tactile cues General Comments: pt speaking constantly during session but mostly nonsensically, poor awareness into deficits as pt very surprised about how much assist COTA was providing when this COTA eased up on the amount of assisted provided. pt following commands inconsistently and needed tactile cues at times to sequence mobility tasks        Exercises     Shoulder Instructions       General Comments VSS on RA    Pertinent Vitals/ Pain       Pain Assessment: Faces Faces Pain Scale: Hurts little more Pain Location: general discomfort with mobility Pain Descriptors / Indicators: Grimacing;Guarding;Moaning Pain Intervention(s): Limited activity within patient's tolerance;Monitored during session;Repositioned  Home Living                                          Prior Functioning/Environment              Frequency  Min 2X/week        Progress Toward Goals  OT Goals(current goals can now be found in the care plan section)  Progress towards OT goals: Progressing toward goals  Acute Rehab OT Goals Patient Stated Goal: none stated OT Goal Formulation: With patient Time For Goal Achievement: 01/28/21 Potential to Achieve Goals: Good  Plan Discharge  plan remains appropriate;Frequency remains appropriate    Co-evaluation                 AM-PAC OT "6 Clicks" Daily Activity     Outcome Measure   Help from another person eating meals?: A Little Help from another person taking care of personal grooming?: A Lot Help from another person toileting, which includes using toliet, bedpan, or urinal?: Total Help from another person bathing (including washing, rinsing, drying)?: Total Help from another person to put on and taking off regular upper body clothing?: A Lot Help from another person to put on and taking off regular lower body clothing?: Total 6 Click Score: 10    End of Session Equipment Utilized During Treatment: Gait belt;Rolling walker  OT Visit Diagnosis: Unsteadiness on feet (R26.81);Muscle weakness (generalized) (M62.81);Pain;Other symptoms and signs involving cognitive function Pain - part of body:  (general discomfort)   Activity Tolerance Patient tolerated treatment well   Patient Left in bed;with call bell/phone within reach;with bed alarm set;with restraints reapplied   Nurse Communication Mobility status        Time: 6948-5462 OT Time Calculation (min): 13 min  Charges: OT General Charges $OT Visit: 1 Visit OT Treatments $Therapeutic Activity: 8-22 mins  Harley Alto., COTA/L Acute Rehabilitation Services  Jenison Seth Higginbotham 01/17/2021, 4:15 PM

## 2021-01-17 NOTE — Plan of Care (Signed)
  Problem: Education: Goal: Knowledge of disease or condition will improve Outcome: Progressing Goal: Knowledge of secondary prevention will improve Outcome: Progressing Goal: Knowledge of patient specific risk factors addressed and post discharge goals established will improve Outcome: Progressing Goal: Individualized Educational Video(s) Outcome: Progressing   Problem: Coping: Goal: Will verbalize positive feelings about self Outcome: Progressing Goal: Will identify appropriate support needs Outcome: Progressing   Problem: Health Behavior/Discharge Planning: Goal: Ability to manage health-related needs will improve Outcome: Progressing   Problem: Self-Care: Goal: Ability to participate in self-care as condition permits will improve Outcome: Progressing Goal: Verbalization of feelings and concerns over difficulty with self-care will improve Outcome: Progressing Goal: Ability to communicate needs accurately will improve Outcome: Progressing   Problem: Nutrition: Goal: Risk of aspiration will decrease Outcome: Progressing Goal: Dietary intake will improve Outcome: Progressing   Problem: Ischemic Stroke/TIA Tissue Perfusion: Goal: Complications of ischemic stroke/TIA will be minimized Outcome: Progressing   Problem: Safety: Goal: Non-violent Restraint(s) Outcome: Progressing

## 2021-01-17 NOTE — NC FL2 (Signed)
[place] LEVEL OF CARE SCREENING TOOL     IDENTIFICATION  Patient Name: Vincent [name] Birthdate: 1951-08-22 Sex: male Admission Date (Current Location): 01/12/2021  [facility] [place] Number:  Herbalist and Address:  [facility], [place] [address] [place], [place] [address]      Provider Number: [id number]  Attending Physician Name and Address:  Thurnell Lose, MD  Relative Name and Phone Number:       Current Level of Care: Hospital Recommended Level of Care: [place] Prior Approval Number:    Date Approved/Denied:   PASRR Number:    Discharge Plan: SNF    Current Diagnoses: Patient Active Problem List   Diagnosis Date Noted  . CVA (cerebral vascular accident) ([place]) 01/12/2021  . Weakness   . Precordial pain   . Right knee pain 01/07/2018  . History of total right knee replacement 01/07/2018  . SOB (shortness of breath) 08/12/2016  . Tachycardia 08/12/2016  . Low grade fever 08/12/2016  . Osteoarthritis of right knee 08/09/2016  . OSA (obstructive sleep apnea) 11/29/2011  . Syncope 07/24/2011  . ARM NUMBNESS 06/19/2010  . EDEMA 03/13/2010  . FASTING HYPERGLYCEMIA 03/13/2010  . CORONARY ATHEROSCLEROSIS NATIVE CORONARY ARTERY 06/20/2009  . COLONIC POLYPS, ADENOMATOUS 01/31/2009  . HLD (hyperlipidemia) 01/31/2009  . Hypertension 01/31/2009  . CAD, UNSPECIFIED SITE 01/31/2009  . HEMORRHOIDS 01/31/2009  . DIVERTICULOSIS, MILD 01/31/2009    Orientation RESPIRATION BLADDER Height & Weight     Self  Normal Incontinent Weight:   Height:     BEHAVIORAL SYMPTOMS/MOOD NEUROLOGICAL BOWEL NUTRITION STATUS      Continent Diet (See DC Summary)  AMBULATORY STATUS COMMUNICATION OF NEEDS Skin   Total Care Verbally Normal                       Personal Care Assistance Level of Assistance  Bathing,Feeding,Dressing Bathing Assistance: Maximum assistance Feeding assistance: Maximum  assistance Dressing Assistance: Maximum assistance     Functional Limitations Info  Sight,Hearing,Speech Sight Info: Adequate Hearing Info: Adequate Speech Info: Impaired (Dysarthria)    SPECIAL CARE FACTORS FREQUENCY  PT (By licensed PT),OT (By licensed OT),Speech therapy     PT Frequency: 5x/week OT Frequency: 5x/week     Speech Therapy Frequency: 5x/week      Contractures Contractures Info: Not present    Additional Factors Info  Code Status,Allergies Code Status Info: Full Allergies Info: Penicillins           Current Medications (01/17/2021):  This is the current hospital active medication list Current Facility-Administered Medications  Medication Dose Route Frequency Provider Last Rate Last Admin  .  stroke: mapping our early stages of recovery book   Does not apply Once Wynetta Fines T, MD      . acetaminophen (TYLENOL) tablet 650 mg  650 mg Oral Q4H PRN Lequita Halt, MD   650 mg at 01/17/21 0515  . amLODipine (NORVASC) tablet 10 mg  10 mg Oral Daily Thurnell Lose, MD   10 mg at 01/17/21 1200  . aspirin EC tablet 81 mg  81 mg Oral Daily Wynetta Fines T, MD   81 mg at 01/17/21 1149  . cyanocobalamin ((VITAMIN B-12)) injection 1,000 mcg  1,000 mcg Subcutaneous Daily Jonetta Osgood, MD   1,000 mcg at 01/17/21 1149  . enoxaparin (LOVENOX) injection 40 mg  40 mg Subcutaneous Q24H Lequita Halt, MD   40 mg at  01/17/21 1150  . haloperidol lactate (HALDOL) injection 5 mg  5 mg Intravenous Q6H PRN Jonetta Osgood, MD   5 mg at 01/16/21 2122  . insulin aspart (novoLOG) injection 0-15 Units  0-15 Units Subcutaneous TID WC Lequita Halt, MD   3 Units at 01/17/21 1200  . isosorbide mononitrate (IMDUR) 24 hr tablet 30 mg  30 mg Oral Daily Wynetta Fines T, MD   30 mg at 01/17/21 1149  . LORazepam (ATIVAN) injection 2 mg  2 mg Intravenous Once Chotiner, Yevonne Aline, MD      . metoprolol tartrate (LOPRESSOR) tablet 50 mg  50 mg Oral BID Wynetta Fines T, MD   50 mg at 01/17/21 1149   . QUEtiapine (SEROQUEL) tablet 50 mg  50 mg Oral BID Jonetta Osgood, MD   50 mg at 01/17/21 1149  . senna-docusate (Senokot-S) tablet 1 tablet  1 tablet Oral QHS PRN Wynetta Fines T, MD      . simvastatin (ZOCOR) tablet 40 mg  40 mg Oral QHS Lequita Halt, MD   40 mg at 01/15/21 2247     Discharge Medications: Please see discharge summary for a list of discharge medications.  Relevant Imaging Results:  Relevant Lab Results:   Additional Information SSN: [id number]  Marney Setting, Student-Social Work

## 2021-01-17 NOTE — TOC Initial Note (Signed)
Transition of Care [facility]) - Initial/Assessment Note    Patient Details  Name: Vincent [name] MRN: [id number] Date of Birth: [birth year]  Transition of Care [facility]) CM/SW Contact:    Marney Setting, [place] Work Phone Number: 01/17/2021, 3:33 PM  Clinical Narrative:   MSw Student spoke with Pt. Wife about PT/OT recommending SNF. Wife is in agreement and wants to fax out to facilities in [place]. FL2 complete pending PASRR number. Will follow up.   Expected Discharge Plan: Skilled Nursing Facility Barriers to Discharge: Continued Medical Work up,Insurance Authorization   Patient Goals and CMS Choice Patient states their goals for this hospitalization and ongoing recovery are:: Unable to assess, oriented x1 CMS Medicare.gov Compare Post Acute Care list provided to:: Patient Represenative (must comment) Choice offered to / list presented to : Spouse  Expected Discharge Plan and Services Expected Discharge Plan: [place] arrangements for the past 2 months: Single Family Home                                      Prior Living Arrangements/Services Living arrangements for the past 2 months: Single Family Home Lives with:: Spouse Patient language and need for interpreter reviewed:: No Do you feel safe going back to the place where you live?: Yes      Need for Family Participation in Patient Care: Yes (Comment) Care giver support system in place?: No (comment)   Criminal Activity/Legal Involvement Pertinent to Current Situation/Hospitalization: No - Comment as needed  Activities of Daily Living Home Assistive Devices/Equipment: None ADL Screening (condition at time of admission) Patient's cognitive ability adequate to safely complete daily activities?: No Is the patient deaf or have difficulty hearing?: No Does the patient have difficulty seeing, even when wearing glasses/contacts?: No Does the patient have difficulty concentrating,  remembering, or making decisions?: No Patient able to express need for assistance with ADLs?: Yes Does the patient have difficulty dressing or bathing?: Yes Independently performs ADLs?: No Does the patient have difficulty walking or climbing stairs?: Yes Weakness of Legs: Right Weakness of Arms/Hands: None  Permission Sought/Granted Permission sought to share information with : Family Chief Financial Officer Permission granted to share information with : Yes, Verbal Permission Granted  Share Information with NAME: Vincent [name]  Permission granted to share info w AGENCY: SNF  Permission granted to share info w Relationship: Wife     Emotional Assessment Appearance:: Appears older than stated age Attitude/Demeanor/Rapport: Unable to Assess Affect (typically observed): Unable to Assess Orientation: : Oriented to Self Alcohol / Substance Use: Not Applicable Psych Involvement: No (comment)  Admission diagnosis:  Weakness [R53.1] Pain [R52] CVA (cerebral vascular accident) [facility]) [I63.9] Patient Active Problem List   Diagnosis Date Noted  . CVA (cerebral vascular accident) ([place]) 01/12/2021  . Weakness   . Precordial pain   . Right knee pain 01/07/2018  . History of total right knee replacement 01/07/2018  . SOB (shortness of breath) 08/12/2016  . Tachycardia 08/12/2016  . Low grade fever 08/12/2016  . Osteoarthritis of right knee 08/09/2016  . OSA (obstructive sleep apnea) 11/29/2011  . Syncope 07/24/2011  . ARM NUMBNESS 06/19/2010  . EDEMA 03/13/2010  . FASTING HYPERGLYCEMIA 03/13/2010  . CORONARY ATHEROSCLEROSIS NATIVE CORONARY ARTERY 06/20/2009  . COLONIC POLYPS, ADENOMATOUS 01/31/2009  . HLD (hyperlipidemia) 01/31/2009  . Hypertension 01/31/2009  . CAD, UNSPECIFIED SITE 01/31/2009  . HEMORRHOIDS 01/31/2009  .  DIVERTICULOSIS, MILD 01/31/2009   PCP:  Ginger Organ., MD Pharmacy:   [facility] [address], [place] [address] Phone: [phone] Fax: [phone]  [id number] [place], [place] [address] [place] [address] Phone: [phone] Fax: [phone]     Social Determinants of Health (SDOH) Interventions    Readmission Risk Interventions No flowsheet data found.

## 2021-01-17 NOTE — Progress Notes (Addendum)
PROGRESS NOTE        PATIENT DETAILS Name: Vincent [name] Age: 70 y.o. Sex: male Date of Birth: [birth year] Admit Date: 01/12/2021 Admitting Physician Lequita Halt, MD AUQ:[id number], Emily Filbert., MD  Brief Narrative: Patient is a 70 y.o. male with history of insulin-dependent DM-2, HTN, CAD, chronic systolic heart failure, chronic pain syndrome on narcotics/benzos-presented with a 2-week history of worsening confusion, visual hallucinations and frequent falls.  Spouse reports slurred speech for a few days as well.  Patient was subsequently admitted to the hospitalist service for further evaluation and treatment.  Significant events: 4/7>> admit for evaluation of slurred speech, confusion, hallucination, frequent falls  Significant studies: 4/7>> CT head: Negative for acute abnormalities 4/7>> CT C-spine: No fracture 4/7>> x-ray right knee: Right knee replacement without hardware loosening or fracture 4/8>> Echo: EF 45-50%, apical akinesis, grade 1 diastolic dysfunction.  Swirling of contrast at apex consistent with low flow but no thrombus, +ve WMA. 4/8>> vitamin B12: 72  Antimicrobial therapy: None  Microbiology data: None  Procedures : None  Consults: Neurology  DVT Prophylaxis : enoxaparin (LOVENOX) injection 40 mg Start: 01/13/21 1000   Subjective: Patient in bed, sleeping but appears to be in no distress, moving all 4 extremities, wife sitting bedside.  Assessment/Plan:  Encephalopathy/slurred speech/visual hallucinations: Probable polypharmacy-but need to rule out CVA due to slurred speech-echo findings (smoke seen)-vitamin B12 deficiency may be contributing as well-continue to hold benzos/narcotics-increased Seroquel to 50 mg twice daily-use Haldol as needed.  Clinically improving, MRI brain noted and DW Neuro 01/15/21, neurological changes likely due to prolonged B12 deficiency, according to the wife symptoms have been gradually progressive over  several months. Neurology following.   Incidental small right parietal stroke noted.  He is on aspirin and statin, no deficits contributed to this, continue to monitor.  May require placement.  LDL under 70, A1c mildly elevated.  Vitamin B12 deficiency: On B12 supplementation-recheck vitamin B12 levels in 3 months.  Combined chronic systolic and diastolic heart failure: Euvolemic-since slightly dehydrated on 01/15/2021 hold further diuretics and hydrate.  CAD-s/p PCI to LAD in 2007: No anginal symptoms-has EF 45% with apical akinesis on echo-most recent nuclear stress test in 2017 showed a fixed defect in the apex stable for outpatient follow-up with primary cardiologist.  Remains on aspirin. Will need outpt Cards follow up in 7-10 days post DC.  HTN: Blood pressure controlled-continue present regimen.  Holding ACE inhibitor due to AKI.  HLD: Continue statin-LDL 60.  Mild clinical dehydration with hyponatremia and AKI.  Hydrate with IV fluids and monitor.    Mild leukocytosis noted for 09/26/2021.  Stable chest x-ray, bladder scan stable, UA stable, afebrile we will continue to monitor.  Encourage staff to place him in chair and use I-S and flutter valve.  Addendum 5pm - low grade temps, now mild sustained S.Tach, no complaints, high risk for aspiration, CXR ? Infilterate, will place on IVF, ABX, BC 2 sets and monitor.    DM-2 (A1c 7.2 on 4/7): CBG stable with SSI-hold all oral hypoglycemic agents  Recent Labs    01/16/21 1559 01/16/21 2126 01/17/21 0614  GLUCAP 194* 189* 184*    Obesity: Estimated body mass index is 34.21 kg/m as calculated from the following:   Height as of 07/26/20: 5\' 8"  (1.727 m).   Weight as of 07/26/20: 102.1 kg.  Diet: Diet Order            DIET SOFT Room service appropriate? Yes; Fluid consistency: Thin  Diet effective now                  Code Status: Full code   Family Communication:  [id number] updated on 01/14/21,  message left at 10 AM on 01/15/2021, 01/16/21 updated, updated bedside 01/17/21  Disposition Plan: Status is: Inpatient  Remains inpatient appropriate because:Inpatient level of care appropriate due to severity of illness   Dispo: The patient is from: Home              Anticipated d/c is to: Home              Patient currently is not medically stable to d/c.   Difficult to place patient No   Barriers to Discharge: Ongoing altered mental status-awaiting further work-up-see above  Antimicrobial agents: Anti-infectives (From admission, onward)   None       Time spent:  35 minutes-Greater than 50% of this time was spent in counseling, explanation of diagnosis, planning of further management, and coordination of care.  MEDICATIONS:  Scheduled Meds: .  stroke: mapping our early stages of recovery book   Does not apply Once  . aspirin EC  81 mg Oral Daily  . cyanocobalamin  1,000 mcg Subcutaneous Daily  . enoxaparin (LOVENOX) injection  40 mg Subcutaneous Q24H  . insulin aspart  0-15 Units Subcutaneous TID WC  . isosorbide mononitrate  30 mg Oral Daily  . LORazepam  2 mg Intravenous Once  . metoprolol tartrate  50 mg Oral BID  . QUEtiapine  50 mg Oral BID  . simvastatin  40 mg Oral QHS   Continuous Infusions: . dextrose 5 % and 0.45 % NaCl with KCl 20 mEq/L 75 mL/hr at 01/17/21 0148   PRN Meds:.acetaminophen **OR** [DISCONTINUED] acetaminophen (TYLENOL) oral liquid 160 mg/5 mL **OR** [DISCONTINUED] acetaminophen, haloperidol lactate, senna-docusate   PHYSICAL EXAM: Vital signs: Vitals:   01/17/21 0404 01/17/21 0500 01/17/21 0600 01/17/21 0743  BP: (!) 156/84   (!) 157/92  Pulse: (!) 108   (!) 104  Resp: 19   20  Temp: 99.7 F (37.6 C) 99.5 F (37.5 C) 98.8 F (37.1 C) 98.8 F (37.1 C)  TempSrc: Oral Axillary Oral Oral  SpO2: 98%   98%   There were no vitals filed for this visit. There is no height or weight on file to calculate BMI.   Gen Exam:   Sleeping this  morning, moving all 4 extremities, [place].AT,PERRAL Supple Neck,No JVD, No cervical lymphadenopathy appriciated.  Symmetrical Chest wall movement, Good air movement bilaterally, CTAB RRR,No Gallops, Rubs or new Murmurs, No Parasternal Heave +ve B.Sounds, Abd Soft, No tenderness, No organomegaly appriciated, No rebound - guarding or rigidity. No Cyanosis, Clubbing or edema, No new Rash or bruise     I have personally reviewed following labs and imaging studies  LABORATORY DATA: CBC: Recent Labs  Lab 01/12/21 1548 01/14/21 0327 01/16/21 0254 01/17/21 0505  WBC 9.6 9.6 11.2* 12.9*  NEUTROABS  --   --  7.8* 9.5*  HGB 14.2 16.5 15.8 16.2  HCT 42.2 48.7 45.5 46.5  MCV 91.7 90.4 90.1 90.6  PLT 246 238 229 740    Basic Metabolic Panel: Recent Labs  Lab 01/12/21 1548 01/14/21 0327 01/15/21 0221 01/16/21 0254 01/17/21 0505  NA 136 138 132* 134* 138  K 4.2 4.1 4.1 4.3 4.4  CL 101 104  99 104 106  CO2 28 20* 19* 17* 17*  GLUCOSE 125* 130* 123* 153* 184*  BUN 9 15 22 21 16   CREATININE 0.96 1.12 1.28* 1.11 0.98  CALCIUM 9.4 9.8 9.2 9.4 9.7  MG  --   --   --  2.2 2.4    GFR: CrCl cannot be calculated (Unknown ideal weight.).  Liver Function Tests: Recent Labs  Lab 01/12/21 1548 01/14/21 0327 01/16/21 0254 01/17/21 0505  AST 16 21 19 16   ALT 13 15 14 12   ALKPHOS 96 103 102 112  BILITOT 0.7 2.1* 2.2* 2.6*  PROT 6.1* 7.3 6.7 6.9  ALBUMIN 3.8 4.3 3.8 3.7   No results for input(s): LIPASE, AMYLASE in the last 168 hours. Recent Labs  Lab 01/12/21 2221  AMMONIA 11   Lab Results  Component Value Date   HGBA1C 7.2 (H) 01/12/2021   Lab Results  Component Value Date   CHOL 118 01/12/2021   HDL 46 01/12/2021   LDLCALC 60 01/12/2021   TRIG 61 01/12/2021   CHOLHDL 2.6 01/12/2021    Coagulation Profile: No results for input(s): INR, PROTIME in the last 168 hours.  Cardiac Enzymes: No results for input(s): CKTOTAL, CKMB, CKMBINDEX, TROPONINI in the last 168  hours.  BNP (last 3 results) No results for input(s): PROBNP in the last 8760 hours.  Lipid Profile: No results for input(s): CHOL, HDL, LDLCALC, TRIG, CHOLHDL, LDLDIRECT in the last 72 hours.  Thyroid Function Tests: No results for input(s): TSH, T4TOTAL, FREET4, T3FREE, THYROIDAB in the last 72 hours.  Anemia Panel: No results for input(s): VITAMINB12, FOLATE, FERRITIN, TIBC, IRON, RETICCTPCT in the last 72 hours.  Urine analysis:    Component Value Date/Time   COLORURINE YELLOW 01/15/2021 1442   APPEARANCEUR CLEAR 01/15/2021 1442   LABSPEC 1.027 01/15/2021 1442   PHURINE 5.0 01/15/2021 1442   GLUCOSEU >=500 (A) 01/15/2021 1442   HGBUR NEGATIVE 01/15/2021 1442   BILIRUBINUR NEGATIVE 01/15/2021 1442   KETONESUR 80 (A) 01/15/2021 1442   PROTEINUR NEGATIVE 01/15/2021 1442   UROBILINOGEN 0.2 01/06/2014 1257   NITRITE NEGATIVE 01/15/2021 1442   LEUKOCYTESUR NEGATIVE 01/15/2021 1442    Sepsis Labs: Lactic Acid, Venous No results found for: LATICACIDVEN  MICROBIOLOGY: Recent Results (from the past 240 hour(s))  Resp Panel by RT-PCR (Flu A&B, Covid) Nasopharyngeal Swab     Status: None   Collection Time: 01/12/21  4:19 PM   Specimen: Nasopharyngeal Swab; Nasopharyngeal(NP) swabs in vial transport medium  Result Value Ref Range Status   SARS Coronavirus 2 by RT PCR NEGATIVE NEGATIVE Final    Comment: (NOTE) SARS-CoV-2 target nucleic acids are NOT DETECTED.  The SARS-CoV-2 RNA is generally detectable in upper respiratory specimens during the acute phase of infection. The lowest concentration of SARS-CoV-2 viral copies this assay can detect is 138 copies/mL. A negative result does not preclude SARS-Cov-2 infection and should not be used as the sole basis for treatment or other patient management decisions. A negative result may occur with  improper specimen collection/handling, submission of specimen other than nasopharyngeal swab, presence of viral mutation(s) within  the areas targeted by this assay, and inadequate number of viral copies(<138 copies/mL). A negative result must be combined with clinical observations, patient history, and epidemiological information. The expected result is Negative.  Fact Sheet for Patients:  EntrepreneurPulse.com.au  Fact Sheet for Healthcare Providers:  IncredibleEmployment.be  This test is no t yet approved or cleared by the [place] and  has been authorized for  detection and/or diagnosis of SARS-CoV-2 by FDA under an Emergency Use Authorization (EUA). This EUA will remain  in effect (meaning this test can be used) for the duration of the COVID-19 declaration under Section 564(b)(1) of the Act, 21 U.S.C.section 360bbb-3(b)(1), unless the authorization is terminated  or revoked sooner.       Influenza A by PCR NEGATIVE NEGATIVE Final   Influenza B by PCR NEGATIVE NEGATIVE Final    Comment: (NOTE) The Xpert Xpress SARS-CoV-2/FLU/RSV plus assay is intended as an aid in the diagnosis of influenza from Nasopharyngeal swab specimens and should not be used as a sole basis for treatment. Nasal washings and aspirates are unacceptable for Xpert Xpress SARS-CoV-2/FLU/RSV testing.  Fact Sheet for Patients: EntrepreneurPulse.com.au  Fact Sheet for Healthcare Providers: IncredibleEmployment.be  This test is not yet approved or cleared by the [place] FDA and has been authorized for detection and/or diagnosis of SARS-CoV-2 by FDA under an Emergency Use Authorization (EUA). This EUA will remain in effect (meaning this test can be used) for the duration of the COVID-19 declaration under Section 564(b)(1) of the Act, 21 U.S.C. section 360bbb-3(b)(1), unless the authorization is terminated or revoked.  Performed at [facility], [place] [address] [place], [place] [address]     RADIOLOGY STUDIES/RESULTS: MR BRAIN WO  CONTRAST  Result Date: 01/15/2021 CLINICAL DATA:  Headache, new or worsening.  Confusion. EXAM: MRI HEAD WITHOUT CONTRAST TECHNIQUE: Multiplanar, multiecho pulse sequences of the brain and surrounding structures were obtained without intravenous contrast. COMPARISON:  Head CT from 3 days ago. FINDINGS: Brain: Subcentimeter focus of weakly restricted diffusion in the parasagittal and subcortical right parietal lobe. Background of chronic small vessel ischemia with generalized ischemic gliosis in the cerebral white matter and pons. Small remote left cerebellar infarct. No masslike finding, hydrocephalus, atrophy, or collection. Vascular: Normal flow voids. Skull and upper cervical spine: Separate cervical MRI. No focal marrow lesion. Sinuses/Orbits: Negative Other: Truncated study due to patient confusion. IMPRESSION: 1. Small subacute infarct in the right parietal white matter, likely a current manifestation of the patient's extensive chronic small vessel ischemia. 2. No reversible finding. Electronically Signed   By: Monte Fantasia M.D.   On: 01/15/2021 12:01   MR CERVICAL SPINE WO CONTRAST  Result Date: 01/15/2021 CLINICAL DATA:  Confusion.  Frequent falls. EXAM: MRI CERVICAL SPINE WITHOUT CONTRAST TECHNIQUE: Multiplanar, multisequence MR imaging of the cervical spine was performed. No intravenous contrast was administered. COMPARISON:  CT cervical spine dated January 12, 2021. FINDINGS: Despite efforts by the technologist and patient, motion artifact is present on today's exam and could not be eliminated. This reduces exam sensitivity and specificity. The examination was also ended early and the axial medic images were not obtained. Alignment: Unchanged reversal of the normal cervical lordosis and trace anterolisthesis at C5-C6. Vertebrae: No fracture, evidence of discitis, or bone lesion. Cord: Normal signal and morphology. Posterior Fossa, vertebral arteries, paraspinal tissues: Chronic microvascular  ischemic changes in the pons. Otherwise negative. Disc levels: C2-C3:  Interbody and facet ankylosis.  No stenosis. C3-C4: No significant disc bulge or herniation. Mild-to-moderate bilateral facet arthropathy. Moderate right uncovertebral hypertrophy. Moderate right neuroforaminal stenosis. No spinal canal or left neuroforaminal stenosis. C4-C5: No significant disc bulge or herniation. Mild bilateral uncovertebral hypertrophy. Moderate left and mild right facet arthropathy. No stenosis. C5-C6: Severe disc height loss without significant disc bulge or herniation. Moderate bilateral facet uncovertebral hypertrophy. Mild bilateral neuroforaminal stenosis. No spinal canal stenosis. C6-C7: Mild disc bulging. Moderate bilateral uncovertebral hypertrophy.  No stenosis. C7-T1: Negative disc. Moderate right and mild left facet arthropathy. No stenosis. The visualized upper thoracic spine is unremarkable. IMPRESSION: 1. Normal cervical spinal cord. 2. Multilevel degenerative changes of the cervical spine as described above. No spinal canal stenosis at any level. 3. Moderate right neuroforaminal stenosis at C3-C4. Electronically Signed   By: Titus Dubin M.D.   On: 01/15/2021 12:26   DG Chest Port 1 View  Result Date: 01/17/2021 CLINICAL DATA:  Shortness of breath. EXAM: PORTABLE CHEST 1 VIEW COMPARISON:  05/12/2016 FINDINGS: Single view of the chest demonstrates low lung volumes. Heart size is normal but there is fullness of the central vascular structures. No discrete airspace disease or lung consolidation. Negative for a pneumothorax. IMPRESSION: 1. Low lung volumes without focal lung disease. 2. Slight prominence of the central vascular structures including the azygos shadow. These findings may be accentuated by the low lung volumes. Electronically Signed   By: Markus Daft M.D.   On: 01/17/2021 08:15     LOS: 5 days   Lala Lund, MD  [facility]  01/17/2021, 11:37 AM

## 2021-01-18 DIAGNOSIS — I10 Essential (primary) hypertension: Secondary | ICD-10-CM | POA: Diagnosis not present

## 2021-01-18 LAB — CBC WITH DIFFERENTIAL/PLATELET
Abs Immature Granulocytes: 0.08 10*3/uL — ABNORMAL HIGH (ref 0.00–0.07)
Basophils Absolute: 0 10*3/uL (ref 0.0–0.1)
Basophils Relative: 0 %
Eosinophils Absolute: 0 10*3/uL (ref 0.0–0.5)
Eosinophils Relative: 0 %
HCT: 44.2 % (ref 39.0–52.0)
Hemoglobin: 15 g/dL (ref 13.0–17.0)
Immature Granulocytes: 1 %
Lymphocytes Relative: 8 %
Lymphs Abs: 1 10*3/uL (ref 0.7–4.0)
MCH: 30.7 pg (ref 26.0–34.0)
MCHC: 33.9 g/dL (ref 30.0–36.0)
MCV: 90.6 fL (ref 80.0–100.0)
Monocytes Absolute: 1.3 10*3/uL — ABNORMAL HIGH (ref 0.1–1.0)
Monocytes Relative: 11 %
Neutro Abs: 9.5 10*3/uL — ABNORMAL HIGH (ref 1.7–7.7)
Neutrophils Relative %: 80 %
Platelets: 204 10*3/uL (ref 150–400)
RBC: 4.88 MIL/uL (ref 4.22–5.81)
RDW: 13.4 % (ref 11.5–15.5)
WBC: 11.9 10*3/uL — ABNORMAL HIGH (ref 4.0–10.5)
nRBC: 0 % (ref 0.0–0.2)

## 2021-01-18 LAB — MAGNESIUM: Magnesium: 2.1 mg/dL (ref 1.7–2.4)

## 2021-01-18 LAB — PROCALCITONIN: Procalcitonin: <0.1 ng/mL

## 2021-01-18 LAB — COMPREHENSIVE METABOLIC PANEL
ALT: 11 U/L (ref 0–44)
AST: 15 U/L (ref 15–41)
Albumin: 3.3 g/dL — ABNORMAL LOW (ref 3.5–5.0)
Alkaline Phosphatase: 103 U/L (ref 38–126)
Anion gap: 16 — ABNORMAL HIGH (ref 5–15)
BUN: 14 mg/dL (ref 8–23)
CO2: 14 mmol/L — ABNORMAL LOW (ref 22–32)
Calcium: 9.5 mg/dL (ref 8.9–10.3)
Chloride: 105 mmol/L (ref 98–111)
Creatinine, Ser: 0.96 mg/dL (ref 0.61–1.24)
GFR, Estimated: >60 mL/min (ref >60)
Glucose, Bld: 164 mg/dL — ABNORMAL HIGH (ref 70–99)
Potassium: 4.2 mmol/L (ref 3.5–5.1)
Sodium: 135 mmol/L (ref 135–145)
Total Bilirubin: 2.7 mg/dL — ABNORMAL HIGH (ref 0.3–1.2)
Total Protein: 6.6 g/dL (ref 6.5–8.1)

## 2021-01-18 LAB — BRAIN NATRIURETIC PEPTIDE: B Natriuretic Peptide: 35.2 pg/mL (ref 0.0–100.0)

## 2021-01-18 LAB — GLUCOSE, CAPILLARY
Glucose-Capillary: 160 mg/dL — ABNORMAL HIGH (ref 70–99)
Glucose-Capillary: 151 mg/dL — ABNORMAL HIGH (ref 70–99)
Glucose-Capillary: 156 mg/dL — ABNORMAL HIGH (ref 70–99)
Glucose-Capillary: 189 mg/dL — ABNORMAL HIGH (ref 70–99)

## 2021-01-18 MED ORDER — KCL IN DEXTROSE-NACL 20-5-0.45 MEQ/L-%-% IV SOLN
INTRAVENOUS | Status: DC
  Filled 2021-01-18: qty 1000

## 2021-01-18 MED ORDER — HYDRALAZINE HCL 20 MG/ML IJ SOLN
10.0000 mg | Freq: Four times a day (QID) | INTRAMUSCULAR | Status: DC | PRN
  Administered 2021-01-22: 10 mg via INTRAVENOUS
  Filled 2021-01-18: qty 1

## 2021-01-18 NOTE — Progress Notes (Deleted)
MSW Student provided bed offers to patients wife [name] who will look over them and get back to [place] tomorrow. Will follow up

## 2021-01-18 NOTE — Progress Notes (Signed)
Name: Vincent [name] DOB: 1951/07/18  Please be advised that the above-named patient will require a short-term nursing home stay -- anticipated 30 days or less for rehabilitation and strengthening. The plan is for return home.

## 2021-01-18 NOTE — Progress Notes (Signed)
SLP Cancellation Note  Patient Details Name: HELMUT HENNON MRN: [id number] DOB: 1951/06/13   Cancelled treatment:       Reason Eval/Treat Not Completed: Other (comment). MD cancelled cognitive linguistic orders and ordered a swallow eval. Pt has been tolerating diet per RN. SLP attempted to assist pt in sips of water but he was too confused and agitated to participate. Will try again next date.    Kaiah Hosea, Katherene Ponto 01/18/2021, 2:43 PM

## 2021-01-18 NOTE — Progress Notes (Signed)
PROGRESS NOTE        PATIENT DETAILS Name: Vincent [name] Age: 70 y.o. Sex: male Date of Birth: 05-17-1951 Admit Date: 01/12/2021 Admitting Physician Lequita Halt, MD YYT:KPTW, Emily Filbert., MD  Brief Narrative: Patient is a 70 y.o. male with history of insulin-dependent DM-2, HTN, CAD, chronic systolic heart failure, chronic pain syndrome on narcotics/benzos-presented with a 2-week history of worsening confusion, visual hallucinations and frequent falls.  Spouse reports slurred speech for a few days as well.  Patient was subsequently admitted to the hospitalist service for further evaluation and treatment.  Significant events: 4/7>> admit for evaluation of slurred speech, confusion, hallucination, frequent falls  Significant studies: 4/7>> CT head: Negative for acute abnormalities 4/7>> CT C-spine: No fracture 4/7>> x-ray right knee: Right knee replacement without hardware loosening or fracture 4/8>> Echo: EF 45-50%, apical akinesis, grade 1 diastolic dysfunction.  Swirling of contrast at apex consistent with low flow but no thrombus, +ve WMA. 4/8>> vitamin B12: 72  Antimicrobial therapy: None  Microbiology data: None  Procedures : None  Consults: Neurology  DVT Prophylaxis : enoxaparin (LOVENOX) injection 40 mg Start: 01/13/21 1000   Subjective: Patient alert, minor confused, dysarthric, still diffusely weak but denies any headache chest or abdominal pain.  Assessment/Plan:  Encephalopathy/slurred speech/visual hallucinations: Probable polypharmacy-but need to rule out CVA due to slurred speech-echo findings (smoke seen)-vitamin B12 deficiency may be contributing as well-continue to hold benzos/narcotics-increased Seroquel to 50 mg twice daily-use Haldol as needed.  Clinically improving, MRI brain noted and DW Neuro 01/15/21, neurological changes likely due to prolonged B12 deficiency, according to the wife symptoms have been gradually  progressive over several months. Neurology following.   Incidental small right parietal stroke noted.  He is on aspirin and statin, no deficits contributed to this, continue to monitor.  May require placement.  LDL under 70, A1c mildly elevated.  Vitamin B12 deficiency: On B12 supplementation-recheck vitamin B12 levels in 3 months.  Mild leukocytosis with low-grade fevers and tachycardia due to aspiration pneumonia in the hospital.   Chest x-ray positive sided infiltrate, clinically also suspicious aspiration pneumonia, placed on appropriate antibiotics on 01/17/21 based on his allergy profile.  Continue soft diet with feeding assistance and aspiration precautions, will involve speech as well.  Combined chronic systolic and diastolic heart failure: Euvolemic-since slightly dehydrated on 01/15/2021 hold further diuretics and hydrate.  CAD-s/p PCI to LAD in 2007: No anginal symptoms-has EF 45% with apical akinesis on echo-most recent nuclear stress test in 2017 showed a fixed defect in the apex stable for outpatient follow-up with primary cardiologist.  Remains on aspirin. Will need outpt Cards follow up in 7-10 days post DC.  HTN: Blood pressure controlled-continue present regimen.  Holding ACE inhibitor due to AKI.  HLD: Continue statin-LDL 60.  Mild clinical dehydration with hyponatremia and AKI.  Hydrate with IV fluids and monitor.    DM-2 (A1c 7.2 on 4/7): CBG stable with SSI-hold all oral hypoglycemic agents  Recent Labs    01/17/21 2106 01/18/21 0607 01/18/21 0810  GLUCAP 153* 160* 151*    Obesity: Estimated body mass index is 31.61 kg/m as calculated from the following:   Height as of this encounter: 5\' 8"  (1.727 m).   Weight as of this encounter: 94.3 kg.   Diet: Diet Order  DIET SOFT Room service appropriate? Yes; Fluid consistency: Thin  Diet effective now                  Code Status: Full code   Family Communication:  [phone]  updated on 01/14/21, message left at 10 AM on 01/15/2021, 01/16/21 updated, updated bedside 01/17/21  Disposition Plan: Status is: Inpatient  Remains inpatient appropriate because:Inpatient level of care appropriate due to severity of illness   Dispo: The patient is from: Home              Anticipated d/c is to: Home              Patient currently is not medically stable to d/c.   Difficult to place patient No   Barriers to Discharge: Ongoing altered mental status-awaiting further work-up-see above  Antimicrobial agents: Anti-infectives (From admission, onward)   Start     Dose/Rate Route Frequency Ordered Stop   01/17/21 1830  doxycycline (VIBRAMYCIN) 100 mg in sodium chloride 0.9 % 250 mL IVPB        100 mg 125 mL/hr over 120 Minutes Intravenous Every 12 hours 01/17/21 1730     01/17/21 1830  meropenem (MERREM) 1 g in sodium chloride 0.9 % 100 mL IVPB        1 g 200 mL/hr over 30 Minutes Intravenous Every 8 hours 01/17/21 1757         Time spent:  35 minutes-Greater than 50% of this time was spent in counseling, explanation of diagnosis, planning of further management, and coordination of care.  MEDICATIONS:  Scheduled Meds: .  stroke: mapping our early stages of recovery book   Does not apply Once  . amLODipine  10 mg Oral Daily  . aspirin EC  81 mg Oral Daily  . bisacodyl  10 mg Rectal Daily  . cyanocobalamin  1,000 mcg Subcutaneous Daily  . [START ON 01/20/2021] cyanocobalamin  1,000 mcg Subcutaneous Q30 days  . docusate sodium  200 mg Oral BID  . enoxaparin (LOVENOX) injection  40 mg Subcutaneous Q24H  . insulin aspart  0-15 Units Subcutaneous TID WC  . isosorbide mononitrate  30 mg Oral Daily  . LORazepam  2 mg Intravenous Once  . metoprolol tartrate  100 mg Oral BID  . polyethylene glycol  17 g Oral BID  . QUEtiapine  50 mg Oral BID  . simvastatin  40 mg Oral QHS   Continuous Infusions: . dextrose 5 % and 0.45 % NaCl with KCl 20 mEq/L Stopped (01/17/21 1812)  .  doxycycline (VIBRAMYCIN) IV 100 mg (01/18/21 0550)  . meropenem (MERREM) IV 1 g (01/18/21 1013)   PRN Meds:.acetaminophen **OR** [DISCONTINUED] acetaminophen (TYLENOL) oral liquid 160 mg/5 mL **OR** [DISCONTINUED] acetaminophen, haloperidol lactate, metoprolol tartrate, senna-docusate   PHYSICAL EXAM: Vital signs: Vitals:   01/17/21 2036 01/18/21 0000 01/18/21 0400 01/18/21 0812  BP: 131/76 134/81 (!) 144/77 (!) 156/81  Pulse: 99 (!) 105 (!) 102 (!) 107  Resp: 20 (!) 21 18 20   Temp: 98.2 F (36.8 C) 98 F (36.7 C) 98.8 F (37.1 C) 99.2 F (37.3 C)  TempSrc:  Oral Oral Oral  SpO2: 96% 97% 96% 95%  Weight:      Height:       Filed Weights   01/17/21 1749  Weight: 94.3 kg   Body mass index is 31.61 kg/m.   Gen Exam:   Awake  Mildly confused, No new F.N deficits, dysarthria [place].AT,PERRAL Supple Neck,No JVD, No cervical lymphadenopathy  appriciated.  Symmetrical Chest wall movement, Good air movement bilaterally, CTAB RRR,No Gallops, Rubs or new Murmurs, No Parasternal Heave +ve B.Sounds, Abd Soft, No tenderness, No organomegaly appriciated, No rebound - guarding or rigidity. No Cyanosis, Clubbing or edema, No new Rash or bruise   I have personally reviewed following labs and imaging studies  LABORATORY DATA: CBC: Recent Labs  Lab 01/12/21 1548 01/14/21 0327 01/16/21 0254 01/17/21 0505 01/18/21 0606  WBC 9.6 9.6 11.2* 12.9* 11.9*  NEUTROABS  --   --  7.8* 9.5* 9.5*  HGB 14.2 16.5 15.8 16.2 15.0  HCT 42.2 48.7 45.5 46.5 44.2  MCV 91.7 90.4 90.1 90.6 90.6  PLT 246 238 229 245 628    Basic Metabolic Panel: Recent Labs  Lab 01/14/21 0327 01/15/21 0221 01/16/21 0254 01/17/21 0505 01/18/21 0606  NA 138 132* 134* 138 135  K 4.1 4.1 4.3 4.4 4.2  CL 104 99 104 106 105  CO2 20* 19* 17* 17* 14*  GLUCOSE 130* 123* 153* 184* 164*  BUN 15 22 21 16 14   CREATININE 1.12 1.28* 1.11 0.98 0.96  CALCIUM 9.8 9.2 9.4 9.7 9.5  MG  --   --  2.2 2.4 2.1     GFR: Estimated Creatinine Clearance: 80.9 mL/min (by C-G formula based on SCr of 0.96 mg/dL).  Liver Function Tests: Recent Labs  Lab 01/12/21 1548 01/14/21 0327 01/16/21 0254 01/17/21 0505 01/18/21 0606  AST 16 21 19 16 15   ALT 13 15 14 12 11   ALKPHOS 96 103 102 112 103  BILITOT 0.7 2.1* 2.2* 2.6* 2.7*  PROT 6.1* 7.3 6.7 6.9 6.6  ALBUMIN 3.8 4.3 3.8 3.7 3.3*   No results for input(s): LIPASE, AMYLASE in the last 168 hours. Recent Labs  Lab 01/12/21 2221  AMMONIA 11   Lab Results  Component Value Date   HGBA1C 7.2 (H) 01/12/2021   Lab Results  Component Value Date   CHOL 118 01/12/2021   HDL 46 01/12/2021   LDLCALC 60 01/12/2021   TRIG 61 01/12/2021   CHOLHDL 2.6 01/12/2021    Coagulation Profile: No results for input(s): INR, PROTIME in the last 168 hours.  Cardiac Enzymes: No results for input(s): CKTOTAL, CKMB, CKMBINDEX, TROPONINI in the last 168 hours.  BNP (last 3 results) No results for input(s): PROBNP in the last 8760 hours.  Lipid Profile: No results for input(s): CHOL, HDL, LDLCALC, TRIG, CHOLHDL, LDLDIRECT in the last 72 hours.  Thyroid Function Tests: No results for input(s): TSH, T4TOTAL, FREET4, T3FREE, THYROIDAB in the last 72 hours.  Anemia Panel: No results for input(s): VITAMINB12, FOLATE, FERRITIN, TIBC, IRON, RETICCTPCT in the last 72 hours.  Urine analysis:    Component Value Date/Time   COLORURINE AMBER (A) 01/17/2021 1816   APPEARANCEUR CLEAR 01/17/2021 1816   LABSPEC 1.033 (H) 01/17/2021 1816   PHURINE 5.0 01/17/2021 1816   GLUCOSEU >=500 (A) 01/17/2021 1816   HGBUR NEGATIVE 01/17/2021 1816   BILIRUBINUR NEGATIVE 01/17/2021 1816   KETONESUR 80 (A) 01/17/2021 1816   PROTEINUR NEGATIVE 01/17/2021 1816   UROBILINOGEN 0.2 01/06/2014 1257   NITRITE NEGATIVE 01/17/2021 1816   LEUKOCYTESUR NEGATIVE 01/17/2021 1816    Sepsis Labs: Lactic Acid, Venous No results found for: LATICACIDVEN  MICROBIOLOGY: Recent Results  (from the past 240 hour(s))  Resp Panel by RT-PCR (Flu A&B, Covid) Nasopharyngeal Swab     Status: None   Collection Time: 01/12/21  4:19 PM   Specimen: Nasopharyngeal Swab; Nasopharyngeal(NP) swabs in vial transport medium  Result Value Ref Range Status   SARS Coronavirus 2 by RT PCR NEGATIVE NEGATIVE Final    Comment: (NOTE) SARS-CoV-2 target nucleic acids are NOT DETECTED.  The SARS-CoV-2 RNA is generally detectable in upper respiratory specimens during the acute phase of infection. The lowest concentration of SARS-CoV-2 viral copies this assay can detect is 138 copies/mL. A negative result does not preclude SARS-Cov-2 infection and should not be used as the sole basis for treatment or other patient management decisions. A negative result may occur with  improper specimen collection/handling, submission of specimen other than nasopharyngeal swab, presence of viral mutation(s) within the areas targeted by this assay, and inadequate number of viral copies(<138 copies/mL). A negative result must be combined with clinical observations, patient history, and epidemiological information. The expected result is Negative.  Fact Sheet for Patients:  EntrepreneurPulse.com.au  Fact Sheet for Healthcare Providers:  IncredibleEmployment.be  This test is no t yet approved or cleared by the [place] FDA and  has been authorized for detection and/or diagnosis of SARS-CoV-2 by FDA under an Emergency Use Authorization (EUA). This EUA will remain  in effect (meaning this test can be used) for the duration of the COVID-19 declaration under Section 564(b)(1) of the Act, 21 U.S.C.section 360bbb-3(b)(1), unless the authorization is terminated  or revoked sooner.       Influenza A by PCR NEGATIVE NEGATIVE Final   Influenza B by PCR NEGATIVE NEGATIVE Final    Comment: (NOTE) The Xpert Xpress SARS-CoV-2/FLU/RSV plus assay is intended as an aid in the  diagnosis of influenza from Nasopharyngeal swab specimens and should not be used as a sole basis for treatment. Nasal washings and aspirates are unacceptable for Xpert Xpress SARS-CoV-2/FLU/RSV testing.  Fact Sheet for Patients: EntrepreneurPulse.com.au  Fact Sheet for Healthcare Providers: IncredibleEmployment.be  This test is not yet approved or cleared by the [place] FDA and has been authorized for detection and/or diagnosis of SARS-CoV-2 by FDA under an Emergency Use Authorization (EUA). This EUA will remain in effect (meaning this test can be used) for the duration of the COVID-19 declaration under Section 564(b)(1) of the Act, 21 U.S.C. section 360bbb-3(b)(1), unless the authorization is terminated or revoked.  Performed at [facility], [place] [address] [place], [place] [address]   Culture, blood (routine x 2)     Status: None (Preliminary result)   Collection Time: 01/17/21  7:39 PM   Specimen: BLOOD RIGHT HAND  Result Value Ref Range Status   Specimen Description BLOOD RIGHT HAND  Final   Special Requests   Final    BOTTLES DRAWN AEROBIC AND ANAEROBIC Blood Culture adequate volume   Culture   Final    NO GROWTH < 12 HOURS Performed at [facility], [place] [address] [place], [place] [address]    Report Status PENDING  Incomplete  Culture, blood (routine x 2)     Status: None (Preliminary result)   Collection Time: 01/17/21  7:42 PM   Specimen: BLOOD  Result Value Ref Range Status   Specimen Description BLOOD RIGHT ANTECUBITAL  Final   Special Requests   Final    BOTTLES DRAWN AEROBIC AND ANAEROBIC Blood Culture adequate volume   Culture   Final    NO GROWTH < 12 HOURS Performed at [facility], [place] [address] [place], [place] [address]    Report Status PENDING  Incomplete    RADIOLOGY STUDIES/RESULTS: DG Chest Port 1 View  Result Date: 01/17/2021 CLINICAL DATA:  Short  of breath and nausea EXAM:  PORTABLE CHEST 1 VIEW COMPARISON:  01/17/2021 FINDINGS: Single frontal view of the chest demonstrates an unremarkable cardiac silhouette. Lung volumes are diminished with crowding of the central vasculature. There is developing consolidation within the medial right lung base which could reflect airspace disease or atelectasis. No effusion or pneumothorax. IMPRESSION: 1. Low lung volumes, with developing consolidation medial right lung base favor atelectasis. 2. Continued central vascular congestion. Electronically Signed   By: Randa Ngo M.D.   On: 01/17/2021 17:50   DG Chest Port 1 View  Result Date: 01/17/2021 CLINICAL DATA:  Shortness of breath. EXAM: PORTABLE CHEST 1 VIEW COMPARISON:  05/12/2016 FINDINGS: Single view of the chest demonstrates low lung volumes. Heart size is normal but there is fullness of the central vascular structures. No discrete airspace disease or lung consolidation. Negative for a pneumothorax. IMPRESSION: 1. Low lung volumes without focal lung disease. 2. Slight prominence of the central vascular structures including the azygos shadow. These findings may be accentuated by the low lung volumes. Electronically Signed   By: Markus Daft M.D.   On: 01/17/2021 08:15   DG Abd Portable 1V  Result Date: 01/17/2021 CLINICAL DATA:  Short of breath, nausea EXAM: PORTABLE ABDOMEN - 1 VIEW COMPARISON:  01/06/2014 FINDINGS: Supine frontal view of the abdomen and pelvis was performed, excluding the left flank and upper abdomen by collimation. Bowel gas pattern is unremarkable without obstruction or ileus. No masses or abnormal calcifications. Moderate right hip osteoarthritis with joint space narrowing, bony remodeling of the femoral head, and marked marginal osteophyte formation. This has progressed significantly since prior study. Stable mild left hip osteoarthritis. IMPRESSION: 1. Unremarkable bowel gas pattern. 2. Moderate to severe progressive right hip osteoarthritis. Electronically  Signed   By: Randa Ngo M.D.   On: 01/17/2021 17:49     LOS: 6 days   Lala Lund, MD  [facility]  01/18/2021, 11:28 AM

## 2021-01-18 NOTE — TOC Progression Note (Signed)
Transition of Care [facility]) - Progression Note    Patient Details  Name: Vincent [name] MRN: [id number] Date of Birth: [birth year]  Transition of Care [facility]) CM/SW Contact  Marney [place], [place] Work Phone Number: 01/18/2021, 3:53 PM  Clinical Narrative:   MSW Student provided bed offers to patients wife Vincent [name] who will look over them and get back to [place] tomorrow. Will follow up    Expected Discharge Plan: [place] Barriers to Discharge: Continued Medical Work up,Insurance Authorization  Expected Discharge Plan and Services Expected Discharge Plan: [place] arrangements for the past 2 months: Single Family Home                                       Social Determinants of Health (SDOH) Interventions    Readmission Risk Interventions No flowsheet data found.

## 2021-01-19 DIAGNOSIS — R531 Weakness: Secondary | ICD-10-CM | POA: Diagnosis not present

## 2021-01-19 DIAGNOSIS — I10 Essential (primary) hypertension: Secondary | ICD-10-CM | POA: Diagnosis not present

## 2021-01-19 LAB — URINE CULTURE: Culture: >=100000 — AB

## 2021-01-19 LAB — CBC WITH DIFFERENTIAL/PLATELET
Abs Immature Granulocytes: 0.05 10*3/uL (ref 0.00–0.07)
Basophils Absolute: 0 10*3/uL (ref 0.0–0.1)
Basophils Relative: 0 %
Eosinophils Absolute: 0 10*3/uL (ref 0.0–0.5)
Eosinophils Relative: 0 %
HCT: 43.2 % (ref 39.0–52.0)
Hemoglobin: 14.8 g/dL (ref 13.0–17.0)
Immature Granulocytes: 0 %
Lymphocytes Relative: 13 %
Lymphs Abs: 1.5 10*3/uL (ref 0.7–4.0)
MCH: 30.5 pg (ref 26.0–34.0)
MCHC: 34.3 g/dL (ref 30.0–36.0)
MCV: 89.1 fL (ref 80.0–100.0)
Monocytes Absolute: 1.6 10*3/uL — ABNORMAL HIGH (ref 0.1–1.0)
Monocytes Relative: 14 %
Neutro Abs: 8.5 10*3/uL — ABNORMAL HIGH (ref 1.7–7.7)
Neutrophils Relative %: 73 %
Platelets: 234 10*3/uL (ref 150–400)
RBC: 4.85 MIL/uL (ref 4.22–5.81)
RDW: 13.6 % (ref 11.5–15.5)
WBC: 11.7 10*3/uL — ABNORMAL HIGH (ref 4.0–10.5)
nRBC: 0 % (ref 0.0–0.2)

## 2021-01-19 LAB — GLUCOSE, CAPILLARY
Glucose-Capillary: 144 mg/dL — ABNORMAL HIGH (ref 70–99)
Glucose-Capillary: 159 mg/dL — ABNORMAL HIGH (ref 70–99)
Glucose-Capillary: 164 mg/dL — ABNORMAL HIGH (ref 70–99)
Glucose-Capillary: 165 mg/dL — ABNORMAL HIGH (ref 70–99)
Glucose-Capillary: 172 mg/dL — ABNORMAL HIGH (ref 70–99)
Glucose-Capillary: 177 mg/dL — ABNORMAL HIGH (ref 70–99)
Glucose-Capillary: 183 mg/dL — ABNORMAL HIGH (ref 70–99)

## 2021-01-19 LAB — COMPREHENSIVE METABOLIC PANEL
ALT: 8 U/L (ref 0–44)
AST: 26 U/L (ref 15–41)
Albumin: 3.2 g/dL — ABNORMAL LOW (ref 3.5–5.0)
Alkaline Phosphatase: 98 U/L (ref 38–126)
Anion gap: 12 (ref 5–15)
BUN: 20 mg/dL (ref 8–23)
CO2: 17 mmol/L — ABNORMAL LOW (ref 22–32)
Calcium: 9.6 mg/dL (ref 8.9–10.3)
Chloride: 105 mmol/L (ref 98–111)
Creatinine, Ser: 0.94 mg/dL (ref 0.61–1.24)
GFR, Estimated: >60 mL/min (ref >60)
Glucose, Bld: 170 mg/dL — ABNORMAL HIGH (ref 70–99)
Potassium: 5.7 mmol/L — ABNORMAL HIGH (ref 3.5–5.1)
Sodium: 134 mmol/L — ABNORMAL LOW (ref 135–145)
Total Bilirubin: 2.9 mg/dL — ABNORMAL HIGH (ref 0.3–1.2)
Total Protein: 6.3 g/dL — ABNORMAL LOW (ref 6.5–8.1)

## 2021-01-19 LAB — MAGNESIUM: Magnesium: 2.4 mg/dL (ref 1.7–2.4)

## 2021-01-19 LAB — PROCALCITONIN: Procalcitonin: <0.1 ng/mL

## 2021-01-19 LAB — BRAIN NATRIURETIC PEPTIDE: B Natriuretic Peptide: 34.8 pg/mL (ref 0.0–100.0)

## 2021-01-19 LAB — POTASSIUM: Potassium: 4 mmol/L (ref 3.5–5.1)

## 2021-01-19 MED ORDER — SODIUM POLYSTYRENE SULFONATE 15 GM/60ML PO SUSP: 30.0000 g | Freq: Once | ORAL | Status: DC

## 2021-01-19 MED ORDER — THIAMINE HCL 100 MG PO TABS
100.0000 mg | ORAL_TABLET | Freq: Every day | ORAL | Status: DC
  Administered 2021-01-19 – 2021-01-27 (×9): 100 mg via ORAL
  Filled 2021-01-19 (×10): qty 1

## 2021-01-19 MED ORDER — SODIUM ZIRCONIUM CYCLOSILICATE 10 G PO PACK: 10.0000 g | PACK | Freq: Two times a day (BID) | ORAL | Status: DC

## 2021-01-19 MED ORDER — DOXYCYCLINE HYCLATE 100 MG PO TABS
100.0000 mg | ORAL_TABLET | Freq: Two times a day (BID) | ORAL | Status: DC
  Administered 2021-01-19 – 2021-01-21 (×4): 100 mg via ORAL
  Filled 2021-01-19 (×5): qty 1

## 2021-01-19 MED ORDER — ADULT MULTIVITAMIN W/MINERALS CH
1.0000 | ORAL_TABLET | Freq: Every day | ORAL | Status: DC
  Administered 2021-01-19 – 2021-01-27 (×8): 1 via ORAL
  Filled 2021-01-19 (×9): qty 1

## 2021-01-19 NOTE — Plan of Care (Signed)
  Problem: Education: Goal: Knowledge of disease or condition will improve Outcome: Progressing Goal: Knowledge of secondary prevention will improve Outcome: Progressing Goal: Knowledge of patient specific risk factors addressed and post discharge goals established will improve Outcome: Progressing Goal: Individualized Educational Video(s) Outcome: Progressing   Problem: Coping: Goal: Will verbalize positive feelings about self Outcome: Progressing Goal: Will identify appropriate support needs Outcome: Progressing   Problem: Health Behavior/Discharge Planning: Goal: Ability to manage health-related needs will improve Outcome: Progressing   Problem: Self-Care: Goal: Ability to participate in self-care as condition permits will improve Outcome: Progressing Goal: Verbalization of feelings and concerns over difficulty with self-care will improve Outcome: Progressing Goal: Ability to communicate needs accurately will improve Outcome: Progressing   Problem: Nutrition: Goal: Risk of aspiration will decrease Outcome: Progressing Goal: Dietary intake will improve Outcome: Progressing   Problem: Ischemic Stroke/TIA Tissue Perfusion: Goal: Complications of ischemic stroke/TIA will be minimized Outcome: Progressing   Problem: Safety: Goal: Non-violent Restraint(s) Outcome: Progressing

## 2021-01-19 NOTE — Progress Notes (Signed)
Subjective: Continues to have some confusion.   Exam: Vitals:   01/19/21 0805 01/19/21 1027  BP: (!) 152/88 (!) 134/108  Pulse: (!) 112 82  Resp: 19   Temp: 97.7 F (36.5 C)   SpO2: 97%    Gen: In bed, NAD Resp: non-labored breathing, no acute distress Abd: soft, nt  Neuro: MS: Awake, alert, gives the month as October but just the year correct at 2022. CN: Pupils equal round and reactive to light, extraocular movements intact Motor: When I initially asked him to move his legs, he does not, however after repeated prompting he actually has good strength in bilateral lower extremities Sensory: He reports intact sensation, is correctly able to sense up and downgoing toes. DTR: Diminished in bilateral lower extremities  Pertinent Labs: B12 72 from admission TSH, RPR, ammonia -normal  MRI with periventricular stroke  Impression: 70 year old male with progressive falls and confusion in the setting of severe B12 deficiency.  Though it is described as sometimes occurring, B12 deficiency does not typically cause dysarthria and therefore I suspect that this is possibly due to his stroke with his other symptoms likely being more related to the B12 deficiency.   We do not have any definite evidence of other vitamin deficiencies, but given that he has one, I would favor starting at least a multivitamin and oral thiamine supplementation.  Recommendations: 1) continue statin, antiplatelet therapy 2) continue B12 supplementation 3) I will also start a multivitamin and thiamine supplementation as well.  4) Would continue supportive care, would expect gradual, possibly incomplete recovery.   Roland Rack, MD [facility] [phone]  If 7pm- 7am, please page neurology on call as listed in [place].

## 2021-01-19 NOTE — TOC Progression Note (Signed)
Transition of Care [facility]) - Progression Note    Patient Details  Name: Vincent [name] MRN: [id number] Date of Birth: 07/07/1951  Transition of Care [facility]) CM/SW [place], [place] Phone Number: 01/19/2021, 11:19 AM  Clinical Narrative:   CSW met with wife at bedside, provided bed offers printed out because she forgot to write them down when given them yesterday. Wife said that the patient looks better today, if he keeps improving then she might take him home instead, but wants to see how he keeps doing before making a final decision. Wife will review SNF choices and get back to CSW with decision. Patient not medically stable at this time, still in restraints today. CSW to follow.    Expected Discharge Plan: Skilled Nursing Facility Barriers to Discharge: Continued Medical Work up,Insurance Authorization,Facility will not accept until restraint criteria met,Awaiting State Approval Forensic scientist)  Expected Discharge Plan and Services Expected Discharge Plan: [place] arrangements for the past 2 months: Single Family Home                                       Social Determinants of Health (SDOH) Interventions    Readmission Risk Interventions No flowsheet data found.

## 2021-01-19 NOTE — Progress Notes (Signed)
Physical Therapy Treatment Patient Details Name: Vincent [name] MRN: [id number] DOB: 12-30-1950 Today's Date: 01/19/2021    History of Present Illness 70 y/o male presented to ED at [facility] on 4/7 after being sent to [facility] for emergent MRI but unable to get to car. 2 weeks prior, patient had syncopal episode at PCP and has had 16 falls since. Patient presents with slurred speech, weakness, and confusion for one month. Unable to obtain MRI secondary to agitation. CT head and c-spine show chronic microvascular disease and severe C6 degenerative changes without acute process. PMH: anxiety, CAD, depression, DM type 2, HLD, HTN    PT Comments    Patient agreeable to PT/OT session. Patient overall totalA+2 for bed mobility with poor initiation, attention, and unable to follow commands. Required maxA to maintain sitting balance EOB with heavy posterior and R lateral lean. Patient with tangential speech but unintelligible throughout. Continue to recommend SNF for ongoing Physical Therapy.       Follow Up Recommendations  SNF;Supervision/Assistance - 24 hour     Equipment Recommendations  None recommended by PT    Recommendations for Other Services       Precautions / Restrictions Precautions Precautions: Fall Precaution Comments: wrist restraints Restrictions Weight Bearing Restrictions: No    Mobility  Bed Mobility Overal bed mobility: Needs Assistance Bed Mobility: Supine to Sit;Sit to Supine Rolling: Max assist   Supine to sit: Total assist;+2 for physical assistance;+2 for safety/equipment Sit to supine: Total assist;+2 for physical assistance;+2 for safety/equipment   General bed mobility comments: max assist to roll for pad readjustment, total assist +2 for transition to/from EOB as patient with poor initation, attention and command following to engage in task    Transfers                 General transfer comment: deferred due to safety and poor sitting  balance  Ambulation/Gait                 Stairs             Wheelchair Mobility    Modified Rankin (Stroke Patients Only) Modified Rankin (Stroke Patients Only) Pre-Morbid Rankin Score: Moderate disability Modified Rankin: Severe disability     Balance Overall balance assessment: Needs assistance Sitting-balance support: Bilateral upper extremity supported;Feet supported Sitting balance-Leahy Scale: Zero Sitting balance - Comments: max assist for static sitting balance, at best mod assist briefly with R UE supported on baseboard of bed Postural control: Right lateral lean;Posterior lean                                  Cognition Arousal/Alertness: Awake/alert Behavior During Therapy: Impulsive;Anxious;Restless Overall Cognitive Status: No family/caregiver present to determine baseline cognitive functioning Area of Impairment: Attention;Memory;Following commands;Safety/judgement;Awareness;Problem solving;Orientation                 Orientation Level: Disoriented to;Situation Current Attention Level: Focused Memory: Decreased recall of precautions;Decreased short-term memory Following Commands: Follows one step commands inconsistently;Follows one step commands with increased time Safety/Judgement: Decreased awareness of safety;Decreased awareness of deficits Awareness: Intellectual Problem Solving: Slow processing;Decreased initiation;Difficulty sequencing;Requires verbal cues;Requires tactile cues General Comments: patient presenting with slow processing and requires increased time for simple command following      Exercises      General Comments        Pertinent Vitals/Pain Pain Assessment: Faces Faces Pain Scale: Hurts little more Pain Location: back  Pain Descriptors / Indicators: Discomfort;Grimacing;Guarding Pain Intervention(s): Limited activity within patient's tolerance;Monitored during session;Repositioned    Home Living                       Prior Function            PT Goals (current goals can now be found in the care plan section) Acute Rehab PT Goals Patient Stated Goal: none stated PT Goal Formulation: With patient/family Time For Goal Achievement: 01/28/21 Potential to Achieve Goals: Fair Progress towards PT goals: Not progressing toward goals - comment (on 4/9, patient ambulating with minA+2, now on 4/14 totalA+2 for bed mobility and maxA for sitting balance)    Frequency    Min 3X/week      PT Plan Current plan remains appropriate    Co-evaluation PT/OT/SLP Co-Evaluation/Treatment: Yes Reason for Co-Treatment: Necessary to address cognition/behavior during functional activity;For patient/therapist safety;To address functional/ADL transfers PT goals addressed during session: Mobility/safety with mobility;Balance OT goals addressed during session: ADL's and self-care      AM-PAC PT "6 Clicks" Mobility   Outcome Measure  Help needed turning from your back to your side while in a flat bed without using bedrails?: Total Help needed moving from lying on your back to sitting on the side of a flat bed without using bedrails?: Total Help needed moving to and from a bed to a chair (including a wheelchair)?: Total Help needed standing up from a chair using your arms (e.g., wheelchair or bedside chair)?: Total Help needed to walk in hospital room?: Total Help needed climbing 3-5 steps with a railing? : Total 6 Click Score: 6    End of Session   Activity Tolerance: Patient tolerated treatment well Patient left: in bed;with call bell/phone within reach;with bed alarm set;with restraints reapplied Nurse Communication: Mobility status PT Visit Diagnosis: Unsteadiness on feet (R26.81);Muscle weakness (generalized) (M62.81);Repeated falls (R29.6);Other abnormalities of gait and mobility (R26.89)     Time: 1696-7893 PT Time Calculation (min) (ACUTE ONLY): 24 min  Charges:   $Therapeutic Activity: 8-22 mins                     Ioane Bhola A. Gilford Rile PT, DPT Acute Rehabilitation Services Pager [phone] Office [phone]    Linna Hoff 01/19/2021, 4:31 PM

## 2021-01-19 NOTE — Progress Notes (Signed)
Occupational Therapy Treatment Patient Details Name: Vincent [name] MRN: [id number] DOB: 1951/09/24 Today's Date: 01/19/2021    History of present illness 70 y/o male presented to ED at [facility] on 4/7 after being sent to [facility] for emergent MRI but unable to get to car. 2 weeks prior, patient had syncopal episode at PCP and has had 16 falls since. Patient presents with slurred speech, weakness, and confusion for one month. Unable to obtain MRI secondary to agitation. CT head and c-spine show chronic microvascular disease and severe C6 degenerative changes without acute process. PMH: anxiety, CAD, depression, DM type 2, HLD, HTN   OT comments  Patient supine in bed, engaged in OT/PT session.  Requires min assist to wash face in supine, with cueing for task attention and min assist for thoroughness using L hand.  Overall, he requires total assist +2 to transition to/from EOB.  Pt with poor initiation, sequencing and command following.  Sitting EOB requires max assist to maintain static sitting balance with heavy posterior and R lean, able to progress briefly to mod assist but unable to sustain.  Patient remains with slurred speech and difficult to understand, requires re-orientation to situation. Will follow acutely. SNF remains appropriate.     Follow Up Recommendations  SNF;Supervision/Assistance - 24 hour    Equipment Recommendations  3 in 1 bedside commode    Recommendations for Other Services      Precautions / Restrictions Precautions Precautions: Fall Precaution Comments: wrist restraints Restrictions Weight Bearing Restrictions: No       Mobility Bed Mobility Overal bed mobility: Needs Assistance Bed Mobility: Supine to Sit;Sit to Supine Rolling: Max assist   Supine to sit: Total assist;+2 for physical assistance;+2 for safety/equipment Sit to supine: Total assist;+2 for physical assistance;+2 for safety/equipment   General bed mobility comments: max assist to roll to fix  pads, total assist +2 for transition to/from EOB as patient with poor initation, attention and command following to engage in task    Transfers                 General transfer comment: deferred due to safety    Balance Overall balance assessment: Needs assistance Sitting-balance support: Bilateral upper extremity supported;Feet supported Sitting balance-Leahy Scale: Zero Sitting balance - Comments: max assist for static sitting balance, at best mod assist briefly with R UE supported on baseboard of bed Postural control: Right lateral lean;Posterior lean                                 ADL either performed or assessed with clinical judgement   ADL Overall ADL's : Needs assistance/impaired     Grooming: Wash/dry face;Bed level;Minimal assistance Grooming Details (indicate cue type and reason): min assist to wash face with L hand, increased time and assist for thoroughness             Lower Body Dressing: Total assistance;+2 for physical assistance;+2 for safety/equipment;Bed level     Toilet Transfer Details (indicate cue type and reason): deferred         Functional mobility during ADLs: Total assistance;+2 for physical assistance;+2 for safety/equipment General ADL Comments: pt limited by cognition, impaired balance and weakness     Vision       Perception     Praxis      Cognition Arousal/Alertness: Awake/alert Behavior During Therapy: Impulsive;Anxious;Restless Overall Cognitive Status: No family/caregiver present to determine baseline cognitive functioning Area  of Impairment: Attention;Memory;Following commands;Safety/judgement;Awareness;Problem solving;Orientation                 Orientation Level: Disoriented to;Situation Current Attention Level: Focused Memory: Decreased recall of precautions;Decreased short-term memory Following Commands: Follows one step commands inconsistently;Follows one step commands with increased  time Safety/Judgement: Decreased awareness of safety;Decreased awareness of deficits Awareness: Intellectual Problem Solving: Slow processing;Decreased initiation;Difficulty sequencing;Requires verbal cues;Requires tactile cues General Comments: patient presenting with slow processing and requires increased time for simple command following        Exercises     Shoulder Instructions       General Comments      Pertinent Vitals/ Pain       Pain Assessment: Faces Faces Pain Scale: Hurts little more Pain Location: back Pain Descriptors / Indicators: Discomfort;Grimacing;Guarding Pain Intervention(s): Limited activity within patient's tolerance;Monitored during session;Repositioned  Home Living                                          Prior Functioning/Environment              Frequency  Min 2X/week        Progress Toward Goals  OT Goals(current goals can now be found in the care plan section)  Progress towards OT goals: Not progressing toward goals - comment (cognition, increased physical assist)  Acute Rehab OT Goals Patient Stated Goal: none stated  Plan Discharge plan remains appropriate;Frequency remains appropriate    Co-evaluation    PT/OT/SLP Co-Evaluation/Treatment: Yes Reason for Co-Treatment: Necessary to address cognition/behavior during functional activity;For patient/therapist safety;To address functional/ADL transfers   OT goals addressed during session: ADL's and self-care      AM-PAC OT "6 Clicks" Daily Activity     Outcome Measure   Help from another person eating meals?: A Little Help from another person taking care of personal grooming?: A Lot Help from another person toileting, which includes using toliet, bedpan, or urinal?: Total Help from another person bathing (including washing, rinsing, drying)?: A Lot Help from another person to put on and taking off regular upper body clothing?: A Lot Help from another person  to put on and taking off regular lower body clothing?: Total 6 Click Score: 11    End of Session    OT Visit Diagnosis: Unsteadiness on feet (R26.81);Muscle weakness (generalized) (M62.81);Pain;Other symptoms and signs involving cognitive function Pain - part of body:  (back)   Activity Tolerance Patient tolerated treatment well   Patient Left in bed;with call bell/phone within reach;with bed alarm set;with restraints reapplied   Nurse Communication Mobility status        Time: 1420-1444 OT Time Calculation (min): 24 min  Charges: OT General Charges $OT Visit: 1 Visit OT Treatments $Self Care/Home Management : 8-22 mins  Jolaine Artist, OT [place] Pager [phone] Office [place] 01/19/2021, 3:40 PM

## 2021-01-19 NOTE — Progress Notes (Signed)
PROGRESS NOTE        PATIENT DETAILS Name: Vincent [name] Age: 70 y.o. Sex: male Date of Birth: 1950-11-22 Admit Date: 01/12/2021 Admitting Physician Lequita Halt, MD YPP:JKDT, Emily Filbert., MD  Brief Narrative: Patient is a 70 y.o. male with history of insulin-dependent DM-2, HTN, CAD, chronic systolic heart failure, chronic pain syndrome on narcotics/benzos-presented with a 2-week history of worsening confusion, visual hallucinations and frequent falls.  Spouse reports slurred speech for a few days as well.  Patient was subsequently admitted to the hospitalist service for further evaluation and treatment.  Significant events: 4/7>> admit for evaluation of slurred speech, confusion, hallucination, frequent falls  Significant studies: 4/7>> CT head: Negative for acute abnormalities 4/7>> CT C-spine: No fracture 4/7>> x-ray right knee: Right knee replacement without hardware loosening or fracture 4/8>> Echo: EF 45-50%, apical akinesis, grade 1 diastolic dysfunction.  Swirling of contrast at apex consistent with low flow but no thrombus, +ve WMA. 4/8>> vitamin B12: 72  Antimicrobial therapy: None  Microbiology data: None  Procedures : None  Consults: Neurology  DVT Prophylaxis : enoxaparin (LOVENOX) injection 40 mg Start: 01/13/21 1000   Subjective: Patient in bed, more awake today, denies any headache chest or abdominal pain, continues to have extreme weakness in lower extremities  Assessment/Plan:  Encephalopathy/slurred speech/visual hallucinations: Probable polypharmacy-but need to rule out CVA due to slurred speech-echo findings (smoke seen)-vitamin B12 deficiency may be contributing as well-continue to hold benzos/narcotics-increased Seroquel to 50 mg twice daily-use Haldol as needed.  Clinically improving, MRI brain noted and DW Neuro 01/15/21, neurological changes likely due to prolonged B12 deficiency, according to the wife symptoms have  been gradually progressive over several months. Neurology following.   Incidental small right parietal stroke noted.  He is on aspirin and statin, no deficits contributed to this, continue to monitor.  May require placement.  LDL under 70, A1c mildly elevated.  Vitamin B12 deficiency: On B12 supplementation-recheck vitamin B12 levels in 3 months.  Mild leukocytosis with low-grade fevers and tachycardia due to aspiration pneumonia in the hospital.   Chest x-ray positive sided infiltrate, clinically also suspicious aspiration pneumonia, placed on appropriate antibiotics on 01/17/21 based on his allergy profile.  Continue soft diet with feeding assistance and aspiration precautions, will involve speech as well.  Combined chronic systolic and diastolic heart failure: Euvolemic-since slightly dehydrated on 01/15/2021 hold further diuretics and hydrate.  CAD-s/p PCI to LAD in 2007: No anginal symptoms-has EF 45% with apical akinesis on echo-most recent nuclear stress test in 2017 showed a fixed defect in the apex stable for outpatient follow-up with primary cardiologist.  Remains on aspirin. Will need outpt Cards follow up in 7-10 days post DC.  HTN: Blood pressure controlled-continue present regimen.  Holding ACE inhibitor due to AKI.  HLD: Continue statin-LDL 60.  Mild clinical dehydration with hyponatremia and AKI.  Hydrate with IV fluids and monitor.    DM-2 (A1c 7.2 on 4/7): CBG stable with SSI-hold all oral hypoglycemic agents  Recent Labs    01/19/21 0018 01/19/21 0613 01/19/21 0827  GLUCAP 164* 172* 144*    Obesity: Estimated body mass index is 31.61 kg/m as calculated from the following:   Height as of this encounter: 5\' 8"  (1.727 m).   Weight as of this encounter: 94.3 kg.   Diet: Diet Order  DIET DYS 2 Room service appropriate? Yes; Fluid consistency: Thin  Diet effective now                  Code Status: Full code   Family Communication:   [phone] updated on 01/14/21, message left at 10 AM on 01/15/2021, 01/16/21 updated, updated bedside 01/17/21  Disposition Plan: Status is: Inpatient  Remains inpatient appropriate because:Inpatient level of care appropriate due to severity of illness   Dispo: The patient is from: Home              Anticipated d/c is to: Home              Patient currently is not medically stable to d/c.   Difficult to place patient No   Barriers to Discharge: Ongoing altered mental status-awaiting further work-up-see above  Antimicrobial agents: Anti-infectives (From admission, onward)   Start     Dose/Rate Route Frequency Ordered Stop   01/19/21 2200  doxycycline (VIBRA-TABS) tablet 100 mg        100 mg Oral Every 12 hours 01/19/21 0903 01/22/21 0959   01/17/21 1830  doxycycline (VIBRAMYCIN) 100 mg in sodium chloride 0.9 % 250 mL IVPB  Status:  Discontinued        100 mg 125 mL/hr over 120 Minutes Intravenous Every 12 hours 01/17/21 1730 01/19/21 0903   01/17/21 1830  meropenem (MERREM) 1 g in sodium chloride 0.9 % 100 mL IVPB        1 g 200 mL/hr over 30 Minutes Intravenous Every 8 hours 01/17/21 1757 01/21/21 2359       Time spent:  35 minutes-Greater than 50% of this time was spent in counseling, explanation of diagnosis, planning of further management, and coordination of care.  MEDICATIONS:  Scheduled Meds: .  stroke: mapping our early stages of recovery book   Does not apply Once  . amLODipine  10 mg Oral Daily  . aspirin EC  81 mg Oral Daily  . bisacodyl  10 mg Rectal Daily  . cyanocobalamin  1,000 mcg Subcutaneous Daily  . [START ON 01/20/2021] cyanocobalamin  1,000 mcg Subcutaneous Q30 days  . docusate sodium  200 mg Oral BID  . doxycycline  100 mg Oral Q12H  . enoxaparin (LOVENOX) injection  40 mg Subcutaneous Q24H  . insulin aspart  0-15 Units Subcutaneous TID WC  . isosorbide mononitrate  30 mg Oral Daily  . LORazepam  2 mg Intravenous Once  . metoprolol  tartrate  100 mg Oral BID  . polyethylene glycol  17 g Oral BID  . QUEtiapine  50 mg Oral BID  . simvastatin  40 mg Oral QHS   Continuous Infusions: . meropenem (MERREM) IV 1 g (01/19/21 0135)   PRN Meds:.acetaminophen **OR** [DISCONTINUED] acetaminophen (TYLENOL) oral liquid 160 mg/5 mL **OR** [DISCONTINUED] acetaminophen, haloperidol lactate, hydrALAZINE, metoprolol tartrate, senna-docusate   PHYSICAL EXAM: Vital signs: Vitals:   01/18/21 2207 01/18/21 2321 01/19/21 0334 01/19/21 0805  BP:  134/81 (!) 142/81 (!) 152/88  Pulse: 100 93 (!) 104 (!) 112  Resp:  20 20 19   Temp:  99 F (37.2 C) 98.7 F (37.1 C) 97.7 F (36.5 C)  TempSrc:  Oral Oral Oral  SpO2:  98% 99% 97%  Weight:      Height:       Filed Weights   01/17/21 1749  Weight: 94.3 kg   Body mass index is 31.61 kg/m.   Gen Exam:   Awake, less confused today answering  basic questions, remains extremely weak in both lower extremities, speech is more comprehensible but still dysarthric [place].AT,PERRAL Supple Neck,No JVD, No cervical lymphadenopathy appriciated.  Symmetrical Chest wall movement, Good air movement bilaterally, CTAB RRR,No Gallops, Rubs or new Murmurs, No Parasternal Heave +ve B.Sounds, Abd Soft, No tenderness, No organomegaly appriciated, No rebound - guarding or rigidity. No Cyanosis,   I have personally reviewed following labs and imaging studies  LABORATORY DATA:  Recent Labs  Lab 01/14/21 0327 01/16/21 0254 01/17/21 0505 01/18/21 0606 01/19/21 0254  WBC 9.6 11.2* 12.9* 11.9* 11.7*  HGB 16.5 15.8 16.2 15.0 14.8  HCT 48.7 45.5 46.5 44.2 43.2  PLT 238 229 245 204 234  MCV 90.4 90.1 90.6 90.6 89.1  MCH 30.6 31.3 31.6 30.7 30.5  MCHC 33.9 34.7 34.8 33.9 34.3  RDW 13.1 13.2 13.5 13.4 13.6  LYMPHSABS  --  2.0 1.9 1.0 1.5  MONOABS  --  1.2* 1.4* 1.3* 1.6*  EOSABS  --  0.1 0.0 0.0 0.0  BASOSABS  --  0.1 0.0 0.0 0.0    Recent Labs  Lab 01/12/21 2221 01/14/21 0327 01/15/21 0221  01/16/21 0254 01/17/21 0505 01/17/21 0800 01/18/21 0606 01/19/21 0254 01/19/21 0529 01/19/21 0707  NA  --  138 132* 134* 138  --  135 134*  --   --   K  --  4.1 4.1 4.3 4.4  --  4.2 5.7*  --  4.0  CL  --  104 99 104 106  --  105 105  --   --   CO2  --  20* 19* 17* 17*  --  14* 17*  --   --   GLUCOSE  --  130* 123* 153* 184*  --  164* 170*  --   --   BUN  --  15 22 21 16   --  14 20  --   --   CREATININE  --  1.12 1.28* 1.11 0.98  --  0.96 0.94  --   --   CALCIUM  --  9.8 9.2 9.4 9.7  --  9.5 9.6  --   --   AST  --  21  --  19 16  --  15 26  --   --   ALT  --  15  --  14 12  --  11 8  --   --   ALKPHOS  --  103  --  102 112  --  103 98  --   --   BILITOT  --  2.1*  --  2.2* 2.6*  --  2.7* 2.9*  --   --   ALBUMIN  --  4.3  --  3.8 3.7  --  3.3* 3.2*  --   --   MG  --   --   --  2.2 2.4  --  2.1 2.4  --   --   PROCALCITON  --   --   --   --   --  <0.10 <0.10  --  <0.10  --   TSH 2.095  --   --   --   --   --   --   --   --   --   HGBA1C 7.2*  --   --   --   --   --   --   --   --   --   AMMONIA 11  --   --   --   --   --   --   --   --   --  BNP  --   --   --  33.0 51.0  --  35.2 34.8  --   --       Lab Results  Component Value Date   HGBA1C 7.2 (H) 01/12/2021   Lab Results  Component Value Date   CHOL 118 01/12/2021   HDL 46 01/12/2021   LDLCALC 60 01/12/2021   TRIG 61 01/12/2021   CHOLHDL 2.6 01/12/2021    Coagulation Profile: No results for input(s): INR, PROTIME in the last 168 hours.  Cardiac Enzymes: No results for input(s): CKTOTAL, CKMB, CKMBINDEX, TROPONINI in the last 168 hours.  BNP (last 3 results) No results for input(s): PROBNP in the last 8760 hours.  Lipid Profile: No results for input(s): CHOL, HDL, LDLCALC, TRIG, CHOLHDL, LDLDIRECT in the last 72 hours.  Thyroid Function Tests: No results for input(s): TSH, T4TOTAL, FREET4, T3FREE, THYROIDAB in the last 72 hours.  Anemia Panel: No results for input(s): VITAMINB12, FOLATE, FERRITIN, TIBC, IRON,  RETICCTPCT in the last 72 hours.  Urine analysis:    Component Value Date/Time   COLORURINE AMBER (A) 01/17/2021 1816   APPEARANCEUR CLEAR 01/17/2021 1816   LABSPEC 1.033 (H) 01/17/2021 1816   PHURINE 5.0 01/17/2021 1816   GLUCOSEU >=500 (A) 01/17/2021 1816   HGBUR NEGATIVE 01/17/2021 1816   BILIRUBINUR NEGATIVE 01/17/2021 1816   KETONESUR 80 (A) 01/17/2021 1816   PROTEINUR NEGATIVE 01/17/2021 1816   UROBILINOGEN 0.2 01/06/2014 1257   NITRITE NEGATIVE 01/17/2021 1816   LEUKOCYTESUR NEGATIVE 01/17/2021 1816    Sepsis Labs: Lactic Acid, Venous No results found for: LATICACIDVEN  MICROBIOLOGY: Recent Results (from the past 240 hour(s))  Resp Panel by RT-PCR (Flu A&B, Covid) Nasopharyngeal Swab     Status: None   Collection Time: 01/12/21  4:19 PM   Specimen: Nasopharyngeal Swab; Nasopharyngeal(NP) swabs in vial transport medium  Result Value Ref Range Status   SARS Coronavirus 2 by RT PCR NEGATIVE NEGATIVE Final    Comment: (NOTE) SARS-CoV-2 target nucleic acids are NOT DETECTED.  The SARS-CoV-2 RNA is generally detectable in upper respiratory specimens during the acute phase of infection. The lowest concentration of SARS-CoV-2 viral copies this assay can detect is 138 copies/mL. A negative result does not preclude SARS-Cov-2 infection and should not be used as the sole basis for treatment or other patient management decisions. A negative result may occur with  improper specimen collection/handling, submission of specimen other than nasopharyngeal swab, presence of viral mutation(s) within the areas targeted by this assay, and inadequate number of viral copies(<138 copies/mL). A negative result must be combined with clinical observations, patient history, and epidemiological information. The expected result is Negative.  Fact Sheet for Patients:  EntrepreneurPulse.com.au  Fact Sheet for Healthcare Providers:   IncredibleEmployment.be  This test is no t yet approved or cleared by the [place] FDA and  has been authorized for detection and/or diagnosis of SARS-CoV-2 by FDA under an Emergency Use Authorization (EUA). This EUA will remain  in effect (meaning this test can be used) for the duration of the COVID-19 declaration under Section 564(b)(1) of the Act, 21 U.S.C.section 360bbb-3(b)(1), unless the authorization is terminated  or revoked sooner.       Influenza A by PCR NEGATIVE NEGATIVE Final   Influenza B by PCR NEGATIVE NEGATIVE Final    Comment: (NOTE) The Xpert Xpress SARS-CoV-2/FLU/RSV plus assay is intended as an aid in the diagnosis of influenza from Nasopharyngeal swab specimens and should not be used as a sole basis for  treatment. Nasal washings and aspirates are unacceptable for Xpert Xpress SARS-CoV-2/FLU/RSV testing.  Fact Sheet for Patients: EntrepreneurPulse.com.au  Fact Sheet for Healthcare Providers: IncredibleEmployment.be  This test is not yet approved or cleared by the [place] FDA and has been authorized for detection and/or diagnosis of SARS-CoV-2 by FDA under an Emergency Use Authorization (EUA). This EUA will remain in effect (meaning this test can be used) for the duration of the COVID-19 declaration under Section 564(b)(1) of the Act, 21 U.S.C. section 360bbb-3(b)(1), unless the authorization is terminated or revoked.  Performed at [facility], [place] [address] [place], [place] [address]   Culture, Urine     Status: Abnormal   Collection Time: 01/17/21  6:16 PM   Specimen: Urine, Random  Result Value Ref Range Status   Specimen Description URINE, RANDOM  Final   Special Requests   Final    NONE Performed at [facility], [place] [address], [place], [place] [address]    Culture (A)  Final    >=100,000 COLONIES/mL MULTIPLE SPECIES PRESENT, SUGGEST RECOLLECTION   Report Status  01/19/2021 FINAL  Final  Culture, blood (routine x 2)     Status: None (Preliminary result)   Collection Time: 01/17/21  7:39 PM   Specimen: BLOOD RIGHT HAND  Result Value Ref Range Status   Specimen Description BLOOD RIGHT HAND  Final   Special Requests   Final    BOTTLES DRAWN AEROBIC AND ANAEROBIC Blood Culture adequate volume   Culture   Final    NO GROWTH 2 DAYS Performed at [facility], [place] [address] [place], [place] [address]    Report Status PENDING  Incomplete  Culture, blood (routine x 2)     Status: None (Preliminary result)   Collection Time: 01/17/21  7:42 PM   Specimen: BLOOD  Result Value Ref Range Status   Specimen Description BLOOD RIGHT ANTECUBITAL  Final   Special Requests   Final    BOTTLES DRAWN AEROBIC AND ANAEROBIC Blood Culture adequate volume   Culture   Final    NO GROWTH 2 DAYS Performed at [facility], [place] [address], [place], [place] [address]    Report Status PENDING  Incomplete    RADIOLOGY STUDIES/RESULTS: DG Chest Port 1 View  Result Date: 01/17/2021 CLINICAL DATA:  Short of breath and nausea EXAM: PORTABLE CHEST 1 VIEW COMPARISON:  01/17/2021 FINDINGS: Single frontal view of the chest demonstrates an unremarkable cardiac silhouette. Lung volumes are diminished with crowding of the central vasculature. There is developing consolidation within the medial right lung base which could reflect airspace disease or atelectasis. No effusion or pneumothorax. IMPRESSION: 1. Low lung volumes, with developing consolidation medial right lung base favor atelectasis. 2. Continued central vascular congestion. Electronically Signed   By: Randa Ngo M.D.   On: 01/17/2021 17:50   DG Abd Portable 1V  Result Date: 01/17/2021 CLINICAL DATA:  Short of breath, nausea EXAM: PORTABLE ABDOMEN - 1 VIEW COMPARISON:  01/06/2014 FINDINGS: Supine frontal view of the abdomen and pelvis was performed, excluding the left flank and upper abdomen by collimation.  Bowel gas pattern is unremarkable without obstruction or ileus. No masses or abnormal calcifications. Moderate right hip osteoarthritis with joint space narrowing, bony remodeling of the femoral head, and marked marginal osteophyte formation. This has progressed significantly since prior study. Stable mild left hip osteoarthritis. IMPRESSION: 1. Unremarkable bowel gas pattern. 2. Moderate to severe progressive right hip osteoarthritis. Electronically Signed  By: Randa Ngo M.D.   On: 01/17/2021 17:49     LOS: 7 days   Lala Lund, MD  [facility]  01/19/2021, 10:24 AM

## 2021-01-20 ENCOUNTER — Inpatient Hospital Stay (HOSPITAL_COMMUNITY): Payer: HMO

## 2021-01-20 DIAGNOSIS — I10 Essential (primary) hypertension: Secondary | ICD-10-CM | POA: Diagnosis not present

## 2021-01-20 DIAGNOSIS — I6300 Cerebral infarction due to thrombosis of unspecified precerebral artery: Secondary | ICD-10-CM | POA: Diagnosis not present

## 2021-01-20 LAB — COMPREHENSIVE METABOLIC PANEL
ALT: 14 U/L (ref 0–44)
AST: 31 U/L (ref 15–41)
Albumin: 2.9 g/dL — ABNORMAL LOW (ref 3.5–5.0)
Alkaline Phosphatase: 95 U/L (ref 38–126)
Anion gap: 8 (ref 5–15)
BUN: 18 mg/dL (ref 8–23)
CO2: 18 mmol/L — ABNORMAL LOW (ref 22–32)
Calcium: 9.5 mg/dL (ref 8.9–10.3)
Chloride: 105 mmol/L (ref 98–111)
Creatinine, Ser: 0.82 mg/dL (ref 0.61–1.24)
GFR, Estimated: >60 mL/min (ref >60)
Glucose, Bld: 176 mg/dL — ABNORMAL HIGH (ref 70–99)
Potassium: 4 mmol/L (ref 3.5–5.1)
Sodium: 131 mmol/L — ABNORMAL LOW (ref 135–145)
Total Bilirubin: 1.7 mg/dL — ABNORMAL HIGH (ref 0.3–1.2)
Total Protein: 6.4 g/dL — ABNORMAL LOW (ref 6.5–8.1)

## 2021-01-20 LAB — CBC WITH DIFFERENTIAL/PLATELET
Abs Immature Granulocytes: 0.03 10*3/uL (ref 0.00–0.07)
Basophils Absolute: 0 10*3/uL (ref 0.0–0.1)
Basophils Relative: 0 %
Eosinophils Absolute: 0 10*3/uL (ref 0.0–0.5)
Eosinophils Relative: 0 %
HCT: 42.1 % (ref 39.0–52.0)
Hemoglobin: 14.7 g/dL (ref 13.0–17.0)
Immature Granulocytes: 0 %
Lymphocytes Relative: 13 %
Lymphs Abs: 1.5 10*3/uL (ref 0.7–4.0)
MCH: 30.9 pg (ref 26.0–34.0)
MCHC: 34.9 g/dL (ref 30.0–36.0)
MCV: 88.6 fL (ref 80.0–100.0)
Monocytes Absolute: 1.4 10*3/uL — ABNORMAL HIGH (ref 0.1–1.0)
Monocytes Relative: 12 %
Neutro Abs: 8.9 10*3/uL — ABNORMAL HIGH (ref 1.7–7.7)
Neutrophils Relative %: 75 %
Platelets: 219 10*3/uL (ref 150–400)
RBC: 4.75 MIL/uL (ref 4.22–5.81)
RDW: 13.3 % (ref 11.5–15.5)
WBC: 11.9 10*3/uL — ABNORMAL HIGH (ref 4.0–10.5)
nRBC: 0 % (ref 0.0–0.2)

## 2021-01-20 LAB — BRAIN NATRIURETIC PEPTIDE: B Natriuretic Peptide: 27.6 pg/mL (ref 0.0–100.0)

## 2021-01-20 LAB — GLUCOSE, CAPILLARY
Glucose-Capillary: 167 mg/dL — ABNORMAL HIGH (ref 70–99)
Glucose-Capillary: 168 mg/dL — ABNORMAL HIGH (ref 70–99)
Glucose-Capillary: 139 mg/dL — ABNORMAL HIGH (ref 70–99)
Glucose-Capillary: 199 mg/dL — ABNORMAL HIGH (ref 70–99)

## 2021-01-20 LAB — PROCALCITONIN: Procalcitonin: <0.1 ng/mL

## 2021-01-20 LAB — MAGNESIUM: Magnesium: 2.1 mg/dL (ref 1.7–2.4)

## 2021-01-20 IMAGING — DX DG CHEST 1V PORT
1 series · 1 of 1 positions shown · non-contrast
Comparison: [DATE]

CLINICAL DATA: Dyspnea, chest pain

EXAM:
PORTABLE CHEST 1 VIEW

[chest]
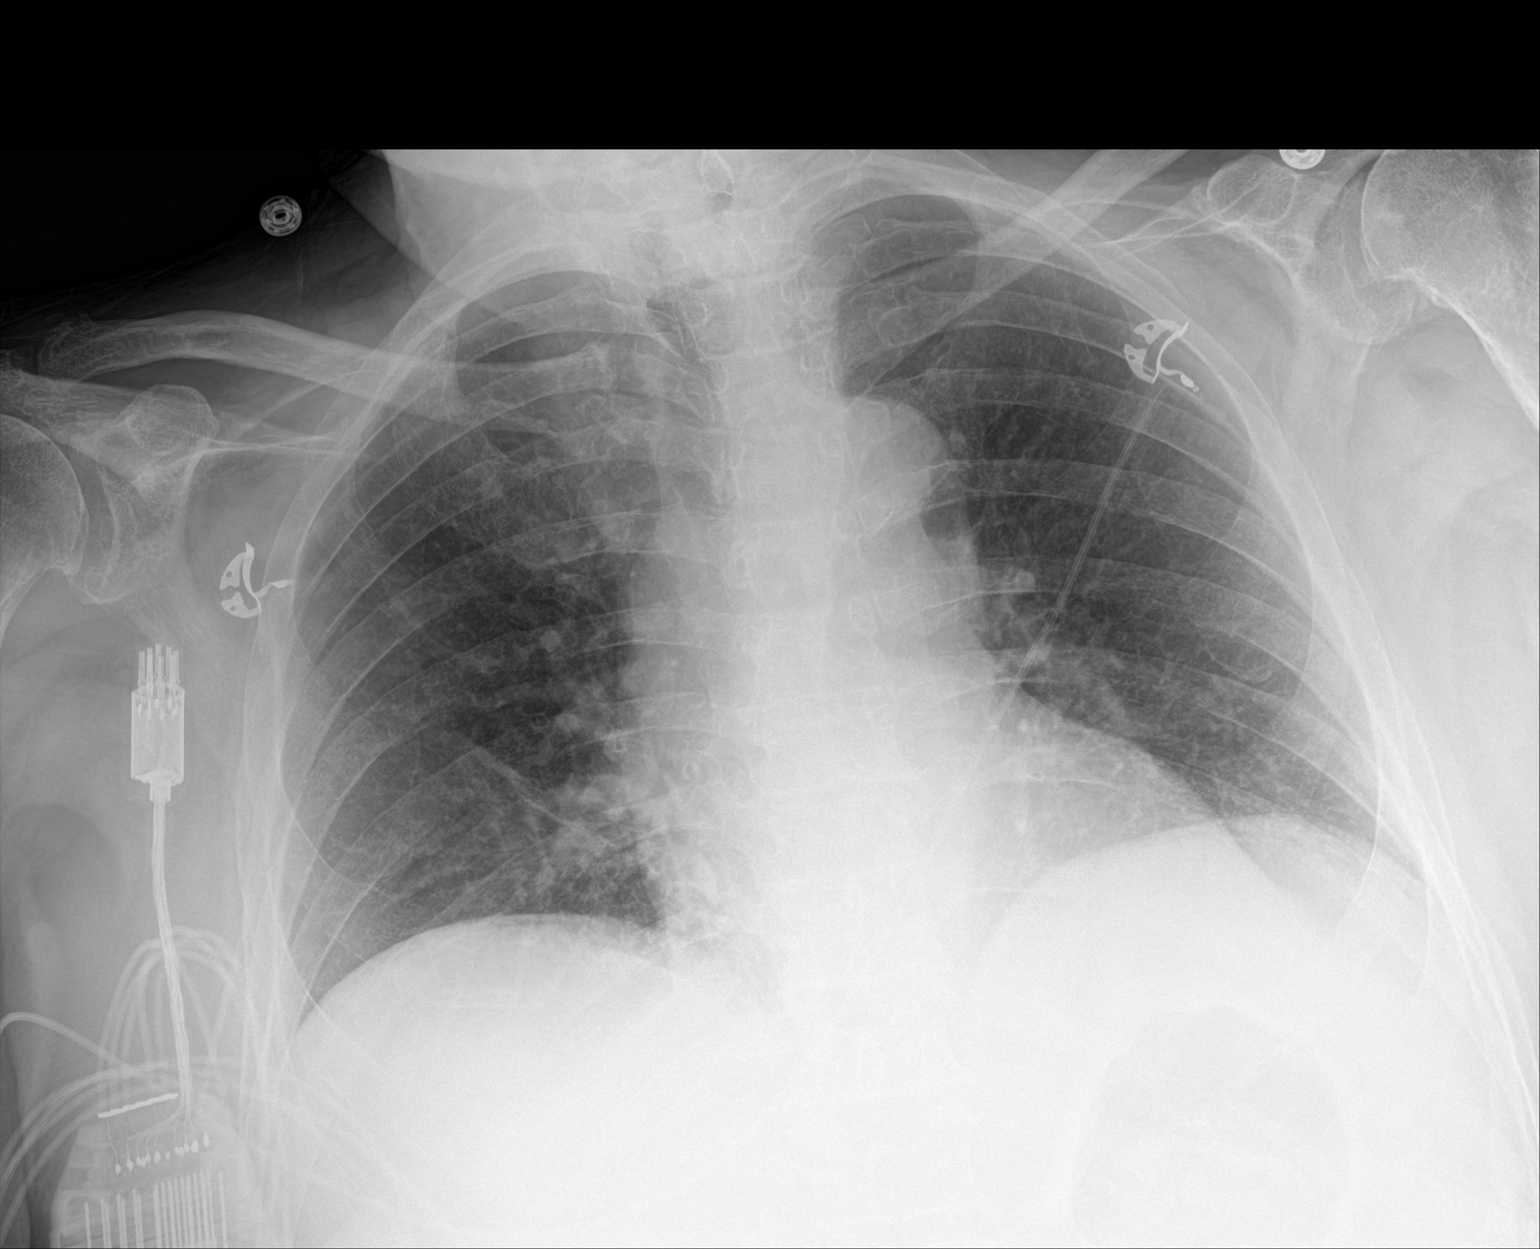

[1 of 1 positions shown; findings below may reference images not displayed]

FINDINGS: Lungs volumes are small, but are symmetric and are clear. No
pneumothorax or pleural effusion. Cardiac size within normal limits.
Pulmonary vascularity is normal. Osseous structures are
age-appropriate. No acute bone abnormality.
IMPRESSION: No active disease.

## 2021-01-20 MED ORDER — LACTATED RINGERS IV SOLN: INTRAVENOUS | Status: AC

## 2021-01-20 NOTE — Progress Notes (Signed)
Physical Therapy Treatment Patient Details Name: Vincent [name] MRN: [id number] DOB: 04-Apr-1951 Today's Date: 01/20/2021    History of Present Illness 70 y/o male presented to ED at [facility] on 4/7 after being sent to [facility] for emergent MRI but unable to get to car. 2 weeks prior, patient had syncopal episode at PCP and has had 16 falls since. Patient presents with slurred speech, weakness, and confusion for one month. Unable to obtain MRI secondary to agitation. CT head and c-spine show chronic microvascular disease and severe C6 degenerative changes without acute process. PMH: anxiety, CAD, depression, DM type 2, HLD, HTN    PT Comments    Agitated and inappropriate this session. Patient disoriented to place, time, and situation and not easily oriented. Constant redirecting throughout due to inappropriate behavior. Patient with little initiation for bed mobility and required totalA+2 for supine<>sit. Upon sitting EOB, required min-modA to maintain and requested to return to supine. Deferred further mobility due to agitation. Continue to recommend SNF for ongoing Physical Therapy.       Follow Up Recommendations  SNF;Supervision/Assistance - 24 hour     Equipment Recommendations  None recommended by PT    Recommendations for Other Services       Precautions / Restrictions Precautions Precautions: Fall Precaution Comments: wrist restraints Restrictions Weight Bearing Restrictions: No    Mobility  Bed Mobility Overal bed mobility: Needs Assistance Bed Mobility: Supine to Sit;Sit to Supine Rolling: Max assist   Supine to sit: Total assist;+2 for safety/equipment;+2 for physical assistance Sit to supine: Total assist;+2 for physical assistance;+2 for safety/equipment   General bed mobility comments: maxA to roll for pad readjustment. TotalA+2 for bed mobility as patient with no initiation due to agitation    Transfers                 General transfer comment:  deferred due to agitation  Ambulation/Gait                 Stairs             Wheelchair Mobility    Modified Rankin (Stroke Patients Only) Modified Rankin (Stroke Patients Only) Pre-Morbid Rankin Score: Moderate disability Modified Rankin: Severe disability     Balance Overall balance assessment: Needs assistance Sitting-balance support: Bilateral upper extremity supported;Feet supported Sitting balance-Leahy Scale: Poor Sitting balance - Comments: min-modA to maintain sitting balance this session with posterior lean Postural control: Posterior lean                                  Cognition Arousal/Alertness: Awake/alert Behavior During Therapy: Agitated Overall Cognitive Status: No family/caregiver present to determine baseline cognitive functioning Area of Impairment: Attention;Memory;Following commands;Safety/judgement;Awareness;Problem solving;Orientation                 Orientation Level: Disoriented to;Place;Time;Situation Current Attention Level: Focused Memory: Decreased recall of precautions;Decreased short-term memory Following Commands: Follows one step commands inconsistently;Follows one step commands with increased time Safety/Judgement: Decreased awareness of safety;Decreased awareness of deficits Awareness: Intellectual Problem Solving: Decreased initiation;Difficulty sequencing;Requires verbal cues;Requires tactile cues;Slow processing General Comments: Extremely agitated throughout session with inappropriate comments and gestures. Constant redirecting. Decreased initiation 2/2 agitation. Hallucinating and disoriented to time, place, and situation. Not easily reoriented      Exercises      General Comments        Pertinent Vitals/Pain Pain Assessment: Faces Faces Pain Scale: Hurts even more Pain  Location: R LE with movement Pain Descriptors / Indicators: Discomfort;Grimacing;Guarding Pain Intervention(s):  Monitored during session;Repositioned    Home Living                      Prior Function            PT Goals (current goals can now be found in the care plan section) Acute Rehab PT Goals Patient Stated Goal: none stated PT Goal Formulation: With patient/family Time For Goal Achievement: 01/28/21 Potential to Achieve Goals: Fair Progress towards PT goals: Not progressing toward goals - comment    Frequency    Min 3X/week      PT Plan Current plan remains appropriate    Co-evaluation              AM-PAC PT "6 Clicks" Mobility   Outcome Measure  Help needed turning from your back to your side while in a flat bed without using bedrails?: A Lot Help needed moving from lying on your back to sitting on the side of a flat bed without using bedrails?: Total Help needed moving to and from a bed to a chair (including a wheelchair)?: Total Help needed standing up from a chair using your arms (e.g., wheelchair or bedside chair)?: Total Help needed to walk in hospital room?: Total Help needed climbing 3-5 steps with a railing? : Total 6 Click Score: 7    End of Session   Activity Tolerance: Treatment limited secondary to agitation Patient left: in bed;with call bell/phone within reach;with bed alarm set;with restraints reapplied Nurse Communication: Mobility status PT Visit Diagnosis: Unsteadiness on feet (R26.81);Muscle weakness (generalized) (M62.81);Repeated falls (R29.6);Other abnormalities of gait and mobility (R26.89)     Time: 5956-3875 PT Time Calculation (min) (ACUTE ONLY): 16 min  Charges:  $Therapeutic Activity: 8-22 mins                     Brittanyann Wittner A. Gilford Rile PT, DPT Acute Rehabilitation Services Pager [phone] Office [phone]    Linna Hoff 01/20/2021, 5:01 PM

## 2021-01-20 NOTE — Progress Notes (Signed)
PROGRESS NOTE        PATIENT DETAILS Name: Vincent [name] Age: 70 y.o. Sex: male Date of Birth: 1950/10/23 Admit Date: 01/12/2021 Admitting Physician Lequita Halt, MD XLK:GMWN, Emily Filbert., MD  Brief Narrative: Patient is a 70 y.o. male with history of insulin-dependent DM-2, HTN, CAD, chronic systolic heart failure, chronic pain syndrome on narcotics/benzos-presented with a 2-week history of worsening confusion, visual hallucinations and frequent falls.  Spouse reports slurred speech for a few days as well.  Patient was subsequently admitted to the hospitalist service for further evaluation and treatment.  Significant events: 4/7>> admit for evaluation of slurred speech, confusion, hallucination, frequent falls  Significant studies: 4/7>> CT head: Negative for acute abnormalities 4/7>> CT C-spine: No fracture 4/7>> x-ray right knee: Right knee replacement without hardware loosening or fracture 4/8>> Echo: EF 45-50%, apical akinesis, grade 1 diastolic dysfunction.  Swirling of contrast at apex consistent with low flow but no thrombus, +ve WMA. 4/8>> vitamin B12: 72  Antimicrobial therapy: None  Microbiology data: None  Procedures : None  Consults: Neurology  DVT Prophylaxis : enoxaparin (LOVENOX) injection 40 mg Start: 01/13/21 1000   Subjective: Patient in bed, appears comfortable, denies any headache, no fever, no chest pain or pressure, no shortness of breath , no abdominal pain. No new focal weakness.   Assessment/Plan:  Encephalopathy/slurred speech/visual hallucinations: Probable polypharmacy-but need to rule out CVA due to slurred speech-echo findings (smoke seen)-vitamin B12 deficiency may be contributing as well-continue to hold benzos/narcotics-increased Seroquel to 50 mg twice daily-use Haldol as needed.  Clinically improving, MRI brain noted and DW Neuro 01/15/21, neurological changes likely due to prolonged B12 deficiency, according  to the wife symptoms have been gradually progressive over several months. Neurology following, SNF vs CIR.   Incidental small right parietal stroke noted.  He is on aspirin and statin, no deficits contributed to this, continue to monitor.  May require placement.  LDL under 70, A1c mildly elevated.  Vitamin B12 deficiency: On B12 supplementation-recheck vitamin B12 levels in 3 months.  Mild leukocytosis with low-grade fevers and tachycardia due to aspiration pneumonia in the hospital.   Chest x-ray positive sided infiltrate, clinically also suspicious aspiration pneumonia, placed on appropriate antibiotics on 01/17/21 based on his allergy profile.  Continue soft diet with feeding assistance and aspiration precautions, speech following.  Combined chronic systolic and diastolic heart failure: Euvolemic-since slightly dehydrated on 01/15/2021 hold further diuretics and hydrate.  CAD-s/p PCI to LAD in 2007: No anginal symptoms-has EF 45% with apical akinesis on echo-most recent nuclear stress test in 2017 showed a fixed defect in the apex stable for outpatient follow-up with primary cardiologist.  Remains on aspirin. Will need outpt Cards follow up in 7-10 days post DC.  HTN: Blood pressure controlled-continue present regimen.  Holding ACE inhibitor due to AKI.  HLD: Continue statin-LDL 60.  Mild clinical dehydration with hyponatremia and AKI.  Hydrate with IV fluids and monitor.    DM-2 (A1c 7.2 on 4/7): CBG stable with SSI-hold all oral hypoglycemic agents  Recent Labs    01/19/21 1947 01/19/21 2334 01/20/21 0401  GLUCAP 177* 159* 167*    Obesity: Estimated body mass index is 31.61 kg/m as calculated from the following:   Height as of this encounter: 5\' 8"  (1.727 m).   Weight as of this encounter: 94.3 kg.   Diet: Diet  Order            DIET DYS 2 Room service appropriate? Yes; Fluid consistency: Thin  Diet effective now                  Code Status: Full code   Family  Communication:  [phone] updated on 01/14/21, message left at 10 AM on 01/15/2021, 01/16/21 updated, updated bedside 01/17/21  Disposition Plan: Status is: Inpatient  Remains inpatient appropriate because:Inpatient level of care appropriate due to severity of illness   Dispo: The patient is from: Home              Anticipated d/c is to: Home              Patient currently is not medically stable to d/c.   Difficult to place patient No   Barriers to Discharge: Ongoing altered mental status-awaiting further work-up-see above  Antimicrobial agents: Anti-infectives (From admission, onward)   Start     Dose/Rate Route Frequency Ordered Stop   01/19/21 2200  doxycycline (VIBRA-TABS) tablet 100 mg        100 mg Oral Every 12 hours 01/19/21 0903 01/22/21 0759   01/17/21 1830  doxycycline (VIBRAMYCIN) 100 mg in sodium chloride 0.9 % 250 mL IVPB  Status:  Discontinued        100 mg 125 mL/hr over 120 Minutes Intravenous Every 12 hours 01/17/21 1730 01/19/21 0903   01/17/21 1830  meropenem (MERREM) 1 g in sodium chloride 0.9 % 100 mL IVPB        1 g 200 mL/hr over 30 Minutes Intravenous Every 8 hours 01/17/21 1757 01/21/21 2359       Time spent:  35 minutes-Greater than 50% of this time was spent in counseling, explanation of diagnosis, planning of further management, and coordination of care.  MEDICATIONS:  Scheduled Meds: .  stroke: mapping our early stages of recovery book   Does not apply Once  . amLODipine  10 mg Oral Daily  . aspirin EC  81 mg Oral Daily  . bisacodyl  10 mg Rectal Daily  . cyanocobalamin  1,000 mcg Subcutaneous Q30 days  . docusate sodium  200 mg Oral BID  . doxycycline  100 mg Oral Q12H  . enoxaparin (LOVENOX) injection  40 mg Subcutaneous Q24H  . insulin aspart  0-15 Units Subcutaneous TID WC  . isosorbide mononitrate  30 mg Oral Daily  . metoprolol tartrate  100 mg Oral BID  . multivitamin with minerals  1 tablet Oral Daily  . polyethylene  glycol  17 g Oral BID  . QUEtiapine  50 mg Oral BID  . simvastatin  40 mg Oral QHS  . thiamine  100 mg Oral Daily   Continuous Infusions: . lactated ringers    . meropenem (MERREM) IV 1 g (01/20/21 0200)   PRN Meds:.acetaminophen **OR** [DISCONTINUED] acetaminophen (TYLENOL) oral liquid 160 mg/5 mL **OR** [DISCONTINUED] acetaminophen, haloperidol lactate, hydrALAZINE, metoprolol tartrate, senna-docusate   PHYSICAL EXAM: Vital signs: Vitals:   01/20/21 0359 01/20/21 0818 01/20/21 0957 01/20/21 1139  BP: (!) 154/86 (!) 151/104 (!) 151/83 130/82  Pulse: (!) 106 (!) 116 92 (!) 103  Resp: 20 20  20   Temp: (!) 97.3 F (36.3 C) 98.1 F (36.7 C)  99.5 F (37.5 C)  TempSrc: Oral Oral  Oral  SpO2: 97% 98%  96%  Weight:      Height:       Filed Weights   01/17/21 1749  Weight: 94.3  kg   Body mass index is 31.61 kg/m.   Gen Exam:   Awake, less confused today answering basic questions, remains extremely weak in both lower extremities, speech is more comprehensible but still dysarthric [place].AT,PERRAL Supple Neck,No JVD, No cervical lymphadenopathy appriciated.  Symmetrical Chest wall movement, Good air movement bilaterally, CTAB RRR,No Gallops, Rubs or new Murmurs, No Parasternal Heave +ve B.Sounds, Abd Soft, No tenderness, No organomegaly appriciated, No rebound - guarding or rigidity. No Cyanosis,     I have personally reviewed following labs and imaging studies  LABORATORY DATA:  Recent Labs  Lab 01/16/21 0254 01/17/21 0505 01/18/21 0606 01/19/21 0254 01/20/21 0239  WBC 11.2* 12.9* 11.9* 11.7* 11.9*  HGB 15.8 16.2 15.0 14.8 14.7  HCT 45.5 46.5 44.2 43.2 42.1  PLT 229 245 204 234 219  MCV 90.1 90.6 90.6 89.1 88.6  MCH 31.3 31.6 30.7 30.5 30.9  MCHC 34.7 34.8 33.9 34.3 34.9  RDW 13.2 13.5 13.4 13.6 13.3  LYMPHSABS 2.0 1.9 1.0 1.5 1.5  MONOABS 1.2* 1.4* 1.3* 1.6* 1.4*  EOSABS 0.1 0.0 0.0 0.0 0.0  BASOSABS 0.1 0.0 0.0 0.0 0.0    Recent Labs  Lab 01/16/21 0254  01/17/21 0505 01/17/21 0800 01/18/21 0606 01/19/21 0254 01/19/21 0529 01/19/21 0707 01/20/21 0239  NA 134* 138  --  135 134*  --   --  131*  K 4.3 4.4  --  4.2 5.7*  --  4.0 4.0  CL 104 106  --  105 105  --   --  105  CO2 17* 17*  --  14* 17*  --   --  18*  GLUCOSE 153* 184*  --  164* 170*  --   --  176*  BUN 21 16  --  14 20  --   --  18  CREATININE 1.11 0.98  --  0.96 0.94  --   --  0.82  CALCIUM 9.4 9.7  --  9.5 9.6  --   --  9.5  AST 19 16  --  15 26  --   --  31  ALT 14 12  --  11 8  --   --  14  ALKPHOS 102 112  --  103 98  --   --  95  BILITOT 2.2* 2.6*  --  2.7* 2.9*  --   --  1.7*  ALBUMIN 3.8 3.7  --  3.3* 3.2*  --   --  2.9*  MG 2.2 2.4  --  2.1 2.4  --   --  2.1  PROCALCITON  --   --  <0.10 <0.10  --  <0.10  --  <0.10  BNP 33.0 51.0  --  35.2 34.8  --   --  27.6   Lab Results  Component Value Date   TSH 2.095 01/12/2021    Lab Results  Component Value Date   HGBA1C 7.2 (H) 01/12/2021   Lab Results  Component Value Date   CHOL 118 01/12/2021   HDL 46 01/12/2021   LDLCALC 60 01/12/2021   TRIG 61 01/12/2021   CHOLHDL 2.6 01/12/2021    Coagulation Profile: No results for input(s): INR, PROTIME in the last 168 hours.  Cardiac Enzymes: No results for input(s): CKTOTAL, CKMB, CKMBINDEX, TROPONINI in the last 168 hours.  BNP (last 3 results) No results for input(s): PROBNP in the last 8760 hours.  Lipid Profile: No results for input(s): CHOL, HDL, LDLCALC, TRIG, CHOLHDL, LDLDIRECT in the last 72  hours.  Thyroid Function Tests: No results for input(s): TSH, T4TOTAL, FREET4, T3FREE, THYROIDAB in the last 72 hours.  Anemia Panel: No results for input(s): VITAMINB12, FOLATE, FERRITIN, TIBC, IRON, RETICCTPCT in the last 72 hours.  Urine analysis:    Component Value Date/Time   COLORURINE AMBER (A) 01/17/2021 1816   APPEARANCEUR CLEAR 01/17/2021 1816   LABSPEC 1.033 (H) 01/17/2021 1816   PHURINE 5.0 01/17/2021 1816   GLUCOSEU >=500 (A) 01/17/2021 1816    HGBUR NEGATIVE 01/17/2021 1816   BILIRUBINUR NEGATIVE 01/17/2021 1816   KETONESUR 80 (A) 01/17/2021 1816   PROTEINUR NEGATIVE 01/17/2021 1816   UROBILINOGEN 0.2 01/06/2014 1257   NITRITE NEGATIVE 01/17/2021 1816   LEUKOCYTESUR NEGATIVE 01/17/2021 1816    Sepsis Labs: Lactic Acid, Venous No results found for: LATICACIDVEN  MICROBIOLOGY: Recent Results (from the past 240 hour(s))  Resp Panel by RT-PCR (Flu A&B, Covid) Nasopharyngeal Swab     Status: None   Collection Time: 01/12/21  4:19 PM   Specimen: Nasopharyngeal Swab; Nasopharyngeal(NP) swabs in vial transport medium  Result Value Ref Range Status   SARS Coronavirus 2 by RT PCR NEGATIVE NEGATIVE Final    Comment: (NOTE) SARS-CoV-2 target nucleic acids are NOT DETECTED.  The SARS-CoV-2 RNA is generally detectable in upper respiratory specimens during the acute phase of infection. The lowest concentration of SARS-CoV-2 viral copies this assay can detect is 138 copies/mL. A negative result does not preclude SARS-Cov-2 infection and should not be used as the sole basis for treatment or other patient management decisions. A negative result may occur with  improper specimen collection/handling, submission of specimen other than nasopharyngeal swab, presence of viral mutation(s) within the areas targeted by this assay, and inadequate number of viral copies(<138 copies/mL). A negative result must be combined with clinical observations, patient history, and epidemiological information. The expected result is Negative.  Fact Sheet for Patients:  EntrepreneurPulse.com.au  Fact Sheet for Healthcare Providers:  IncredibleEmployment.be  This test is no t yet approved or cleared by the [place] FDA and  has been authorized for detection and/or diagnosis of SARS-CoV-2 by FDA under an Emergency Use Authorization (EUA). This EUA will remain  in effect (meaning this test can be used) for the  duration of the COVID-19 declaration under Section 564(b)(1) of the Act, 21 U.S.C.section 360bbb-3(b)(1), unless the authorization is terminated  or revoked sooner.       Influenza A by PCR NEGATIVE NEGATIVE Final   Influenza B by PCR NEGATIVE NEGATIVE Final    Comment: (NOTE) The Xpert Xpress SARS-CoV-2/FLU/RSV plus assay is intended as an aid in the diagnosis of influenza from Nasopharyngeal swab specimens and should not be used as a sole basis for treatment. Nasal washings and aspirates are unacceptable for Xpert Xpress SARS-CoV-2/FLU/RSV testing.  Fact Sheet for Patients: EntrepreneurPulse.com.au  Fact Sheet for Healthcare Providers: IncredibleEmployment.be  This test is not yet approved or cleared by the [place] FDA and has been authorized for detection and/or diagnosis of SARS-CoV-2 by FDA under an Emergency Use Authorization (EUA). This EUA will remain in effect (meaning this test can be used) for the duration of the COVID-19 declaration under Section 564(b)(1) of the Act, 21 U.S.C. section 360bbb-3(b)(1), unless the authorization is terminated or revoked.  Performed at [facility], [place] [address], [place], [place] [address]   Culture, Urine     Status: Abnormal   Collection Time: 01/17/21  6:16 PM   Specimen: Urine, Random  Result Value Ref Range Status  Specimen Description URINE, RANDOM  Final   Special Requests   Final    NONE Performed at [facility], [place] [address] [place], [place] [address]    Culture (A)  Final    >=100,000 COLONIES/mL MULTIPLE SPECIES PRESENT, SUGGEST RECOLLECTION   Report Status 01/19/2021 FINAL  Final  Culture, blood (routine x 2)     Status: None (Preliminary result)   Collection Time: 01/17/21  7:39 PM   Specimen: BLOOD RIGHT HAND  Result Value Ref Range Status   Specimen Description BLOOD RIGHT HAND  Final   Special Requests   Final    BOTTLES DRAWN AEROBIC AND ANAEROBIC  Blood Culture adequate volume   Culture   Final    NO GROWTH 3 DAYS Performed at [facility], [place] [address] [place], [place] [address]    Report Status PENDING  Incomplete  Culture, blood (routine x 2)     Status: None (Preliminary result)   Collection Time: 01/17/21  7:42 PM   Specimen: BLOOD  Result Value Ref Range Status   Specimen Description BLOOD RIGHT ANTECUBITAL  Final   Special Requests   Final    BOTTLES DRAWN AEROBIC AND ANAEROBIC Blood Culture adequate volume   Culture   Final    NO GROWTH 3 DAYS Performed at [facility], [place] [address], [place], [place] [address]    Report Status PENDING  Incomplete    RADIOLOGY STUDIES/RESULTS: DG Chest Port 1 View  Result Date: 01/20/2021 CLINICAL DATA:  Dyspnea, chest pain EXAM: PORTABLE CHEST 1 VIEW COMPARISON:  01/17/2021 FINDINGS: Lungs volumes are small, but are symmetric and are clear. No pneumothorax or pleural effusion. Cardiac size within normal limits. Pulmonary vascularity is normal. Osseous structures are age-appropriate. No acute bone abnormality. IMPRESSION: No active disease. Electronically Signed   By: Fidela Salisbury MD   On: 01/20/2021 08:09     LOS: 8 days   Lala Lund, MD  [facility]  01/20/2021, 11:42 AM

## 2021-01-20 NOTE — Progress Notes (Signed)
Pt refused all HS PO meds d/t agitation. Dr. Marlowe Sax informed.

## 2021-01-20 NOTE — Plan of Care (Signed)
  Problem: Coping: Goal: Will verbalize positive feelings about self Outcome: Progressing Goal: Will identify appropriate support needs Outcome: Progressing   Problem: Safety: Goal: Non-violent Restraint(s) Outcome: Progressing

## 2021-01-20 NOTE — Consult Note (Signed)
Physical Medicine and Rehabilitation Consult Reason for Consult: Syncope with persistent weakness slurred speech and multiple falls Referring Physician: Dr. Candiss Norse   HPI: Vincent [name] is a 70 y.o. right-handed male with history of CAD status post LAD stent May 2007, ischemic cardiomyopathy ejection fraction of 40 to 50%, hypertension, hyperlipidemia, diabetes mellitus, chronic pain on chronic opiates, right ankle abscess surgery 1986 and right knee arthroplasty 2017 and remote tobacco abuse.  Presented 01/12/2021 with slurred speech persistent weakness and confusion x1 month with persistent falls.  Cranial CT scan negative.  CT cervical spine no acute fracture or subluxation.  MRI of the brain showed a small subacute infarct in the right parietal white matter likely a current manifestation of patient's extensive chronic small vessel ischemia.  Echocardiogram with ejection fraction of 45 to 36% grade 1 diastolic dysfunction.  Left ventricle demonstrated regional wall motion abnormality with apical akinesis.  Admission chemistries unremarkable except glucose 125, ammonia level of 11, hemoglobin 10.6, urine drug screen positive benzos as well as marijuana.  Findings of B12 deficiency placed on supplementation.  Currently maintained on aspirin for CVA prophylaxis.  Subcutaneous Lovenox for DVT prophylaxis.  Intravenous Merrem was initiated for HCAP.  He is on a dysphagia #2 thin liquid diet.  Therapy evaluations completed due to patient's decreased functional mobility recommendations of physical medicine rehab consult. He is currently confused and agitated.    Review of Systems  Constitutional: Negative for chills and fever.  HENT: Positive for hearing loss.   Eyes: Negative for blurred vision and double vision.  Respiratory: Negative for cough and shortness of breath.   Cardiovascular: Positive for leg swelling. Negative for chest pain and palpitations.  Gastrointestinal: Positive for  constipation. Negative for heartburn, nausea and vomiting.  Genitourinary: Negative for dysuria, flank pain and hematuria.  Musculoskeletal: Positive for falls, joint pain and myalgias.  Skin: Negative for rash.  Neurological: Negative for seizures.  Psychiatric/Behavioral: Positive for depression. The patient has insomnia.        Anxiety  All other systems reviewed and are negative.  Past Medical History:  Diagnosis Date  . Acute MI ([place])   . Allergy   . Anxiety   . Arthritis   . Coronary artery disease   . Depression   . Diabetes mellitus without complication ([place])   . Diverticulosis    mild  . Hemorrhoids   . HOH (hard of hearing)   . Hx of colonic polyps   . Hyperlipidemia   . Hypertension   . Left ventricular dysfunction    hx of   Past Surgical History:  Procedure Laterality Date  . ANGIOPLASTY     and bare metal stent placement  . ANKLE SURGERY Right 1986   i&d abscess- pt states "put a bone in it"  . CORONARY ANGIOPLASTY WITH STENT PLACEMENT    . KNEE ARTHROPLASTY Right 08/09/2016   Procedure: RIGHT TOTAL KNEE ARTHROPLASTY WITH COMPUTER NAVIGATION;  Surgeon: Rod Can, MD;  Location: [facility];  Service: Orthopedics;  Laterality: Right;  Needs RNFA  . LEFT HEART CATH AND CORONARY ANGIOGRAPHY N/A 11/21/2018   Procedure: LEFT HEART CATH AND CORONARY ANGIOGRAPHY;  Surgeon: Burnell Blanks, MD;  Location: [facility];  Service: Cardiovascular;  Laterality: N/A;   Family History  Problem Relation Age of Onset  . Coronary artery disease Other        positive family hx of  . Colon cancer Neg Hx   . Esophageal cancer Neg  Hx   . Liver cancer Neg Hx   . Pancreatic cancer Neg Hx   . Rectal cancer Neg Hx   . Stomach cancer Neg Hx    Social History:  reports that he quit smoking about 15 years ago. His smoking use included cigars. He quit smokeless tobacco use about 15 years ago. He reports that he does not drink alcohol and does not use drugs. Allergies:   Allergies  Allergen Reactions  . Penicillins Other (See Comments)    Disputed in 2022 Has patient had a PCN reaction causing immediate rash, facial/tongue/throat swelling, SOB or lightheadedness with hypotension: Unknown Has patient had a PCN reaction causing severe rash involving mucus membranes or skin necrosis: Unknown Has patient had a PCN reaction that required hospitalization: Unknown Has patient had a PCN reaction occurring within the last 10 years: No If all of the above answers are "NO", then may proceed with Cephalosporin use.    Facility-Administered Medications Prior to Admission  Medication Dose Route Frequency Provider Last Rate Last Admin  . 0.9 %  sodium chloride infusion  500 mL Intravenous Once Ladene Artist, MD       Medications Prior to Admission  Medication Sig Dispense Refill  . aspirin EC 81 MG tablet Take 1 tablet (81 mg total) by mouth daily. (Patient taking differently: Take 81 mg by mouth at bedtime.) 90 tablet 3  . cyclobenzaprine (FLEXERIL) 10 MG tablet Take 10 mg by mouth in the morning and at bedtime.    . furosemide (LASIX) 40 MG tablet TAKE 1 TABLET BY MOUTH TWICE A DAY (Patient taking differently: Take 40 mg by mouth 2 (two) times daily.) 180 tablet 3  . HYDROcodone-acetaminophen (NORCO/VICODIN) 5-325 MG tablet Take 1 tablet by mouth every 8 (eight) hours as needed for moderate pain.   0  . isosorbide mononitrate (IMDUR) 30 MG 24 hr tablet Take 1 tablet (30 mg total) by mouth daily. 90 tablet 3  . LORazepam (ATIVAN) 1 MG tablet Take 1 mg by mouth every 8 (eight) hours.  3  . meloxicam (MOBIC) 7.5 MG tablet Take 1 tablet (7.5 mg total) by mouth daily. (Patient taking differently: Take 7.5 mg by mouth at bedtime.) 7 tablet 0  . metFORMIN (GLUCOPHAGE) 1000 MG tablet Take 1,000 mg by mouth 2 (two) times daily.    . metoprolol tartrate (LOPRESSOR) 50 MG tablet TAKE 1 TABLET BY MOUTH TWICE A DAY (Patient taking differently: Take 50 mg by mouth in the morning  and at bedtime.) 180 tablet 3  . nitroGLYCERIN (NITROSTAT) 0.4 MG SL tablet Place 1 tablet (0.4 mg total) under the tongue every 5 (five) minutes as needed for chest pain. 25 tablet 3  . ramipril (ALTACE) 2.5 MG capsule Take 1 capsule (2.5 mg total) by mouth daily. (Patient taking differently: Take 2.5 mg by mouth in the morning.) 90 capsule 3  . sertraline (ZOLOFT) 50 MG tablet Take 50 mg by mouth at bedtime.    . simvastatin (ZOCOR) 40 MG tablet Take 1 tablet (40 mg total) by mouth at bedtime. 90 tablet 3  . traMADol (ULTRAM) 50 MG tablet Take 50 mg by mouth every 8 (eight) hours as needed for moderate pain.     . diazepam (VALIUM) 5 MG tablet Take 5-10 mg by mouth See admin instructions. Take 5-10 mg by mouth one hour prior to scan(s)    . JARDIANCE 25 MG TABS tablet Take 25 mg by mouth daily.    . propranolol (INDERAL) 10  MG tablet Take 20 mg by mouth See admin instructions. Take 20 mg (2 tablets) by mouth one hour prior to MRI scan(s)      Home: Home Living Family/patient expects to be discharged to:: Private residence Living Arrangements: Spouse/significant other Available Help at Discharge: Family,Available 24 hours/day Type of Home: House Home Access: Stairs to enter [employer] of Steps: 5 Entrance Stairs-Rails: Left Home Layout: One level Bathroom Shower/Tub: Chiropodist: Standard Home Equipment: Cane - single point,Walker - 2 wheels,Toilet riser  Lives With: Spouse  Functional History: Prior Function Level of Independence: Needs assistance Gait / Transfers Assistance Needed: uses RW for ambulation ADL's / Homemaking Assistance Needed: Wife assists with bathing Comments: falls 15 times in 3 days; scrapes and bruises Functional Status:  Mobility: Bed Mobility Overal bed mobility: Needs Assistance Bed Mobility: Supine to Sit,Sit to Supine Rolling: Max assist Sidelying to sit: Max assist,+2 for physical assistance Supine to sit: Total  assist,+2 for physical assistance,+2 for safety/equipment Sit to supine: Total assist,+2 for physical assistance,+2 for safety/equipment General bed mobility comments: max assist to roll for pad readjustment, total assist +2 for transition to/from EOB as patient with poor initation, attention and command following to engage in task Transfers Overall transfer level: Needs assistance Equipment used: Rolling walker (2 wheeled) Transfers: Sit to/from [employer] Sit to Stand: Max assist,+2 physical assistance Stand pivot transfers: Max assist,+2 physical assistance General transfer comment: deferred due to safety and poor sitting balance Ambulation/Gait Ambulation/Gait assistance: Min assist,+2 safety/equipment Gait Distance (Feet): 5 Feet (x5') Assistive device: Rolling walker (2 wheeled) Gait Pattern/deviations: Step-to pattern,Decreased stride length,Decreased weight shift to right,Wide base of support General Gait Details: unable Gait velocity: decreased    ADL: ADL Overall ADL's : Needs assistance/impaired Eating/Feeding: Modified independent,Sitting Grooming: Wash/dry face,Bed level,Minimal assistance Grooming Details (indicate cue type and reason): min assist to wash face with L hand, increased time and assist for thoroughness Upper Body Bathing: Set up,Sitting Lower Body Bathing: Maximal assistance,Cueing for safety,Sitting/lateral leans,Sit to/from stand Upper Body Dressing : Set up,Sitting Lower Body Dressing: Total assistance,+2 for physical assistance,+2 for safety/equipment,Bed level Toilet Transfer: Maximal assistance,+2 for physical assistance,Stand-pivot Toilet Transfer Details (indicate cue type and reason): deferred Toileting- Clothing Manipulation and Hygiene: Maximal assistance,Sitting/lateral lean,Sit to/from stand Functional mobility during ADLs: Total assistance,+2 for physical assistance,+2 for safety/equipment General ADL Comments: pt limited by  cognition, impaired balance and weakness  Cognition: Cognition Overall Cognitive Status: No family/caregiver present to determine baseline cognitive functioning Arousal/Alertness: Awake/alert Orientation Level: Disoriented to place,Disoriented to time,Disoriented to situation Attention: Focused,Sustained Focused Attention: Impaired Focused Attention Impairment: Verbal basic,Functional basic Sustained Attention: Impaired Sustained Attention Impairment: Verbal basic,Functional basic Memory: Impaired Memory Impairment: Retrieval deficit,Storage deficit,Decreased short term memory Decreased Short Term Memory: Verbal basic,Functional basic Awareness: Impaired Awareness Impairment: Intellectual impairment,Emergent impairment,Anticipatory impairment Problem Solving: Impaired Problem Solving Impairment: Verbal basic,Functional basic Behaviors: Impulsive,Restless,Verbal agitation,Physical agitation Safety/Judgment: Impaired Cognition Arousal/Alertness: Awake/alert Behavior During Therapy: Impulsive,Anxious,Restless Overall Cognitive Status: No family/caregiver present to determine baseline cognitive functioning Area of Impairment: Attention,Memory,Following commands,Safety/judgement,Awareness,Problem solving,Orientation Orientation Level: Disoriented to,Situation Current Attention Level: Focused Memory: Decreased recall of precautions,Decreased short-term memory Following Commands: Follows one step commands inconsistently,Follows one step commands with increased time Safety/Judgement: Decreased awareness of safety,Decreased awareness of deficits Awareness: Intellectual Problem Solving: Slow processing,Decreased initiation,Difficulty sequencing,Requires verbal cues,Requires tactile cues General Comments: patient presenting with slow processing and requires increased time for simple command following  Blood pressure 130/82, pulse (!) 103, temperature 99.5 F (37.5 C), temperature source  Oral,  resp. rate 20, height 5\' 8"  (1.727 m), weight 94.3 kg, SpO2 96 %. Physical Exam Gen: agitated HEENT: oral mucosa pink and moist, NCAT Cardio: tachycardic Chest: normal effort, normal rate of breathing Abd: soft, non-distended Ext: no edema Psych: confused and agitated Skin: intact Neurological:     Comments: Patient is alert.  Says his name and age but needed cues for month and appropriate year.  Limited medical historian.  Inconsistent following of commands. 5/5 b/l LE strength. Exam limited by confusion and agitation.    Results for orders placed or performed during the hospital encounter of 01/12/21 (from the past 24 hour(s))  Glucose, capillary     Status: Abnormal   Collection Time: 01/19/21 12:10 PM  Result Value Ref Range   Glucose-Capillary 183 (H) 70 - 99 mg/dL  Glucose, capillary     Status: Abnormal   Collection Time: 01/19/21  3:27 PM  Result Value Ref Range   Glucose-Capillary 165 (H) 70 - 99 mg/dL  Glucose, capillary     Status: Abnormal   Collection Time: 01/19/21  7:47 PM  Result Value Ref Range   Glucose-Capillary 177 (H) 70 - 99 mg/dL  Glucose, capillary     Status: Abnormal   Collection Time: 01/19/21 11:34 PM  Result Value Ref Range   Glucose-Capillary 159 (H) 70 - 99 mg/dL  Brain natriuretic peptide     Status: None   Collection Time: 01/20/21  2:39 AM  Result Value Ref Range   B Natriuretic Peptide 27.6 0.0 - 100.0 pg/mL  CBC with Differential/Platelet     Status: Abnormal   Collection Time: 01/20/21  2:39 AM  Result Value Ref Range   WBC 11.9 (H) 4.0 - 10.5 K/uL   RBC 4.75 4.22 - 5.81 MIL/uL   Hemoglobin 14.7 13.0 - 17.0 g/dL   HCT 42.1 39.0 - 52.0 %   MCV 88.6 80.0 - 100.0 fL   MCH 30.9 26.0 - 34.0 pg   MCHC 34.9 30.0 - 36.0 g/dL   RDW 13.3 11.5 - 15.5 %   Platelets 219 150 - 400 K/uL   nRBC 0.0 0.0 - 0.2 %   Neutrophils Relative % 75 %   Neutro Abs 8.9 (H) 1.7 - 7.7 K/uL   Lymphocytes Relative 13 %   Lymphs Abs 1.5 0.7 - 4.0 K/uL    Monocytes Relative 12 %   Monocytes Absolute 1.4 (H) 0.1 - 1.0 K/uL   Eosinophils Relative 0 %   Eosinophils Absolute 0.0 0.0 - 0.5 K/uL   Basophils Relative 0 %   Basophils Absolute 0.0 0.0 - 0.1 K/uL   Immature Granulocytes 0 %   Abs Immature Granulocytes 0.03 0.00 - 0.07 K/uL  Comprehensive metabolic panel     Status: Abnormal   Collection Time: 01/20/21  2:39 AM  Result Value Ref Range   Sodium 131 (L) 135 - 145 mmol/L   Potassium 4.0 3.5 - 5.1 mmol/L   Chloride 105 98 - 111 mmol/L   CO2 18 (L) 22 - 32 mmol/L   Glucose, Bld 176 (H) 70 - 99 mg/dL   BUN 18 8 - 23 mg/dL   Creatinine, Ser 0.82 0.61 - 1.24 mg/dL   Calcium 9.5 8.9 - 10.3 mg/dL   Total Protein 6.4 (L) 6.5 - 8.1 g/dL   Albumin 2.9 (L) 3.5 - 5.0 g/dL   AST 31 15 - 41 U/L   ALT 14 0 - 44 U/L   Alkaline Phosphatase 95 38 - 126 U/L  Total Bilirubin 1.7 (H) 0.3 - 1.2 mg/dL   GFR, Estimated >60 >60 mL/min   Anion gap 8 5 - 15  Magnesium     Status: None   Collection Time: 01/20/21  2:39 AM  Result Value Ref Range   Magnesium 2.1 1.7 - 2.4 mg/dL  Procalcitonin     Status: None   Collection Time: 01/20/21  2:39 AM  Result Value Ref Range   Procalcitonin <0.10 ng/mL  Glucose, capillary     Status: Abnormal   Collection Time: 01/20/21  4:01 AM  Result Value Ref Range   Glucose-Capillary 167 (H) 70 - 99 mg/dL   DG Chest Port 1 View  Result Date: 01/20/2021 CLINICAL DATA:  Dyspnea, chest pain EXAM: PORTABLE CHEST 1 VIEW COMPARISON:  01/17/2021 FINDINGS: Lungs volumes are small, but are symmetric and are clear. No pneumothorax or pleural effusion. Cardiac size within normal limits. Pulmonary vascularity is normal. Osseous structures are age-appropriate. No acute bone abnormality. IMPRESSION: No active disease. Electronically Signed   By: Fidela Salisbury MD   On: 01/20/2021 08:09     Assessment/Plan: Diagnosis: Periventricular stroke 1. Does the need for close, 24 hr/day medical supervision in concert with the patient's  rehab needs make it unreasonable for this patient to be served in a less intensive setting? Yes 2. Co-Morbidities requiring supervision/potential complications:  1. Right knee OA: can consider voltaren gel.  2. Colonic polyps 3. HLD 4. HTN: monitor BP TID 5. CAD 6. OSA 7. Tachycardia: monitor HR TID 3. Due to bladder management, bowel management, safety, skin/wound care, disease management, medication administration, pain management and patient education, does the patient require 24 hr/day rehab nursing? Yes 4. Does the patient require coordinated care of a physician, rehab nurse, therapy disciplines of PT, OT, SLP to address physical and functional deficits in the context of the above medical diagnosis(es)? Yes Addressing deficits in the following areas: balance, endurance, locomotion, strength, transferring, bowel/bladder control, bathing, dressing, feeding, grooming, toileting and cognition 5. Can the patient actively participate in an intensive therapy program of at least 3 hrs of therapy per day at least 5 days per week? Potentially, will monitor as patient is less agitated 6. The potential for patient to make measurable gains while on inpatient rehab is good 7. Anticipated functional outcomes upon discharge from inpatient rehab are supervision  with PT, supervision with OT, supervision with SLP. 8. Estimated rehab length of stay to reach the above functional goals is: 2-3 weeks 9. Anticipated discharge destination: Home 10. Overall Rehab/Functional Prognosis: good  RECOMMENDATIONS: This patient's condition is appropriate for continued rehabilitative care in the following setting: CIR Patient has agreed to participate in recommended program. N/A Note that insurance prior authorization may be required for reimbursement for recommended care.  Comment: Thank you for this consult. Admission coordinator to follow.   I have personally performed a face to face diagnostic evaluation,  including, but not limited to relevant history and physical exam findings, of this patient and developed relevant assessment and plan.  Additionally, I have reviewed and concur with the physician assistant's documentation above.  Leeroy Cha, MD  Lavon Paganini Reddick, PA-C 01/20/2021

## 2021-01-21 ENCOUNTER — Inpatient Hospital Stay (HOSPITAL_COMMUNITY): Payer: HMO

## 2021-01-21 DIAGNOSIS — I10 Essential (primary) hypertension: Secondary | ICD-10-CM | POA: Diagnosis not present

## 2021-01-21 LAB — URINALYSIS, ROUTINE W REFLEX MICROSCOPIC
Bacteria, UA: NONE SEEN
Bilirubin Urine: NEGATIVE
Glucose, UA: >=500 mg/dL — AB
Ketones, ur: 80 mg/dL — AB
Leukocytes,Ua: NEGATIVE
Nitrite: NEGATIVE
Protein, ur: 30 mg/dL — AB
Specific Gravity, Urine: 1.03 (ref 1.005–1.030)
pH: 5 (ref 5.0–8.0)

## 2021-01-21 LAB — CBC WITH DIFFERENTIAL/PLATELET
Abs Immature Granulocytes: 0.04 10*3/uL (ref 0.00–0.07)
Basophils Absolute: 0 10*3/uL (ref 0.0–0.1)
Basophils Relative: 0 %
Eosinophils Absolute: 0.1 10*3/uL (ref 0.0–0.5)
Eosinophils Relative: 0 %
HCT: 40.5 % (ref 39.0–52.0)
Hemoglobin: 14 g/dL (ref 13.0–17.0)
Immature Granulocytes: 0 %
Lymphocytes Relative: 17 %
Lymphs Abs: 2 10*3/uL (ref 0.7–4.0)
MCH: 30.4 pg (ref 26.0–34.0)
MCHC: 34.6 g/dL (ref 30.0–36.0)
MCV: 88 fL (ref 80.0–100.0)
Monocytes Absolute: 1.6 10*3/uL — ABNORMAL HIGH (ref 0.1–1.0)
Monocytes Relative: 13 %
Neutro Abs: 8.2 10*3/uL — ABNORMAL HIGH (ref 1.7–7.7)
Neutrophils Relative %: 70 %
Platelets: 228 10*3/uL (ref 150–400)
RBC: 4.6 MIL/uL (ref 4.22–5.81)
RDW: 13.4 % (ref 11.5–15.5)
WBC: 11.9 10*3/uL — ABNORMAL HIGH (ref 4.0–10.5)
nRBC: 0 % (ref 0.0–0.2)

## 2021-01-21 LAB — GLUCOSE, CAPILLARY
Glucose-Capillary: 174 mg/dL — ABNORMAL HIGH (ref 70–99)
Glucose-Capillary: 197 mg/dL — ABNORMAL HIGH (ref 70–99)
Glucose-Capillary: 198 mg/dL — ABNORMAL HIGH (ref 70–99)
Glucose-Capillary: 140 mg/dL — ABNORMAL HIGH (ref 70–99)
Glucose-Capillary: 188 mg/dL — ABNORMAL HIGH (ref 70–99)

## 2021-01-21 LAB — COMPREHENSIVE METABOLIC PANEL
ALT: 18 U/L (ref 0–44)
AST: 30 U/L (ref 15–41)
Albumin: 2.7 g/dL — ABNORMAL LOW (ref 3.5–5.0)
Alkaline Phosphatase: 93 U/L (ref 38–126)
Anion gap: 10 (ref 5–15)
BUN: 15 mg/dL (ref 8–23)
CO2: 22 mmol/L (ref 22–32)
Calcium: 9.5 mg/dL (ref 8.9–10.3)
Chloride: 102 mmol/L (ref 98–111)
Creatinine, Ser: 0.75 mg/dL (ref 0.61–1.24)
GFR, Estimated: >60 mL/min (ref >60)
Glucose, Bld: 158 mg/dL — ABNORMAL HIGH (ref 70–99)
Potassium: 3.8 mmol/L (ref 3.5–5.1)
Sodium: 134 mmol/L — ABNORMAL LOW (ref 135–145)
Total Bilirubin: 1.3 mg/dL — ABNORMAL HIGH (ref 0.3–1.2)
Total Protein: 5.9 g/dL — ABNORMAL LOW (ref 6.5–8.1)

## 2021-01-21 LAB — MAGNESIUM: Magnesium: 2.1 mg/dL (ref 1.7–2.4)

## 2021-01-21 LAB — BRAIN NATRIURETIC PEPTIDE: B Natriuretic Peptide: 42.5 pg/mL (ref 0.0–100.0)

## 2021-01-21 LAB — PROCALCITONIN: Procalcitonin: <0.1 ng/mL

## 2021-01-21 IMAGING — DX DG CHEST 1V PORT
1 series · 1 of 1 positions shown · non-contrast
Comparison: [DATE]

CLINICAL DATA: Shortness of breath.

EXAM:
PORTABLE CHEST 1 VIEW

[chest ap]
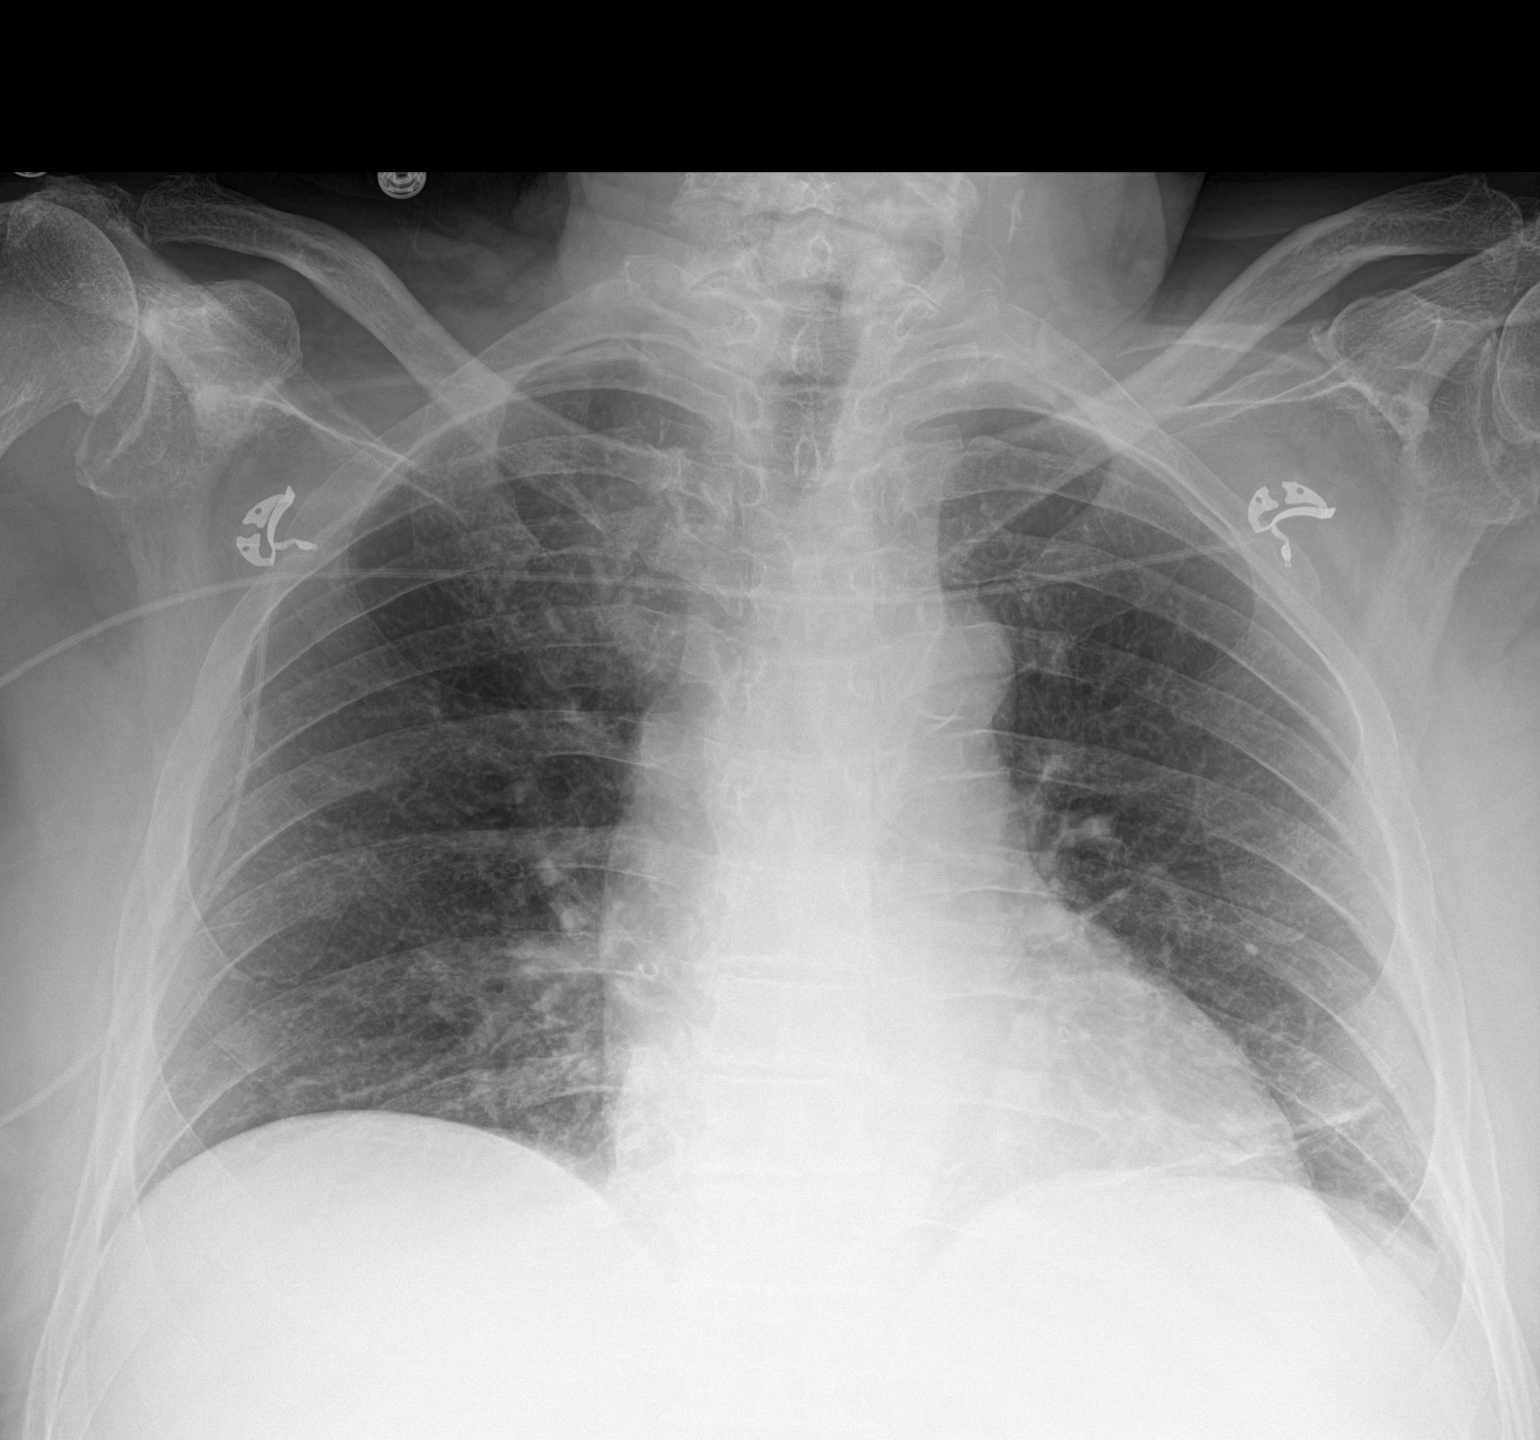

[1 of 1 positions shown; findings below may reference images not displayed]

FINDINGS: The heart size and mediastinal contours are within normal limits.
Mild linear atelectasis is identified in the left lung base. There
is no focal pneumonia, pulmonary edema or pleural effusion. The
visualized skeletal structures are stable.
IMPRESSION: Mild linear atelectasis in the left lung base.  No focal pneumonia.

## 2021-01-21 MED ORDER — QUETIAPINE FUMARATE 25 MG PO TABS
25.0000 mg | ORAL_TABLET | Freq: Two times a day (BID) | ORAL | Status: DC
  Administered 2021-01-21 – 2021-01-27 (×11): 25 mg via ORAL
  Filled 2021-01-21 (×12): qty 1

## 2021-01-21 MED ORDER — LACTATED RINGERS IV SOLN: INTRAVENOUS | Status: AC

## 2021-01-21 MED ORDER — TAMSULOSIN HCL 0.4 MG PO CAPS
0.4000 mg | ORAL_CAPSULE | Freq: Every day | ORAL | Status: DC
  Administered 2021-01-21 – 2021-01-27 (×6): 0.4 mg via ORAL
  Filled 2021-01-21 (×7): qty 1

## 2021-01-21 MED ORDER — CHLORHEXIDINE GLUCONATE CLOTH 2 % EX PADS
6.0000 | MEDICATED_PAD | Freq: Every day | CUTANEOUS | Status: DC
  Administered 2021-01-22 – 2021-01-27 (×5): 6 via TOPICAL

## 2021-01-21 MED ORDER — METOPROLOL TARTRATE 5 MG/5ML IV SOLN
5.0000 mg | Freq: Once | INTRAVENOUS | Status: AC
  Administered 2021-01-21: 5 mg via INTRAVENOUS
  Filled 2021-01-21: qty 5

## 2021-01-21 NOTE — Progress Notes (Addendum)
PROGRESS NOTE        PATIENT DETAILS Name: Vincent [name] Age: 70 y.o. Sex: male Date of Birth: 02-21-1951 Admit Date: 01/12/2021 Admitting Physician Lequita Halt, MD IOE:VOJJ, Emily Filbert., MD  Brief Narrative: Patient is a 70 y.o. male with history of insulin-dependent DM-2, HTN, CAD, chronic systolic heart failure, chronic pain syndrome on narcotics/benzos-presented with a 2-week history of worsening confusion, visual hallucinations and frequent falls.  Spouse reports slurred speech for a few days as well.  Patient was subsequently admitted to the hospitalist service for further evaluation and treatment.  Significant events: 4/7>> admit for evaluation of slurred speech, confusion, hallucination, frequent falls  Significant studies: 4/7>> CT head: Negative for acute abnormalities 4/7>> CT C-spine: No fracture 4/7>> x-ray right knee: Right knee replacement without hardware loosening or fracture 4/8>> Echo: EF 45-50%, apical akinesis, grade 1 diastolic dysfunction.  Swirling of contrast at apex consistent with low flow but no thrombus, +ve WMA. 4/8>> vitamin B12: 72 4/10 >> MRI Brain and C Spine - Small subacute infarct in the right parietal white matter, likely a current manifestation of the patient's extensive chronic small vessel ischemia, Moderate right neuroforaminal stenosis at C3-C4.  4/16>> CT Chest - Abd-Pelvis with IV contrast    Antimicrobial therapy: None  Microbiology data: None  Procedures : None  Consults: Neurology x 2  DVT Prophylaxis : enoxaparin (LOVENOX) injection 40 mg Start: 01/13/21 1000   Subjective: Patient in bed, in no distress but more sleepy today, denies any headache or chest pain.   Assessment/Plan:  Encephalopathy/slurred speech/visual hallucinations: Probable polypharmacy-but need to rule out CVA due to slurred speech-echo findings (smoke seen)-vitamin B12 deficiency may be contributing as well-continue to hold  benzos/narcotics-increased Seroquel to 50 mg twice daily-use Haldol as needed.  Clinically improving, MRI brain noted and DW Neuro 01/15/21, neurological changes likely due to prolonged B12 deficiency, according to the wife symptoms have been gradually progressive over several months. Neurology following, SNF vs CIR. Will drop Seroquel dose on 01/21/2021 as he appears to be slightly more drowsy morning of 01/21/2021.  Incidental small right parietal stroke noted.  He is on aspirin and statin, no deficits contributed to this, continue to monitor.  May require placement.  LDL under 70, A1c mildly elevated.  Vitamin B12 deficiency: On B12 supplementation-recheck vitamin B12 levels in 3 months.  Mild leukocytosis with low-grade fevers and tachycardia due to aspiration pneumonia in the hospital.   Chest x-ray positive sided infiltrate, clinically also suspicious aspiration pneumonia, placed on appropriate antibiotics on 01/17/21 based on his allergy profile.  Continue soft diet with feeding assistance and aspiration precautions, speech following.  He continues to be persistently tachycardic, see below.  Urinary retention.  Note multiple random bladder scans were obtained however we could never prove that he had urinary retention 9? Poor windows) until on 01/21/2021 when a repeat bladder scan showed 790 cc of urine.  I think this is the cause of his low-grade blood pressure and persistent tachycardia.  We will proceed with Foley and Flomax as this has been going on for several days.   Combined chronic systolic and diastolic heart failure: Euvolemic-since slightly dehydrated on 01/15/2021 hold further diuretics and hydrate.  CAD-s/p PCI to LAD in 2007: No anginal symptoms-has EF 45% with apical akinesis on echo-most recent nuclear stress test in 2017 showed a fixed defect  in the apex stable for outpatient follow-up with primary cardiologist.  Remains on aspirin. Will need outpt Cards follow up in 7-10 days post  DC.  HTN: Blood pressure controlled-continue present regimen.  Holding ACE inhibitor due to AKI.  HLD: Continue statin-LDL 60.  Mild clinical dehydration with hyponatremia and AKI.  Hydrate with IV fluids and monitor.    DM-2 (A1c 7.2 on 4/7): CBG stable with SSI-hold all oral hypoglycemic agents  Recent Labs    01/20/21 2136 01/21/21 0638 01/21/21 0747  GLUCAP 199* 174* 197*    Obesity: Estimated body mass index is 31.61 kg/m as calculated from the following:   Height as of this encounter: 5\' 8"  (1.727 m).   Weight as of this encounter: 94.3 kg.   Diet: Diet Order            DIET DYS 2 Room service appropriate? Yes; Fluid consistency: Thin  Diet effective now                  Code Status: Full code   Family Communication:  [phone] updated on 01/14/21, message left at 10 AM on 01/15/2021, 01/16/21 updated, updated bedside 01/17/21  Disposition Plan: Status is: Inpatient  Remains inpatient appropriate because:Inpatient level of care appropriate due to severity of illness   Dispo: The patient is from: Home              Anticipated d/c is to: Home              Patient currently is not medically stable to d/c.   Difficult to place patient No   Barriers to Discharge: Ongoing altered mental status-awaiting further work-up-see above  Antimicrobial agents: Anti-infectives (From admission, onward)   Start     Dose/Rate Route Frequency Ordered Stop   01/19/21 2200  doxycycline (VIBRA-TABS) tablet 100 mg        100 mg Oral Every 12 hours 01/19/21 0903 01/22/21 0759   01/17/21 1830  doxycycline (VIBRAMYCIN) 100 mg in sodium chloride 0.9 % 250 mL IVPB  Status:  Discontinued        100 mg 125 mL/hr over 120 Minutes Intravenous Every 12 hours 01/17/21 1730 01/19/21 0903   01/17/21 1830  meropenem (MERREM) 1 g in sodium chloride 0.9 % 100 mL IVPB        1 g 200 mL/hr over 30 Minutes Intravenous Every 8 hours 01/17/21 1757 01/21/21 2359       Time  spent:  35 minutes-Greater than 50% of this time was spent in counseling, explanation of diagnosis, planning of further management, and coordination of care.  MEDICATIONS:  Scheduled Meds: .  stroke: mapping our early stages of recovery book   Does not apply Once  . amLODipine  10 mg Oral Daily  . aspirin EC  81 mg Oral Daily  . bisacodyl  10 mg Rectal Daily  . cyanocobalamin  1,000 mcg Subcutaneous Q30 days  . docusate sodium  200 mg Oral BID  . doxycycline  100 mg Oral Q12H  . enoxaparin (LOVENOX) injection  40 mg Subcutaneous Q24H  . insulin aspart  0-15 Units Subcutaneous TID WC  . isosorbide mononitrate  30 mg Oral Daily  . metoprolol tartrate  100 mg Oral BID  . multivitamin with minerals  1 tablet Oral Daily  . polyethylene glycol  17 g Oral BID  . QUEtiapine  50 mg Oral BID  . simvastatin  40 mg Oral QHS  . thiamine  100 mg Oral Daily  Continuous Infusions: . meropenem (MERREM) IV 1 g (01/21/21 0322)   PRN Meds:.acetaminophen **OR** [DISCONTINUED] acetaminophen (TYLENOL) oral liquid 160 mg/5 mL **OR** [DISCONTINUED] acetaminophen, haloperidol lactate, hydrALAZINE, metoprolol tartrate, senna-docusate   PHYSICAL EXAM: Vital signs: Vitals:   01/21/21 0433 01/21/21 0700 01/21/21 0744 01/21/21 1013  BP: (!) 147/78 (!) 150/86  (!) 147/94  Pulse: 97 (!) 114 (!) 114 (!) 115  Resp:  20    Temp:  99.5 F (37.5 C)    TempSrc:  Axillary    SpO2: 94% 100%    Weight:      Height:       Filed Weights   01/17/21 1749  Weight: 94.3 kg   Body mass index is 31.61 kg/m.   Gen Exam:   Drowsy this am, remains extremely weak in both lower extremities, speech is more comprehensible but still dysarthric [place].AT,PERRAL Supple Neck,No JVD, No cervical lymphadenopathy appriciated.  Symmetrical Chest wall movement, Good air movement bilaterally, CTAB RRR,No Gallops, Rubs or new Murmurs, No Parasternal Heave +ve B.Sounds, Abd Soft, No tenderness, No organomegaly appriciated, No  rebound - guarding or rigidity. No Cyanosis, Clubbing or edema, No new Rash or bruise   I have personally reviewed following labs and imaging studies  LABORATORY DATA:  Recent Labs  Lab 01/17/21 0505 01/18/21 0606 01/19/21 0254 01/20/21 0239 01/21/21 0218  WBC 12.9* 11.9* 11.7* 11.9* 11.9*  HGB 16.2 15.0 14.8 14.7 14.0  HCT 46.5 44.2 43.2 42.1 40.5  PLT 245 204 234 219 228  MCV 90.6 90.6 89.1 88.6 88.0  MCH 31.6 30.7 30.5 30.9 30.4  MCHC 34.8 33.9 34.3 34.9 34.6  RDW 13.5 13.4 13.6 13.3 13.4  LYMPHSABS 1.9 1.0 1.5 1.5 2.0  MONOABS 1.4* 1.3* 1.6* 1.4* 1.6*  EOSABS 0.0 0.0 0.0 0.0 0.1  BASOSABS 0.0 0.0 0.0 0.0 0.0    Recent Labs  Lab 01/17/21 0505 01/17/21 0800 01/18/21 0606 01/19/21 0254 01/19/21 0529 01/19/21 0707 01/20/21 0239 01/21/21 0218  NA 138  --  135 134*  --   --  131* 134*  K 4.4  --  4.2 5.7*  --  4.0 4.0 3.8  CL 106  --  105 105  --   --  105 102  CO2 17*  --  14* 17*  --   --  18* 22  GLUCOSE 184*  --  164* 170*  --   --  176* 158*  BUN 16  --  14 20  --   --  18 15  CREATININE 0.98  --  0.96 0.94  --   --  0.82 0.75  CALCIUM 9.7  --  9.5 9.6  --   --  9.5 9.5  AST 16  --  15 26  --   --  31 30  ALT 12  --  11 8  --   --  14 18  ALKPHOS 112  --  103 98  --   --  95 93  BILITOT 2.6*  --  2.7* 2.9*  --   --  1.7* 1.3*  ALBUMIN 3.7  --  3.3* 3.2*  --   --  2.9* 2.7*  MG 2.4  --  2.1 2.4  --   --  2.1 2.1  PROCALCITON  --  <0.10 <0.10  --  <0.10  --  <0.10 <0.10  BNP 51.0  --  35.2 34.8  --   --  27.6 42.5   Lab Results  Component Value Date  TSH 2.095 01/12/2021    Lab Results  Component Value Date   HGBA1C 7.2 (H) 01/12/2021   Lab Results  Component Value Date   CHOL 118 01/12/2021   HDL 46 01/12/2021   LDLCALC 60 01/12/2021   TRIG 61 01/12/2021   CHOLHDL 2.6 01/12/2021    Coagulation Profile: No results for input(s): INR, PROTIME in the last 168 hours.  Cardiac Enzymes: No results for input(s): CKTOTAL, CKMB, CKMBINDEX,  TROPONINI in the last 168 hours.  BNP (last 3 results) No results for input(s): PROBNP in the last 8760 hours.  Lipid Profile: No results for input(s): CHOL, HDL, LDLCALC, TRIG, CHOLHDL, LDLDIRECT in the last 72 hours.  Thyroid Function Tests: No results for input(s): TSH, T4TOTAL, FREET4, T3FREE, THYROIDAB in the last 72 hours.  Anemia Panel: No results for input(s): VITAMINB12, FOLATE, FERRITIN, TIBC, IRON, RETICCTPCT in the last 72 hours.  Urine analysis:    Component Value Date/Time   COLORURINE AMBER (A) 01/17/2021 1816   APPEARANCEUR CLEAR 01/17/2021 1816   LABSPEC 1.033 (H) 01/17/2021 1816   PHURINE 5.0 01/17/2021 1816   GLUCOSEU >=500 (A) 01/17/2021 1816   HGBUR NEGATIVE 01/17/2021 1816   BILIRUBINUR NEGATIVE 01/17/2021 1816   KETONESUR 80 (A) 01/17/2021 1816   PROTEINUR NEGATIVE 01/17/2021 1816   UROBILINOGEN 0.2 01/06/2014 1257   NITRITE NEGATIVE 01/17/2021 1816   LEUKOCYTESUR NEGATIVE 01/17/2021 1816    Sepsis Labs: Lactic Acid, Venous No results found for: LATICACIDVEN  MICROBIOLOGY: Recent Results (from the past 240 hour(s))  Resp Panel by RT-PCR (Flu A&B, Covid) Nasopharyngeal Swab     Status: None   Collection Time: 01/12/21  4:19 PM   Specimen: Nasopharyngeal Swab; Nasopharyngeal(NP) swabs in vial transport medium  Result Value Ref Range Status   SARS Coronavirus 2 by RT PCR NEGATIVE NEGATIVE Final    Comment: (NOTE) SARS-CoV-2 target nucleic acids are NOT DETECTED.  The SARS-CoV-2 RNA is generally detectable in upper respiratory specimens during the acute phase of infection. The lowest concentration of SARS-CoV-2 viral copies this assay can detect is 138 copies/mL. A negative result does not preclude SARS-Cov-2 infection and should not be used as the sole basis for treatment or other patient management decisions. A negative result may occur with  improper specimen collection/handling, submission of specimen other than nasopharyngeal swab, presence  of viral mutation(s) within the areas targeted by this assay, and inadequate number of viral copies(<138 copies/mL). A negative result must be combined with clinical observations, patient history, and epidemiological information. The expected result is Negative.  Fact Sheet for Patients:  EntrepreneurPulse.com.au  Fact Sheet for Healthcare Providers:  IncredibleEmployment.be  This test is no t yet approved or cleared by the [place] FDA and  has been authorized for detection and/or diagnosis of SARS-CoV-2 by FDA under an Emergency Use Authorization (EUA). This EUA will remain  in effect (meaning this test can be used) for the duration of the COVID-19 declaration under Section 564(b)(1) of the Act, 21 U.S.C.section 360bbb-3(b)(1), unless the authorization is terminated  or revoked sooner.       Influenza A by PCR NEGATIVE NEGATIVE Final   Influenza B by PCR NEGATIVE NEGATIVE Final    Comment: (NOTE) The Xpert Xpress SARS-CoV-2/FLU/RSV plus assay is intended as an aid in the diagnosis of influenza from Nasopharyngeal swab specimens and should not be used as a sole basis for treatment. Nasal washings and aspirates are unacceptable for Xpert Xpress SARS-CoV-2/FLU/RSV testing.  Fact Sheet for Patients: EntrepreneurPulse.com.au  Fact Sheet for  Healthcare Providers: IncredibleEmployment.be  This test is not yet approved or cleared by the [place] and has been authorized for detection and/or diagnosis of SARS-CoV-2 by FDA under an Emergency Use Authorization (EUA). This EUA will remain in effect (meaning this test can be used) for the duration of the COVID-19 declaration under Section 564(b)(1) of the Act, 21 U.S.C. section 360bbb-3(b)(1), unless the authorization is terminated or revoked.  Performed at [facility], [place] [address] [place], [place] [address]   Culture, Urine     Status:  Abnormal   Collection Time: 01/17/21  6:16 PM   Specimen: Urine, Random  Result Value Ref Range Status   Specimen Description URINE, RANDOM  Final   Special Requests   Final    NONE Performed at [facility], [place] [address] [place], [place] [address]    Culture (A)  Final    >=100,000 COLONIES/mL MULTIPLE SPECIES PRESENT, SUGGEST RECOLLECTION   Report Status 01/19/2021 FINAL  Final  Culture, blood (routine x 2)     Status: None (Preliminary result)   Collection Time: 01/17/21  7:39 PM   Specimen: BLOOD RIGHT HAND  Result Value Ref Range Status   Specimen Description BLOOD RIGHT HAND  Final   Special Requests   Final    BOTTLES DRAWN AEROBIC AND ANAEROBIC Blood Culture adequate volume   Culture   Final    NO GROWTH 3 DAYS Performed at [facility], [place] [address], [place],  [address]    Report Status PENDING  Incomplete  Culture, blood (routine x 2)     Status: None (Preliminary result)   Collection Time: 01/17/21  7:42 PM   Specimen: BLOOD  Result Value Ref Range Status   Specimen Description BLOOD RIGHT ANTECUBITAL  Final   Special Requests   Final    BOTTLES DRAWN AEROBIC AND ANAEROBIC Blood Culture adequate volume   Culture   Final    NO GROWTH 3 DAYS Performed at [facility], [place] [address], [place],  [address]    Report Status PENDING  Incomplete    RADIOLOGY STUDIES/RESULTS: DG Chest Port 1 View  Result Date: 01/21/2021 CLINICAL DATA:  Shortness of breath. EXAM: PORTABLE CHEST 1 VIEW COMPARISON:  January 20, 2021 FINDINGS: The heart size and mediastinal contours are within normal limits. Mild linear atelectasis is identified in the left lung base. There is no focal pneumonia, pulmonary edema or pleural effusion. The visualized skeletal structures are stable. IMPRESSION: Mild linear atelectasis in the left lung base.  No focal pneumonia. Electronically Signed   By: Abelardo Diesel M.D.   On: 01/21/2021 08:44   DG Chest Port 1  View  Result Date: 01/20/2021 CLINICAL DATA:  Dyspnea, chest pain EXAM: PORTABLE CHEST 1 VIEW COMPARISON:  01/17/2021 FINDINGS: Lungs volumes are small, but are symmetric and are clear. No pneumothorax or pleural effusion. Cardiac size within normal limits. Pulmonary vascularity is normal. Osseous structures are age-appropriate. No acute bone abnormality. IMPRESSION: No active disease. Electronically Signed   By: Fidela Salisbury MD   On: 01/20/2021 08:09     LOS: 9 days   Lala Lund, MD  [facility]  01/21/2021, 10:51 AM

## 2021-01-21 NOTE — Plan of Care (Signed)
  Problem: Education: Goal: Knowledge of disease or condition will improve Outcome: Progressing Goal: Knowledge of secondary prevention will improve Outcome: Progressing Goal: Knowledge of patient specific risk factors addressed and post discharge goals established will improve Outcome: Progressing Goal: Individualized Educational Video(s) Outcome: Progressing   Problem: Coping: Goal: Will verbalize positive feelings about self Outcome: Progressing Goal: Will identify appropriate support needs Outcome: Progressing   Problem: Health Behavior/Discharge Planning: Goal: Ability to manage health-related needs will improve Outcome: Progressing   Problem: Self-Care: Goal: Ability to participate in self-care as condition permits will improve Outcome: Progressing Goal: Verbalization of feelings and concerns over difficulty with self-care will improve Outcome: Progressing Goal: Ability to communicate needs accurately will improve Outcome: Progressing   Problem: Nutrition: Goal: Risk of aspiration will decrease Outcome: Progressing Goal: Dietary intake will improve Outcome: Progressing   Problem: Ischemic Stroke/TIA Tissue Perfusion: Goal: Complications of ischemic stroke/TIA will be minimized Outcome: Progressing   Problem: Safety: Goal: Non-violent Restraint(s) Outcome: Progressing

## 2021-01-21 NOTE — Progress Notes (Signed)
Inpatient Rehab Admissions Coordinator:   I spoke with pt.'s wife regarding potential CIR admission. Pt. Remains total A with therapies, with PT and OT recommending SNF for rehab. Pt.'s wife states that she would like Pt. To go to [facility] for SNF level rehab. TOC aware. CIR will sign off.   Clemens Catholic, [place], [place] Admissions Coordinator  [phone] ([place]) [phone] (office)

## 2021-01-21 NOTE — TOC Progression Note (Signed)
Transition of Care [facility]) - Progression Note    Patient Details  Name: Vincent [name] MRN: [id number] Date of Birth: October 29, 1950  Transition of Care [facility]) CM/SW [place], [place] Phone Number: 01/21/2021, 10:27 AM  Clinical Narrative:    CSW spoke with pt's spouse, [name]. She noted she would like for pt to go to [facility]. CSW noted that pt is still not ready, will hold off on insurance auth at this time. CSW left a message for facility to update them. SW will continue to follow.   Expected Discharge Plan: Skilled Nursing Facility Barriers to Discharge: Continued Medical Work up,Insurance Authorization,Facility will not accept until restraint criteria met,Awaiting State Approval Forensic scientist)  Expected Discharge Plan and Services Expected Discharge Plan: [place] arrangements for the past 2 months: Single Family Home                                       Social Determinants of Health (SDOH) Interventions    Readmission Risk Interventions No flowsheet data found.

## 2021-01-21 NOTE — Progress Notes (Signed)
V.O.,"to give 5 mg Metop IV push for SBP >150,now". Per Dr. Marlowe Sax.

## 2021-01-22 ENCOUNTER — Inpatient Hospital Stay (HOSPITAL_COMMUNITY): Payer: HMO

## 2021-01-22 DIAGNOSIS — R509 Fever, unspecified: Secondary | ICD-10-CM

## 2021-01-22 DIAGNOSIS — I10 Essential (primary) hypertension: Secondary | ICD-10-CM | POA: Diagnosis not present

## 2021-01-22 LAB — CBC WITH DIFFERENTIAL/PLATELET
Abs Immature Granulocytes: 0.07 10*3/uL (ref 0.00–0.07)
Basophils Absolute: 0 10*3/uL (ref 0.0–0.1)
Basophils Relative: 0 %
Eosinophils Absolute: 0.1 10*3/uL (ref 0.0–0.5)
Eosinophils Relative: 1 %
HCT: 40.5 % (ref 39.0–52.0)
Hemoglobin: 13.5 g/dL (ref 13.0–17.0)
Immature Granulocytes: 1 %
Lymphocytes Relative: 13 %
Lymphs Abs: 1.3 10*3/uL (ref 0.7–4.0)
MCH: 30.5 pg (ref 26.0–34.0)
MCHC: 33.3 g/dL (ref 30.0–36.0)
MCV: 91.6 fL (ref 80.0–100.0)
Monocytes Absolute: 1.1 10*3/uL — ABNORMAL HIGH (ref 0.1–1.0)
Monocytes Relative: 11 %
Neutro Abs: 7.9 10*3/uL — ABNORMAL HIGH (ref 1.7–7.7)
Neutrophils Relative %: 74 %
Platelets: 216 10*3/uL (ref 150–400)
RBC: 4.42 MIL/uL (ref 4.22–5.81)
RDW: 13.2 % (ref 11.5–15.5)
WBC: 10.6 10*3/uL — ABNORMAL HIGH (ref 4.0–10.5)
nRBC: 0 % (ref 0.0–0.2)

## 2021-01-22 LAB — COMPREHENSIVE METABOLIC PANEL
ALT: 22 U/L (ref 0–44)
AST: 39 U/L (ref 15–41)
Albumin: 2.5 g/dL — ABNORMAL LOW (ref 3.5–5.0)
Alkaline Phosphatase: 89 U/L (ref 38–126)
Anion gap: 10 (ref 5–15)
BUN: 17 mg/dL (ref 8–23)
CO2: 21 mmol/L — ABNORMAL LOW (ref 22–32)
Calcium: 9.2 mg/dL (ref 8.9–10.3)
Chloride: 101 mmol/L (ref 98–111)
Creatinine, Ser: 0.67 mg/dL (ref 0.61–1.24)
GFR, Estimated: >60 mL/min (ref >60)
Glucose, Bld: 185 mg/dL — ABNORMAL HIGH (ref 70–99)
Potassium: 4.2 mmol/L (ref 3.5–5.1)
Sodium: 132 mmol/L — ABNORMAL LOW (ref 135–145)
Total Bilirubin: 1.3 mg/dL — ABNORMAL HIGH (ref 0.3–1.2)
Total Protein: 5.7 g/dL — ABNORMAL LOW (ref 6.5–8.1)

## 2021-01-22 LAB — CULTURE, BLOOD (ROUTINE X 2)
Culture: NO GROWTH
Culture: NO GROWTH
Special Requests: ADEQUATE
Special Requests: ADEQUATE

## 2021-01-22 LAB — GLUCOSE, CAPILLARY
Glucose-Capillary: 171 mg/dL — ABNORMAL HIGH (ref 70–99)
Glucose-Capillary: 192 mg/dL — ABNORMAL HIGH (ref 70–99)
Glucose-Capillary: 152 mg/dL — ABNORMAL HIGH (ref 70–99)
Glucose-Capillary: 180 mg/dL — ABNORMAL HIGH (ref 70–99)
Glucose-Capillary: 165 mg/dL — ABNORMAL HIGH (ref 70–99)

## 2021-01-22 LAB — URINE CULTURE: Culture: NO GROWTH

## 2021-01-22 LAB — PROCALCITONIN: Procalcitonin: <0.1 ng/mL

## 2021-01-22 LAB — MAGNESIUM: Magnesium: 2 mg/dL (ref 1.7–2.4)

## 2021-01-22 LAB — BRAIN NATRIURETIC PEPTIDE: B Natriuretic Peptide: 30.6 pg/mL (ref 0.0–100.0)

## 2021-01-22 IMAGING — US US ABDOMEN LIMITED RUQ/ASCITES
1 series · 14 of 25 positions shown · non-contrast
Comparison: CT from earlier in the same day.

CLINICAL DATA: Abdominal pain and suspicious findings on recent CT
examination.

EXAM:
ULTRASOUND ABDOMEN LIMITED RIGHT UPPER QUADRANT

[Series 1: us abdomen limited ruq (liver/gb) · 14 of 55 slices shown]
[im 1/55]
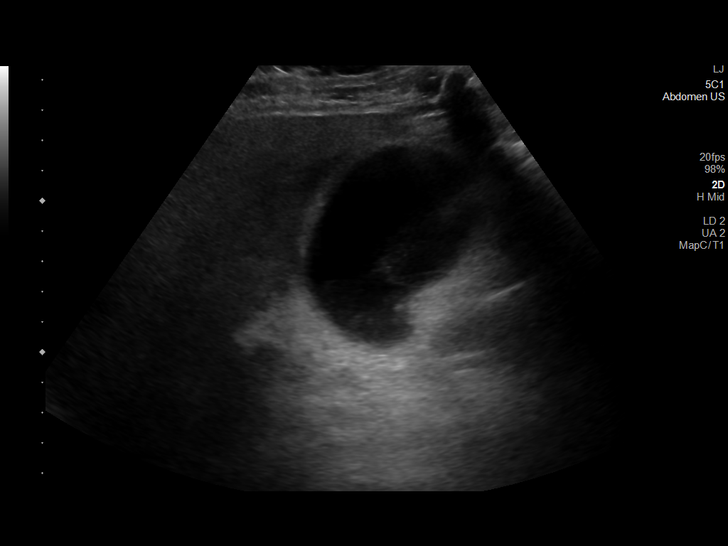
[im 5/55]
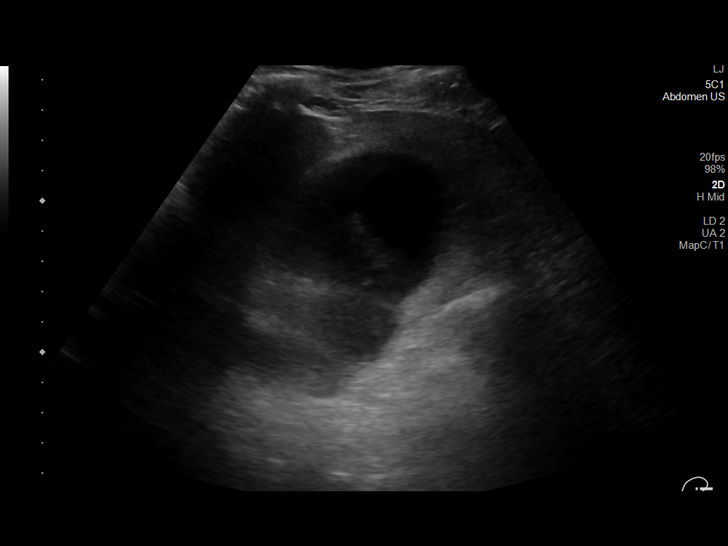
[im 10/55]
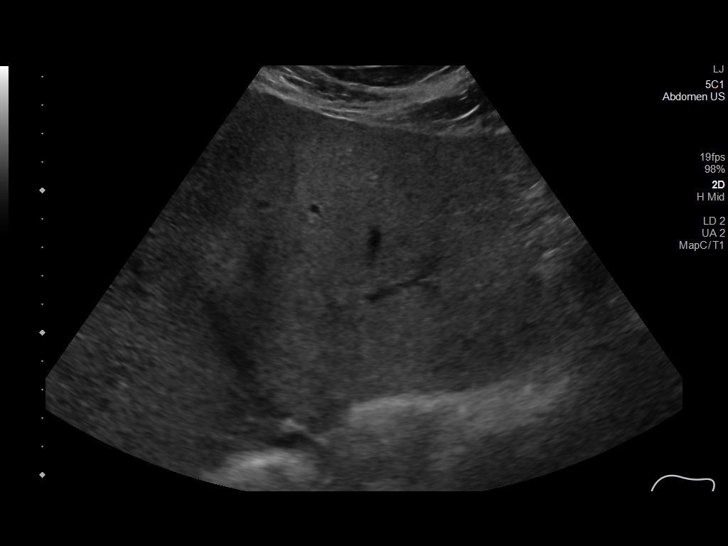
[im 14/55]
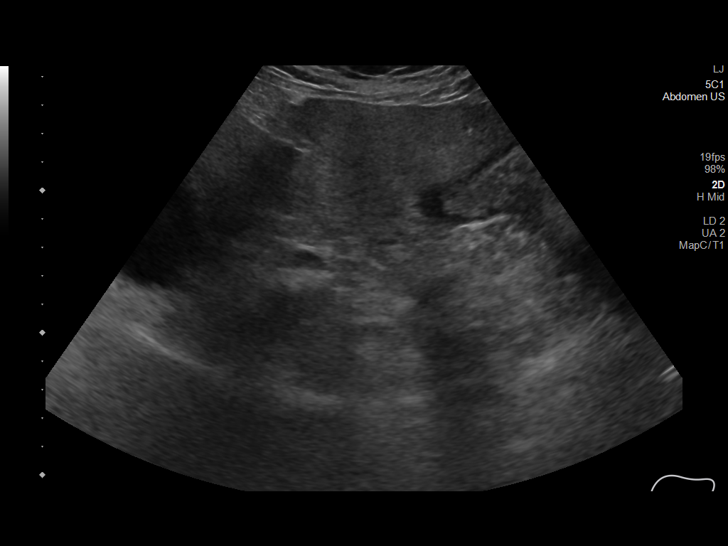
[im 19/55]
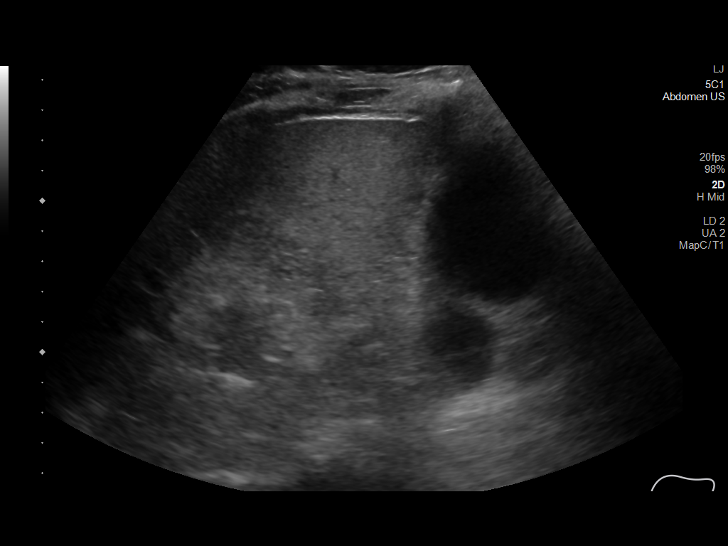
[im 21/55]
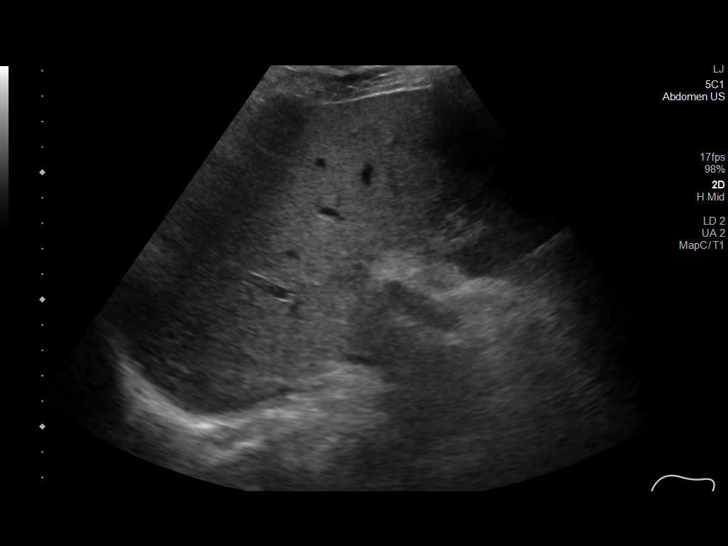
[im 25/55]
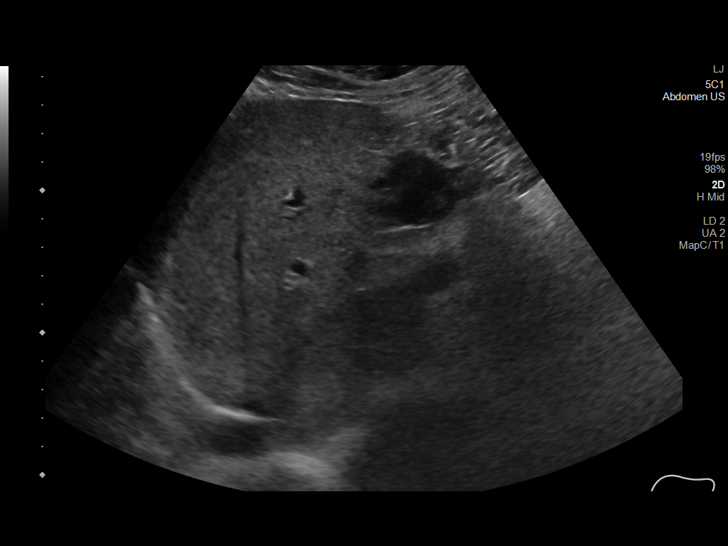
[im 30/55]
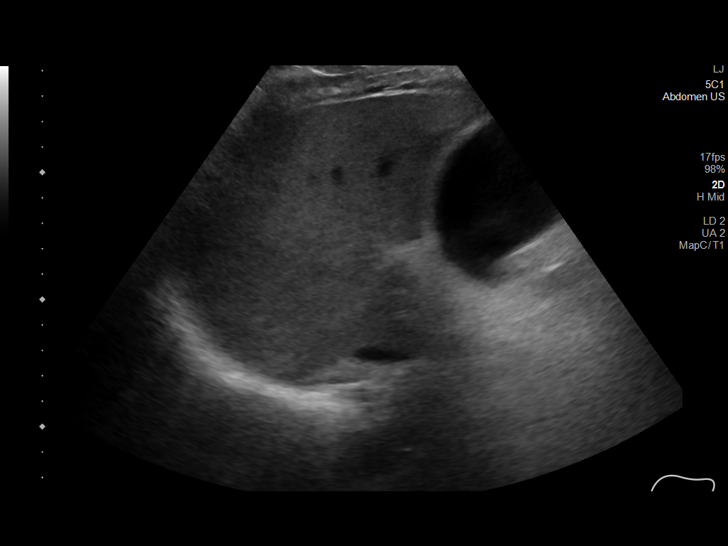
[im 34/55]
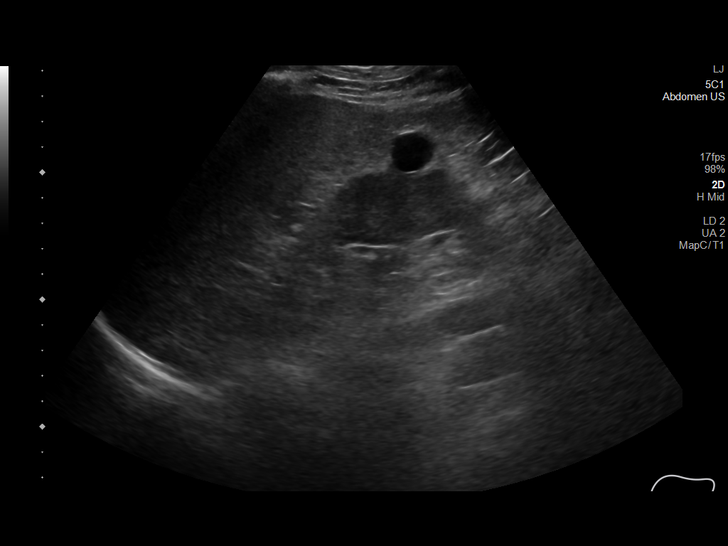
[im 37/55]
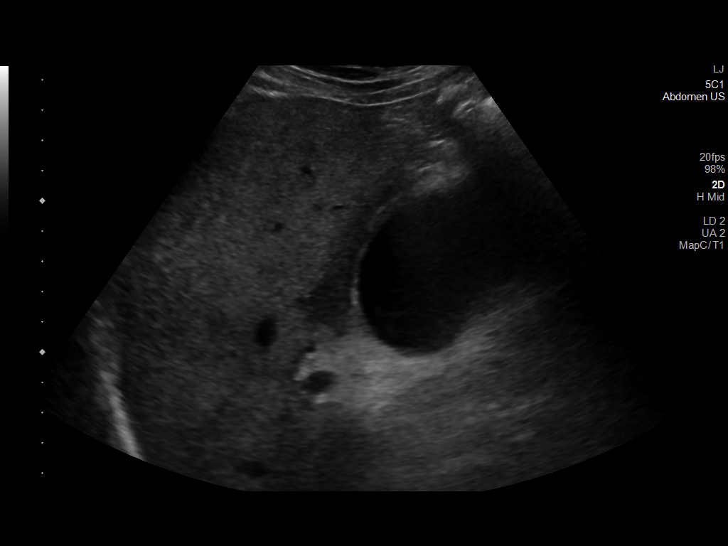
[im 41/55]
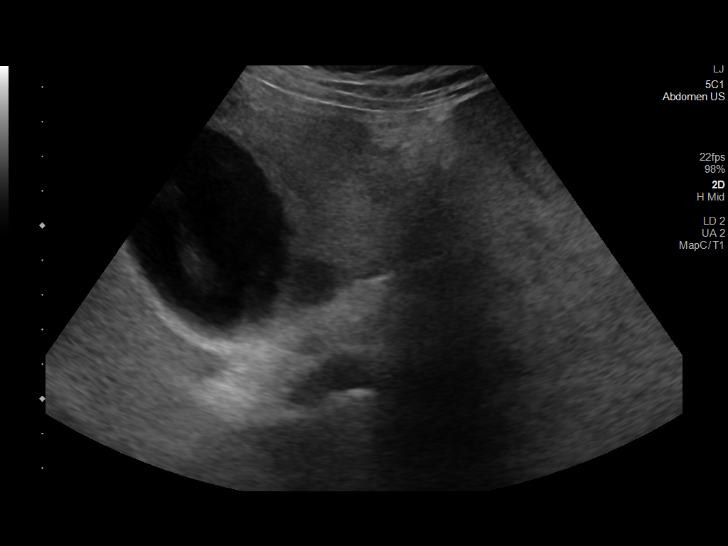
[im 46/55]
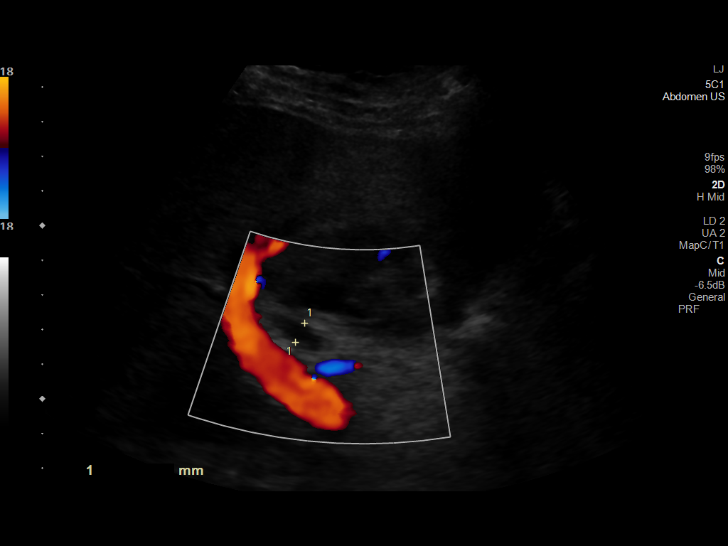
[im 50/55]
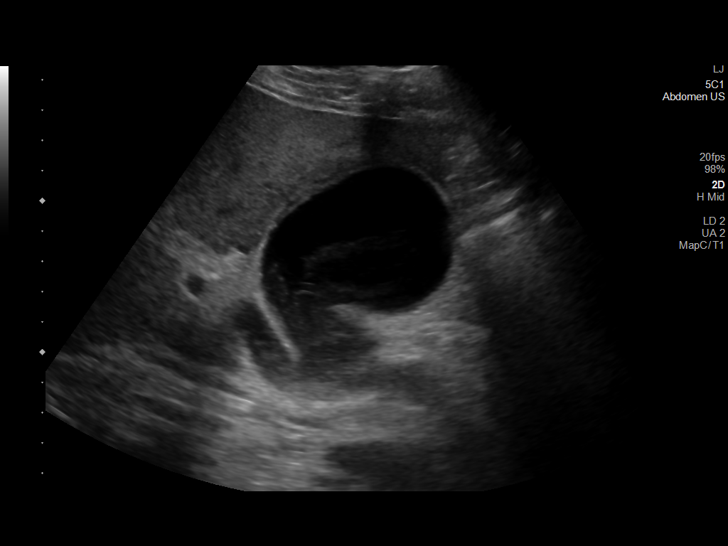
[im 55/55]
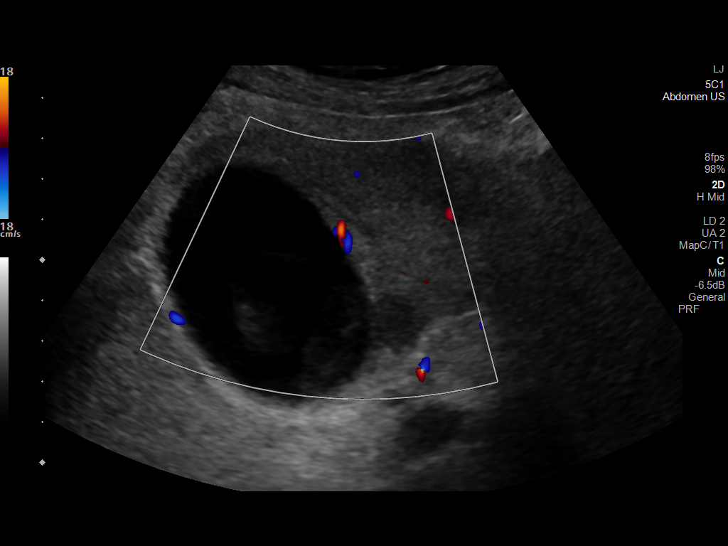

[14 of 25 positions shown; findings below may reference images not displayed]

FINDINGS: Gallbladder:

Gallbladder is well distended with sludge within. No pericholecystic
fluid is noted. Mild wall thickening is noted to 5 mm. No
cholelithiasis is seen.

Common bile duct:

Diameter: 6.2 mm.

Liver:

Mild fatty infiltration of the liver is noted. Portal vein is patent
on color Doppler imaging with normal direction of blood flow towards
the liver.

Other: None.
IMPRESSION: Gallbladder sludge without evidence of cholelithiasis. Some wall
thickening is noted. These changes could represent some chronic
cholecystitis. HIDA scan may be helpful in this regard.

## 2021-01-22 IMAGING — CT CT CHEST-ABD-PELV W/ CM
2 of 5 series · 14 of 36 positions shown, 16 images · IV contrast (omnipaque)
Comparison: Plain film chest of 1 day prior. LEIK 517 CTA chest.
No prior abdominopelvic CTs.

CLINICAL DATA: Chest pain or shortness of breath. Sepsis. Effusion
suspected.

EXAM:
CT CHEST, ABDOMEN, AND PELVIS WITH CONTRAST
TECHNIQUE: Multidetector CT imaging of the chest, abdomen and pelvis was
performed following the standard protocol during bolus
administration of intravenous contrast.
CONTRAST:  100mL OMNIPAQUE IOHEXOL 300 MG/ML  SOLN

[Series 3: cap 5.0 i31f 2 · axial · 0.93mm/px · z∈[+802,+1362]mm · 11 of 136 slices shown, 13 images]
[im 12/136  mediastinal]
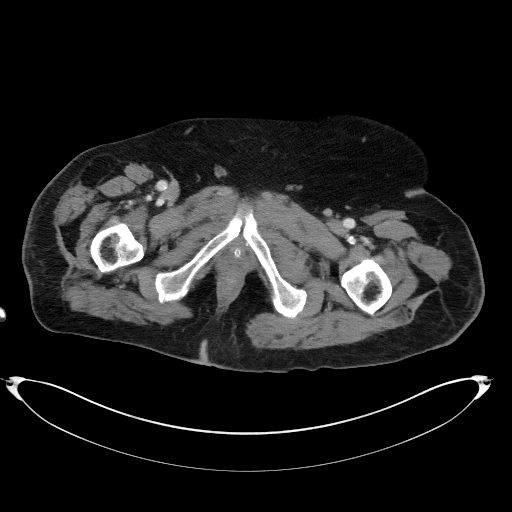
[im 12/136  bone]
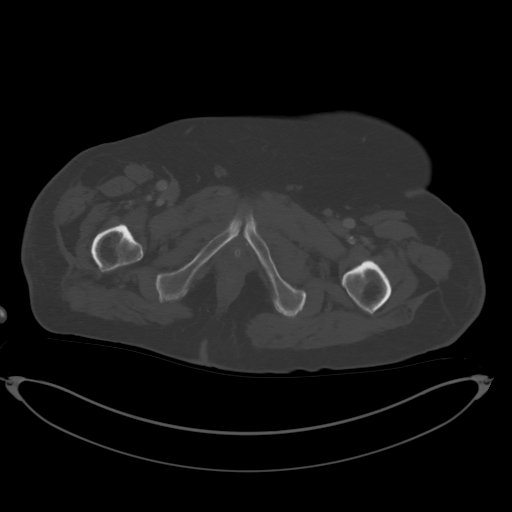
[im 23/136  mediastinal]
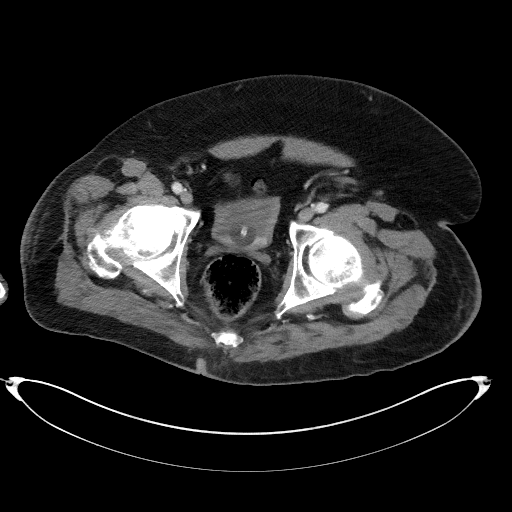
[im 34/136  mediastinal]
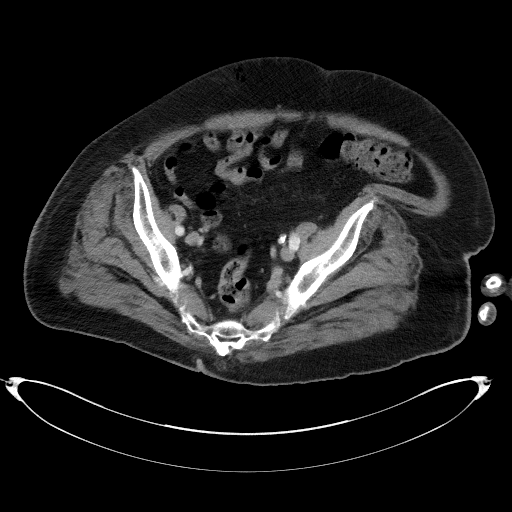
[im 46/136  mediastinal]
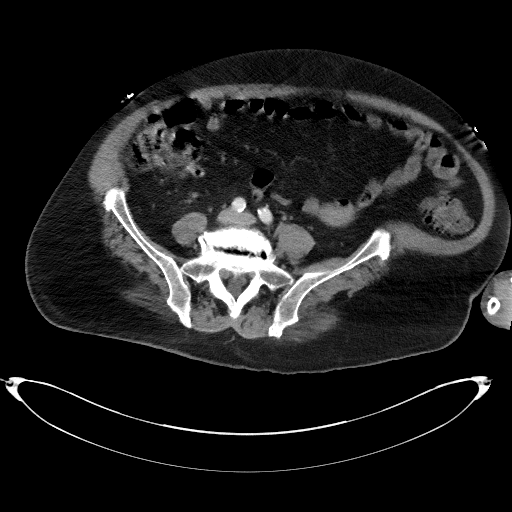
[im 57/136  mediastinal]
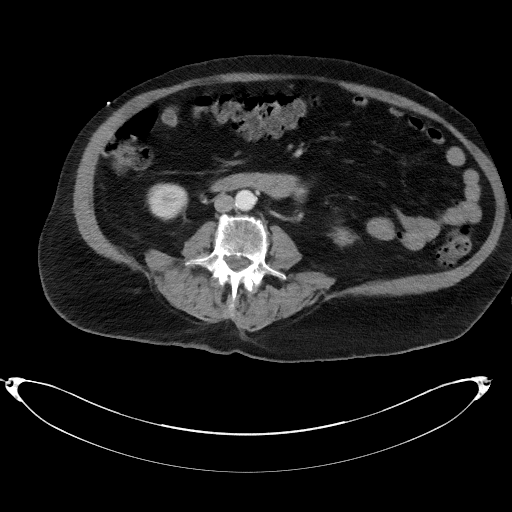
[im 68/136  mediastinal]
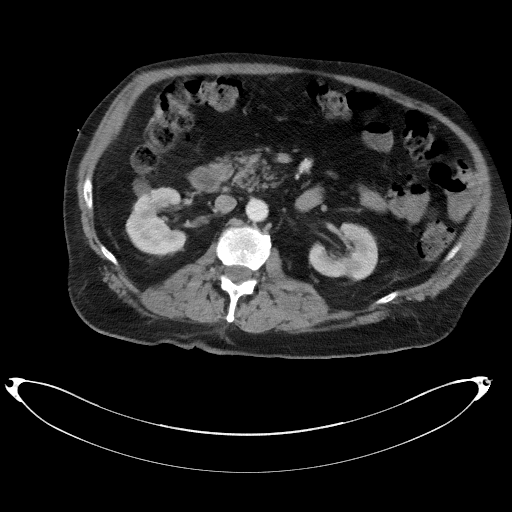
[im 79/136  mediastinal]
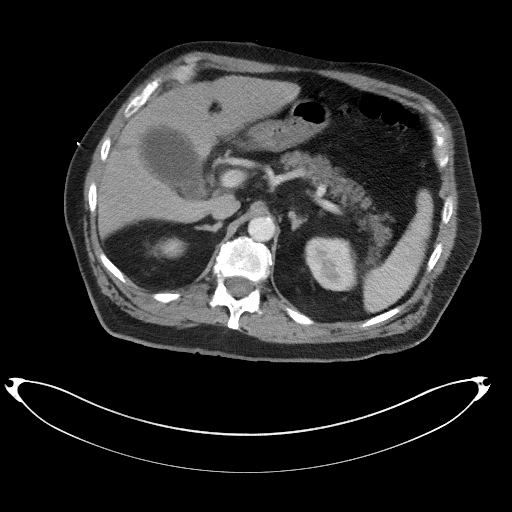
[im 91/136  mediastinal]
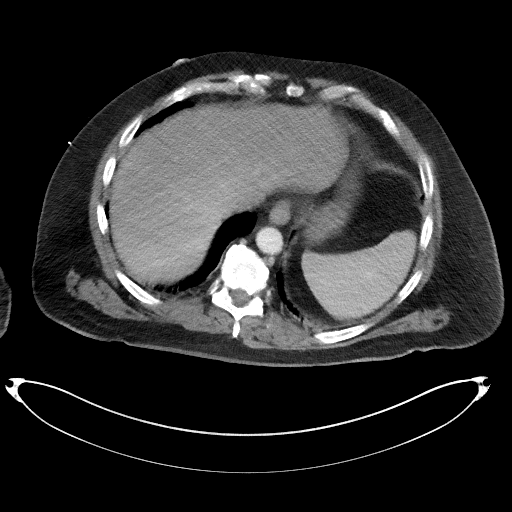
[im 102/136  mediastinal]
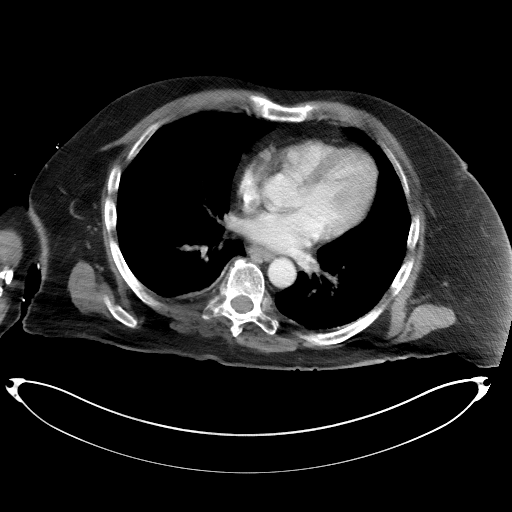
[im 102/136  bone]
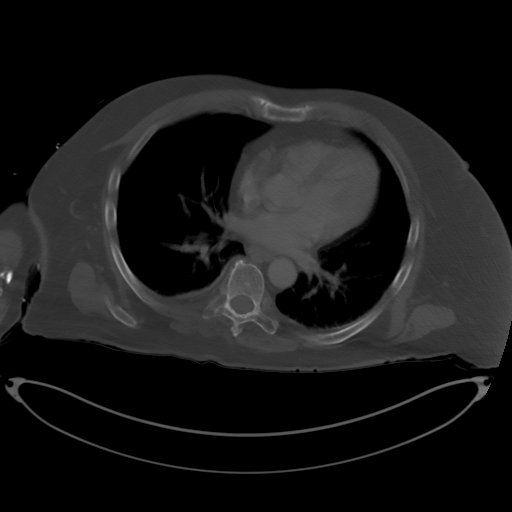
[im 113/136  mediastinal]
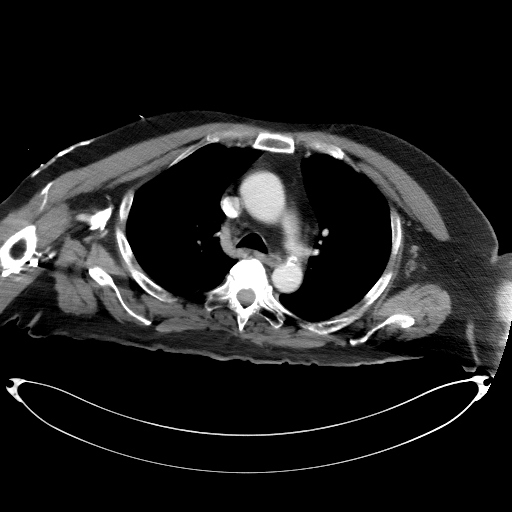
[im 124/136  mediastinal]
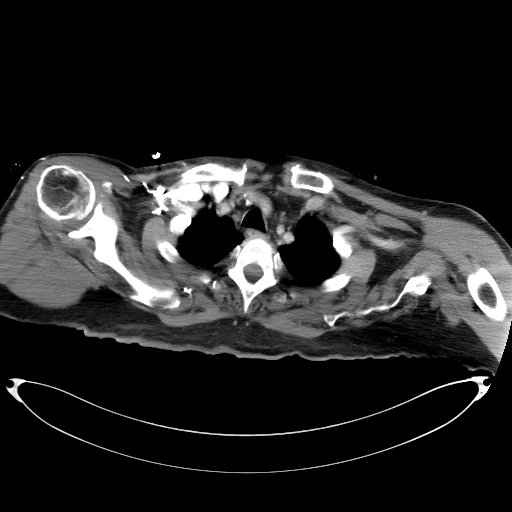

[Series 6: coronal · coronal · 0.99mm/px · 3 of 157 slices shown]
[im 32/157  mediastinal]
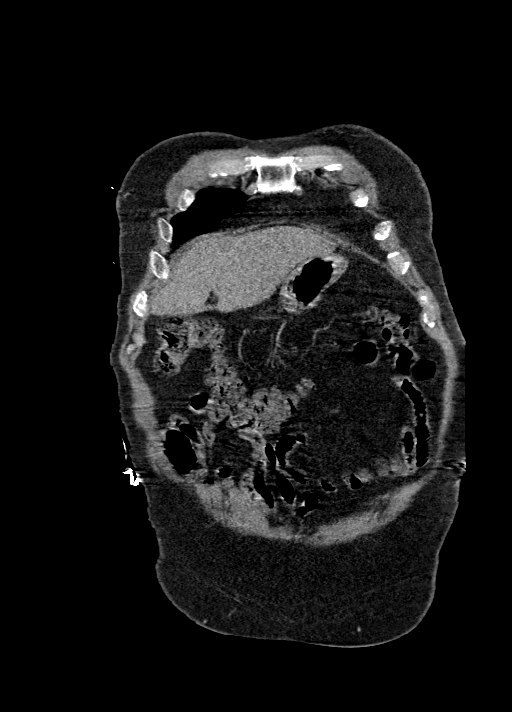
[im 63/157  mediastinal]
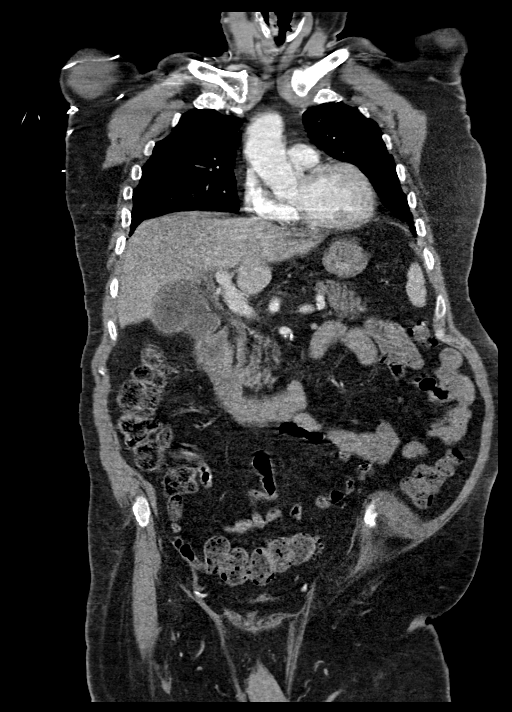
[im 94/157  mediastinal]
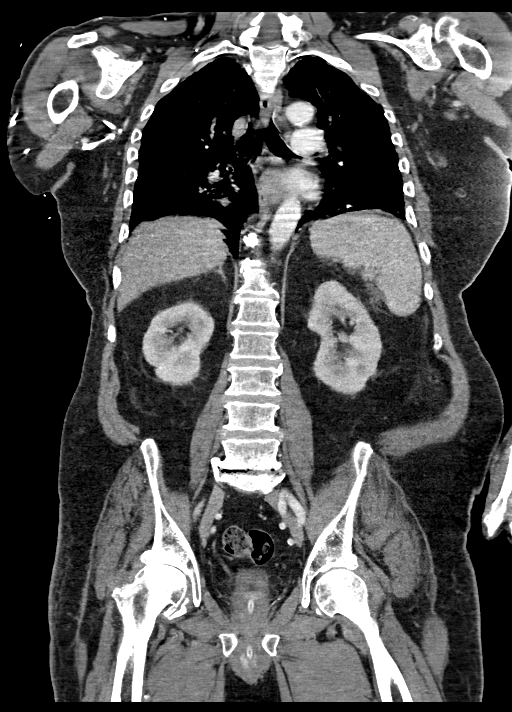

[14 of 36 positions shown; findings below may reference images not displayed]

FINDINGS: CT CHEST FINDINGS

Cardiovascular: Mild motion degradation throughout. Exam also
degraded by patient arm position, not raised above the head.

Right subclavian artery stent or calcification. Aortic
atherosclerosis. Mild cardiomegaly, without pericardial effusion.
Multivessel coronary artery atherosclerosis. No central pulmonary
embolism, on this non-dedicated study.

Mediastinum/Nodes: No mediastinal or hilar adenopathy.

Lungs/Pleura: No pleural fluid.  Mild centrilobular emphysema.

Musculoskeletal: No acute osseous abnormality.

CT ABDOMEN PELVIS FINDINGS

Hepatobiliary: Normal liver. The gallbladder is mildly distended
with suggestion of pericholecystic edema, including on 6[REDACTED]. No calcified stone or biliary duct dilatation.

Pancreas: Normal, without mass or ductal dilatation.

Spleen: Normal in size, without focal abnormality.

Adrenals/Urinary Tract: Mild left adrenal thickening. Normal right
adrenal gland. Interpolar right renal 1.8 cm lesion measures on the
order of 19 HU, favoring a minimally complex cyst. Inter/upper pole
punctate right renal collecting system calculus. No hydronephrosis.
Foley catheter within the urinary bladder.

Stomach/Bowel: Normal stomach, without wall thickening. Moderate
stool within the rectum, less so the remainder of the colon. Normal
terminal ileum and appendix. Normal small bowel.

Vascular/Lymphatic: Aortic atherosclerosis. No abdominopelvic
adenopathy.

Reproductive: Normal prostate.

Other: No significant free fluid. No free intraperitoneal air. Right
paramidline fat containing ventral pelvic wall hernia. Presumably
iatrogenic subcutaneous air within the anterior pelvic wall.

Musculoskeletal: Right hip osteoarthritis. Degenerative disc disease
at the lumbosacral junction, presuming a transitional S1 vertebral
body.
IMPRESSION: 1. Multifactorial degradation, including motion and patient arm
position.
2. Mildly distended gallbladder with suggestion of pericholecystic
edema. Correlate with right upper quadrant symptoms and consider
ultrasound.
3. Right nephrolithiasis.
4. Possible constipation or even fecal impaction.
5. Aortic Atherosclerosis ([HY]-[HY]) and Emphysema ([HY]-[HY]).
Coronary artery atherosclerosis.

## 2021-01-22 MED ORDER — METOPROLOL TARTRATE 5 MG/5ML IV SOLN
10.0000 mg | INTRAVENOUS | Status: AC
  Administered 2021-01-22: 10 mg via INTRAVENOUS
  Filled 2021-01-22: qty 10

## 2021-01-22 MED ORDER — LACTATED RINGERS IV SOLN: INTRAVENOUS | Status: DC

## 2021-01-22 MED ORDER — IOHEXOL 300 MG/ML  SOLN
100.0000 mL | Freq: Once | INTRAMUSCULAR | Status: AC | PRN
  Administered 2021-01-22: 100 mL via INTRAVENOUS

## 2021-01-22 MED ORDER — SODIUM CHLORIDE 0.9 % IV SOLN
1.0000 g | Freq: Three times a day (TID) | INTRAVENOUS | Status: AC
Start: 2021-01-22 — End: 2021-01-24
  Administered 2021-01-22 – 2021-01-24 (×6): 1 g via INTRAVENOUS
  Filled 2021-01-22 (×6): qty 1

## 2021-01-22 MED ORDER — LORAZEPAM 2 MG/ML IJ SOLN
1.0000 mg | Freq: Four times a day (QID) | INTRAMUSCULAR | Status: AC | PRN
  Administered 2021-01-22 – 2021-01-26 (×3): 1 mg via INTRAVENOUS
  Filled 2021-01-22 (×3): qty 1

## 2021-01-22 NOTE — Progress Notes (Signed)
VASCULAR LAB    Bilateral lower extremity venous duplex has been performed.  See CV proc for preliminary results.   Norinne Jeane, RVT 01/22/2021, 2:22 PM

## 2021-01-22 NOTE — Progress Notes (Signed)
MD notified about the high BP and HR following PRN dose. MD gave RN verbal orders for sustained high HR and elevated BP. RN will continue to monitor.

## 2021-01-22 NOTE — Progress Notes (Addendum)
PROGRESS NOTE        PATIENT DETAILS Name: Vincent [name] Age: 70 y.o. Sex: male Date of Birth: 06-03-1951 Admit Date: 01/12/2021 Admitting Physician Lequita Halt, MD SEG:BTDV, Emily Filbert., MD  Brief Narrative: Patient is a 70 y.o. male with history of insulin-dependent DM-2, HTN, CAD, chronic systolic heart failure, chronic pain syndrome on narcotics/benzos-presented with a 2-week history of worsening confusion, visual hallucinations and frequent falls.  Spouse reports slurred speech for a few days as well.  Patient was subsequently admitted to the hospitalist service for further evaluation and treatment.  Significant events: 4/7>> admit for evaluation of slurred speech, confusion, hallucination, frequent falls  Significant studies: 4/7>> CT head: Negative for acute abnormalities 4/7>> CT C-spine: No fracture 4/7>> x-ray right knee: Right knee replacement without hardware loosening or fracture 4/8>> Echo: EF 45-50%, apical akinesis, grade 1 diastolic dysfunction.  Swirling of contrast at apex consistent with low flow but no thrombus, +ve WMA. 4/8>> vitamin B12: 72 4/10 >> MRI Brain and C Spine - Small subacute infarct in the right parietal white matter, likely a current manifestation of the patient's extensive chronic small vessel ischemia, Moderate right neuroforaminal stenosis at C3-C4.  4/17 >> CT Chest - Abd-Pelvis with IV contrast  4/17 - Leg [place]  Antimicrobial therapy: None  Microbiology data: None  Procedures : None  Consults: Neurology x 2  DVT Prophylaxis : enoxaparin (LOVENOX) injection 40 mg Start: 01/13/21 1000   Subjective: Patient in bed, in no distress but more sleepy today, denies any headache or chest pain.   Assessment/Plan:  Encephalopathy/slurred speech/visual hallucinations: Probable polypharmacy-but need to rule out CVA due to slurred speech-echo findings (smoke seen)-vitamin B12 deficiency may be contributing as  well-continue to hold benzos/narcotics-increased Seroquel to 50 mg twice daily-use Haldol as needed.  Clinically improving, MRI brain noted and DW Neuro 01/15/21, neurological changes likely due to prolonged B12 deficiency, according to the wife symptoms have been gradually progressive over several months. Neurology following, SNF vs CIR. Will drop Seroquel dose on 01/21/2021 as he appears to be slightly more drowsy morning of 01/21/2021.  Incidental small right parietal stroke noted.  He is on aspirin and statin, no deficits contributed to this, continue to monitor.  May require placement.  LDL under 70, A1c mildly elevated.  Vitamin B12 deficiency: On B12 supplementation-recheck vitamin B12 levels in 3 months.  Mild leukocytosis with low-grade fevers and tachycardia due to aspiration pneumonia in the hospital and Ur. Retention.   Chest x-ray positive sided infiltrate, clinically also suspicious aspiration pneumonia, placed on appropriate antibiotics on 01/17/21 based on his allergy profile.  Continue soft diet with feeding assistance and aspiration precautions, speech following.  He continues to be persistently tachycardic, despite trial of biotics and Foley, will proceed with CT chest abdomen pelvis, Leg [place] to rule out any infectious source or clots.  Urinary retention.  Foley and Flomax 01/21/21 - UA borderline, he was already on meropenem will extend course for 2 more days to stop on 01/25/2017.  Combined chronic systolic and diastolic heart failure EF around 50%: Euvolemic-since slightly dehydrated on 01/15/2021 hold further diuretics and hydrate.  CAD-s/p PCI to LAD in 2007: No anginal symptoms-has EF 45% with apical akinesis on echo-most recent nuclear stress test in 2017 showed a fixed defect in the apex stable for outpatient follow-up with primary cardiologist.  Remains  on aspirin. Will need outpt Cards follow up in 7-10 days post DC.  HTN: Blood pressure controlled-continue present regimen.   Holding ACE inhibitor due to AKI.  HLD: Continue statin-LDL 60.  Mild clinical dehydration with hyponatremia and AKI.  Hydrate with IV fluids and monitor.    DM-2 (A1c 7.2 on 4/7): CBG stable with SSI-hold all oral hypoglycemic agents  Recent Labs    01/22/21 0407 01/22/21 0639 01/22/21 0824  GLUCAP 171* 192* 152*    Obesity: Estimated body mass index is 31.61 kg/m as calculated from the following:   Height as of this encounter: 5\' 8"  (1.727 m).   Weight as of this encounter: 94.3 kg.   Diet: Diet Order            DIET DYS 2 Room service appropriate? Yes; Fluid consistency: Thin  Diet effective now                  Code Status: Full code   Family Communication:  [phone] updated on 01/14/21, message left at 10 AM on 01/15/2021, 01/16/21 updated, updated bedside 01/17/21  Disposition Plan: Status is: Inpatient  Remains inpatient appropriate because:Inpatient level of care appropriate due to severity of illness   Dispo: The patient is from: Home              Anticipated d/c is to: Home              Patient currently is not medically stable to d/c.   Difficult to place patient No   Barriers to Discharge: Ongoing altered mental status-awaiting further work-up-see above  Antimicrobial agents: Anti-infectives (From admission, onward)   Start     Dose/Rate Route Frequency Ordered Stop   01/19/21 2200  doxycycline (VIBRA-TABS) tablet 100 mg        100 mg Oral Every 12 hours 01/19/21 0903 01/22/21 0759   01/17/21 1830  doxycycline (VIBRAMYCIN) 100 mg in sodium chloride 0.9 % 250 mL IVPB  Status:  Discontinued        100 mg 125 mL/hr over 120 Minutes Intravenous Every 12 hours 01/17/21 1730 01/19/21 0903   01/17/21 1830  meropenem (MERREM) 1 g in sodium chloride 0.9 % 100 mL IVPB        1 g 200 mL/hr over 30 Minutes Intravenous Every 8 hours 01/17/21 1757 01/21/21 1842       Time spent:  35 minutes-Greater than 50% of this time was spent in  counseling, explanation of diagnosis, planning of further management, and coordination of care.  MEDICATIONS:  Scheduled Meds: .  stroke: mapping our early stages of recovery book   Does not apply Once  . amLODipine  10 mg Oral Daily  . aspirin EC  81 mg Oral Daily  . bisacodyl  10 mg Rectal Daily  . Chlorhexidine Gluconate Cloth  6 each Topical Daily  . cyanocobalamin  1,000 mcg Subcutaneous Q30 days  . docusate sodium  200 mg Oral BID  . enoxaparin (LOVENOX) injection  40 mg Subcutaneous Q24H  . insulin aspart  0-15 Units Subcutaneous TID WC  . isosorbide mononitrate  30 mg Oral Daily  . metoprolol tartrate  100 mg Oral BID  . multivitamin with minerals  1 tablet Oral Daily  . polyethylene glycol  17 g Oral BID  . QUEtiapine  25 mg Oral BID  . simvastatin  40 mg Oral QHS  . tamsulosin  0.4 mg Oral Daily  . thiamine  100 mg Oral Daily  Continuous Infusions: . lactated ringers 75 mL/hr at 01/22/21 0314   PRN Meds:.acetaminophen **OR** [DISCONTINUED] acetaminophen (TYLENOL) oral liquid 160 mg/5 mL **OR** [DISCONTINUED] acetaminophen, haloperidol lactate, hydrALAZINE, metoprolol tartrate, senna-docusate   PHYSICAL EXAM: Vital signs: Vitals:   01/22/21 0332 01/22/21 0632 01/22/21 0637 01/22/21 0700  BP: 137/71   135/81  Pulse: 89 (!) 119 (!) 118 (!) 112  Resp:      Temp: 97.8 F (36.6 C)   100.2 F (37.9 C)  TempSrc: Oral   Axillary  SpO2: 95%   98%  Weight:      Height:       Filed Weights   01/17/21 1749  Weight: 94.3 kg   Body mass index is 31.61 kg/m.   Gen Exam:   Drowsy this am, remains extremely weak in both lower extremities, speech is more comprehensible but still dysarthric, foley in place [place].AT,PERRAL Supple Neck,No JVD, No cervical lymphadenopathy appriciated.  Symmetrical Chest wall movement, Good air movement bilaterally, CTAB RRR,No Gallops, Rubs or new Murmurs, No Parasternal Heave +ve B.Sounds, Abd Soft, No tenderness, No organomegaly  appriciated, No rebound - guarding or rigidity. No Cyanosis, Clubbing or edema, No new Rash or bruise    I have personally reviewed following labs and imaging studies  LABORATORY DATA:  Recent Labs  Lab 01/18/21 0606 01/19/21 0254 01/20/21 0239 01/21/21 0218 01/22/21 0115  WBC 11.9* 11.7* 11.9* 11.9* 10.6*  HGB 15.0 14.8 14.7 14.0 13.5  HCT 44.2 43.2 42.1 40.5 40.5  PLT 204 234 219 228 216  MCV 90.6 89.1 88.6 88.0 91.6  MCH 30.7 30.5 30.9 30.4 30.5  MCHC 33.9 34.3 34.9 34.6 33.3  RDW 13.4 13.6 13.3 13.4 13.2  LYMPHSABS 1.0 1.5 1.5 2.0 1.3  MONOABS 1.3* 1.6* 1.4* 1.6* 1.1*  EOSABS 0.0 0.0 0.0 0.1 0.1  BASOSABS 0.0 0.0 0.0 0.0 0.0    Recent Labs  Lab 01/18/21 0606 01/19/21 0254 01/19/21 0529 01/19/21 0707 01/20/21 0239 01/21/21 0218 01/22/21 0115  NA 135 134*  --   --  131* 134* 132*  K 4.2 5.7*  --  4.0 4.0 3.8 4.2  CL 105 105  --   --  105 102 101  CO2 14* 17*  --   --  18* 22 21*  GLUCOSE 164* 170*  --   --  176* 158* 185*  BUN 14 20  --   --  18 15 17   CREATININE 0.96 0.94  --   --  0.82 0.75 0.67  CALCIUM 9.5 9.6  --   --  9.5 9.5 9.2  AST 15 26  --   --  31 30 39  ALT 11 8  --   --  14 18 22   ALKPHOS 103 98  --   --  95 93 89  BILITOT 2.7* 2.9*  --   --  1.7* 1.3* 1.3*  ALBUMIN 3.3* 3.2*  --   --  2.9* 2.7* 2.5*  MG 2.1 2.4  --   --  2.1 2.1 2.0  PROCALCITON <0.10  --  <0.10  --  <0.10 <0.10 <0.10  BNP 35.2 34.8  --   --  27.6 42.5 30.6    Recent Labs  Lab 01/18/21 0606 01/19/21 0254 01/19/21 0529 01/20/21 0239 01/21/21 0218 01/22/21 0115  WBC 11.9* 11.7*  --  11.9* 11.9* 10.6*  HGB 15.0 14.8  --  14.7 14.0 13.5  HCT 44.2 43.2  --  42.1 40.5 40.5  PLT 204 234  --  219 228 216  BNP 35.2 34.8  --  27.6 42.5 30.6  PROCALCITON <0.10  --  <0.10 <0.10 <0.10 <0.10  AST 15 26  --  31 30 39  ALT 11 8  --  14 18 22   ALKPHOS 103 98  --  95 93 89  BILITOT 2.7* 2.9*  --  1.7* 1.3* 1.3*  ALBUMIN 3.3* 3.2*  --  2.9* 2.7* 2.5*      Urine analysis:     Component Value Date/Time   COLORURINE AMBER (A) 01/21/2021 1323   APPEARANCEUR CLEAR 01/21/2021 1323   LABSPEC 1.030 01/21/2021 1323   PHURINE 5.0 01/21/2021 1323   GLUCOSEU >=500 (A) 01/21/2021 1323   HGBUR SMALL (A) 01/21/2021 1323   BILIRUBINUR NEGATIVE 01/21/2021 1323   KETONESUR 80 (A) 01/21/2021 1323   PROTEINUR 30 (A) 01/21/2021 1323   UROBILINOGEN 0.2 01/06/2014 1257   NITRITE NEGATIVE 01/21/2021 1323   LEUKOCYTESUR NEGATIVE 01/21/2021 1323    Sepsis Labs: Lactic Acid, Venous No results found for: LATICACIDVEN  MICROBIOLOGY: Recent Results (from the past 240 hour(s))  Resp Panel by RT-PCR (Flu A&B, Covid) Nasopharyngeal Swab     Status: None   Collection Time: 01/12/21  4:19 PM   Specimen: Nasopharyngeal Swab; Nasopharyngeal(NP) swabs in vial transport medium  Result Value Ref Range Status   SARS Coronavirus 2 by RT PCR NEGATIVE NEGATIVE Final    Comment: (NOTE) SARS-CoV-2 target nucleic acids are NOT DETECTED.  The SARS-CoV-2 RNA is generally detectable in upper respiratory specimens during the acute phase of infection. The lowest concentration of SARS-CoV-2 viral copies this assay can detect is 138 copies/mL. A negative result does not preclude SARS-Cov-2 infection and should not be used as the sole basis for treatment or other patient management decisions. A negative result may occur with  improper specimen collection/handling, submission of specimen other than nasopharyngeal swab, presence of viral mutation(s) within the areas targeted by this assay, and inadequate number of viral copies(<138 copies/mL). A negative result must be combined with clinical observations, patient history, and epidemiological information. The expected result is Negative.  Fact Sheet for Patients:  EntrepreneurPulse.com.au  Fact Sheet for Healthcare Providers:  IncredibleEmployment.be  This test is no t yet approved or cleared by the [place] FDA and  has been authorized for detection and/or diagnosis of SARS-CoV-2 by FDA under an Emergency Use Authorization (EUA). This EUA will remain  in effect (meaning this test can be used) for the duration of the COVID-19 declaration under Section 564(b)(1) of the Act, 21 U.S.C.section 360bbb-3(b)(1), unless the authorization is terminated  or revoked sooner.       Influenza A by PCR NEGATIVE NEGATIVE Final   Influenza B by PCR NEGATIVE NEGATIVE Final    Comment: (NOTE) The Xpert Xpress SARS-CoV-2/FLU/RSV plus assay is intended as an aid in the diagnosis of influenza from Nasopharyngeal swab specimens and should not be used as a sole basis for treatment. Nasal washings and aspirates are unacceptable for Xpert Xpress SARS-CoV-2/FLU/RSV testing.  Fact Sheet for Patients: EntrepreneurPulse.com.au  Fact Sheet for Healthcare Providers: IncredibleEmployment.be  This test is not yet approved or cleared by the [place] FDA and has been authorized for detection and/or diagnosis of SARS-CoV-2 by FDA under an Emergency Use Authorization (EUA). This EUA will remain in effect (meaning this test can be used) for the duration of the COVID-19 declaration under Section 564(b)(1) of the Act, 21 U.S.C. section 360bbb-3(b)(1), unless the authorization is terminated or revoked.  Performed at  [facility], [place] [address], [place], [place] [address]   Culture, Urine     Status: Abnormal   Collection Time: 01/17/21  6:16 PM   Specimen: Urine, Random  Result Value Ref Range Status   Specimen Description URINE, RANDOM  Final   Special Requests   Final    NONE Performed at [facility], [place] [address], [place], [place] [address]    Culture (A)  Final    >=100,000 COLONIES/mL MULTIPLE SPECIES PRESENT, SUGGEST RECOLLECTION   Report Status 01/19/2021 FINAL  Final  Culture, blood (routine x 2)     Status: None (Preliminary result)    Collection Time: 01/17/21  7:39 PM   Specimen: BLOOD RIGHT HAND  Result Value Ref Range Status   Specimen Description BLOOD RIGHT HAND  Final   Special Requests   Final    BOTTLES DRAWN AEROBIC AND ANAEROBIC Blood Culture adequate volume   Culture   Final    NO GROWTH 4 DAYS Performed at [facility], [place] [address] [place], [place] [address]    Report Status PENDING  Incomplete  Culture, blood (routine x 2)     Status: None (Preliminary result)   Collection Time: 01/17/21  7:42 PM   Specimen: BLOOD  Result Value Ref Range Status   Specimen Description BLOOD RIGHT ANTECUBITAL  Final   Special Requests   Final    BOTTLES DRAWN AEROBIC AND ANAEROBIC Blood Culture adequate volume   Culture   Final    NO GROWTH 4 DAYS Performed at [facility], [place] [address], [place], [place] [address]    Report Status PENDING  Incomplete    RADIOLOGY STUDIES/RESULTS: DG Chest Port 1 View  Result Date: 01/21/2021 CLINICAL DATA:  Shortness of breath. EXAM: PORTABLE CHEST 1 VIEW COMPARISON:  January 20, 2021 FINDINGS: The heart size and mediastinal contours are within normal limits. Mild linear atelectasis is identified in the left lung base. There is no focal pneumonia, pulmonary edema or pleural effusion. The visualized skeletal structures are stable. IMPRESSION: Mild linear atelectasis in the left lung base.  No focal pneumonia. Electronically Signed   By: Abelardo Diesel M.D.   On: 01/21/2021 08:44     LOS: 10 days   Lala Lund, MD  [facility]  01/22/2021, 9:29 AM

## 2021-01-22 NOTE — Progress Notes (Signed)
Inpatient Rehab Admissions Coordinator:   Second CIR consult received. Pt. More interactive today per MD note. I signed off on Pt. Yesterday because  most recent therapy notes recommend SNF and Pt.'s wife stated yesterday that she wants Pt. To go to [facility] for short term rehab. I will follow for updated therapy notes and re-initiate conversation with Pt.'s family regarding CIR vs. SNF if PT/OT feel that Pt. Could tolerate and would benefit from CIR level therapies. Please contact me with any questions.   Clemens Catholic, [place], [place] Admissions Coordinator  [phone] ([place]) [phone] (office)

## 2021-01-22 NOTE — Progress Notes (Signed)
Pt noted to be more cooperative to interventions and less agitated this shift w/ an increase in orientation.

## 2021-01-22 NOTE — Plan of Care (Signed)
  Problem: Education: Goal: Knowledge of disease or condition will improve Outcome: Progressing Goal: Knowledge of secondary prevention will improve Outcome: Progressing Goal: Knowledge of patient specific risk factors addressed and post discharge goals established will improve Outcome: Progressing Goal: Individualized Educational Video(s) Outcome: Progressing   Problem: Coping: Goal: Will verbalize positive feelings about self Outcome: Progressing Goal: Will identify appropriate support needs Outcome: Progressing   Problem: Health Behavior/Discharge Planning: Goal: Ability to manage health-related needs will improve Outcome: Progressing   Problem: Self-Care: Goal: Ability to participate in self-care as condition permits will improve Outcome: Progressing Goal: Verbalization of feelings and concerns over difficulty with self-care will improve Outcome: Progressing Goal: Ability to communicate needs accurately will improve Outcome: Progressing   Problem: Nutrition: Goal: Risk of aspiration will decrease Outcome: Progressing Goal: Dietary intake will improve Outcome: Progressing   Problem: Ischemic Stroke/TIA Tissue Perfusion: Goal: Complications of ischemic stroke/TIA will be minimized Outcome: Progressing   Problem: Safety: Goal: Non-violent Restraint(s) Outcome: Progressing

## 2021-01-23 ENCOUNTER — Inpatient Hospital Stay (HOSPITAL_COMMUNITY): Payer: HMO

## 2021-01-23 DIAGNOSIS — R4182 Altered mental status, unspecified: Secondary | ICD-10-CM

## 2021-01-23 DIAGNOSIS — I10 Essential (primary) hypertension: Secondary | ICD-10-CM | POA: Diagnosis not present

## 2021-01-23 LAB — COMPREHENSIVE METABOLIC PANEL
ALT: 19 U/L (ref 0–44)
AST: 25 U/L (ref 15–41)
Albumin: 2.2 g/dL — ABNORMAL LOW (ref 3.5–5.0)
Alkaline Phosphatase: 89 U/L (ref 38–126)
Anion gap: 10 (ref 5–15)
BUN: 11 mg/dL (ref 8–23)
CO2: 22 mmol/L (ref 22–32)
Calcium: 9 mg/dL (ref 8.9–10.3)
Chloride: 99 mmol/L (ref 98–111)
Creatinine, Ser: 0.68 mg/dL (ref 0.61–1.24)
GFR, Estimated: >60 mL/min (ref >60)
Glucose, Bld: 156 mg/dL — ABNORMAL HIGH (ref 70–99)
Potassium: 3.9 mmol/L (ref 3.5–5.1)
Sodium: 131 mmol/L — ABNORMAL LOW (ref 135–145)
Total Bilirubin: 1.3 mg/dL — ABNORMAL HIGH (ref 0.3–1.2)
Total Protein: 5.6 g/dL — ABNORMAL LOW (ref 6.5–8.1)

## 2021-01-23 LAB — URINALYSIS, ROUTINE W REFLEX MICROSCOPIC
Bilirubin Urine: NEGATIVE
Glucose, UA: >=500 mg/dL — AB
Ketones, ur: 80 mg/dL — AB
Nitrite: NEGATIVE
Protein, ur: 30 mg/dL — AB
Specific Gravity, Urine: 1.029 (ref 1.005–1.030)
pH: 5 (ref 5.0–8.0)

## 2021-01-23 LAB — SODIUM, URINE, RANDOM: Sodium, Ur: 52 mmol/L

## 2021-01-23 LAB — CBC WITH DIFFERENTIAL/PLATELET
Abs Immature Granulocytes: 0.08 10*3/uL — ABNORMAL HIGH (ref 0.00–0.07)
Basophils Absolute: 0 10*3/uL (ref 0.0–0.1)
Basophils Relative: 0 %
Eosinophils Absolute: 0.2 10*3/uL (ref 0.0–0.5)
Eosinophils Relative: 2 %
HCT: 39.6 % (ref 39.0–52.0)
Hemoglobin: 13.9 g/dL (ref 13.0–17.0)
Immature Granulocytes: 1 %
Lymphocytes Relative: 14 %
Lymphs Abs: 1.6 10*3/uL (ref 0.7–4.0)
MCH: 30.8 pg (ref 26.0–34.0)
MCHC: 35.1 g/dL (ref 30.0–36.0)
MCV: 87.8 fL (ref 80.0–100.0)
Monocytes Absolute: 1.4 10*3/uL — ABNORMAL HIGH (ref 0.1–1.0)
Monocytes Relative: 12 %
Neutro Abs: 8.2 10*3/uL — ABNORMAL HIGH (ref 1.7–7.7)
Neutrophils Relative %: 71 %
Platelets: 230 10*3/uL (ref 150–400)
RBC: 4.51 MIL/uL (ref 4.22–5.81)
RDW: 13.2 % (ref 11.5–15.5)
WBC: 11.5 10*3/uL — ABNORMAL HIGH (ref 4.0–10.5)
nRBC: 0 % (ref 0.0–0.2)

## 2021-01-23 LAB — BRAIN NATRIURETIC PEPTIDE: B Natriuretic Peptide: 62.5 pg/mL (ref 0.0–100.0)

## 2021-01-23 LAB — PROCALCITONIN: Procalcitonin: 0.14 ng/mL

## 2021-01-23 LAB — GLUCOSE, CAPILLARY
Glucose-Capillary: 156 mg/dL — ABNORMAL HIGH (ref 70–99)
Glucose-Capillary: 166 mg/dL — ABNORMAL HIGH (ref 70–99)
Glucose-Capillary: 156 mg/dL — ABNORMAL HIGH (ref 70–99)
Glucose-Capillary: 150 mg/dL — ABNORMAL HIGH (ref 70–99)

## 2021-01-23 LAB — TSH: TSH: 1.418 u[IU]/mL (ref 0.350–4.500)

## 2021-01-23 LAB — MAGNESIUM: Magnesium: 2.1 mg/dL (ref 1.7–2.4)

## 2021-01-23 LAB — OSMOLALITY: Osmolality: 282 mOsm/kg (ref 275–295)

## 2021-01-23 LAB — URIC ACID: Uric Acid, Serum: 2 mg/dL — ABNORMAL LOW (ref 3.7–8.6)

## 2021-01-23 LAB — VITAMIN B1: Vitamin B1 (Thiamine): 150.4 nmol/L (ref 66.5–200.0)

## 2021-01-23 LAB — OSMOLALITY, URINE: Osmolality, Ur: 850 mOsm/kg (ref 300–900)

## 2021-01-23 MED ORDER — BISACODYL 10 MG RE SUPP
10.0000 mg | Freq: Every day | RECTAL | Status: AC
  Administered 2021-01-23: 10 mg via RECTAL
  Filled 2021-01-23 (×3): qty 1

## 2021-01-23 MED ORDER — LACTATED RINGERS IV SOLN: INTRAVENOUS | Status: DC

## 2021-01-23 MED ORDER — METOPROLOL TARTRATE 5 MG/5ML IV SOLN
10.0000 mg | Freq: Three times a day (TID) | INTRAVENOUS | Status: DC | PRN
  Administered 2021-01-23 – 2021-01-25 (×3): 10 mg via INTRAVENOUS
  Filled 2021-01-23 (×3): qty 10

## 2021-01-23 NOTE — Progress Notes (Addendum)
PROGRESS NOTE        PATIENT DETAILS Name: Vincent [name] Age: 70 y.o. Sex: male Date of Birth: 1951/05/19 Admit Date: 01/12/2021 Admitting Physician Lequita Halt, MD RJJ:OACZ, Emily Filbert., MD  Brief Narrative: Patient is a 70 y.o. male with history of insulin-dependent DM-2, HTN, CAD, chronic systolic heart failure, chronic pain syndrome on narcotics/benzos-presented with a 2-week history of worsening confusion, visual hallucinations and frequent falls.  Spouse reports slurred speech for a few days as well.  Patient was subsequently admitted to the hospitalist service for further evaluation and treatment.  Significant events: 4/7>> admit for evaluation of slurred speech, confusion, hallucination, frequent falls  Significant studies: 4/7>> CT head: Negative for acute abnormalities 4/7>> CT C-spine: No fracture 4/7>> x-ray right knee: Right knee replacement without hardware loosening or fracture 4/8>> Echo: EF 45-50%, apical akinesis, grade 1 diastolic dysfunction.  Swirling of contrast at apex consistent with low flow but no thrombus, +ve WMA. 4/8>> vitamin B12: 72 4/10 >> MRI Brain and C Spine - Small subacute infarct in the right parietal white matter, likely a current manifestation of the patient's extensive chronic small vessel ischemia, Moderate right neuroforaminal stenosis at C3-C4.  4/17 >> CT Chest - Abd-Pelvis with IV contrast - ? GB edema, constipation 4/17 - Leg [place] - no DVT  Antimicrobial therapy: None  Microbiology data: None  Procedures : None  Consults: Neurology x 2  DVT Prophylaxis : enoxaparin (LOVENOX) injection 40 mg Start: 01/13/21 1000   Subjective: Patient in bed appears to be in no distress but more somnolent this morning, unable to answer questions reliably or follow commands.   Assessment/Plan:  Encephalopathy/slurred speech/visual hallucinations: Probable polypharmacy-but need to rule out CVA due to slurred  speech-echo findings (smoke seen)-vitamin B12 deficiency may be contributing as well-continue to hold benzos/narcotics-increased Seroquel to 50 mg twice daily-use Haldol as needed.  Clinically improving, MRI brain noted and DW Neuro 01/15/21, neurological changes likely due to prolonged B12 deficiency, according to the wife symptoms have been gradually progressive over several months.  Will obtain EEG as well.  Neurology following, SNF vs CIR. Continue a.m. low-dose Seroquel.  Incidental small right parietal stroke noted.  He is on aspirin and statin, no deficits contributed to this, continue to monitor.  May require placement.  LDL under 70, A1c mildly elevated.  Vitamin B12 deficiency: On B12 supplementation-recheck vitamin B12 levels in 3 months.  Persistent tachycardia with mild leukocytosis with low-grade fevers due to aspiration pneumonia in the hospital and Ur. Retention.   Chest x-ray positive sided infiltrate, has been appropriately treated with antibiotics for aspiration and then for possible UTI, currently is holding his Flomax to relieve obstruction.  CT scan chest abdomen pelvis inconclusive, right upper quadrant inconclusive, obtaining HIDA scan to rule out any gallbladder pathology contributing to his tachycardia and low-grade fevers. Check TSH.  Urinary retention.  Foley and Flomax 01/21/21 - UA borderline, he was already on meropenem will extend course for 2 more days to stop on 01/25/2017.  Combined chronic systolic and diastolic heart failure EF around 50%: Euvolemic-since slightly dehydrated on 01/15/2021 hold further diuretics and hydrate.  CAD-s/p PCI to LAD in 2007: No anginal symptoms-has EF 45% with apical akinesis on echo-most recent nuclear stress test in 2017 showed a fixed defect in the apex stable for outpatient follow-up with primary cardiologist.  Remains  on aspirin. Will need outpt Cards follow up in 7-10 days post DC.  HTN: Blood pressure controlled-continue present  regimen.  Holding ACE inhibitor due to AKI.  HLD: Continue statin-LDL 60.  Mild clinical dehydration with hyponatremia and AKI.  Hydrate with IV fluids and monitor.  Check urine electrolytes.  Constipation.  received enema and continue bowel regimen.  DM-2 (A1c 7.2 on 4/7): CBG stable with SSI-hold all oral hypoglycemic agents  Recent Labs    01/22/21 1612 01/22/21 2224 01/23/21 0644  GLUCAP 180* 165* 156*    Obesity: Estimated body mass index is 31.61 kg/m as calculated from the following:   Height as of this encounter: 5\' 8"  (1.727 m).   Weight as of this encounter: 94.3 kg.   Diet: Diet Order            DIET DYS 2 Room service appropriate? Yes; Fluid consistency: Thin  Diet effective now                  Code Status: Full code   Family Communication:  [phone] updated on 01/14/21, message left at 10 AM on 01/15/2021, 01/16/21 updated, updated bedside 01/17/21  Disposition Plan: Status is: Inpatient  Remains inpatient appropriate because:Inpatient level of care appropriate due to severity of illness   Dispo: The patient is from: Home              Anticipated d/c is to: Home              Patient currently is not medically stable to d/c.   Difficult to place patient No   Barriers to Discharge: Ongoing altered mental status-awaiting further work-up-see above  Antimicrobial agents: Anti-infectives (From admission, onward)   Start     Dose/Rate Route Frequency Ordered Stop   01/22/21 1030  meropenem (MERREM) 1 g in sodium chloride 0.9 % 100 mL IVPB        1 g 200 mL/hr over 30 Minutes Intravenous Every 8 hours 01/22/21 0932 01/24/21 1029   01/19/21 2200  doxycycline (VIBRA-TABS) tablet 100 mg  Status:  Discontinued        100 mg Oral Every 12 hours 01/19/21 0903 01/22/21 0759   01/17/21 1830  doxycycline (VIBRAMYCIN) 100 mg in sodium chloride 0.9 % 250 mL IVPB  Status:  Discontinued        100 mg 125 mL/hr over 120 Minutes Intravenous Every 12  hours 01/17/21 1730 01/19/21 0903   01/17/21 1830  meropenem (MERREM) 1 g in sodium chloride 0.9 % 100 mL IVPB        1 g 200 mL/hr over 30 Minutes Intravenous Every 8 hours 01/17/21 1757 01/22/21 1222       Time spent:  35 minutes-Greater than 50% of this time was spent in counseling, explanation of diagnosis, planning of further management, and coordination of care.  MEDICATIONS:  Scheduled Meds: .  stroke: mapping our early stages of recovery book   Does not apply Once  . amLODipine  10 mg Oral Daily  . aspirin EC  81 mg Oral Daily  . bisacodyl  10 mg Rectal Daily  . Chlorhexidine Gluconate Cloth  6 each Topical Daily  . cyanocobalamin  1,000 mcg Subcutaneous Q30 days  . docusate sodium  200 mg Oral BID  . enoxaparin (LOVENOX) injection  40 mg Subcutaneous Q24H  . insulin aspart  0-15 Units Subcutaneous TID WC  . isosorbide mononitrate  30 mg Oral Daily  . metoprolol tartrate  100 mg Oral  BID  . multivitamin with minerals  1 tablet Oral Daily  . polyethylene glycol  17 g Oral BID  . QUEtiapine  25 mg Oral BID  . simvastatin  40 mg Oral QHS  . tamsulosin  0.4 mg Oral Daily  . thiamine  100 mg Oral Daily   Continuous Infusions: . lactated ringers    . meropenem (MERREM) IV 1 g (01/23/21 0144)   PRN Meds:.acetaminophen **OR** [DISCONTINUED] acetaminophen (TYLENOL) oral liquid 160 mg/5 mL **OR** [DISCONTINUED] acetaminophen, haloperidol lactate, hydrALAZINE, LORazepam, metoprolol tartrate, senna-docusate   PHYSICAL EXAM: Vital signs: Vitals:   01/23/21 0232 01/23/21 0310 01/23/21 0344 01/23/21 0852  BP:  (!) 150/73  (!) 160/89  Pulse: (!) 104 95 93 (!) 114  Resp:  20  16  Temp:  97.9 F (36.6 C)  99.3 F (37.4 C)  TempSrc:  Oral  Oral  SpO2:  97%  99%  Weight:      Height:       Filed Weights   01/17/21 1749  Weight: 94.3 kg   Body mass index is 31.61 kg/m.   Gen Exam:   Drowsy this am, remains extremely weak in both lower extremities, speech is more  comprehensible but still dysarthric, foley in place [place].AT,PERRAL Supple Neck,No JVD, No cervical lymphadenopathy appriciated.  Symmetrical Chest wall movement, Good air movement bilaterally, CTAB RRR,No Gallops, Rubs or new Murmurs, No Parasternal Heave +ve B.Sounds, Abd Soft, No tenderness, No organomegaly appriciated, No rebound - guarding or rigidity. No Cyanosis, Clubbing or edema, No new Rash or bruise   I have personally reviewed following labs and imaging studies  LABORATORY DATA:  Recent Labs  Lab 01/19/21 0254 01/20/21 0239 01/21/21 0218 01/22/21 0115 01/23/21 0506  WBC 11.7* 11.9* 11.9* 10.6* 11.5*  HGB 14.8 14.7 14.0 13.5 13.9  HCT 43.2 42.1 40.5 40.5 39.6  PLT 234 219 228 216 230  MCV 89.1 88.6 88.0 91.6 87.8  MCH 30.5 30.9 30.4 30.5 30.8  MCHC 34.3 34.9 34.6 33.3 35.1  RDW 13.6 13.3 13.4 13.2 13.2  LYMPHSABS 1.5 1.5 2.0 1.3 1.6  MONOABS 1.6* 1.4* 1.6* 1.1* 1.4*  EOSABS 0.0 0.0 0.1 0.1 0.2  BASOSABS 0.0 0.0 0.0 0.0 0.0    Recent Labs  Lab 01/19/21 0254 01/19/21 0529 01/19/21 0707 01/20/21 0239 01/21/21 0218 01/22/21 0115 01/23/21 0506  NA 134*  --   --  131* 134* 132* 131*  K 5.7*  --  4.0 4.0 3.8 4.2 3.9  CL 105  --   --  105 102 101 99  CO2 17*  --   --  18* 22 21* 22  GLUCOSE 170*  --   --  176* 158* 185* 156*  BUN 20  --   --  18 15 17 11   CREATININE 0.94  --   --  0.82 0.75 0.67 0.68  CALCIUM 9.6  --   --  9.5 9.5 9.2 9.0  AST 26  --   --  31 30 39 25  ALT 8  --   --  14 18 22 19   ALKPHOS 98  --   --  95 93 89 89  BILITOT 2.9*  --   --  1.7* 1.3* 1.3* 1.3*  ALBUMIN 3.2*  --   --  2.9* 2.7* 2.5* 2.2*  MG 2.4  --   --  2.1 2.1 2.0 2.1  PROCALCITON  --  <0.10  --  <0.10 <0.10 <0.10 0.14  BNP 34.8  --   --  27.6 42.5 30.6 62.5    Recent Labs  Lab 01/19/21 0254 01/19/21 0529 01/20/21 0239 01/21/21 0218 01/22/21 0115 01/23/21 0506  WBC 11.7*  --  11.9* 11.9* 10.6* 11.5*  HGB 14.8  --  14.7 14.0 13.5 13.9  HCT 43.2  --  42.1 40.5 40.5  39.6  PLT 234  --  219 228 216 230  BNP 34.8  --  27.6 42.5 30.6 62.5  PROCALCITON  --  <0.10 <0.10 <0.10 <0.10 0.14  AST 26  --  31 30 39 25  ALT 8  --  14 18 22 19   ALKPHOS 98  --  95 93 89 89  BILITOT 2.9*  --  1.7* 1.3* 1.3* 1.3*  ALBUMIN 3.2*  --  2.9* 2.7* 2.5* 2.2*      Urine analysis:    Component Value Date/Time   COLORURINE AMBER (A) 01/21/2021 1323   APPEARANCEUR CLEAR 01/21/2021 1323   LABSPEC 1.030 01/21/2021 1323   PHURINE 5.0 01/21/2021 1323   GLUCOSEU >=500 (A) 01/21/2021 1323   HGBUR SMALL (A) 01/21/2021 1323   BILIRUBINUR NEGATIVE 01/21/2021 1323   KETONESUR 80 (A) 01/21/2021 1323   PROTEINUR 30 (A) 01/21/2021 1323   UROBILINOGEN 0.2 01/06/2014 1257   NITRITE NEGATIVE 01/21/2021 1323   LEUKOCYTESUR NEGATIVE 01/21/2021 1323    Sepsis Labs: Lactic Acid, Venous No results found for: LATICACIDVEN  MICROBIOLOGY: Recent Results (from the past 240 hour(s))  Culture, Urine     Status: Abnormal   Collection Time: 01/17/21  6:16 PM   Specimen: Urine, Random  Result Value Ref Range Status   Specimen Description URINE, RANDOM  Final   Special Requests   Final    NONE Performed at [facility], [place] [address], [place], [place] [address]    Culture (A)  Final    >=100,000 COLONIES/mL MULTIPLE SPECIES PRESENT, SUGGEST RECOLLECTION   Report Status 01/19/2021 FINAL  Final  Culture, blood (routine x 2)     Status: None   Collection Time: 01/17/21  7:39 PM   Specimen: BLOOD RIGHT HAND  Result Value Ref Range Status   Specimen Description BLOOD RIGHT HAND  Final   Special Requests   Final    BOTTLES DRAWN AEROBIC AND ANAEROBIC Blood Culture adequate volume   Culture   Final    NO GROWTH 5 DAYS Performed at [facility], [place] [address] [place], [place] [address]    Report Status 01/22/2021 FINAL  Final  Culture, blood (routine x 2)     Status: None   Collection Time: 01/17/21  7:42 PM   Specimen: BLOOD  Result Value Ref Range Status   Specimen  Description BLOOD RIGHT ANTECUBITAL  Final   Special Requests   Final    BOTTLES DRAWN AEROBIC AND ANAEROBIC Blood Culture adequate volume   Culture   Final    NO GROWTH 5 DAYS Performed at [facility], [place] [address] [place], [place] [address]    Report Status 01/22/2021 FINAL  Final  Culture, Urine     Status: None   Collection Time: 01/21/21  7:44 AM   Specimen: Urine, Catheterized  Result Value Ref Range Status   Specimen Description URINE, CATHETERIZED  Final   Special Requests NONE  Final   Culture   Final    NO GROWTH Performed at [facility], [address], [place], [place] [address]    Report Status 01/22/2021 FINAL  Final    RADIOLOGY  STUDIES/RESULTS: CT CHEST ABDOMEN PELVIS W CONTRAST  Result Date: 01/22/2021 CLINICAL DATA:  Chest pain or shortness of breath. Sepsis. Effusion suspected. EXAM: CT CHEST, ABDOMEN, AND PELVIS WITH CONTRAST TECHNIQUE: Multidetector CT imaging of the chest, abdomen and pelvis was performed following the standard protocol during bolus administration of intravenous contrast. CONTRAST:  125mL OMNIPAQUE IOHEXOL 300 MG/ML  SOLN COMPARISON:  Plain film chest of 1 day prior. [place] 517 CTA chest. No prior abdominopelvic CTs. FINDINGS: CT CHEST FINDINGS Cardiovascular: Mild motion degradation throughout. Exam also degraded by patient arm position, not raised above the head. Right subclavian artery stent or calcification. Aortic atherosclerosis. Mild cardiomegaly, without pericardial effusion. Multivessel coronary artery atherosclerosis. No central pulmonary embolism, on this non-dedicated study. Mediastinum/Nodes: No mediastinal or hilar adenopathy. Lungs/Pleura: No pleural fluid.  Mild centrilobular emphysema. Musculoskeletal: No acute osseous abnormality. CT ABDOMEN PELVIS FINDINGS Hepatobiliary: Normal liver. The gallbladder is mildly distended with suggestion of pericholecystic edema, including on 62/3 and 16/8. No calcified stone or biliary  duct dilatation. Pancreas: Normal, without mass or ductal dilatation. Spleen: Normal in size, without focal abnormality. Adrenals/Urinary Tract: Mild left adrenal thickening. Normal right adrenal gland. Interpolar right renal 1.8 cm lesion measures on the order of 19 HU, favoring a minimally complex cyst. Inter/upper pole punctate right renal collecting system calculus. No hydronephrosis. Foley catheter within the urinary bladder. Stomach/Bowel: Normal stomach, without wall thickening. Moderate stool within the rectum, less so the remainder of the colon. Normal terminal ileum and appendix. Normal small bowel. Vascular/Lymphatic: Aortic atherosclerosis. No abdominopelvic adenopathy. Reproductive: Normal prostate. Other: No significant free fluid. No free intraperitoneal air. Right paramidline fat containing ventral pelvic wall hernia. Presumably iatrogenic subcutaneous air within the anterior pelvic wall. Musculoskeletal: Right hip osteoarthritis. Degenerative disc disease at the lumbosacral junction, presuming a transitional S1 vertebral body. IMPRESSION: 1. Multifactorial degradation, including motion and patient arm position. 2. Mildly distended gallbladder with suggestion of pericholecystic edema. Correlate with right upper quadrant symptoms and consider ultrasound. 3. Right nephrolithiasis. 4. Possible constipation or even fecal impaction. 5. Aortic Atherosclerosis (ICD10-I70.0) and Emphysema (ICD10-J43.9). Coronary artery atherosclerosis. Electronically Signed   By: Abigail Miyamoto M.D.   On: 01/22/2021 15:03   VAS [place] LOWER EXTREMITY VENOUS (DVT)  Result Date: 01/22/2021  Lower Venous DVT Study Indications: Fever.  Limitations: Altered mental status, patient in restraints. Comparison Study: No prior study Performing Technologist: Sharion Dove RVS  Examination Guidelines: A complete evaluation includes B-mode imaging, spectral Doppler, color Doppler, and power Doppler as needed of all accessible portions of  each vessel. Bilateral testing is considered an integral part of a complete examination. Limited examinations for reoccurring indications may be performed as noted. The reflux portion of the exam is performed with the patient in reverse Trendelenburg.  +---------+---------------+---------+-----------+----------+--------------+ RIGHT    CompressibilityPhasicitySpontaneityPropertiesThrombus Aging +---------+---------------+---------+-----------+----------+--------------+ CFV      Full           Yes      Yes                                 +---------+---------------+---------+-----------+----------+--------------+ SFJ      Full                                                        +---------+---------------+---------+-----------+----------+--------------+ FV  Prox  Full                                                        +---------+---------------+---------+-----------+----------+--------------+ FV Mid   Full                                                        +---------+---------------+---------+-----------+----------+--------------+ FV DistalFull                                                        +---------+---------------+---------+-----------+----------+--------------+ PFV      Full                                                        +---------+---------------+---------+-----------+----------+--------------+ POP      Full           Yes      Yes                                 +---------+---------------+---------+-----------+----------+--------------+ PTV      Full                                                        +---------+---------------+---------+-----------+----------+--------------+ PERO     Full                                                        +---------+---------------+---------+-----------+----------+--------------+   +---------+---------------+---------+-----------+----------+--------------+ LEFT      CompressibilityPhasicitySpontaneityPropertiesThrombus Aging +---------+---------------+---------+-----------+----------+--------------+ CFV      Full           Yes      Yes                                 +---------+---------------+---------+-----------+----------+--------------+ SFJ      Full                                                        +---------+---------------+---------+-----------+----------+--------------+ FV Prox  Full                                                        +---------+---------------+---------+-----------+----------+--------------+  FV Mid   Full                                                        +---------+---------------+---------+-----------+----------+--------------+ FV DistalFull                                                        +---------+---------------+---------+-----------+----------+--------------+ PFV      Full                                                        +---------+---------------+---------+-----------+----------+--------------+ POP      Full           Yes      Yes                                 +---------+---------------+---------+-----------+----------+--------------+ PTV      Full                                                        +---------+---------------+---------+-----------+----------+--------------+ PERO     Full                                                        +---------+---------------+---------+-----------+----------+--------------+     Summary: RIGHT: - There is no evidence of deep vein thrombosis in the lower extremity. However, portions of this examination were limited- see technologist comments above.  LEFT: - There is no evidence of deep vein thrombosis in the lower extremity. However, portions of this examination were limited- see technologist comments above.  *See table(s) above for measurements and observations.    Preliminary    US Abdomen Limited RUQ  (LIVER/GB)  Result Date: 01/22/2021 CLINICAL DATA:  Abdominal pain and suspicious findings on recent CT examination. EXAM: ULTRASOUND ABDOMEN LIMITED RIGHT UPPER QUADRANT COMPARISON:  CT from earlier in the same day. FINDINGS: Gallbladder: Gallbladder is well distended with sludge within. No pericholecystic fluid is noted. Mild wall thickening is noted to 5 mm. No cholelithiasis is seen. Common bile duct: Diameter: 6.2 mm. Liver: Mild fatty infiltration of the liver is noted. Portal vein is patent on color Doppler imaging with normal direction of blood flow towards the liver. Other: None. IMPRESSION: Gallbladder sludge without evidence of cholelithiasis. Some wall thickening is noted. These changes could represent some chronic cholecystitis. HIDA scan may be helpful in this regard. Electronically Signed   By: Inez Catalina M.D.   On: 01/22/2021 20:40     LOS: 11 days   Lala Lund, MD  [facility]  01/23/2021, 10:16 AM

## 2021-01-23 NOTE — Procedures (Signed)
Patient Name: Vincent [name]  MRN: [id number]  Epilepsy Attending: Lora Havens  Referring Physician/Provider: Dr Lala Lund Date: 01/23/2021 Duration: 22.30 mins  Patient history: 70 year old male with altered mental status.  EEG to evaluate for seizures.  Level of alertness: lethargic, asleep   AEDs during EEG study: None  Technical aspects: This EEG study was done with scalp electrodes positioned according to the 10-20 International system of electrode placement. Electrical activity was acquired at a sampling rate of 500Hz  and reviewed with a high frequency filter of 70Hz  and a low frequency filter of 1Hz . EEG data were recorded continuously and digitally stored.   Description: No clear posterior dominant rhythm was seen. Sleep was characterized by vertex waves, sleep spindles (12 to 14 Hz), maximal frontocentral region. EEG showed continuous generalized 5 to 7 Hz theta as well as intermittent 2-3Hz  delta slowing. Physiologic photic driving was not seen during photic stimulation. Hyperventilation was not performed.     ABNORMALITY - Continuous slow, generalized  IMPRESSION: This study is suggestive of moderate diffuse encephalopathy, nonspecific etiology. No seizures or epileptiform discharges were seen throughout the recording.  Richa Shor Barbra Sarks

## 2021-01-23 NOTE — Progress Notes (Signed)
PT Cancellation Note  Patient Details Name: Vincent [name] MRN: [id number] DOB: 16-Mar-1951   Cancelled Treatment:    Reason Eval/Treat Not Completed: Fatigue/lethargy limiting ability to participate. Unable to arouse to participate in therapy  Lyanne Co, DPT Acute Rehabilitation Services [phone]    [name] 01/23/2021, 2:25 PM

## 2021-01-23 NOTE — Plan of Care (Signed)
  Problem: Education: Goal: Knowledge of disease or condition will improve Outcome: Progressing Goal: Knowledge of secondary prevention will improve Outcome: Progressing Goal: Knowledge of patient specific risk factors addressed and post discharge goals established will improve Outcome: Progressing Goal: Individualized Educational Video(s) Outcome: Progressing   Problem: Coping: Goal: Will verbalize positive feelings about self Outcome: Progressing Goal: Will identify appropriate support needs Outcome: Progressing   Problem: Health Behavior/Discharge Planning: Goal: Ability to manage health-related needs will improve Outcome: Progressing   Problem: Self-Care: Goal: Ability to participate in self-care as condition permits will improve Outcome: Progressing Goal: Verbalization of feelings and concerns over difficulty with self-care will improve Outcome: Progressing Goal: Ability to communicate needs accurately will improve Outcome: Progressing   Problem: Nutrition: Goal: Risk of aspiration will decrease Outcome: Progressing Goal: Dietary intake will improve Outcome: Progressing   Problem: Ischemic Stroke/TIA Tissue Perfusion: Goal: Complications of ischemic stroke/TIA will be minimized Outcome: Progressing   Problem: Safety: Goal: Non-violent Restraint(s) Outcome: Progressing

## 2021-01-23 NOTE — Progress Notes (Signed)
Routine EEG complete. Results pending.

## 2021-01-23 NOTE — Progress Notes (Signed)
Inpatient Rehab Admissions Coordinator:   Pt. Remains obtunded, inappropriate for CIR at this time. I will continue to follow and potentially pursue for CIR admission if mentation improves and Pt. Is able to participate with therapies.   Clemens Catholic, [place], [place] Admissions Coordinator  [phone] ([place]) [phone] (office)

## 2021-01-24 ENCOUNTER — Inpatient Hospital Stay (HOSPITAL_COMMUNITY): Payer: HMO

## 2021-01-24 DIAGNOSIS — I10 Essential (primary) hypertension: Secondary | ICD-10-CM | POA: Diagnosis not present

## 2021-01-24 LAB — COMPREHENSIVE METABOLIC PANEL
ALT: 29 U/L (ref 0–44)
AST: 49 U/L — ABNORMAL HIGH (ref 15–41)
Albumin: 2.1 g/dL — ABNORMAL LOW (ref 3.5–5.0)
Alkaline Phosphatase: 87 U/L (ref 38–126)
Anion gap: 11 (ref 5–15)
BUN: 12 mg/dL (ref 8–23)
CO2: 23 mmol/L (ref 22–32)
Calcium: 9.1 mg/dL (ref 8.9–10.3)
Chloride: 102 mmol/L (ref 98–111)
Creatinine, Ser: 0.67 mg/dL (ref 0.61–1.24)
GFR, Estimated: >60 mL/min (ref >60)
Glucose, Bld: 143 mg/dL — ABNORMAL HIGH (ref 70–99)
Potassium: 4.3 mmol/L (ref 3.5–5.1)
Sodium: 136 mmol/L (ref 135–145)
Total Bilirubin: 1.1 mg/dL (ref 0.3–1.2)
Total Protein: 5.2 g/dL — ABNORMAL LOW (ref 6.5–8.1)

## 2021-01-24 LAB — GLUCOSE, CAPILLARY
Glucose-Capillary: 159 mg/dL — ABNORMAL HIGH (ref 70–99)
Glucose-Capillary: 177 mg/dL — ABNORMAL HIGH (ref 70–99)
Glucose-Capillary: 191 mg/dL — ABNORMAL HIGH (ref 70–99)
Glucose-Capillary: 145 mg/dL — ABNORMAL HIGH (ref 70–99)

## 2021-01-24 LAB — CBC WITH DIFFERENTIAL/PLATELET
Abs Immature Granulocytes: 0.09 10*3/uL — ABNORMAL HIGH (ref 0.00–0.07)
Basophils Absolute: 0 10*3/uL (ref 0.0–0.1)
Basophils Relative: 0 %
Eosinophils Absolute: 0.3 10*3/uL (ref 0.0–0.5)
Eosinophils Relative: 3 %
HCT: 40.1 % (ref 39.0–52.0)
Hemoglobin: 13.7 g/dL (ref 13.0–17.0)
Immature Granulocytes: 1 %
Lymphocytes Relative: 14 %
Lymphs Abs: 1.6 10*3/uL (ref 0.7–4.0)
MCH: 30.7 pg (ref 26.0–34.0)
MCHC: 34.2 g/dL (ref 30.0–36.0)
MCV: 89.9 fL (ref 80.0–100.0)
Monocytes Absolute: 1.3 10*3/uL — ABNORMAL HIGH (ref 0.1–1.0)
Monocytes Relative: 11 %
Neutro Abs: 8 10*3/uL — ABNORMAL HIGH (ref 1.7–7.7)
Neutrophils Relative %: 71 %
Platelets: 177 10*3/uL (ref 150–400)
RBC: 4.46 MIL/uL (ref 4.22–5.81)
RDW: 13.2 % (ref 11.5–15.5)
WBC: 11.4 10*3/uL — ABNORMAL HIGH (ref 4.0–10.5)
nRBC: 0 % (ref 0.0–0.2)

## 2021-01-24 LAB — MAGNESIUM: Magnesium: 2.2 mg/dL (ref 1.7–2.4)

## 2021-01-24 LAB — BRAIN NATRIURETIC PEPTIDE: B Natriuretic Peptide: 242.2 pg/mL — ABNORMAL HIGH (ref 0.0–100.0)

## 2021-01-24 LAB — PROCALCITONIN: Procalcitonin: <0.1 ng/mL

## 2021-01-24 MED ORDER — TECHNETIUM TC 99M MEBROFENIN IV KIT
5.0000 | PACK | Freq: Once | INTRAVENOUS | Status: AC | PRN
  Administered 2021-01-24: 5 via INTRAVENOUS

## 2021-01-24 NOTE — Progress Notes (Signed)
OT Cancellation Note  Patient Details Name: Vincent [name] MRN: [id number] DOB: [birth year]   Cancelled Treatment:    Reason Eval/Treat Not Completed: Patient at procedure or test/ unavailable;Other (comment) Will check back as time allows.   Harley Alto., COTA/L Acute Rehabilitation Services [phone]   Precious Haws 01/24/2021, 9:46 AM

## 2021-01-24 NOTE — Progress Notes (Signed)
Pt NPO at midnight 01/24/2021.

## 2021-01-24 NOTE — Plan of Care (Signed)
  Problem: Education: Goal: Knowledge of disease or condition will improve Outcome: Progressing Goal: Knowledge of secondary prevention will improve Outcome: Progressing Goal: Knowledge of patient specific risk factors addressed and post discharge goals established will improve Outcome: Progressing Goal: Individualized Educational Video(s) Outcome: Progressing   Problem: Coping: Goal: Will verbalize positive feelings about self Outcome: Progressing Goal: Will identify appropriate support needs Outcome: Progressing   Problem: Health Behavior/Discharge Planning: Goal: Ability to manage health-related needs will improve Outcome: Progressing   Problem: Self-Care: Goal: Ability to participate in self-care as condition permits will improve Outcome: Progressing Goal: Verbalization of feelings and concerns over difficulty with self-care will improve Outcome: Progressing Goal: Ability to communicate needs accurately will improve Outcome: Progressing   Problem: Nutrition: Goal: Risk of aspiration will decrease Outcome: Progressing Goal: Dietary intake will improve Outcome: Progressing   Problem: Ischemic Stroke/TIA Tissue Perfusion: Goal: Complications of ischemic stroke/TIA will be minimized Outcome: Progressing   Problem: Safety: Goal: Non-violent Restraint(s) Outcome: Progressing

## 2021-01-24 NOTE — Consult Note (Signed)
[name]  Vincent [name] May 12, 1951  [id number].    Requesting MD: Lala Lund Chief Complaint/Reason for Consult: cholecystitis?  HPI:  Vincent [name] is a 70yo male PMH HTN, HLD, DM, obesity, CAD-s/p PCI to LAD in 3716, Chronic systolic and diastolic heart failure who was admitted to [facility] 4/7 with 2 weeks of worsening confusion, visual hallucinations and frequent falls. He was admitted to the medical service for further work up, and his encephalopathy is suspected to be secondary to Vitamin B12 deficiency. Per report he has had persistent leukocytosis, tachycardia, and low grade fevers. Infectious work up thus far negative. HIDA scan was ordered to evaluate for possible cholecystitis and reports possible chronic cholecystitis; gallbladder not visualized at 60 minutes but patient refused additional imaging therefore patency of the cystic duct not confirmed. WBC stable at 11.4, TMAX 99.3, off antibiotics.  Patient has no abdominal complaints. He denies pain, nausea, or vomiting. He has been tolerating a dysphagia diet without abdominal symptoms.  Abdominal surgical history: none  ROS  All systems reviewed and otherwise negative except for as above  Family History  Problem Relation Age of Onset  . Coronary artery disease Other        positive family hx of  . Colon cancer Neg Hx   . Esophageal cancer Neg Hx   . Liver cancer Neg Hx   . Pancreatic cancer Neg Hx   . Rectal cancer Neg Hx   . Stomach cancer Neg Hx     Past Medical History:  Diagnosis Date  . Acute MI ([place])   . Allergy   . Anxiety   . Arthritis   . Coronary artery disease   . Depression   . Diabetes mellitus without complication ([place])   . Diverticulosis    mild  . Hemorrhoids   . HOH (hard of hearing)   . Hx of colonic polyps   . Hyperlipidemia   . Hypertension   . Left ventricular dysfunction    hx of    Past Surgical History:  Procedure Laterality Date  . ANGIOPLASTY      and bare metal stent placement  . ANKLE SURGERY Right 1986   i&d abscess- pt states "put a bone in it"  . CORONARY ANGIOPLASTY WITH STENT PLACEMENT    . KNEE ARTHROPLASTY Right 08/09/2016   Procedure: RIGHT TOTAL KNEE ARTHROPLASTY WITH COMPUTER NAVIGATION;  Surgeon: Rod Can, MD;  Location: [facility];  Service: Orthopedics;  Laterality: Right;  Needs RNFA  . LEFT HEART CATH AND CORONARY ANGIOGRAPHY N/A 11/21/2018   Procedure: LEFT HEART CATH AND CORONARY ANGIOGRAPHY;  Surgeon: Burnell Blanks, MD;  Location: [facility];  Service: Cardiovascular;  Laterality: N/A;    Social History:  reports that he quit smoking about 15 years ago. His smoking use included cigars. He quit smokeless tobacco use about 15 years ago. He reports that he does not drink alcohol and does not use drugs.  Allergies:  Allergies  Allergen Reactions  . Penicillins Other (See Comments)    Disputed in 2022 Has patient had a PCN reaction causing immediate rash, facial/tongue/throat swelling, SOB or lightheadedness with hypotension: Unknown Has patient had a PCN reaction causing severe rash involving mucus membranes or skin necrosis: Unknown Has patient had a PCN reaction that required hospitalization: Unknown Has patient had a PCN reaction occurring within the last 10 years: No If all of the above answers are "NO", then may proceed with Cephalosporin use.  Facility-Administered Medications Prior to Admission  Medication Dose Route Frequency Provider Last Rate Last Admin  . 0.9 %  sodium chloride infusion  500 mL Intravenous Once Ladene Artist, MD       Medications Prior to Admission  Medication Sig Dispense Refill  . aspirin EC 81 MG tablet Take 1 tablet (81 mg total) by mouth daily. (Patient taking differently: Take 81 mg by mouth at bedtime.) 90 tablet 3  . cyclobenzaprine (FLEXERIL) 10 MG tablet Take 10 mg by mouth in the morning and at bedtime.    . furosemide (LASIX) 40 MG tablet TAKE 1  TABLET BY MOUTH TWICE A DAY (Patient taking differently: Take 40 mg by mouth 2 (two) times daily.) 180 tablet 3  . HYDROcodone-acetaminophen (NORCO/VICODIN) 5-325 MG tablet Take 1 tablet by mouth every 8 (eight) hours as needed for moderate pain.   0  . isosorbide mononitrate (IMDUR) 30 MG 24 hr tablet Take 1 tablet (30 mg total) by mouth daily. 90 tablet 3  . LORazepam (ATIVAN) 1 MG tablet Take 1 mg by mouth every 8 (eight) hours.  3  . meloxicam (MOBIC) 7.5 MG tablet Take 1 tablet (7.5 mg total) by mouth daily. (Patient taking differently: Take 7.5 mg by mouth at bedtime.) 7 tablet 0  . metFORMIN (GLUCOPHAGE) 1000 MG tablet Take 1,000 mg by mouth 2 (two) times daily.    . metoprolol tartrate (LOPRESSOR) 50 MG tablet TAKE 1 TABLET BY MOUTH TWICE A DAY (Patient taking differently: Take 50 mg by mouth in the morning and at bedtime.) 180 tablet 3  . nitroGLYCERIN (NITROSTAT) 0.4 MG SL tablet Place 1 tablet (0.4 mg total) under the tongue every 5 (five) minutes as needed for chest pain. 25 tablet 3  . ramipril (ALTACE) 2.5 MG capsule Take 1 capsule (2.5 mg total) by mouth daily. (Patient taking differently: Take 2.5 mg by mouth in the morning.) 90 capsule 3  . sertraline (ZOLOFT) 50 MG tablet Take 50 mg by mouth at bedtime.    . simvastatin (ZOCOR) 40 MG tablet Take 1 tablet (40 mg total) by mouth at bedtime. 90 tablet 3  . traMADol (ULTRAM) 50 MG tablet Take 50 mg by mouth every 8 (eight) hours as needed for moderate pain.     . diazepam (VALIUM) 5 MG tablet Take 5-10 mg by mouth See admin instructions. Take 5-10 mg by mouth one hour prior to scan(s)    . JARDIANCE 25 MG TABS tablet Take 25 mg by mouth daily.    . propranolol (INDERAL) 10 MG tablet Take 20 mg by mouth See admin instructions. Take 20 mg (2 tablets) by mouth one hour prior to MRI scan(s)      Prior to Admission medications   Medication Sig Start Date End Date Taking? Authorizing Provider  aspirin EC 81 MG tablet Take 1 tablet (81 mg  total) by mouth daily. Patient taking differently: Take 81 mg by mouth at bedtime. 11/04/17  Yes Lyda Jester M, PA-C  cyclobenzaprine (FLEXERIL) 10 MG tablet Take 10 mg by mouth in the morning and at bedtime. 10/13/18  Yes [provider]  furosemide (LASIX) 40 MG tablet TAKE 1 TABLET BY MOUTH TWICE A DAY Patient taking differently: Take 40 mg by mouth 2 (two) times daily. 09/21/20  Yes Burnell Blanks, MD  HYDROcodone-acetaminophen (NORCO/VICODIN) 5-325 MG tablet Take 1 tablet by mouth every 8 (eight) hours as needed for moderate pain.  01/03/18  Yes [provider]  isosorbide mononitrate (IMDUR)  30 MG 24 hr tablet Take 1 tablet (30 mg total) by mouth daily. 08/02/20  Yes Burnell Blanks, MD  LORazepam (ATIVAN) 1 MG tablet Take 1 mg by mouth every 8 (eight) hours. 06/22/16  Yes [provider]  meloxicam (MOBIC) 7.5 MG tablet Take 1 tablet (7.5 mg total) by mouth daily. Patient taking differently: Take 7.5 mg by mouth at bedtime. 03/20/14  Yes Palumbo, April, MD  metFORMIN (GLUCOPHAGE) 1000 MG tablet Take 1,000 mg by mouth 2 (two) times daily.   Yes [provider]  metoprolol tartrate (LOPRESSOR) 50 MG tablet TAKE 1 TABLET BY MOUTH TWICE A DAY Patient taking differently: Take 50 mg by mouth in the morning and at bedtime. 09/21/20  Yes Burnell Blanks, MD  nitroGLYCERIN (NITROSTAT) 0.4 MG SL tablet Place 1 tablet (0.4 mg total) under the tongue every 5 (five) minutes as needed for chest pain. 11/04/17  Yes Lyda Jester M, PA-C  ramipril (ALTACE) 2.5 MG capsule Take 1 capsule (2.5 mg total) by mouth daily. Patient taking differently: Take 2.5 mg by mouth in the morning. 08/12/20  Yes Burnell Blanks, MD  sertraline (ZOLOFT) 50 MG tablet Take 50 mg by mouth at bedtime. 02/14/19  Yes [provider]  simvastatin (ZOCOR) 40 MG tablet Take 1 tablet (40 mg total) by mouth at bedtime. 11/04/17  Yes Rosita Fire, Brittainy M, PA-C   traMADol (ULTRAM) 50 MG tablet Take 50 mg by mouth every 8 (eight) hours as needed for moderate pain.    Yes [provider]  diazepam (VALIUM) 5 MG tablet Take 5-10 mg by mouth See admin instructions. Take 5-10 mg by mouth one hour prior to scan(s)    [provider]  JARDIANCE 25 MG TABS tablet Take 25 mg by mouth daily. 10/31/17   [provider]  propranolol (INDERAL) 10 MG tablet Take 20 mg by mouth See admin instructions. Take 20 mg (2 tablets) by mouth one hour prior to MRI scan(s)    [provider]    Blood pressure 112/74, pulse (!) 103, temperature 98.6 F (37 C), temperature source Oral, resp. rate 20, height 5\' 8"  (1.727 m), weight 94.3 kg, SpO2 98 %. Physical Exam: General: WD/WN male who is laying in bed in NAD HEENT: head is normocephalic, atraumatic.  Sclera are noninjected.  Pupils equal and round.  Ears and nose without any masses or lesions.  Mouth is pink and moist. Dentition fair Heart: sinus  tachy.  Normal s1,s2. No obvious murmurs, gallops, or rubs noted.  Palpable pedal pulses bilaterally  Lungs: CTAB, no wheezes, rhonchi, or rales noted.  Respiratory effort nonlabored Abd: soft, NT/ND, +BS, no masses or organomegaly. Soft ventral hernia without overlying skin changes MS: no BUE/BLE edema, calves soft and nontender Skin: warm and dry with no masses, lesions, or rashes Psych: A&Ox3 but does seem somewhat confused Neuro: cranial nerves grossly intact, equal strength in BUE/BLE bilaterally, normal speech, thought process intact  Results for orders placed or performed during the hospital encounter of 01/12/21 (from the past 48 hour(s))  Glucose, capillary     Status: Abnormal   Collection Time: 01/22/21  4:12 PM  Result Value Ref Range   Glucose-Capillary 180 (H) 70 - 99 mg/dL    Comment: Glucose reference range applies only to samples taken after fasting for at least 8 hours.  Glucose, capillary     Status: Abnormal   Collection  Time: 01/22/21 10:24 PM  Result Value Ref Range   Glucose-Capillary  165 (H) 70 - 99 mg/dL    Comment: Glucose reference range applies only to samples taken after fasting for at least 8 hours.  Brain natriuretic peptide     Status: None   Collection Time: 01/23/21  5:06 AM  Result Value Ref Range   B Natriuretic Peptide 62.5 0.0 - 100.0 pg/mL    Comment: Performed at [facility] [address] [place], [place] [address]  CBC with Differential/Platelet     Status: Abnormal   Collection Time: 01/23/21  5:06 AM  Result Value Ref Range   WBC 11.5 (H) 4.0 - 10.5 K/uL   RBC 4.51 4.22 - 5.81 MIL/uL   Hemoglobin 13.9 13.0 - 17.0 g/dL   HCT 39.6 39.0 - 52.0 %   MCV 87.8 80.0 - 100.0 fL   MCH 30.8 26.0 - 34.0 pg   MCHC 35.1 30.0 - 36.0 g/dL   RDW 13.2 11.5 - 15.5 %   Platelets 230 150 - 400 K/uL   nRBC 0.0 0.0 - 0.2 %   Neutrophils Relative % 71 %   Neutro Abs 8.2 (H) 1.7 - 7.7 K/uL   Lymphocytes Relative 14 %   Lymphs Abs 1.6 0.7 - 4.0 K/uL   Monocytes Relative 12 %   Monocytes Absolute 1.4 (H) 0.1 - 1.0 K/uL   Eosinophils Relative 2 %   Eosinophils Absolute 0.2 0.0 - 0.5 K/uL   Basophils Relative 0 %   Basophils Absolute 0.0 0.0 - 0.1 K/uL   Immature Granulocytes 1 %   Abs Immature Granulocytes 0.08 (H) 0.00 - 0.07 K/uL    Comment: Performed at [facility] [address] [place], [place] [address]  Comprehensive metabolic panel     Status: Abnormal   Collection Time: 01/23/21  5:06 AM  Result Value Ref Range   Sodium 131 (L) 135 - 145 mmol/L   Potassium 3.9 3.5 - 5.1 mmol/L   Chloride 99 98 - 111 mmol/L   CO2 22 22 - 32 mmol/L   Glucose, Bld 156 (H) 70 - 99 mg/dL    Comment: Glucose reference range applies only to samples taken after fasting for at least 8 hours.   BUN 11 8 - 23 mg/dL   Creatinine, Ser 0.68 0.61 - 1.24 mg/dL   Calcium 9.0 8.9 - 10.3 mg/dL   Total Protein 5.6 (L) 6.5 - 8.1 g/dL   Albumin 2.2 (L) 3.5 - 5.0 g/dL   AST 25 15 - 41 U/L   ALT 19 0  - 44 U/L   Alkaline Phosphatase 89 38 - 126 U/L   Total Bilirubin 1.3 (H) 0.3 - 1.2 mg/dL   GFR, Estimated >60 >60 mL/min    Comment: (NOTE) Calculated using the CKD-EPI Creatinine Equation (2021)    Anion gap 10 5 - 15    Comment: Performed at [facility], [place] [address] [place], [place] [address]  Magnesium     Status: None   Collection Time: 01/23/21  5:06 AM  Result Value Ref Range   Magnesium 2.1 1.7 - 2.4 mg/dL    Comment: Performed at [facility] [address] [place], [place] [address]  Procalcitonin     Status: None   Collection Time: 01/23/21  5:06 AM  Result Value Ref Range   Procalcitonin 0.14 ng/mL    Comment:        Interpretation: PCT (Procalcitonin) <= 0.5 ng/mL: Systemic infection (sepsis) is not likely. Local bacterial infection is  possible. (NOTE)       Sepsis PCT Algorithm           Lower Respiratory Tract                                      Infection PCT Algorithm    ----------------------------     ----------------------------         PCT < 0.25 ng/mL                PCT < 0.10 ng/mL          Strongly encourage             Strongly discourage   discontinuation of antibiotics    initiation of antibiotics    ----------------------------     -----------------------------       PCT 0.25 - 0.50 ng/mL            PCT 0.10 - 0.25 ng/mL               OR       >80% decrease in PCT            Discourage initiation of                                            antibiotics      Encourage discontinuation           of antibiotics    ----------------------------     -----------------------------         PCT >= 0.50 ng/mL              PCT 0.26 - 0.50 ng/mL               AND        <80% decrease in PCT             Encourage initiation of                                             antibiotics       Encourage continuation           of antibiotics    ----------------------------     -----------------------------        PCT >= 0.50 ng/mL                   PCT > 0.50 ng/mL               AND         increase in PCT                  Strongly encourage                                      initiation of antibiotics    Strongly encourage escalation           of antibiotics                                     -----------------------------  PCT <= 0.25 ng/mL                                                 OR                                        > 80% decrease in PCT                                      Discontinue / Do not initiate                                             antibiotics  Performed at [facility], [place] [address] [place], [place] [address]   Glucose, capillary     Status: Abnormal   Collection Time: 01/23/21  6:44 AM  Result Value Ref Range   Glucose-Capillary 156 (H) 70 - 99 mg/dL    Comment: Glucose reference range applies only to samples taken after fasting for at least 8 hours.  Osmolality     Status: None   Collection Time: 01/23/21 10:18 AM  Result Value Ref Range   Osmolality 282 275 - 295 mOsm/kg    Comment: Performed at [facility], [place] [address], [place], [place] [address]  Uric acid     Status: Abnormal   Collection Time: 01/23/21 10:18 AM  Result Value Ref Range   Uric Acid, Serum 2.0 (L) 3.7 - 8.6 mg/dL    Comment: Performed at [facility] [address], [place], [place] [address]  TSH     Status: None   Collection Time: 01/23/21 10:26 AM  Result Value Ref Range   TSH 1.418 0.350 - 4.500 uIU/mL    Comment: Performed by a 3rd Generation assay with a functional sensitivity of <=0.01 uIU/mL. Performed at [facility], [place] [address] [place], [place] [address]   Urinalysis, Routine w reflex microscopic Urine, Catheterized     Status: Abnormal   Collection Time: 01/23/21 12:01 PM  Result Value Ref Range   Color, Urine AMBER (A) YELLOW    Comment: BIOCHEMICALS MAY BE AFFECTED BY COLOR   APPearance HAZY (A) CLEAR   Specific Gravity,  Urine 1.029 1.005 - 1.030   pH 5.0 5.0 - 8.0   Glucose, UA >=500 (A) NEGATIVE mg/dL   Hgb urine dipstick SMALL (A) NEGATIVE   Bilirubin Urine NEGATIVE NEGATIVE   Ketones, ur 80 (A) NEGATIVE mg/dL   Protein, ur 30 (A) NEGATIVE mg/dL   Nitrite NEGATIVE NEGATIVE   Leukocytes,Ua TRACE (A) NEGATIVE   RBC / HPF 21-50 0 - 5 RBC/hpf   WBC, UA 11-20 0 - 5 WBC/hpf   Bacteria, UA RARE (A) NONE SEEN   Squamous Epithelial / LPF 0-5 0 - 5   Mucus PRESENT     Comment: Performed at [facility], [address] [place], [place] [address]  Sodium, urine, random     Status: None   Collection Time: 01/23/21 12:01 PM  Result Value Ref Range  Sodium, Ur 52 mmol/L    Comment: Performed at [facility] [address] [place], [place] [address]  Osmolality, urine     Status: None   Collection Time: 01/23/21 12:01 PM  Result Value Ref Range   Osmolality, Ur 850 300 - 900 mOsm/kg    Comment: Performed at [facility] [address] [place], [place] [address]  Glucose, capillary     Status: Abnormal   Collection Time: 01/23/21  1:18 PM  Result Value Ref Range   Glucose-Capillary 166 (H) 70 - 99 mg/dL    Comment: Glucose reference range applies only to samples taken after fasting for at least 8 hours.  Glucose, capillary     Status: Abnormal   Collection Time: 01/23/21  5:35 PM  Result Value Ref Range   Glucose-Capillary 156 (H) 70 - 99 mg/dL    Comment: Glucose reference range applies only to samples taken after fasting for at least 8 hours.  Glucose, capillary     Status: Abnormal   Collection Time: 01/23/21  9:16 PM  Result Value Ref Range   Glucose-Capillary 150 (H) 70 - 99 mg/dL    Comment: Glucose reference range applies only to samples taken after fasting for at least 8 hours.   Comment 1 Notify RN    Comment 2 Document in Chart   Procalcitonin     Status: None   Collection Time: 01/24/21  3:53 AM  Result Value Ref Range   Procalcitonin <0.10 ng/mL    Comment:         Interpretation: PCT (Procalcitonin) <= 0.5 ng/mL: Systemic infection (sepsis) is not likely. Local bacterial infection is possible. (NOTE)       Sepsis PCT Algorithm           Lower Respiratory Tract                                      Infection PCT Algorithm    ----------------------------     ----------------------------         PCT < 0.25 ng/mL                PCT < 0.10 ng/mL          Strongly encourage             Strongly discourage   discontinuation of antibiotics    initiation of antibiotics    ----------------------------     -----------------------------       PCT 0.25 - 0.50 ng/mL            PCT 0.10 - 0.25 ng/mL               OR       >80% decrease in PCT            Discourage initiation of                                            antibiotics      Encourage discontinuation           of antibiotics    ----------------------------     -----------------------------         PCT >= 0.50 ng/mL              PCT  0.26 - 0.50 ng/mL               AND        <80% decrease in PCT             Encourage initiation of                                             antibiotics       Encourage continuation           of antibiotics    ----------------------------     -----------------------------        PCT >= 0.50 ng/mL                  PCT > 0.50 ng/mL               AND         increase in PCT                  Strongly encourage                                      initiation of antibiotics    Strongly encourage escalation           of antibiotics                                     -----------------------------                                           PCT <= 0.25 ng/mL                                                 OR                                        > 80% decrease in PCT                                      Discontinue / Do not initiate                                             antibiotics  Performed at [facility], [address], [place], [place] [address]    Brain natriuretic peptide     Status: Abnormal   Collection Time: 01/24/21  3:53 AM  Result Value Ref Range   B Natriuretic Peptide 242.2 (H) 0.0 - 100.0 pg/mL    Comment: Performed at [facility] [address], [place], [place] [address]  CBC with Differential/Platelet     Status: Abnormal   Collection Time: 01/24/21  3:53 AM  Result Value Ref Range   WBC 11.4 (H) 4.0 - 10.5 K/uL   RBC 4.46 4.22 - 5.81 MIL/uL   Hemoglobin 13.7 13.0 - 17.0 g/dL   HCT 40.1 39.0 - 52.0 %   MCV 89.9 80.0 - 100.0 fL   MCH 30.7 26.0 - 34.0 pg   MCHC 34.2 30.0 - 36.0 g/dL   RDW 13.2 11.5 - 15.5 %   Platelets 177 150 - 400 K/uL   nRBC 0.0 0.0 - 0.2 %   Neutrophils Relative % 71 %   Neutro Abs 8.0 (H) 1.7 - 7.7 K/uL   Lymphocytes Relative 14 %   Lymphs Abs 1.6 0.7 - 4.0 K/uL   Monocytes Relative 11 %   Monocytes Absolute 1.3 (H) 0.1 - 1.0 K/uL   Eosinophils Relative 3 %   Eosinophils Absolute 0.3 0.0 - 0.5 K/uL   Basophils Relative 0 %   Basophils Absolute 0.0 0.0 - 0.1 K/uL   Immature Granulocytes 1 %   Abs Immature Granulocytes 0.09 (H) 0.00 - 0.07 K/uL    Comment: Performed at [facility], [address] [place], [place] [address]  Comprehensive metabolic panel     Status: Abnormal   Collection Time: 01/24/21  3:53 AM  Result Value Ref Range   Sodium 136 135 - 145 mmol/L   Potassium 4.3 3.5 - 5.1 mmol/L   Chloride 102 98 - 111 mmol/L   CO2 23 22 - 32 mmol/L   Glucose, Bld 143 (H) 70 - 99 mg/dL    Comment: Glucose reference range applies only to samples taken after fasting for at least 8 hours.   BUN 12 8 - 23 mg/dL   Creatinine, Ser 0.67 0.61 - 1.24 mg/dL   Calcium 9.1 8.9 - 10.3 mg/dL   Total Protein 5.2 (L) 6.5 - 8.1 g/dL   Albumin 2.1 (L) 3.5 - 5.0 g/dL   AST 49 (H) 15 - 41 U/L   ALT 29 0 - 44 U/L   Alkaline Phosphatase 87 38 - 126 U/L   Total Bilirubin 1.1 0.3 - 1.2 mg/dL   GFR, Estimated >60 >60 mL/min    Comment: (NOTE) Calculated using the CKD-EPI Creatinine  Equation (2021)    Anion gap 11 5 - 15    Comment: Performed at [facility], [place] [address], [place], [place] [address]  Magnesium     Status: None   Collection Time: 01/24/21  3:53 AM  Result Value Ref Range   Magnesium 2.2 1.7 - 2.4 mg/dL    Comment: Performed at [facility] [address], [place], [place] [address]  Glucose, capillary     Status: Abnormal   Collection Time: 01/24/21  6:22 AM  Result Value Ref Range   Glucose-Capillary 159 (H) 70 - 99 mg/dL    Comment: Glucose reference range applies only to samples taken after fasting for at least 8 hours.   Comment 1 Notify RN    Comment 2 Document in Chart   Glucose, capillary     Status: Abnormal   Collection Time: 01/24/21 12:25 PM  Result Value Ref Range   Glucose-Capillary 177 (H) 70 - 99 mg/dL    Comment: Glucose reference range applies only to samples taken after fasting for at least 8 hours.   Comment 1 Notify RN    Comment 2 Document in Chart    NM Hepato W/EF  Result Date: 01/24/2021 CLINICAL DATA:  Biliary colic, gallbladder sludge  and wall thickening on ultrasound EXAM: NUCLEAR MEDICINE HEPATOBILIARY IMAGING TECHNIQUE: Sequential images of the abdomen were obtained out to 60 minutes following intravenous administration of radiopharmaceutical. RADIOPHARMACEUTICALS:  5.25 mCi Tc-19m  Choletec IV COMPARISON:  Right upper quadrant ultrasound dated 01/22/2021. CT abdomen/pelvis dated 01/22/2021. FINDINGS: Prompt uptake and biliary excretion of activity by the liver is seen. Biliary activity passes into small bowel, consistent with patent common bile duct. Gallbladder not visualized at 60 minutes. Patient refused additional imaging. As such, patency of the cystic duct cannot be confirmed, and cholecystitis is not excluded. When correlating with prior studies, gallbladder was mildly distended with wall thickening on CT. No gallstones were evident on ultrasound. Overall clinical picture favors chronic cholecystitis  over acute cholecystitis. IMPRESSION: Nonvisualization of the gallbladder, raising concern for cholecystitis, possibly chronic. These results will be called to the ordering clinician or representative by the Radiologist Assistant, and communication documented in the PACS or [employer]. Electronically Signed   By: Vincent [name] M.D.   On: 01/24/2021 12:33   EEG adult  Result Date: 01/23/2021 Vincent Havens, MD     01/23/2021 10:18 AM Patient Name: KHARSON RASMUSSON MRN: [id number] Epilepsy Attending: Lora [name] Referring Physician/Provider: Dr Lala Lund Date: 01/23/2021 Duration: 22.30 mins Patient history: 70 year old male with altered mental status.  EEG to evaluate for seizures. Level of alertness: lethargic, asleep AEDs during EEG study: None Technical aspects: This EEG study was done with scalp electrodes positioned according to the 10-20 International system of electrode placement. Electrical activity was acquired at a sampling rate of 500Hz  and reviewed with a high frequency filter of 70Hz  and a low frequency filter of 1Hz . EEG data were recorded continuously and digitally stored. Description: No clear posterior dominant rhythm was seen. Sleep was characterized by vertex waves, sleep spindles (12 to 14 Hz), maximal frontocentral region. EEG showed continuous generalized 5 to 7 Hz theta as well as intermittent 2-3Hz  delta slowing. Physiologic photic driving was not seen during photic stimulation. Hyperventilation was not performed.   ABNORMALITY - Continuous slow, generalized IMPRESSION: This study is suggestive of moderate diffuse encephalopathy, nonspecific etiology. No seizures or epileptiform discharges were seen throughout the recording. Vincent [name]   US Abdomen Limited RUQ (LIVER/GB)  Result Date: 01/22/2021 CLINICAL DATA:  Abdominal pain and suspicious findings on recent CT examination. EXAM: ULTRASOUND ABDOMEN LIMITED RIGHT UPPER QUADRANT COMPARISON:  CT from earlier in  the same day. FINDINGS: Gallbladder: Gallbladder is well distended with sludge within. No pericholecystic fluid is noted. Mild wall thickening is noted to 5 mm. No cholelithiasis is seen. Common bile duct: Diameter: 6.2 mm. Liver: Mild fatty infiltration of the liver is noted. Portal vein is patent on color Doppler imaging with normal direction of blood flow towards the liver. Other: None. IMPRESSION: Gallbladder sludge without evidence of cholelithiasis. Some wall thickening is noted. These changes could represent some chronic cholecystitis. HIDA scan may be helpful in this regard. Electronically Signed   By: Inez Catalina M.D.   On: 01/22/2021 20:40    Anti-infectives (From admission, onward)   Start     Dose/Rate Route Frequency Ordered Stop   01/22/21 1030  meropenem (MERREM) 1 g in sodium chloride 0.9 % 100 mL IVPB        1 g 200 mL/hr over 30 Minutes Intravenous Every 8 hours 01/22/21 0932 01/24/21 0258   01/19/21 2200  doxycycline (VIBRA-TABS) tablet 100 mg  Status:  Discontinued        100 mg  Oral Every 12 hours 01/19/21 0903 01/22/21 0759   01/17/21 1830  doxycycline (VIBRAMYCIN) 100 mg in sodium chloride 0.9 % 250 mL IVPB  Status:  Discontinued        100 mg 125 mL/hr over 120 Minutes Intravenous Every 12 hours 01/17/21 1730 01/19/21 0903   01/17/21 1830  meropenem (MERREM) 1 g in sodium chloride 0.9 % 100 mL IVPB        1 g 200 mL/hr over 30 Minutes Intravenous Every 8 hours 01/17/21 1757 01/22/21 1222       Assessment/Plan Encephalopathy/slurred speech/visual hallucinations Incidental small right parietal stroke  Vitamin B12 deficiency Urinary retention Chronic systolic and diastolic heart failure CAD-s/p PCI to LAD in 2007 HTN HLD DM Obesity Chronic pain  Mild leukocytosis, tachycardia, low grade fevers ?Cholecystitis  - Patient has no abdominal complaints including pain, nausea, or vomiting. He is completely nontender on abdominal exam and has been tolerating a  dysphagia diet. HIDA scan reports possible chronic cholecystitis; gallbladder not visualized at 60 minutes but patient refused additional imaging therefore patency of the cystic duct not confirmed. Low suspicion for acute cholecystitis at this time. Will discuss with MD but I do not that that he would benefit from cholecystectomy at this time. He may have a component of chronic cholecystitis, but again does not seem to be symptomatic. If he were to develop abdominal pain or issues with PO intake please let us know.  ID - none currently VTE - lovenox FEN - D3 diet Foley - in place  Wellington Hampshire, [facility] 01/24/2021, 3:29 PM Please see Amion for pager number during day hours 7:00am-4:30pm

## 2021-01-24 NOTE — Progress Notes (Signed)
Inpatient Rehab Admissions Coordinator:   Pt. More alert this AM, wife in room. Pt. States that she would prefer CIR for patient but is amenable to SNF if that is not an option. I do not have insurance authorization for CIR at this time and most recent therapy notes recommend SNF. Pt. Is scheduled to see PT/OT today, if they recommend CIR, I will pursue insurance auth and potential admission.  Clemens Catholic, [place], [place] Admissions Coordinator  [phone] ([place]) [phone] (office)

## 2021-01-24 NOTE — Progress Notes (Signed)
PT Cancellation Note  Patient Details Name: Vincent [name] MRN: [id number] DOB: Sep 01, 1951   Cancelled Treatment:    Reason Eval/Treat Not Completed: Patient at procedure or test/unavailable. Will check back as time allows.   Leighton Roach, [place]  Pager [phone] Office [place] 01/24/2021, 9:49 AM

## 2021-01-24 NOTE — Progress Notes (Signed)
PROGRESS NOTE        PATIENT DETAILS Name: Vincent [name] Age: 70 y.o. Sex: male Date of Birth: [birth year] Admit Date: 01/12/2021 Admitting Physician Lequita Halt, MD JWJ:XBJY, Emily Filbert., MD  Brief Narrative: Patient is a 70 y.o. male with history of insulin-dependent DM-2, HTN, CAD, chronic systolic heart failure, chronic pain syndrome on narcotics/benzos-presented with a 2-week history of worsening confusion, visual hallucinations and frequent falls.  Spouse reports slurred speech for a few days as well.  Patient was subsequently admitted to the hospitalist service for further evaluation and treatment.  Significant events: 4/7>> admit for evaluation of slurred speech, confusion, hallucination, frequent falls  Significant studies: 4/7>> CT head: Negative for acute abnormalities 4/7>> CT C-spine: No fracture 4/7>> x-ray right knee: Right knee replacement without hardware loosening or fracture 4/8>> Echo: EF 45-50%, apical akinesis, grade 1 diastolic dysfunction.  Swirling of contrast at apex consistent with low flow but no thrombus, +ve WMA. 4/8>> vitamin B12: 72 4/10 >> MRI Brain and C Spine - Small subacute infarct in the right parietal white matter, likely a current manifestation of the patient's extensive chronic small vessel ischemia, Moderate right neuroforaminal stenosis at C3-C4.  4/17 >> CT Chest - Abd-Pelvis with IV contrast - ? GB edema, constipation 4/17 - Leg [place] - no DVT  Antimicrobial therapy: None  Microbiology data: None  Procedures : None  Consults: Neurology x 2  DVT Prophylaxis : enoxaparin (LOVENOX) injection 40 mg Start: 01/13/21 1000   Subjective: Patient in bed more awake and alert today speech is better, denies any headache chest or abdominal pain, no shortness of breath.  Wants to go home and play with his dogs.   Assessment/Plan:  Encephalopathy/slurred speech/visual hallucinations: Probable polypharmacy-but need  to rule out CVA due to slurred speech-echo findings (smoke seen)-vitamin B12 deficiency may be contributing as well-continue to hold benzos/narcotics-increased Seroquel to 50 mg twice daily-use Haldol as needed.  Clinically improving, MRI brain noted and DW Neuro 01/15/21, neurological changes likely due to prolonged B12 deficiency, according to the wife symptoms have been gradually progressive over several months.  Stable EEG.  Neurology following, SNF vs CIR. Continue a.m. low-dose Seroquel.  Incidental small right parietal stroke noted.  He is on aspirin and statin, no deficits contributed to this, continue to monitor.  May require placement.  LDL under 70, A1c mildly elevated.  Vitamin B12 deficiency: On B12 supplementation-recheck vitamin B12 levels in 3 months.  Persistent tachycardia with mild leukocytosis with low-grade fevers due to aspiration pneumonia in the hospital and Ur. Retention.   Stable TSH x2, chest x-ray positive sided infiltrate, has been appropriately treated with antibiotics for aspiration and then for possible UTI, for urinary retention he has Flomax and Foley catheter in place.  CT scan chest abdomen pelvis inconclusive, right upper quadrant inconclusive, obtaining HIDA scan to rule out any gallbladder pathology contributing to his tachycardia and low-grade fevers.  Exam is benign.  Urinary retention.  Foley and Flomax 01/21/21 - UA borderline, he was already on meropenem will extend course for 2 more days to stop on 01/25/2017.  Combined chronic systolic and diastolic heart failure EF around 50%: Euvolemic- on beta-blocker.  CAD-s/p PCI to LAD in 2007: No anginal symptoms-has EF 45% with apical akinesis on echo-most recent nuclear stress test in 2017 showed a fixed defect in the apex stable  for outpatient follow-up with primary cardiologist.  Remains on aspirin. Will need outpt Cards follow up in 7-10 days post DC.  HTN: Blood pressure controlled-continue present regimen.   Holding ACE inhibitor due to AKI.  HLD: Continue statin-LDL 60.  Mild clinical dehydration with hyponatremia and AKI.  AKI and hyponatremia resolved after IV fluids.  Constipation.  received enema and continue bowel regimen.  DM-2 (A1c 7.2 on 4/7): CBG stable with SSI-hold all oral hypoglycemic agents  Recent Labs    01/23/21 1735 01/23/21 2116 01/24/21 0622  GLUCAP 156* 150* 159*    Obesity: Estimated body mass index is 31.61 kg/m as calculated from the following:   Height as of this encounter: 5\' 8"  (1.727 m).   Weight as of this encounter: 94.3 kg.   Diet: Diet Order            Diet NPO time specified  Diet effective midnight                  Code Status: Full code   Family Communication:  [id number] updated on 01/14/21, message left at 10 AM on 01/15/2021, 01/16/21 updated, updated bedside 01/17/21, 01/24/2021  Disposition Plan: Status is: Inpatient  Remains inpatient appropriate because:Inpatient level of care appropriate due to severity of illness   Dispo: The patient is from: Home              Anticipated d/c is to: Home              Patient currently is not medically stable to d/c.   Difficult to place patient No   Barriers to Discharge: Ongoing altered mental status-awaiting further work-up-see above  Antimicrobial agents: Anti-infectives (From admission, onward)   Start     Dose/Rate Route Frequency Ordered Stop   01/22/21 1030  meropenem (MERREM) 1 g in sodium chloride 0.9 % 100 mL IVPB        1 g 200 mL/hr over 30 Minutes Intravenous Every 8 hours 01/22/21 0932 01/24/21 0258   01/19/21 2200  doxycycline (VIBRA-TABS) tablet 100 mg  Status:  Discontinued        100 mg Oral Every 12 hours 01/19/21 0903 01/22/21 0759   01/17/21 1830  doxycycline (VIBRAMYCIN) 100 mg in sodium chloride 0.9 % 250 mL IVPB  Status:  Discontinued        100 mg 125 mL/hr over 120 Minutes Intravenous Every 12 hours 01/17/21 1730 01/19/21 0903   01/17/21 1830   meropenem (MERREM) 1 g in sodium chloride 0.9 % 100 mL IVPB        1 g 200 mL/hr over 30 Minutes Intravenous Every 8 hours 01/17/21 1757 01/22/21 1222       Time spent:  35 minutes-Greater than 50% of this time was spent in counseling, explanation of diagnosis, planning of further management, and coordination of care.  MEDICATIONS:  Scheduled Meds: .  stroke: mapping our early stages of recovery book   Does not apply Once  . amLODipine  10 mg Oral Daily  . aspirin EC  81 mg Oral Daily  . bisacodyl  10 mg Rectal Daily  . Chlorhexidine Gluconate Cloth  6 each Topical Daily  . cyanocobalamin  1,000 mcg Subcutaneous Q30 days  . docusate sodium  200 mg Oral BID  . enoxaparin (LOVENOX) injection  40 mg Subcutaneous Q24H  . insulin aspart  0-15 Units Subcutaneous TID WC  . isosorbide mononitrate  30 mg Oral Daily  . metoprolol tartrate  100 mg Oral  BID  . multivitamin with minerals  1 tablet Oral Daily  . polyethylene glycol  17 g Oral BID  . QUEtiapine  25 mg Oral BID  . simvastatin  40 mg Oral QHS  . tamsulosin  0.4 mg Oral Daily  . thiamine  100 mg Oral Daily   Continuous Infusions: . lactated ringers 75 mL/hr at 01/24/21 0747   PRN Meds:.acetaminophen **OR** [DISCONTINUED] acetaminophen (TYLENOL) oral liquid 160 mg/5 mL **OR** [DISCONTINUED] acetaminophen, haloperidol lactate, hydrALAZINE, LORazepam, metoprolol tartrate, senna-docusate   PHYSICAL EXAM: Vital signs: Vitals:   01/23/21 1929 01/23/21 2114 01/23/21 2323 01/24/21 0325  BP: 134/74 (!) 143/81 122/73 (!) 141/69  Pulse: (!) 109 (!) 109 93 (!) 104  Resp: 18  18 18   Temp: 98.3 F (36.8 C)  99 F (37.2 C) 98.4 F (36.9 C)  TempSrc: Axillary  Axillary Oral  SpO2: 97%  98% 99%  Weight:      Height:       Filed Weights   01/17/21 1749  Weight: 94.3 kg   Body mass index is 31.61 kg/m.   Gen Exam:   Patient in bed much more awake and alert today, in no distress, speech is more clear this morning, he remains  extremely weak in both lower extremities,   [place].AT,PERRAL Supple Neck,No JVD, No cervical lymphadenopathy appriciated.  Symmetrical Chest wall movement, Good air movement bilaterally, CTAB RRR,No Gallops, Rubs or new Murmurs, No Parasternal Heave +ve B.Sounds, Abd Soft, No tenderness, No organomegaly appriciated, No rebound - guarding or rigidity, foley in place No Cyanosis,      I have personally reviewed following labs and imaging studies  LABORATORY DATA:  Recent Labs  Lab 01/20/21 0239 01/21/21 0218 01/22/21 0115 01/23/21 0506 01/24/21 0353  WBC 11.9* 11.9* 10.6* 11.5* 11.4*  HGB 14.7 14.0 13.5 13.9 13.7  HCT 42.1 40.5 40.5 39.6 40.1  PLT 219 228 216 230 177  MCV 88.6 88.0 91.6 87.8 89.9  MCH 30.9 30.4 30.5 30.8 30.7  MCHC 34.9 34.6 33.3 35.1 34.2  RDW 13.3 13.4 13.2 13.2 13.2  LYMPHSABS 1.5 2.0 1.3 1.6 1.6  MONOABS 1.4* 1.6* 1.1* 1.4* 1.3*  EOSABS 0.0 0.1 0.1 0.2 0.3  BASOSABS 0.0 0.0 0.0 0.0 0.0    Recent Labs  Lab 01/20/21 0239 01/21/21 0218 01/22/21 0115 01/23/21 0506 01/23/21 1026 01/24/21 0353  NA 131* 134* 132* 131*  --  136  K 4.0 3.8 4.2 3.9  --  4.3  CL 105 102 101 99  --  102  CO2 18* 22 21* 22  --  23  GLUCOSE 176* 158* 185* 156*  --  143*  BUN 18 15 17 11   --  12  CREATININE 0.82 0.75 0.67 0.68  --  0.67  CALCIUM 9.5 9.5 9.2 9.0  --  9.1  AST 31 30 39 25  --  49*  ALT 14 18 22 19   --  29  ALKPHOS 95 93 89 89  --  87  BILITOT 1.7* 1.3* 1.3* 1.3*  --  1.1  ALBUMIN 2.9* 2.7* 2.5* 2.2*  --  2.1*  MG 2.1 2.1 2.0 2.1  --  2.2  PROCALCITON <0.10 <0.10 <0.10 0.14  --  <0.10  TSH  --   --   --   --  1.418  --   BNP 27.6 42.5 30.6 62.5  --  242.2*    Recent Labs  Lab 01/20/21 0239 01/21/21 0218 01/22/21 0115 01/23/21 0506 01/24/21 0353  WBC  11.9* 11.9* 10.6* 11.5* 11.4*  HGB 14.7 14.0 13.5 13.9 13.7  HCT 42.1 40.5 40.5 39.6 40.1  PLT 219 228 216 230 177  BNP 27.6 42.5 30.6 62.5 242.2*  PROCALCITON <0.10 <0.10 <0.10 0.14 <0.10  AST 31 30  39 25 49*  ALT 14 18 22 19 29   ALKPHOS 95 93 89 89 87  BILITOT 1.7* 1.3* 1.3* 1.3* 1.1  ALBUMIN 2.9* 2.7* 2.5* 2.2* 2.1*      Urine analysis:    Component Value Date/Time   COLORURINE AMBER (A) 01/23/2021 1201   APPEARANCEUR HAZY (A) 01/23/2021 1201   LABSPEC 1.029 01/23/2021 1201   PHURINE 5.0 01/23/2021 1201   GLUCOSEU >=500 (A) 01/23/2021 1201   HGBUR SMALL (A) 01/23/2021 1201   BILIRUBINUR NEGATIVE 01/23/2021 1201   KETONESUR 80 (A) 01/23/2021 1201   PROTEINUR 30 (A) 01/23/2021 1201   UROBILINOGEN 0.2 01/06/2014 1257   NITRITE NEGATIVE 01/23/2021 1201   LEUKOCYTESUR TRACE (A) 01/23/2021 1201    Sepsis Labs: Lactic Acid, Venous No results found for: LATICACIDVEN  MICROBIOLOGY: Recent Results (from the past 240 hour(s))  Culture, Urine     Status: Abnormal   Collection Time: 01/17/21  6:16 PM   Specimen: Urine, Random  Result Value Ref Range Status   Specimen Description URINE, RANDOM  Final   Special Requests   Final    NONE Performed at [facility], [place] [address], [place], [place] [address]    Culture (A)  Final    >=100,000 COLONIES/mL MULTIPLE SPECIES PRESENT, SUGGEST RECOLLECTION   Report Status 01/19/2021 FINAL  Final  Culture, blood (routine x 2)     Status: None   Collection Time: 01/17/21  7:39 PM   Specimen: BLOOD RIGHT HAND  Result Value Ref Range Status   Specimen Description BLOOD RIGHT HAND  Final   Special Requests   Final    BOTTLES DRAWN AEROBIC AND ANAEROBIC Blood Culture adequate volume   Culture   Final    NO GROWTH 5 DAYS Performed at [facility], [place] [address] [place], [place] [address]    Report Status 01/22/2021 FINAL  Final  Culture, blood (routine x 2)     Status: None   Collection Time: 01/17/21  7:42 PM   Specimen: BLOOD  Result Value Ref Range Status   Specimen Description BLOOD RIGHT ANTECUBITAL  Final   Special Requests   Final    BOTTLES DRAWN AEROBIC AND ANAEROBIC Blood Culture adequate volume   Culture    Final    NO GROWTH 5 DAYS Performed at [facility], [place] [address] [place], [place] [address]    Report Status 01/22/2021 FINAL  Final  Culture, Urine     Status: None   Collection Time: 01/21/21  7:44 AM   Specimen: Urine, Catheterized  Result Value Ref Range Status   Specimen Description URINE, CATHETERIZED  Final   Special Requests NONE  Final   Culture   Final    NO GROWTH Performed at [facility], [address], [place], [place] [address]    Report Status 01/22/2021 FINAL  Final    RADIOLOGY STUDIES/RESULTS: CT CHEST ABDOMEN PELVIS W CONTRAST  Result Date: 01/22/2021 CLINICAL DATA:  Chest pain or shortness of breath. Sepsis. Effusion suspected. EXAM: CT CHEST, ABDOMEN, AND PELVIS WITH CONTRAST TECHNIQUE: Multidetector CT imaging of the chest, abdomen and pelvis was performed following the standard protocol during bolus administration of intravenous contrast. CONTRAST:  129mL  OMNIPAQUE IOHEXOL 300 MG/ML  SOLN COMPARISON:  Plain film chest of 1 day prior. [place] 517 CTA chest. No prior abdominopelvic CTs. FINDINGS: CT CHEST FINDINGS Cardiovascular: Mild motion degradation throughout. Exam also degraded by patient arm position, not raised above the head. Right subclavian artery stent or calcification. Aortic atherosclerosis. Mild cardiomegaly, without pericardial effusion. Multivessel coronary artery atherosclerosis. No central pulmonary embolism, on this non-dedicated study. Mediastinum/Nodes: No mediastinal or hilar adenopathy. Lungs/Pleura: No pleural fluid.  Mild centrilobular emphysema. Musculoskeletal: No acute osseous abnormality. CT ABDOMEN PELVIS FINDINGS Hepatobiliary: Normal liver. The gallbladder is mildly distended with suggestion of pericholecystic edema, including on 62/3 and 16/8. No calcified stone or biliary duct dilatation. Pancreas: Normal, without mass or ductal dilatation. Spleen: Normal in size, without focal abnormality. Adrenals/Urinary Tract: Mild left  adrenal thickening. Normal right adrenal gland. Interpolar right renal 1.8 cm lesion measures on the order of 19 HU, favoring a minimally complex cyst. Inter/upper pole punctate right renal collecting system calculus. No hydronephrosis. Foley catheter within the urinary bladder. Stomach/Bowel: Normal stomach, without wall thickening. Moderate stool within the rectum, less so the remainder of the colon. Normal terminal ileum and appendix. Normal small bowel. Vascular/Lymphatic: Aortic atherosclerosis. No abdominopelvic adenopathy. Reproductive: Normal prostate. Other: No significant free fluid. No free intraperitoneal air. Right paramidline fat containing ventral pelvic wall hernia. Presumably iatrogenic subcutaneous air within the anterior pelvic wall. Musculoskeletal: Right hip osteoarthritis. Degenerative disc disease at the lumbosacral junction, presuming a transitional S1 vertebral body. IMPRESSION: 1. Multifactorial degradation, including motion and patient arm position. 2. Mildly distended gallbladder with suggestion of pericholecystic edema. Correlate with right upper quadrant symptoms and consider ultrasound. 3. Right nephrolithiasis. 4. Possible constipation or even fecal impaction. 5. Aortic Atherosclerosis (ICD10-I70.0) and Emphysema (ICD10-J43.9). Coronary artery atherosclerosis. Electronically Signed   By: Abigail Miyamoto M.D.   On: 01/22/2021 15:03   EEG adult  Result Date: 01/23/2021 Lora Havens, MD     01/23/2021 10:18 AM Patient Name: LEMOYNE NESTOR MRN: [id number] Epilepsy Attending: Lora Havens Referring Physician/Provider: Dr Lala Lund Date: 01/23/2021 Duration: 22.30 mins Patient history: 70 year old male with altered mental status.  EEG to evaluate for seizures. Level of alertness: lethargic, asleep AEDs during EEG study: None Technical aspects: This EEG study was done with scalp electrodes positioned according to the 10-20 International system of electrode placement. Electrical  activity was acquired at a sampling rate of 500Hz  and reviewed with a high frequency filter of 70Hz  and a low frequency filter of 1Hz . EEG data were recorded continuously and digitally stored. Description: No clear posterior dominant rhythm was seen. Sleep was characterized by vertex waves, sleep spindles (12 to 14 Hz), maximal frontocentral region. EEG showed continuous generalized 5 to 7 Hz theta as well as intermittent 2-3Hz  delta slowing. Physiologic photic driving was not seen during photic stimulation. Hyperventilation was not performed.   ABNORMALITY - Continuous slow, generalized IMPRESSION: This study is suggestive of moderate diffuse encephalopathy, nonspecific etiology. No seizures or epileptiform discharges were seen throughout the recording. Priyanka O Yadav   VAS [place] LOWER EXTREMITY VENOUS (DVT)  Result Date: 01/23/2021  Lower Venous DVT Study Indications: Fever.  Limitations: Altered mental status, patient in restraints. Comparison Study: No prior study Performing Technologist: Sharion Dove RVS  Examination Guidelines: A complete evaluation includes B-mode imaging, spectral Doppler, color Doppler, and power Doppler as needed of all accessible portions of each vessel. Bilateral testing is considered an integral part of a complete examination. Limited examinations for reoccurring indications may be performed  as noted. The reflux portion of the exam is performed with the patient in reverse Trendelenburg.  +---------+---------------+---------+-----------+----------+--------------+ RIGHT    CompressibilityPhasicitySpontaneityPropertiesThrombus Aging +---------+---------------+---------+-----------+----------+--------------+ CFV      Full           Yes      Yes                                 +---------+---------------+---------+-----------+----------+--------------+ SFJ      Full                                                         +---------+---------------+---------+-----------+----------+--------------+ FV Prox  Full                                                        +---------+---------------+---------+-----------+----------+--------------+ FV Mid   Full                                                        +---------+---------------+---------+-----------+----------+--------------+ FV DistalFull                                                        +---------+---------------+---------+-----------+----------+--------------+ PFV      Full                                                        +---------+---------------+---------+-----------+----------+--------------+ POP      Full           Yes      Yes                                 +---------+---------------+---------+-----------+----------+--------------+ PTV      Full                                                        +---------+---------------+---------+-----------+----------+--------------+ PERO     Full                                                        +---------+---------------+---------+-----------+----------+--------------+   +---------+---------------+---------+-----------+----------+--------------+ LEFT     CompressibilityPhasicitySpontaneityPropertiesThrombus Aging +---------+---------------+---------+-----------+----------+--------------+ CFV      Full           Yes  Yes                                 +---------+---------------+---------+-----------+----------+--------------+ SFJ      Full                                                        +---------+---------------+---------+-----------+----------+--------------+ FV Prox  Full                                                        +---------+---------------+---------+-----------+----------+--------------+ FV Mid   Full                                                         +---------+---------------+---------+-----------+----------+--------------+ FV DistalFull                                                        +---------+---------------+---------+-----------+----------+--------------+ PFV      Full                                                        +---------+---------------+---------+-----------+----------+--------------+ POP      Full           Yes      Yes                                 +---------+---------------+---------+-----------+----------+--------------+ PTV      Full                                                        +---------+---------------+---------+-----------+----------+--------------+ PERO     Full                                                        +---------+---------------+---------+-----------+----------+--------------+     Summary: RIGHT: - There is no evidence of deep vein thrombosis in the lower extremity. However, portions of this examination were limited- see technologist comments above.  LEFT: - There is no evidence of deep vein thrombosis in the lower extremity. However, portions of this examination were limited- see technologist comments above.  *See table(s) above for measurements and observations. Electronically signed by Ruta Hinds MD on 01/23/2021 at 12:52:59 PM.  Final    US Abdomen Limited RUQ (LIVER/GB)  Result Date: 01/22/2021 CLINICAL DATA:  Abdominal pain and suspicious findings on recent CT examination. EXAM: ULTRASOUND ABDOMEN LIMITED RIGHT UPPER QUADRANT COMPARISON:  CT from earlier in the same day. FINDINGS: Gallbladder: Gallbladder is well distended with sludge within. No pericholecystic fluid is noted. Mild wall thickening is noted to 5 mm. No cholelithiasis is seen. Common bile duct: Diameter: 6.2 mm. Liver: Mild fatty infiltration of the liver is noted. Portal vein is patent on color Doppler imaging with normal direction of blood flow towards the liver. Other: None. IMPRESSION:  Gallbladder sludge without evidence of cholelithiasis. Some wall thickening is noted. These changes could represent some chronic cholecystitis. HIDA scan may be helpful in this regard. Electronically Signed   By: Inez Catalina M.D.   On: 01/22/2021 20:40     LOS: 12 days   Lala Lund, MD  [facility]  01/24/2021, 8:54 AM

## 2021-01-24 NOTE — Progress Notes (Signed)
Occupational Therapy Treatment Patient Details Name: Vincent [name] MRN: [id number] DOB: 1951/06/28 Today's Date: 01/24/2021    History of present illness 70 y/o male presented to ED at [facility] on 4/7 after being sent to [facility] for emergent MRI but unable to get to car. 2 weeks prior, patient had syncopal episode at PCP and has had 16 falls since. Patient presents with slurred speech, weakness, and confusion for one month. Unable to obtain MRI secondary to agitation. CT head and c-spine show chronic microvascular disease and severe C6 degenerative changes without acute process. PMH: anxiety, CAD, depression, DM type 2, HLD, HTN   OT comments  Pt seen in conjunction with PT to maximize pts activity tolerance however pt continues to present with decreased activity tolerance, generalized deconditioning and impaired cognition. Pt received in recliner as nursing staff had maximoved pt to recliner. Attempted several sit<>stands from recliner with stedy however pt unable to power into standing. Worked on functional weight shifts from recliner with pt reaching to stedy with BUEs as precursor to higher level functional mobility tasks. Pt would continue to benefit from skilled occupational therapy while admitted and after d/c to address the below listed limitations in order to improve overall functional mobility and facilitate independence with BADL participation. DC plan remains appropriate, will follow acutely per POC.    Follow Up Recommendations  SNF;Supervision/Assistance - 24 hour    Equipment Recommendations  3 in 1 bedside commode    Recommendations for Other Services      Precautions / Restrictions Precautions Precautions: Fall Precaution Comments: wrist restraints Restrictions Weight Bearing Restrictions: No       Mobility Bed Mobility               General bed mobility comments: received in recliner with maximove pad behind him    Transfers Overall transfer level: Needs  assistance Equipment used: Ambulation equipment used Transfers: Sit to/from Stand Sit to Stand: +2 physical assistance;Mod assist         General transfer comment: pt attempted sit>stand in stedy 2-3x with mod A +2 given but pt unable to unweight buttocks and with minimal pulling from UE's. Pt with generalized weakness but larger issue seemed to be motor planning and internal distraction.    Balance Overall balance assessment: Needs assistance Sitting-balance support: Bilateral upper extremity supported;Feet supported Sitting balance-Leahy Scale: Poor Sitting balance - Comments: L lean in sitting with frequent need for max A to return to midline Postural control: Posterior lean;Left lateral lean     Standing balance comment: unable to achieve standing                           ADL either performed or assessed with clinical judgement   ADL Overall ADL's : Needs assistance/impaired                                       General ADL Comments: session work on functional weights shifts with stedy positioned in front of pt. pt continues to present with decreased activity tolerance, impaired cognition and generalized weakness     Vision       Perception     Praxis      Cognition Arousal/Alertness: Awake/alert Behavior During Therapy: Agitated Overall Cognitive Status: Impaired/Different from baseline Area of Impairment: Attention;Memory;Following commands;Safety/judgement;Awareness;Problem solving;Orientation  Orientation Level: Disoriented to;Place;Time;Situation Current Attention Level: Focused Memory: Decreased recall of precautions;Decreased short-term memory Following Commands: Follows one step commands inconsistently;Follows one step commands with increased time Safety/Judgement: Decreased awareness of safety;Decreased awareness of deficits Awareness: Intellectual Problem Solving: Decreased initiation;Difficulty  sequencing;Requires verbal cues;Requires tactile cues;Slow processing General Comments: Intermittentle agitated throughout session with inappropriate comments and gestures (making faces behind RN's back and then later, therapist's, when he was frustrated). Asked to go to the chair when he was already there. Complete lack of awareness that he is unable to stand or walk, even after assisting him with transfer attempts        Exercises     Shoulder Instructions       General Comments SPO2 94% on 2L O2. Pt needs continual redirection to task but not easily redirected.    Pertinent Vitals/ Pain       Pain Assessment: No/denies pain Pain Score: 0-No pain  Home Living                                          Prior Functioning/Environment              Frequency  Min 2X/week        Progress Toward Goals  OT Goals(current goals can now be found in the care plan section)  Progress towards OT goals: Not progressing toward goals - comment (increased assist needed)  Acute Rehab OT Goals Patient Stated Goal: none stated OT Goal Formulation: With patient Time For Goal Achievement: 01/28/21 Potential to Achieve Goals: [place] Discharge plan remains appropriate;Frequency remains appropriate    Co-evaluation      Reason for Co-Treatment: Complexity of the patient's impairments (multi-system involvement);Necessary to address cognition/behavior during functional activity;For patient/therapist safety PT goals addressed during session: Strengthening/ROM;Mobility/safety with mobility        AM-PAC OT "6 Clicks" Daily Activity     Outcome Measure   Help from another person eating meals?: A Little Help from another person taking care of personal grooming?: A Lot Help from another person toileting, which includes using toliet, bedpan, or urinal?: Total Help from another person bathing (including washing, rinsing, drying)?: A Lot Help from another person to put  on and taking off regular upper body clothing?: A Lot Help from another person to put on and taking off regular lower body clothing?: Total 6 Click Score: 11    End of Session Equipment Utilized During Treatment: Gait belt;Other (comment) (stedy)  OT Visit Diagnosis: Unsteadiness on feet (R26.81);Muscle weakness (generalized) (M62.81);Other symptoms and signs involving cognitive function   Activity Tolerance Patient tolerated treatment well   Patient Left in chair;with call bell/phone within reach;with chair alarm set;with restraints reapplied   Nurse Communication Mobility status        Time: 1413-1445 OT Time Calculation (min): 32 min  Charges: OT General Charges $OT Visit: 1 Visit OT Treatments $Therapeutic Activity: 8-22 mins  Harley Alto., COTA/L Acute Rehabilitation Services [place] 01/24/2021, 4:18 PM

## 2021-01-24 NOTE — Progress Notes (Signed)
Physical Therapy Treatment Patient Details Name: Vincent [name] MRN: [id number] DOB: 07/14/1951 Today's Date: 01/24/2021    History of Present Illness 70 y/o male presented to ED at [facility] on 4/7 after being sent to [facility] for emergent MRI but unable to get to car. 2 weeks prior, patient had syncopal episode at PCP and has had 16 falls since. Patient presents with slurred speech, weakness, and confusion for one month. Unable to obtain MRI secondary to agitation. CT head and c-spine show chronic microvascular disease and severe C6 degenerative changes without acute process. PMH: anxiety, CAD, depression, DM type 2, HLD, HTN    PT Comments    Pt received by PT/OT in chair and initially agreeable to treatment but very distracted throughout session with decreased ability to attend to tasks and commands. Worked on core activation from sitting reclined to sitting edge of chair. Pt verbalizes that he can get up and walk, allowed to attempt sit to stand multiple times with use of stedy with mod A +2. Unable to clear buttocks from seat or properly motor plan to fwd wt shift. Feel that at this point pt will not be able to work with CIR at level required. Keeping recommendation for SNF. PT will continue to follow.    Follow Up Recommendations  SNF;Supervision/Assistance - 24 hour     Equipment Recommendations  None recommended by PT    Recommendations for Other Services       Precautions / Restrictions Precautions Precautions: Fall Precaution Comments: wrist restraints Restrictions Weight Bearing Restrictions: No    Mobility  Bed Mobility               General bed mobility comments: received in recliner with maximove pad behind him    Transfers Overall transfer level: Needs assistance Equipment used: Ambulation equipment used Transfers: Sit to/from Stand Sit to Stand: +2 physical assistance;Mod assist         General transfer comment: pt attempted sit>stand in stedy 2-3x  with mod A +2 given but pt unable to unweight buttocks and with minimal pulling from UE's. Pt with generalized weakness but larger issue seemed to be motor planning and internal distraction.  Ambulation/Gait                 Stairs             Wheelchair Mobility    Modified Rankin (Stroke Patients Only) Modified Rankin (Stroke Patients Only) Pre-Morbid Rankin Score: Moderate disability Modified Rankin: Severe disability     Balance Overall balance assessment: Needs assistance Sitting-balance support: Bilateral upper extremity supported;Feet supported Sitting balance-Leahy Scale: Poor Sitting balance - Comments: L lean in sitting with frequent need for max A to return to midline Postural control: Posterior lean;Left lateral lean     Standing balance comment: unable to achieve standing                            Cognition Arousal/Alertness: Awake/alert Behavior During Therapy: Agitated Overall Cognitive Status: Impaired/Different from baseline Area of Impairment: Attention;Memory;Following commands;Safety/judgement;Awareness;Problem solving;Orientation                 Orientation Level: Disoriented to;Place;Time;Situation Current Attention Level: Focused Memory: Decreased recall of precautions;Decreased short-term memory Following Commands: Follows one step commands inconsistently;Follows one step commands with increased time Safety/Judgement: Decreased awareness of safety;Decreased awareness of deficits Awareness: Intellectual Problem Solving: Decreased initiation;Difficulty sequencing;Requires verbal cues;Requires tactile cues;Slow processing General Comments: Intermittentle agitated throughout  session with inappropriate comments and gestures (making faces behind RN's back and then later, therapist's, when he was frustrated). Asked to go to the chair when he was already there. Complete lack of awareness that he is unable to stand or walk, even  after assisting him with transfer attempts      Exercises      General Comments General comments (skin integrity, edema, etc.): SPO2 94% on 2L O2. Pt needs continual redirection to task but not easily redirected.      Pertinent Vitals/Pain Pain Assessment: No/denies pain Pain Score: 0-No pain    Home Living                      Prior Function            PT Goals (current goals can now be found in the care plan section) Acute Rehab PT Goals Patient Stated Goal: none stated PT Goal Formulation: With patient/family Time For Goal Achievement: 01/28/21 Potential to Achieve Goals: Fair Progress towards PT goals: Not progressing toward goals - comment (unable to focus on therapy session)    Frequency    Min 3X/week      PT Plan Current plan remains appropriate    Co-evaluation PT/OT/SLP Co-Evaluation/Treatment: Yes Reason for Co-Treatment: Complexity of the patient's impairments (multi-system involvement);Necessary to address cognition/behavior during functional activity;For patient/therapist safety PT goals addressed during session: Strengthening/ROM;Mobility/safety with mobility        AM-PAC PT "6 Clicks" Mobility   Outcome Measure  Help needed turning from your back to your side while in a flat bed without using bedrails?: A Lot Help needed moving from lying on your back to sitting on the side of a flat bed without using bedrails?: Total Help needed moving to and from a bed to a chair (including a wheelchair)?: Total Help needed standing up from a chair using your arms (e.g., wheelchair or bedside chair)?: Total Help needed to walk in hospital room?: Total Help needed climbing 3-5 steps with a railing? : Total 6 Click Score: 7    End of Session Equipment Utilized During Treatment: Oxygen Activity Tolerance: Treatment limited secondary to agitation Patient left: with call bell/phone within reach;with restraints reapplied;in chair;with chair alarm  set Nurse Communication: Mobility status PT Visit Diagnosis: Unsteadiness on feet (R26.81);Muscle weakness (generalized) (M62.81);Repeated falls (R29.6);Other abnormalities of gait and mobility (R26.89)     Time: 4163-8453 PT Time Calculation (min) (ACUTE ONLY): 32 min  Charges:  $Therapeutic Activity: 8-22 mins                     Leighton Roach, [place]  Pager [phone] Office [place] 01/24/2021, 3:28 PM

## 2021-01-25 DIAGNOSIS — R110 Nausea: Secondary | ICD-10-CM

## 2021-01-25 LAB — CBC WITH DIFFERENTIAL/PLATELET
Abs Immature Granulocytes: 0.1 10*3/uL — ABNORMAL HIGH (ref 0.00–0.07)
Basophils Absolute: 0 10*3/uL (ref 0.0–0.1)
Basophils Relative: 0 %
Eosinophils Absolute: 0.4 10*3/uL (ref 0.0–0.5)
Eosinophils Relative: 3 %
HCT: 37.6 % — ABNORMAL LOW (ref 39.0–52.0)
Hemoglobin: 12.7 g/dL — ABNORMAL LOW (ref 13.0–17.0)
Immature Granulocytes: 1 %
Lymphocytes Relative: 17 %
Lymphs Abs: 2.2 10*3/uL (ref 0.7–4.0)
MCH: 30.5 pg (ref 26.0–34.0)
MCHC: 33.8 g/dL (ref 30.0–36.0)
MCV: 90.2 fL (ref 80.0–100.0)
Monocytes Absolute: 1.6 10*3/uL — ABNORMAL HIGH (ref 0.1–1.0)
Monocytes Relative: 13 %
Neutro Abs: 8.1 10*3/uL — ABNORMAL HIGH (ref 1.7–7.7)
Neutrophils Relative %: 66 %
Platelets: 240 10*3/uL (ref 150–400)
RBC: 4.17 MIL/uL — ABNORMAL LOW (ref 4.22–5.81)
RDW: 13.1 % (ref 11.5–15.5)
WBC: 12.4 10*3/uL — ABNORMAL HIGH (ref 4.0–10.5)
nRBC: 0 % (ref 0.0–0.2)

## 2021-01-25 LAB — GLUCOSE, CAPILLARY
Glucose-Capillary: 152 mg/dL — ABNORMAL HIGH (ref 70–99)
Glucose-Capillary: 140 mg/dL — ABNORMAL HIGH (ref 70–99)
Glucose-Capillary: 174 mg/dL — ABNORMAL HIGH (ref 70–99)
Glucose-Capillary: 173 mg/dL — ABNORMAL HIGH (ref 70–99)
Glucose-Capillary: 155 mg/dL — ABNORMAL HIGH (ref 70–99)

## 2021-01-25 LAB — COMPREHENSIVE METABOLIC PANEL
ALT: 29 U/L (ref 0–44)
AST: 39 U/L (ref 15–41)
Albumin: 2.1 g/dL — ABNORMAL LOW (ref 3.5–5.0)
Alkaline Phosphatase: 93 U/L (ref 38–126)
Anion gap: 8 (ref 5–15)
BUN: 11 mg/dL (ref 8–23)
CO2: 25 mmol/L (ref 22–32)
Calcium: 8.9 mg/dL (ref 8.9–10.3)
Chloride: 99 mmol/L (ref 98–111)
Creatinine, Ser: 0.74 mg/dL (ref 0.61–1.24)
GFR, Estimated: >60 mL/min (ref >60)
Glucose, Bld: 154 mg/dL — ABNORMAL HIGH (ref 70–99)
Potassium: 4 mmol/L (ref 3.5–5.1)
Sodium: 132 mmol/L — ABNORMAL LOW (ref 135–145)
Total Bilirubin: 0.7 mg/dL (ref 0.3–1.2)
Total Protein: 5 g/dL — ABNORMAL LOW (ref 6.5–8.1)

## 2021-01-25 LAB — BRAIN NATRIURETIC PEPTIDE: B Natriuretic Peptide: 43.6 pg/mL (ref 0.0–100.0)

## 2021-01-25 LAB — MAGNESIUM: Magnesium: 2.3 mg/dL (ref 1.7–2.4)

## 2021-01-25 LAB — PROCALCITONIN: Procalcitonin: <0.1 ng/mL

## 2021-01-25 NOTE — Progress Notes (Signed)
Inpatient Rehab Admissions Coordinator:   PT/OT Continue to recommend SNF for this patient. I agree that pt. Is not likely to be able to participate in CIR level therapies. I will sign off. I notified pt.'s wife and she states they would like to pursue [facility] for short term rehab in SNF.   Clemens Catholic, [place], [place] Admissions Coordinator  [phone] ([place]) [phone] (office)

## 2021-01-25 NOTE — Progress Notes (Signed)
Physical Therapy Treatment Patient Details Name: Vincent [name] MRN: [id number] DOB: 1950-11-30 Today's Date: 01/25/2021    History of Present Illness 70 y/o male presented to ED at [facility] on 4/7 after being sent to [facility] for emergent MRI but unable to get to car. 2 weeks prior, patient had syncopal episode at PCP and has had 16 falls since. Patient presents with slurred speech, weakness, and confusion for one month. Unable to obtain MRI secondary to agitation. CT head and c-spine show chronic microvascular disease and severe C6 degenerative changes without acute process. PMH: anxiety, CAD, depression, DM type 2, HLD, HTN    PT Comments    Pt was seen for mobility on side of bed, and tried to assist pt to balance on sideof bed with no success.  Despite pt initially asking to get OOB, he became agitated and then had to assist back to bed.  He is demonstrating 93% sats on room air, and has good tolerance for movement with no clear distress.  Pt is mainly agitated and is having cogntive issues that contribute to his appearance.  Follow along with him to work on safety of movement with repetition, and with practice may be safer with automaticity.    Follow Up Recommendations  SNF     Equipment Recommendations  None recommended by PT    Recommendations for Other Services       Precautions / Restrictions Precautions Precautions: Fall Precaution Comments: wrist restraints Restrictions Weight Bearing Restrictions: No    Mobility  Bed Mobility Overal bed mobility: Needs Assistance Bed Mobility: Supine to Sit;Sit to Supine;Sit to Sidelying Rolling: Max assist Sidelying to sit: Max assist;+2 for physical assistance;+2 for safety/equipment     Sit to sidelying: Max assist;+2 for physical assistance;+2 for safety/equipment General bed mobility comments: pt became agitated when assisted to sitting and had to be helped back to bed    Transfers Overall transfer level: Needs  assistance Equipment used: 2 person hand held assist Transfers: Sit to/from Stand           General transfer comment: pt could not be assisted to stand and became upset with PT about getting OOB  Ambulation/Gait                 Stairs             Wheelchair Mobility    Modified Rankin (Stroke Patients Only)       Balance   Sitting-balance support: Bilateral upper extremity supported;Feet supported Sitting balance-Leahy Scale: Poor                                      Cognition Arousal/Alertness: Awake/alert Behavior During Therapy: Agitated Overall Cognitive Status: Impaired/Different from baseline Area of Impairment: Attention;Memory;Following commands;Safety/judgement;Awareness;Problem solving;Orientation                 Orientation Level: Place;Time;Situation Current Attention Level: Selective Memory: Decreased short-term memory Following Commands: Follows one step commands inconsistently;Follows one step commands with increased time Safety/Judgement: Decreased awareness of safety;Decreased awareness of deficits Awareness: Intellectual Problem Solving: Slow processing;Decreased initiation;Difficulty sequencing;Requires verbal cues;Requires tactile cues General Comments: Pt is confused about the PT purpose in mobility, initially asking to get up and then becoming agitated about being assissted by PT.  Asked nursing student to get PT to leave      Exercises      General Comments General comments (skin  integrity, edema, etc.): pt is up to sit but is not managing his personal safety and became agitated, nearly combative      Pertinent Vitals/Pain Pain Assessment: No/denies pain    Home Living                      Prior Function            PT Goals (current goals can now be found in the care plan section) Acute Rehab PT Goals Patient Stated Goal: none stated Progress towards PT goals: Not progressing toward  goals - comment    Frequency    Min 3X/week      PT Plan Current plan remains appropriate    Co-evaluation              AM-PAC PT "6 Clicks" Mobility   Outcome Measure  Help needed turning from your back to your side while in a flat bed without using bedrails?: A Lot Help needed moving from lying on your back to sitting on the side of a flat bed without using bedrails?: A Lot Help needed moving to and from a bed to a chair (including a wheelchair)?: Total Help needed standing up from a chair using your arms (e.g., wheelchair or bedside chair)?: Total Help needed to walk in hospital room?: Total Help needed climbing 3-5 steps with a railing? : Total 6 Click Score: 8    End of Session Equipment Utilized During Treatment: Oxygen Activity Tolerance: Treatment limited secondary to agitation Patient left: with call bell/phone within reach;with restraints reapplied;in chair;with chair alarm set Nurse Communication: Mobility status PT Visit Diagnosis: Muscle weakness (generalized) (M62.81);Difficulty in walking, not elsewhere classified (R26.2);Other abnormalities of gait and mobility (R26.89)     Time: 3235-5732 PT Time Calculation (min) (ACUTE ONLY): 21 min  Charges:  $Therapeutic Activity: 8-22 mins               Ramond Dial 01/25/2021, 8:08 PM Mee Hives, PT MS Acute Rehab Dept. Number: [place]

## 2021-01-25 NOTE — Progress Notes (Signed)
Restraints has been discontinued on this pt. Pt understands the reasoning for discontinuation and is agreeing with the safety guidelines. RN will continue to monitor.

## 2021-01-25 NOTE — Progress Notes (Signed)
PROGRESS NOTE    Vincent [name]  WIO:[id number] DOB: 1951-03-21 DOA: 01/12/2021 PCP: Ginger Organ., MD    Chief Complaint  Patient presents with  . Weakness    Brief Narrative: Patient is a 70 y.o. male with history of insulin-dependent DM-2, HTN, CAD, chronic systolic heart failure, chronic pain syndrome on narcotics/benzos-presented with a 2-week history of worsening confusion, visual hallucinations and frequent falls.  Spouse reports slurred speech for a few days as well.  Patient was subsequently admitted to the hospitalist service for further evaluation and treatment.   Assessment & Plan:   Active Problems:   CVA (cerebral vascular accident) ([place])   Acute encephalopathy Probably secondary to a combination of vitamin B12 deficiency, incidental small right parietal stroke and polypharmacy Patient's mental status is improving but not back to baseline yet as per the family at bedside patient has been progressive over several months. EEG does not show any active epileptiform activity. Neurology consulted and recommendations given. Therapy evaluations recommending SNF, TOC on board to assist. Vitamin B12 supplementations have been added. Patient currently does not have any FOCAL deficits at this time   Persistent tachycardia with mild leukocytosis and low-grade fevers Possibly secondary to aspiration pneumonia which has been completely treated with IV antibiotics. CT of the abdomen and pelvis shows some inconclusive gallbladder pathology. HIDA scan shows possibly chronic cholecystitis. General surgery consulted recommended no further intervention at this time as patient has been pain-free.    Combined chronic systolic and diastolic heart failure with left ventricular ejection fraction around 50% Patient is alert euvolemic and on beta-blocker.    Coronary artery disease s/p PCI to LAD, Patient currently denies any chest pain or shortness of breath Continue with  aspirin and recommend outpatient follow-up with cardiology on discharge.    Mild AKI Improving, possibly secondary to dehydration.    Hyponatremia Probably secondary to dehydration.    Hyperlipidemia Continue with statin.    Type 2 diabetes mellitus Noninsulin-dependent A1c 7.2 CBGs appear to be optimal at this time.     Hypertension Blood pressure parameters appear to be optimal at this time.         DVT prophylaxis: SCDs Code Status: Full code family Communication: (wife at bedside. ) Disposition:   Status is: Inpatient  Remains inpatient appropriate because:Unsafe d/c plan   Dispo: The patient is from: Home              Anticipated d/c is to: SNF              Patient currently is not medically stable to d/c.   Difficult to place patient No       Level of care: Telemetry Medical Consultants:   None.   Procedures: none.   Antimicrobials: Antibiotics Given (last 72 hours)    Date/Time Action Medication Dose Rate   01/22/21 1928 New Bag/Given   meropenem (MERREM) 1 g in sodium chloride 0.9 % 100 mL IVPB 1 g 200 mL/hr   01/23/21 0144 New Bag/Given   meropenem (MERREM) 1 g in sodium chloride 0.9 % 100 mL IVPB 1 g 200 mL/hr   01/23/21 1047 New Bag/Given   meropenem (MERREM) 1 g in sodium chloride 0.9 % 100 mL IVPB 1 g 200 mL/hr   01/23/21 1824 New Bag/Given   meropenem (MERREM) 1 g in sodium chloride 0.9 % 100 mL IVPB 1 g 200 mL/hr   01/24/21 0228 New Bag/Given   meropenem (MERREM) 1 g in sodium chloride 0.9 %  100 mL IVPB 1 g 200 mL/hr          Subjective: Pt pleasant, in restraints, family at bedside.   Objective: Vitals:   01/25/21 0742 01/25/21 0911 01/25/21 1051 01/25/21 1136  BP: (!) 145/75 (!) 144/73 132/71 115/80  Pulse: 98 (!) 104 92 (!) 104  Resp: 18   18  Temp: 98.3 F (36.8 C)   97.6 F (36.4 C)  TempSrc: Oral   Oral  SpO2: 98%   96%  Weight:      Height:        Intake/Output Summary (Last 24 hours) at  01/25/2021 1242 Last data filed at 01/25/2021 0900 Gross per 24 hour  Intake 284 ml  Output 1300 ml  Net -1016 ml   Filed Weights   01/17/21 1749  Weight: 94.3 kg    Examination:  General exam: Appears calm and comfortable  Respiratory system: Clear to auscultation. Respiratory effort normal. Cardiovascular system: S1 & S2 heard, RRR. Marland Kitchen No pedal edema. Gastrointestinal system: Abdomen is nondistended, soft and nontender.Normal bowel sounds heard. Central nervous system: Alert and oriented. No focal neurological deficits. Extremities: Symmetric 5 x 5 power. Skin: No rashes, lesions or ulcers Psychiatry: Mood & affect appropriate.     Data Reviewed: I have personally reviewed following labs and imaging studies  CBC: Recent Labs  Lab 01/21/21 0218 01/22/21 0115 01/23/21 0506 01/24/21 0353 01/25/21 0514  WBC 11.9* 10.6* 11.5* 11.4* 12.4*  NEUTROABS 8.2* 7.9* 8.2* 8.0* 8.1*  HGB 14.0 13.5 13.9 13.7 12.7*  HCT 40.5 40.5 39.6 40.1 37.6*  MCV 88.0 91.6 87.8 89.9 90.2  PLT 228 216 230 177 940    Basic Metabolic Panel: Recent Labs  Lab 01/21/21 0218 01/22/21 0115 01/23/21 0506 01/24/21 0353 01/25/21 0514  NA 134* 132* 131* 136 132*  K 3.8 4.2 3.9 4.3 4.0  CL 102 101 99 102 99  CO2 22 21* 22 23 25   GLUCOSE 158* 185* 156* 143* 154*  BUN 15 17 11 12 11   CREATININE 0.75 0.67 0.68 0.67 0.74  CALCIUM 9.5 9.2 9.0 9.1 8.9  MG 2.1 2.0 2.1 2.2 2.3    GFR: Estimated Creatinine Clearance: 97.1 mL/min (by C-G formula based on SCr of 0.74 mg/dL).  Liver Function Tests: Recent Labs  Lab 01/21/21 0218 01/22/21 0115 01/23/21 0506 01/24/21 0353 01/25/21 0514  AST 30 39 25 49* 39  ALT 18 22 19 29 29   ALKPHOS 93 89 89 87 93  BILITOT 1.3* 1.3* 1.3* 1.1 0.7  PROT 5.9* 5.7* 5.6* 5.2* 5.0*  ALBUMIN 2.7* 2.5* 2.2* 2.1* 2.1*    CBG: Recent Labs  Lab 01/24/21 1225 01/24/21 1628 01/24/21 2110 01/25/21 0621 01/25/21 1135  GLUCAP 177* 191* 145* 152* 140*     Recent  Results (from the past 240 hour(s))  Culture, Urine     Status: Abnormal   Collection Time: 01/17/21  6:16 PM   Specimen: Urine, Random  Result Value Ref Range Status   Specimen Description URINE, RANDOM  Final   Special Requests   Final    NONE Performed at [facility], [place] [address], [place], [place] [address]    Culture (A)  Final    >=100,000 COLONIES/mL MULTIPLE SPECIES PRESENT, SUGGEST RECOLLECTION   Report Status 01/19/2021 FINAL  Final  Culture, blood (routine x 2)     Status: None   Collection Time: 01/17/21  7:39 PM   Specimen: BLOOD RIGHT HAND  Result Value Ref Range Status  Specimen Description BLOOD RIGHT HAND  Final   Special Requests   Final    BOTTLES DRAWN AEROBIC AND ANAEROBIC Blood Culture adequate volume   Culture   Final    NO GROWTH 5 DAYS Performed at [facility], [address], [place], [place] [address]    Report Status 01/22/2021 FINAL  Final  Culture, blood (routine x 2)     Status: None   Collection Time: 01/17/21  7:42 PM   Specimen: BLOOD  Result Value Ref Range Status   Specimen Description BLOOD RIGHT ANTECUBITAL  Final   Special Requests   Final    BOTTLES DRAWN AEROBIC AND ANAEROBIC Blood Culture adequate volume   Culture   Final    NO GROWTH 5 DAYS Performed at [facility], [place] [address], [place], [place] [address]    Report Status 01/22/2021 FINAL  Final  Culture, Urine     Status: None   Collection Time: 01/21/21  7:44 AM   Specimen: Urine, Catheterized  Result Value Ref Range Status   Specimen Description URINE, CATHETERIZED  Final   Special Requests NONE  Final   Culture   Final    NO GROWTH Performed at [facility], [address] [place],  [address]    Report Status 01/22/2021 FINAL  Final         Radiology Studies: NM Hepato W/EF  Result Date: 01/24/2021 CLINICAL DATA:  Biliary colic, gallbladder sludge and wall thickening on ultrasound EXAM: NUCLEAR MEDICINE HEPATOBILIARY  IMAGING TECHNIQUE: Sequential images of the abdomen were obtained out to 60 minutes following intravenous administration of radiopharmaceutical. RADIOPHARMACEUTICALS:  5.25 mCi Tc-72m  Choletec IV COMPARISON:  Right upper quadrant ultrasound dated 01/22/2021. CT abdomen/pelvis dated 01/22/2021. FINDINGS: Prompt uptake and biliary excretion of activity by the liver is seen. Biliary activity passes into small bowel, consistent with patent common bile duct. Gallbladder not visualized at 60 minutes. Patient refused additional imaging. As such, patency of the cystic duct cannot be confirmed, and cholecystitis is not excluded. When correlating with prior studies, gallbladder was mildly distended with wall thickening on CT. No gallstones were evident on ultrasound. Overall clinical picture favors chronic cholecystitis over acute cholecystitis. IMPRESSION: Nonvisualization of the gallbladder, raising concern for cholecystitis, possibly chronic. These results will be called to the ordering clinician or representative by the Radiologist Assistant, and communication documented in the PACS or [employer]. Electronically Signed   By: Julian Hy M.D.   On: 01/24/2021 12:33        Scheduled Meds: .  stroke: mapping our early stages of recovery book   Does not apply Once  . amLODipine  10 mg Oral Daily  . aspirin EC  81 mg Oral Daily  . bisacodyl  10 mg Rectal Daily  . Chlorhexidine Gluconate Cloth  6 each Topical Daily  . cyanocobalamin  1,000 mcg Subcutaneous Q30 days  . docusate sodium  200 mg Oral BID  . enoxaparin (LOVENOX) injection  40 mg Subcutaneous Q24H  . insulin aspart  0-15 Units Subcutaneous TID WC  . isosorbide mononitrate  30 mg Oral Daily  . metoprolol tartrate  100 mg Oral BID  . multivitamin with minerals  1 tablet Oral Daily  . polyethylene glycol  17 g Oral BID  . QUEtiapine  25 mg Oral BID  . simvastatin  40 mg Oral QHS  . tamsulosin  0.4 mg Oral Daily  . thiamine  100 mg  Oral Daily  Continuous Infusions:   LOS: 13 days        Hosie Poisson, MD [facility]   To contact the attending provider between 7A-7P or the covering provider during after hours 7P-7A, please log into the web site [email or web address] and access using universal [place] password for that web site. If you do not have the password, please call the hospital operator.  01/25/2021, 12:42 PM

## 2021-01-25 NOTE — Progress Notes (Signed)
Subjective: CC: Upset that I woke him up. He reports he is tolerating dysphagia diet without any abdominal pain, n/v yesterday or today.   Objective: Vital signs in last 24 hours: Temp:  [97.5 F (36.4 C)-98.6 F (37 C)] 98.3 F (36.8 C) (04/20 0742) Pulse Rate:  [94-110] 98 (04/20 0742) Resp:  [16-20] 18 (04/20 0742) BP: (112-145)/(70-83) 145/75 (04/20 0742) SpO2:  [93 %-99 %] 98 % (04/20 0742) Last BM Date: 01/23/21  Intake/Output from previous day: 04/19 0701 - 04/20 0700 In: 244 [P.O.:234] Out: 1960 [Urine:1960] Intake/Output this shift: No intake/output data recorded.  PE: Gen: Awake and alert, nad Pulm: normal rate and effort Abd: soft, NT/ND, +BS, no masses or organomegaly. Soft ventral hernia without overlying skin changes Msk: no LE edema  Lab Results:  Recent Labs    01/24/21 0353 01/25/21 0514  WBC 11.4* 12.4*  HGB 13.7 12.7*  HCT 40.1 37.6*  PLT 177 240   BMET Recent Labs    01/24/21 0353 01/25/21 0514  NA 136 132*  K 4.3 4.0  CL 102 99  CO2 23 25  GLUCOSE 143* 154*  BUN 12 11  CREATININE 0.67 0.74  CALCIUM 9.1 8.9   PT/INR No results for input(s): LABPROT, INR in the last 72 hours. CMP     Component Value Date/Time   NA 132 (L) 01/25/2021 0514   K 4.0 01/25/2021 0514   CL 99 01/25/2021 0514   CO2 25 01/25/2021 0514   GLUCOSE 154 (H) 01/25/2021 0514   BUN 11 01/25/2021 0514   CREATININE 0.74 01/25/2021 0514   CALCIUM 8.9 01/25/2021 0514   PROT 5.0 (L) 01/25/2021 0514   ALBUMIN 2.1 (L) 01/25/2021 0514   AST 39 01/25/2021 0514   ALT 29 01/25/2021 0514   ALKPHOS 93 01/25/2021 0514   BILITOT 0.7 01/25/2021 0514   GFRNONAA >60 01/25/2021 0514   GFRAA >60 08/10/2016 0417   Lipase     Component Value Date/Time   LIPASE 65 (H) 01/06/2014 1200       Studies/Results: NM Hepato W/EF  Result Date: 01/24/2021 CLINICAL DATA:  Biliary colic, gallbladder sludge and wall thickening on ultrasound EXAM: NUCLEAR MEDICINE  HEPATOBILIARY IMAGING TECHNIQUE: Sequential images of the abdomen were obtained out to 60 minutes following intravenous administration of radiopharmaceutical. RADIOPHARMACEUTICALS:  5.25 mCi Tc-81m  Choletec IV COMPARISON:  Right upper quadrant ultrasound dated 01/22/2021. CT abdomen/pelvis dated 01/22/2021. FINDINGS: Prompt uptake and biliary excretion of activity by the liver is seen. Biliary activity passes into small bowel, consistent with patent common bile duct. Gallbladder not visualized at 60 minutes. Patient refused additional imaging. As such, patency of the cystic duct cannot be confirmed, and cholecystitis is not excluded. When correlating with prior studies, gallbladder was mildly distended with wall thickening on CT. No gallstones were evident on ultrasound. Overall clinical picture favors chronic cholecystitis over acute cholecystitis. IMPRESSION: Nonvisualization of the gallbladder, raising concern for cholecystitis, possibly chronic. These results will be called to the ordering clinician or representative by the Radiologist Assistant, and communication documented in the PACS or [employer]. Electronically Signed   By: Julian Hy M.D.   On: 01/24/2021 12:33   EEG adult  Result Date: 01/23/2021 Lora Havens, MD     01/23/2021 10:18 AM Patient Name: Vincent [name] MRN: [id number] Epilepsy Attending: Lora Havens Referring Physician/Provider: Dr Lala Lund Date: 01/23/2021 Duration: 22.30 mins Patient history: 70 year old male with altered mental status.  EEG to evaluate for  seizures. Level of alertness: lethargic, asleep AEDs during EEG study: None Technical aspects: This EEG study was done with scalp electrodes positioned according to the 10-20 International system of electrode placement. Electrical activity was acquired at a sampling rate of 500Hz  and reviewed with a high frequency filter of 70Hz  and a low frequency filter of 1Hz . EEG data were recorded continuously and  digitally stored. Description: No clear posterior dominant rhythm was seen. Sleep was characterized by vertex waves, sleep spindles (12 to 14 Hz), maximal frontocentral region. EEG showed continuous generalized 5 to 7 Hz theta as well as intermittent 2-3Hz  delta slowing. Physiologic photic driving was not seen during photic stimulation. Hyperventilation was not performed.   ABNORMALITY - Continuous slow, generalized IMPRESSION: This study is suggestive of moderate diffuse encephalopathy, nonspecific etiology. No seizures or epileptiform discharges were seen throughout the recording. Priyanka Barbra Sarks    Anti-infectives: Anti-infectives (From admission, onward)   Start     Dose/Rate Route Frequency Ordered Stop   01/22/21 1030  meropenem (MERREM) 1 g in sodium chloride 0.9 % 100 mL IVPB        1 g 200 mL/hr over 30 Minutes Intravenous Every 8 hours 01/22/21 0932 01/24/21 0258   01/19/21 2200  doxycycline (VIBRA-TABS) tablet 100 mg  Status:  Discontinued        100 mg Oral Every 12 hours 01/19/21 0903 01/22/21 0759   01/17/21 1830  doxycycline (VIBRAMYCIN) 100 mg in sodium chloride 0.9 % 250 mL IVPB  Status:  Discontinued        100 mg 125 mL/hr over 120 Minutes Intravenous Every 12 hours 01/17/21 1730 01/19/21 0903   01/17/21 1830  meropenem (MERREM) 1 g in sodium chloride 0.9 % 100 mL IVPB        1 g 200 mL/hr over 30 Minutes Intravenous Every 8 hours 01/17/21 1757 01/22/21 1222       Assessment/Plan Encephalopathy/slurred speech/visual hallucinations Incidental small right parietal stroke  Vitamin B12 deficiency Urinary retention Chronic systolic and diastolic heart failure CAD-s/p PCI to LAD in 2007 HTN HLD DM Obesity Chronic pain  Mild leukocytosis, tachycardia, low grade fevers ?Cholecystitis on imaging  - Patient has no abdominal complaints including pain, nausea, or vomiting. He is completely nontender on abdominal exam and has been tolerating a dysphagia diet. CT scan with  "mildly distended gallbladder with suggestion of mild pericholecystic edema". Ultrasound is negative for cholelithiasis but does note sludge and wall thickening to 5 mm.  HIDA inconclusive as the test was not completed, though the gallbladder was not visualized at 60 minutes. HIDA scan reports possible chronic cholecystitis; gallbladder not visualized at 60 minutes but patient refused additional imaging therefore patency of the cystic duct not confirmed. LFTs wnl. - Low suspicion for acute cholecystitis at this time. Given overall clinical picture, I do not think any intervention for his gallbladder is indicated at this time.  He may have chronic cholecystitis which appears to be asymptomatic, versus secondary distention and thickening of the gallbladder due to chronic heart failure.  If he were to develop abdominal pain or issues with PO intake please let us know.   ID - none currently VTE - lovenox FEN - D3 diet Foley - in place   LOS: 13 days    Jillyn Ledger , [facility] 01/25/2021, 8:48 AM Please see Amion for pager number during day hours 7:00am-4:30pm

## 2021-01-25 NOTE — Plan of Care (Signed)

## 2021-01-26 LAB — PROCALCITONIN: Procalcitonin: <0.1 ng/mL

## 2021-01-26 LAB — COMPREHENSIVE METABOLIC PANEL
ALT: 26 U/L (ref 0–44)
AST: 28 U/L (ref 15–41)
Albumin: 2.3 g/dL — ABNORMAL LOW (ref 3.5–5.0)
Alkaline Phosphatase: 93 U/L (ref 38–126)
Anion gap: 9 (ref 5–15)
BUN: 8 mg/dL (ref 8–23)
CO2: 26 mmol/L (ref 22–32)
Calcium: 9.2 mg/dL (ref 8.9–10.3)
Chloride: 102 mmol/L (ref 98–111)
Creatinine, Ser: 0.74 mg/dL (ref 0.61–1.24)
GFR, Estimated: >60 mL/min (ref >60)
Glucose, Bld: 142 mg/dL — ABNORMAL HIGH (ref 70–99)
Potassium: 3.9 mmol/L (ref 3.5–5.1)
Sodium: 137 mmol/L (ref 135–145)
Total Bilirubin: 0.9 mg/dL (ref 0.3–1.2)
Total Protein: 5.4 g/dL — ABNORMAL LOW (ref 6.5–8.1)

## 2021-01-26 LAB — GLUCOSE, CAPILLARY
Glucose-Capillary: 181 mg/dL — ABNORMAL HIGH (ref 70–99)
Glucose-Capillary: 201 mg/dL — ABNORMAL HIGH (ref 70–99)
Glucose-Capillary: 145 mg/dL — ABNORMAL HIGH (ref 70–99)
Glucose-Capillary: 221 mg/dL — ABNORMAL HIGH (ref 70–99)

## 2021-01-26 LAB — CBC WITH DIFFERENTIAL/PLATELET
Abs Immature Granulocytes: 0.06 10*3/uL (ref 0.00–0.07)
Basophils Absolute: 0 10*3/uL (ref 0.0–0.1)
Basophils Relative: 0 %
Eosinophils Absolute: 0.2 10*3/uL (ref 0.0–0.5)
Eosinophils Relative: 3 %
HCT: 40.7 % (ref 39.0–52.0)
Hemoglobin: 13.7 g/dL (ref 13.0–17.0)
Immature Granulocytes: 1 %
Lymphocytes Relative: 18 %
Lymphs Abs: 1.6 10*3/uL (ref 0.7–4.0)
MCH: 30.5 pg (ref 26.0–34.0)
MCHC: 33.7 g/dL (ref 30.0–36.0)
MCV: 90.6 fL (ref 80.0–100.0)
Monocytes Absolute: 1.1 10*3/uL — ABNORMAL HIGH (ref 0.1–1.0)
Monocytes Relative: 12 %
Neutro Abs: 6 10*3/uL (ref 1.7–7.7)
Neutrophils Relative %: 66 %
Platelets: 287 10*3/uL (ref 150–400)
RBC: 4.49 MIL/uL (ref 4.22–5.81)
RDW: 12.9 % (ref 11.5–15.5)
WBC: 9 10*3/uL (ref 4.0–10.5)
nRBC: 0 % (ref 0.0–0.2)

## 2021-01-26 LAB — MAGNESIUM: Magnesium: 2.4 mg/dL (ref 1.7–2.4)

## 2021-01-26 LAB — BRAIN NATRIURETIC PEPTIDE: B Natriuretic Peptide: 38.3 pg/mL (ref 0.0–100.0)

## 2021-01-26 MED ORDER — METOPROLOL TARTRATE 50 MG PO TABS
125.0000 mg | ORAL_TABLET | Freq: Two times a day (BID) | ORAL | Status: DC
  Administered 2021-01-26 – 2021-01-27 (×2): 125 mg via ORAL
  Filled 2021-01-26 (×2): qty 2

## 2021-01-26 MED ORDER — RESOURCE THICKENUP CLEAR PO POWD
ORAL | Status: DC | PRN
  Filled 2021-01-26: qty 125

## 2021-01-26 NOTE — Progress Notes (Signed)
PROGRESS NOTE    Vincent [name]  MVH:[id number] DOB: 11-11-1950 DOA: 01/12/2021 PCP: Ginger Organ., MD    Chief Complaint  Patient presents with  . Weakness    Brief Narrative: Patient is a 70 y.o. male with history of insulin-dependent DM-2, HTN, CAD, chronic systolic heart failure, chronic pain syndrome on narcotics/benzos-presented with a 2-week history of worsening confusion, visual hallucinations and frequent falls.  Spouse reports slurred speech for a few days as well.  Patient was subsequently admitted to the hospitalist service for further evaluation and treatment.   Assessment & Plan:   Active Problems:   CVA (cerebral vascular accident) ([place])   Acute encephalopathy Probably secondary to a combination of vitamin B12 deficiency, incidental small right parietal stroke and polypharmacy Patient's mental status is improving but not back to baseline yet as per the family at bedside patient has been progressive over several months. EEG does not show any active epileptiform activity. Neurology consulted and recommendations given. Therapy evaluations recommending SNF, TOC on board to assist. Vitamin B12 supplementations have been added. Patient currently does not have any focal deficits at this time. No new complaints. Pt is alert and oriented to place and person.    Persistent tachycardia with mild leukocytosis and low-grade fevers Possibly secondary to aspiration pneumonia which has been completely treated with IV antibiotics. CT of the abdomen and pelvis shows some inconclusive gallbladder pathology. HIDA scan shows possibly chronic cholecystitis. General surgery consulted recommended no further intervention at this time as patient has been pain-free. He remains tachycardic, increased metoprolol to 125 mg BID.  No fever tday.     Combined chronic systolic and diastolic heart failure with left ventricular ejection fraction around 50% Patient is euvolemic and on  beta-blocker.    Coronary artery disease s/p PCI to LAD, Patient currently denies any chest pain or shortness of breath Continue with aspirin and recommend outpatient follow-up with cardiology on discharge.    Mild AKI Resolved.  Creatinine back to baseline.     Hyponatremia Probably secondary to dehydration. Resolved.     Hyperlipidemia Continue with statin.    Type 2 diabetes mellitus Noninsulin-dependent A1c 7.2 CBGs appear to be optimal at this time. CBG (last 3)  Recent Labs    01/25/21 2110 01/26/21 0609 01/26/21 1145  GLUCAP 155* 181* 201*        Hypertension Blood pressure parameters appear to be optimal at this time.         DVT prophylaxis: SCDs Code Status: Full code  family Communication: none at bedside.  Disposition:   Status is: Inpatient  Remains inpatient appropriate because:Unsafe d/c plan   Dispo: The patient is from: Home              Anticipated d/c is to: SNF              Patient currently is medically stable to d/c.   Difficult to place patient No       Level of care: Telemetry Medical Consultants:   None.   Procedures: none.   Antimicrobials: Antibiotics Given (last 72 hours)    Date/Time Action Medication Dose Rate   01/23/21 1824 New Bag/Given   meropenem (MERREM) 1 g in sodium chloride 0.9 % 100 mL IVPB 1 g 200 mL/hr   01/24/21 0228 New Bag/Given   meropenem (MERREM) 1 g in sodium chloride 0.9 % 100 mL IVPB 1 g 200 mL/hr         Subjective: Wants to know  when he can be discharged.  Objective: Vitals:   01/26/21 0346 01/26/21 0815 01/26/21 1132 01/26/21 1200  BP: (!) 150/84 (!) 150/85 (!) 139/95 112/71  Pulse: (!) 108 (!) 110 (!) 115 99  Resp: 20 20 20    Temp: 98.3 F (36.8 C) 99.9 F (37.7 C) 98.3 F (36.8 C)   TempSrc: Oral Oral Axillary   SpO2: 96% 98% 98% 98%  Weight:      Height:        Intake/Output Summary (Last 24 hours) at 01/26/2021 1416 Last data filed at 01/26/2021  1100 Gross per 24 hour  Intake 360 ml  Output 2800 ml  Net -2440 ml   Filed Weights   01/17/21 1749  Weight: 94.3 kg    Examination:  General exam: Alert and comfortable, not in any kind of distress Respiratory system: Clear to auscultation bilaterally, no wheezing or rhonchi Cardiovascular system: S1-S2 heard, tachycardic, regular rhythm no pedal edema Gastrointestinal system: Abdomen is soft, nontender, nondistended, bowel sounds heard Central nervous system: Alert and oriented to person and place, Extremities: No pedal edema Skin: No rashes seen  psychiatry: Mood is appropriate    Data Reviewed: I have personally reviewed following labs and imaging studies  CBC: Recent Labs  Lab 01/22/21 0115 01/23/21 0506 01/24/21 0353 01/25/21 0514 01/26/21 0238  WBC 10.6* 11.5* 11.4* 12.4* 9.0  NEUTROABS 7.9* 8.2* 8.0* 8.1* 6.0  HGB 13.5 13.9 13.7 12.7* 13.7  HCT 40.5 39.6 40.1 37.6* 40.7  MCV 91.6 87.8 89.9 90.2 90.6  PLT 216 230 177 240 496    Basic Metabolic Panel: Recent Labs  Lab 01/22/21 0115 01/23/21 0506 01/24/21 0353 01/25/21 0514 01/26/21 0238  NA 132* 131* 136 132* 137  K 4.2 3.9 4.3 4.0 3.9  CL 101 99 102 99 102  CO2 21* 22 23 25 26   GLUCOSE 185* 156* 143* 154* 142*  BUN 17 11 12 11 8   CREATININE 0.67 0.68 0.67 0.74 0.74  CALCIUM 9.2 9.0 9.1 8.9 9.2  MG 2.0 2.1 2.2 2.3 2.4    GFR: Estimated Creatinine Clearance: 97.1 mL/min (by C-G formula based on SCr of 0.74 mg/dL).  Liver Function Tests: Recent Labs  Lab 01/22/21 0115 01/23/21 0506 01/24/21 0353 01/25/21 0514 01/26/21 0238  AST 39 25 49* 39 28  ALT 22 19 29 29 26   ALKPHOS 89 89 87 93 93  BILITOT 1.3* 1.3* 1.1 0.7 0.9  PROT 5.7* 5.6* 5.2* 5.0* 5.4*  ALBUMIN 2.5* 2.2* 2.1* 2.1* 2.3*    CBG: Recent Labs  Lab 01/25/21 1316 01/25/21 1648 01/25/21 2110 01/26/21 0609 01/26/21 1145  GLUCAP 174* 173* 155* 181* 201*     Recent Results (from the past 240 hour(s))  Culture, Urine      Status: Abnormal   Collection Time: 01/17/21  6:16 PM   Specimen: Urine, Random  Result Value Ref Range Status   Specimen Description URINE, RANDOM  Final   Special Requests   Final    NONE Performed at [facility], [place] [address], [place], [place] [address]    Culture (A)  Final    >=100,000 COLONIES/mL MULTIPLE SPECIES PRESENT, SUGGEST RECOLLECTION   Report Status 01/19/2021 FINAL  Final  Culture, blood (routine x 2)     Status: None   Collection Time: 01/17/21  7:39 PM   Specimen: BLOOD RIGHT HAND  Result Value Ref Range Status   Specimen Description BLOOD RIGHT HAND  Final   Special Requests   Final  BOTTLES DRAWN AEROBIC AND ANAEROBIC Blood Culture adequate volume   Culture   Final    NO GROWTH 5 DAYS Performed at [facility], [place] [address], [place], [place] [address]    Report Status 01/22/2021 FINAL  Final  Culture, blood (routine x 2)     Status: None   Collection Time: 01/17/21  7:42 PM   Specimen: BLOOD  Result Value Ref Range Status   Specimen Description BLOOD RIGHT ANTECUBITAL  Final   Special Requests   Final    BOTTLES DRAWN AEROBIC AND ANAEROBIC Blood Culture adequate volume   Culture   Final    NO GROWTH 5 DAYS Performed at [facility], [place] [address] [place], [place] [address]    Report Status 01/22/2021 FINAL  Final  Culture, Urine     Status: None   Collection Time: 01/21/21  7:44 AM   Specimen: Urine, Catheterized  Result Value Ref Range Status   Specimen Description URINE, CATHETERIZED  Final   Special Requests NONE  Final   Culture   Final    NO GROWTH Performed at [facility], [address], [place], [place] [address]    Report Status 01/22/2021 FINAL  Final         Radiology Studies: No results found.      Scheduled Meds: .  stroke: mapping our early stages of recovery book   Does not apply Once  . amLODipine  10 mg Oral Daily  . aspirin EC  81 mg Oral Daily  . bisacodyl  10 mg Rectal Daily  .  Chlorhexidine Gluconate Cloth  6 each Topical Daily  . cyanocobalamin  1,000 mcg Subcutaneous Q30 days  . docusate sodium  200 mg Oral BID  . enoxaparin (LOVENOX) injection  40 mg Subcutaneous Q24H  . insulin aspart  0-15 Units Subcutaneous TID WC  . isosorbide mononitrate  30 mg Oral Daily  . metoprolol tartrate  125 mg Oral BID  . multivitamin with minerals  1 tablet Oral Daily  . polyethylene glycol  17 g Oral BID  . QUEtiapine  25 mg Oral BID  . simvastatin  40 mg Oral QHS  . tamsulosin  0.4 mg Oral Daily  . thiamine  100 mg Oral Daily   Continuous Infusions:   LOS: 14 days        Hosie Poisson, MD [facility]   To contact the attending provider between 7A-7P or the covering provider during after hours 7P-7A, please log into the web site [email or web address] and access using universal [place] password for that web site. If you do not have the password, please call the hospital operator.  01/26/2021, 2:16 PM

## 2021-01-26 NOTE — Progress Notes (Signed)
Occupational Therapy Treatment Patient Details Name: Vincent [name] MRN: [id number] DOB: [birth year] Today's Date: 01/26/2021    History of present illness 70 y/o male presented to ED at [facility] on 4/7 after being sent to [facility] for emergent MRI but unable to get to car. 2 weeks prior, patient had syncopal episode at PCP and has had 16 falls since. Patient presents with slurred speech, weakness, and confusion for one month. Unable to obtain MRI secondary to agitation. CT head and c-spine show chronic microvascular disease and severe C6 degenerative changes without acute process. PMH: anxiety, CAD, depression, DM type 2, HLD, HTN   OT comments  Patient supine in bed and agreeable to OT session. Patient following simple commands and engaging well, but becomes easily agitated when attempting to move R LE during transition to EOB due to pain and declines further mobility.  He requires max assist for rolling, initiate movement of L LE off EOB but not R LE (refused assist). He continues to have decreased awareness reporting "give me that walker and I can walk", but pt unable to sit EOB today.  Grooming from bed level with setup assist.  Downgraded goals today.  Will follow acutely.    Follow Up Recommendations  SNF;Supervision/Assistance - 24 hour    Equipment Recommendations  3 in 1 bedside commode    Recommendations for Other Services      Precautions / Restrictions Precautions Precautions: Fall Restrictions Weight Bearing Restrictions: No       Mobility Bed Mobility Overal bed mobility: Needs Assistance Bed Mobility: Rolling Rolling: Max assist         General bed mobility comments: rolling towards L side of bed with max assist, initated L LE off EOB but unable to progress R LE without pain and declined further mobility; pulled self up towards [facility] with min assist and increased time    Transfers                 General transfer comment: deferred, pt declined due to R knee  pain with attempted EOB    Balance                                           ADL either performed or assessed with clinical judgement   ADL Overall ADL's : Needs assistance/impaired     Grooming: Wash/dry hands;Wash/dry face;Set up;Bed level Grooming Details (indicate cue type and reason): setup, cueing for thoroughness                   Toilet Transfer Details (indicate cue type and reason): deferred         Functional mobility during ADLs: Total assistance General ADL Comments: limited session due to patient participation     Vision       Perception     Praxis      Cognition Arousal/Alertness: Awake/alert Behavior During Therapy: Agitated Overall Cognitive Status: Impaired/Different from baseline Area of Impairment: Attention;Memory;Following commands;Safety/judgement;Awareness;Problem solving                   Current Attention Level: Sustained Memory: Decreased recall of precautions;Decreased short-term memory Following Commands: Follows one step commands with increased time;Follows one step commands inconsistently Safety/Judgement: Decreased awareness of safety;Decreased awareness of deficits Awareness: Intellectual Problem Solving: Slow processing;Decreased initiation;Difficulty sequencing;Requires verbal cues;Requires tactile cues General Comments: patient oriented, following simple commands with increased  time but inconsistently.  Poor awareness to situation and deficits, reporting "Just give me that walker and I"ll get up", although unable to get to EOB        Exercises Exercises: General Upper Extremity General Exercises - Upper Extremity Shoulder Flexion: AROM;10 reps;Both;Supine Digit Composite Flexion: AROM;Both;10 reps;Supine Composite Extension: AROM;Both;10 reps;Supine   Shoulder Instructions       General Comments      Pertinent Vitals/ Pain       Pain Assessment: Faces Faces Pain Scale: Hurts even  more Pain Location: R knee with attempted movement Pain Descriptors / Indicators: Discomfort;Grimacing;Guarding Pain Intervention(s): Monitored during session;Repositioned  Home Living                                          Prior Functioning/Environment              Frequency  Min 2X/week        Progress Toward Goals  OT Goals(current goals can now be found in the care plan section)  Progress towards OT goals: Not progressing toward goals - comment (remains bed level)  Acute Rehab OT Goals Patient Stated Goal: to walk OT Goal Formulation: With patient  Plan Discharge plan remains appropriate;Frequency remains appropriate    Co-evaluation                 AM-PAC OT "6 Clicks" Daily Activity     Outcome Measure   Help from another person eating meals?: A Little Help from another person taking care of personal grooming?: A Little Help from another person toileting, which includes using toliet, bedpan, or urinal?: Total Help from another person bathing (including washing, rinsing, drying)?: A Lot Help from another person to put on and taking off regular upper body clothing?: A Lot Help from another person to put on and taking off regular lower body clothing?: Total 6 Click Score: 12    End of Session    OT Visit Diagnosis: Unsteadiness on feet (R26.81);Muscle weakness (generalized) (M62.81);Other symptoms and signs involving cognitive function Pain - Right/Left: Right Pain - part of body: Knee   Activity Tolerance Patient limited by pain   Patient Left in bed;with call bell/phone within reach;with bed alarm set   Nurse Communication Mobility status        Time: 8453-6468 OT Time Calculation (min): 21 min  Charges: OT General Charges $OT Visit: 1 Visit OT Treatments $Self Care/Home Management : 8-22 mins  Jolaine Artist, [place] Pager [phone] Office [phone]    Delight Stare 01/26/2021,  3:12 PM

## 2021-01-27 DIAGNOSIS — G459 Transient cerebral ischemic attack, unspecified: Secondary | ICD-10-CM | POA: Diagnosis not present

## 2021-01-27 DIAGNOSIS — G8929 Other chronic pain: Secondary | ICD-10-CM | POA: Diagnosis not present

## 2021-01-27 DIAGNOSIS — I2510 Atherosclerotic heart disease of native coronary artery without angina pectoris: Secondary | ICD-10-CM | POA: Diagnosis not present

## 2021-01-27 DIAGNOSIS — I5022 Chronic systolic (congestive) heart failure: Secondary | ICD-10-CM

## 2021-01-27 DIAGNOSIS — G4733 Obstructive sleep apnea (adult) (pediatric): Secondary | ICD-10-CM | POA: Diagnosis not present

## 2021-01-27 DIAGNOSIS — R278 Other lack of coordination: Secondary | ICD-10-CM | POA: Diagnosis not present

## 2021-01-27 DIAGNOSIS — G9340 Encephalopathy, unspecified: Secondary | ICD-10-CM | POA: Diagnosis not present

## 2021-01-27 DIAGNOSIS — I69320 Aphasia following cerebral infarction: Secondary | ICD-10-CM | POA: Diagnosis not present

## 2021-01-27 DIAGNOSIS — E1122 Type 2 diabetes mellitus with diabetic chronic kidney disease: Secondary | ICD-10-CM | POA: Diagnosis not present

## 2021-01-27 DIAGNOSIS — R1310 Dysphagia, unspecified: Secondary | ICD-10-CM | POA: Diagnosis not present

## 2021-01-27 DIAGNOSIS — M6281 Muscle weakness (generalized): Secondary | ICD-10-CM | POA: Diagnosis not present

## 2021-01-27 DIAGNOSIS — R2689 Other abnormalities of gait and mobility: Secondary | ICD-10-CM | POA: Diagnosis not present

## 2021-01-27 DIAGNOSIS — R110 Nausea: Secondary | ICD-10-CM | POA: Diagnosis not present

## 2021-01-27 DIAGNOSIS — E139 Other specified diabetes mellitus without complications: Secondary | ICD-10-CM | POA: Diagnosis present

## 2021-01-27 DIAGNOSIS — Z7401 Bed confinement status: Secondary | ICD-10-CM | POA: Diagnosis not present

## 2021-01-27 DIAGNOSIS — M2550 Pain in unspecified joint: Secondary | ICD-10-CM | POA: Diagnosis not present

## 2021-01-27 DIAGNOSIS — I5042 Chronic combined systolic (congestive) and diastolic (congestive) heart failure: Secondary | ICD-10-CM | POA: Diagnosis not present

## 2021-01-27 DIAGNOSIS — I6300 Cerebral infarction due to thrombosis of unspecified precerebral artery: Secondary | ICD-10-CM | POA: Diagnosis not present

## 2021-01-27 DIAGNOSIS — I10 Essential (primary) hypertension: Secondary | ICD-10-CM | POA: Diagnosis not present

## 2021-01-27 DIAGNOSIS — E538 Deficiency of other specified B group vitamins: Secondary | ICD-10-CM

## 2021-01-27 DIAGNOSIS — R410 Disorientation, unspecified: Secondary | ICD-10-CM | POA: Diagnosis not present

## 2021-01-27 DIAGNOSIS — I69322 Dysarthria following cerebral infarction: Secondary | ICD-10-CM | POA: Diagnosis not present

## 2021-01-27 DIAGNOSIS — R2681 Unsteadiness on feet: Secondary | ICD-10-CM | POA: Diagnosis not present

## 2021-01-27 DIAGNOSIS — R1312 Dysphagia, oropharyngeal phase: Secondary | ICD-10-CM | POA: Diagnosis not present

## 2021-01-27 DIAGNOSIS — E785 Hyperlipidemia, unspecified: Secondary | ICD-10-CM | POA: Diagnosis not present

## 2021-01-27 DIAGNOSIS — G894 Chronic pain syndrome: Secondary | ICD-10-CM | POA: Diagnosis not present

## 2021-01-27 DIAGNOSIS — R262 Difficulty in walking, not elsewhere classified: Secondary | ICD-10-CM | POA: Diagnosis not present

## 2021-01-27 DIAGNOSIS — I639 Cerebral infarction, unspecified: Secondary | ICD-10-CM | POA: Diagnosis not present

## 2021-01-27 LAB — SARS CORONAVIRUS 2 (TAT 6-24 HRS): SARS Coronavirus 2: NEGATIVE

## 2021-01-27 LAB — GLUCOSE, CAPILLARY
Glucose-Capillary: 167 mg/dL — ABNORMAL HIGH (ref 70–99)
Glucose-Capillary: 247 mg/dL — ABNORMAL HIGH (ref 70–99)

## 2021-01-27 MED ORDER — METOPROLOL TARTRATE 25 MG PO TABS: 125.0000 mg | ORAL_TABLET | Freq: Two times a day (BID) | ORAL | 1 refills | Status: DC

## 2021-01-27 MED ORDER — SENNOSIDES-DOCUSATE SODIUM 8.6-50 MG PO TABS: 1.0000 | ORAL_TABLET | Freq: Every evening | ORAL | Status: DC | PRN

## 2021-01-27 MED ORDER — VITAMIN B-12 1000 MCG PO TABS: 1000.0000 ug | ORAL_TABLET | Freq: Every day | ORAL | 3 refills | Status: AC

## 2021-01-27 MED ORDER — TAMSULOSIN HCL 0.4 MG PO CAPS: 0.4000 mg | ORAL_CAPSULE | Freq: Every day | ORAL | 1 refills | Status: DC

## 2021-01-27 MED ORDER — AMLODIPINE BESYLATE 10 MG PO TABS: 10.0000 mg | ORAL_TABLET | Freq: Every day | ORAL | 1 refills | Status: DC

## 2021-01-27 MED ORDER — FUROSEMIDE 40 MG PO TABS: 40.0000 mg | ORAL_TABLET | Freq: Every day | ORAL | 3 refills | Status: DC

## 2021-01-27 MED ORDER — ADULT MULTIVITAMIN W/MINERALS CH: 1.0000 | ORAL_TABLET | Freq: Every day | ORAL | Status: DC

## 2021-01-27 MED ORDER — DOCUSATE SODIUM 100 MG PO CAPS
200.0000 mg | ORAL_CAPSULE | Freq: Two times a day (BID) | ORAL | 0 refills | Status: DC
Start: 2021-01-27 — End: 2021-03-18

## 2021-01-27 MED ORDER — POLYETHYLENE GLYCOL 3350 17 G PO PACK: 17.0000 g | PACK | Freq: Every day | ORAL | 0 refills | Status: DC | PRN

## 2021-01-27 MED ORDER — THIAMINE HCL 100 MG PO TABS: 100.0000 mg | ORAL_TABLET | Freq: Every day | ORAL | 1 refills | Status: DC

## 2021-01-27 MED ORDER — QUETIAPINE FUMARATE 25 MG PO TABS: 25.0000 mg | ORAL_TABLET | Freq: Two times a day (BID) | ORAL | 1 refills | Status: DC

## 2021-01-27 MED ORDER — RESOURCE THICKENUP CLEAR PO POWD: ORAL | Status: DC

## 2021-01-27 NOTE — Discharge Summary (Signed)
Physician Discharge Summary  Vincent [name] [id number] DOB: Nov 13, 1950 DOA: 01/12/2021  PCP: Ginger Organ., MD  Admit date: 01/12/2021 Discharge date: 01/27/2021  Admitted From: HOME Disposition:  SNF  Recommendations for Outpatient Follow-up:  1. Follow up with PCP in 1-2 weeks 2. Please obtain BMP/CBC in one week Please follow up with palliative care services on discharge at SNF.  Please follow up with general surgery if you have abdominal pain.  Please follow up with neurology in 2 to 4 weeks.  Please follow up with cardiology for chronic systolic heart failure.   Discharge Condition:guarded.  CODE STATUS Full code.  Diet recommendation:  Dysphagia 3 diet with nectar thick liquids.   Brief/Interim Summary: Patient is a 70 y.o. male with history of insulin-dependent DM-2, HTN, CAD, chronic systolic heart failure, chronic pain syndrome on narcotics/benzos-presented with a 2-week history of worsening confusion, visual hallucinations and frequent falls.  Spouse reports slurred speech for a few days as well.  Patient was subsequently admitted to the hospitalist service for further evaluation and treatment.  Discharge Diagnoses:  Active Problems:   HLD (hyperlipidemia)   Hypertension   OSA (obstructive sleep apnea)   CVA (cerebral vascular accident) ([place])   Vitamin B12 deficiency   Diabetes 1.5, managed as type 2 ([place])   Chronic systolic CHF (congestive heart failure) (H[facility])   Acute encephalopathy Probably secondary to a combination of vitamin B12 deficiency, incidental small right parietal stroke and polypharmacy. Patient's mental status is improving.  EEG does not show any active epileptiform activity. Neurology consulted and recommendations given. Therapy evaluations recommending SNF, TOC on board to assist. Vitamin B12 supplementations have been added. Patient currently does not have any focal deficits at this time. No new complaints. Pt is alert and oriented to place  and person.    Persistent tachycardia with mild leukocytosis and low-grade fevers Possibly secondary to aspiration pneumonia which has been completely treated with IV antibiotics. CT of the abdomen and pelvis shows some inconclusive gallbladder pathology. HIDA scan shows possibly chronic cholecystitis. General surgery consulted recommended no further intervention at this time as patient has been pain-free. Increased metoprolol to 125 mg BID.     Combined chronic systolic and diastolic heart failure with left ventricular ejection fraction around 50% Patient is euvolemic and on beta-blocker. Restarted the lasix on discharge.  ACE in hibitor on hold for AKI and borderline BP , please restart it as outpatient as per cardiology.     Coronary artery disease s/p PCI to LAD, Patient currently denies any chest pain or shortness of breath Continue with aspirin and recommend outpatient follow-up with cardiology on discharge.     Mild AKI Resolved.  Creatinine back to baseline.     Hyponatremia Probably secondary to dehydration. Resolved.     Hyperlipidemia Continue with statin.    Type 2 diabetes mellitus Noninsulin-dependent A1c 7.2 CBG (last 3)  Recent Labs    01/26/21 2116 01/27/21 0632 01/27/21 1115  GLUCAP 221* 167* 247*   Resume metformin on discharge.      Hypertension Blood pressure parameters appear to be optimal at this time.        Discharge Instructions  Discharge Instructions    Diet - low sodium heart healthy   Complete by: As directed    Discharge instructions   Complete by: As directed    Please follow up with PCP in one week.   Increase activity slowly   Complete by: As directed      Allergies  as of 01/27/2021      Reactions   Penicillins Other (See Comments)   Disputed in 2022 Has patient had a PCN reaction causing immediate rash, facial/tongue/throat swelling, SOB or lightheadedness with  hypotension: Unknown Has patient had a PCN reaction causing severe rash involving mucus membranes or skin necrosis: Unknown Has patient had a PCN reaction that required hospitalization: Unknown Has patient had a PCN reaction occurring within the last 10 years: No If all of the above answers are "NO", then may proceed with Cephalosporin use.      Medication List    STOP taking these medications   cyclobenzaprine 10 MG tablet Commonly known as: FLEXERIL   diazepam 5 MG tablet Commonly known as: VALIUM   HYDROcodone-acetaminophen 5-325 MG tablet Commonly known as: NORCO/VICODIN   Jardiance 25 MG Tabs tablet Generic drug: empagliflozin   LORazepam 1 MG tablet Commonly known as: ATIVAN   meloxicam 7.5 MG tablet Commonly known as: Mobic   propranolol 10 MG tablet Commonly known as: INDERAL   ramipril 2.5 MG capsule Commonly known as: ALTACE   sertraline 50 MG tablet Commonly known as: ZOLOFT   traMADol 50 MG tablet Commonly known as: ULTRAM     TAKE these medications   aspirin EC 81 MG tablet Take 1 tablet (81 mg total) by mouth daily. What changed: when to take this   docusate sodium 100 MG capsule Commonly known as: COLACE Take 2 capsules (200 mg total) by mouth 2 (two) times daily.   furosemide 40 MG tablet Commonly known as: LASIX Take 1 tablet (40 mg total) by mouth daily. What changed: when to take this   isosorbide mononitrate 30 MG 24 hr tablet Commonly known as: IMDUR Take 1 tablet (30 mg total) by mouth daily.   metFORMIN 1000 MG tablet Commonly known as: GLUCOPHAGE Take 1,000 mg by mouth 2 (two) times daily.   metoprolol tartrate 25 MG tablet Commonly known as: LOPRESSOR Take 5 tablets (125 mg total) by mouth 2 (two) times daily. What changed:   medication strength  how much to take   multivitamin with minerals Tabs tablet Take 1 tablet by mouth daily. Start taking on: January 28, 2021   nitroGLYCERIN 0.4 MG SL tablet Commonly known as:  NITROSTAT Place 1 tablet (0.4 mg total) under the tongue every 5 (five) minutes as needed for chest pain.   polyethylene glycol 17 g packet Commonly known as: MIRALAX / GLYCOLAX Take 17 g by mouth daily as needed.   QUEtiapine 25 MG tablet Commonly known as: SEROQUEL Take 1 tablet (25 mg total) by mouth 2 (two) times daily.   Resource ThickenUp Clear Powd Use with every meals.   senna-docusate 8.6-50 MG tablet Commonly known as: Senokot-S Take 1 tablet by mouth at bedtime as needed for mild constipation.   simvastatin 40 MG tablet Commonly known as: ZOCOR Take 1 tablet (40 mg total) by mouth at bedtime.   tamsulosin 0.4 MG Caps capsule Commonly known as: FLOMAX Take 1 capsule (0.4 mg total) by mouth daily. Start taking on: January 28, 2021   thiamine 100 MG tablet Take 1 tablet (100 mg total) by mouth daily. Start taking on: January 28, 2021   vitamin B-12 1000 MCG tablet Commonly known as: CYANOCOBALAMIN Take 1 tablet (1,000 mcg total) by mouth daily.       Contact information for follow-up providers    Ginger Organ., MD. Schedule an appointment as soon as possible for a visit in 1 week(s).  Specialty: Internal Medicine Why: monitor B12 levels Contact information: [place] [address] [phone]        Burnell Blanks, MD. Schedule an appointment as soon as possible for a visit in 1 week(s).   Specialty: Cardiology Why: +ve WMA on TTE Contact information: [place] [place] [address] [phone]            Contact information for after-discharge care    Destination    [facility] Preferred SNF .   Service: Skilled Nursing Contact information: [place] [phone]                 Allergies  Allergen Reactions  . Penicillins Other (See Comments)    Disputed in 2022 Has patient had a PCN reaction causing immediate rash,  facial/tongue/throat swelling, SOB or lightheadedness with hypotension: Unknown Has patient had a PCN reaction causing severe rash involving mucus membranes or skin necrosis: Unknown Has patient had a PCN reaction that required hospitalization: Unknown Has patient had a PCN reaction occurring within the last 10 years: No If all of the above answers are "NO", then may proceed with Cephalosporin use.     Consultations:  General surgery  Neurology.    Procedures/Studies: CT Head Wo Contrast  Result Date: 01/12/2021 CLINICAL DATA:  Slurred speech, weakness, confusion for a month. EXAM: CT HEAD WITHOUT CONTRAST CT CERVICAL SPINE WITHOUT CONTRAST TECHNIQUE: Multidetector CT imaging of the head and cervical spine was performed following the standard protocol without intravenous contrast. Multiplanar CT image reconstructions of the cervical spine were also generated. COMPARISON:  None. FINDINGS: CT HEAD FINDINGS Brain: Patchy and confluent areas of decreased attenuation are noted throughout the deep and periventricular white matter of the cerebral hemispheres bilaterally, compatible with chronic microvascular ischemic disease. No evidence of large-territorial acute infarction. No parenchymal hemorrhage. No mass lesion. No extra-axial collection. No mass effect or midline shift. No hydrocephalus. Basilar cisterns are patent. Vascular: No hyperdense vessel. Atherosclerotic calcifications are present within the cavernous internal carotid arteries. Skull: No acute fracture or focal lesion. Sinuses/Orbits: Paranasal sinuses and mastoid air cells are clear. The orbits are unremarkable. Other: None. CT CERVICAL SPINE FINDINGS Alignment: Reversal of the normal cervical lordosis centered at the C6 level. Findings due to severe degenerative changes at the level. Otherwise normal alignment. Skull base and vertebrae: Multilevel degenerative changes of the spine that are most prominent at the C1-C2 and C5 through C7  levels. No acute fracture. No aggressive appearing focal osseous lesion or focal pathologic process. Soft tissues and spinal canal: No prevertebral fluid or swelling. No visible canal hematoma. Upper chest: Unremarkable. Other: None. IMPRESSION: 1. No acute intracranial abnormality. 2. No acutely displaced fracture or traumatic listhesis of the cervical spine. Electronically Signed   By: Iven Finn M.D.   On: 01/12/2021 16:37   CT Cervical Spine Wo Contrast  Result Date: 01/12/2021 CLINICAL DATA:  Slurred speech, weakness, confusion for a month. EXAM: CT HEAD WITHOUT CONTRAST CT CERVICAL SPINE WITHOUT CONTRAST TECHNIQUE: Multidetector CT imaging of the head and cervical spine was performed following the standard protocol without intravenous contrast. Multiplanar CT image reconstructions of the cervical spine were also generated. COMPARISON:  None. FINDINGS: CT HEAD FINDINGS Brain: Patchy and confluent areas of decreased attenuation are noted throughout the deep and periventricular white matter of the cerebral hemispheres bilaterally, compatible with chronic microvascular ischemic disease. No evidence of large-territorial acute infarction. No parenchymal hemorrhage. No mass  lesion. No extra-axial collection. No mass effect or midline shift. No hydrocephalus. Basilar cisterns are patent. Vascular: No hyperdense vessel. Atherosclerotic calcifications are present within the cavernous internal carotid arteries. Skull: No acute fracture or focal lesion. Sinuses/Orbits: Paranasal sinuses and mastoid air cells are clear. The orbits are unremarkable. Other: None. CT CERVICAL SPINE FINDINGS Alignment: Reversal of the normal cervical lordosis centered at the C6 level. Findings due to severe degenerative changes at the level. Otherwise normal alignment. Skull base and vertebrae: Multilevel degenerative changes of the spine that are most prominent at the C1-C2 and C5 through C7 levels. No acute fracture. No aggressive  appearing focal osseous lesion or focal pathologic process. Soft tissues and spinal canal: No prevertebral fluid or swelling. No visible canal hematoma. Upper chest: Unremarkable. Other: None. IMPRESSION: 1. No acute intracranial abnormality. 2. No acutely displaced fracture or traumatic listhesis of the cervical spine. Electronically Signed   By: Iven Finn M.D.   On: 01/12/2021 16:37   MR BRAIN WO CONTRAST  Result Date: 01/15/2021 CLINICAL DATA:  Headache, new or worsening.  Confusion. EXAM: MRI HEAD WITHOUT CONTRAST TECHNIQUE: Multiplanar, multiecho pulse sequences of the brain and surrounding structures were obtained without intravenous contrast. COMPARISON:  Head CT from 3 days ago. FINDINGS: Brain: Subcentimeter focus of weakly restricted diffusion in the parasagittal and subcortical right parietal lobe. Background of chronic small vessel ischemia with generalized ischemic gliosis in the cerebral white matter and pons. Small remote left cerebellar infarct. No masslike finding, hydrocephalus, atrophy, or collection. Vascular: Normal flow voids. Skull and upper cervical spine: Separate cervical MRI. No focal marrow lesion. Sinuses/Orbits: Negative Other: Truncated study due to patient confusion. IMPRESSION: 1. Small subacute infarct in the right parietal white matter, likely a current manifestation of the patient's extensive chronic small vessel ischemia. 2. No reversible finding. Electronically Signed   By: Monte Fantasia M.D.   On: 01/15/2021 12:01   MR CERVICAL SPINE WO CONTRAST  Result Date: 01/15/2021 CLINICAL DATA:  Confusion.  Frequent falls. EXAM: MRI CERVICAL SPINE WITHOUT CONTRAST TECHNIQUE: Multiplanar, multisequence MR imaging of the cervical spine was performed. No intravenous contrast was administered. COMPARISON:  CT cervical spine dated January 12, 2021. FINDINGS: Despite efforts by the technologist and patient, motion artifact is present on today's exam and could not be eliminated.  This reduces exam sensitivity and specificity. The examination was also ended early and the axial medic images were not obtained. Alignment: Unchanged reversal of the normal cervical lordosis and trace anterolisthesis at C5-C6. Vertebrae: No fracture, evidence of discitis, or bone lesion. Cord: Normal signal and morphology. Posterior Fossa, vertebral arteries, paraspinal tissues: Chronic microvascular ischemic changes in the pons. Otherwise negative. Disc levels: C2-C3:  Interbody and facet ankylosis.  No stenosis. C3-C4: No significant disc bulge or herniation. Mild-to-moderate bilateral facet arthropathy. Moderate right uncovertebral hypertrophy. Moderate right neuroforaminal stenosis. No spinal canal or left neuroforaminal stenosis. C4-C5: No significant disc bulge or herniation. Mild bilateral uncovertebral hypertrophy. Moderate left and mild right facet arthropathy. No stenosis. C5-C6: Severe disc height loss without significant disc bulge or herniation. Moderate bilateral facet uncovertebral hypertrophy. Mild bilateral neuroforaminal stenosis. No spinal canal stenosis. C6-C7: Mild disc bulging. Moderate bilateral uncovertebral hypertrophy. No stenosis. C7-T1: Negative disc. Moderate right and mild left facet arthropathy. No stenosis. The visualized upper thoracic spine is unremarkable. IMPRESSION: 1. Normal cervical spinal cord. 2. Multilevel degenerative changes of the cervical spine as described above. No spinal canal stenosis at any level. 3. Moderate right neuroforaminal stenosis at C3-C4. Electronically  Signed   By: Titus Dubin M.D.   On: 01/15/2021 12:26   CT CHEST ABDOMEN PELVIS W CONTRAST  Result Date: 01/22/2021 CLINICAL DATA:  Chest pain or shortness of breath. Sepsis. Effusion suspected. EXAM: CT CHEST, ABDOMEN, AND PELVIS WITH CONTRAST TECHNIQUE: Multidetector CT imaging of the chest, abdomen and pelvis was performed following the standard protocol during bolus administration of intravenous  contrast. CONTRAST:  153mL OMNIPAQUE IOHEXOL 300 MG/ML  SOLN COMPARISON:  Plain film chest of 1 day prior. [place] 517 CTA chest. No prior abdominopelvic CTs. FINDINGS: CT CHEST FINDINGS Cardiovascular: Mild motion degradation throughout. Exam also degraded by patient arm position, not raised above the head. Right subclavian artery stent or calcification. Aortic atherosclerosis. Mild cardiomegaly, without pericardial effusion. Multivessel coronary artery atherosclerosis. No central pulmonary embolism, on this non-dedicated study. Mediastinum/Nodes: No mediastinal or hilar adenopathy. Lungs/Pleura: No pleural fluid.  Mild centrilobular emphysema. Musculoskeletal: No acute osseous abnormality. CT ABDOMEN PELVIS FINDINGS Hepatobiliary: Normal liver. The gallbladder is mildly distended with suggestion of pericholecystic edema, including on 62/3 and 16/8. No calcified stone or biliary duct dilatation. Pancreas: Normal, without mass or ductal dilatation. Spleen: Normal in size, without focal abnormality. Adrenals/Urinary Tract: Mild left adrenal thickening. Normal right adrenal gland. Interpolar right renal 1.8 cm lesion measures on the order of 19 HU, favoring a minimally complex cyst. Inter/upper pole punctate right renal collecting system calculus. No hydronephrosis. Foley catheter within the urinary bladder. Stomach/Bowel: Normal stomach, without wall thickening. Moderate stool within the rectum, less so the remainder of the colon. Normal terminal ileum and appendix. Normal small bowel. Vascular/Lymphatic: Aortic atherosclerosis. No abdominopelvic adenopathy. Reproductive: Normal prostate. Other: No significant free fluid. No free intraperitoneal air. Right paramidline fat containing ventral pelvic wall hernia. Presumably iatrogenic subcutaneous air within the anterior pelvic wall. Musculoskeletal: Right hip osteoarthritis. Degenerative disc disease at the lumbosacral junction, presuming a transitional S1 vertebral  body. IMPRESSION: 1. Multifactorial degradation, including motion and patient arm position. 2. Mildly distended gallbladder with suggestion of pericholecystic edema. Correlate with right upper quadrant symptoms and consider ultrasound. 3. Right nephrolithiasis. 4. Possible constipation or even fecal impaction. 5. Aortic Atherosclerosis (ICD10-I70.0) and Emphysema (ICD10-J43.9). Coronary artery atherosclerosis. Electronically Signed   By: Abigail Miyamoto M.D.   On: 01/22/2021 15:03   NM Hepato W/EF  Result Date: 01/24/2021 CLINICAL DATA:  Biliary colic, gallbladder sludge and wall thickening on ultrasound EXAM: NUCLEAR MEDICINE HEPATOBILIARY IMAGING TECHNIQUE: Sequential images of the abdomen were obtained out to 60 minutes following intravenous administration of radiopharmaceutical. RADIOPHARMACEUTICALS:  5.25 mCi Tc-34m  Choletec IV COMPARISON:  Right upper quadrant ultrasound dated 01/22/2021. CT abdomen/pelvis dated 01/22/2021. FINDINGS: Prompt uptake and biliary excretion of activity by the liver is seen. Biliary activity passes into small bowel, consistent with patent common bile duct. Gallbladder not visualized at 60 minutes. Patient refused additional imaging. As such, patency of the cystic duct cannot be confirmed, and cholecystitis is not excluded. When correlating with prior studies, gallbladder was mildly distended with wall thickening on CT. No gallstones were evident on ultrasound. Overall clinical picture favors chronic cholecystitis over acute cholecystitis. IMPRESSION: Nonvisualization of the gallbladder, raising concern for cholecystitis, possibly chronic. These results will be called to the ordering clinician or representative by the Radiologist Assistant, and communication documented in the PACS or [employer]. Electronically Signed   By: Julian Hy M.D.   On: 01/24/2021 12:33   DG Chest Port 1 View  Result Date: 01/21/2021 CLINICAL DATA:  Shortness of breath. EXAM: PORTABLE CHEST  1  VIEW COMPARISON:  January 20, 2021 FINDINGS: The heart size and mediastinal contours are within normal limits. Mild linear atelectasis is identified in the left lung base. There is no focal pneumonia, pulmonary edema or pleural effusion. The visualized skeletal structures are stable. IMPRESSION: Mild linear atelectasis in the left lung base.  No focal pneumonia. Electronically Signed   By: Abelardo Diesel M.D.   On: 01/21/2021 08:44   DG Chest Port 1 View  Result Date: 01/20/2021 CLINICAL DATA:  Dyspnea, chest pain EXAM: PORTABLE CHEST 1 VIEW COMPARISON:  01/17/2021 FINDINGS: Lungs volumes are small, but are symmetric and are clear. No pneumothorax or pleural effusion. Cardiac size within normal limits. Pulmonary vascularity is normal. Osseous structures are age-appropriate. No acute bone abnormality. IMPRESSION: No active disease. Electronically Signed   By: Fidela Salisbury MD   On: 01/20/2021 08:09   DG Chest Port 1 View  Result Date: 01/17/2021 CLINICAL DATA:  Short of breath and nausea EXAM: PORTABLE CHEST 1 VIEW COMPARISON:  01/17/2021 FINDINGS: Single frontal view of the chest demonstrates an unremarkable cardiac silhouette. Lung volumes are diminished with crowding of the central vasculature. There is developing consolidation within the medial right lung base which could reflect airspace disease or atelectasis. No effusion or pneumothorax. IMPRESSION: 1. Low lung volumes, with developing consolidation medial right lung base favor atelectasis. 2. Continued central vascular congestion. Electronically Signed   By: Randa Ngo M.D.   On: 01/17/2021 17:50   DG Chest Port 1 View  Result Date: 01/17/2021 CLINICAL DATA:  Shortness of breath. EXAM: PORTABLE CHEST 1 VIEW COMPARISON:  05/12/2016 FINDINGS: Single view of the chest demonstrates low lung volumes. Heart size is normal but there is fullness of the central vascular structures. No discrete airspace disease or lung consolidation. Negative for a  pneumothorax. IMPRESSION: 1. Low lung volumes without focal lung disease. 2. Slight prominence of the central vascular structures including the azygos shadow. These findings may be accentuated by the low lung volumes. Electronically Signed   By: Markus Daft M.D.   On: 01/17/2021 08:15   DG Knee Complete 4 Views Right  Result Date: 01/12/2021 CLINICAL DATA:  Atraumatic right knee pain. EXAM: RIGHT KNEE - COMPLETE 4+ VIEW COMPARISON:  January 07, 2018 FINDINGS: A right knee replacement is seen without evidence of surrounding lucency to suggest the presence of hardware loosening or infection. No evidence of an acute fracture or dislocation. A small joint effusion is seen. IMPRESSION: 1. Right knee replacement without evidence of hardware loosening or infection. 2. Small joint effusion. Electronically Signed   By: Virgina Norfolk M.D.   On: 01/12/2021 19:39   DG Abd Portable 1V  Result Date: 01/17/2021 CLINICAL DATA:  Short of breath, nausea EXAM: PORTABLE ABDOMEN - 1 VIEW COMPARISON:  01/06/2014 FINDINGS: Supine frontal view of the abdomen and pelvis was performed, excluding the left flank and upper abdomen by collimation. Bowel gas pattern is unremarkable without obstruction or ileus. No masses or abnormal calcifications. Moderate right hip osteoarthritis with joint space narrowing, bony remodeling of the femoral head, and marked marginal osteophyte formation. This has progressed significantly since prior study. Stable mild left hip osteoarthritis. IMPRESSION: 1. Unremarkable bowel gas pattern. 2. Moderate to severe progressive right hip osteoarthritis. Electronically Signed   By: Randa Ngo M.D.   On: 01/17/2021 17:49   EEG adult  Result Date: 01/23/2021 Lora Havens, MD     01/23/2021 10:18 AM Patient Name: AMADI SIBAYAN MRN: [id number] Epilepsy Attending: Lora Havens  Referring Physician/Provider: Dr Lala Lund Date: 01/23/2021 Duration: 22.30 mins Patient history: 71 year old male with  altered mental status.  EEG to evaluate for seizures. Level of alertness: lethargic, asleep AEDs during EEG study: None Technical aspects: This EEG study was done with scalp electrodes positioned according to the 10-20 International system of electrode placement. Electrical activity was acquired at a sampling rate of 500Hz  and reviewed with a high frequency filter of 70Hz  and a low frequency filter of 1Hz . EEG data were recorded continuously and digitally stored. Description: No clear posterior dominant rhythm was seen. Sleep was characterized by vertex waves, sleep spindles (12 to 14 Hz), maximal frontocentral region. EEG showed continuous generalized 5 to 7 Hz theta as well as intermittent 2-3Hz  delta slowing. Physiologic photic driving was not seen during photic stimulation. Hyperventilation was not performed.   ABNORMALITY - Continuous slow, generalized IMPRESSION: This study is suggestive of moderate diffuse encephalopathy, nonspecific etiology. No seizures or epileptiform discharges were seen throughout the recording. Lora Havens   ECHOCARDIOGRAM COMPLETE  Result Date: 01/13/2021    ECHOCARDIOGRAM REPORT   Patient Name:   MANNIE LASO Date of Exam: 01/13/2021 Medical Rec #:  [id number]      Height:       68.0 in Accession #:    [id number]     Weight:       225.0 lb Date of Birth:  [birth year]     BSA:          2.149 m Patient Age:    56 years       BP:           126/72 mmHg Patient Gender: M              HR:           96 bpm. Exam Location:  Inpatient Procedure: 2D Echo, Intracardiac Opacification Agent, Cardiac Doppler and Color            Doppler Indications:    TIA  History:        Patient has no prior history of Echocardiogram examinations. CAD                 and Acute MI; Risk Factors:Hypertension, Dyslipidemia and                 Diabetes.  Sonographer:    Luisa Hart RDCS Referring Phys: [id number] Lequita Halt  Sonographer Comments: Suboptimal apical window. IMPRESSIONS  1. Left ventricular  ejection fraction, by estimation, is 45 to 50%. The left ventricle has mildly decreased function. The left ventricle demonstrates regional wall motion abnormalities (see scoring diagram/findings for description). Apical akinesis. Left ventricular diastolic parameters are consistent with Grade I diastolic dysfunction (impaired relaxation).  2. Swirling of contrast at apex consistent with low flow but no thrombus seen  3. Right ventricular systolic function is normal. The right ventricular size is normal. Tricuspid regurgitation signal is inadequate for assessing PA pressure.  4. The mitral valve is normal in structure. No evidence of mitral valve regurgitation.  5. The aortic valve is tricuspid. Aortic valve regurgitation is trivial. Mild to moderate aortic valve sclerosis/calcification is present, without any evidence of aortic stenosis.  6. The inferior vena cava is normal in size with greater than 50% respiratory variability, suggesting right atrial pressure of 3 mmHg. FINDINGS  Left Ventricle: Left ventricular ejection fraction, by estimation, is 45 to 50%. The left ventricle has mildly decreased function. The left ventricle demonstrates regional wall motion  abnormalities. Definity contrast agent was given IV to delineate the left ventricular endocardial borders. The left ventricular internal cavity size was normal in size. There is no left ventricular hypertrophy. Left ventricular diastolic parameters are consistent with Grade I diastolic dysfunction (impaired relaxation).  LV Wall Scoring: The entire apex is akinetic. The anterior wall, antero-lateral wall, anterior septum, inferior wall, posterior wall, mid inferoseptal segment, and basal inferoseptal segment are normal. Right Ventricle: The right ventricular size is normal. No increase in right ventricular wall thickness. Right ventricular systolic function is normal. Tricuspid regurgitation signal is inadequate for assessing PA pressure. Left Atrium: Left  atrial size was normal in size. Right Atrium: Right atrial size was normal in size. Pericardium: There is no evidence of pericardial effusion. Mitral Valve: The mitral valve is normal in structure. No evidence of mitral valve regurgitation. Tricuspid Valve: The tricuspid valve is normal in structure. Tricuspid valve regurgitation is trivial. Aortic Valve: The aortic valve is tricuspid. Aortic valve regurgitation is trivial. Aortic regurgitation PHT measures 446 msec. Mild to moderate aortic valve sclerosis/calcification is present, without any evidence of aortic stenosis. Aortic valve mean gradient measures 4.0 mmHg. Aortic valve peak gradient measures 7.4 mmHg. Aortic valve area, by VTI measures 4.43 cm. Pulmonic Valve: The pulmonic valve was not well visualized. Pulmonic valve regurgitation is not visualized. Aorta: The aortic root and ascending aorta are structurally normal, with no evidence of dilitation. Venous: The inferior vena cava is normal in size with greater than 50% respiratory variability, suggesting right atrial pressure of 3 mmHg. IAS/Shunts: The interatrial septum was not well visualized.  LEFT VENTRICLE PLAX 2D LVIDd:         5.10 cm  Diastology LVIDs:         4.00 cm  LV e' medial:    4.79 cm/s LV PW:         0.70 cm  LV E/e' medial:  14.5 LV IVS:        0.70 cm  LV e' lateral:   9.14 cm/s LVOT diam:     2.70 cm  LV E/e' lateral: 7.6 LV SV:         110 LV SV Index:   51 LVOT Area:     5.73 cm  RIGHT VENTRICLE RV S prime:     9.79 cm/s  PULMONARY VEINS TAPSE (M-mode): 2.1 cm     A Reversal Duration: 100.00 msec                            A Reversal Velocity: 29.70 cm/s                            Diastolic Velocity:  0000000 cm/s                            S/D Velocity:        1.40                            Systolic Velocity:   [id number] cm/s LEFT ATRIUM             Index LA diam:        2.80 cm 1.30 cm/m LA Vol (A2C):   36.8 ml 17.13 ml/m LA Vol (A4C):   17.1 ml 7.96 ml/m LA Biplane Vol: 26.0 ml  12.10  ml/m  AORTIC VALVE                   PULMONIC VALVE AV Area (Vmax):    4.16 cm    PV Vmax:       0.76 m/s AV Area (Vmean):   4.51 cm    PV Vmean:      55.300 cm/s AV Area (VTI):     4.43 cm    PV VTI:        0.140 m AV Vmax:           136.00 cm/s PV Peak grad:  2.3 mmHg AV Vmean:          86.400 cm/s PV Mean grad:  1.0 mmHg AV VTI:            0.248 m AV Peak Grad:      7.4 mmHg AV Mean Grad:      4.0 mmHg LVOT Vmax:         98.80 cm/s LVOT Vmean:        68.000 cm/s LVOT VTI:          0.192 m LVOT/AV VTI ratio: 0.77 AI PHT:            446 msec  AORTA Ao Root diam: 3.50 cm Ao Asc diam:  3.50 cm MITRAL VALVE MV Area (PHT): 5.13 cm     SHUNTS MV Decel Time: 148 msec     Systemic VTI:  0.19 m MR Peak grad: 61.2 mmHg     Systemic Diam: 2.70 cm MR Vmax:      391.00 cm/s MV E velocity: 69.40 cm/s MV A velocity: 129.00 cm/s MV E/A ratio:  0.54 Oswaldo Milian MD Electronically signed by Oswaldo Milian MD Signature Date/Time: 01/13/2021/11:23:19 AM    Final    VAS [place] LOWER EXTREMITY VENOUS (DVT)  Result Date: 01/23/2021  Lower Venous DVT Study Indications: Fever.  Limitations: Altered mental status, patient in restraints. Comparison Study: No prior study Performing Technologist: Sharion Dove RVS  Examination Guidelines: A complete evaluation includes B-mode imaging, spectral Doppler, color Doppler, and power Doppler as needed of all accessible portions of each vessel. Bilateral testing is considered an integral part of a complete examination. Limited examinations for reoccurring indications may be performed as noted. The reflux portion of the exam is performed with the patient in reverse Trendelenburg.  +---------+---------------+---------+-----------+----------+--------------+ RIGHT    CompressibilityPhasicitySpontaneityPropertiesThrombus Aging +---------+---------------+---------+-----------+----------+--------------+ CFV      Full           Yes      Yes                                  +---------+---------------+---------+-----------+----------+--------------+ SFJ      Full                                                        +---------+---------------+---------+-----------+----------+--------------+ FV Prox  Full                                                        +---------+---------------+---------+-----------+----------+--------------+ FV Mid  Full                                                        +---------+---------------+---------+-----------+----------+--------------+ FV DistalFull                                                        +---------+---------------+---------+-----------+----------+--------------+ PFV      Full                                                        +---------+---------------+---------+-----------+----------+--------------+ POP      Full           Yes      Yes                                 +---------+---------------+---------+-----------+----------+--------------+ PTV      Full                                                        +---------+---------------+---------+-----------+----------+--------------+ PERO     Full                                                        +---------+---------------+---------+-----------+----------+--------------+   +---------+---------------+---------+-----------+----------+--------------+ LEFT     CompressibilityPhasicitySpontaneityPropertiesThrombus Aging +---------+---------------+---------+-----------+----------+--------------+ CFV      Full           Yes      Yes                                 +---------+---------------+---------+-----------+----------+--------------+ SFJ      Full                                                        +---------+---------------+---------+-----------+----------+--------------+ FV Prox  Full                                                         +---------+---------------+---------+-----------+----------+--------------+ FV Mid   Full                                                        +---------+---------------+---------+-----------+----------+--------------+  FV DistalFull                                                        +---------+---------------+---------+-----------+----------+--------------+ PFV      Full                                                        +---------+---------------+---------+-----------+----------+--------------+ POP      Full           Yes      Yes                                 +---------+---------------+---------+-----------+----------+--------------+ PTV      Full                                                        +---------+---------------+---------+-----------+----------+--------------+ PERO     Full                                                        +---------+---------------+---------+-----------+----------+--------------+     Summary: RIGHT: - There is no evidence of deep vein thrombosis in the lower extremity. However, portions of this examination were limited- see technologist comments above.  LEFT: - There is no evidence of deep vein thrombosis in the lower extremity. However, portions of this examination were limited- see technologist comments above.  *See table(s) above for measurements and observations. Electronically signed by Ruta Hinds MD on 01/23/2021 at 12:52:59 PM.    Final    US Abdomen Limited RUQ (LIVER/GB)  Result Date: 01/22/2021 CLINICAL DATA:  Abdominal pain and suspicious findings on recent CT examination. EXAM: ULTRASOUND ABDOMEN LIMITED RIGHT UPPER QUADRANT COMPARISON:  CT from earlier in the same day. FINDINGS: Gallbladder: Gallbladder is well distended with sludge within. No pericholecystic fluid is noted. Mild wall thickening is noted to 5 mm. No cholelithiasis is seen. Common bile duct: Diameter: 6.2 mm. Liver: Mild fatty  infiltration of the liver is noted. Portal vein is patent on color Doppler imaging with normal direction of blood flow towards the liver. Other: None. IMPRESSION: Gallbladder sludge without evidence of cholelithiasis. Some wall thickening is noted. These changes could represent some chronic cholecystitis. HIDA scan may be helpful in this regard. Electronically Signed   By: Inez Catalina M.D.   On: 01/22/2021 20:40      Subjective: No new complaints.   Discharge Exam: Vitals:   01/27/21 0900 01/27/21 1116  BP:  107/74  Pulse:  90  Resp:  20  Temp: 98.3 F (36.8 C) 98.6 F (37 C)  SpO2: 96% 95%   Vitals:   01/27/21 0503 01/27/21 0734 01/27/21 0900 01/27/21 1116  BP: 131/78 132/79  107/74  Pulse: 91 (!) 105  90  Resp: (!) 21  20  20  Temp: 98.5 F (36.9 C)  98.3 F (36.8 C) 98.6 F (37 C)  TempSrc: Oral  Oral Oral  SpO2: 97%  96% 95%  Weight:      Height:        General: Pt is alert, awake, not in acute distress Cardiovascular: RRR, S1/S2 +, no rubs, no gallops Respiratory: CTA bilaterally, no wheezing, no rhonchi Abdominal: Soft, NT, ND, bowel sounds + Extremities: no edema, no cyanosis    The results of significant diagnostics from this hospitalization (including imaging, microbiology, ancillary and laboratory) are listed below for reference.     Microbiology: Recent Results (from the past 240 hour(s))  Culture, Urine     Status: Abnormal   Collection Time: 01/17/21  6:16 PM   Specimen: Urine, Random  Result Value Ref Range Status   Specimen Description URINE, RANDOM  Final   Special Requests   Final    NONE Performed at [facility], [address], [place], [place] [address]    Culture (A)  Final    >=100,000 COLONIES/mL MULTIPLE SPECIES PRESENT, SUGGEST RECOLLECTION   Report Status 01/19/2021 FINAL  Final  Culture, blood (routine x 2)     Status: None   Collection Time: 01/17/21  7:39 PM   Specimen: BLOOD RIGHT HAND  Result Value Ref Range Status    Specimen Description BLOOD RIGHT HAND  Final   Special Requests   Final    BOTTLES DRAWN AEROBIC AND ANAEROBIC Blood Culture adequate volume   Culture   Final    NO GROWTH 5 DAYS Performed at [facility], [place] [address], [place], [place] [address]    Report Status 01/22/2021 FINAL  Final  Culture, blood (routine x 2)     Status: None   Collection Time: 01/17/21  7:42 PM   Specimen: BLOOD  Result Value Ref Range Status   Specimen Description BLOOD RIGHT ANTECUBITAL  Final   Special Requests   Final    BOTTLES DRAWN AEROBIC AND ANAEROBIC Blood Culture adequate volume   Culture   Final    NO GROWTH 5 DAYS Performed at [facility], [place] [address] [place], [place] [address]    Report Status 01/22/2021 FINAL  Final  Culture, Urine     Status: None   Collection Time: 01/21/21  7:44 AM   Specimen: Urine, Catheterized  Result Value Ref Range Status   Specimen Description URINE, CATHETERIZED  Final   Special Requests NONE  Final   Culture   Final    NO GROWTH Performed at [facility], [address], [place], [place] [address]    Report Status 01/22/2021 FINAL  Final  SARS CORONAVIRUS 2 (TAT 6-24 HRS) Nasopharyngeal Nasopharyngeal Swab     Status: None   Collection Time: 01/26/21  4:13 PM   Specimen: Nasopharyngeal Swab  Result Value Ref Range Status   SARS Coronavirus 2 NEGATIVE NEGATIVE Final    Comment: (NOTE) SARS-CoV-2 target nucleic acids are NOT DETECTED.  The SARS-CoV-2 RNA is generally detectable in upper and lower respiratory specimens during the acute phase of infection. Negative results do not preclude SARS-CoV-2 infection, do not rule out co-infections with other pathogens, and should not be used as the sole basis for treatment or other patient management decisions. Negative results must be combined with clinical observations, patient history, and epidemiological information. The expected result is Negative.  Fact Sheet for  Patients: SugarRoll.be  Fact Sheet for Healthcare  Providers: https://www.woods-mathews.com/  This test is not yet approved or cleared by the [place] and  has been authorized for detection and/or diagnosis of SARS-CoV-2 by FDA under an Emergency Use Authorization (EUA). This EUA will remain  in effect (meaning this test can be used) for the duration of the COVID-19 declaration under Se ction 564(b)(1) of the Act, 21 U.S.C. section 360bbb-3(b)(1), unless the authorization is terminated or revoked sooner.  Performed at [facility], [place] [address], [place], [place] [address]      Labs: BNP (last 3 results) Recent Labs    01/24/21 0353 01/25/21 0514 01/26/21 0238  BNP 242.2* 43.6 0000000   Basic Metabolic Panel: Recent Labs  Lab 01/22/21 0115 01/23/21 0506 01/24/21 0353 01/25/21 0514 01/26/21 0238  NA 132* 131* 136 132* 137  K 4.2 3.9 4.3 4.0 3.9  CL 101 99 102 99 102  CO2 21* 22 23 25 26   GLUCOSE 185* 156* 143* 154* 142*  BUN 17 11 12 11 8   CREATININE 0.67 0.68 0.67 0.74 0.74  CALCIUM 9.2 9.0 9.1 8.9 9.2  MG 2.0 2.1 2.2 2.3 2.4   Liver Function Tests: Recent Labs  Lab 01/22/21 0115 01/23/21 0506 01/24/21 0353 01/25/21 0514 01/26/21 0238  AST 39 25 49* 39 28  ALT 22 19 29 29 26   ALKPHOS 89 89 87 93 93  BILITOT 1.3* 1.3* 1.1 0.7 0.9  PROT 5.7* 5.6* 5.2* 5.0* 5.4*  ALBUMIN 2.5* 2.2* 2.1* 2.1* 2.3*   No results for input(s): LIPASE, AMYLASE in the last 168 hours. No results for input(s): AMMONIA in the last 168 hours. CBC: Recent Labs  Lab 01/22/21 0115 01/23/21 0506 01/24/21 0353 01/25/21 0514 01/26/21 0238  WBC 10.6* 11.5* 11.4* 12.4* 9.0  NEUTROABS 7.9* 8.2* 8.0* 8.1* 6.0  HGB 13.5 13.9 13.7 12.7* 13.7  HCT 40.5 39.6 40.1 37.6* 40.7  MCV 91.6 87.8 89.9 90.2 90.6  PLT 216 230 177 240 287   Cardiac Enzymes: No results for input(s): CKTOTAL, CKMB, CKMBINDEX, TROPONINI in the last 168  hours. BNP: Invalid input(s): POCBNP CBG: Recent Labs  Lab 01/26/21 1145 01/26/21 1639 01/26/21 2116 01/27/21 0632 01/27/21 1115  GLUCAP 201* 145* 221* 167* 247*   D-Dimer No results for input(s): DDIMER in the last 72 hours. Hgb A1c No results for input(s): HGBA1C in the last 72 hours. Lipid Profile No results for input(s): CHOL, HDL, LDLCALC, TRIG, CHOLHDL, LDLDIRECT in the last 72 hours. Thyroid function studies No results for input(s): TSH, T4TOTAL, T3FREE, THYROIDAB in the last 72 hours.  Invalid input(s): FREET3 Anemia work up No results for input(s): VITAMINB12, FOLATE, FERRITIN, TIBC, IRON, RETICCTPCT in the last 72 hours. Urinalysis    Component Value Date/Time   COLORURINE AMBER (A) 01/23/2021 1201   APPEARANCEUR HAZY (A) 01/23/2021 1201   LABSPEC 1.029 01/23/2021 1201   PHURINE 5.0 01/23/2021 1201   GLUCOSEU >=500 (A) 01/23/2021 1201   HGBUR SMALL (A) 01/23/2021 1201   BILIRUBINUR NEGATIVE 01/23/2021 1201   KETONESUR 80 (A) 01/23/2021 1201   PROTEINUR 30 (A) 01/23/2021 1201   UROBILINOGEN 0.2 01/06/2014 1257   NITRITE NEGATIVE 01/23/2021 1201   LEUKOCYTESUR TRACE (A) 01/23/2021 1201   Sepsis Labs Invalid input(s): PROCALCITONIN,  WBC,  LACTICIDVEN Microbiology Recent Results (from the past 240 hour(s))  Culture, Urine     Status: Abnormal   Collection Time: 01/17/21  6:16 PM   Specimen: Urine, Random  Result Value Ref Range Status   Specimen Description URINE, RANDOM  Final   Special Requests   Final    NONE Performed at [facility], [place] [address], [place], [place] [address]    Culture (A)  Final    >=100,000 COLONIES/mL MULTIPLE SPECIES PRESENT, SUGGEST RECOLLECTION   Report Status 01/19/2021 FINAL  Final  Culture, blood (routine x 2)     Status: None   Collection Time: 01/17/21  7:39 PM   Specimen: BLOOD RIGHT HAND  Result Value Ref Range Status   Specimen Description BLOOD RIGHT HAND  Final   Special Requests   Final    BOTTLES  DRAWN AEROBIC AND ANAEROBIC Blood Culture adequate volume   Culture   Final    NO GROWTH 5 DAYS Performed at [facility], [place] [address] [place], [place] [address]    Report Status 01/22/2021 FINAL  Final  Culture, blood (routine x 2)     Status: None   Collection Time: 01/17/21  7:42 PM   Specimen: BLOOD  Result Value Ref Range Status   Specimen Description BLOOD RIGHT ANTECUBITAL  Final   Special Requests   Final    BOTTLES DRAWN AEROBIC AND ANAEROBIC Blood Culture adequate volume   Culture   Final    NO GROWTH 5 DAYS Performed at [facility], [place] [address], [place], [place] [address]    Report Status 01/22/2021 FINAL  Final  Culture, Urine     Status: None   Collection Time: 01/21/21  7:44 AM   Specimen: Urine, Catheterized  Result Value Ref Range Status   Specimen Description URINE, CATHETERIZED  Final   Special Requests NONE  Final   Culture   Final    NO GROWTH Performed at [facility], [address] [place], [place] [address]    Report Status 01/22/2021 FINAL  Final  SARS CORONAVIRUS 2 (TAT 6-24 HRS) Nasopharyngeal Nasopharyngeal Swab     Status: None   Collection Time: 01/26/21  4:13 PM   Specimen: Nasopharyngeal Swab  Result Value Ref Range Status   SARS Coronavirus 2 NEGATIVE NEGATIVE Final    Comment: (NOTE) SARS-CoV-2 target nucleic acids are NOT DETECTED.  The SARS-CoV-2 RNA is generally detectable in upper and lower respiratory specimens during the acute phase of infection. Negative results do not preclude SARS-CoV-2 infection, do not rule out co-infections with other pathogens, and should not be used as the sole basis for treatment or other patient management decisions. Negative results must be combined with clinical observations, patient history, and epidemiological information. The expected result is Negative.  Fact Sheet for Patients: SugarRoll.be  Fact Sheet for Healthcare  Providers: https://www.woods-mathews.com/  This test is not yet approved or cleared by the [place] FDA and  has been authorized for detection and/or diagnosis of SARS-CoV-2 by FDA under an Emergency Use Authorization (EUA). This EUA will remain  in effect (meaning this test can be used) for the duration of the COVID-19 declaration under Se ction 564(b)(1) of the Act, 21 U.S.C. section 360bbb-3(b)(1), unless the authorization is terminated or revoked sooner.  Performed at [facility], [place] [address], [place], [place] [address]      Time coordinating discharge: 35 minutes.   SIGNED:   Hosie Poisson, MD  [facility] 01/27/2021, 1:00 PM

## 2021-01-27 NOTE — Plan of Care (Signed)
  Problem: Health Behavior/Discharge Planning: Goal: Ability to manage health-related needs will improve Outcome: Progressing   Problem: Coping: Goal: Will verbalize positive feelings about self Outcome: Progressing   Problem: Education: Goal: Knowledge of patient specific risk factors addressed and post discharge goals established will improve Outcome: Progressing   Problem: Education: Goal: Knowledge of secondary prevention will improve Outcome: Not Progressing   Problem: Education: Goal: Knowledge of disease or condition will improve Outcome: Progressing

## 2021-01-27 NOTE — Progress Notes (Signed)
[facility] Liaison note:  Notified by Daiva Nakayama, TOC of request by patient's wife Quindarius Cabello, for [facility] services. Will continue to follow for disposition.  Please call with any outpatient palliative questions or concerns.  Thank you, Lorelee Market, LPN [facility]iaison [phone]

## 2021-01-27 NOTE — Progress Notes (Signed)
Foley removed per DR order.

## 2021-01-27 NOTE — TOC Transition Note (Signed)
Transition of Care [facility]) - CM/SW Discharge Note   Patient Details  Name: Vincent [name] MRN: [id number] Date of Birth: 05/10/1951  [facility]) CM/SW Contact:  Geralynn Ochs, LCSW Phone Number: 01/27/2021, 4:18 PM   Clinical Narrative:   Nurse to call report to [phone], Room 3251    Final next level of care: [place] Barriers to Discharge: Barriers Resolved   Patient Goals and CMS Choice Patient states their goals for this hospitalization and ongoing recovery are:: Unable to assess, oriented x1 CMS Medicare.gov Compare Post Acute Care list provided to:: Patient Represenative (must comment) Choice offered to / list presented to : Spouse  Discharge Placement              Patient chooses bed at: [facility] Patient to be transferred to facility by: [facility] Name of family member notified: [name] Patient and family notified of of transfer: 01/27/21  Discharge Plan and Services                                     Social Determinants of Health (SDOH) Interventions     Readmission Risk Interventions No flowsheet data found.

## 2021-01-27 NOTE — Plan of Care (Signed)

## 2021-01-27 NOTE — Progress Notes (Addendum)
Patient dc'd and transported bu [facility]. Report given to Kim,RN at [facility]. Orders will be faxed over.

## 2021-01-27 NOTE — Progress Notes (Signed)
Physical Therapy Treatment Patient Details Name: Vincent [name] MRN: [id number] DOB: October 08, 1951 Today's Date: 01/27/2021    History of Present Illness 70 y/o male presented to ED at [facility] on 4/7 after being sent to [facility] for emergent MRI but unable to get to car. 2 weeks prior, patient had syncopal episode at PCP and has had 16 falls since. Patient presents with slurred speech, weakness, and confusion for one month. Unable to obtain MRI secondary to agitation. CT head and c-spine show chronic microvascular disease and severe C6 degenerative changes without acute process. PMH: anxiety, CAD, depression, DM type 2, HLD, HTN    PT Comments    Pt remains limited by agitation and LE pain. Pt is confused and continues to require reorientation multiple times during session. Pt needs significant physical assistance for all mobility and continues to benefit from acute PT services to reduce falls risk and caregiver burden. PT recommends SNF placement due to high falls risk.   Follow Up Recommendations  SNF     Equipment Recommendations  Wheelchair (measurements PT);Wheelchair cushion (measurements PT);Hospital bed (mechanical lift)    Recommendations for Other Services       Precautions / Restrictions Precautions Precautions: Fall Restrictions Weight Bearing Restrictions: No    Mobility  Bed Mobility Overal bed mobility: Needs Assistance Bed Mobility: Rolling Rolling: Max assist         General bed mobility comments: pt requires max cues, often aborting roll due to pain. PT attempts 8 rolls with patient, only successfully completing 1 toward R side    Transfers                    Ambulation/Gait                 Stairs             Wheelchair Mobility    Modified Rankin (Stroke Patients Only) Modified Rankin (Stroke Patients Only) Pre-Morbid Rankin Score: Moderate disability Modified Rankin: Severe disability     Balance                                             Cognition Arousal/Alertness: Lethargic;Awake/alert (initially lethargic, remains alert with stimulation) Behavior During Therapy: Agitated Overall Cognitive Status: Impaired/Different from baseline Area of Impairment: Orientation;Attention;Following commands;Safety/judgement;Memory;Awareness;Problem solving                 Orientation Level: Disoriented to;Place;Time;Situation Current Attention Level: Focused Memory: Decreased recall of precautions;Decreased short-term memory Following Commands: Follows one step commands inconsistently Safety/Judgement: Decreased awareness of safety;Decreased awareness of deficits Awareness: Intellectual Problem Solving: Slow processing;Decreased initiation;Difficulty sequencing        Exercises General Exercises - Lower Extremity Heel Slides: AAROM;Both;5 reps    General Comments General comments (skin integrity, edema, etc.): VSS on RA      Pertinent Vitals/Pain Pain Assessment: Faces Faces Pain Scale: Hurts even more Pain Location: LLE, particularly knee Pain Descriptors / Indicators: Grimacing;Moaning Pain Intervention(s): Monitored during session    Home Living                      Prior Function            PT Goals (current goals can now be found in the care plan section) Acute Rehab PT Goals Patient Stated Goal: to get out of here PT Goal Formulation:  With patient/family Time For Goal Achievement: 02/10/21 Potential to Achieve Goals: Poor Progress towards PT goals: Not progressing toward goals - comment (agitation and confusion limiting)    Frequency    Min 2X/week      PT Plan Frequency needs to be updated    Co-evaluation              AM-PAC PT "6 Clicks" Mobility   Outcome Measure  Help needed turning from your back to your side while in a flat bed without using bedrails?: A Lot Help needed moving from lying on your back to sitting on the side of a flat  bed without using bedrails?: Total Help needed moving to and from a bed to a chair (including a wheelchair)?: Total Help needed standing up from a chair using your arms (e.g., wheelchair or bedside chair)?: Total Help needed to walk in hospital room?: Total Help needed climbing 3-5 steps with a railing? : Total 6 Click Score: 7    End of Session   Activity Tolerance: Patient limited by pain;Treatment limited secondary to agitation Patient left: in bed;with call bell/phone within reach;with bed alarm set Nurse Communication: Mobility status;Need for lift equipment PT Visit Diagnosis: Muscle weakness (generalized) (M62.81);Difficulty in walking, not elsewhere classified (R26.2);Other abnormalities of gait and mobility (R26.89)     Time: 9604-5409 PT Time Calculation (min) (ACUTE ONLY): 36 min  Charges:  $Therapeutic Activity: 23-37 mins                     Zenaida Niece, PT, DPT Acute Rehabilitation Pager: [phone]    Zenaida Niece 01/27/2021, 12:25 PM

## 2021-01-27 NOTE — TOC Progression Note (Signed)
Transition of Care [facility]) - Progression Note    Patient Details  Name: Vincent [name] MRN: [id number] Date of Birth: 19-Feb-1951  Transition of Care [facility] CM/SW [place],  Phone Number: 01/27/2021, 10:43 AM  Clinical Narrative:   CSW notified by MD that patient has remained out of restraints for 24 hours. CSW discussed with [facility], they will need him out of restraints for 48 before they can admit. CSW contacted [employer] to initiate authorization requests for SNF and PTAR. CSW to follow.    Expected Discharge Plan: Skilled Nursing Facility Barriers to Discharge: Continued Medical Work up,Insurance Authorization,Facility will not accept until restraint criteria met,Awaiting State Approval Forensic scientist)  Expected Discharge Plan and Services Expected Discharge Plan: [place] arrangements for the past 2 months: Single Family Home                                       Social Determinants of Health (SDOH) Interventions    Readmission Risk Interventions No flowsheet data found.

## 2021-02-03 DIAGNOSIS — I10 Essential (primary) hypertension: Secondary | ICD-10-CM | POA: Diagnosis not present

## 2021-02-03 DIAGNOSIS — R1310 Dysphagia, unspecified: Secondary | ICD-10-CM | POA: Diagnosis not present

## 2021-02-03 DIAGNOSIS — I639 Cerebral infarction, unspecified: Secondary | ICD-10-CM | POA: Diagnosis not present

## 2021-02-03 DIAGNOSIS — G8929 Other chronic pain: Secondary | ICD-10-CM | POA: Diagnosis not present

## 2021-02-03 DIAGNOSIS — G4733 Obstructive sleep apnea (adult) (pediatric): Secondary | ICD-10-CM | POA: Diagnosis not present

## 2021-02-03 DIAGNOSIS — E1122 Type 2 diabetes mellitus with diabetic chronic kidney disease: Secondary | ICD-10-CM | POA: Diagnosis not present

## 2021-02-03 DIAGNOSIS — I5042 Chronic combined systolic (congestive) and diastolic (congestive) heart failure: Secondary | ICD-10-CM | POA: Diagnosis not present

## 2021-02-03 DIAGNOSIS — G9340 Encephalopathy, unspecified: Secondary | ICD-10-CM | POA: Diagnosis not present

## 2021-02-05 ENCOUNTER — Emergency Department (HOSPITAL_COMMUNITY): Payer: HMO

## 2021-02-05 ENCOUNTER — Inpatient Hospital Stay (HOSPITAL_COMMUNITY)
Admission: EM | Admit: 2021-02-05 | Discharge: 2021-02-11 | DRG: 871 | Disposition: A | Discharge status: Skilled Nursing Facility | Payer: HMO | Source: Skilled Nursing Facility | Attending: Internal Medicine | Admitting: Internal Medicine

## 2021-02-05 DIAGNOSIS — R262 Difficulty in walking, not elsewhere classified: Secondary | ICD-10-CM | POA: Diagnosis not present

## 2021-02-05 DIAGNOSIS — E139 Other specified diabetes mellitus without complications: Secondary | ICD-10-CM | POA: Diagnosis not present

## 2021-02-05 DIAGNOSIS — R6520 Severe sepsis without septic shock: Secondary | ICD-10-CM | POA: Diagnosis not present

## 2021-02-05 DIAGNOSIS — Z8673 Personal history of transient ischemic attack (TIA), and cerebral infarction without residual deficits: Secondary | ICD-10-CM

## 2021-02-05 DIAGNOSIS — H9190 Unspecified hearing loss, unspecified ear: Secondary | ICD-10-CM | POA: Diagnosis present

## 2021-02-05 DIAGNOSIS — N390 Urinary tract infection, site not specified: Secondary | ICD-10-CM | POA: Diagnosis present

## 2021-02-05 DIAGNOSIS — Z6835 Body mass index (BMI) 35.0-35.9, adult: Secondary | ICD-10-CM

## 2021-02-05 DIAGNOSIS — F32A Depression, unspecified: Secondary | ICD-10-CM | POA: Diagnosis present

## 2021-02-05 DIAGNOSIS — R404 Transient alteration of awareness: Secondary | ICD-10-CM | POA: Diagnosis not present

## 2021-02-05 DIAGNOSIS — L89621 Pressure ulcer of left heel, stage 1: Secondary | ICD-10-CM | POA: Diagnosis present

## 2021-02-05 DIAGNOSIS — L89611 Pressure ulcer of right heel, stage 1: Secondary | ICD-10-CM | POA: Diagnosis present

## 2021-02-05 DIAGNOSIS — G9341 Metabolic encephalopathy: Secondary | ICD-10-CM | POA: Diagnosis present

## 2021-02-05 DIAGNOSIS — Z8249 Family history of ischemic heart disease and other diseases of the circulatory system: Secondary | ICD-10-CM | POA: Diagnosis not present

## 2021-02-05 DIAGNOSIS — I2510 Atherosclerotic heart disease of native coronary artery without angina pectoris: Secondary | ICD-10-CM | POA: Diagnosis present

## 2021-02-05 DIAGNOSIS — I110 Hypertensive heart disease with heart failure: Secondary | ICD-10-CM | POA: Diagnosis present

## 2021-02-05 DIAGNOSIS — R6521 Severe sepsis with septic shock: Secondary | ICD-10-CM | POA: Diagnosis present

## 2021-02-05 DIAGNOSIS — Z934 Other artificial openings of gastrointestinal tract status: Secondary | ICD-10-CM | POA: Diagnosis not present

## 2021-02-05 DIAGNOSIS — I5042 Chronic combined systolic (congestive) and diastolic (congestive) heart failure: Secondary | ICD-10-CM | POA: Diagnosis present

## 2021-02-05 DIAGNOSIS — A419 Sepsis, unspecified organism: Secondary | ICD-10-CM | POA: Diagnosis not present

## 2021-02-05 DIAGNOSIS — K8020 Calculus of gallbladder without cholecystitis without obstruction: Secondary | ICD-10-CM | POA: Diagnosis not present

## 2021-02-05 DIAGNOSIS — Z87891 Personal history of nicotine dependence: Secondary | ICD-10-CM | POA: Diagnosis not present

## 2021-02-05 DIAGNOSIS — R6889 Other general symptoms and signs: Secondary | ICD-10-CM | POA: Diagnosis not present

## 2021-02-05 DIAGNOSIS — Z96651 Presence of right artificial knee joint: Secondary | ICD-10-CM | POA: Diagnosis present

## 2021-02-05 DIAGNOSIS — G894 Chronic pain syndrome: Secondary | ICD-10-CM | POA: Diagnosis present

## 2021-02-05 DIAGNOSIS — M1990 Unspecified osteoarthritis, unspecified site: Secondary | ICD-10-CM | POA: Diagnosis present

## 2021-02-05 DIAGNOSIS — B9620 Unspecified Escherichia coli [E. coli] as the cause of diseases classified elsewhere: Secondary | ICD-10-CM | POA: Diagnosis present

## 2021-02-05 DIAGNOSIS — L89153 Pressure ulcer of sacral region, stage 3: Secondary | ICD-10-CM | POA: Diagnosis present

## 2021-02-05 DIAGNOSIS — R7989 Other specified abnormal findings of blood chemistry: Secondary | ICD-10-CM | POA: Diagnosis present

## 2021-02-05 DIAGNOSIS — J9811 Atelectasis: Secondary | ICD-10-CM | POA: Diagnosis not present

## 2021-02-05 DIAGNOSIS — K811 Chronic cholecystitis: Secondary | ICD-10-CM | POA: Diagnosis present

## 2021-02-05 DIAGNOSIS — K812 Acute cholecystitis with chronic cholecystitis: Secondary | ICD-10-CM | POA: Diagnosis present

## 2021-02-05 DIAGNOSIS — E538 Deficiency of other specified B group vitamins: Secondary | ICD-10-CM | POA: Diagnosis present

## 2021-02-05 DIAGNOSIS — E1165 Type 2 diabetes mellitus with hyperglycemia: Secondary | ICD-10-CM | POA: Diagnosis present

## 2021-02-05 DIAGNOSIS — R17 Unspecified jaundice: Secondary | ICD-10-CM | POA: Diagnosis not present

## 2021-02-05 DIAGNOSIS — F1210 Cannabis abuse, uncomplicated: Secondary | ICD-10-CM | POA: Diagnosis present

## 2021-02-05 DIAGNOSIS — K760 Fatty (change of) liver, not elsewhere classified: Secondary | ICD-10-CM | POA: Diagnosis present

## 2021-02-05 DIAGNOSIS — Z7984 Long term (current) use of oral hypoglycemic drugs: Secondary | ICD-10-CM

## 2021-02-05 DIAGNOSIS — F419 Anxiety disorder, unspecified: Secondary | ICD-10-CM | POA: Diagnosis present

## 2021-02-05 DIAGNOSIS — Z66 Do not resuscitate: Secondary | ICD-10-CM | POA: Diagnosis present

## 2021-02-05 DIAGNOSIS — R4781 Slurred speech: Secondary | ICD-10-CM | POA: Diagnosis not present

## 2021-02-05 DIAGNOSIS — I499 Cardiac arrhythmia, unspecified: Secondary | ICD-10-CM | POA: Diagnosis not present

## 2021-02-05 DIAGNOSIS — K5730 Diverticulosis of large intestine without perforation or abscess without bleeding: Secondary | ICD-10-CM | POA: Diagnosis not present

## 2021-02-05 DIAGNOSIS — Z79899 Other long term (current) drug therapy: Secondary | ICD-10-CM

## 2021-02-05 DIAGNOSIS — Z880 Allergy status to penicillin: Secondary | ICD-10-CM | POA: Diagnosis not present

## 2021-02-05 DIAGNOSIS — Z20822 Contact with and (suspected) exposure to covid-19: Secondary | ICD-10-CM | POA: Diagnosis present

## 2021-02-05 DIAGNOSIS — R402411 Glasgow coma scale score 13-15, in the field [EMT or ambulance]: Secondary | ICD-10-CM | POA: Diagnosis not present

## 2021-02-05 DIAGNOSIS — Z8719 Personal history of other diseases of the digestive system: Secondary | ICD-10-CM | POA: Diagnosis not present

## 2021-02-05 DIAGNOSIS — Z23 Encounter for immunization: Secondary | ICD-10-CM

## 2021-02-05 DIAGNOSIS — Z48815 Encounter for surgical aftercare following surgery on the digestive system: Secondary | ICD-10-CM | POA: Diagnosis not present

## 2021-02-05 DIAGNOSIS — Z9049 Acquired absence of other specified parts of digestive tract: Secondary | ICD-10-CM | POA: Diagnosis not present

## 2021-02-05 DIAGNOSIS — E669 Obesity, unspecified: Secondary | ICD-10-CM | POA: Diagnosis present

## 2021-02-05 DIAGNOSIS — Z7982 Long term (current) use of aspirin: Secondary | ICD-10-CM

## 2021-02-05 DIAGNOSIS — K8000 Calculus of gallbladder with acute cholecystitis without obstruction: Secondary | ICD-10-CM | POA: Diagnosis not present

## 2021-02-05 DIAGNOSIS — E119 Type 2 diabetes mellitus without complications: Secondary | ICD-10-CM | POA: Diagnosis not present

## 2021-02-05 DIAGNOSIS — R932 Abnormal findings on diagnostic imaging of liver and biliary tract: Secondary | ICD-10-CM | POA: Diagnosis not present

## 2021-02-05 DIAGNOSIS — I10 Essential (primary) hypertension: Secondary | ICD-10-CM | POA: Diagnosis not present

## 2021-02-05 DIAGNOSIS — R945 Abnormal results of liver function studies: Secondary | ICD-10-CM | POA: Diagnosis not present

## 2021-02-05 DIAGNOSIS — K828 Other specified diseases of gallbladder: Secondary | ICD-10-CM | POA: Diagnosis not present

## 2021-02-05 DIAGNOSIS — L89159 Pressure ulcer of sacral region, unspecified stage: Secondary | ICD-10-CM

## 2021-02-05 DIAGNOSIS — Z955 Presence of coronary angioplasty implant and graft: Secondary | ICD-10-CM

## 2021-02-05 DIAGNOSIS — E785 Hyperlipidemia, unspecified: Secondary | ICD-10-CM | POA: Diagnosis present

## 2021-02-05 DIAGNOSIS — E860 Dehydration: Secondary | ICD-10-CM | POA: Diagnosis present

## 2021-02-05 DIAGNOSIS — I700 Atherosclerosis of aorta: Secondary | ICD-10-CM | POA: Diagnosis not present

## 2021-02-05 DIAGNOSIS — M6281 Muscle weakness (generalized): Secondary | ICD-10-CM | POA: Diagnosis not present

## 2021-02-05 DIAGNOSIS — Z743 Need for continuous supervision: Secondary | ICD-10-CM | POA: Diagnosis not present

## 2021-02-05 DIAGNOSIS — R339 Retention of urine, unspecified: Secondary | ICD-10-CM | POA: Diagnosis not present

## 2021-02-05 LAB — CBG MONITORING, ED: Glucose-Capillary: 173 mg/dL — ABNORMAL HIGH (ref 70–99)

## 2021-02-05 MED ORDER — SODIUM CHLORIDE 0.9 % IV SOLN
2.0000 g | Freq: Once | INTRAVENOUS | Status: AC
  Administered 2021-02-06: 2 g via INTRAVENOUS
  Filled 2021-02-05: qty 2

## 2021-02-05 MED ORDER — METRONIDAZOLE 500 MG/100ML IV SOLN
500.0000 mg | Freq: Once | INTRAVENOUS | Status: DC
  Filled 2021-02-05: qty 100

## 2021-02-05 MED ORDER — VANCOMYCIN HCL 2000 MG/400ML IV SOLN
2000.0000 mg | Freq: Once | INTRAVENOUS | Status: AC
  Administered 2021-02-06: 2000 mg via INTRAVENOUS
  Filled 2021-02-05: qty 400

## 2021-02-05 MED ORDER — LACTATED RINGERS IV SOLN: INTRAVENOUS | Status: DC

## 2021-02-05 MED ORDER — VANCOMYCIN HCL IN DEXTROSE 1-5 GM/200ML-% IV SOLN: 1000.0000 mg | Freq: Once | INTRAVENOUS | Status: DC

## 2021-02-05 NOTE — ED Triage Notes (Signed)
Patient is from [facility]/ re[facility]  Arrived by EMS. AMS less responsive. More slurred speech LSN 1800, Nursing home noticed AMS 2100.  intial : 80/p HR 105 RR 30-40 Spo2: 94% CBG: 170  EMS gave 300cc NS GCS 13 Confused  ETCO2: 27 18 G L AC

## 2021-02-05 NOTE — Progress Notes (Signed)
A consult was received from an ED physician for Aztreonam & Vancomycin per pharmacy dosing.  The patient's profile has been reviewed for ht/wt/allergies/indication/available labs.   Patient recently tolerated Meropenem but has never had cephalosporins prescribed at [facility] facility.  A one time order has been placed for Aztreonam 2gm & Vancomycin 2gm IV.  Further antibiotics/pharmacy consults should be ordered by admitting physician if indicated.                       Thank you, Netta Cedars PharmD 02/05/2021  11:46 PM

## 2021-02-06 ENCOUNTER — Other Ambulatory Visit: Payer: Self-pay

## 2021-02-06 ENCOUNTER — Emergency Department (HOSPITAL_COMMUNITY): Payer: HMO

## 2021-02-06 ENCOUNTER — Encounter (HOSPITAL_COMMUNITY): Payer: Self-pay | Admitting: Emergency Medicine

## 2021-02-06 ENCOUNTER — Inpatient Hospital Stay (HOSPITAL_COMMUNITY): Payer: HMO

## 2021-02-06 DIAGNOSIS — Z20822 Contact with and (suspected) exposure to covid-19: Secondary | ICD-10-CM | POA: Diagnosis present

## 2021-02-06 DIAGNOSIS — Z8249 Family history of ischemic heart disease and other diseases of the circulatory system: Secondary | ICD-10-CM | POA: Diagnosis not present

## 2021-02-06 DIAGNOSIS — A419 Sepsis, unspecified organism: Secondary | ICD-10-CM | POA: Diagnosis present

## 2021-02-06 DIAGNOSIS — Z6835 Body mass index (BMI) 35.0-35.9, adult: Secondary | ICD-10-CM | POA: Diagnosis not present

## 2021-02-06 DIAGNOSIS — L89159 Pressure ulcer of sacral region, unspecified stage: Secondary | ICD-10-CM | POA: Diagnosis present

## 2021-02-06 DIAGNOSIS — F1210 Cannabis abuse, uncomplicated: Secondary | ICD-10-CM | POA: Diagnosis present

## 2021-02-06 DIAGNOSIS — Z96651 Presence of right artificial knee joint: Secondary | ICD-10-CM | POA: Diagnosis present

## 2021-02-06 DIAGNOSIS — K812 Acute cholecystitis with chronic cholecystitis: Secondary | ICD-10-CM | POA: Diagnosis present

## 2021-02-06 DIAGNOSIS — L89153 Pressure ulcer of sacral region, stage 3: Secondary | ICD-10-CM | POA: Diagnosis present

## 2021-02-06 DIAGNOSIS — R945 Abnormal results of liver function studies: Secondary | ICD-10-CM | POA: Diagnosis not present

## 2021-02-06 DIAGNOSIS — Z66 Do not resuscitate: Secondary | ICD-10-CM | POA: Diagnosis present

## 2021-02-06 DIAGNOSIS — I2510 Atherosclerotic heart disease of native coronary artery without angina pectoris: Secondary | ICD-10-CM | POA: Diagnosis present

## 2021-02-06 DIAGNOSIS — Z8719 Personal history of other diseases of the digestive system: Secondary | ICD-10-CM | POA: Diagnosis not present

## 2021-02-06 DIAGNOSIS — I110 Hypertensive heart disease with heart failure: Secondary | ICD-10-CM | POA: Diagnosis present

## 2021-02-06 DIAGNOSIS — I5042 Chronic combined systolic (congestive) and diastolic (congestive) heart failure: Secondary | ICD-10-CM | POA: Diagnosis present

## 2021-02-06 DIAGNOSIS — N390 Urinary tract infection, site not specified: Secondary | ICD-10-CM | POA: Diagnosis present

## 2021-02-06 DIAGNOSIS — Z955 Presence of coronary angioplasty implant and graft: Secondary | ICD-10-CM | POA: Diagnosis not present

## 2021-02-06 DIAGNOSIS — E139 Other specified diabetes mellitus without complications: Secondary | ICD-10-CM

## 2021-02-06 DIAGNOSIS — K811 Chronic cholecystitis: Secondary | ICD-10-CM | POA: Diagnosis present

## 2021-02-06 DIAGNOSIS — G9341 Metabolic encephalopathy: Secondary | ICD-10-CM | POA: Diagnosis present

## 2021-02-06 DIAGNOSIS — R932 Abnormal findings on diagnostic imaging of liver and biliary tract: Secondary | ICD-10-CM | POA: Diagnosis not present

## 2021-02-06 DIAGNOSIS — Z87891 Personal history of nicotine dependence: Secondary | ICD-10-CM | POA: Diagnosis not present

## 2021-02-06 DIAGNOSIS — R6521 Severe sepsis with septic shock: Secondary | ICD-10-CM | POA: Diagnosis present

## 2021-02-06 DIAGNOSIS — E1165 Type 2 diabetes mellitus with hyperglycemia: Secondary | ICD-10-CM | POA: Diagnosis present

## 2021-02-06 DIAGNOSIS — Z23 Encounter for immunization: Secondary | ICD-10-CM | POA: Diagnosis present

## 2021-02-06 DIAGNOSIS — E785 Hyperlipidemia, unspecified: Secondary | ICD-10-CM | POA: Diagnosis present

## 2021-02-06 DIAGNOSIS — Z8673 Personal history of transient ischemic attack (TIA), and cerebral infarction without residual deficits: Secondary | ICD-10-CM | POA: Diagnosis not present

## 2021-02-06 DIAGNOSIS — Z880 Allergy status to penicillin: Secondary | ICD-10-CM | POA: Diagnosis not present

## 2021-02-06 DIAGNOSIS — E669 Obesity, unspecified: Secondary | ICD-10-CM | POA: Diagnosis present

## 2021-02-06 DIAGNOSIS — B9620 Unspecified Escherichia coli [E. coli] as the cause of diseases classified elsewhere: Secondary | ICD-10-CM | POA: Diagnosis present

## 2021-02-06 LAB — RESP PANEL BY RT-PCR (FLU A&B, COVID) ARPGX2
Influenza A by PCR: NEGATIVE
Influenza B by PCR: NEGATIVE
SARS Coronavirus 2 by RT PCR: NEGATIVE

## 2021-02-06 LAB — CBC WITH DIFFERENTIAL/PLATELET
Abs Immature Granulocytes: 0.63 10*3/uL — ABNORMAL HIGH (ref 0.00–0.07)
Basophils Absolute: 0 10*3/uL (ref 0.0–0.1)
Basophils Relative: 0 %
Eosinophils Absolute: 0.1 10*3/uL (ref 0.0–0.5)
Eosinophils Relative: 0 %
HCT: 35.4 % — ABNORMAL LOW (ref 39.0–52.0)
Hemoglobin: 12 g/dL — ABNORMAL LOW (ref 13.0–17.0)
Immature Granulocytes: 3 %
Lymphocytes Relative: 5 %
Lymphs Abs: 1.3 10*3/uL (ref 0.7–4.0)
MCH: 30.2 pg (ref 26.0–34.0)
MCHC: 33.9 g/dL (ref 30.0–36.0)
MCV: 89.2 fL (ref 80.0–100.0)
Monocytes Absolute: 1.6 10*3/uL — ABNORMAL HIGH (ref 0.1–1.0)
Monocytes Relative: 7 %
Neutro Abs: 21.4 10*3/uL — ABNORMAL HIGH (ref 1.7–7.7)
Neutrophils Relative %: 85 %
Platelets: 280 10*3/uL (ref 150–400)
RBC: 3.97 MIL/uL — ABNORMAL LOW (ref 4.22–5.81)
RDW: 14.2 % (ref 11.5–15.5)
WBC: 25.1 10*3/uL — ABNORMAL HIGH (ref 4.0–10.5)
nRBC: 0 % (ref 0.0–0.2)

## 2021-02-06 LAB — TROPONIN I (HIGH SENSITIVITY)
Troponin I (High Sensitivity): 14 ng/L (ref <18)
Troponin I (High Sensitivity): 13 ng/L (ref <18)
Troponin I (High Sensitivity): 11 ng/L (ref <18)

## 2021-02-06 LAB — RAPID URINE DRUG SCREEN, HOSP PERFORMED
Amphetamines: NOT DETECTED
Barbiturates: NOT DETECTED
Benzodiazepines: NOT DETECTED
Cocaine: NOT DETECTED
Opiates: NOT DETECTED
Tetrahydrocannabinol: POSITIVE — AB

## 2021-02-06 LAB — COMPREHENSIVE METABOLIC PANEL
ALT: 153 U/L — ABNORMAL HIGH (ref 0–44)
ALT: 132 U/L — ABNORMAL HIGH (ref 0–44)
AST: 219 U/L — ABNORMAL HIGH (ref 15–41)
AST: 172 U/L — ABNORMAL HIGH (ref 15–41)
Albumin: 2.7 g/dL — ABNORMAL LOW (ref 3.5–5.0)
Albumin: 2.3 g/dL — ABNORMAL LOW (ref 3.5–5.0)
Alkaline Phosphatase: 403 U/L — ABNORMAL HIGH (ref 38–126)
Alkaline Phosphatase: 341 U/L — ABNORMAL HIGH (ref 38–126)
Anion gap: 16 — ABNORMAL HIGH (ref 5–15)
Anion gap: 11 (ref 5–15)
BUN: 13 mg/dL (ref 8–23)
BUN: 14 mg/dL (ref 8–23)
CO2: 20 mmol/L — ABNORMAL LOW (ref 22–32)
CO2: 22 mmol/L (ref 22–32)
Calcium: 8.9 mg/dL (ref 8.9–10.3)
Calcium: 8.7 mg/dL — ABNORMAL LOW (ref 8.9–10.3)
Chloride: 101 mmol/L (ref 98–111)
Chloride: 103 mmol/L (ref 98–111)
Creatinine, Ser: 0.79 mg/dL (ref 0.61–1.24)
Creatinine, Ser: 0.63 mg/dL (ref 0.61–1.24)
GFR, Estimated: >60 mL/min (ref >60)
GFR, Estimated: >60 mL/min (ref >60)
Glucose, Bld: 184 mg/dL — ABNORMAL HIGH (ref 70–99)
Glucose, Bld: 214 mg/dL — ABNORMAL HIGH (ref 70–99)
Potassium: 3.5 mmol/L (ref 3.5–5.1)
Potassium: 3.5 mmol/L (ref 3.5–5.1)
Sodium: 137 mmol/L (ref 135–145)
Sodium: 136 mmol/L (ref 135–145)
Total Bilirubin: 6.2 mg/dL — ABNORMAL HIGH (ref 0.3–1.2)
Total Bilirubin: 6.1 mg/dL — ABNORMAL HIGH (ref 0.3–1.2)
Total Protein: 6.1 g/dL — ABNORMAL LOW (ref 6.5–8.1)
Total Protein: 5.5 g/dL — ABNORMAL LOW (ref 6.5–8.1)

## 2021-02-06 LAB — APTT
aPTT: 32 seconds (ref 24–36)
aPTT: 33 seconds (ref 24–36)

## 2021-02-06 LAB — CBC
HCT: 31 % — ABNORMAL LOW (ref 39.0–52.0)
Hemoglobin: 10.3 g/dL — ABNORMAL LOW (ref 13.0–17.0)
MCH: 30 pg (ref 26.0–34.0)
MCHC: 33.2 g/dL (ref 30.0–36.0)
MCV: 90.4 fL (ref 80.0–100.0)
Platelets: 229 10*3/uL (ref 150–400)
RBC: 3.43 MIL/uL — ABNORMAL LOW (ref 4.22–5.81)
RDW: 14.4 % (ref 11.5–15.5)
WBC: 19.4 10*3/uL — ABNORMAL HIGH (ref 4.0–10.5)
nRBC: 0 % (ref 0.0–0.2)

## 2021-02-06 LAB — PROTIME-INR
INR: 1.2 (ref 0.8–1.2)
INR: 1.3 — ABNORMAL HIGH (ref 0.8–1.2)
Prothrombin Time: 15.6 seconds — ABNORMAL HIGH (ref 11.4–15.2)
Prothrombin Time: 15.9 seconds — ABNORMAL HIGH (ref 11.4–15.2)

## 2021-02-06 LAB — URINALYSIS, ROUTINE W REFLEX MICROSCOPIC
Glucose, UA: NEGATIVE mg/dL
Ketones, ur: NEGATIVE mg/dL
Nitrite: NEGATIVE
Protein, ur: NEGATIVE mg/dL
Specific Gravity, Urine: 1.01 (ref 1.005–1.030)
WBC, UA: >50 WBC/hpf — ABNORMAL HIGH (ref 0–5)
pH: 6 (ref 5.0–8.0)

## 2021-02-06 LAB — GLUCOSE, CAPILLARY
Glucose-Capillary: 158 mg/dL — ABNORMAL HIGH (ref 70–99)
Glucose-Capillary: 116 mg/dL — ABNORMAL HIGH (ref 70–99)
Glucose-Capillary: 94 mg/dL (ref 70–99)
Glucose-Capillary: 233 mg/dL — ABNORMAL HIGH (ref 70–99)

## 2021-02-06 LAB — LACTIC ACID, PLASMA
Lactic Acid, Venous: 2.2 mmol/L (ref 0.5–1.9)
Lactic Acid, Venous: 1.5 mmol/L (ref 0.5–1.9)
Lactic Acid, Venous: 1.1 mmol/L (ref 0.5–1.9)
Lactic Acid, Venous: 1.3 mmol/L (ref 0.5–1.9)

## 2021-02-06 LAB — GAMMA GT: GGT: 281 U/L — ABNORMAL HIGH (ref 7–50)

## 2021-02-06 LAB — PROCALCITONIN: Procalcitonin: 4.07 ng/mL

## 2021-02-06 IMAGING — US US ABDOMEN LIMITED RUQ/ASCITES
1 series · 14 of 25 positions shown · non-contrast
Comparison: [DATE]

CLINICAL DATA: AST abnormality.

EXAM:
ULTRASOUND ABDOMEN LIMITED RIGHT UPPER QUADRANT

[Series 1: us abdomen limited ruq/ascites · 14 of 48 slices shown]
[im 1/48]
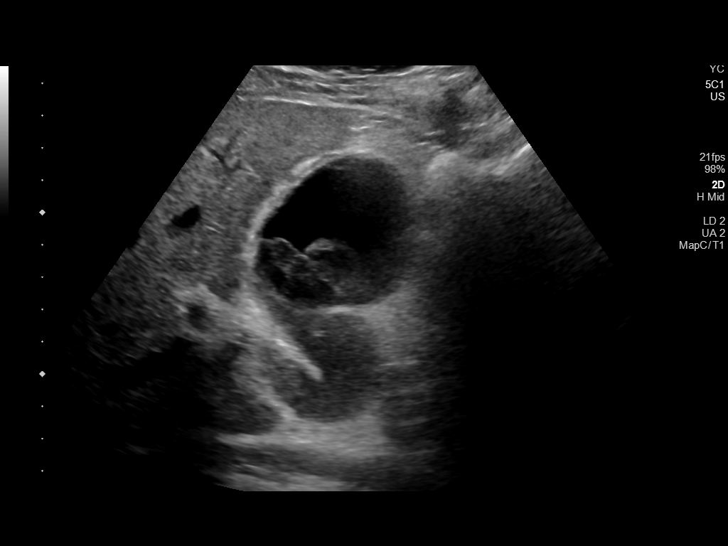
[im 4/48]
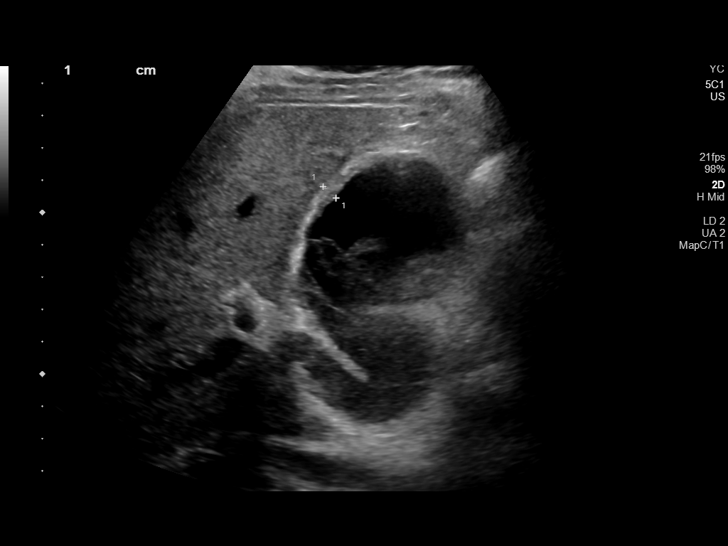
[im 8/48]
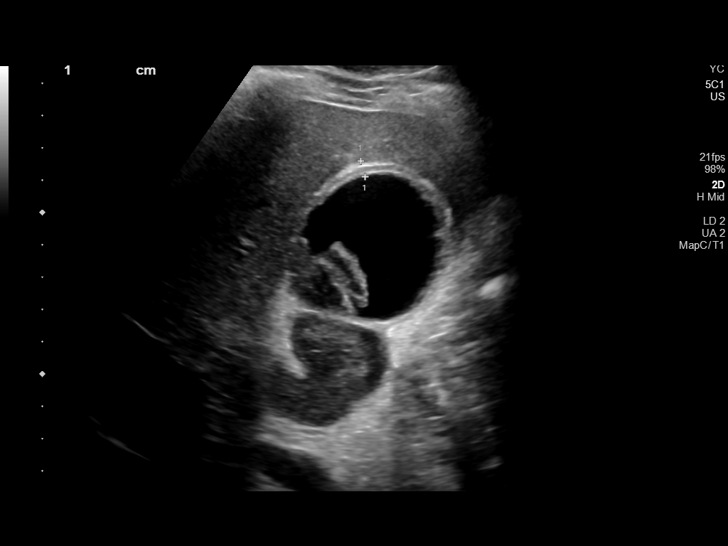
[im 12/48]
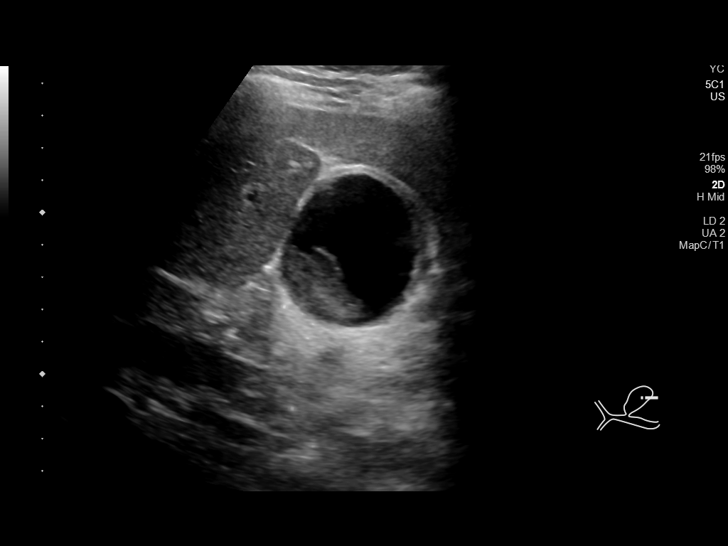
[im 16/48]
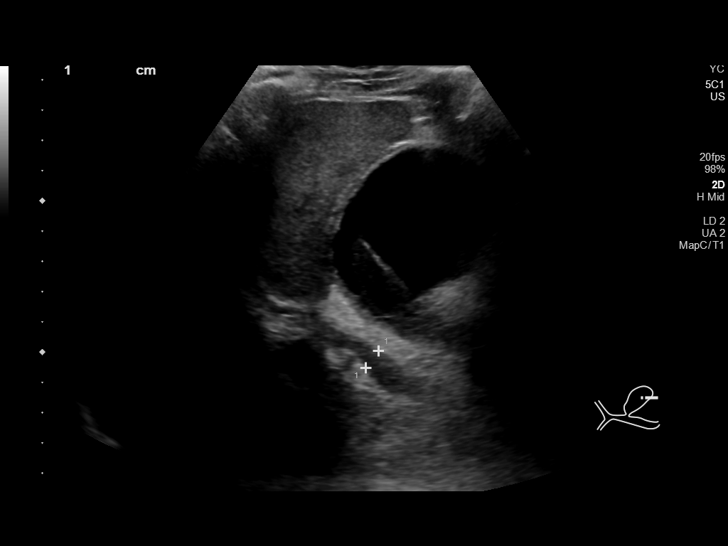
[im 18/48]
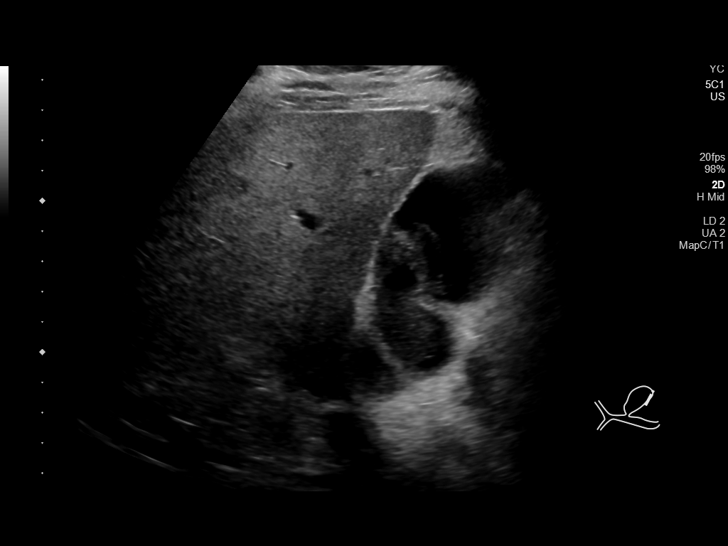
[im 22/48]
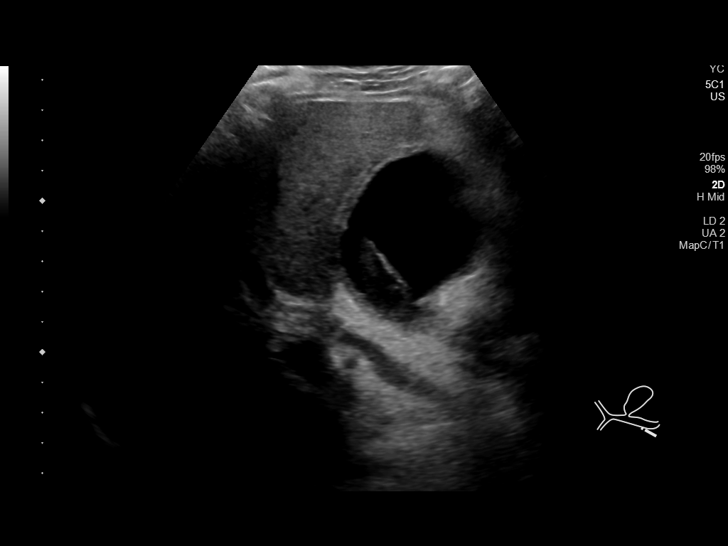
[im 26/48]
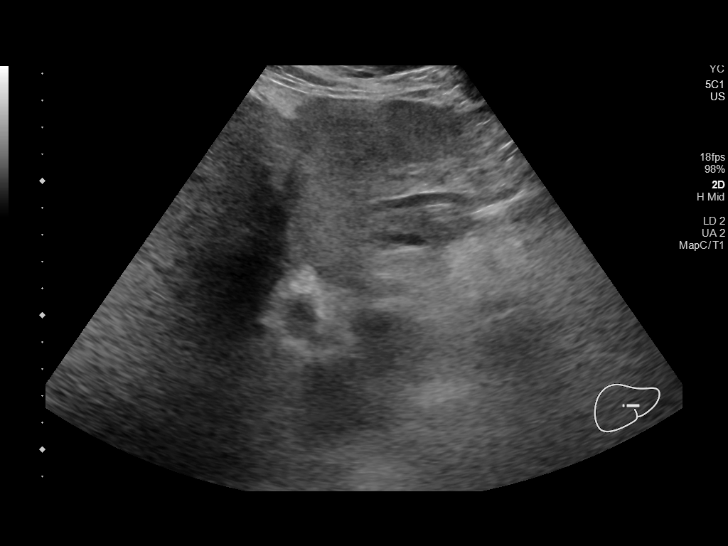
[im 30/48]
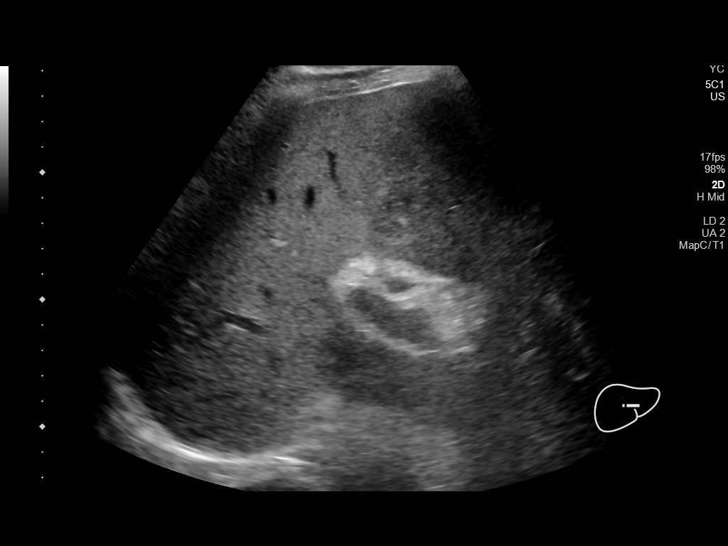
[im 32/48]
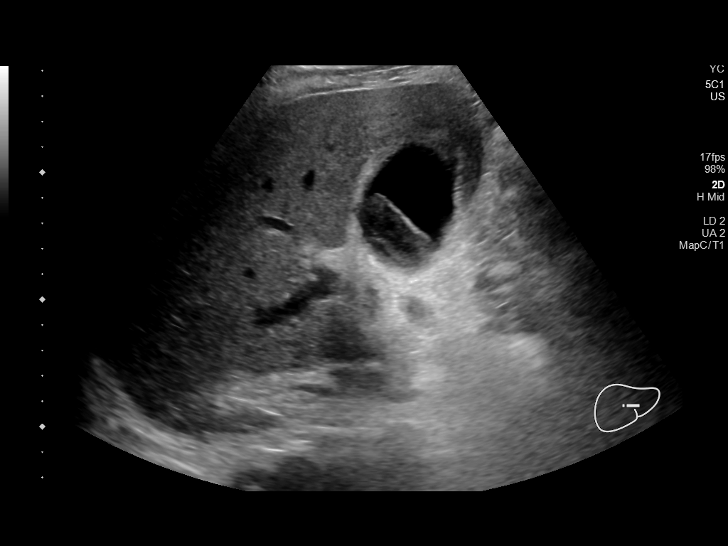
[im 36/48]
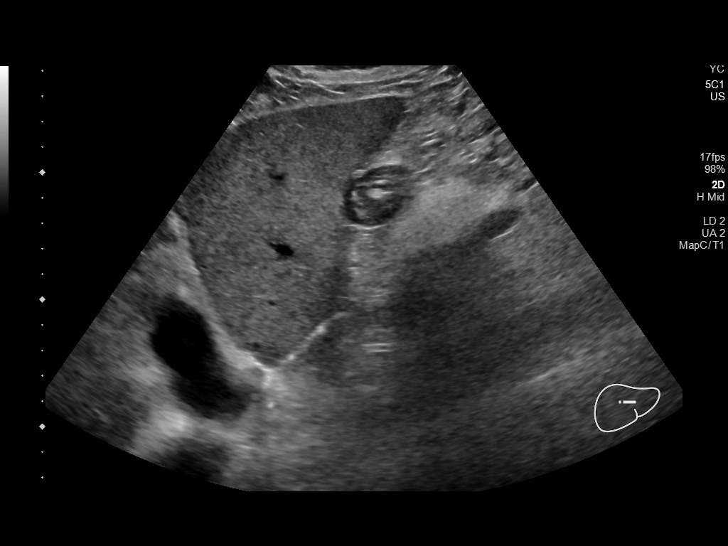
[im 40/48]
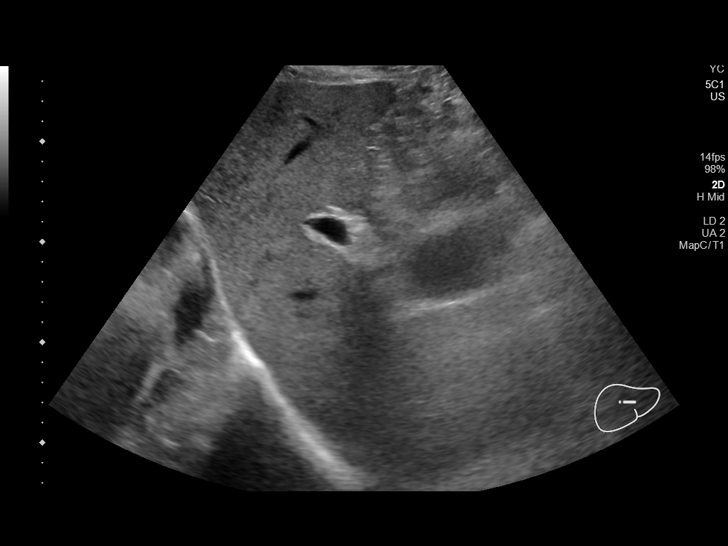
[im 44/48]
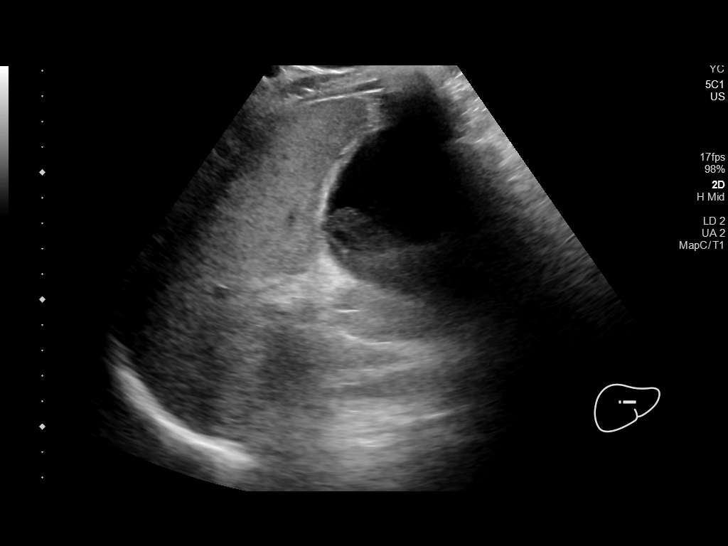
[im 48/48]
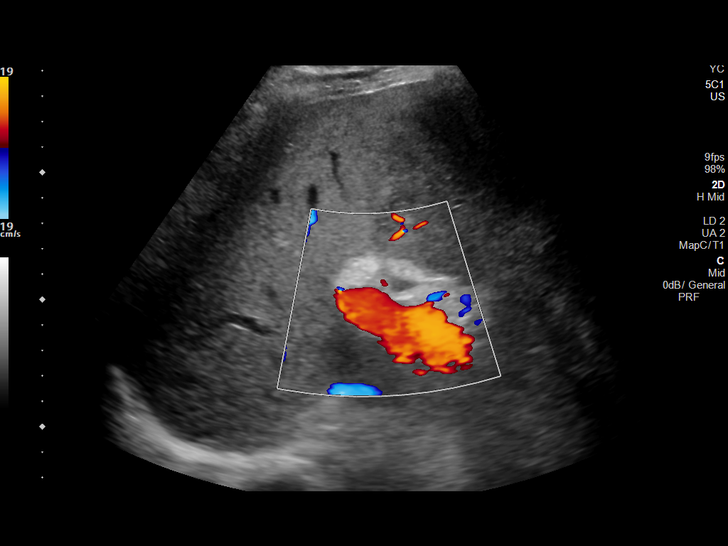

[14 of 25 positions shown; findings below may reference images not displayed]

FINDINGS: Gallbladder:

A large amount of heterogeneous sludge is seen within the
gallbladder lumen. No gallstones are identified. The gallbladder
wall measures 5.2 mm in thickness. No sonographic Murphy sign noted
by sonographer.

Common bile duct:

Diameter: 7.6 mm

Liver:

No focal lesion identified. Diffusely increased echogenicity of the
liver parenchyma is seen. Portal vein is patent on color Doppler
imaging with normal direction of blood flow towards the liver.

Other: None.
IMPRESSION: 1. Gallbladder sludge and gallbladder wall thickening without
evidence of cholelithiasis. While this may represent sequelae
associated with chronic cholecystitis, further evaluation with a
nuclear medicine hepatobiliary scan is recommended.
2. Fatty liver.

## 2021-02-06 IMAGING — MR MR MRCP
10 of 15 series · 37 of 48 positions shown · non-contrast
Comparison: CT [DATE]. Ultrasound [DATE] and [DATE].
Nuclear medicine hepatobiliary scan [DATE].

CLINICAL DATA: Jaundice. Gallbladder wall thickening and irregular
gallbladder sludge on prior ultrasound.

EXAM:
MRI ABDOMEN WITHOUT CONTRAST  (INCLUDING MRCP)
TECHNIQUE: Multiplanar multisequence MR imaging of the abdomen was performed.
Heavily T2-weighted images of the biliary and pancreatic ducts were
obtained, and three-dimensional MRCP images were rendered by post
processing.

[Series 3: T2 fat-sat · axial · 6.0mm · 1.25mm/px · z∈[-83,+169]mm · 2 of 36 slices shown]
[im 1/36]
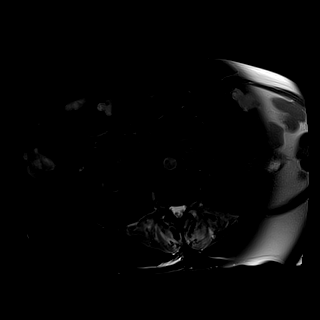
[im 36/36]
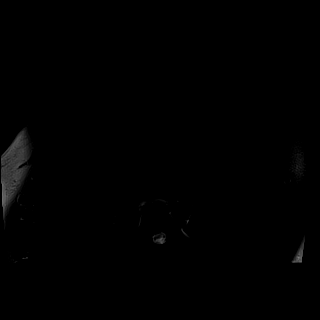

[Series 5: T2 · coronal · 6.0mm · 1.56mm/px · 2 of 32 slices shown (1 of 2)]
[im 1/32]
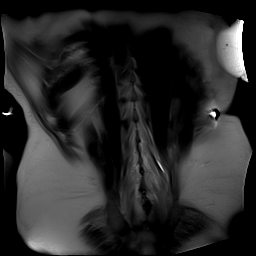
[im 32/32]
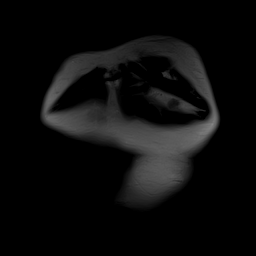

[Series 7: DWI · axial · 6.0mm · 1.49mm/px · z∈[-103,+149]mm · 6 of 72 slices shown (1 of 2)]
[im 1/72]
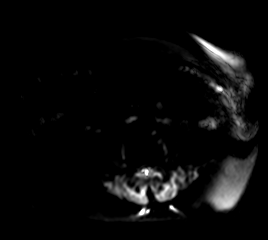
[im 15/72]
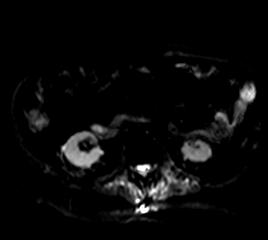
[im 29/72]
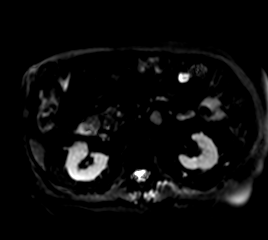
[im 43/72]
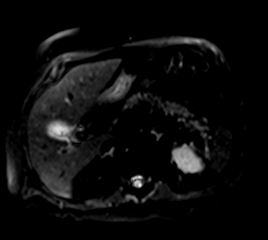
[im 57/72]
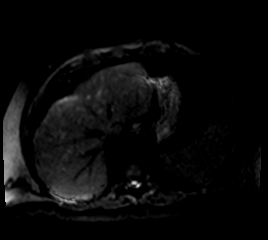
[im 72/72]
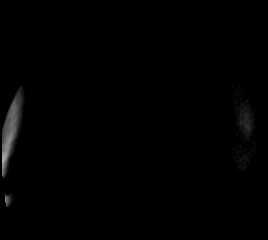

[Series 8: DWI · axial · 6.0mm · 1.49mm/px · z∈[-103,+149]mm · 3 of 36 slices shown (2 of 2)]
[im 1/36]
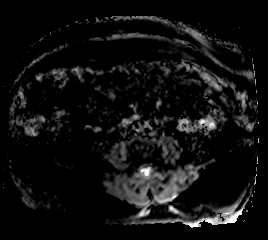
[im 18/36]
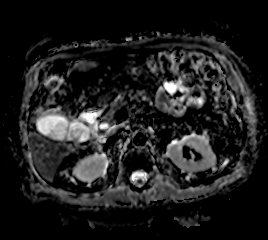
[im 36/36]
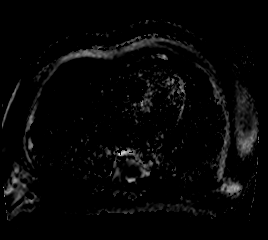

[Series 9: T1 · axial · 3.0mm · 1.25mm/px · z∈[-59,+154]mm · 6 of 72 slices shown (1 of 3)]
[im 1/72]
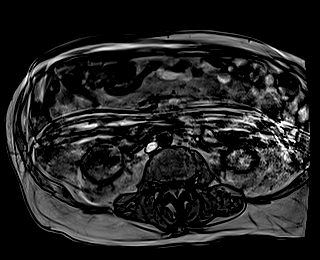
[im 15/72]
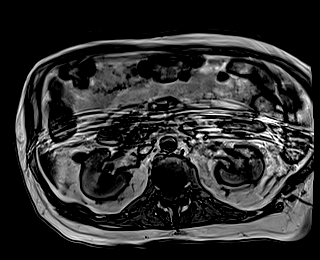
[im 29/72]
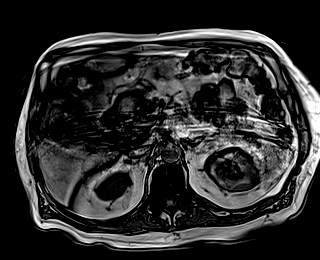
[im 43/72]
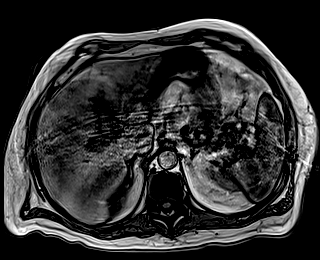
[im 57/72]
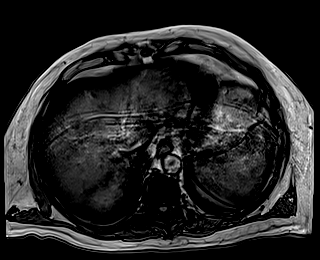
[im 72/72]
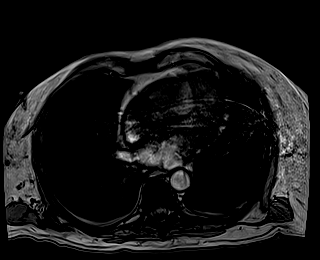

[Series 10: T1 · axial · 3.0mm · 1.25mm/px · z∈[-59,+154]mm · 6 of 72 slices shown (2 of 3)]
[im 1/72]
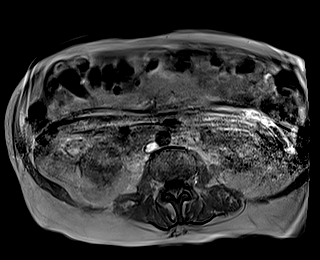
[im 15/72]
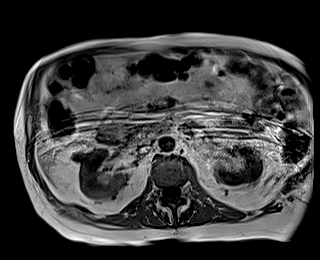
[im 29/72]
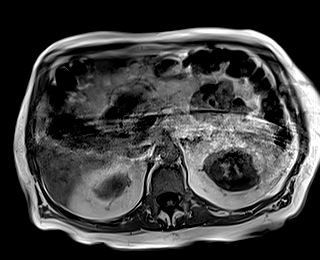
[im 43/72]
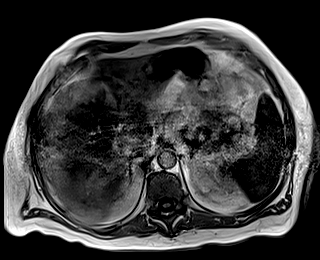
[im 57/72]
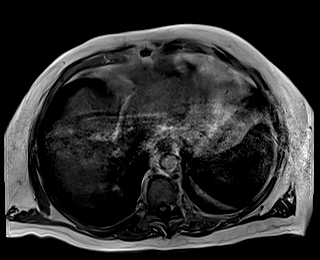
[im 72/72]
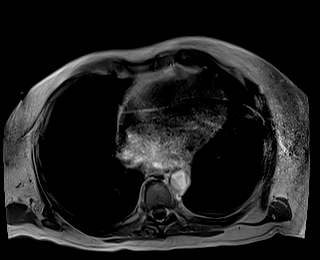

[Series 11: cor obl thk · oblique · 50.0mm · 0.78mm/px · 1 of 9 slices shown]
[im 1/9]
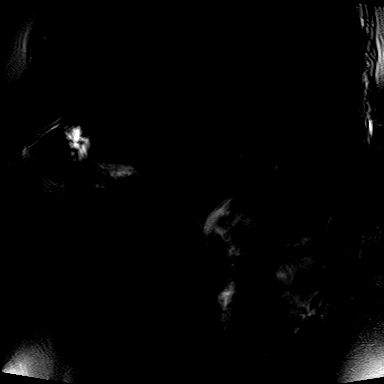

[Series 13: cor_3d_spc_trig-resp · 1 of 13 slices shown]
[im 1/13]
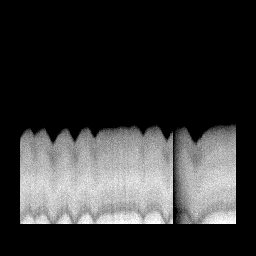

[Series 15: T2 · axial · 6.0mm · 1.56mm/px · z∈[-73,+164]mm · 3 of 34 slices shown (2 of 2)]
[im 1/34]
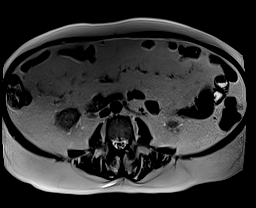
[im 17/34]
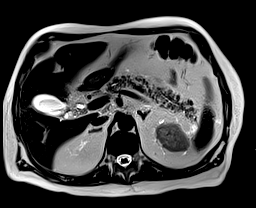
[im 34/34]
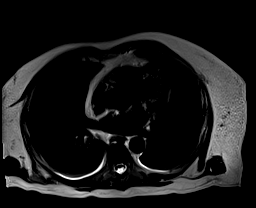

[Series 17: T1 · axial · 3.0mm · 1.25mm/px · z∈[-120,+117]mm · 7 of 80 slices shown (3 of 3)]
[im 1/80]
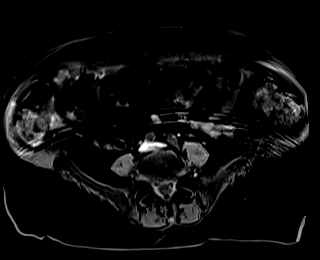
[im 14/80]
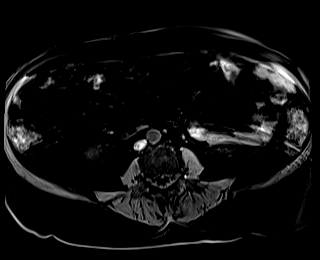
[im 27/80]
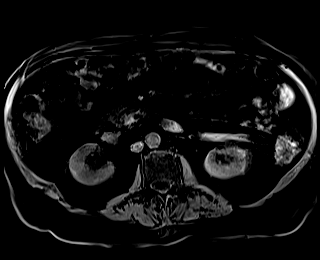
[im 40/80]
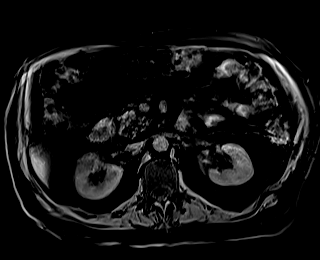
[im 53/80]
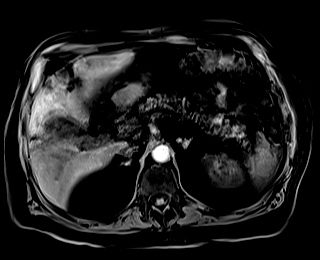
[im 66/80]
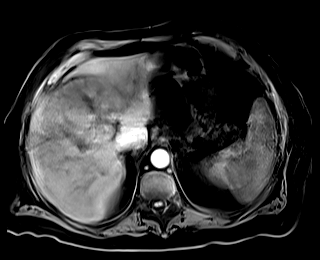
[im 80/80]
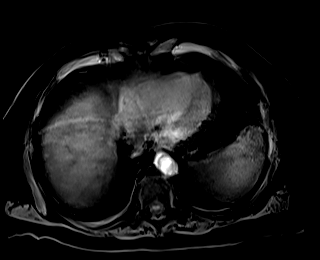

[37 of 48 positions shown; findings below may reference images not displayed]

FINDINGS: Despite efforts by the technologist and patient, mild motion
artifact is present on today's exam and could not be eliminated.
This reduces exam sensitivity and specificity.

Lower chest: Trace bilateral pleural effusions with mild atelectasis
at both lung bases.

Hepatobiliary: No focal hepatic abnormalities are identified. The
thin section MRCP images are motion degraded, but there is only mild
extrahepatic biliary dilatation with the common hepatic duct
measuring 8 mm maximally. No evidence of choledocholithiasis or
intrahepatic biliary dilatation. Mild gallbladder wall thickening
and irregular sludge within the gallbladder lumen are similar to the
recent ultrasound. No discrete gallstones identified.

Pancreas: Unremarkable. No pancreatic ductal dilatation or
surrounding inflammatory changes.

Spleen: Normal in size without focal abnormality.

Adrenals/Urinary Tract: Both adrenal glands appear normal. There is
a small cyst in the anterior interpolar region of the right kidney.
The left kidney appears normal. No hydronephrosis.

Stomach/Bowel: The stomach appears unremarkable for its degree of
distension. No evidence of bowel wall thickening, distention or
surrounding inflammatory change.Scattered colonic diverticulosis.

Vascular/Lymphatic: There are no enlarged abdominal lymph nodes.
Aortic atherosclerosis without aneurysm.

Other: Supraumbilical hernia containing only fat appears stable. No
ascites.

Musculoskeletal: No acute or significant osseous findings. Mild
edema within the erector spinae musculature.
IMPRESSION: 1. Minimal extrahepatic biliary dilatation without evidence of
choledocholithiasis, pancreatic lesion or pancreatic ductal
dilatation.
2. Persistent abnormal appearance of the gallbladder with wall
thickening and heterogeneous sludge. As correlated with previous
imaging, cholecystitis not excluded.
3. Scattered colonic diverticulosis without acute inflammation.
4. Stable supraumbilical hernia containing only fat.

## 2021-02-06 IMAGING — DX DG CHEST 1V PORT
1 series · 1 of 1 positions shown · non-contrast
Comparison: [DATE]

CLINICAL DATA: Slurred speech.

EXAM:
PORTABLE CHEST 1 VIEW

[chest ap]
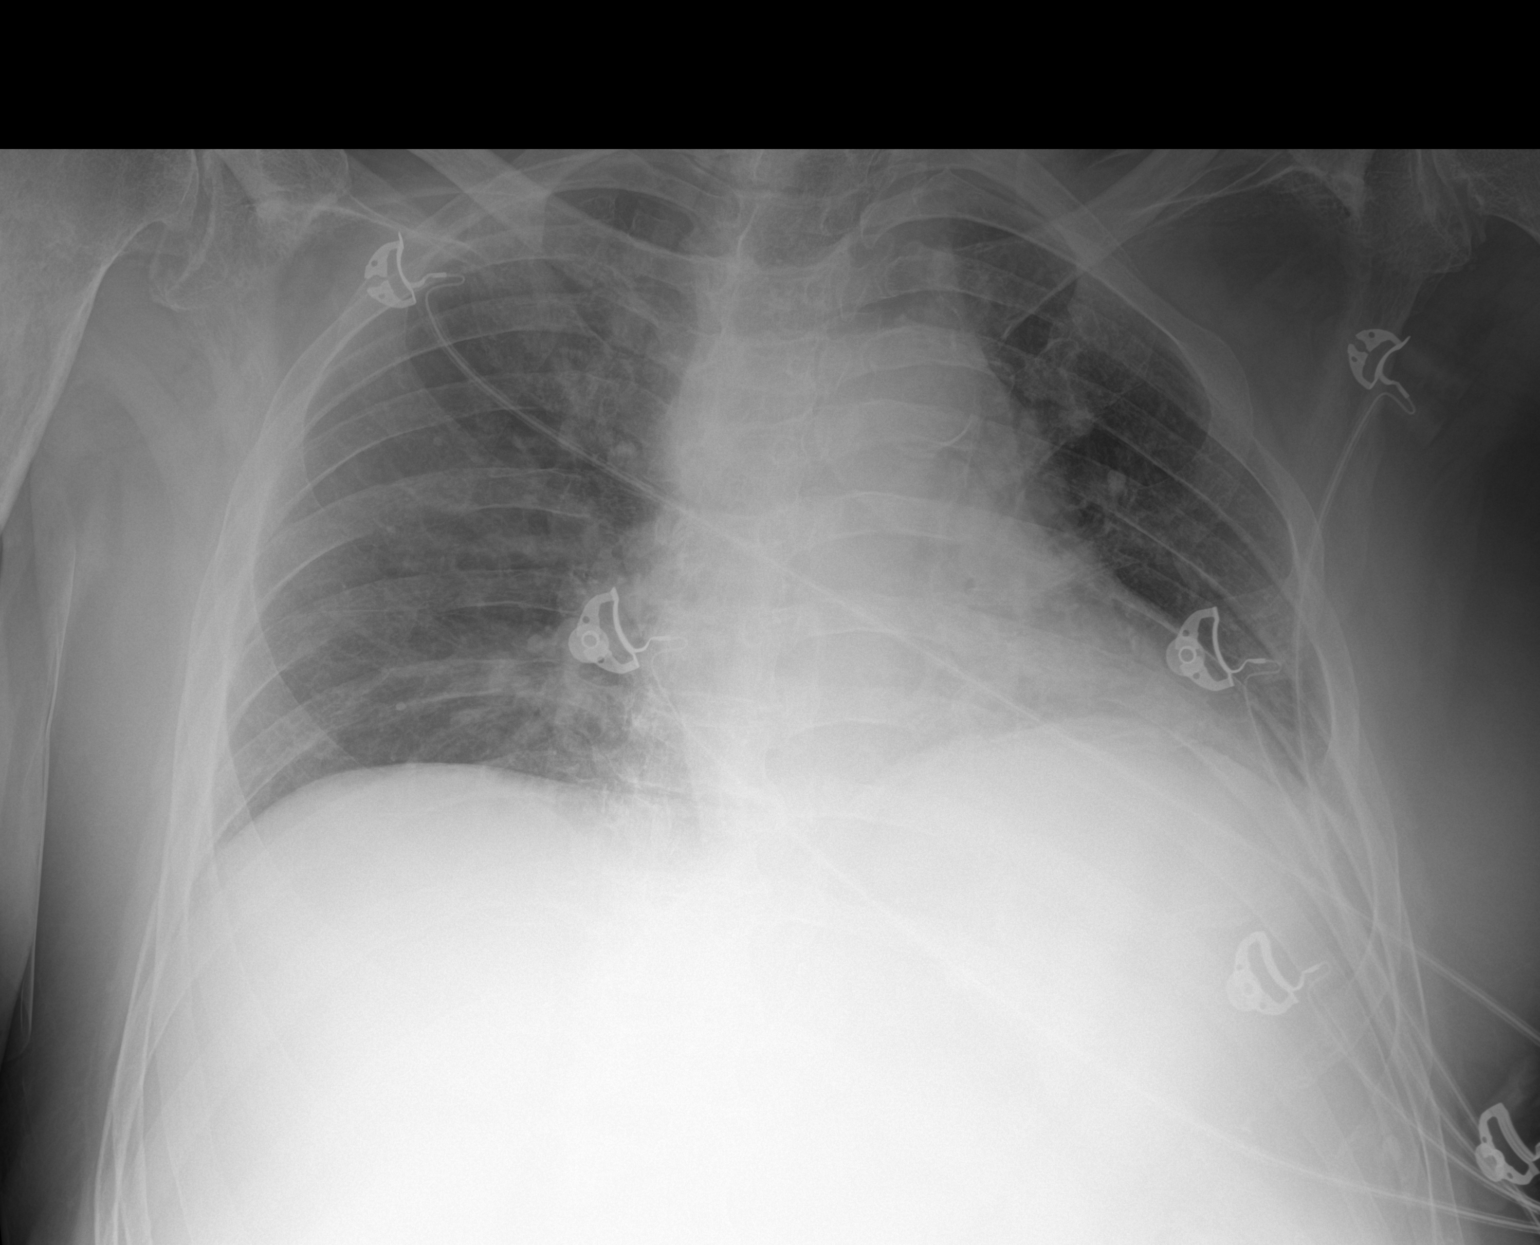

[1 of 1 positions shown; findings below may reference images not displayed]

FINDINGS: Decreased lung volumes are seen which is likely secondary to the
degree of patient inspiration. Very mild left basilar atelectasis is
noted. There is no evidence of a pleural effusion or pneumothorax.
The heart size and mediastinal contours are within normal limits.
Mild calcification of the aortic arch is noted. The visualized
skeletal structures are unremarkable.
IMPRESSION: Very mild left basilar atelectasis.

## 2021-02-06 MED ORDER — INSULIN ASPART 100 UNIT/ML IJ SOLN
0.0000 [IU] | Freq: Three times a day (TID) | INTRAMUSCULAR | Status: DC
  Filled 2021-02-06: qty 0.15

## 2021-02-06 MED ORDER — SODIUM CHLORIDE 0.9 % IV SOLN
1.0000 g | Freq: Three times a day (TID) | INTRAVENOUS | Status: DC
  Administered 2021-02-06: 1 g via INTRAVENOUS
  Filled 2021-02-06 (×3): qty 1

## 2021-02-06 MED ORDER — INSULIN ASPART 100 UNIT/ML IJ SOLN
0.0000 [IU] | INTRAMUSCULAR | Status: DC
  Administered 2021-02-06: 3 [IU] via SUBCUTANEOUS
  Administered 2021-02-07: 1 [IU] via SUBCUTANEOUS
  Administered 2021-02-07: 2 [IU] via SUBCUTANEOUS
  Administered 2021-02-07: 1 [IU] via SUBCUTANEOUS
  Administered 2021-02-08: 2 [IU] via SUBCUTANEOUS
  Administered 2021-02-08 (×3): 1 [IU] via SUBCUTANEOUS
  Administered 2021-02-08 – 2021-02-09 (×2): 2 [IU] via SUBCUTANEOUS
  Administered 2021-02-09: 1 [IU] via SUBCUTANEOUS
  Administered 2021-02-09: 2 [IU] via SUBCUTANEOUS
  Administered 2021-02-10: 1 [IU] via SUBCUTANEOUS

## 2021-02-06 MED ORDER — HEPARIN SODIUM (PORCINE) 5000 UNIT/ML IJ SOLN
5000.0000 [IU] | Freq: Three times a day (TID) | INTRAMUSCULAR | Status: DC
  Administered 2021-02-06: 5000 [IU] via SUBCUTANEOUS
  Filled 2021-02-06: qty 1

## 2021-02-06 MED ORDER — SODIUM CHLORIDE 0.9 % IV SOLN
2.0000 g | Freq: Three times a day (TID) | INTRAVENOUS | Status: DC
  Administered 2021-02-06 – 2021-02-10 (×11): 2 g via INTRAVENOUS
  Filled 2021-02-06 (×12): qty 2

## 2021-02-06 MED ORDER — VANCOMYCIN HCL 2000 MG/400ML IV SOLN
2000.0000 mg | INTRAVENOUS | Status: DC
  Administered 2021-02-07: 2000 mg via INTRAVENOUS
  Filled 2021-02-06: qty 400

## 2021-02-06 MED ORDER — SODIUM CHLORIDE 0.9 % IV BOLUS
500.0000 mL | Freq: Once | INTRAVENOUS | Status: AC
  Administered 2021-02-06: 500 mL via INTRAVENOUS

## 2021-02-06 MED ORDER — PANTOPRAZOLE SODIUM 40 MG IV SOLR
40.0000 mg | Freq: Two times a day (BID) | INTRAVENOUS | Status: DC
  Administered 2021-02-06 – 2021-02-08 (×6): 40 mg via INTRAVENOUS
  Filled 2021-02-06 (×6): qty 40

## 2021-02-06 MED ORDER — METRONIDAZOLE 500 MG/100ML IV SOLN
500.0000 mg | Freq: Three times a day (TID) | INTRAVENOUS | Status: DC
  Administered 2021-02-06 – 2021-02-10 (×13): 500 mg via INTRAVENOUS
  Filled 2021-02-06 (×12): qty 100

## 2021-02-06 MED ORDER — SODIUM CHLORIDE 0.9 % IV SOLN: INTRAVENOUS | Status: AC

## 2021-02-06 MED ORDER — ACETAMINOPHEN 650 MG RE SUPP
650.0000 mg | Freq: Once | RECTAL | Status: AC
  Administered 2021-02-06: 650 mg via RECTAL
  Filled 2021-02-06: qty 1

## 2021-02-06 MED ORDER — ENOXAPARIN SODIUM 40 MG/0.4ML IJ SOSY: 40.0000 mg | PREFILLED_SYRINGE | INTRAMUSCULAR | Status: DC

## 2021-02-06 NOTE — Progress Notes (Signed)
PROGRESS NOTE  Vincent [name] NAT:[id number] DOB: 06-Dec-1950 DOA: 02/05/2021 PCP: Ginger Organ., MD  HPI/Recap of past 24 hours: HPI from Dr Vincent [name] is a 70 y.o. male from SNF, seen in ED, with complaints of AMS. No family at bedside. HPI per chart review as pt is encephalopathic. Pt is DNR and has most form. Pt was d/c from hospital on 01/12/2021 to SNF for AMS attributed to B12 def, and polypharmacy. During his last visit, due to fever/leukocytosis, HIDA scan was done which showed possible chronic cholecystitis.  General surgery was consulted, due to no symptoms, no further intervention was recommended. Pt has past medical history of Chronic cholecystitis, CHF,CAD s/p pci to lad, DM 2. In the ED, pt was noted to be confused, dyspneic. Pt met severe sepsis criteria as he was tachycardic, tachypneic, febrile, with leucocytosis, WBC 25.  Noted significant transaminitis, ALK phos 403, AST of 219, ALT 153, T bili 6.1, lactic acid 2.2, procalcitonin 4.07.  Patient was started on antibiotics and IV fluids.  Patient admitted for further management.    Today, patient remains somewhat lethargic, altered, able to respond to questions appropriately.  Denied any abdominal pain, but was tender to palpation, denies any nausea, chest pain, shortness of breath, current fever/chills.    Assessment/Plan: Principal Problem:   Severe sepsis with septic shock (HCC) Active Problems:   Coronary atherosclerosis   Diabetes 1.5, managed as type 2 (Vincent [name])   Chronic cholecystitis   Sacral decubitus ulcer   Severe sepsis with septic shock (CODE) (HCC)   Severe sepsis likely 2/2 possible ??cholangitis/choledocholithiasis Acute on chronic cholecystitis Noted transaminitis On presentation, tachycardic, tachypneic, febrile, leucocytosis, LA 2.2 Currently afebrile, with leukocytosis Lactic acid 2.2--> 1.3 s/p IVF Procalcitonin 4.07, will trend BC x2 pending ALK phos 403, AST of 219, ALT 153, T bili  6.1, will trend Ultrasound shows gallbladder sludge, dilated CBD at 7.6 mm (previous 6.2 mm) GI consulted, plan for MRI/MRCP to rule out CBD stone General surgery consulted for possible cholecystectomy Vs cholecystostomy Continue IV vancomycin, cefepime, metronidazole Continue IV fluids Monitor closely  Possible UTI UA showed moderate hemoglobin, moderate leukocytes, many bacteria, WBC greater than 50 UC pending Continue IV antibiotics as above  Acute metabolic encephalopathy Likely 2/2 above Management as above  Sacral decubitus ulcer Bilateral heel ulcer Likely stage II-III decubitus ulcer WOC consulted  Hypertension BP soft Continue gentle IV hydration  Combined chronic systolic and diastolic HF Appears somewhat dry Last echo showed EF around 50% Due to soft BP, sepsis, hold home Lasix, Lopressor  Diabetes mellitus type 2 Last A1c 7.2 Currently n.p.o., SSI, Accu-Cheks, hypoglycemic protocol  CAD s/p PCI to LAD History of subacute incidental CVA Hyperlipidemia Currently chest pain-free Hold aspirin, Imdur, statins, Lopressor for now  ?Marijuana abuse UDS showed marijuana Advised to quit  Obesity Lifestyle modification advised Estimated body mass index is 30.37 kg/m as calculated from the following:   Height as of this encounter: _0  (1.727 m).   Weight as of this encounter: 90.6 kg.     Code Status: Full  Family Communication: None at bedside  Disposition Plan: Status is: Inpatient  Remains inpatient appropriate because:Inpatient level of care appropriate due to severity of illness   Dispo: The patient is from: SNF              Anticipated d/c is to: SNF              Patient currently is not medically stable  to d/c.   Difficult to place patient No    Consultants:  GI  General surgery  Procedures:  None  Antimicrobials:  IV vancomycin  Cefepime  Metronidazole  DVT prophylaxis: Lovenox   Objective: Vitals:   02/06/21 0400  02/06/21 0636 02/06/21 0658 02/06/21 1236  BP: (!) 105/55  (!) 101/53 96/71  Pulse: 79  78 79  Resp:   20 20  Temp:   97.6 F (36.4 C)   TempSrc:   Oral   SpO2: 95%  100% 100%  Weight:  90.6 kg    Height:  _0  (1.727 m)      Intake/Output Summary (Last 24 hours) at 02/06/2021 1347 Last data filed at 02/06/2021 0511 Gross per 24 hour  Intake 900 ml  Output --  Net 900 ml   Filed Weights   02/06/21 0636  Weight: 90.6 kg    Exam:  General: NAD, somewhat lethargic, unable to engage in meaningful conversation, chronically ill-appearing  Cardiovascular: S1, S2 present  Respiratory: CTAB  Abdomen: Soft, TTP, nondistended, bowel sounds present  Musculoskeletal: No bilateral pedal edema noted, noted bilateral heel wound with dressing  Skin:  Bilateral heel wound with dressing  Psychiatry:  Unable to assess    Data Reviewed: CBC: Recent Labs  Lab 02/05/21 0016 02/06/21 0510  WBC 25.1* 19.4*  NEUTROABS 21.4*  --   HGB 12.0* 10.3*  HCT 35.4* 31.0*  MCV 89.2 90.4  PLT 280 915   Basic Metabolic Panel: Recent Labs  Lab 02/05/21 0016 02/06/21 0510  NA 137 136  K 3.5 3.5  CL 101 103  CO2 20* 22  GLUCOSE 184* 214*  BUN 13 14  CREATININE 0.79 0.63  CALCIUM 8.9 8.7*   GFR: Estimated Creatinine Clearance: 95.3 mL/min (by C-G formula based on SCr of 0.63 mg/dL). Liver Function Tests: Recent Labs  Lab 02/05/21 0016 02/06/21 0510  AST 219* 172*  ALT 153* 132*  ALKPHOS 403* 341*  BILITOT 6.2* 6.1*  PROT 6.1* 5.5*  ALBUMIN 2.7* 2.3*   No results for input(s): LIPASE, AMYLASE in the last 168 hours. No results for input(s): AMMONIA in the last 168 hours. Coagulation Profile: Recent Labs  Lab 02/05/21 0016 02/06/21 0510  INR 1.2 1.3*   Cardiac Enzymes: No results for input(s): CKTOTAL, CKMB, CKMBINDEX, TROPONINI in the last 168 hours. BNP (last 3 results) No results for input(s): PROBNP in the last 8760 hours. HbA1C: No results for input(s): HGBA1C in  the last 72 hours. CBG: Recent Labs  Lab 02/05/21 2353 02/06/21 0853 02/06/21 1230  GLUCAP 173* 158* 116*   Lipid Profile: No results for input(s): CHOL, HDL, LDLCALC, TRIG, CHOLHDL, LDLDIRECT in the last 72 hours. Thyroid Function Tests: No results for input(s): TSH, T4TOTAL, FREET4, T3FREE, THYROIDAB in the last 72 hours. Anemia Panel: No results for input(s): VITAMINB12, FOLATE, FERRITIN, TIBC, IRON, RETICCTPCT in the last 72 hours. Urine analysis:    Component Value Date/Time   COLORURINE AMBER (A) 02/06/2021 0150   APPEARANCEUR HAZY (A) 02/06/2021 0150   LABSPEC 1.010 02/06/2021 0150   PHURINE 6.0 02/06/2021 0150   GLUCOSEU NEGATIVE 02/06/2021 0150   HGBUR MODERATE (A) 02/06/2021 0150   BILIRUBINUR MODERATE (A) 02/06/2021 0150   KETONESUR NEGATIVE 02/06/2021 0150   PROTEINUR NEGATIVE 02/06/2021 0150   UROBILINOGEN 0.2 01/06/2014 1257   NITRITE NEGATIVE 02/06/2021 0150   LEUKOCYTESUR MODERATE (A) 02/06/2021 0150   Sepsis Labs: _1 (procalcitonin:4,lacticidven:4)  ) Recent Results (from the past 240 hour(s))  Blood Culture (routine  x 2)     Status: None (Preliminary result)   Collection Time: 02/06/21 12:21 AM   Specimen: BLOOD  Result Value Ref Range Status   Specimen Description   Final    BLOOD BLOOD RIGHT HAND Performed at [facility] [address], [place], [place] [address]    Special Requests   Final    BOTTLES DRAWN AEROBIC AND ANAEROBIC Blood Culture adequate volume Performed at [facility] [address], [place], [place] [address]    Culture   Final    NO GROWTH < 12 HOURS Performed at [facility] [address] [place], [place] [address]    Report Status PENDING  Incomplete  Resp Panel by RT-PCR (Flu A&B, Covid) Nasopharyngeal Swab     Status: None   Collection Time: 02/06/21  1:50 AM   Specimen: Nasopharyngeal Swab; Nasopharyngeal(NP) swabs in vial transport medium  Result Value Ref Range  Status   SARS Coronavirus 2 by RT PCR NEGATIVE NEGATIVE Final    Comment: (NOTE) SARS-CoV-2 target nucleic acids are NOT DETECTED.  The SARS-CoV-2 RNA is generally detectable in upper respiratory specimens during the acute phase of infection. The lowest concentration of SARS-CoV-2 viral copies this assay can detect is 138 copies/mL. A negative result does not preclude SARS-Cov-2 infection and should not be used as the sole basis for treatment or other patient management decisions. A negative result may occur with  improper specimen collection/handling, submission of specimen other than nasopharyngeal swab, presence of viral mutation(s) within the areas targeted by this assay, and inadequate number of viral copies(<138 copies/mL). A negative result must be combined with clinical observations, patient history, and epidemiological information. The expected result is Negative.  Fact Sheet for Patients:  EntrepreneurPulse.com.au  Fact Sheet for Healthcare Providers:  IncredibleEmployment.be  This test is no t yet approved or cleared by the [place] FDA and  has been authorized for detection and/or diagnosis of SARS-CoV-2 by FDA under an Emergency Use Authorization (EUA). This EUA will remain  in effect (meaning this test can be used) for the duration of the COVID-19 declaration under Section 564(b)(1) of the Act, 21 U.S.C.section 360bbb-3(b)(1), unless the authorization is terminated  or revoked sooner.       Influenza A by PCR NEGATIVE NEGATIVE Final   Influenza B by PCR NEGATIVE NEGATIVE Final    Comment: (NOTE) The Xpert Xpress SARS-CoV-2/FLU/RSV plus assay is intended as an aid in the diagnosis of influenza from Nasopharyngeal swab specimens and should not be used as a sole basis for treatment. Nasal washings and aspirates are unacceptable for Xpert Xpress SARS-CoV-2/FLU/RSV testing.  Fact Sheet for  Patients: EntrepreneurPulse.com.au  Fact Sheet for Healthcare Providers: IncredibleEmployment.be  This test is not yet approved or cleared by the [place] FDA and has been authorized for detection and/or diagnosis of SARS-CoV-2 by FDA under an Emergency Use Authorization (EUA). This EUA will remain in effect (meaning this test can be used) for the duration of the COVID-19 declaration under Section 564(b)(1) of the Act, 21 U.S.C. section 360bbb-3(b)(1), unless the authorization is terminated or revoked.  Performed at [facility], [place] [address] [place], [place] [address]   Blood Culture (routine x 2)     Status: None (Preliminary result)   Collection Time: 02/06/21  3:40 AM   Specimen: BLOOD  Result Value Ref Range Status   Specimen Description   Final    BLOOD LEFT ANTECUBITAL Performed at [facility], [place] [address], [place], [place] [address]    Special Requests   Final    BOTTLES DRAWN AEROBIC AND ANAEROBIC Blood Culture adequate volume Performed at [facility] [address] [place], [place] [address]    Culture   Final    NO GROWTH < 12 HOURS Performed at [facility] [address] [place], [place] [address]    Report Status PENDING  Incomplete      Studies: US Abdomen Limited  Result Date: 02/06/2021 CLINICAL DATA:  AST abnormality. EXAM: ULTRASOUND ABDOMEN LIMITED RIGHT UPPER QUADRANT COMPARISON:  January 22, 2021 FINDINGS: Gallbladder: A large amount of heterogeneous sludge is seen within the gallbladder lumen. No gallstones are identified. The gallbladder wall measures 5.2 mm in thickness. No sonographic Murphy sign noted by sonographer. Common bile duct: Diameter: 7.6 mm Liver: No focal lesion identified. Diffusely increased echogenicity of the liver parenchyma is seen. Portal vein is patent on color Doppler imaging with normal direction of blood flow towards  the liver. Other: None. IMPRESSION: 1. Gallbladder sludge and gallbladder wall thickening without evidence of cholelithiasis. While this may represent sequelae associated with chronic cholecystitis, further evaluation with a nuclear medicine hepatobiliary scan is recommended. 2. Fatty liver. Electronically Signed   By: Virgina Norfolk M.D.   On: 02/06/2021 02:43   DG Chest Port 1 View  Result Date: 02/06/2021 CLINICAL DATA:  Slurred speech. EXAM: PORTABLE CHEST 1 VIEW COMPARISON:  January 21, 2021 FINDINGS: Decreased lung volumes are seen which is likely secondary to the degree of patient inspiration. Very mild left basilar atelectasis is noted. There is no evidence of a pleural effusion or pneumothorax. The heart size and mediastinal contours are within normal limits. Mild calcification of the aortic arch is noted. The visualized skeletal structures are unremarkable. IMPRESSION: Very mild left basilar atelectasis. Electronically Signed   By: Virgina Norfolk M.D.   On: 02/06/2021 01:14    Scheduled Meds: . heparin  5,000 Units Subcutaneous Q8H  . insulin aspart  0-15 Units Subcutaneous TID WC  . pantoprazole (PROTONIX) IV  40 mg Intravenous Q12H    Continuous Infusions: . sodium chloride 50 mL/hr at 02/06/21 0512  . ceFEPime (MAXIPIME) IV    . metronidazole 500 mg (02/06/21 1221)  . [START ON 02/07/2021] vancomycin       LOS: 0 days     Alma Friendly, MD [facility]  If 7PM-7AM, please contact night-coverage [email or web address] 02/06/2021, 1:47 PM

## 2021-02-06 NOTE — ED Notes (Signed)
Family updated as to patient's status.

## 2021-02-06 NOTE — ED Provider Notes (Signed)
[facility] DEPT Provider Note   CSN: [id number] Arrival date & time: 02/05/21  2323     History Chief Complaint  Patient presents with  . Code Sepsis  . Altered Mental Status    Vincent [name] is a 70 y.o. male.  The history is provided by the EMS personnel and medical records. The history is limited by the absence of a caregiver.  Altered Mental Status Presenting symptoms: combativeness and lethargy   Presenting symptoms comment:  Alternating between lethargy and combativeness Severity:  Severe Most recent episode:  Yesterday Episode history:  Single Duration:  1 day Timing:  Constant Progression:  Unchanged Chronicity:  New Context: nursing home resident   Associated symptoms: agitation and fever   Associated symptoms: no abdominal pain, no bladder incontinence and no vomiting   Patient with h/o HTN, CAD, DM who presents from [facility] with AMS.  On arrival in the ED patient was thrashing and yelling out.  Has a sacral decubitus ulcer.       Past Medical History:  Diagnosis Date  . Acute MI ([place])   . Allergy   . Anxiety   . Arthritis   . Coronary artery disease   . Depression   . Diabetes mellitus without complication ([place])   . Diverticulosis    mild  . Hemorrhoids   . HOH (hard of hearing)   . Hx of colonic polyps   . Hyperlipidemia   . Hypertension   . Left ventricular dysfunction    hx of    Patient Active Problem List   Diagnosis Date Noted  . Vitamin B12 deficiency 01/27/2021  . Diabetes 1.5, managed as type 2 ([place]) 01/27/2021  . Chronic systolic CHF (congestive heart failure) ([place]) 01/27/2021  . CVA (cerebral vascular accident) ([place]) 01/12/2021  . Weakness   . Precordial pain   . Right knee pain 01/07/2018  . History of total right knee replacement 01/07/2018  . SOB (shortness of breath) 08/12/2016  . Tachycardia 08/12/2016  . Low grade fever 08/12/2016  . Osteoarthritis of right knee 08/09/2016  . OSA  (obstructive sleep apnea) 11/29/2011  . Syncope 07/24/2011  . ARM NUMBNESS 06/19/2010  . EDEMA 03/13/2010  . FASTING HYPERGLYCEMIA 03/13/2010  . CORONARY ATHEROSCLEROSIS NATIVE CORONARY ARTERY 06/20/2009  . COLONIC POLYPS, ADENOMATOUS 01/31/2009  . HLD (hyperlipidemia) 01/31/2009  . Hypertension 01/31/2009  . CAD, UNSPECIFIED SITE 01/31/2009  . HEMORRHOIDS 01/31/2009  . DIVERTICULOSIS, MILD 01/31/2009    Past Surgical History:  Procedure Laterality Date  . ANGIOPLASTY     and bare metal stent placement  . ANKLE SURGERY Right 1986   i&d abscess- pt states "put a bone in it"  . CORONARY ANGIOPLASTY WITH STENT PLACEMENT    . KNEE ARTHROPLASTY Right 08/09/2016   Procedure: RIGHT TOTAL KNEE ARTHROPLASTY WITH COMPUTER NAVIGATION;  Surgeon: Rod Can, MD;  Location: [facility];  Service: Orthopedics;  Laterality: Right;  Needs RNFA  . LEFT HEART CATH AND CORONARY ANGIOGRAPHY N/A 11/21/2018   Procedure: LEFT HEART CATH AND CORONARY ANGIOGRAPHY;  Surgeon: Burnell Blanks, MD;  Location: [facility];  Service: Cardiovascular;  Laterality: N/A;       Family History  Problem Relation Age of Onset  . Coronary artery disease Other        positive family hx of  . Colon cancer Neg Hx   . Esophageal cancer Neg Hx   . Liver cancer Neg Hx   . Pancreatic cancer Neg Hx   .  Rectal cancer Neg Hx   . Stomach cancer Neg Hx     Social History   Tobacco Use  . Smoking status: Former Smoker    Types: Cigars    Quit date: 12/17/2005    Years since quitting: 15.1  . Smokeless tobacco: Former Systems developer    Quit date: 12/17/2005  Vaping Use  . Vaping Use: Never used  Substance Use Topics  . Alcohol use: No  . Drug use: No    Home Medications Prior to Admission medications   Medication Sig Start Date End Date Taking? Authorizing Provider  aspirin EC 81 MG tablet Take 1 tablet (81 mg total) by mouth daily. Patient taking differently: Take 81 mg by mouth at bedtime. 11/04/17   Lyda Jester M, PA-C  docusate sodium (COLACE) 100 MG capsule Take 2 capsules (200 mg total) by mouth 2 (two) times daily. 01/27/21   Hosie Poisson, MD  furosemide (LASIX) 40 MG tablet Take 1 tablet (40 mg total) by mouth daily. 01/27/21   Hosie Poisson, MD  isosorbide mononitrate (IMDUR) 30 MG 24 hr tablet Take 1 tablet (30 mg total) by mouth daily. 08/02/20   Burnell Blanks, MD  Maltodextrin-Xanthan Gum ([place]) POWD Use with every meals. 01/27/21   Hosie Poisson, MD  metFORMIN (GLUCOPHAGE) 1000 MG tablet Take 1,000 mg by mouth 2 (two) times daily.    [provider]  metoprolol tartrate (LOPRESSOR) 25 MG tablet Take 5 tablets (125 mg total) by mouth 2 (two) times daily. 01/27/21   Hosie Poisson, MD  Multiple Vitamin (MULTIVITAMIN WITH MINERALS) TABS tablet Take 1 tablet by mouth daily. 01/28/21   Hosie Poisson, MD  nitroGLYCERIN (NITROSTAT) 0.4 MG SL tablet Place 1 tablet (0.4 mg total) under the tongue every 5 (five) minutes as needed for chest pain. 11/04/17   Lyda Jester M, PA-C  polyethylene glycol (MIRALAX / GLYCOLAX) 17 g packet Take 17 g by mouth daily as needed. 01/27/21   Hosie Poisson, MD  QUEtiapine (SEROQUEL) 25 MG tablet Take 1 tablet (25 mg total) by mouth 2 (two) times daily. 01/27/21   Hosie Poisson, MD  senna-docusate (SENOKOT-S) 8.6-50 MG tablet Take 1 tablet by mouth at bedtime as needed for mild constipation. 01/27/21   Hosie Poisson, MD  simvastatin (ZOCOR) 40 MG tablet Take 1 tablet (40 mg total) by mouth at bedtime. 11/04/17   Lyda Jester M, PA-C  tamsulosin (FLOMAX) 0.4 MG CAPS capsule Take 1 capsule (0.4 mg total) by mouth daily. 01/28/21   Hosie Poisson, MD  thiamine 100 MG tablet Take 1 tablet (100 mg total) by mouth daily. 01/28/21   Hosie Poisson, MD  vitamin B-12 (CYANOCOBALAMIN) 1000 MCG tablet Take 1 tablet (1,000 mcg total) by mouth daily. 01/27/21   Hosie Poisson, MD    Allergies    Penicillins  Review of Systems   Review of  Systems  Unable to perform ROS: Mental status change  Constitutional: Positive for fever.  HENT: Negative for facial swelling.   Gastrointestinal: Negative for abdominal pain and vomiting.  Genitourinary: Negative for bladder incontinence.  Skin: Positive for color change and wound.  Neurological: Negative for tremors.  Psychiatric/Behavioral: Positive for agitation.    Physical Exam Updated Vital Signs BP 103/60   Pulse 88   Temp (!) 100.9 F (38.3 C) (Rectal)   Resp (!) 24   SpO2 95%   Physical Exam Vitals and nursing note reviewed.  Constitutional:      Appearance: Normal appearance. He is  not diaphoretic.  HENT:     Head: Normocephalic and atraumatic.     Right Ear: Tympanic membrane normal.     Left Ear: Tympanic membrane normal.     Nose: Nose normal.     Mouth/Throat:     Mouth: Mucous membranes are moist.     Pharynx: Oropharynx is clear.  Eyes:     Pupils: Pupils are equal, round, and reactive to light.  Cardiovascular:     Rate and Rhythm: Normal rate and regular rhythm.     Pulses: Normal pulses.     Heart sounds: Normal heart sounds.  Pulmonary:     Effort: Tachypnea present.     Breath sounds: Rhonchi present.  Abdominal:     General: Bowel sounds are normal.     Palpations: Abdomen is soft. There is no mass.     Tenderness: There is no abdominal tenderness. There is no guarding or rebound.     Hernia: No hernia is present.  Musculoskeletal:        General: Normal range of motion.     Cervical back: Normal range of motion and neck supple. No rigidity.     Right lower leg: No edema.     Left lower leg: No edema.     Comments: Stage 2-3 decubitus ulcer of the top of the gluteal cleft   Lymphadenopathy:     Cervical: No cervical adenopathy.  Skin:    General: Skin is warm and dry.     Capillary Refill: Capillary refill takes less than 2 seconds.  Neurological:     Deep Tendon Reflexes: Reflexes normal.  Psychiatric:     Comments: Agitated then  ceased      ED Results / Procedures / Treatments   Labs (all labs ordered are listed, but only abnormal results are displayed) Labs Reviewed  LACTIC ACID, PLASMA - Abnormal; Notable for the following components:      Result Value   Lactic Acid, Venous 2.2 (*)    All other components within normal limits  COMPREHENSIVE METABOLIC PANEL - Abnormal; Notable for the following components:   CO2 20 (*)    Glucose, Bld 184 (*)    Total Protein 6.1 (*)    Albumin 2.7 (*)    AST 219 (*)    ALT 153 (*)    Alkaline Phosphatase 403 (*)    Total Bilirubin 6.2 (*)    Anion gap 16 (*)    All other components within normal limits  CBC WITH DIFFERENTIAL/PLATELET - Abnormal; Notable for the following components:   WBC 25.1 (*)    RBC 3.97 (*)    Hemoglobin 12.0 (*)    HCT 35.4 (*)    All other components within normal limits  PROTIME-INR - Abnormal; Notable for the following components:   Prothrombin Time 15.6 (*)    All other components within normal limits  URINALYSIS, ROUTINE W REFLEX MICROSCOPIC - Abnormal; Notable for the following components:   Color, Urine AMBER (*)    APPearance HAZY (*)    Hgb urine dipstick MODERATE (*)    Bilirubin Urine MODERATE (*)    Leukocytes,Ua MODERATE (*)    WBC, UA >50 (*)    Bacteria, UA MANY (*)    Non Squamous Epithelial 0-5 (*)    All other components within normal limits  CBG MONITORING, ED - Abnormal; Notable for the following components:   Glucose-Capillary 173 (*)    All other components within normal limits  CULTURE, BLOOD (  ROUTINE X 2)  CULTURE, BLOOD (ROUTINE X 2)  URINE CULTURE  RESP PANEL BY RT-PCR (FLU A&B, COVID) ARPGX2  APTT  LACTIC ACID, PLASMA  GAMMA GT  RAPID URINE DRUG SCREEN, HOSP PERFORMED    EKG EKG Interpretation  Date/Time:  Monday Feb 06 2021 00:25:30 EDT Ventricular Rate:  94 PR Interval:  185 QRS Duration: 104 QT Interval:  380 QTC Calculation: 476 R Axis:   18 Text Interpretation: Sinus rhythm Low voltage,  precordial leads Borderline T wave abnormalities Confirmed by Dory Horn) on 02/06/2021 1:42:16 AM   Radiology DG Chest Port 1 View  Result Date: 02/06/2021 CLINICAL DATA:  Slurred speech. EXAM: PORTABLE CHEST 1 VIEW COMPARISON:  Vincent [name] 16, 2022 FINDINGS: Decreased lung volumes are seen which is likely secondary to the degree of patient inspiration. Very mild left basilar atelectasis is noted. There is no evidence of a pleural effusion or pneumothorax. The heart size and mediastinal contours are within normal limits. Mild calcification of the aortic arch is noted. The visualized skeletal structures are unremarkable. IMPRESSION: Very mild left basilar atelectasis. Electronically Signed   By: Virgina Norfolk M.D.   On: 02/06/2021 01:14    Procedures Procedures   Medications Ordered in ED Medications  lactated ringers infusion ( Intravenous New Bag/Given 02/06/21 0031)  metroNIDAZOLE (FLAGYL) IVPB 500 mg (has no administration in time range)  vancomycin (VANCOREADY) IVPB 2000 mg/400 mL (2,000 mg Intravenous New Bag/Given 02/06/21 0121)  sodium chloride 0.9 % bolus 500 mL (has no administration in time range)  acetaminophen (TYLENOL) suppository 650 mg (has no administration in time range)  aztreonam (AZACTAM) 2 g in sodium chloride 0.9 % 100 mL IVPB (2 g Intravenous New Bag/Given 02/06/21 0031)    ED Course  I have reviewed the triage vital signs and the nursing notes.  Pertinent labs & imaging results that were available during my care of the patient were reviewed by me and considered in my medical decision making (see chart for details).    MDM Reviewed: previous chart, nursing note and vitals Interpretation: labs, ECG, x-ray and ultrasound (elevated white count 25, NACPD by me on CXR, elevated lactate, elevated LFTs) Total time providing critical care: 75-105 minutes (sepsis antibiotics and IVF). This excludes time spent performing separately reportable procedures and  services. Consults: admitting MD  CRITICAL CARE Performed by: Sadia Belfiore K Azul Brumett-Rasch Total critical care time: 75 minutes Critical care time was exclusive of separately billable procedures and treating other patients. Critical care was necessary to treat or prevent imminent or life-threatening deterioration. Critical care was time spent personally by me on the following activities: development of treatment plan with patient and/or surrogate as well as nursing, discussions with consultants, evaluation of patient's response to treatment, examination of patient, obtaining history from patient or surrogate, ordering and performing treatments and interventions, ordering and review of laboratory studies, ordering and review of radiographic studies, pulse oximetry and re-evaluation of patient's condition.   BP improving with aliquots of IVF.  Antibiotics given immediately upon arrival.    Vincent [name] was evaluated in Emergency Department on 02/06/2021 for the symptoms described in the history of present illness. He was evaluated in the context of the global COVID-19 pandemic, which necessitated consideration that the patient might be at risk for infection with the SARS-CoV-2 virus that causes COVID-19. Institutional protocols and algorithms that pertain to the evaluation of patients at risk for COVID-19 are in a state of rapid change based on information released by regulatory bodies including  the [employer] and federal and state organizations. These policies and algorithms were followed during the patient's care in the ED.  Final Clinical Impression(s) / ED Diagnoses Final diagnoses:  Sepsis, due to unspecified organism, unspecified whether acute organ dysfunction present ([place])  Pressure injury of skin of sacral region, unspecified injury stage   Admit to medicine for SEPSIS    Maecy Podgurski, MD 02/06/21 [id number]

## 2021-02-06 NOTE — Consult Note (Signed)
[facility] Surgery Consult Note  Vincent [name] Mar 02, 1951  [id number].    Requesting MD: Horris Latino, MD Chief Complaint/Reason for Consult: Sepsis, elevated LFTs  HPI:  Vincent [name] is a 70 y/o M with a PMH CAD s/p PCI to LAD, CHFpEF, DM2, chronic incarcerated fat containing umbilical hernia, possible chronic cholecystitis, and chronic pain who presented to the hospital 02/05/21 from SNF with AMS. ED workup revealed hypotension (94/57), tachypnea (RR 33), WBC 25, lactic acid 2.2 (now normal),  Elevated LFTs (AST 219, ALT 153, Alk Phos 403, t.bili 6.2) and RUQ U/S showing gallbladder sludge, wall thickening, and 7.6 mm CBD.   Of note pt has a recent hospitalization 4/7-4/22/2022 for AMS which was attributed to B12 deficiency, incidental small right parietal stroke and polypharmacy.  He was seen by general surgery during that admission for low-grade fevers, leukocytosis, and abnormal gallbladder on CT/ultrasound. HIDA scan on 01/24/2021 showed nonvisualization of the gallbladder after 60 minutes. Test was felt to be inconclusive as it was not fully completed.  At that time patient was was not having any abdominal pain, had normal LFTs, and surgery did not recommend any intervention at the time with a working DDx of asymptomatic chronic cholecystitis vs reactive GB distention/thickening due to CHF.  Currently he denies any abdominal pain, n/v. He is awaiting MRCP to r/o CBD stone. He is currently NPO.   ROS:  Review of Systems  Unable to perform ROS: Mental status change    Family History  Problem Relation Age of Onset  . Coronary artery disease Other        positive family hx of  . Colon cancer Neg Hx   . Esophageal cancer Neg Hx   . Liver cancer Neg Hx   . Pancreatic cancer Neg Hx   . Rectal cancer Neg Hx   . Stomach cancer Neg Hx     Past Medical History:  Diagnosis Date  . Acute MI ([place])   . Allergy   . Anxiety   . Arthritis   . Coronary artery disease   . Depression   .  Diabetes mellitus without complication ([place])   . Diverticulosis    mild  . Hemorrhoids   . HOH (hard of hearing)   . Hx of colonic polyps   . Hyperlipidemia   . Hypertension   . Left ventricular dysfunction    hx of    Past Surgical History:  Procedure Laterality Date  . ANGIOPLASTY     and bare metal stent placement  . ANKLE SURGERY Right 1986   i&d abscess- pt states "put a bone in it"  . CORONARY ANGIOPLASTY WITH STENT PLACEMENT    . KNEE ARTHROPLASTY Right 08/09/2016   Procedure: RIGHT TOTAL KNEE ARTHROPLASTY WITH COMPUTER NAVIGATION;  Surgeon: Rod Can, MD;  Location: [facility];  Service: Orthopedics;  Laterality: Right;  Needs RNFA  . LEFT HEART CATH AND CORONARY ANGIOGRAPHY N/A 11/21/2018   Procedure: LEFT HEART CATH AND CORONARY ANGIOGRAPHY;  Surgeon: Burnell Blanks, MD;  Location: [facility];  Service: Cardiovascular;  Laterality: N/A;    Social History:  reports that he quit smoking about 15 years ago. His smoking use included cigars. He quit smokeless tobacco use about 15 years ago. He reports that he does not drink alcohol and does not use drugs.  Allergies:  Allergies  Allergen Reactions  . Penicillins Other (See Comments)    Disputed in 2022 Has patient had a PCN reaction causing immediate rash, facial/tongue/throat swelling,  SOB or lightheadedness with hypotension: Unknown Has patient had a PCN reaction causing severe rash involving mucus membranes or skin necrosis: Unknown Has patient had a PCN reaction that required hospitalization: Unknown Has patient had a PCN reaction occurring within the last 10 years: No If all of the above answers are "NO", then may proceed with Cephalosporin use.     Medications Prior to Admission  Medication Sig Dispense Refill  . aspirin EC 81 MG tablet Take 1 tablet (81 mg total) by mouth daily. (Patient taking differently: Take 81 mg by mouth at bedtime.) 90 tablet 3  . docusate sodium (COLACE) 100 MG capsule  Take 2 capsules (200 mg total) by mouth 2 (two) times daily. 10 capsule 0  . furosemide (LASIX) 40 MG tablet Take 1 tablet (40 mg total) by mouth daily. 180 tablet 3  . isosorbide mononitrate (IMDUR) 30 MG 24 hr tablet Take 1 tablet (30 mg total) by mouth daily. 90 tablet 3  . Maltodextrin-Xanthan Gum (RESOURCE THICKENUP CLEAR) POWD Use with every meals.    . metFORMIN (GLUCOPHAGE) 1000 MG tablet Take 1,000 mg by mouth 2 (two) times daily.    . metoprolol tartrate (LOPRESSOR) 25 MG tablet Take 5 tablets (125 mg total) by mouth 2 (two) times daily. 60 tablet 1  . Multiple Vitamin (MULTIVITAMIN WITH MINERALS) TABS tablet Take 1 tablet by mouth daily.    . nitroGLYCERIN (NITROSTAT) 0.4 MG SL tablet Place 1 tablet (0.4 mg total) under the tongue every 5 (five) minutes as needed for chest pain. 25 tablet 3  . polyethylene glycol (MIRALAX / GLYCOLAX) 17 g packet Take 17 g by mouth daily as needed. 14 each 0  . QUEtiapine (SEROQUEL) 25 MG tablet Take 1 tablet (25 mg total) by mouth 2 (two) times daily. 60 tablet 1  . senna-docusate (SENOKOT-S) 8.6-50 MG tablet Take 1 tablet by mouth at bedtime as needed for mild constipation.    . simvastatin (ZOCOR) 40 MG tablet Take 1 tablet (40 mg total) by mouth at bedtime. 90 tablet 3  . tamsulosin (FLOMAX) 0.4 MG CAPS capsule Take 1 capsule (0.4 mg total) by mouth daily. 30 capsule 1  . thiamine 100 MG tablet Take 1 tablet (100 mg total) by mouth daily. 30 tablet 1  . vitamin B-12 (CYANOCOBALAMIN) 1000 MCG tablet Take 1 tablet (1,000 mcg total) by mouth daily. 30 tablet 3    Blood pressure (!) 101/53, pulse 78, temperature 97.6 F (36.4 C), temperature source Oral, resp. rate 20, height _0  (1.727 m), weight 90.6 kg, SpO2 100 %. Physical Exam: General: pleasant, WD/WN white male who is laying in bed in NAD HEENT: head is normocephalic, atraumatic.  Sclera are noninjected.  PERRL.  Ears and nose without any masses or lesions.  Mouth is pink and moist. Dentition  poor Heart: regular, rate, and rhythm. No obvious murmurs, gallops, or rubs noted.  Palpable radial pulses bilaterally  Lungs: CTAB, no wheezes, rhonchi, or rales noted.  Respiratory effort nonlabored Abd: Soft, protuberant abdomen that is NT, +BS, no masses, or organomegaly MS: no BUE/BLE edema, calves soft and nontender Skin: warm and dry with no masses, lesions, or rashes Psych: A&Ox2 with an appropriate affect Neuro: He is alert. No focal deficit is present. Moves all extremities. Gait not assessed.   Results for orders placed or performed during the hospital encounter of 02/05/21 (from the past 48 hour(s))  Comprehensive metabolic panel     Status: Abnormal   Collection Time: 02/05/21 12:16 AM  Result Value Ref Range   Sodium 137 135 - 145 mmol/L   Potassium 3.5 3.5 - 5.1 mmol/L   Chloride 101 98 - 111 mmol/L   CO2 20 (L) 22 - 32 mmol/L   Glucose, Bld 184 (H) 70 - 99 mg/dL    Comment: Glucose reference range applies only to samples taken after fasting for at least 8 hours.   BUN 13 8 - 23 mg/dL   Creatinine, Ser 0.79 0.61 - 1.24 mg/dL   Calcium 8.9 8.9 - 10.3 mg/dL   Total Protein 6.1 (L) 6.5 - 8.1 g/dL   Albumin 2.7 (L) 3.5 - 5.0 g/dL   AST 219 (H) 15 - 41 U/L   ALT 153 (H) 0 - 44 U/L   Alkaline Phosphatase 403 (H) 38 - 126 U/L   Total Bilirubin 6.2 (H) 0.3 - 1.2 mg/dL   GFR, Estimated >60 >60 mL/min    Comment: (NOTE) Calculated using the CKD-EPI Creatinine Equation (2021)    Anion gap 16 (H) 5 - 15    Comment: Performed at [facility], [place] [address], [place], [place] [address]  CBC WITH DIFFERENTIAL     Status: Abnormal   Collection Time: 02/05/21 12:16 AM  Result Value Ref Range   WBC 25.1 (H) 4.0 - 10.5 K/uL   RBC 3.97 (L) 4.22 - 5.81 MIL/uL   Hemoglobin 12.0 (L) 13.0 - 17.0 g/dL   HCT 35.4 (L) 39.0 - 52.0 %   MCV 89.2 80.0 - 100.0 fL   MCH 30.2 26.0 - 34.0 pg   MCHC 33.9 30.0 - 36.0 g/dL   RDW 14.2 11.5 - 15.5 %   Platelets 280 150 - 400  K/uL   nRBC 0.0 0.0 - 0.2 %   Neutrophils Relative % 85 %   Neutro Abs 21.4 (H) 1.7 - 7.7 K/uL   Lymphocytes Relative 5 %   Lymphs Abs 1.3 0.7 - 4.0 K/uL   Monocytes Relative 7 %   Monocytes Absolute 1.6 (H) 0.1 - 1.0 K/uL   Eosinophils Relative 0 %   Eosinophils Absolute 0.1 0.0 - 0.5 K/uL   Basophils Relative 0 %   Basophils Absolute 0.0 0.0 - 0.1 K/uL   WBC Morphology      MODERATE LEFT SHIFT (>5% METAS AND MYELOS,OCC PRO NOTED)    Comment: TOXIC GRANULATION   Immature Granulocytes 3 %   Abs Immature Granulocytes 0.63 (H) 0.00 - 0.07 K/uL   Reactive, Benign Lymphocytes PRESENT    Polychromasia PRESENT     Comment: Performed at [facility], [place] [address] [place], [place] [address]  Protime-INR     Status: Abnormal   Collection Time: 02/05/21 12:16 AM  Result Value Ref Range   Prothrombin Time 15.6 (H) 11.4 - 15.2 seconds   INR 1.2 0.8 - 1.2    Comment: (NOTE) INR goal varies based on device and disease states. Performed at [facility], [place] [address], [place], [place] [address]   APTT     Status: None   Collection Time: 02/05/21 12:16 AM  Result Value Ref Range   aPTT 33 24 - 36 seconds    Comment: Performed at [facility], [place] [address], [place], [place] [address]  CBG monitoring, ED     Status: Abnormal   Collection Time: 02/05/21 11:53 PM  Result Value Ref Range   Glucose-Capillary 173 (H) 70 - 99 mg/dL    Comment: Glucose reference range applies only to  samples taken after fasting for at least 8 hours.  Lactic acid, plasma     Status: Abnormal   Collection Time: 02/06/21 12:16 AM  Result Value Ref Range   Lactic Acid, Venous 2.2 (HH) 0.5 - 1.9 mmol/L    Comment: CRITICAL RESULT CALLED TO, READ BACK BY AND VERIFIED WITH: TALKINGTON,J 02/06/21 _0  BY SEEL,M Performed at [facility] [address] [place], [place] [address]   Blood Culture (routine x 2)     Status: None  (Preliminary result)   Collection Time: 02/06/21 12:21 AM   Specimen: BLOOD  Result Value Ref Range   Specimen Description      BLOOD BLOOD RIGHT HAND Performed at [facility] [address] [place], [place] [address]    Special Requests      BOTTLES DRAWN AEROBIC AND ANAEROBIC Blood Culture adequate volume Performed at [facility] [address] [place], [place] [address]    Culture      NO GROWTH < 12 HOURS Performed at [facility] [address] [place], [place] [address]    Report Status PENDING   Urinalysis, Routine w reflex microscopic     Status: Abnormal   Collection Time: 02/06/21  1:50 AM  Result Value Ref Range   Color, Urine AMBER (A) YELLOW    Comment: BIOCHEMICALS MAY BE AFFECTED BY COLOR   APPearance HAZY (A) CLEAR   Specific Gravity, Urine 1.010 1.005 - 1.030   pH 6.0 5.0 - 8.0   Glucose, UA NEGATIVE NEGATIVE mg/dL   Hgb urine dipstick MODERATE (A) NEGATIVE   Bilirubin Urine MODERATE (A) NEGATIVE   Ketones, ur NEGATIVE NEGATIVE mg/dL   Protein, ur NEGATIVE NEGATIVE mg/dL   Nitrite NEGATIVE NEGATIVE   Leukocytes,Ua MODERATE (A) NEGATIVE   RBC / HPF 21-50 0 - 5 RBC/hpf   WBC, UA >50 (H) 0 - 5 WBC/hpf   Bacteria, UA MANY (A) NONE SEEN   WBC Clumps PRESENT    Non Squamous Epithelial 0-5 (A) NONE SEEN    Comment: Performed at [facility], [place] [address], [place], [place] [address]  Resp Panel by RT-PCR (Flu A&B, Covid) Nasopharyngeal Swab     Status: None   Collection Time: 02/06/21  1:50 AM   Specimen: Nasopharyngeal Swab; Nasopharyngeal(NP) swabs in vial transport medium  Result Value Ref Range   SARS Coronavirus 2 by RT PCR NEGATIVE NEGATIVE    Comment: (NOTE) SARS-CoV-2 target nucleic acids are NOT DETECTED.  The SARS-CoV-2 RNA is generally detectable in upper respiratory specimens during the acute phase of infection. The lowest concentration of SARS-CoV-2 viral copies this assay  can detect is 138 copies/mL. A negative result does not preclude SARS-Cov-2 infection and should not be used as the sole basis for treatment or other patient management decisions. A negative result may occur with  improper specimen collection/handling, submission of specimen other than nasopharyngeal swab, presence of viral mutation(s) within the areas targeted by this assay, and inadequate number of viral copies(<138 copies/mL). A negative result must be combined with clinical observations, patient history, and epidemiological information. The expected result is Negative.  Fact Sheet for Patients:  EntrepreneurPulse.com.au  Fact Sheet for Healthcare Providers:  IncredibleEmployment.be  This test is no t yet approved or cleared by the [place] FDA and  has been authorized for detection and/or diagnosis of SARS-CoV-2 by FDA under an Emergency Use Authorization (EUA). This EUA will remain  in effect (meaning this test  can be used) for the duration of the COVID-19 declaration under Section 564(b)(1) of the Act, 21 U.S.C.section 360bbb-3(b)(1), unless the authorization is terminated  or revoked sooner.       Influenza A by PCR NEGATIVE NEGATIVE   Influenza B by PCR NEGATIVE NEGATIVE    Comment: (NOTE) The Xpert Xpress SARS-CoV-2/FLU/RSV plus assay is intended as an aid in the diagnosis of influenza from Nasopharyngeal swab specimens and should not be used as a sole basis for treatment. Nasal washings and aspirates are unacceptable for Xpert Xpress SARS-CoV-2/FLU/RSV testing.  Fact Sheet for Patients: EntrepreneurPulse.com.au  Fact Sheet for Healthcare Providers: IncredibleEmployment.be  This test is not yet approved or cleared by the [place] FDA and has been authorized for detection and/or diagnosis of SARS-CoV-2 by FDA under an Emergency Use Authorization (EUA). This EUA will remain in effect  (meaning this test can be used) for the duration of the COVID-19 declaration under Section 564(b)(1) of the Act, 21 U.S.C. section 360bbb-3(b)(1), unless the authorization is terminated or revoked.  Performed at [facility], [place] [address], [place], [place] [address]   Rapid urine drug screen (hospital performed)     Status: Abnormal   Collection Time: 02/06/21  2:02 AM  Result Value Ref Range   Opiates NONE DETECTED NONE DETECTED   Cocaine NONE DETECTED NONE DETECTED   Benzodiazepines NONE DETECTED NONE DETECTED   Amphetamines NONE DETECTED NONE DETECTED   Tetrahydrocannabinol POSITIVE (A) NONE DETECTED   Barbiturates NONE DETECTED NONE DETECTED    Comment: (NOTE) DRUG SCREEN FOR MEDICAL PURPOSES ONLY.  IF CONFIRMATION IS NEEDED FOR ANY PURPOSE, NOTIFY LAB WITHIN 5 DAYS.  LOWEST DETECTABLE LIMITS FOR URINE DRUG SCREEN Drug Class                     Cutoff (ng/mL) Amphetamine and metabolites    1000 Barbiturate and metabolites    200 Benzodiazepine                 811 Tricyclics and metabolites     300 Opiates and metabolites        300 Cocaine and metabolites        300 THC                            50 Performed at [facility], [place] [address] [place] [address], [place], [place] [address]   Gamma GT     Status: Abnormal   Collection Time: 02/06/21  2:36 AM  Result Value Ref Range   GGT 281 (H) 7 - 50 U/L    Comment: Performed at [facility], [place] [address] [place], [place] [address]  Troponin I (High Sensitivity)     Status: None   Collection Time: 02/06/21  2:36 AM  Result Value Ref Range   Troponin I (High Sensitivity) 14 <18 ng/L    Comment: (NOTE) Elevated high sensitivity troponin I (hsTnI) values and significant  changes across serial measurements may suggest ACS but many other  chronic and acute conditions are known to elevate hsTnI results.  Refer to the "Links" section for chest pain algorithms and additional   guidance. Performed at [facility], [place] [address] [place], [place] [address]   Lactic acid, plasma     Status: None   Collection Time: 02/06/21  3:35 AM  Result Value Ref Range   Lactic Acid, Venous 1.5 0.5 - 1.9 mmol/L  Comment: Performed at [facility], [place] [address], [place], [place] [address]  Blood Culture (routine x 2)     Status: None (Preliminary result)   Collection Time: 02/06/21  3:40 AM   Specimen: BLOOD  Result Value Ref Range   Specimen Description      BLOOD LEFT ANTECUBITAL Performed at [facility], [place] [address] [place], [place] [address]    Special Requests      BOTTLES DRAWN AEROBIC AND ANAEROBIC Blood Culture adequate volume Performed at [facility] [address] [place], [place] [address]    Culture      NO GROWTH < 12 HOURS Performed at [facility] [address], [place], [place] [address]    Report Status PENDING   Troponin I (High Sensitivity)     Status: None   Collection Time: 02/06/21  5:10 AM  Result Value Ref Range   Troponin I (High Sensitivity) 13 <18 ng/L    Comment: (NOTE) Elevated high sensitivity troponin I (hsTnI) values and significant  changes across serial measurements may suggest ACS but many other  chronic and acute conditions are known to elevate hsTnI results.  Refer to the "Links" section for chest pain algorithms and additional  guidance. Performed at [facility], [place] [address], [place], [place] [address]   Lactic acid, plasma     Status: None   Collection Time: 02/06/21  5:10 AM  Result Value Ref Range   Lactic Acid, Venous 1.1 0.5 - 1.9 mmol/L    Comment: Performed at [facility], [place] [address] [place], [place] [address]  Protime-INR     Status: Abnormal   Collection Time: 02/06/21  5:10 AM  Result Value Ref Range   Prothrombin Time 15.9 (H) 11.4 - 15.2 seconds   INR 1.3 (H) 0.8 -  1.2    Comment: (NOTE) INR goal varies based on device and disease states. Performed at [facility], [place] [address], [place], [place] [address]   APTT     Status: None   Collection Time: 02/06/21  5:10 AM  Result Value Ref Range   aPTT 32 24 - 36 seconds    Comment: Performed at [facility], [place] [address] [place], [place] [address]  CBC     Status: Abnormal   Collection Time: 02/06/21  5:10 AM  Result Value Ref Range   WBC 19.4 (H) 4.0 - 10.5 K/uL   RBC 3.43 (L) 4.22 - 5.81 MIL/uL   Hemoglobin 10.3 (L) 13.0 - 17.0 g/dL   HCT 31.0 (L) 39.0 - 52.0 %   MCV 90.4 80.0 - 100.0 fL   MCH 30.0 26.0 - 34.0 pg   MCHC 33.2 30.0 - 36.0 g/dL   RDW 14.4 11.5 - 15.5 %   Platelets 229 150 - 400 K/uL   nRBC 0.0 0.0 - 0.2 %    Comment: Performed at [facility], [place] [address] [place],  [address]  Comprehensive metabolic panel     Status: Abnormal   Collection Time: 02/06/21  5:10 AM  Result Value Ref Range   Sodium 136 135 - 145 mmol/L   Potassium 3.5 3.5 - 5.1 mmol/L   Chloride 103 98 - 111 mmol/L   CO2 22 22 - 32 mmol/L   Glucose, Bld 214 (H) 70 - 99 mg/dL    Comment: Glucose reference range applies only to samples taken after fasting for at  least 8 hours.   BUN 14 8 - 23 mg/dL   Creatinine, Ser 0.63 0.61 - 1.24 mg/dL   Calcium 8.7 (L) 8.9 - 10.3 mg/dL   Total Protein 5.5 (L) 6.5 - 8.1 g/dL   Albumin 2.3 (L) 3.5 - 5.0 g/dL   AST 172 (H) 15 - 41 U/L   ALT 132 (H) 0 - 44 U/L   Alkaline Phosphatase 341 (H) 38 - 126 U/L   Total Bilirubin 6.1 (H) 0.3 - 1.2 mg/dL   GFR, Estimated >60 >60 mL/min    Comment: (NOTE) Calculated using the CKD-EPI Creatinine Equation (2021)    Anion gap 11 5 - 15    Comment: Performed at [facility], [place] [address], [place], [place] [address]  Procalcitonin     Status: None   Collection Time: 02/06/21  5:10 AM  Result Value Ref Range   Procalcitonin 4.07 ng/mL     Comment:        Interpretation: PCT > 2 ng/mL: Systemic infection (sepsis) is likely, unless other causes are known. (NOTE)       Sepsis PCT Algorithm           Lower Respiratory Tract                                      Infection PCT Algorithm    ----------------------------     ----------------------------         PCT < 0.25 ng/mL                PCT < 0.10 ng/mL          Strongly encourage             Strongly discourage   discontinuation of antibiotics    initiation of antibiotics    ----------------------------     -----------------------------       PCT 0.25 - 0.50 ng/mL            PCT 0.10 - 0.25 ng/mL               OR       >80% decrease in PCT            Discourage initiation of                                            antibiotics      Encourage discontinuation           of antibiotics    ----------------------------     -----------------------------         PCT >= 0.50 ng/mL              PCT 0.26 - 0.50 ng/mL               AND       <80% decrease in PCT              Encourage initiation of                                             antibiotics       Encourage continuation  of antibiotics    ----------------------------     -----------------------------        PCT >= 0.50 ng/mL                  PCT > 0.50 ng/mL               AND         increase in PCT                  Strongly encourage                                      initiation of antibiotics    Strongly encourage escalation           of antibiotics                                     -----------------------------                                           PCT <= 0.25 ng/mL                                                 OR                                        > 80% decrease in PCT                                      Discontinue / Do not initiate                                             antibiotics  Performed at [facility] [address], [place], [place] [address]    Lactic acid, plasma     Status: None   Collection Time: 02/06/21  7:45 AM  Result Value Ref Range   Lactic Acid, Venous 1.3 0.5 - 1.9 mmol/L    Comment: Performed at [facility], [place] [address] [place], [place] [address]  Troponin I (High Sensitivity)     Status: None   Collection Time: 02/06/21  7:45 AM  Result Value Ref Range   Troponin I (High Sensitivity) 11 <18 ng/L    Comment: (NOTE) Elevated high sensitivity troponin I (hsTnI) values and significant  changes across serial measurements may suggest ACS but many other  chronic and acute conditions are known to elevate hsTnI results.  Refer to the "Links" section for chest pain algorithms and additional  guidance. Performed at [facility], [place] [address] [place], [place] [address]   Glucose, capillary     Status: Abnormal   Collection Time: 02/06/21  8:53 AM  Result Value Ref Range   Glucose-Capillary 158 (H) 70 -  99 mg/dL    Comment: Glucose reference range applies only to samples taken after fasting for at least 8 hours.   US Abdomen Limited  Result Date: 02/06/2021 CLINICAL DATA:  AST abnormality. EXAM: ULTRASOUND ABDOMEN LIMITED RIGHT UPPER QUADRANT COMPARISON:  January 22, 2021 FINDINGS: Gallbladder: A large amount of heterogeneous sludge is seen within the gallbladder lumen. No gallstones are identified. The gallbladder wall measures 5.2 mm in thickness. No sonographic Murphy sign noted by sonographer. Common bile duct: Diameter: 7.6 mm Liver: No focal lesion identified. Diffusely increased echogenicity of the liver parenchyma is seen. Portal vein is patent on color Doppler imaging with normal direction of blood flow towards the liver. Other: None. IMPRESSION: 1. Gallbladder sludge and gallbladder wall thickening without evidence of cholelithiasis. While this may represent sequelae associated with chronic cholecystitis, further evaluation with a nuclear medicine hepatobiliary scan is  recommended. 2. Fatty liver. Electronically Signed   By: Virgina Norfolk M.D.   On: 02/06/2021 02:43   DG Chest Port 1 View  Result Date: 02/06/2021 CLINICAL DATA:  Slurred speech. EXAM: PORTABLE CHEST 1 VIEW COMPARISON:  January 21, 2021 FINDINGS: Decreased lung volumes are seen which is likely secondary to the degree of patient inspiration. Very mild left basilar atelectasis is noted. There is no evidence of a pleural effusion or pneumothorax. The heart size and mediastinal contours are within normal limits. Mild calcification of the aortic arch is noted. The visualized skeletal structures are unremarkable. IMPRESSION: Very mild left basilar atelectasis. Electronically Signed   By: Virgina Norfolk M.D.   On: 02/06/2021 01:14   Anti-infectives (From admission, onward)   Start     Dose/Rate Route Frequency Ordered Stop   02/07/21 0100  vancomycin (VANCOREADY) IVPB 2000 mg/400 mL        2,000 mg 200 mL/hr over 120 Minutes Intravenous Every 24 hours 02/06/21 0316     02/06/21 2000  ceFEPIme (MAXIPIME) 2 g in sodium chloride 0.9 % 100 mL IVPB        2 g 200 mL/hr over 30 Minutes Intravenous Every 8 hours 02/06/21 1202     02/06/21 1000  aztreonam (AZACTAM) 1 g in sodium chloride 0.9 % 100 mL IVPB  Status:  Discontinued        1 g 200 mL/hr over 30 Minutes Intravenous Every 8 hours 02/06/21 0248 02/06/21 1202   02/06/21 0400  metroNIDAZOLE (FLAGYL) IVPB 500 mg        500 mg 100 mL/hr over 60 Minutes Intravenous Every 8 hours 02/06/21 0248     02/06/21 0000  vancomycin (VANCOREADY) IVPB 2000 mg/400 mL        2,000 mg 200 mL/hr over 120 Minutes Intravenous  Once 02/05/21 2348 02/06/21 0511   02/05/21 2345  aztreonam (AZACTAM) 2 g in sodium chloride 0.9 % 100 mL IVPB        2 g 200 mL/hr over 30 Minutes Intravenous  Once 02/05/21 2336 02/06/21 0101   02/05/21 2345  metroNIDAZOLE (FLAGYL) IVPB 500 mg  Status:  Discontinued        500 mg 100 mL/hr over 60 Minutes Intravenous  Once 02/05/21 2336  02/06/21 0256   02/05/21 2345  vancomycin (VANCOCIN) IVPB 1000 mg/200 mL premix  Status:  Discontinued        1,000 mg 200 mL/hr over 60 Minutes Intravenous  Once 02/05/21 2336 02/05/21 2348      Assessment/Plan Chronic systolic and diastolic heart failure CAD-s/p PCI to LAD in 2007 HTN HLD DM  Obesity Chronic pain Recent hospitalization 4/7-4/22/2022 for AMS which was attributed to B12 deficiency, incidental small right parietal stroke and polypharmacy  This is a 70 y/o M with a recent history of abnormal gallbladder imaging, gallbladder sludge who re-presents to the hospital with AMS, low grade fever (100.9), elevated LFTs, leukocytosis, and RUQ U/S again showing gallbladder sludge and wall thickening. Unlike last admission he now has CBD dilation on RUQ U/S and elevated LFT's with a t.bili of 6.1. His presentation is now concerning for possible ascending cholangitis. He is currently hemodynamically stable and is not on pressors. GI has been consulted and has ordered an MRCP. Agree with broad spectrum antibiotics. CCS will follow - patient may require cholecystectomy vs cholecystostomy tube (given recent CVA noted on during admission 4/7-4/22) for suspected acute on chronic cholecystitis.    Jill Alexanders, PA-C [facility] Please see Amion for pager number during day hours 7:00am-4:30pm 02/06/2021, 11:04 AM

## 2021-02-06 NOTE — Consult Note (Signed)
WOC Nurse Consult Note: Reason for Consult: Bilateral heels with Stage 1 pressure injuries, two areas of Stage 3 pressure injury on the coccyx.  Wound type:Pressure Pressure Injury POA: Yes Measurement: Heels were noted to be erythematous on admission, on assessment, both areas are blanchable.  We will continue the silicone heel dressings as preventive measures and float the heels while in bed The coccyx has two separate Stage 3 pressure injuries: the left measures 2.5cm x 1.5cm x 0.3cm with a ruddy red wound base and the right measures 2cm x 0.6cm x 0.2cm with red, moist wound bed.  Both areas have a small amount of serous exudate.  The ulcers are surrounded by a 9cm x 9cm area of slow to blanch, but blanchable erythema (at risk). Wound bed:As noted above Drainage (amount, consistency, odor) As noted above Periwound:As noted above Dressing procedure/placement/frequency:Turning and repositioning is in place, heels will be floated and currently have silicone foam dressings in place.  The coccygeal wounds will be treated with silver hydrofiber dressing topped with silicone foam dressings.  A pressure redistribution chair cushion will be added to the POC.  [place] nursing team will not follow, but will remain available to this patient, the nursing and medical teams.  Please re-consult if needed. Thanks, Maudie Flakes, MSN, RN, [place], [facility]  Pager# [phone]

## 2021-02-06 NOTE — ED Notes (Signed)
Patient is resting comfortably. 

## 2021-02-06 NOTE — Consult Note (Signed)
Chief Complaint: Patient was seen in consultation today for percutaneous cholecystostomy Chief Complaint  Patient presents with  . Code Sepsis  . Altered Mental Status    Referring Physician(s): Blackman,D  Supervising Physician: Sandi Mariscal  Patient Status: [facility] - In-pt  History of Present Illness: Vincent [name] is a 70 y.o. 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  He was just discharged from [facility] on 4/22 with altered mental status attributable to B12 deficiency and polypharmacy.  During that time patient had HIDA scan which was consistent with cholecystitis.  He was seen by surgery during that admission but since he was asymptomatic it was felt that he did not need further intervention.  LFTs were also normal during that admission.  He presented to [facility] last night with altered mental status/septic shock/hypotension.  He was started on IV vancomycin.  Ultrasound abdomen revealed gallbladder sludge and gallbladder wall thickening without evidence of cholelithiasis and fatty liver.MRCP done today revealed minimal extrahepatic biliary dilatation without evidence of choledocholithiasis, pancreatic lesion or pancreatic ductal dilatation, persistent abnormal appearance of the gallbladder with wall thickening and heterogeneous sludge, cholecystitis not excluded, scattered colonic diverticulosis without acute inflammation and stable supraumbilical hernia containing only fat.  He is currently afebrile, troponins negative, lactic acid normal, creatinine normal, total bilirubin 6.1, other LFTs elevated, COVID-19 negative, BC 19.4, hemoglobin 10.3, platelets 229K, PT15.9, INR 1.3.  Request now  received from surgery for percutaneous cholecystostomy.   Past Medical History:  Diagnosis Date  . Acute MI ([place])   . Allergy   . Anxiety   . Arthritis   . Coronary artery disease   . Depression   . Diabetes mellitus without complication ([place])   . Diverticulosis    mild  . Hemorrhoids   . HOH (hard of hearing)   . Hx of colonic polyps   . Hyperlipidemia   . Hypertension   . Left ventricular dysfunction    hx of    Past Surgical History:  Procedure Laterality Date  . ANGIOPLASTY     and bare metal stent placement  . ANKLE SURGERY Right 1986   i&d abscess- pt states "put a bone in it"  . CORONARY ANGIOPLASTY WITH STENT PLACEMENT    . KNEE ARTHROPLASTY Right 08/09/2016   Procedure: RIGHT TOTAL KNEE ARTHROPLASTY WITH COMPUTER NAVIGATION;  Surgeon: Rod Can, MD;  Location: [facility];  Service: Orthopedics;  Laterality: Right;  Needs RNFA  . LEFT HEART CATH AND CORONARY ANGIOGRAPHY N/A 11/21/2018   Procedure: LEFT HEART CATH AND CORONARY ANGIOGRAPHY;  Surgeon: Burnell Blanks, MD;  Location: [place] [facility];  Service: Cardiovascular;  Laterality: N/A;    Allergies: Penicillins  Medications: Prior to Admission medications   Medication Sig Start Date End Date Taking? Authorizing Provider  aspirin EC 81 MG tablet Take 1 tablet (81 mg total) by mouth daily. Patient taking differently: Take 81 mg by mouth at bedtime. 11/04/17  Yes Lyda Jester M, PA-C  docusate sodium (COLACE) 100 MG capsule Take 2 capsules (200 mg total) by mouth 2 (two) times daily. 01/27/21  Yes Hosie Poisson, MD  furosemide (LASIX) 40 MG tablet Take 1 tablet (40 mg total) by mouth daily. 01/27/21  Yes Hosie Poisson, MD  isosorbide mononitrate (IMDUR) 30 MG 24 hr tablet  Take 1 tablet (30 mg total) by mouth daily. 08/02/20  Yes Burnell Blanks, MD  metFORMIN (GLUCOPHAGE) 1000 MG tablet Take 1,000 mg by mouth 2 (two) times daily.   Yes [provider]  metoprolol tartrate  (LOPRESSOR) 25 MG tablet Take 5 tablets (125 mg total) by mouth 2 (two) times daily. 01/27/21  Yes Hosie Poisson, MD  Multiple Vitamin (MULTIVITAMIN WITH MINERALS) TABS tablet Take 1 tablet by mouth daily. 01/28/21  Yes Hosie Poisson, MD  nitroGLYCERIN (NITROSTAT) 0.4 MG SL tablet Place 1 tablet (0.4 mg total) under the tongue every 5 (five) minutes as needed for chest pain. 11/04/17  Yes Simmons, Brittainy M, PA-C  polyethylene glycol (MIRALAX / GLYCOLAX) 17 g packet Take 17 g by mouth daily as needed. 01/27/21  Yes Hosie Poisson, MD  QUEtiapine (SEROQUEL) 25 MG tablet Take 1 tablet (25 mg total) by mouth 2 (two) times daily. 01/27/21  Yes Hosie Poisson, MD  senna-docusate (SENOKOT-S) 8.6-50 MG tablet Take 1 tablet by mouth at bedtime as needed for mild constipation. 01/27/21  Yes Hosie Poisson, MD  simvastatin (ZOCOR) 40 MG tablet Take 1 tablet (40 mg total) by mouth at bedtime. 11/04/17  Yes Lyda Jester M, PA-C  tamsulosin (FLOMAX) 0.4 MG CAPS capsule Take 1 capsule (0.4 mg total) by mouth daily. 01/28/21  Yes Hosie Poisson, MD  thiamine 100 MG tablet Take 1 tablet (100 mg total) by mouth daily. 01/28/21  Yes Hosie Poisson, MD  vitamin B-12 (CYANOCOBALAMIN) 1000 MCG tablet Take 1 tablet (1,000 mcg total) by mouth daily. 01/27/21  Yes Hosie Poisson, MD  Maltodextrin-Xanthan Gum ([place]) POWD Use with every meals. 01/27/21   Hosie Poisson, MD     Family History  Problem Relation Age of Onset  . Coronary artery disease Other        positive family hx of  . Colon cancer Neg Hx   . Esophageal cancer Neg Hx   . Liver cancer Neg Hx   . Pancreatic cancer Neg Hx   . Rectal cancer Neg Hx   . Stomach cancer Neg Hx     Social History   Socioeconomic History  . Marital status: Married    Spouse name: Not on file  . Number of children: Not on file  . Years of education: Not on file  . Highest education level: Not on file  Occupational History  . Occupation: full time     Employer: [employer]  Tobacco Use  . Smoking status: Former Smoker    Types: Cigars    Quit date: 12/17/2005    Years since quitting: 15.1  . Smokeless tobacco: Former Systems developer    Quit date: 12/17/2005  Vaping Use  . Vaping Use: Never used  Substance and Sexual Activity  . Alcohol use: No  . Drug use: No  . Sexual activity: Not on file  Other Topics Concern  . Not on file  Social History Narrative  . Not on file   Social Determinants of Health   Financial Resource Strain: Not on file  Food Insecurity: No Food Insecurity  . Worried About Charity fundraiser in the Last Year: Never true  . Ran Out of Food in the Last Year: Never true  Transportation Needs: No Transportation Needs  . Lack of Transportation (Medical): No  . Lack of Transportation (Non-Medical): No  Physical Activity: Not on file  Stress: Not on file  Social Connections: Not on file      Review  of Systems see above; currently denies fever, headache, chest pain, worsening dyspnea, cough, abdominal pain, nausea, vomiting or bleeding; he is hungry.  Vital Signs: BP 96/71 (BP Location: Right Arm)   Pulse 79   Temp 97.6 F (36.4 C) (Oral)   Resp 20   Ht 5\' 8"  (1.727 m)   Wt 199 lb 11.8 oz (90.6 kg)   SpO2 100%   BMI 30.37 kg/m   Physical Exam patient drowsy but arousable; keeps falling in and out of sleep; chest clear to auscultation bilaterally.  Heart with regular rate and rhythm.  Abdomen soft, positive bowel sounds, currently nontender.  No significant lower extremity edema.  Imaging: CT Head Wo Contrast  Result Date: 01/12/2021 CLINICAL DATA:  Slurred speech, weakness, confusion for a month. EXAM: CT HEAD WITHOUT CONTRAST CT CERVICAL SPINE WITHOUT CONTRAST TECHNIQUE: Multidetector CT imaging of the head and cervical spine was performed following the standard protocol without intravenous contrast. Multiplanar CT image reconstructions of the cervical spine were also generated. COMPARISON:  None.  FINDINGS: CT HEAD FINDINGS Brain: Patchy and confluent areas of decreased attenuation are noted throughout the deep and periventricular white matter of the cerebral hemispheres bilaterally, compatible with chronic microvascular ischemic disease. No evidence of large-territorial acute infarction. No parenchymal hemorrhage. No mass lesion. No extra-axial collection. No mass effect or midline shift. No hydrocephalus. Basilar cisterns are patent. Vascular: No hyperdense vessel. Atherosclerotic calcifications are present within the cavernous internal carotid arteries. Skull: No acute fracture or focal lesion. Sinuses/Orbits: Paranasal sinuses and mastoid air cells are clear. The orbits are unremarkable. Other: None. CT CERVICAL SPINE FINDINGS Alignment: Reversal of the normal cervical lordosis centered at the C6 level. Findings due to severe degenerative changes at the level. Otherwise normal alignment. Skull base and vertebrae: Multilevel degenerative changes of the spine that are most prominent at the C1-C2 and C5 through C7 levels. No acute fracture. No aggressive appearing focal osseous lesion or focal pathologic process. Soft tissues and spinal canal: No prevertebral fluid or swelling. No visible canal hematoma. Upper chest: Unremarkable. Other: None. IMPRESSION: 1. No acute intracranial abnormality. 2. No acutely displaced fracture or traumatic listhesis of the cervical spine. Electronically Signed   By: Iven Finn M.D.   On: 01/12/2021 16:37   CT Cervical Spine Wo Contrast  Result Date: 01/12/2021 CLINICAL DATA:  Slurred speech, weakness, confusion for a month. EXAM: CT HEAD WITHOUT CONTRAST CT CERVICAL SPINE WITHOUT CONTRAST TECHNIQUE: Multidetector CT imaging of the head and cervical spine was performed following the standard protocol without intravenous contrast. Multiplanar CT image reconstructions of the cervical spine were also generated. COMPARISON:  None. FINDINGS: CT HEAD FINDINGS Brain: Patchy  and confluent areas of decreased attenuation are noted throughout the deep and periventricular white matter of the cerebral hemispheres bilaterally, compatible with chronic microvascular ischemic disease. No evidence of large-territorial acute infarction. No parenchymal hemorrhage. No mass lesion. No extra-axial collection. No mass effect or midline shift. No hydrocephalus. Basilar cisterns are patent. Vascular: No hyperdense vessel. Atherosclerotic calcifications are present within the cavernous internal carotid arteries. Skull: No acute fracture or focal lesion. Sinuses/Orbits: Paranasal sinuses and mastoid air cells are clear. The orbits are unremarkable. Other: None. CT CERVICAL SPINE FINDINGS Alignment: Reversal of the normal cervical lordosis centered at the C6 level. Findings due to severe degenerative changes at the level. Otherwise normal alignment. Skull base and vertebrae: Multilevel degenerative changes of the spine that are most prominent at the C1-C2 and C5 through C7 levels. No acute fracture. No  aggressive appearing focal osseous lesion or focal pathologic process. Soft tissues and spinal canal: No prevertebral fluid or swelling. No visible canal hematoma. Upper chest: Unremarkable. Other: None. IMPRESSION: 1. No acute intracranial abnormality. 2. No acutely displaced fracture or traumatic listhesis of the cervical spine. Electronically Signed   By: Iven Finn M.D.   On: 01/12/2021 16:37   MR BRAIN WO CONTRAST  Result Date: 01/15/2021 CLINICAL DATA:  Headache, new or worsening.  Confusion. EXAM: MRI HEAD WITHOUT CONTRAST TECHNIQUE: Multiplanar, multiecho pulse sequences of the brain and surrounding structures were obtained without intravenous contrast. COMPARISON:  Head CT from 3 days ago. FINDINGS: Brain: Subcentimeter focus of weakly restricted diffusion in the parasagittal and subcortical right parietal lobe. Background of chronic small vessel ischemia with generalized ischemic gliosis in  the cerebral white matter and pons. Small remote left cerebellar infarct. No masslike finding, hydrocephalus, atrophy, or collection. Vascular: Normal flow voids. Skull and upper cervical spine: Separate cervical MRI. No focal marrow lesion. Sinuses/Orbits: Negative Other: Truncated study due to patient confusion. IMPRESSION: 1. Small subacute infarct in the right parietal white matter, likely a current manifestation of the patient's extensive chronic small vessel ischemia. 2. No reversible finding. Electronically Signed   By: Monte Fantasia M.D.   On: 01/15/2021 12:01   MR CERVICAL SPINE WO CONTRAST  Result Date: 01/15/2021 CLINICAL DATA:  Confusion.  Frequent falls. EXAM: MRI CERVICAL SPINE WITHOUT CONTRAST TECHNIQUE: Multiplanar, multisequence MR imaging of the cervical spine was performed. No intravenous contrast was administered. COMPARISON:  CT cervical spine dated January 12, 2021. FINDINGS: Despite efforts by the technologist and patient, motion artifact is present on today's exam and could not be eliminated. This reduces exam sensitivity and specificity. The examination was also ended early and the axial medic images were not obtained. Alignment: Unchanged reversal of the normal cervical lordosis and trace anterolisthesis at C5-C6. Vertebrae: No fracture, evidence of discitis, or bone lesion. Cord: Normal signal and morphology. Posterior Fossa, vertebral arteries, paraspinal tissues: Chronic microvascular ischemic changes in the pons. Otherwise negative. Disc levels: C2-C3:  Interbody and facet ankylosis.  No stenosis. C3-C4: No significant disc bulge or herniation. Mild-to-moderate bilateral facet arthropathy. Moderate right uncovertebral hypertrophy. Moderate right neuroforaminal stenosis. No spinal canal or left neuroforaminal stenosis. C4-C5: No significant disc bulge or herniation. Mild bilateral uncovertebral hypertrophy. Moderate left and mild right facet arthropathy. No stenosis. C5-C6: Severe  disc height loss without significant disc bulge or herniation. Moderate bilateral facet uncovertebral hypertrophy. Mild bilateral neuroforaminal stenosis. No spinal canal stenosis. C6-C7: Mild disc bulging. Moderate bilateral uncovertebral hypertrophy. No stenosis. C7-T1: Negative disc. Moderate right and mild left facet arthropathy. No stenosis. The visualized upper thoracic spine is unremarkable. IMPRESSION: 1. Normal cervical spinal cord. 2. Multilevel degenerative changes of the cervical spine as described above. No spinal canal stenosis at any level. 3. Moderate right neuroforaminal stenosis at C3-C4. Electronically Signed   By: Titus Dubin M.D.   On: 01/15/2021 12:26   CT CHEST ABDOMEN PELVIS W CONTRAST  Result Date: 01/22/2021 CLINICAL DATA:  Chest pain or shortness of breath. Sepsis. Effusion suspected. EXAM: CT CHEST, ABDOMEN, AND PELVIS WITH CONTRAST TECHNIQUE: Multidetector CT imaging of the chest, abdomen and pelvis was performed following the standard protocol during bolus administration of intravenous contrast. CONTRAST:  168mL OMNIPAQUE IOHEXOL 300 MG/ML  SOLN COMPARISON:  Plain film chest of 1 day prior. [place] 517 CTA chest. No prior abdominopelvic CTs. FINDINGS: CT CHEST FINDINGS Cardiovascular: Mild motion degradation throughout. Exam also degraded by  patient arm position, not raised above the head. Right subclavian artery stent or calcification. Aortic atherosclerosis. Mild cardiomegaly, without pericardial effusion. Multivessel coronary artery atherosclerosis. No central pulmonary embolism, on this non-dedicated study. Mediastinum/Nodes: No mediastinal or hilar adenopathy. Lungs/Pleura: No pleural fluid.  Mild centrilobular emphysema. Musculoskeletal: No acute osseous abnormality. CT ABDOMEN PELVIS FINDINGS Hepatobiliary: Normal liver. The gallbladder is mildly distended with suggestion of pericholecystic edema, including on 62/3 and 16/8. No calcified stone or biliary duct dilatation.  Pancreas: Normal, without mass or ductal dilatation. Spleen: Normal in size, without focal abnormality. Adrenals/Urinary Tract: Mild left adrenal thickening. Normal right adrenal gland. Interpolar right renal 1.8 cm lesion measures on the order of 19 HU, favoring a minimally complex cyst. Inter/upper pole punctate right renal collecting system calculus. No hydronephrosis. Foley catheter within the urinary bladder. Stomach/Bowel: Normal stomach, without wall thickening. Moderate stool within the rectum, less so the remainder of the colon. Normal terminal ileum and appendix. Normal small bowel. Vascular/Lymphatic: Aortic atherosclerosis. No abdominopelvic adenopathy. Reproductive: Normal prostate. Other: No significant free fluid. No free intraperitoneal air. Right paramidline fat containing ventral pelvic wall hernia. Presumably iatrogenic subcutaneous air within the anterior pelvic wall. Musculoskeletal: Right hip osteoarthritis. Degenerative disc disease at the lumbosacral junction, presuming a transitional S1 vertebral body. IMPRESSION: 1. Multifactorial degradation, including motion and patient arm position. 2. Mildly distended gallbladder with suggestion of pericholecystic edema. Correlate with right upper quadrant symptoms and consider ultrasound. 3. Right nephrolithiasis. 4. Possible constipation or even fecal impaction. 5. Aortic Atherosclerosis (ICD10-I70.0) and Emphysema (ICD10-J43.9). Coronary artery atherosclerosis. Electronically Signed   By: Abigail Miyamoto M.D.   On: 01/22/2021 15:03   MR ABDOMEN MRCP WO CONTRAST  Result Date: 02/06/2021 CLINICAL DATA:  Jaundice. Gallbladder wall thickening and irregular gallbladder sludge on prior ultrasound. EXAM: MRI ABDOMEN WITHOUT CONTRAST  (INCLUDING MRCP) TECHNIQUE: Multiplanar multisequence MR imaging of the abdomen was performed. Heavily T2-weighted images of the biliary and pancreatic ducts were obtained, and three-dimensional MRCP images were rendered by  post processing. COMPARISON:  CT 01/22/2021. Ultrasound 01/22/2021 and 02/06/2021. Nuclear medicine hepatobiliary scan 01/24/2021. FINDINGS: Despite efforts by the technologist and patient, mild motion artifact is present on today's exam and could not be eliminated. This reduces exam sensitivity and specificity. Lower chest: Trace bilateral pleural effusions with mild atelectasis at both lung bases. Hepatobiliary: No focal hepatic abnormalities are identified. The thin section MRCP images are motion degraded, but there is only mild extrahepatic biliary dilatation with the common hepatic duct measuring 8 mm maximally. No evidence of choledocholithiasis or intrahepatic biliary dilatation. Mild gallbladder wall thickening and irregular sludge within the gallbladder lumen are similar to the recent ultrasound. No discrete gallstones identified. Pancreas: Unremarkable. No pancreatic ductal dilatation or surrounding inflammatory changes. Spleen: Normal in size without focal abnormality. Adrenals/Urinary Tract: Both adrenal glands appear normal. There is a small cyst in the anterior interpolar region of the right kidney. The left kidney appears normal. No hydronephrosis. Stomach/Bowel: The stomach appears unremarkable for its degree of distension. No evidence of bowel wall thickening, distention or surrounding inflammatory change.Scattered colonic diverticulosis. Vascular/Lymphatic: There are no enlarged abdominal lymph nodes. Aortic atherosclerosis without aneurysm. Other: Supraumbilical hernia containing only fat appears stable. No ascites. Musculoskeletal: No acute or significant osseous findings. Mild edema within the erector spinae musculature. IMPRESSION: 1. Minimal extrahepatic biliary dilatation without evidence of choledocholithiasis, pancreatic lesion or pancreatic ductal dilatation. 2. Persistent abnormal appearance of the gallbladder with wall thickening and heterogeneous sludge. As correlated with previous  imaging, cholecystitis not excluded.  3. Scattered colonic diverticulosis without acute inflammation. 4. Stable supraumbilical hernia containing only fat. Electronically Signed   By: Richardean Sale M.D.   On: 02/06/2021 14:54   MR 3D Recon At Scanner  Result Date: 02/06/2021 CLINICAL DATA:  Jaundice. Gallbladder wall thickening and irregular gallbladder sludge on prior ultrasound. EXAM: MRI ABDOMEN WITHOUT CONTRAST  (INCLUDING MRCP) TECHNIQUE: Multiplanar multisequence MR imaging of the abdomen was performed. Heavily T2-weighted images of the biliary and pancreatic ducts were obtained, and three-dimensional MRCP images were rendered by post processing. COMPARISON:  CT 01/22/2021. Ultrasound 01/22/2021 and 02/06/2021. Nuclear medicine hepatobiliary scan 01/24/2021. FINDINGS: Despite efforts by the technologist and patient, mild motion artifact is present on today's exam and could not be eliminated. This reduces exam sensitivity and specificity. Lower chest: Trace bilateral pleural effusions with mild atelectasis at both lung bases. Hepatobiliary: No focal hepatic abnormalities are identified. The thin section MRCP images are motion degraded, but there is only mild extrahepatic biliary dilatation with the common hepatic duct measuring 8 mm maximally. No evidence of choledocholithiasis or intrahepatic biliary dilatation. Mild gallbladder wall thickening and irregular sludge within the gallbladder lumen are similar to the recent ultrasound. No discrete gallstones identified. Pancreas: Unremarkable. No pancreatic ductal dilatation or surrounding inflammatory changes. Spleen: Normal in size without focal abnormality. Adrenals/Urinary Tract: Both adrenal glands appear normal. There is a small cyst in the anterior interpolar region of the right kidney. The left kidney appears normal. No hydronephrosis. Stomach/Bowel: The stomach appears unremarkable for its degree of distension. No evidence of bowel wall thickening,  distention or surrounding inflammatory change.Scattered colonic diverticulosis. Vascular/Lymphatic: There are no enlarged abdominal lymph nodes. Aortic atherosclerosis without aneurysm. Other: Supraumbilical hernia containing only fat appears stable. No ascites. Musculoskeletal: No acute or significant osseous findings. Mild edema within the erector spinae musculature. IMPRESSION: 1. Minimal extrahepatic biliary dilatation without evidence of choledocholithiasis, pancreatic lesion or pancreatic ductal dilatation. 2. Persistent abnormal appearance of the gallbladder with wall thickening and heterogeneous sludge. As correlated with previous imaging, cholecystitis not excluded. 3. Scattered colonic diverticulosis without acute inflammation. 4. Stable supraumbilical hernia containing only fat. Electronically Signed   By: Richardean Sale M.D.   On: 02/06/2021 14:54   NM Hepato W/EF  Result Date: 01/24/2021 CLINICAL DATA:  Biliary colic, gallbladder sludge and wall thickening on ultrasound EXAM: NUCLEAR MEDICINE HEPATOBILIARY IMAGING TECHNIQUE: Sequential images of the abdomen were obtained out to 60 minutes following intravenous administration of radiopharmaceutical. RADIOPHARMACEUTICALS:  5.25 mCi Tc-45m  Choletec IV COMPARISON:  Right upper quadrant ultrasound dated 01/22/2021. CT abdomen/pelvis dated 01/22/2021. FINDINGS: Prompt uptake and biliary excretion of activity by the liver is seen. Biliary activity passes into small bowel, consistent with patent common bile duct. Gallbladder not visualized at 60 minutes. Patient refused additional imaging. As such, patency of the cystic duct cannot be confirmed, and cholecystitis is not excluded. When correlating with prior studies, gallbladder was mildly distended with wall thickening on CT. No gallstones were evident on ultrasound. Overall clinical picture favors chronic cholecystitis over acute cholecystitis. IMPRESSION: Nonvisualization of the gallbladder, raising  concern for cholecystitis, possibly chronic. These results will be called to the ordering clinician or representative by the Radiologist Assistant, and communication documented in the PACS or [employer]. Electronically Signed   By: Julian Hy M.D.   On: 01/24/2021 12:33   US Abdomen Limited  Result Date: 02/06/2021 CLINICAL DATA:  AST abnormality. EXAM: ULTRASOUND ABDOMEN LIMITED RIGHT UPPER QUADRANT COMPARISON:  January 22, 2021 FINDINGS: Gallbladder: A large amount of heterogeneous  sludge is seen within the gallbladder lumen. No gallstones are identified. The gallbladder wall measures 5.2 mm in thickness. No sonographic Murphy sign noted by sonographer. Common bile duct: Diameter: 7.6 mm Liver: No focal lesion identified. Diffusely increased echogenicity of the liver parenchyma is seen. Portal vein is patent on color Doppler imaging with normal direction of blood flow towards the liver. Other: None. IMPRESSION: 1. Gallbladder sludge and gallbladder wall thickening without evidence of cholelithiasis. While this may represent sequelae associated with chronic cholecystitis, further evaluation with a nuclear medicine hepatobiliary scan is recommended. 2. Fatty liver. Electronically Signed   By: Virgina Norfolk M.D.   On: 02/06/2021 02:43   DG Chest Port 1 View  Result Date: 02/06/2021 CLINICAL DATA:  Slurred speech. EXAM: PORTABLE CHEST 1 VIEW COMPARISON:  January 21, 2021 FINDINGS: Decreased lung volumes are seen which is likely secondary to the degree of patient inspiration. Very mild left basilar atelectasis is noted. There is no evidence of a pleural effusion or pneumothorax. The heart size and mediastinal contours are within normal limits. Mild calcification of the aortic arch is noted. The visualized skeletal structures are unremarkable. IMPRESSION: Very mild left basilar atelectasis. Electronically Signed   By: Virgina Norfolk M.D.   On: 02/06/2021 01:14   DG Chest Port 1 View  Result  Date: 01/21/2021 CLINICAL DATA:  Shortness of breath. EXAM: PORTABLE CHEST 1 VIEW COMPARISON:  January 20, 2021 FINDINGS: The heart size and mediastinal contours are within normal limits. Mild linear atelectasis is identified in the left lung base. There is no focal pneumonia, pulmonary edema or pleural effusion. The visualized skeletal structures are stable. IMPRESSION: Mild linear atelectasis in the left lung base.  No focal pneumonia. Electronically Signed   By: Abelardo Diesel M.D.   On: 01/21/2021 08:44   DG Chest Port 1 View  Result Date: 01/20/2021 CLINICAL DATA:  Dyspnea, chest pain EXAM: PORTABLE CHEST 1 VIEW COMPARISON:  01/17/2021 FINDINGS: Lungs volumes are small, but are symmetric and are clear. No pneumothorax or pleural effusion. Cardiac size within normal limits. Pulmonary vascularity is normal. Osseous structures are age-appropriate. No acute bone abnormality. IMPRESSION: No active disease. Electronically Signed   By: Fidela Salisbury MD   On: 01/20/2021 08:09   DG Chest Port 1 View  Result Date: 01/17/2021 CLINICAL DATA:  Short of breath and nausea EXAM: PORTABLE CHEST 1 VIEW COMPARISON:  01/17/2021 FINDINGS: Single frontal view of the chest demonstrates an unremarkable cardiac silhouette. Lung volumes are diminished with crowding of the central vasculature. There is developing consolidation within the medial right lung base which could reflect airspace disease or atelectasis. No effusion or pneumothorax. IMPRESSION: 1. Low lung volumes, with developing consolidation medial right lung base favor atelectasis. 2. Continued central vascular congestion. Electronically Signed   By: Randa Ngo M.D.   On: 01/17/2021 17:50   DG Chest Port 1 View  Result Date: 01/17/2021 CLINICAL DATA:  Shortness of breath. EXAM: PORTABLE CHEST 1 VIEW COMPARISON:  05/12/2016 FINDINGS: Single view of the chest demonstrates low lung volumes. Heart size is normal but there is fullness of the central vascular  structures. No discrete airspace disease or lung consolidation. Negative for a pneumothorax. IMPRESSION: 1. Low lung volumes without focal lung disease. 2. Slight prominence of the central vascular structures including the azygos shadow. These findings may be accentuated by the low lung volumes. Electronically Signed   By: Markus Daft M.D.   On: 01/17/2021 08:15   DG Knee Complete 4 Views Right  Result Date: 01/12/2021 CLINICAL DATA:  Atraumatic right knee pain. EXAM: RIGHT KNEE - COMPLETE 4+ VIEW COMPARISON:  January 07, 2018 FINDINGS: A right knee replacement is seen without evidence of surrounding lucency to suggest the presence of hardware loosening or infection. No evidence of an acute fracture or dislocation. A small joint effusion is seen. IMPRESSION: 1. Right knee replacement without evidence of hardware loosening or infection. 2. Small joint effusion. Electronically Signed   By: Virgina Norfolk M.D.   On: 01/12/2021 19:39   DG Abd Portable 1V  Result Date: 01/17/2021 CLINICAL DATA:  Short of breath, nausea EXAM: PORTABLE ABDOMEN - 1 VIEW COMPARISON:  01/06/2014 FINDINGS: Supine frontal view of the abdomen and pelvis was performed, excluding the left flank and upper abdomen by collimation. Bowel gas pattern is unremarkable without obstruction or ileus. No masses or abnormal calcifications. Moderate right hip osteoarthritis with joint space narrowing, bony remodeling of the femoral head, and marked marginal osteophyte formation. This has progressed significantly since prior study. Stable mild left hip osteoarthritis. IMPRESSION: 1. Unremarkable bowel gas pattern. 2. Moderate to severe progressive right hip osteoarthritis. Electronically Signed   By: Randa Ngo M.D.   On: 01/17/2021 17:49   EEG adult  Result Date: 01/23/2021 Lora Havens, MD     01/23/2021 10:18 AM Patient Name: ROLLIE CILLO MRN: [id number] Epilepsy Attending: Lora Havens Referring Physician/Provider: Dr Lala Lund  Date: 01/23/2021 Duration: 22.30 mins Patient history: 70 year old male with altered mental status.  EEG to evaluate for seizures. Level of alertness: lethargic, asleep AEDs during EEG study: None Technical aspects: This EEG study was done with scalp electrodes positioned according to the 10-20 International system of electrode placement. Electrical activity was acquired at a sampling rate of 500Hz  and reviewed with a high frequency filter of 70Hz  and a low frequency filter of 1Hz . EEG data were recorded continuously and digitally stored. Description: No clear posterior dominant rhythm was seen. Sleep was characterized by vertex waves, sleep spindles (12 to 14 Hz), maximal frontocentral region. EEG showed continuous generalized 5 to 7 Hz theta as well as intermittent 2-3Hz  delta slowing. Physiologic photic driving was not seen during photic stimulation. Hyperventilation was not performed.   ABNORMALITY - Continuous slow, generalized IMPRESSION: This study is suggestive of moderate diffuse encephalopathy, nonspecific etiology. No seizures or epileptiform discharges were seen throughout the recording. Lora Havens   ECHOCARDIOGRAM COMPLETE  Result Date: 01/13/2021    ECHOCARDIOGRAM REPORT   Patient Name:   NOLLAN GRAIG Date of Exam: 01/13/2021 Medical Rec #:  [id number]      Height:       68.0 in Accession #:    [id number]     Weight:       225.0 lb Date of Birth:  [birth year]     BSA:          2.149 m Patient Age:    23 years       BP:           126/72 mmHg Patient Gender: M              HR:           96 bpm. Exam Location:  Inpatient Procedure: 2D Echo, Intracardiac Opacification Agent, Cardiac Doppler and Color            Doppler Indications:    TIA  History:        Patient has no prior history of Echocardiogram examinations. CAD  and Acute MI; Risk Factors:Hypertension, Dyslipidemia and                 Diabetes.  Sonographer:    Neomia DearAMARA CROWN RDCS Referring Phys: [id number] Emeline GeneralPING T ZHANG   Sonographer Comments: Suboptimal apical window. IMPRESSIONS  1. Left ventricular ejection fraction, by estimation, is 45 to 50%. The left ventricle has mildly decreased function. The left ventricle demonstrates regional wall motion abnormalities (see scoring diagram/findings for description). Apical akinesis. Left ventricular diastolic parameters are consistent with Grade I diastolic dysfunction (impaired relaxation).  2. Swirling of contrast at apex consistent with low flow but no thrombus seen  3. Right ventricular systolic function is normal. The right ventricular size is normal. Tricuspid regurgitation signal is inadequate for assessing PA pressure.  4. The mitral valve is normal in structure. No evidence of mitral valve regurgitation.  5. The aortic valve is tricuspid. Aortic valve regurgitation is trivial. Mild to moderate aortic valve sclerosis/calcification is present, without any evidence of aortic stenosis.  6. The inferior vena cava is normal in size with greater than 50% respiratory variability, suggesting right atrial pressure of 3 mmHg. FINDINGS  Left Ventricle: Left ventricular ejection fraction, by estimation, is 45 to 50%. The left ventricle has mildly decreased function. The left ventricle demonstrates regional wall motion abnormalities. Definity contrast agent was given IV to delineate the left ventricular endocardial borders. The left ventricular internal cavity size was normal in size. There is no left ventricular hypertrophy. Left ventricular diastolic parameters are consistent with Grade I diastolic dysfunction (impaired relaxation).  LV Wall Scoring: The entire apex is akinetic. The anterior wall, antero-lateral wall, anterior septum, inferior wall, posterior wall, mid inferoseptal segment, and basal inferoseptal segment are normal. Right Ventricle: The right ventricular size is normal. No increase in right ventricular wall thickness. Right ventricular systolic function is normal. Tricuspid  regurgitation signal is inadequate for assessing PA pressure. Left Atrium: Left atrial size was normal in size. Right Atrium: Right atrial size was normal in size. Pericardium: There is no evidence of pericardial effusion. Mitral Valve: The mitral valve is normal in structure. No evidence of mitral valve regurgitation. Tricuspid Valve: The tricuspid valve is normal in structure. Tricuspid valve regurgitation is trivial. Aortic Valve: The aortic valve is tricuspid. Aortic valve regurgitation is trivial. Aortic regurgitation PHT measures 446 msec. Mild to moderate aortic valve sclerosis/calcification is present, without any evidence of aortic stenosis. Aortic valve mean gradient measures 4.0 mmHg. Aortic valve peak gradient measures 7.4 mmHg. Aortic valve area, by VTI measures 4.43 cm. Pulmonic Valve: The pulmonic valve was not well visualized. Pulmonic valve regurgitation is not visualized. Aorta: The aortic root and ascending aorta are structurally normal, with no evidence of dilitation. Venous: The inferior vena cava is normal in size with greater than 50% respiratory variability, suggesting right atrial pressure of 3 mmHg. IAS/Shunts: The interatrial septum was not well visualized.  LEFT VENTRICLE PLAX 2D LVIDd:         5.10 cm  Diastology LVIDs:         4.00 cm  LV e' medial:    4.79 cm/s LV PW:         0.70 cm  LV E/e' medial:  14.5 LV IVS:        0.70 cm  LV e' lateral:   9.14 cm/s LVOT diam:     2.70 cm  LV E/e' lateral: 7.6 LV SV:         110 LV SV Index:   51  LVOT Area:     5.73 cm  RIGHT VENTRICLE RV S prime:     9.79 cm/s  PULMONARY VEINS TAPSE (M-mode): 2.1 cm     A Reversal Duration: 100.00 msec                            A Reversal Velocity: 29.70 cm/s                            Diastolic Velocity:  0000000 cm/s                            S/D Velocity:        1.40                            Systolic Velocity:   [id number] cm/s LEFT ATRIUM             Index LA diam:        2.80 cm 1.30 cm/m LA Vol (A2C):    36.8 ml 17.13 ml/m LA Vol (A4C):   17.1 ml 7.96 ml/m LA Biplane Vol: 26.0 ml 12.10 ml/m  AORTIC VALVE                   PULMONIC VALVE AV Area (Vmax):    4.16 cm    PV Vmax:       0.76 m/s AV Area (Vmean):   4.51 cm    PV Vmean:      55.300 cm/s AV Area (VTI):     4.43 cm    PV VTI:        0.140 m AV Vmax:           136.00 cm/s PV Peak grad:  2.3 mmHg AV Vmean:          86.400 cm/s PV Mean grad:  1.0 mmHg AV VTI:            0.248 m AV Peak Grad:      7.4 mmHg AV Mean Grad:      4.0 mmHg LVOT Vmax:         98.80 cm/s LVOT Vmean:        68.000 cm/s LVOT VTI:          0.192 m LVOT/AV VTI ratio: 0.77 AI PHT:            446 msec  AORTA Ao Root diam: 3.50 cm Ao Asc diam:  3.50 cm MITRAL VALVE MV Area (PHT): 5.13 cm     SHUNTS MV Decel Time: 148 msec     Systemic VTI:  0.19 m MR Peak grad: 61.2 mmHg     Systemic Diam: 2.70 cm MR Vmax:      391.00 cm/s MV E velocity: 69.40 cm/s MV A velocity: 129.00 cm/s MV E/A ratio:  0.54 Oswaldo Milian MD Electronically signed by Oswaldo Milian MD Signature Date/Time: 01/13/2021/11:23:19 AM    Final    VAS [place] LOWER EXTREMITY VENOUS (DVT)  Result Date: 01/23/2021  Lower Venous DVT Study Indications: Fever.  Limitations: Altered mental status, patient in restraints. Comparison Study: No prior study Performing Technologist: Sharion Dove RVS  Examination Guidelines: A complete evaluation includes B-mode imaging, spectral Doppler, color Doppler, and power Doppler as needed of all accessible portions of each vessel. Bilateral testing is considered an  integral part of a complete examination. Limited examinations for reoccurring indications may be performed as noted. The reflux portion of the exam is performed with the patient in reverse Trendelenburg.  +---------+---------------+---------+-----------+----------+--------------+ RIGHT    CompressibilityPhasicitySpontaneityPropertiesThrombus Aging  +---------+---------------+---------+-----------+----------+--------------+ CFV      Full           Yes      Yes                                 +---------+---------------+---------+-----------+----------+--------------+ SFJ      Full                                                        +---------+---------------+---------+-----------+----------+--------------+ FV Prox  Full                                                        +---------+---------------+---------+-----------+----------+--------------+ FV Mid   Full                                                        +---------+---------------+---------+-----------+----------+--------------+ FV DistalFull                                                        +---------+---------------+---------+-----------+----------+--------------+ PFV      Full                                                        +---------+---------------+---------+-----------+----------+--------------+ POP      Full           Yes      Yes                                 +---------+---------------+---------+-----------+----------+--------------+ PTV      Full                                                        +---------+---------------+---------+-----------+----------+--------------+ PERO     Full                                                        +---------+---------------+---------+-----------+----------+--------------+   +---------+---------------+---------+-----------+----------+--------------+ LEFT     CompressibilityPhasicitySpontaneityPropertiesThrombus Aging +---------+---------------+---------+-----------+----------+--------------+ CFV  Full           Yes      Yes                                 +---------+---------------+---------+-----------+----------+--------------+ SFJ      Full                                                         +---------+---------------+---------+-----------+----------+--------------+ FV Prox  Full                                                        +---------+---------------+---------+-----------+----------+--------------+ FV Mid   Full                                                        +---------+---------------+---------+-----------+----------+--------------+ FV DistalFull                                                        +---------+---------------+---------+-----------+----------+--------------+ PFV      Full                                                        +---------+---------------+---------+-----------+----------+--------------+ POP      Full           Yes      Yes                                 +---------+---------------+---------+-----------+----------+--------------+ PTV      Full                                                        +---------+---------------+---------+-----------+----------+--------------+ PERO     Full                                                        +---------+---------------+---------+-----------+----------+--------------+     Summary: RIGHT: - There is no evidence of deep vein thrombosis in the lower extremity. However, portions of this examination were limited- see technologist comments above.  LEFT: - There is no evidence of deep vein thrombosis in the lower extremity. However, portions of this examination were limited- see technologist comments above.  *See table(s) above for  measurements and observations. Electronically signed by Ruta Hinds MD on 01/23/2021 at 12:52:59 PM.    Final    US Abdomen Limited RUQ (LIVER/GB)  Result Date: 01/22/2021 CLINICAL DATA:  Abdominal pain and suspicious findings on recent CT examination. EXAM: ULTRASOUND ABDOMEN LIMITED RIGHT UPPER QUADRANT COMPARISON:  CT from earlier in the same day. FINDINGS: Gallbladder: Gallbladder is well distended with sludge within. No  pericholecystic fluid is noted. Mild wall thickening is noted to 5 mm. No cholelithiasis is seen. Common bile duct: Diameter: 6.2 mm. Liver: Mild fatty infiltration of the liver is noted. Portal vein is patent on color Doppler imaging with normal direction of blood flow towards the liver. Other: None. IMPRESSION: Gallbladder sludge without evidence of cholelithiasis. Some wall thickening is noted. These changes could represent some chronic cholecystitis. HIDA scan may be helpful in this regard. Electronically Signed   By: Inez Catalina M.D.   On: 01/22/2021 20:40    Labs:  CBC: Recent Labs    01/25/21 0514 01/26/21 0238 02/05/21 0016 02/06/21 0510  WBC 12.4* 9.0 25.1* 19.4*  HGB 12.7* 13.7 12.0* 10.3*  HCT 37.6* 40.7 35.4* 31.0*  PLT 240 287 280 229    COAGS: Recent Labs    02/05/21 0016 02/06/21 0510  INR 1.2 1.3*  APTT 33 32    BMP: Recent Labs    01/25/21 0514 01/26/21 0238 02/05/21 0016 02/06/21 0510  NA 132* 137 137 136  K 4.0 3.9 3.5 3.5  CL 99 102 101 103  CO2 25 26 20* 22  GLUCOSE 154* 142* 184* 214*  BUN 11 8 13 14   CALCIUM 8.9 9.2 8.9 8.7*  CREATININE 0.74 0.74 0.79 0.63  GFRNONAA >60 >60 >60 >60    LIVER FUNCTION TESTS: Recent Labs    01/25/21 0514 01/26/21 0238 02/05/21 0016 02/06/21 0510  BILITOT 0.7 0.9 6.2* 6.1*  AST 39 28 219* 172*  ALT 29 26 153* 132*  ALKPHOS 93 93 403* 341*  PROT 5.0* 5.4* 6.1* 5.5*  ALBUMIN 2.1* 2.3* 2.7* 2.3*    TUMOR MARKERS: No results for input(s): AFPTM, CEA, CA199, CHROMGRNA in the last 8760 hours.  Assessment and Plan: 70 y.o. 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  He was just discharged from [facility] on 4/22 with altered  mental status attributable to B12 deficiency and polypharmacy.  During that time patient had HIDA scan which was consistent with cholecystitis.  He was seen by surgery during that admission but since he was asymptomatic it was felt that he did not need further intervention.  LFTs were also normal during that admission.  He presented to [facility] last night with altered mental status/septic shock/hypotension.  He was started on IV vancomycin.  Ultrasound abdomen revealed gallbladder sludge and gallbladder wall thickening without evidence of cholelithiasis and fatty liver.MRCP done today revealed minimal extrahepatic biliary dilatation without evidence of choledocholithiasis, pancreatic lesion or pancreatic ductal dilatation, persistent abnormal appearance of the gallbladder with wall thickening and heterogeneous sludge, cholecystitis not excluded, scattered colonic diverticulosis without acute inflammation and stable supraumbilical hernia containing only fat.  He is currently afebrile, troponins negative, lactic acid normal, creatinine normal, total bilirubin 6.1, other LFTs elevated, COVID-19 negative, BC 19.4, hemoglobin 10.3, platelets 229K, PT15.9, INR 1.3.  Request now received from surgery  for percutaneous cholecystostomy.  Latest imaging studies have been reviewed by Dr. Earleen Newport.  Details/risks of procedure, including but not limited to, internal bleeding, infection, injury to adjacent structures, need for prolonged drainage discussed with patient's wife, Vincent [name], with her understanding and consent.  Procedure tentatively scheduled for 5/3.   Thank you for this interesting consult.  I greatly enjoyed meeting Vincent [name] and look forward to participating in their care.  A copy of this report was sent to the requesting provider on this date.  Electronically Signed: D. Rowe Robert, PA-C 02/06/2021, 4:49 PM   I spent a total of 40 minutes in face to face in clinical consultation,  greater than 50% of which was counseling/coordinating care for percutaneous cholecystostomy

## 2021-02-06 NOTE — H&P (Signed)
History and Physical    Vincent [name] XNT:[id number] DOB: 01/10/1951 DOA: 02/05/2021  PCP: Ginger Organ., MD    Patient coming from:  [facility].   Chief Complaint:  AMS   HPI: Vincent [name] is a 70 y.o. male seen in ed with complaints of AMS. No family at bedside.HPI is per chart review and Nursing notes as pt is encephalopathic. Pt is DNR and has most form.  Pt was d/c from hospital on 01/12/2021 to SNF for AMS attributed to B12 def,and polypharmacy, also in previus stay pt has HIDA and attributed his fever and leucocytosis to chronic cholecystitis, with gen surg rec no further intervention at that time.   Pt has past medical history of AMS, Chronic cholecystitis, C/H combined Sys / diastolic CHF,CAD s/p pci to lad, DM II.  ED Course:  Vitals:   02/06/21 0145 02/06/21 0200 02/06/21 0221 02/06/21 0230  BP:  103/60  (!) 104/53  Pulse: 90 88  84  Resp: (!) 33 (!) 24    Temp:   98.4 F (36.9 C)   TempSrc:   Rectal   SpO2: 95% 95%  95%  In ed pt is confused, dyspneic, Pt meets severe sepsis criteria and is hypotensive with leucocytosis, increased RR , HR above 90, source suspected urine or decub.  CMP shows elevated glucose of 184, AG of 16, ALK phos of 403, and AST of 219, ALT of 153. CBC shows wbc count 25.1, hb of 12, platelet of 280.  Review of Systems:  Review of Systems  Unable to perform ROS: Mental status change     Past Medical History:  Diagnosis Date  . Acute MI ([place])   . Allergy   . Anxiety   . Arthritis   . Coronary artery disease   . Depression   . Diabetes mellitus without complication ([place])   . Diverticulosis    mild  . Hemorrhoids   . HOH (hard of hearing)   . Hx of colonic polyps   . Hyperlipidemia   . Hypertension   . Left ventricular dysfunction    hx of    Past Surgical History:  Procedure Laterality Date  . ANGIOPLASTY     and bare metal stent placement  . ANKLE SURGERY Right 1986   i&d abscess- pt states "put a bone in it"   . CORONARY ANGIOPLASTY WITH STENT PLACEMENT    . KNEE ARTHROPLASTY Right 08/09/2016   Procedure: RIGHT TOTAL KNEE ARTHROPLASTY WITH COMPUTER NAVIGATION;  Surgeon: Rod Can, MD;  Location: [facility];  Service: Orthopedics;  Laterality: Right;  Needs RNFA  . LEFT HEART CATH AND CORONARY ANGIOGRAPHY N/A 11/21/2018   Procedure: LEFT HEART CATH AND CORONARY ANGIOGRAPHY;  Surgeon: Burnell Blanks, MD;  Location: [place] [facility];  Service: Cardiovascular;  Laterality: N/A;     reports that he quit smoking about 15 years ago. His smoking use included cigars. He quit smokeless tobacco use about 15 years ago. He reports that he does not drink alcohol and does not use drugs.  Allergies  Allergen Reactions  . Penicillins Other (See Comments)    Disputed in 2022 Has patient had a PCN reaction causing immediate rash, facial/tongue/throat swelling, SOB or lightheadedness with hypotension: Unknown Has patient had a PCN reaction causing severe rash involving mucus membranes or skin necrosis: Unknown Has patient had a PCN reaction that required hospitalization: Unknown Has patient had a PCN reaction occurring within the last 10 years: No If all of  the above answers are "NO", then may proceed with Cephalosporin use.     Family History  Problem Relation Age of Onset  . Coronary artery disease Other        positive family hx of  . Colon cancer Neg Hx   . Esophageal cancer Neg Hx   . Liver cancer Neg Hx   . Pancreatic cancer Neg Hx   . Rectal cancer Neg Hx   . Stomach cancer Neg Hx     Prior to Admission medications   Medication Sig Start Date End Date Taking? Authorizing Provider  aspirin EC 81 MG tablet Take 1 tablet (81 mg total) by mouth daily. Patient taking differently: Take 81 mg by mouth at bedtime. 11/04/17   Lyda Jester M, PA-C  docusate sodium (COLACE) 100 MG capsule Take 2 capsules (200 mg total) by mouth 2 (two) times daily. 01/27/21   Hosie Poisson, MD  furosemide  (LASIX) 40 MG tablet Take 1 tablet (40 mg total) by mouth daily. 01/27/21   Hosie Poisson, MD  isosorbide mononitrate (IMDUR) 30 MG 24 hr tablet Take 1 tablet (30 mg total) by mouth daily. 08/02/20   Burnell Blanks, MD  Maltodextrin-Xanthan Gum ([place]) POWD Use with every meals. 01/27/21   Hosie Poisson, MD  metFORMIN (GLUCOPHAGE) 1000 MG tablet Take 1,000 mg by mouth 2 (two) times daily.    [provider]  metoprolol tartrate (LOPRESSOR) 25 MG tablet Take 5 tablets (125 mg total) by mouth 2 (two) times daily. 01/27/21   Hosie Poisson, MD  Multiple Vitamin (MULTIVITAMIN WITH MINERALS) TABS tablet Take 1 tablet by mouth daily. 01/28/21   Hosie Poisson, MD  nitroGLYCERIN (NITROSTAT) 0.4 MG SL tablet Place 1 tablet (0.4 mg total) under the tongue every 5 (five) minutes as needed for chest pain. 11/04/17   Lyda Jester M, PA-C  polyethylene glycol (MIRALAX / GLYCOLAX) 17 g packet Take 17 g by mouth daily as needed. 01/27/21   Hosie Poisson, MD  QUEtiapine (SEROQUEL) 25 MG tablet Take 1 tablet (25 mg total) by mouth 2 (two) times daily. 01/27/21   Hosie Poisson, MD  senna-docusate (SENOKOT-S) 8.6-50 MG tablet Take 1 tablet by mouth at bedtime as needed for mild constipation. 01/27/21   Hosie Poisson, MD  simvastatin (ZOCOR) 40 MG tablet Take 1 tablet (40 mg total) by mouth at bedtime. 11/04/17   Lyda Jester M, PA-C  tamsulosin (FLOMAX) 0.4 MG CAPS capsule Take 1 capsule (0.4 mg total) by mouth daily. 01/28/21   Hosie Poisson, MD  thiamine 100 MG tablet Take 1 tablet (100 mg total) by mouth daily. 01/28/21   Hosie Poisson, MD  vitamin B-12 (CYANOCOBALAMIN) 1000 MCG tablet Take 1 tablet (1,000 mcg total) by mouth daily. 01/27/21   Hosie Poisson, MD    Physical Exam: Vitals:   02/06/21 0145 02/06/21 0200 02/06/21 0221 02/06/21 0230  BP:  103/60  (!) 104/53  Pulse: 90 88  84  Resp: (!) 33 (!) 24    Temp:   98.4 F (36.9 C)   TempSrc:   Rectal   SpO2: 95% 95%   95%   Physical Exam Vitals and nursing note reviewed.  Constitutional:      Interventions: Nasal cannula in place.  HENT:     Head: Normocephalic and atraumatic.     Right Ear: External ear normal.     Left Ear: External ear normal.     Nose: Nose normal.     Mouth/Throat:  Mouth: Mucous membranes are moist.  Eyes:     Extraocular Movements: Extraocular movements intact.     Pupils: Pupils are equal, round, and reactive to light.  Cardiovascular:     Rate and Rhythm: Normal rate and regular rhythm.     Pulses: Normal pulses.     Heart sounds: Normal heart sounds.  Pulmonary:     Effort: Pulmonary effort is normal.     Breath sounds: Normal breath sounds.  Abdominal:     General: Bowel sounds are normal. There is no distension.     Palpations: Abdomen is soft. There is no mass.     Tenderness: There is no abdominal tenderness. There is no guarding.     Hernia: No hernia is present.  Musculoskeletal:     Right lower leg: No edema.     Left lower leg: No edema.  Skin:    General: Skin is warm.  Neurological:     General: No focal deficit present.     Mental Status: He is lethargic.    Labs on Admission: I have personally reviewed following labs and imaging studies  No results for input(s): CKTOTAL, CKMB, TROPONINI in the last 72 hours. Lab Results  Component Value Date   WBC 25.1 (H) 02/05/2021   HGB 12.0 (L) 02/05/2021   HCT 35.4 (L) 02/05/2021   MCV 89.2 02/05/2021   PLT 280 02/05/2021    Recent Labs  Lab 02/05/21 0016  NA 137  K 3.5  CL 101  CO2 20*  BUN 13  CREATININE 0.79  CALCIUM 8.9  PROT 6.1*  BILITOT 6.2*  ALKPHOS 403*  ALT 153*  AST 219*  GLUCOSE 184*   Urinalysis    Component Value Date/Time   COLORURINE AMBER (A) 02/06/2021 0150   APPEARANCEUR HAZY (A) 02/06/2021 0150   LABSPEC 1.010 02/06/2021 0150   PHURINE 6.0 02/06/2021 0150   GLUCOSEU NEGATIVE 02/06/2021 0150   HGBUR MODERATE (A) 02/06/2021 0150   BILIRUBINUR MODERATE (A)  02/06/2021 0150   KETONESUR NEGATIVE 02/06/2021 0150   PROTEINUR NEGATIVE 02/06/2021 0150   UROBILINOGEN 0.2 01/06/2014 1257   NITRITE NEGATIVE 02/06/2021 0150   LEUKOCYTESUR MODERATE (A) 02/06/2021 0150    COVID-19 Labs No results for input(s): DDIMER, FERRITIN, LDH, CRP in the last 72 hours.  Lab Results  Component Value Date   SARSCOV2NAA NEGATIVE 01/26/2021   [place] NEGATIVE 01/12/2021    Radiological Exams on Admission: US Abdomen Limited  Result Date: 02/06/2021 CLINICAL DATA:  AST abnormality. EXAM: ULTRASOUND ABDOMEN LIMITED RIGHT UPPER QUADRANT COMPARISON:  January 22, 2021 FINDINGS: Gallbladder: A large amount of heterogeneous sludge is seen within the gallbladder lumen. No gallstones are identified. The gallbladder wall measures 5.2 mm in thickness. No sonographic Murphy sign noted by sonographer. Common bile duct: Diameter: 7.6 mm Liver: No focal lesion identified. Diffusely increased echogenicity of the liver parenchyma is seen. Portal vein is patent on color Doppler imaging with normal direction of blood flow towards the liver. Other: None.  IMPRESSION:  1. Gallbladder sludge and gallbladder wall thickening without evidence of cholelithiasis. While this may represent sequelae associated with chronic cholecystitis, further evaluation with a nuclear medicine hepatobiliary scan is recommended. 2. Fatty liver. Electronically Signed   By: Virgina Norfolk M.D.   On: 02/06/2021 02:43   DG Chest Port 1 View Result Date: 02/06/2021 CLINICAL DATA:  Slurred speech. EXAM: PORTABLE CHEST 1 VIEW COMPARISON:  January 21, 2021 FINDINGS: Decreased lung volumes are seen which is likely secondary to the  degree of patient inspiration. Very mild left basilar atelectasis is noted. There is no evidence of a pleural effusion or pneumothorax. The heart size and mediastinal contours are within normal limits. Mild calcification of the aortic arch is noted. The visualized skeletal structures are  unremarkable. IMPRESSION: Very mild left basilar atelectasis. Electronically Signed   By: Virgina Norfolk M.D.   On: 02/06/2021 01:14    EKG: Independently reviewed.  Sinus rhythm at 94, no St changes or T wave change.  Echocardiogram 01/2021 : IMPRESSIONS: 1. Left ventricular ejection fraction, by estimation, is 45 to 50%. The  left ventricle has mildly decreased function. The left ventricle  demonstrates regional wall motion abnormalities (see scoring  diagram/findings for description). Apical akinesis.  Left ventricular diastolic parameters are consistent with Grade I  diastolic dysfunction (impaired relaxation).  2. Swirling of contrast at apex consistent with low flow but no thrombus  seen  3. Right ventricular systolic function is normal. The right ventricular  size is normal. Tricuspid regurgitation signal is inadequate for assessing  PA pressure.  4. The mitral valve is normal in structure. No evidence of mitral valve  regurgitation.  5. The aortic valve is tricuspid. Aortic valve regurgitation is trivial.  Mild to moderate aortic valve sclerosis/calcification is present, without  any evidence of aortic stenosis.  6. The inferior vena cava is normal in size with greater than 50%  respiratory variability, suggesting right atrial pressure of 3 mmHg.    Assessment/Plan Principal Problem:   Severe sepsis with septic shock (HCC) Active Problems:   Chronic cholecystitis   Sacral decubitus ulcer   Coronary atherosclerosis   Diabetes 1.5, managed as type 2 (HCC)   Severe sepsis with septic shock: -Admit to stepdown unit in case of vasopressor need. -Cultures collected.Source d/d include biliary , urinary or both.  -IV abx with pharmacy consult for broad spectrum gram positive MRSA and gram neg coverage. -ggt pending but most likely biliary issue. -gen surg consult per am team.  -due to h/o chf pt did not receive 30cc/hour IVFR but d/w edmd about boluses with 500 cc  to monitor bp and volume status.  C/H cholecystitis: -previous HIDA reviewed, Gen surg consult per am team.  -cont iv abx.  Sacral decub: We will get imaging and documentation with skin and wound care.  CAD: Resume statin, asa therapy once pt is alert and able to swallow.  DM II: Ssi/ accucheck. Hypoglycemia protocol.  Acute encephalopathy: d/d include related to sepsis , dehydration. Treat underlying etiology and hydrate pt.        DVT prophylaxis:  Heparin  Code Status:  MOST form.  Family Communication:  Oluwafemi, Villella (Spouse)  [phone] (Home Phone)  Disposition Plan:  Home   Consults called:  None   Admission status: Inpatient.    Para Skeans MD [facility] [phone] How to contact the [facility] Attending or Consulting provider [place] or covering provider during after hours [place], for this patient.    1. Check the care team in [facility] and look for a) attending/consulting [place] provider listed and b) the [facility] team listed 2. Log into [email or web address] and use [place]'s universal password to access. If you do not have the password, please contact the hospital operator. 3. Locate the [facility] provider you are looking for under [facility] and page to a number that you can be directly reached. 4. If you still have difficulty reaching the provider, please page the [facility] (Director on Call) for the Hospitalists  listed on amion for assistance. [email or web address] Password [facility] 02/06/2021, 2:48 AM

## 2021-02-06 NOTE — Consult Note (Addendum)
Consultation  Referring Provider: [facility]/ Horris Latino Primary Care Physician:  Ginger Organ., MD Primary Gastroenterologist:  Dr.Stark  Reason for Consultation: Sepsis, elevated LFTs, dilated CBD and abnormal gallbladder on imaging  HPI: Vincent [name] is a 70 y.o. male, with history of coronary artery disease, congestive heart failure with EF of 55%, adult onset diabetes mellitus, chronic pain syndrome who was admitted last night with altered mental status.  He was transferred from [place] home.  Work-up in the ER revealed severe sepsis with septic shock/hypotension.  He has been started on IV vancomycin. Labs revealed WBC of 25.1, glucose 184/creatinine 0.79/T bili 6.2/alk phos 403/AST 219/ALT 153/INR 1.2/lactate 2.2/COVID-19 negative Drug screen positive for THC  Upper abdominal ultrasound shows a large amount of sludge in the gallbladder no gallstones, gallbladder wall 5.2 mm, CBD 7.6 mm, fatty liver  Chest x-ray mild left basilar atelectasis  Patient had just been discharged after recent prolonged hospitalization 4/7 through 01/27/2021 with a similar presentation of altered mental status which was attributed to B12 deficiency and polypharmacy.  He did have low-grade fevers associated and imaging with ultrasound and then HIDA scan both consistent with cholecystitis, HIDA scan on 01/24/2021 showed nonvisualization of the gallbladder.  Patient was seen by surgery during that admission but as he was not having any abdominal pain it was not felt that he needed any intervention. LFTs were normal during that admission.  Patient is alert and talkative though confused to events.  He is hungry and upset that he cannot eat.  He has no complaints of abdominal pain, no nausea or vomiting, no diarrhea.  He has been afebrile, has not required pressors and has been hemodynamically stable overnight.  Past Medical History:  Diagnosis Date  . Acute MI ([place])   . Allergy   . Anxiety   .  Arthritis   . Coronary artery disease   . Depression   . Diabetes mellitus without complication ([place])   . Diverticulosis    mild  . Hemorrhoids   . HOH (hard of hearing)   . Hx of colonic polyps   . Hyperlipidemia   . Hypertension   . Left ventricular dysfunction    hx of    Past Surgical History:  Procedure Laterality Date  . ANGIOPLASTY     and bare metal stent placement  . ANKLE SURGERY Right 1986   i&d abscess- pt states "put a bone in it"  . CORONARY ANGIOPLASTY WITH STENT PLACEMENT    . KNEE ARTHROPLASTY Right 08/09/2016   Procedure: RIGHT TOTAL KNEE ARTHROPLASTY WITH COMPUTER NAVIGATION;  Surgeon: Rod Can, MD;  Location: [facility];  Service: Orthopedics;  Laterality: Right;  Needs RNFA  . LEFT HEART CATH AND CORONARY ANGIOGRAPHY N/A 11/21/2018   Procedure: LEFT HEART CATH AND CORONARY ANGIOGRAPHY;  Surgeon: Burnell Blanks, MD;  Location: [place] [facility];  Service: Cardiovascular;  Laterality: N/A;    Prior to Admission medications   Medication Sig Start Date End Date Taking? Authorizing Provider  aspirin EC 81 MG tablet Take 1 tablet (81 mg total) by mouth daily. Patient taking differently: Take 81 mg by mouth at bedtime. 11/04/17   Lyda Jester M, PA-C  docusate sodium (COLACE) 100 MG capsule Take 2 capsules (200 mg total) by mouth 2 (two) times daily. 01/27/21   Hosie Poisson, MD  furosemide (LASIX) 40 MG tablet Take 1 tablet (40 mg total) by mouth daily. 01/27/21   Hosie Poisson, MD  isosorbide mononitrate (IMDUR) 30 MG  24 hr tablet Take 1 tablet (30 mg total) by mouth daily. 08/02/20   Burnell Blanks, MD  Maltodextrin-Xanthan Gum ([place]) POWD Use with every meals. 01/27/21   Hosie Poisson, MD  metFORMIN (GLUCOPHAGE) 1000 MG tablet Take 1,000 mg by mouth 2 (two) times daily.    [provider]  metoprolol tartrate (LOPRESSOR) 25 MG tablet Take 5 tablets (125 mg total) by mouth 2 (two) times daily. 01/27/21   Hosie Poisson, MD  Multiple Vitamin (MULTIVITAMIN WITH MINERALS) TABS tablet Take 1 tablet by mouth daily. 01/28/21   Hosie Poisson, MD  nitroGLYCERIN (NITROSTAT) 0.4 MG SL tablet Place 1 tablet (0.4 mg total) under the tongue every 5 (five) minutes as needed for chest pain. 11/04/17   Lyda Jester M, PA-C  polyethylene glycol (MIRALAX / GLYCOLAX) 17 g packet Take 17 g by mouth daily as needed. 01/27/21   Hosie Poisson, MD  QUEtiapine (SEROQUEL) 25 MG tablet Take 1 tablet (25 mg total) by mouth 2 (two) times daily. 01/27/21   Hosie Poisson, MD  senna-docusate (SENOKOT-S) 8.6-50 MG tablet Take 1 tablet by mouth at bedtime as needed for mild constipation. 01/27/21   Hosie Poisson, MD  simvastatin (ZOCOR) 40 MG tablet Take 1 tablet (40 mg total) by mouth at bedtime. 11/04/17   Lyda Jester M, PA-C  tamsulosin (FLOMAX) 0.4 MG CAPS capsule Take 1 capsule (0.4 mg total) by mouth daily. 01/28/21   Hosie Poisson, MD  thiamine 100 MG tablet Take 1 tablet (100 mg total) by mouth daily. 01/28/21   Hosie Poisson, MD  vitamin B-12 (CYANOCOBALAMIN) 1000 MCG tablet Take 1 tablet (1,000 mcg total) by mouth daily. 01/27/21   Hosie Poisson, MD    Current Facility-Administered Medications  Medication Dose Route Frequency Provider Last Rate Last Admin  . 0.9 %  sodium chloride infusion   Intravenous Continuous Para Skeans, MD 50 mL/hr at 02/06/21 0512 New Bag at 02/06/21 0512  . aztreonam (AZACTAM) 1 g in sodium chloride 0.9 % 100 mL IVPB  1 g Intravenous Q8H Patel, Ekta V, MD      . heparin injection 5,000 Units  5,000 Units Subcutaneous Q8H Patel, Ekta V, MD      . insulin aspart (novoLOG) injection 0-15 Units  0-15 Units Subcutaneous TID WC Florina Ou V, MD      . metroNIDAZOLE (FLAGYL) IVPB 500 mg  500 mg Intravenous Q8H Florina Ou V, MD 100 mL/hr at 02/06/21 0430 500 mg at 02/06/21 0430  . pantoprazole (PROTONIX) injection 40 mg  40 mg Intravenous Q12H Para Skeans, MD      . Derrill Memo ON 02/07/2021] vancomycin  (VANCOREADY) IVPB 2000 mg/400 mL  2,000 mg Intravenous Q24H Thomes Lolling, [facility]        Allergies as of 02/05/2021 - Review Complete 01/12/2021  Allergen Reaction Noted  . Penicillins Other (See Comments) 06/20/2009    Family History  Problem Relation Age of Onset  . Coronary artery disease Other        positive family hx of  . Colon cancer Neg Hx   . Esophageal cancer Neg Hx   . Liver cancer Neg Hx   . Pancreatic cancer Neg Hx   . Rectal cancer Neg Hx   . Stomach cancer Neg Hx     Social History   Socioeconomic History  . Marital status: Married    Spouse name: Not on file  . Number of children: Not on file  . Years of  education: Not on file  . Highest education level: Not on file  Occupational History  . Occupation: full time    Employer: [employer]  Tobacco Use  . Smoking status: Former Smoker    Types: Cigars    Quit date: 12/17/2005    Years since quitting: 15.1  . Smokeless tobacco: Former Systems developer    Quit date: 12/17/2005  Vaping Use  . Vaping Use: Never used  Substance and Sexual Activity  . Alcohol use: No  . Drug use: No  . Sexual activity: Not on file  Other Topics Concern  . Not on file  Social History Narrative  . Not on file   Social Determinants of Health   Financial Resource Strain: Not on file  Food Insecurity: No Food Insecurity  . Worried About Charity fundraiser in the Last Year: Never true  . Ran Out of Food in the Last Year: Never true  Transportation Needs: No Transportation Needs  . Lack of Transportation (Medical): No  . Lack of Transportation (Non-Medical): No  Physical Activity: Not on file  Stress: Not on file  Social Connections: Not on file  Intimate Partner Violence: Not on file    Review of Systems: Pertinent positive and negative review of systems were noted in the above HPI section.  All other review of systems was otherwise negative.  Physical Exam: Vital signs in last 24 hours: Temp:  [97.6 F (36.4  C)-100.9 F (38.3 C)] 97.6 F (36.4 C) (05/02 0658) Pulse Rate:  [78-97] 78 (05/02 0658) Resp:  [13-42] 20 (05/02 0658) BP: (94-118)/(53-71) 101/53 (05/02 0658) SpO2:  [89 %-100 %] 100 % (05/02 0658) Weight:  [90.6 kg] 90.6 kg (05/02 0636) Last BM Date: 02/06/21 General:   Alert,  Well-developed, well-nourished, older white male, confused but conversant  head:  Normocephalic and atraumatic. Eyes:  Sclera icteric.   Conjunctiva pink. Ears:  Normal auditory acuity. Nose:  No deformity, discharge,  or lesions. Mouth:  No deformity or lesions.   Neck:  Supple; no masses or thyromegaly. Lungs:  Clear throughout to auscultation.   No wheezes, crackles, or rhonchi. Heart:  Regular rate and rhythm; no murmurs, clicks, rubs,  or gallops. Abdomen:  Soft,nontender, BS active,nonpalp mass or hsm.   Rectal:  Deferred  Msk:  Symmetrical without gross deformities. . Pulses:  Normal pulses noted. Extremities:  Without clubbing or edema. Neurologic:  Alert and  oriented x1 Skin:  Intact without significant lesions or rashes.. Psych:  Alert and cooperative.   Intake/Output from previous day: 05/01 0701 - 05/02 0700 In: 900 [IV Piggyback:900] Out: -  Intake/Output this shift: No intake/output data recorded.  Lab Results: Recent Labs    02/05/21 0016 02/06/21 0510  WBC 25.1* 19.4*  HGB 12.0* 10.3*  HCT 35.4* 31.0*  PLT 280 229   BMET Recent Labs    02/05/21 0016 02/06/21 0510  NA 137 136  K 3.5 3.5  CL 101 103  CO2 20* 22  GLUCOSE 184* 214*  BUN 13 14  CREATININE 0.79 0.63  CALCIUM 8.9 8.7*   LFT Recent Labs    02/06/21 0510  PROT 5.5*  ALBUMIN 2.3*  AST 172*  ALT 132*  ALKPHOS 341*  BILITOT 6.1*   PT/INR Recent Labs    02/05/21 0016 02/06/21 0510  LABPROT 15.6* 15.9*  INR 1.2 1.3*   Hepatitis Panel No results for input(s): HEPBSAG, HCVAB, HEPAIGM, HEPBIGM in the last 72 hours.    IMPRESSION:  #1  70 year old white male admitted from skilled nursing  facility with altered mental status, met criteria for sepsis with hypotension. He has not required pressors and has been hemodynamically stable overnight. On IV vancomycin Admitting labs revealed elevated LFTs with hyperbilirubinemia/T bili 6.1 Ultrasound shows gallbladder sludge and a dilated CBD at 7.6 mm (ultrasound last month with CBD of 6.2 mm There is gallbladder wall thickening to 5.2 mm consistent with acute versus chronic cholecystitis  Rule out choledocholithiasis, rule out Mirizzi syndrome  Recent admission 4/7 through 01/27/2021 with confusion/altered mental status Positive HIDA scan during that admission with nonvisualization of the gallbladder on 01/24/2021 and ultrasound revealing gallbladder wall thickening and CBD of 6.2 mm Surgery did not feel that he had acute cholecystitis, possible chronic cholecystitis and as he was asymptomatic no intervention recommended   #2 coronary artery disease 3.  Congestive heart failure EF 55% 4.  Adult onset diabetes mellitus 5.  Chronic pain syndrome  Plan; keep n.p.o. for now until post MRI Have scheduled for MRI/MRCP to rule out CBD stone Please ask surgery to see, may need cholecystostomy versus cholecystectomy Continue IV antibiotics/vancomycin Repeat labs in a.m. We will follow with you     Charisse Wendell EsterwoodPA-C  02/06/2021, 8:53 AM

## 2021-02-06 NOTE — Plan of Care (Signed)
  Problem: Education: Goal: Knowledge of General Education information will improve Description: Including pain rating scale, medication(s)/side effects and non-pharmacologic comfort measures Outcome: Progressing   Problem: Clinical Measurements: Goal: Ability to maintain clinical measurements within normal limits will improve Outcome: Progressing Goal: Will remain free from infection Outcome: Progressing Goal: Diagnostic test results will improve Outcome: Progressing Goal: Respiratory complications will improve Outcome: Progressing   Problem: Activity: Goal: Risk for activity intolerance will decrease Outcome: Progressing   Problem: Nutrition: Goal: Adequate nutrition will be maintained Outcome: Progressing   Problem: Elimination: Goal: Will not experience complications related to bowel motility Outcome: Progressing Goal: Will not experience complications related to urinary retention Outcome: Progressing   Problem: Pain Managment: Goal: General experience of comfort will improve Outcome: Progressing   Problem: Safety: Goal: Ability to remain free from injury will improve Outcome: Progressing   Problem: Skin Integrity: Goal: Risk for impaired skin integrity will decrease Outcome: Progressing

## 2021-02-06 NOTE — Progress Notes (Signed)
Pharmacy Antibiotic Note  Vincent [name] is a 70 y.o. male admitted on 02/05/2021 with sepsis.  Pharmacy has been consulted for Vancomycin dosing. PCN allergy noted.  No documented hx of cephalosporin exposure.  He received Meropenem during most recent hospitalization.   Plan: Vancomycin 2gm IV q24h to target AUC 400-550 Check Vancomycin levels at steady state     Temp (24hrs), Avg:99.7 F (37.6 C), Min:98.4 F (36.9 C), Max:100.9 F (38.3 C)  Recent Labs  Lab 02/05/21 0016 02/06/21 0016  WBC 25.1*  --   CREATININE 0.79  --   LATICACIDVEN  --  2.2*    CrCl cannot be calculated (Unknown ideal weight.).    Allergies  Allergen Reactions  . Penicillins Other (See Comments)    Disputed in 2022 Has patient had a PCN reaction causing immediate rash, facial/tongue/throat swelling, SOB or lightheadedness with hypotension: Unknown Has patient had a PCN reaction causing severe rash involving mucus membranes or skin necrosis: Unknown Has patient had a PCN reaction that required hospitalization: Unknown Has patient had a PCN reaction occurring within the last 10 years: No If all of the above answers are "NO", then may proceed with Cephalosporin use.     Antimicrobials this admission: 5/2 Aztreonam >>  5/2 Vancomycin >>  5/2 Metronidazole >>  Dose adjustments this admission:  Microbiology results: 5/2 BCx:  5/2 UCx:    Thank you for allowing pharmacy to be a part of this patient's care.  Netta Cedars PharmD 02/06/2021 2:58 AM

## 2021-02-06 NOTE — Progress Notes (Signed)
Elink following for Sepsis Protocol 

## 2021-02-06 NOTE — Plan of Care (Signed)
  Problem: Education: Goal: Knowledge of General Education information will improve Description: Including pain rating scale, medication(s)/side effects and non-pharmacologic comfort measures Outcome: Progressing   Problem: Clinical Measurements: Goal: Ability to maintain clinical measurements within normal limits will improve Outcome: Progressing Goal: Will remain free from infection Outcome: Progressing Goal: Diagnostic test results will improve Outcome: Progressing Goal: Respiratory complications will improve Outcome: Progressing   Problem: Activity: Goal: Risk for activity intolerance will decrease Outcome: Progressing   Problem: Nutrition: Goal: Adequate nutrition will be maintained Outcome: Progressing

## 2021-02-07 ENCOUNTER — Inpatient Hospital Stay (HOSPITAL_COMMUNITY): Payer: HMO

## 2021-02-07 DIAGNOSIS — N390 Urinary tract infection, site not specified: Secondary | ICD-10-CM

## 2021-02-07 DIAGNOSIS — K811 Chronic cholecystitis: Secondary | ICD-10-CM

## 2021-02-07 DIAGNOSIS — L89159 Pressure ulcer of sacral region, unspecified stage: Secondary | ICD-10-CM

## 2021-02-07 DIAGNOSIS — R6520 Severe sepsis without septic shock: Secondary | ICD-10-CM

## 2021-02-07 HISTORY — PX: IR PERC CHOLECYSTOSTOMY: IMG2326

## 2021-02-07 LAB — PROCALCITONIN: Procalcitonin: 1.88 ng/mL

## 2021-02-07 LAB — CBC WITH DIFFERENTIAL/PLATELET
Abs Immature Granulocytes: 0.09 10*3/uL — ABNORMAL HIGH (ref 0.00–0.07)
Basophils Absolute: 0 10*3/uL (ref 0.0–0.1)
Basophils Relative: 0 %
Eosinophils Absolute: 0.1 10*3/uL (ref 0.0–0.5)
Eosinophils Relative: 1 %
HCT: 33 % — ABNORMAL LOW (ref 39.0–52.0)
Hemoglobin: 10.5 g/dL — ABNORMAL LOW (ref 13.0–17.0)
Immature Granulocytes: 1 %
Lymphocytes Relative: 18 %
Lymphs Abs: 1.8 10*3/uL (ref 0.7–4.0)
MCH: 29.7 pg (ref 26.0–34.0)
MCHC: 31.8 g/dL (ref 30.0–36.0)
MCV: 93.2 fL (ref 80.0–100.0)
Monocytes Absolute: 0.7 10*3/uL (ref 0.1–1.0)
Monocytes Relative: 7 %
Neutro Abs: 7.5 10*3/uL (ref 1.7–7.7)
Neutrophils Relative %: 73 %
Platelets: 253 10*3/uL (ref 150–400)
RBC: 3.54 MIL/uL — ABNORMAL LOW (ref 4.22–5.81)
RDW: 14.3 % (ref 11.5–15.5)
WBC: 10.3 10*3/uL (ref 4.0–10.5)
nRBC: 0 % (ref 0.0–0.2)

## 2021-02-07 LAB — GLUCOSE, CAPILLARY
Glucose-Capillary: 102 mg/dL — ABNORMAL HIGH (ref 70–99)
Glucose-Capillary: 111 mg/dL — ABNORMAL HIGH (ref 70–99)
Glucose-Capillary: 131 mg/dL — ABNORMAL HIGH (ref 70–99)
Glucose-Capillary: 132 mg/dL — ABNORMAL HIGH (ref 70–99)
Glucose-Capillary: 183 mg/dL — ABNORMAL HIGH (ref 70–99)
Glucose-Capillary: 203 mg/dL — ABNORMAL HIGH (ref 70–99)
Glucose-Capillary: 96 mg/dL (ref 70–99)

## 2021-02-07 LAB — COMPREHENSIVE METABOLIC PANEL
ALT: 104 U/L — ABNORMAL HIGH (ref 0–44)
AST: 110 U/L — ABNORMAL HIGH (ref 15–41)
Albumin: 2.4 g/dL — ABNORMAL LOW (ref 3.5–5.0)
Alkaline Phosphatase: 294 U/L — ABNORMAL HIGH (ref 38–126)
Anion gap: 9 (ref 5–15)
BUN: 10 mg/dL (ref 8–23)
CO2: 22 mmol/L (ref 22–32)
Calcium: 8.7 mg/dL — ABNORMAL LOW (ref 8.9–10.3)
Chloride: 105 mmol/L (ref 98–111)
Creatinine, Ser: 0.56 mg/dL — ABNORMAL LOW (ref 0.61–1.24)
GFR, Estimated: >60 mL/min (ref >60)
Glucose, Bld: 111 mg/dL — ABNORMAL HIGH (ref 70–99)
Potassium: 3.4 mmol/L — ABNORMAL LOW (ref 3.5–5.1)
Sodium: 136 mmol/L (ref 135–145)
Total Bilirubin: 3 mg/dL — ABNORMAL HIGH (ref 0.3–1.2)
Total Protein: 5.6 g/dL — ABNORMAL LOW (ref 6.5–8.1)

## 2021-02-07 LAB — PROTIME-INR
INR: 1.2 (ref 0.8–1.2)
Prothrombin Time: 14.9 seconds (ref 11.4–15.2)

## 2021-02-07 IMAGING — XA IR CHOLECYSTOSTOMY
6 series · 13 of 18 positions shown · non-contrast
Comparison: MRCP-[DATE];

INDICATION: Concern for acute cholecystitis. Poor operative candidate. Please
perform image guided cholecystostomy tube placement for infection
source control purposes.

EXAM:
ULTRASOUND AND FLUOROSCOPIC-GUIDED CHOLECYSTOSTOMY TUBE PLACEMENT

[Series 1: fl - angio · 2 of 62 frames shown (1 of 3)]
[frame 10/62]
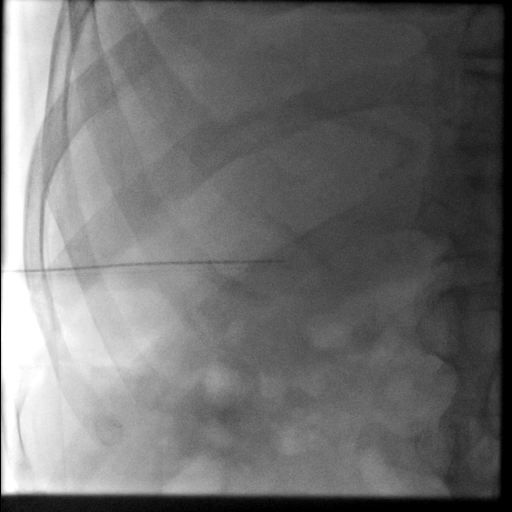
[frame 53/62]
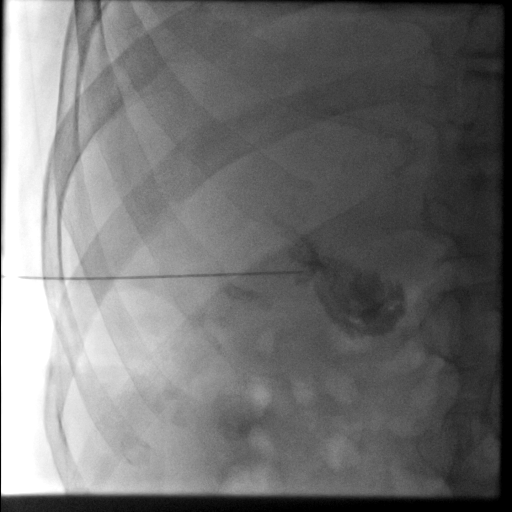

[Series 2: ir cholecystostomy · 2 of 2 slices shown]
[im 1/2]
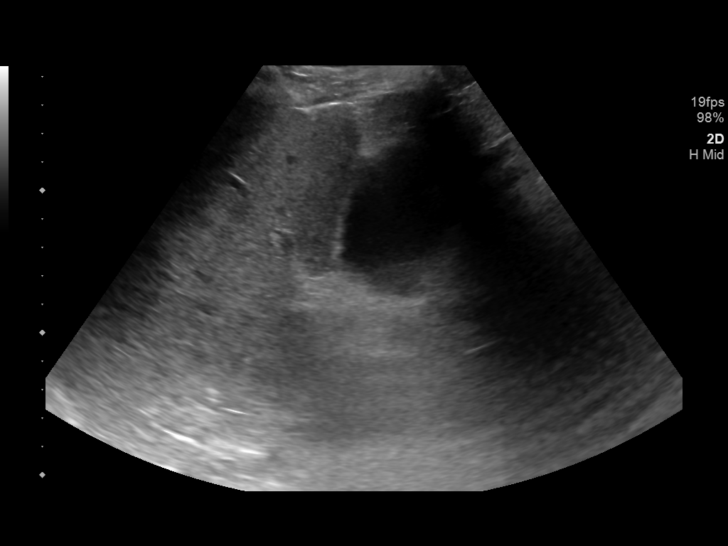
[im 2/2]
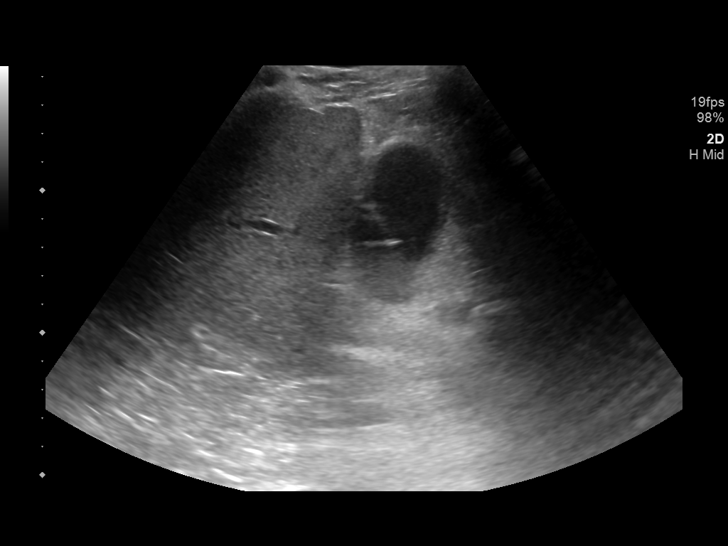

[Series 3: fl - angio · 2 of 96 frames shown (2 of 3)]
[frame 44/96]
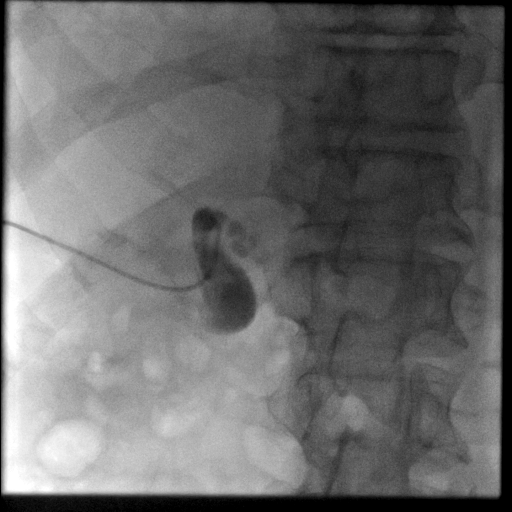
[frame 49/96]
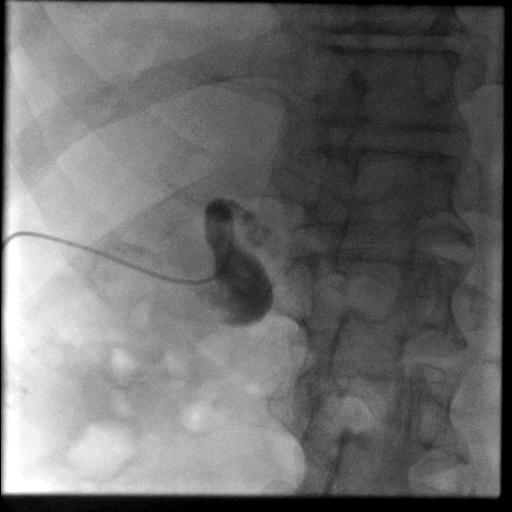

[Series 5: fl - angio · 3 of 53 frames shown (3 of 3)]
[frame 8/53]
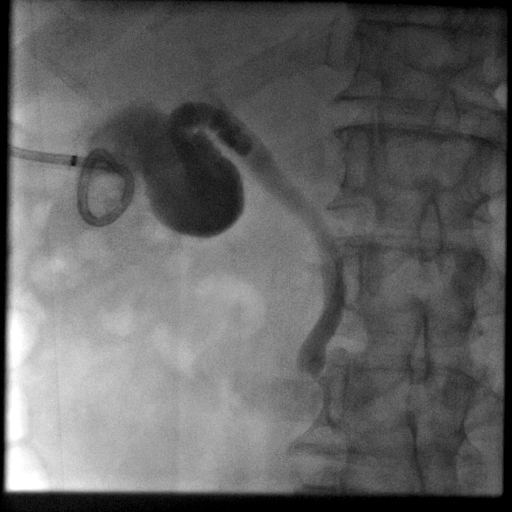
[frame 27/53]
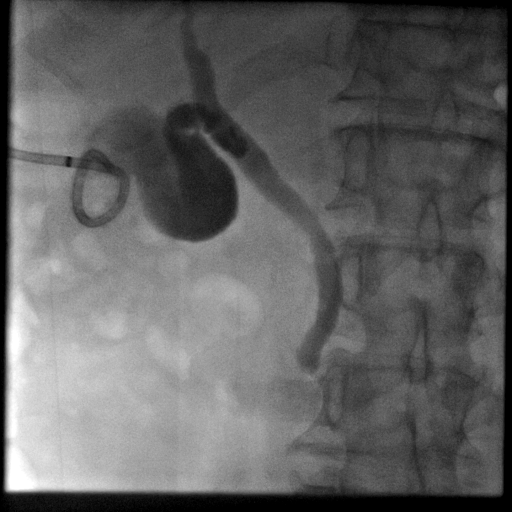
[frame 46/53]
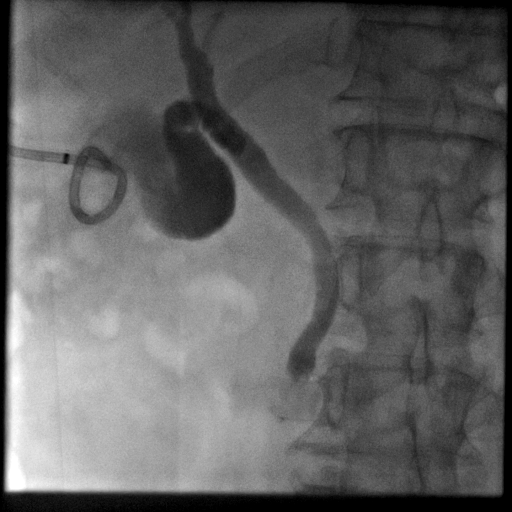

[Series 6: care single · 2 of 2 slices shown]
[im 1/2]
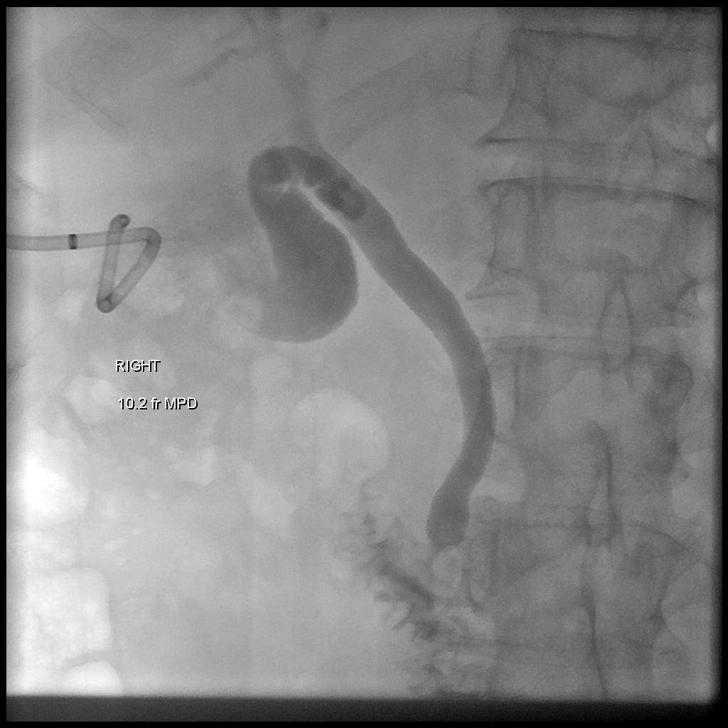
[im 2/2]
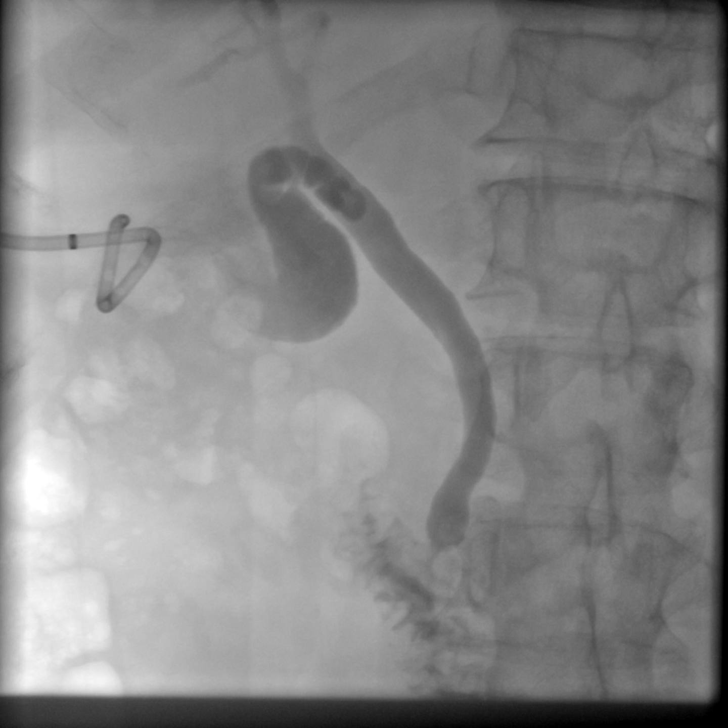

[Series 300: tube placements · 2 of 3 slices shown]
[im 1/3]
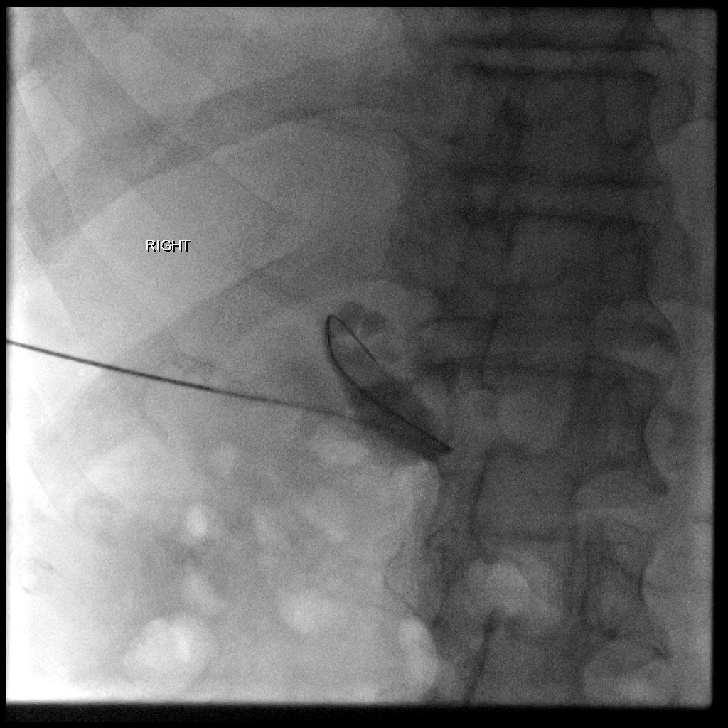
[im 3/3]
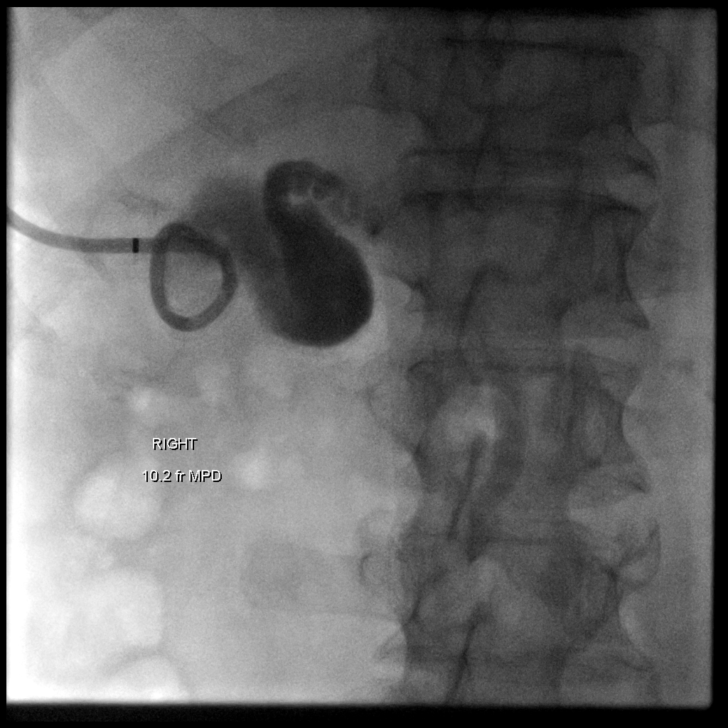

[13 of 18 positions shown; findings below may reference images not displayed]

right upper quadrant abdominal
ultrasound-[DATE]; nuclear medicine HIDA scan-[DATE]; CT the
chest, abdomen and pelvis-[DATE]

MEDICATIONS:
The patient is currently admitted to the hospital and on intravenous
antibiotics. Antibiotics were administered within an appropriate
time frame prior to skin puncture.

ANESTHESIA/SEDATION:
Moderate (conscious) sedation was employed during this procedure.
Fentanyl 100 mcg was administered intravenously.

Moderate Sedation Time: 14 minutes. The patient's level of
consciousness and vital signs were monitored continuously by
radiology nursing throughout the procedure under my direct
supervision.

CONTRAST:  15mL OMNIPAQUE IOHEXOL 300 MG/ML SOLN - administered into
the gallbladder fossa.

FLUOROSCOPY TIME:  1 minute, 36 seconds (92 mGy)

COMPLICATIONS:
None immediate.

PROCEDURE:
Informed written consent was obtained from the patient and the
patient's family after a discussion of the risks, benefits and
alternatives to treatment. Questions regarding the procedure were
encouraged and answered. A timeout was performed prior to the
initiation of the procedure.

The right upper abdominal quadrant was prepped and draped in the
usual sterile fashion, and a sterile drape was applied covering the
operative field. Maximum barrier sterile technique with sterile
gowns and gloves were used for the procedure. A timeout was
performed prior to the initiation of the procedure. Local anesthesia
was provided with 1% lidocaine with epinephrine.

Ultrasound scanning of the right upper quadrant demonstrates a
mildly dilated gallbladder with minimal amount of pericholecystic
fluid. Utilizing a transhepatic approach, a 22 gauge needle was
advanced into the gallbladder under direct ultrasound guidance. An
ultrasound image was saved for documentation purposes. Appropriate
intraluminal puncture was confirmed with the efflux of bile and
advancement of an 0.018 wire into the gallbladder lumen. The needle
was exchanged for an Accustick set. A small amount of contrast was
injected to confirm appropriate intraluminal positioning. Over a
Benson wire, a 10.2-MUT cholecystomy tube was advanced into
the gallbladder fossa, coiled and locked. Bile was aspirated and a
small amount of contrast was injected as several post procedural
spot radiographic images were obtained in various obliquities
demonstrating passage of contrast through the cystic and common bile
ducts to the level of the duodenum. The catheter was secured to the
skin with suture, connected to a drainage bag and a dressing was
placed. The patient tolerated the procedure well without immediate
post procedural complication.
IMPRESSION: 1. Successful ultrasound and fluoroscopic guided placement of a
French cholecystostomy tube.
2. Limited cholangiogram performed following cholecystostomy tube
placement demonstrates patency of the cystic and common bile ducts
with passage of contrast to the level of duodenum.

## 2021-02-07 MED ORDER — SODIUM CHLORIDE 0.9% FLUSH
5.0000 mL | Freq: Three times a day (TID) | INTRAVENOUS | Status: DC
  Administered 2021-02-07 – 2021-02-11 (×9): 5 mL

## 2021-02-07 MED ORDER — LIDOCAINE-EPINEPHRINE 1 %-1:100000 IJ SOLN
INTRAMUSCULAR | Status: AC | PRN
Start: 2021-02-07 — End: 2021-02-07
  Administered 2021-02-07: 10 mL

## 2021-02-07 MED ORDER — FENTANYL CITRATE (PF) 100 MCG/2ML IJ SOLN
INTRAMUSCULAR | Status: AC
  Filled 2021-02-07: qty 2

## 2021-02-07 MED ORDER — ENOXAPARIN SODIUM 40 MG/0.4ML IJ SOSY
40.0000 mg | PREFILLED_SYRINGE | INTRAMUSCULAR | Status: DC
  Administered 2021-02-08 – 2021-02-11 (×4): 40 mg via SUBCUTANEOUS
  Filled 2021-02-07 (×4): qty 0.4

## 2021-02-07 MED ORDER — OXYCODONE HCL 5 MG PO TABS
5.0000 mg | ORAL_TABLET | Freq: Four times a day (QID) | ORAL | Status: DC | PRN
  Administered 2021-02-07 – 2021-02-11 (×8): 5 mg via ORAL
  Filled 2021-02-07 (×9): qty 1

## 2021-02-07 MED ORDER — MORPHINE SULFATE (PF) 2 MG/ML IV SOLN
2.0000 mg | INTRAVENOUS | Status: DC | PRN
  Administered 2021-02-07 – 2021-02-11 (×5): 2 mg via INTRAVENOUS
  Filled 2021-02-07 (×6): qty 1

## 2021-02-07 MED ORDER — SODIUM CHLORIDE 0.9 % IV SOLN: INTRAVENOUS | Status: AC

## 2021-02-07 MED ORDER — MIDAZOLAM HCL 2 MG/2ML IJ SOLN
INTRAMUSCULAR | Status: AC
  Filled 2021-02-07: qty 4

## 2021-02-07 MED ORDER — FENTANYL CITRATE (PF) 100 MCG/2ML IJ SOLN
INTRAMUSCULAR | Status: AC | PRN
  Administered 2021-02-07 (×2): 50 ug via INTRAVENOUS

## 2021-02-07 MED ORDER — MIDAZOLAM HCL 2 MG/2ML IJ SOLN
INTRAMUSCULAR | Status: AC | PRN
  Administered 2021-02-07: 1 mg via INTRAVENOUS
  Administered 2021-02-07: 2 mg via INTRAVENOUS

## 2021-02-07 MED ORDER — IOHEXOL 300 MG/ML  SOLN
50.0000 mL | Freq: Once | INTRAMUSCULAR | Status: AC | PRN
  Administered 2021-02-07: 5 mL

## 2021-02-07 MED ORDER — LIDOCAINE HCL 1 % IJ SOLN
INTRAMUSCULAR | Status: AC
  Filled 2021-02-07: qty 20

## 2021-02-07 NOTE — TOC Progression Note (Addendum)
Transition of Care [facility]) - Progression Note    Patient Details  Name: Vincent [name] MRN: [id number] Date of Birth: 1951/04/24  [facility] CM/SW Contact  Purcell Mouton, RN Phone Number: 02/07/2021, 11:54 AM  Clinical Narrative:    Wife called [employer], CM to insure that this CM have Palliative to see pt. A call to pt's wife was made. [name] asked for Palliative Care to see pt. There are no other needs at present time. MD was made aware.   Expected Discharge Plan: Home/Self Care Barriers to Discharge: No Barriers Identified  Expected Discharge Plan and Services Expected Discharge Plan: Home/Self Care       Living arrangements for the past 2 months: Single Family Home                                       Social Determinants of Health (SDOH) Interventions    Readmission Risk Interventions No flowsheet data found.

## 2021-02-07 NOTE — Plan of Care (Signed)
  Problem: Education: Goal: Knowledge of General Education information will improve Description: Including pain rating scale, medication(s)/side effects and non-pharmacologic comfort measures Outcome: Progressing   Problem: Clinical Measurements: Goal: Ability to maintain clinical measurements within normal limits will improve Outcome: Progressing Goal: Will remain free from infection Outcome: Progressing Goal: Diagnostic test results will improve Outcome: Progressing Goal: Respiratory complications will improve Outcome: Progressing   Problem: Activity: Goal: Risk for activity intolerance will decrease Outcome: Progressing   Problem: Nutrition: Goal: Adequate nutrition will be maintained Outcome: Progressing   Problem: Elimination: Goal: Will not experience complications related to bowel motility Outcome: Progressing Goal: Will not experience complications related to urinary retention Outcome: Progressing   Problem: Pain Managment: Goal: General experience of comfort will improve Outcome: Progressing   Problem: Safety: Goal: Ability to remain free from injury will improve Outcome: Progressing   Problem: Skin Integrity: Goal: Risk for impaired skin integrity will decrease Outcome: Progressing   

## 2021-02-07 NOTE — Progress Notes (Signed)
PROGRESS NOTE  Vincent [name] KMM:[id number] DOB: Aug 11, 1951 DOA: 02/05/2021 PCP: Ginger Organ., MD  HPI/Recap of past 24 hours: HPI from Dr Isabel Caprice is a 70 y.o. male from SNF, seen in ED, with complaints of AMS. No family at bedside. HPI per chart review as pt is encephalopathic. Pt is DNR and has most form. Pt was d/c from hospital on 01/12/2021 to SNF for AMS attributed to B12 def, and polypharmacy. During his last visit, due to fever/leukocytosis, HIDA scan was done which showed possible chronic cholecystitis.  General surgery was consulted, due to no symptoms, no further intervention was recommended. Pt has past medical history of Chronic cholecystitis, CHF,CAD s/p pci to lad, DM 2. In the ED, pt was noted to be confused, dyspneic. Pt met severe sepsis criteria as he was tachycardic, tachypneic, febrile, with leucocytosis, WBC 25.  Noted significant transaminitis, ALK phos 403, AST of 219, ALT 153, T bili 6.1, lactic acid 2.2, procalcitonin 4.07.  Patient was started on antibiotics and IV fluids.  Patient admitted for further management.     Today, patient denies any new complaints, denies any abdominal pain, nausea/vomiting, fever/chills, chest pain, shortness of breath.  Wife at bedside.     Assessment/Plan: Principal Problem:   Severe sepsis with septic shock (HCC) Active Problems:   Coronary atherosclerosis   Diabetes 1.5, managed as type 2 (HCC)   Chronic cholecystitis   Sacral decubitus ulcer   Severe sepsis with septic shock (CODE) (HCC)   Severe sepsis likely 2/2 possible acute on chronic cholecystitis Noted transaminitis On presentation, tachycardic, tachypneic, febrile, leucocytosis, LA 2.2 Currently afebrile, with resolved leukocytosis Lactic acid 2.2--> 1.3 s/p IVF Procalcitonin 4.07-->1.88, will trend BC x2 NGTD ALK phos 403, AST of 219, ALT 153, T bili 6.1, will trend Ultrasound shows gallbladder sludge, dilated CBD at 7.6 mm (previous 6.2  mm) GI consulted, MRCP done on 02/06/2021 showed no choledocholithiasis General surgery consulted, recommended for perc cholecystostomy by IR, scheduled for 02/07/21 Continue IV cefepime, metronidazole, d/c vancomycin Continue gentle IV fluids Monitor closely  Possible UTI UA showed moderate hemoglobin, moderate leukocytes, many bacteria, WBC greater than 50 UC with 50,000 E.coli Continue IV antibiotics as above  Acute metabolic encephalopathy Improved Likely 2/2 above Management as above  Sacral decubitus ulcer Bilateral heel ulcer Likely stage II-III decubitus ulcer WOC consulted, appreciate recs  Hypertension BP soft Continue gentle IV hydration  Combined chronic systolic and diastolic HF Appears somewhat dry Last echo showed EF around 50% Due to soft BP, sepsis, hold home Lasix, Lopressor  Diabetes mellitus type 2 Last A1c 7.2 Currently n.p.o., SSI, Accu-Cheks, hypoglycemic protocol  CAD s/p PCI to LAD History of subacute incidental CVA Hyperlipidemia Currently chest pain-free Hold aspirin, Imdur, statins, Lopressor for now  ?Marijuana abuse UDS showed marijuana Advised to quit  Obesity Lifestyle modification advised Estimated body mass index is 30.73 kg/m as calculated from the following:   Height as of this encounter: '5\' 8"'  (1.727 m).   Weight as of this encounter: 91.7 kg.     Code Status: Full  Family Communication: Discussed with wife at bedside  Disposition Plan: Status is: Inpatient  Remains inpatient appropriate because:Inpatient level of care appropriate due to severity of illness   Dispo: The patient is from: SNF              Anticipated d/c is to: SNF              Patient currently is  not medically stable to d/c.   Difficult to place patient No    Consultants:  GI  General surgery  IR  Procedures:  None  Antimicrobials:  Cefepime  Metronidazole  DVT prophylaxis: Lovenox   Objective: Vitals:   02/06/21 0658  02/06/21 1236 02/06/21 2103 02/07/21 0436  BP: (!) 101/53 96/71 112/68 106/71  Pulse: 78 79 94 93  Resp: '20 20 19 18  ' Temp: 97.6 F (36.4 C)  98.7 F (37.1 C) 98.2 F (36.8 C)  TempSrc: Oral  Oral Oral  SpO2: 100% 100% 98% 100%  Weight:    91.7 kg  Height:        Intake/Output Summary (Last 24 hours) at 02/07/2021 1249 Last data filed at 02/07/2021 0800 Gross per 24 hour  Intake --  Output 1600 ml  Net -1600 ml   Filed Weights   02/06/21 0636 02/07/21 0436  Weight: 90.6 kg 91.7 kg    Exam:  General: NAD, chronically ill-appearing  Cardiovascular: S1, S2 present  Respiratory: CTAB  Abdomen: Soft, mild generalized TTP, nondistended, bowel sounds present  Musculoskeletal: No bilateral pedal edema noted, noted bilateral heel wound with dressing  Skin:  Bilateral heel wound with dressing  Psychiatry:  Normal mood    Data Reviewed: CBC: Recent Labs  Lab 02/05/21 0016 02/06/21 0510 02/07/21 0350  WBC 25.1* 19.4* 10.3  NEUTROABS 21.4*  --  7.5  HGB 12.0* 10.3* 10.5*  HCT 35.4* 31.0* 33.0*  MCV 89.2 90.4 93.2  PLT 280 229 010   Basic Metabolic Panel: Recent Labs  Lab 02/05/21 0016 02/06/21 0510 02/07/21 0350  NA 137 136 136  K 3.5 3.5 3.4*  CL 101 103 105  CO2 20* 22 22  GLUCOSE 184* 214* 111*  BUN '13 14 10  ' CREATININE 0.79 0.63 0.56*  CALCIUM 8.9 8.7* 8.7*   GFR: Estimated Creatinine Clearance: 95.8 mL/min (A) (by C-G formula based on SCr of 0.56 mg/dL (L)). Liver Function Tests: Recent Labs  Lab 02/05/21 0016 02/06/21 0510 02/07/21 0350  AST 219* 172* 110*  ALT 153* 132* 104*  ALKPHOS 403* 341* 294*  BILITOT 6.2* 6.1* 3.0*  PROT 6.1* 5.5* 5.6*  ALBUMIN 2.7* 2.3* 2.4*   No results for input(s): LIPASE, AMYLASE in the last 168 hours. No results for input(s): AMMONIA in the last 168 hours. Coagulation Profile: Recent Labs  Lab 02/05/21 0016 02/06/21 0510 02/07/21 0350  INR 1.2 1.3* 1.2   Cardiac Enzymes: No results for input(s):  CKTOTAL, CKMB, CKMBINDEX, TROPONINI in the last 168 hours. BNP (last 3 results) No results for input(s): PROBNP in the last 8760 hours. HbA1C: No results for input(s): HGBA1C in the last 72 hours. CBG: Recent Labs  Lab 02/06/21 2100 02/07/21 0005 02/07/21 0428 02/07/21 0805 02/07/21 1116  GLUCAP 233* 96 111* 131* 132*   Lipid Profile: No results for input(s): CHOL, HDL, LDLCALC, TRIG, CHOLHDL, LDLDIRECT in the last 72 hours. Thyroid Function Tests: No results for input(s): TSH, T4TOTAL, FREET4, T3FREE, THYROIDAB in the last 72 hours. Anemia Panel: No results for input(s): VITAMINB12, FOLATE, FERRITIN, TIBC, IRON, RETICCTPCT in the last 72 hours. Urine analysis:    Component Value Date/Time   COLORURINE AMBER (A) 02/06/2021 0150   APPEARANCEUR HAZY (A) 02/06/2021 0150   LABSPEC 1.010 02/06/2021 0150   PHURINE 6.0 02/06/2021 0150   GLUCOSEU NEGATIVE 02/06/2021 0150   HGBUR MODERATE (A) 02/06/2021 0150   BILIRUBINUR MODERATE (A) 02/06/2021 0150   KETONESUR NEGATIVE 02/06/2021 0150   PROTEINUR NEGATIVE 02/06/2021  0150   UROBILINOGEN 0.2 01/06/2014 1257   NITRITE NEGATIVE 02/06/2021 0150   LEUKOCYTESUR MODERATE (A) 02/06/2021 0150   Sepsis Labs: '@LABRCNTIP' (procalcitonin:4,lacticidven:4)  ) Recent Results (from the past 240 hour(s))  Blood Culture (routine x 2)     Status: None (Preliminary result)   Collection Time: 02/06/21 12:21 AM   Specimen: BLOOD  Result Value Ref Range Status   Specimen Description   Final    BLOOD BLOOD RIGHT HAND Performed at [facility] [address], [place], [place] [address]    Special Requests   Final    BOTTLES DRAWN AEROBIC AND ANAEROBIC Blood Culture adequate volume Performed at [facility] [address], [place], [place] [address]    Culture   Final    NO GROWTH 1 DAY Performed at [facility], [place] [address], [place], [place] [address]    Report Status PENDING  Incomplete  Urine  culture     Status: Abnormal (Preliminary result)   Collection Time: 02/06/21  1:50 AM   Specimen: In/Out Cath Urine  Result Value Ref Range Status   Specimen Description   Final    IN/OUT CATH URINE Performed at [facility] [address] [place], [place] [address]    Special Requests   Final    NONE Performed at [facility], [place] [address], [place], [place] [address]    Culture (A)  Final    50,000 COLONIES/mL ESCHERICHIA COLI SUSCEPTIBILITIES TO FOLLOW Performed at [facility], [place] [address] [place],  [address]    Report Status PENDING  Incomplete  Resp Panel by RT-PCR (Flu A&B, Covid) Nasopharyngeal Swab     Status: None   Collection Time: 02/06/21  1:50 AM   Specimen: Nasopharyngeal Swab; Nasopharyngeal(NP) swabs in vial transport medium  Result Value Ref Range Status   SARS Coronavirus 2 by RT PCR NEGATIVE NEGATIVE Final    Comment: (NOTE) SARS-CoV-2 target nucleic acids are NOT DETECTED.  The SARS-CoV-2 RNA is generally detectable in upper respiratory specimens during the acute phase of infection. The lowest concentration of SARS-CoV-2 viral copies this assay can detect is 138 copies/mL. A negative result does not preclude SARS-Cov-2 infection and should not be used as the sole basis for treatment or other patient management decisions. A negative result may occur with  improper specimen collection/handling, submission of specimen other than nasopharyngeal swab, presence of viral mutation(s) within the areas targeted by this assay, and inadequate number of viral copies(<138 copies/mL). A negative result must be combined with clinical observations, patient history, and epidemiological information. The expected result is Negative.  Fact Sheet for Patients:  EntrepreneurPulse.com.au  Fact Sheet for Healthcare Providers:  IncredibleEmployment.be  This test is no t yet approved  or cleared by the [place] FDA and  has been authorized for detection and/or diagnosis of SARS-CoV-2 by FDA under an Emergency Use Authorization (EUA). This EUA will remain  in effect (meaning this test can be used) for the duration of the COVID-19 declaration under Section 564(b)(1) of the Act, 21 U.S.C.section 360bbb-3(b)(1), unless the authorization is terminated  or revoked sooner.       Influenza A by PCR NEGATIVE NEGATIVE Final   Influenza B by PCR NEGATIVE NEGATIVE Final    Comment: (NOTE) The Xpert Xpress SARS-CoV-2/FLU/RSV plus assay is intended as an aid in the diagnosis of influenza from Nasopharyngeal swab specimens and should not be used as a sole basis for treatment.  Nasal washings and aspirates are unacceptable for Xpert Xpress SARS-CoV-2/FLU/RSV testing.  Fact Sheet for Patients: EntrepreneurPulse.com.au  Fact Sheet for Healthcare Providers: IncredibleEmployment.be  This test is not yet approved or cleared by the [place] FDA and has been authorized for detection and/or diagnosis of SARS-CoV-2 by FDA under an Emergency Use Authorization (EUA). This EUA will remain in effect (meaning this test can be used) for the duration of the COVID-19 declaration under Section 564(b)(1) of the Act, 21 U.S.C. section 360bbb-3(b)(1), unless the authorization is terminated or revoked.  Performed at [facility], [place] [address] [place], [place] [address]   Blood Culture (routine x 2)     Status: None (Preliminary result)   Collection Time: 02/06/21  3:40 AM   Specimen: BLOOD  Result Value Ref Range Status   Specimen Description   Final    BLOOD LEFT ANTECUBITAL Performed at [facility] [address] [place], [place] [address]    Special Requests   Final    BOTTLES DRAWN AEROBIC AND ANAEROBIC Blood Culture adequate volume Performed at [facility] [address], [place], [place] [address]    Culture   Final    NO GROWTH 1 DAY Performed at [facility], [place] [address], [place], [place] [address]    Report Status PENDING  Incomplete      Studies: MR ABDOMEN MRCP WO CONTRAST  Result Date: 02/06/2021 CLINICAL DATA:  Jaundice. Gallbladder wall thickening and irregular gallbladder sludge on prior ultrasound. EXAM: MRI ABDOMEN WITHOUT CONTRAST  (INCLUDING MRCP) TECHNIQUE: Multiplanar multisequence MR imaging of the abdomen was performed. Heavily T2-weighted images of the biliary and pancreatic ducts were obtained, and three-dimensional MRCP images were rendered by post processing. COMPARISON:  CT 01/22/2021. Ultrasound 01/22/2021 and 02/06/2021. Nuclear medicine hepatobiliary scan 01/24/2021. FINDINGS: Despite efforts by the technologist and patient, mild motion artifact is present on today's exam and could not be eliminated. This reduces exam sensitivity and specificity. Lower chest: Trace bilateral pleural effusions with mild atelectasis at both lung bases. Hepatobiliary: No focal hepatic abnormalities are identified. The thin section MRCP images are motion degraded, but there is only mild extrahepatic biliary dilatation with the common hepatic duct measuring 8 mm maximally. No evidence of choledocholithiasis or intrahepatic biliary dilatation. Mild gallbladder wall thickening and irregular sludge within the gallbladder lumen are similar to the recent ultrasound. No discrete gallstones identified. Pancreas: Unremarkable. No pancreatic ductal dilatation or surrounding inflammatory changes. Spleen: Normal in size without focal abnormality. Adrenals/Urinary Tract: Both adrenal glands appear normal. There is a small cyst in the anterior interpolar region of the right kidney. The left kidney appears normal. No hydronephrosis. Stomach/Bowel: The stomach appears unremarkable for its degree of distension. No evidence of bowel wall thickening, distention or surrounding  inflammatory change.Scattered colonic diverticulosis. Vascular/Lymphatic: There are no enlarged abdominal lymph nodes. Aortic atherosclerosis without aneurysm. Other: Supraumbilical hernia containing only fat appears stable. No ascites. Musculoskeletal: No acute or significant osseous findings. Mild edema within the erector spinae musculature. IMPRESSION: 1. Minimal extrahepatic biliary dilatation without evidence of choledocholithiasis, pancreatic lesion or pancreatic ductal dilatation. 2. Persistent abnormal appearance of the gallbladder with wall thickening and heterogeneous sludge. As correlated with previous imaging, cholecystitis not excluded. 3. Scattered colonic diverticulosis without acute inflammation. 4. Stable supraumbilical hernia containing only fat. Electronically Signed   By: Richardean Sale M.D.   On: 02/06/2021 14:54   MR 3D Recon At Scanner  Result Date: 02/06/2021 CLINICAL DATA:  Jaundice.  Gallbladder wall thickening and irregular gallbladder sludge on prior ultrasound. EXAM: MRI ABDOMEN WITHOUT CONTRAST  (INCLUDING MRCP) TECHNIQUE: Multiplanar multisequence MR imaging of the abdomen was performed. Heavily T2-weighted images of the biliary and pancreatic ducts were obtained, and three-dimensional MRCP images were rendered by post processing. COMPARISON:  CT 01/22/2021. Ultrasound 01/22/2021 and 02/06/2021. Nuclear medicine hepatobiliary scan 01/24/2021. FINDINGS: Despite efforts by the technologist and patient, mild motion artifact is present on today's exam and could not be eliminated. This reduces exam sensitivity and specificity. Lower chest: Trace bilateral pleural effusions with mild atelectasis at both lung bases. Hepatobiliary: No focal hepatic abnormalities are identified. The thin section MRCP images are motion degraded, but there is only mild extrahepatic biliary dilatation with the common hepatic duct measuring 8 mm maximally. No evidence of choledocholithiasis or intrahepatic  biliary dilatation. Mild gallbladder wall thickening and irregular sludge within the gallbladder lumen are similar to the recent ultrasound. No discrete gallstones identified. Pancreas: Unremarkable. No pancreatic ductal dilatation or surrounding inflammatory changes. Spleen: Normal in size without focal abnormality. Adrenals/Urinary Tract: Both adrenal glands appear normal. There is a small cyst in the anterior interpolar region of the right kidney. The left kidney appears normal. No hydronephrosis. Stomach/Bowel: The stomach appears unremarkable for its degree of distension. No evidence of bowel wall thickening, distention or surrounding inflammatory change.Scattered colonic diverticulosis. Vascular/Lymphatic: There are no enlarged abdominal lymph nodes. Aortic atherosclerosis without aneurysm. Other: Supraumbilical hernia containing only fat appears stable. No ascites. Musculoskeletal: No acute or significant osseous findings. Mild edema within the erector spinae musculature. IMPRESSION: 1. Minimal extrahepatic biliary dilatation without evidence of choledocholithiasis, pancreatic lesion or pancreatic ductal dilatation. 2. Persistent abnormal appearance of the gallbladder with wall thickening and heterogeneous sludge. As correlated with previous imaging, cholecystitis not excluded. 3. Scattered colonic diverticulosis without acute inflammation. 4. Stable supraumbilical hernia containing only fat. Electronically Signed   By: Richardean Sale M.D.   On: 02/06/2021 14:54    Scheduled Meds: . enoxaparin (LOVENOX) injection  40 mg Subcutaneous Q24H  . insulin aspart  0-9 Units Subcutaneous Q4H  . pantoprazole (PROTONIX) IV  40 mg Intravenous Q12H    Continuous Infusions: . ceFEPime (MAXIPIME) IV 2 g (02/07/21 1203)  . metronidazole 500 mg (02/07/21 1237)     LOS: 1 day     Alma Friendly, MD [facility]  If 7PM-7AM, please contact night-coverage [email or web address] 02/07/2021, 12:49 PM

## 2021-02-07 NOTE — Progress Notes (Signed)
MEDICATION-RELATED CONSULT NOTE   IR Procedure Consult - Anticoagulant/Antiplatelet PTA/Inpatient Med List Review by Pharmacist    Procedure: cholecystostomy tube placement    Completed: 5/3 @ 7096  Post-Procedural bleeding risk per IR MD assessment: STANDARD  Antithrombotic medications on inpatient profile prior to procedure:  Enoxaparin 40 mg daily, ordered to start 5/3 at 2200 PTA antithrombotic medications:  ASA 81 (Hx CVA - documented 01/12/21, but notes refer to this as subacute, incidental finding)    Recommended restart time per IR Post-Procedure Guidelines:  Post-procedural day 1 AM   Other considerations:  ASA currently on hold with other PO meds d/t AMS   Plan:      Will move enoxaparin 40 mg SQ daily to start tomorrow AM  Continue to hold ASA as needed based on mental status; would resume as soon as able  Reuel Boom, PharmD, BCPS [phone] 02/07/2021, 4:49 PM

## 2021-02-07 NOTE — Progress Notes (Signed)
Subjective: CC: Patient A&Ox3 this am. Reports no abdominal pain, n/v. IR planning perc chole today.   Objective: Vital signs in last 24 hours: Temp:  [98.2 F (36.8 C)-98.7 F (37.1 C)] 98.2 F (36.8 C) (05/03 0436) Pulse Rate:  [79-94] 93 (05/03 0436) Resp:  [18-20] 18 (05/03 0436) BP: (96-112)/(68-71) 106/71 (05/03 0436) SpO2:  [98 %-100 %] 100 % (05/03 0436) Weight:  [91.7 kg] 91.7 kg (05/03 0436) Last BM Date: 02/06/21  Intake/Output from previous day: 05/02 0701 - 05/03 0700 In: -  Out: 1000 [Urine:1000] Intake/Output this shift: Total I/O In: -  Out: 600 [Urine:600]  PE: Gen:  Alert, NAD, pleasant Card:  Reg Pulm:  Normal rate and effort  Abd: Soft, ND, mild tenderness of the RUQ, +BS Psych: A&Ox3  Skin: no rashes noted, warm and dry   Lab Results:  Recent Labs    02/06/21 0510 02/07/21 0350  WBC 19.4* 10.3  HGB 10.3* 10.5*  HCT 31.0* 33.0*  PLT 229 253   BMET Recent Labs    02/06/21 0510 02/07/21 0350  NA 136 136  K 3.5 3.4*  CL 103 105  CO2 22 22  GLUCOSE 214* 111*  BUN 14 10  CREATININE 0.63 0.56*  CALCIUM 8.7* 8.7*   PT/INR Recent Labs    02/06/21 0510 02/07/21 0350  LABPROT 15.9* 14.9  INR 1.3* 1.2   CMP     Component Value Date/Time   NA 136 02/07/2021 0350   K 3.4 (L) 02/07/2021 0350   CL 105 02/07/2021 0350   CO2 22 02/07/2021 0350   GLUCOSE 111 (H) 02/07/2021 0350   BUN 10 02/07/2021 0350   CREATININE 0.56 (L) 02/07/2021 0350   CALCIUM 8.7 (L) 02/07/2021 0350   PROT 5.6 (L) 02/07/2021 0350   ALBUMIN 2.4 (L) 02/07/2021 0350   AST 110 (H) 02/07/2021 0350   ALT 104 (H) 02/07/2021 0350   ALKPHOS 294 (H) 02/07/2021 0350   BILITOT 3.0 (H) 02/07/2021 0350   GFRNONAA >60 02/07/2021 0350   GFRAA >60 08/10/2016 0417   Lipase     Component Value Date/Time   LIPASE 65 (H) 01/06/2014 1200       Studies/Results: MR ABDOMEN MRCP WO CONTRAST  Result Date: 02/06/2021 CLINICAL DATA:  Jaundice. Gallbladder wall  thickening and irregular gallbladder sludge on prior ultrasound. EXAM: MRI ABDOMEN WITHOUT CONTRAST  (INCLUDING MRCP) TECHNIQUE: Multiplanar multisequence MR imaging of the abdomen was performed. Heavily T2-weighted images of the biliary and pancreatic ducts were obtained, and three-dimensional MRCP images were rendered by post processing. COMPARISON:  CT 01/22/2021. Ultrasound 01/22/2021 and 02/06/2021. Nuclear medicine hepatobiliary scan 01/24/2021. FINDINGS: Despite efforts by the technologist and patient, mild motion artifact is present on today's exam and could not be eliminated. This reduces exam sensitivity and specificity. Lower chest: Trace bilateral pleural effusions with mild atelectasis at both lung bases. Hepatobiliary: No focal hepatic abnormalities are identified. The thin section MRCP images are motion degraded, but there is only mild extrahepatic biliary dilatation with the common hepatic duct measuring 8 mm maximally. No evidence of choledocholithiasis or intrahepatic biliary dilatation. Mild gallbladder wall thickening and irregular sludge within the gallbladder lumen are similar to the recent ultrasound. No discrete gallstones identified. Pancreas: Unremarkable. No pancreatic ductal dilatation or surrounding inflammatory changes. Spleen: Normal in size without focal abnormality. Adrenals/Urinary Tract: Both adrenal glands appear normal. There is a small cyst in the anterior interpolar region of the right kidney. The left kidney appears normal.  No hydronephrosis. Stomach/Bowel: The stomach appears unremarkable for its degree of distension. No evidence of bowel wall thickening, distention or surrounding inflammatory change.Scattered colonic diverticulosis. Vascular/Lymphatic: There are no enlarged abdominal lymph nodes. Aortic atherosclerosis without aneurysm. Other: Supraumbilical hernia containing only fat appears stable. No ascites. Musculoskeletal: No acute or significant osseous findings. Mild  edema within the erector spinae musculature. IMPRESSION: 1. Minimal extrahepatic biliary dilatation without evidence of choledocholithiasis, pancreatic lesion or pancreatic ductal dilatation. 2. Persistent abnormal appearance of the gallbladder with wall thickening and heterogeneous sludge. As correlated with previous imaging, cholecystitis not excluded. 3. Scattered colonic diverticulosis without acute inflammation. 4. Stable supraumbilical hernia containing only fat. Electronically Signed   By: Richardean Sale M.D.   On: 02/06/2021 14:54   MR 3D Recon At Scanner  Result Date: 02/06/2021 CLINICAL DATA:  Jaundice. Gallbladder wall thickening and irregular gallbladder sludge on prior ultrasound. EXAM: MRI ABDOMEN WITHOUT CONTRAST  (INCLUDING MRCP) TECHNIQUE: Multiplanar multisequence MR imaging of the abdomen was performed. Heavily T2-weighted images of the biliary and pancreatic ducts were obtained, and three-dimensional MRCP images were rendered by post processing. COMPARISON:  CT 01/22/2021. Ultrasound 01/22/2021 and 02/06/2021. Nuclear medicine hepatobiliary scan 01/24/2021. FINDINGS: Despite efforts by the technologist and patient, mild motion artifact is present on today's exam and could not be eliminated. This reduces exam sensitivity and specificity. Lower chest: Trace bilateral pleural effusions with mild atelectasis at both lung bases. Hepatobiliary: No focal hepatic abnormalities are identified. The thin section MRCP images are motion degraded, but there is only mild extrahepatic biliary dilatation with the common hepatic duct measuring 8 mm maximally. No evidence of choledocholithiasis or intrahepatic biliary dilatation. Mild gallbladder wall thickening and irregular sludge within the gallbladder lumen are similar to the recent ultrasound. No discrete gallstones identified. Pancreas: Unremarkable. No pancreatic ductal dilatation or surrounding inflammatory changes. Spleen: Normal in size without focal  abnormality. Adrenals/Urinary Tract: Both adrenal glands appear normal. There is a small cyst in the anterior interpolar region of the right kidney. The left kidney appears normal. No hydronephrosis. Stomach/Bowel: The stomach appears unremarkable for its degree of distension. No evidence of bowel wall thickening, distention or surrounding inflammatory change.Scattered colonic diverticulosis. Vascular/Lymphatic: There are no enlarged abdominal lymph nodes. Aortic atherosclerosis without aneurysm. Other: Supraumbilical hernia containing only fat appears stable. No ascites. Musculoskeletal: No acute or significant osseous findings. Mild edema within the erector spinae musculature. IMPRESSION: 1. Minimal extrahepatic biliary dilatation without evidence of choledocholithiasis, pancreatic lesion or pancreatic ductal dilatation. 2. Persistent abnormal appearance of the gallbladder with wall thickening and heterogeneous sludge. As correlated with previous imaging, cholecystitis not excluded. 3. Scattered colonic diverticulosis without acute inflammation. 4. Stable supraumbilical hernia containing only fat. Electronically Signed   By: Richardean Sale M.D.   On: 02/06/2021 14:54   US Abdomen Limited  Result Date: 02/06/2021 CLINICAL DATA:  AST abnormality. EXAM: ULTRASOUND ABDOMEN LIMITED RIGHT UPPER QUADRANT COMPARISON:  January 22, 2021 FINDINGS: Gallbladder: A large amount of heterogeneous sludge is seen within the gallbladder lumen. No gallstones are identified. The gallbladder wall measures 5.2 mm in thickness. No sonographic Murphy sign noted by sonographer. Common bile duct: Diameter: 7.6 mm Liver: No focal lesion identified. Diffusely increased echogenicity of the liver parenchyma is seen. Portal vein is patent on color Doppler imaging with normal direction of blood flow towards the liver. Other: None. IMPRESSION: 1. Gallbladder sludge and gallbladder wall thickening without evidence of cholelithiasis. While this  may represent sequelae associated with chronic cholecystitis, further evaluation with a nuclear medicine  hepatobiliary scan is recommended. 2. Fatty liver. Electronically Signed   By: Virgina Norfolk M.D.   On: 02/06/2021 02:43   DG Chest Port 1 View  Result Date: 02/06/2021 CLINICAL DATA:  Slurred speech. EXAM: PORTABLE CHEST 1 VIEW COMPARISON:  January 21, 2021 FINDINGS: Decreased lung volumes are seen which is likely secondary to the degree of patient inspiration. Very mild left basilar atelectasis is noted. There is no evidence of a pleural effusion or pneumothorax. The heart size and mediastinal contours are within normal limits. Mild calcification of the aortic arch is noted. The visualized skeletal structures are unremarkable. IMPRESSION: Very mild left basilar atelectasis. Electronically Signed   By: Virgina Norfolk M.D.   On: 02/06/2021 01:14    Anti-infectives: Anti-infectives (From admission, onward)   Start     Dose/Rate Route Frequency Ordered Stop   02/07/21 0100  vancomycin (VANCOREADY) IVPB 2000 mg/400 mL        2,000 mg 200 mL/hr over 120 Minutes Intravenous Every 24 hours 02/06/21 0316     02/06/21 2000  ceFEPIme (MAXIPIME) 2 g in sodium chloride 0.9 % 100 mL IVPB        2 g 200 mL/hr over 30 Minutes Intravenous Every 8 hours 02/06/21 1202     02/06/21 1000  aztreonam (AZACTAM) 1 g in sodium chloride 0.9 % 100 mL IVPB  Status:  Discontinued        1 g 200 mL/hr over 30 Minutes Intravenous Every 8 hours 02/06/21 0248 02/06/21 1202   02/06/21 0400  metroNIDAZOLE (FLAGYL) IVPB 500 mg        500 mg 100 mL/hr over 60 Minutes Intravenous Every 8 hours 02/06/21 0248     02/06/21 0000  vancomycin (VANCOREADY) IVPB 2000 mg/400 mL        2,000 mg 200 mL/hr over 120 Minutes Intravenous  Once 02/05/21 2348 02/06/21 0511   02/05/21 2345  aztreonam (AZACTAM) 2 g in sodium chloride 0.9 % 100 mL IVPB        2 g 200 mL/hr over 30 Minutes Intravenous  Once 02/05/21 2336 02/06/21 0101    02/05/21 2345  metroNIDAZOLE (FLAGYL) IVPB 500 mg  Status:  Discontinued        500 mg 100 mL/hr over 60 Minutes Intravenous  Once 02/05/21 2336 02/06/21 0256   02/05/21 2345  vancomycin (VANCOCIN) IVPB 1000 mg/200 mL premix  Status:  Discontinued        1,000 mg 200 mL/hr over 60 Minutes Intravenous  Once 02/05/21 2336 02/05/21 2348       Assessment/Plan Chronic systolic and diastolic heart failure CAD-s/p PCI to LAD in 2007 HTN HLD DM Obesity Chronic pain Recent hospitalization 4/7-4/22/2022 for AMS which was attributed to B12 deficiency, incidental small right parietal stroke and polypharmacy  Acute on Chronic Cholecystitis Elevated LFT's - MRCP negative for choledocholithiasis. Could have passed a stone. LFT's downtrending. T. Bili 6.1 > 3.0 - With recent hx of CVA and increased risk of general anesthesia would recommend cholecystostomy tube for suspected acute on chronic cholecystitis.  - Discussed with family and patient that he will likely need cholecystostomy tube for at least 6 weeks and will need cardiac clearance before interval cholecystectomy  - Cont IV abx  - We will follow with you  FEN - NPO for IR procedure VTE - SCDs, Lovenox  ID - Maxipime, Flagyl, Vanc. WBC 10.3   LOS: 1 day    Jillyn Ledger , [facility] 02/07/2021, 9:07 AM  Please see Amion for pager number during day hours 7:00am-4:30pm

## 2021-02-07 NOTE — Plan of Care (Signed)
  Problem: Education: Goal: Knowledge of General Education information will improve Description: Including pain rating scale, medication(s)/side effects and non-pharmacologic comfort measures Outcome: Progressing   Problem: Clinical Measurements: Goal: Ability to maintain clinical measurements within normal limits will improve Outcome: Progressing Goal: Will remain free from infection Outcome: Progressing Goal: Diagnostic test results will improve Outcome: Progressing Goal: Respiratory complications will improve Outcome: Progressing   Problem: Activity: Goal: Risk for activity intolerance will decrease Outcome: Progressing   Problem: Nutrition: Goal: Adequate nutrition will be maintained Outcome: Progressing

## 2021-02-07 NOTE — Procedures (Signed)
Pre procedural Dx: Acute cholecysitis Post procedural Dx: Same  Technically successful US and Fluoro guided placement of a 10 Fr drainage catheter placement into the gallbladder lumen. Chole tube connected to gravity bag.  EBL: None  Complications: None immediate  Jay Virginia Francisco, MD Pager #: [phone]   

## 2021-02-07 NOTE — Progress Notes (Signed)
Patient taken to IR via stretcher. Oxygen and Tele on patient . IV fluids, normal saline infusing @50ml  per order.

## 2021-02-08 DIAGNOSIS — R6521 Severe sepsis with septic shock: Secondary | ICD-10-CM

## 2021-02-08 DIAGNOSIS — A419 Sepsis, unspecified organism: Principal | ICD-10-CM

## 2021-02-08 LAB — PROTIME-INR
INR: 1.1 (ref 0.8–1.2)
Prothrombin Time: 14.7 seconds (ref 11.4–15.2)

## 2021-02-08 LAB — URINE CULTURE: Culture: 50000 — AB

## 2021-02-08 LAB — COMPREHENSIVE METABOLIC PANEL
ALT: 76 U/L — ABNORMAL HIGH (ref 0–44)
AST: 46 U/L — ABNORMAL HIGH (ref 15–41)
Albumin: 2.4 g/dL — ABNORMAL LOW (ref 3.5–5.0)
Alkaline Phosphatase: 256 U/L — ABNORMAL HIGH (ref 38–126)
Anion gap: 9 (ref 5–15)
BUN: 7 mg/dL — ABNORMAL LOW (ref 8–23)
CO2: 21 mmol/L — ABNORMAL LOW (ref 22–32)
Calcium: 8.8 mg/dL — ABNORMAL LOW (ref 8.9–10.3)
Chloride: 106 mmol/L (ref 98–111)
Creatinine, Ser: 0.42 mg/dL — ABNORMAL LOW (ref 0.61–1.24)
GFR, Estimated: >60 mL/min (ref >60)
Glucose, Bld: 129 mg/dL — ABNORMAL HIGH (ref 70–99)
Potassium: 3.3 mmol/L — ABNORMAL LOW (ref 3.5–5.1)
Sodium: 136 mmol/L (ref 135–145)
Total Bilirubin: 2 mg/dL — ABNORMAL HIGH (ref 0.3–1.2)
Total Protein: 5.6 g/dL — ABNORMAL LOW (ref 6.5–8.1)

## 2021-02-08 LAB — GLUCOSE, CAPILLARY
Glucose-Capillary: 101 mg/dL — ABNORMAL HIGH (ref 70–99)
Glucose-Capillary: 133 mg/dL — ABNORMAL HIGH (ref 70–99)
Glucose-Capillary: 145 mg/dL — ABNORMAL HIGH (ref 70–99)
Glucose-Capillary: 150 mg/dL — ABNORMAL HIGH (ref 70–99)
Glucose-Capillary: 152 mg/dL — ABNORMAL HIGH (ref 70–99)
Glucose-Capillary: 160 mg/dL — ABNORMAL HIGH (ref 70–99)

## 2021-02-08 LAB — CBC WITH DIFFERENTIAL/PLATELET
Abs Immature Granulocytes: 0.08 10*3/uL — ABNORMAL HIGH (ref 0.00–0.07)
Basophils Absolute: 0 10*3/uL (ref 0.0–0.1)
Basophils Relative: 0 %
Eosinophils Absolute: 0 10*3/uL (ref 0.0–0.5)
Eosinophils Relative: 0 %
HCT: 33.4 % — ABNORMAL LOW (ref 39.0–52.0)
Hemoglobin: 11 g/dL — ABNORMAL LOW (ref 13.0–17.0)
Immature Granulocytes: 1 %
Lymphocytes Relative: 14 %
Lymphs Abs: 1.5 10*3/uL (ref 0.7–4.0)
MCH: 30 pg (ref 26.0–34.0)
MCHC: 32.9 g/dL (ref 30.0–36.0)
MCV: 91 fL (ref 80.0–100.0)
Monocytes Absolute: 0.6 10*3/uL (ref 0.1–1.0)
Monocytes Relative: 5 %
Neutro Abs: 8.8 10*3/uL — ABNORMAL HIGH (ref 1.7–7.7)
Neutrophils Relative %: 80 %
Platelets: 284 10*3/uL (ref 150–400)
RBC: 3.67 MIL/uL — ABNORMAL LOW (ref 4.22–5.81)
RDW: 14.4 % (ref 11.5–15.5)
WBC: 11 10*3/uL — ABNORMAL HIGH (ref 4.0–10.5)
nRBC: 0 % (ref 0.0–0.2)

## 2021-02-08 LAB — PROCALCITONIN: Procalcitonin: 0.91 ng/mL

## 2021-02-08 MED ORDER — POTASSIUM CHLORIDE CRYS ER 20 MEQ PO TBCR
40.0000 meq | EXTENDED_RELEASE_TABLET | ORAL | Status: AC
  Administered 2021-02-08 (×2): 40 meq via ORAL
  Filled 2021-02-08 (×2): qty 2

## 2021-02-08 NOTE — Plan of Care (Signed)
  Problem: Education: Goal: Knowledge of General Education information will improve Description: Including pain rating scale, medication(s)/side effects and non-pharmacologic comfort measures Outcome: Progressing   Problem: Clinical Measurements: Goal: Ability to maintain clinical measurements within normal limits will improve Outcome: Progressing Goal: Will remain free from infection Outcome: Progressing   

## 2021-02-08 NOTE — Plan of Care (Signed)
  Problem: Education: Goal: Knowledge of General Education information will improve Description: Including pain rating scale, medication(s)/side effects and non-pharmacologic comfort measures Outcome: Progressing   Problem: Clinical Measurements: Goal: Ability to maintain clinical measurements within normal limits will improve Outcome: Progressing Goal: Will remain free from infection Outcome: Progressing Goal: Diagnostic test results will improve Outcome: Progressing Goal: Respiratory complications will improve Outcome: Progressing   Problem: Activity: Goal: Risk for activity intolerance will decrease Outcome: Progressing   Problem: Nutrition: Goal: Adequate nutrition will be maintained Outcome: Progressing

## 2021-02-08 NOTE — TOC Progression Note (Signed)
Transition of Care [facility]) - Progression Note    Patient Details  Name: Vincent [name] MRN: [id number] Date of Birth: 1950-10-31  Transition of Care [facility]) CM/SW Contact  Purcell Mouton, RN Phone Number: 02/08/2021, 9:24 AM  Clinical Narrative:     Pt was at [facility]. A call to his wife revealed that she is not sure if pt will go back or come home. Will need PT eval and insurance authorization if pt goes to SNF.   Expected Discharge Plan: Home/Self Care Barriers to Discharge: No Barriers Identified  Expected Discharge Plan and Services Expected Discharge Plan: Home/Self Care       Living arrangements for the past 2 months: Single Family Home                                       Social Determinants of Health (SDOH) Interventions    Readmission Risk Interventions No flowsheet data found.

## 2021-02-08 NOTE — Progress Notes (Addendum)
Subjective: CC: Patient reports soreness around his RUQ where IR perc chole drain was placed. No n/v. Nurse reports BM this am.   Objective: Vital signs in last 24 hours: Temp:  [97.7 F (36.5 C)-98.2 F (36.8 C)] 98 F (36.7 C) (05/04 0332) Pulse Rate:  [86-106] 106 (05/04 0332) Resp:  [18-26] 18 (05/04 0332) BP: (114-135)/(59-75) 115/68 (05/04 0332) SpO2:  [95 %-100 %] 95 % (05/04 0332) Weight:  [105.1 kg] 105.1 kg (05/04 0500) Last BM Date: 02/06/21  Intake/Output from previous day: 05/03 0701 - 05/04 0700 In: 2661.9 [P.O.:360; I.V.:1096.7; IV Piggyback:1195.2] Out: 2175 [Urine:1975; Drains:200] Intake/Output this shift: Total I/O In: -  Out: 400 [Urine:400]  PE: Gen:  Alert, NAD, pleasant Card:  Reg Pulm:  Normal rate and effort  Abd: Soft, ND, mild tenderness of the RUQ around IR drain that appears appropriate and is without peritonitis, supraumbilical hernia that is soft and NT, +BS. IR drain in RUQ with bilious output.  Psych: A&Ox3  Skin: no rashes noted, warm and dry  Lab Results:  Recent Labs    02/07/21 0350 02/08/21 0326  WBC 10.3 11.0*  HGB 10.5* 11.0*  HCT 33.0* 33.4*  PLT 253 284   BMET Recent Labs    02/07/21 0350 02/08/21 0326  NA 136 136  K 3.4* 3.3*  CL 105 106  CO2 22 21*  GLUCOSE 111* 129*  BUN 10 7*  CREATININE 0.56* 0.42*  CALCIUM 8.7* 8.8*   PT/INR Recent Labs    02/07/21 0350 02/08/21 0326  LABPROT 14.9 14.7  INR 1.2 1.1   CMP     Component Value Date/Time   NA 136 02/08/2021 0326   K 3.3 (L) 02/08/2021 0326   CL 106 02/08/2021 0326   CO2 21 (L) 02/08/2021 0326   GLUCOSE 129 (H) 02/08/2021 0326   BUN 7 (L) 02/08/2021 0326   CREATININE 0.42 (L) 02/08/2021 0326   CALCIUM 8.8 (L) 02/08/2021 0326   PROT 5.6 (L) 02/08/2021 0326   ALBUMIN 2.4 (L) 02/08/2021 0326   AST 46 (H) 02/08/2021 0326   ALT 76 (H) 02/08/2021 0326   ALKPHOS 256 (H) 02/08/2021 0326   BILITOT 2.0 (H) 02/08/2021 0326   GFRNONAA >60  02/08/2021 0326   GFRAA >60 08/10/2016 0417   Lipase     Component Value Date/Time   LIPASE 65 (H) 01/06/2014 1200       Studies/Results: MR ABDOMEN MRCP WO CONTRAST  Result Date: 02/06/2021 CLINICAL DATA:  Jaundice. Gallbladder wall thickening and irregular gallbladder sludge on prior ultrasound. EXAM: MRI ABDOMEN WITHOUT CONTRAST  (INCLUDING MRCP) TECHNIQUE: Multiplanar multisequence MR imaging of the abdomen was performed. Heavily T2-weighted images of the biliary and pancreatic ducts were obtained, and three-dimensional MRCP images were rendered by post processing. COMPARISON:  CT 01/22/2021. Ultrasound 01/22/2021 and 02/06/2021. Nuclear medicine hepatobiliary scan 01/24/2021. FINDINGS: Despite efforts by the technologist and patient, mild motion artifact is present on today's exam and could not be eliminated. This reduces exam sensitivity and specificity. Lower chest: Trace bilateral pleural effusions with mild atelectasis at both lung bases. Hepatobiliary: No focal hepatic abnormalities are identified. The thin section MRCP images are motion degraded, but there is only mild extrahepatic biliary dilatation with the common hepatic duct measuring 8 mm maximally. No evidence of choledocholithiasis or intrahepatic biliary dilatation. Mild gallbladder wall thickening and irregular sludge within the gallbladder lumen are similar to the recent ultrasound. No discrete gallstones identified. Pancreas: Unremarkable. No pancreatic ductal dilatation or  surrounding inflammatory changes. Spleen: Normal in size without focal abnormality. Adrenals/Urinary Tract: Both adrenal glands appear normal. There is a small cyst in the anterior interpolar region of the right kidney. The left kidney appears normal. No hydronephrosis. Stomach/Bowel: The stomach appears unremarkable for its degree of distension. No evidence of bowel wall thickening, distention or surrounding inflammatory change.Scattered colonic  diverticulosis. Vascular/Lymphatic: There are no enlarged abdominal lymph nodes. Aortic atherosclerosis without aneurysm. Other: Supraumbilical hernia containing only fat appears stable. No ascites. Musculoskeletal: No acute or significant osseous findings. Mild edema within the erector spinae musculature. IMPRESSION: 1. Minimal extrahepatic biliary dilatation without evidence of choledocholithiasis, pancreatic lesion or pancreatic ductal dilatation. 2. Persistent abnormal appearance of the gallbladder with wall thickening and heterogeneous sludge. As correlated with previous imaging, cholecystitis not excluded. 3. Scattered colonic diverticulosis without acute inflammation. 4. Stable supraumbilical hernia containing only fat. Electronically Signed   By: Richardean Sale M.D.   On: 02/06/2021 14:54   MR 3D Recon At Scanner  Result Date: 02/06/2021 CLINICAL DATA:  Jaundice. Gallbladder wall thickening and irregular gallbladder sludge on prior ultrasound. EXAM: MRI ABDOMEN WITHOUT CONTRAST  (INCLUDING MRCP) TECHNIQUE: Multiplanar multisequence MR imaging of the abdomen was performed. Heavily T2-weighted images of the biliary and pancreatic ducts were obtained, and three-dimensional MRCP images were rendered by post processing. COMPARISON:  CT 01/22/2021. Ultrasound 01/22/2021 and 02/06/2021. Nuclear medicine hepatobiliary scan 01/24/2021. FINDINGS: Despite efforts by the technologist and patient, mild motion artifact is present on today's exam and could not be eliminated. This reduces exam sensitivity and specificity. Lower chest: Trace bilateral pleural effusions with mild atelectasis at both lung bases. Hepatobiliary: No focal hepatic abnormalities are identified. The thin section MRCP images are motion degraded, but there is only mild extrahepatic biliary dilatation with the common hepatic duct measuring 8 mm maximally. No evidence of choledocholithiasis or intrahepatic biliary dilatation. Mild gallbladder wall  thickening and irregular sludge within the gallbladder lumen are similar to the recent ultrasound. No discrete gallstones identified. Pancreas: Unremarkable. No pancreatic ductal dilatation or surrounding inflammatory changes. Spleen: Normal in size without focal abnormality. Adrenals/Urinary Tract: Both adrenal glands appear normal. There is a small cyst in the anterior interpolar region of the right kidney. The left kidney appears normal. No hydronephrosis. Stomach/Bowel: The stomach appears unremarkable for its degree of distension. No evidence of bowel wall thickening, distention or surrounding inflammatory change.Scattered colonic diverticulosis. Vascular/Lymphatic: There are no enlarged abdominal lymph nodes. Aortic atherosclerosis without aneurysm. Other: Supraumbilical hernia containing only fat appears stable. No ascites. Musculoskeletal: No acute or significant osseous findings. Mild edema within the erector spinae musculature. IMPRESSION: 1. Minimal extrahepatic biliary dilatation without evidence of choledocholithiasis, pancreatic lesion or pancreatic ductal dilatation. 2. Persistent abnormal appearance of the gallbladder with wall thickening and heterogeneous sludge. As correlated with previous imaging, cholecystitis not excluded. 3. Scattered colonic diverticulosis without acute inflammation. 4. Stable supraumbilical hernia containing only fat. Electronically Signed   By: Richardean Sale M.D.   On: 02/06/2021 14:54   IR Perc Cholecystostomy  Result Date: 02/07/2021 INDICATION: Concern for acute cholecystitis. Poor operative candidate. Please perform image guided cholecystostomy tube placement for infection source control purposes. EXAM: ULTRASOUND AND FLUOROSCOPIC-GUIDED CHOLECYSTOSTOMY TUBE PLACEMENT COMPARISON:  MRCP-02/06/2021; right upper quadrant abdominal ultrasound-02/06/2021; nuclear medicine HIDA scan-01/24/2021; CT the chest, abdomen and pelvis-01/22/2021 MEDICATIONS: The patient is  currently admitted to the hospital and on intravenous antibiotics. Antibiotics were administered within an appropriate time frame prior to skin puncture. ANESTHESIA/SEDATION: Moderate (conscious) sedation was employed during this procedure. Fentanyl  100 mcg was administered intravenously. Moderate Sedation Time: 14 minutes. The patient's level of consciousness and vital signs were monitored continuously by radiology nursing throughout the procedure under my direct supervision. CONTRAST:  43mL OMNIPAQUE IOHEXOL 300 MG/ML SOLN - administered into the gallbladder fossa. FLUOROSCOPY TIME:  1 minute, 36 seconds (92 mGy) COMPLICATIONS: None immediate. PROCEDURE: Informed written consent was obtained from the patient and the patient's family after a discussion of the risks, benefits and alternatives to treatment. Questions regarding the procedure were encouraged and answered. A timeout was performed prior to the initiation of the procedure. The right upper abdominal quadrant was prepped and draped in the usual sterile fashion, and a sterile drape was applied covering the operative field. Maximum barrier sterile technique with sterile gowns and gloves were used for the procedure. A timeout was performed prior to the initiation of the procedure. Local anesthesia was provided with 1% lidocaine with epinephrine. Ultrasound scanning of the right upper quadrant demonstrates a mildly dilated gallbladder with minimal amount of pericholecystic fluid. Utilizing a transhepatic approach, a 22 gauge needle was advanced into the gallbladder under direct ultrasound guidance. An ultrasound image was saved for documentation purposes. Appropriate intraluminal puncture was confirmed with the efflux of bile and advancement of an 0.018 wire into the gallbladder lumen. The needle was exchanged for an Seadrift set. A small amount of contrast was injected to confirm appropriate intraluminal positioning. Over a Benson wire, a 61.2-French Cook  cholecystomy tube was advanced into the gallbladder fossa, coiled and locked. Bile was aspirated and a small amount of contrast was injected as several post procedural spot radiographic images were obtained in various obliquities demonstrating passage of contrast through the cystic and common bile ducts to the level of the duodenum. The catheter was secured to the skin with suture, connected to a drainage bag and a dressing was placed. The patient tolerated the procedure well without immediate post procedural complication. IMPRESSION: 1. Successful ultrasound and fluoroscopic guided placement of a 10.2 French cholecystostomy tube. 2. Limited cholangiogram performed following cholecystostomy tube placement demonstrates patency of the cystic and common bile ducts with passage of contrast to the level of duodenum. Electronically Signed   By: Sandi Mariscal M.D.   On: 02/07/2021 16:17    Anti-infectives: Anti-infectives (From admission, onward)   Start     Dose/Rate Route Frequency Ordered Stop   02/07/21 0100  vancomycin (VANCOREADY) IVPB 2000 mg/400 mL  Status:  Discontinued        2,000 mg 200 mL/hr over 120 Minutes Intravenous Every 24 hours 02/06/21 0316 02/07/21 1154   02/06/21 2000  ceFEPIme (MAXIPIME) 2 g in sodium chloride 0.9 % 100 mL IVPB        2 g 200 mL/hr over 30 Minutes Intravenous Every 8 hours 02/06/21 1202     02/06/21 1000  aztreonam (AZACTAM) 1 g in sodium chloride 0.9 % 100 mL IVPB  Status:  Discontinued        1 g 200 mL/hr over 30 Minutes Intravenous Every 8 hours 02/06/21 0248 02/06/21 1202   02/06/21 0400  metroNIDAZOLE (FLAGYL) IVPB 500 mg        500 mg 100 mL/hr over 60 Minutes Intravenous Every 8 hours 02/06/21 0248     02/06/21 0000  vancomycin (VANCOREADY) IVPB 2000 mg/400 mL        2,000 mg 200 mL/hr over 120 Minutes Intravenous  Once 02/05/21 2348 02/06/21 0511   02/05/21 2345  aztreonam (AZACTAM) 2 g in sodium chloride 0.9 %  100 mL IVPB        2 g 200 mL/hr over 30  Minutes Intravenous  Once 02/05/21 2336 02/06/21 0101   02/05/21 2345  metroNIDAZOLE (FLAGYL) IVPB 500 mg  Status:  Discontinued        500 mg 100 mL/hr over 60 Minutes Intravenous  Once 02/05/21 2336 02/06/21 0256   02/05/21 2345  vancomycin (VANCOCIN) IVPB 1000 mg/200 mL premix  Status:  Discontinued        1,000 mg 200 mL/hr over 60 Minutes Intravenous  Once 02/05/21 2336 02/05/21 2348       Assessment/Plan Chronic systolic and diastolic heart failure CAD-s/p PCI to LAD in 2007 HTN HLD DM Obesity Chronic pain Supraumbilical hernia - contains only fat on imaging Recenthospitalization 4/7-4/22/20102for AMSwhich was attributed to B12 deficiency, incidental small right parietal strokeand polypharmacy  Acute on Chronic Cholecystitis Elevated LFT's - MRCP negative for choledocholithiasis. Could have passed a stone. LFT's downtrending. T. Bili 6.1 > 3.0 > 2.0 - s/p Perc Chole drain placement by Dr. Pascal Lux of IR on 5/4. During the procedure contrast was injected and demonsted passage of contrast through the cystic and common bile ducts to the level of the duodenum. Await cx's but may be able to de-escalate abx soon given patent cystic duct  - Patient will likely need cholecystostomy tube for at least 6 weeks and will need cardiac clearance before interval cholecystectomy  - We will follow with you  FEN - FLD (can be adv if tolerates), IVF per TRH  VTE - SCDs, Lovenox  ID - Maxipime, Flagyl, Vanc. WBC 111   LOS: 2 days    Vincent [name] , [facility] Surgery 02/08/2021, 9:10 AM Please see Amion for pager number during day hours 7:00am-4:30pm

## 2021-02-08 NOTE — Progress Notes (Signed)
[facility] Progress Note  Patient: Vincent [name]    HQI:[id number]  DOA: 02/05/2021     Date of Service: the patient was seen and examined on 02/08/2021  Brief hospital course: Past medical history of CAD, HTN, HLD, depression, hard of hearing, arthritis, CVA.  Presents with complaints of abdominal pain with shortness of breath.  Found to have acute on chronic cholecystitis. General surgery consulted.  SP percutaneous cholecystostomy drain placement. Currently plan is continue to biotic and monitor for improvement in diet.  Assessment and Plan: 1.  Severe sepsis, POA Acute on chronic cholecystitis Elevated LFT Possible UTI Tachycardic, tachypneic, febrile on presentation with leukocytosis. Concern for acute on chronic cholecystitis as well as UTI. Urine with 50,000 E. coli. General surgery consulted GI consult. MRCP done on 5/2 shows known choledocholithiasis. Underwent PERC cholecystostomy drain placement by IR on 5/3. Currently on IV cefepime and Flagyl. Patient was on IV fluid as well. Elevated now trending down. Blood cultures so far no growth.  Also no growth on drain fluid.  2.  Acute metabolic encephalopathy, POA Improving.  Back to baseline.  Monitor.  3.  CAD SP PCI Chronic combined systolic and diastolic CHF HTN HLD History of CVA Currently no acute complaint. Aspirin on hold can likely resume.  Will clarify with surgery Currently Imdur, Lopressor, Lasix on hold. Statin on hold due to elevated LFT. Resume when possible.  4.  Marijuana abuse UDS positive for marijuana. Recommend to quit.  5.  Obesity Placing the patient at high risk for poor outcome. Body mass index is 35.23 kg/m.   6.  Mid sacral ulcer. POA. Unstageable. Continue dressing changes.  Frequent turning.  Pressure Injury 02/06/21 Sacrum Mid Unstageable - Full thickness tissue loss in which the base of the injury is covered by slough (yellow, tan, gray, green or brown) and/or eschar  (tan, brown or black) in the wound bed. (Active)  02/06/21 0636  Location: Sacrum  Location Orientation: Mid  Staging: Unstageable - Full thickness tissue loss in which the base of the injury is covered by slough (yellow, tan, gray, green or brown) and/or eschar (tan, brown or black) in the wound bed.  Wound Description (Comments):   Present on Admission: Yes     Diet: Full liquid diet DVT Prophylaxis:   enoxaparin (LOVENOX) injection 40 mg Start: 02/08/21 1000 SCDs Start: 02/06/21 0425    Advance goals of care discussion: Full code  Family Communication: no family was present at bedside, at the time of interview.  The pt provided permission to discuss medical plan with the family. Discussed with wife on the phone on 5/4. Opportunity was given to ask question and all questions were answered satisfactorily.   Disposition:  Status is: Inpatient  Remains inpatient appropriate because:IV treatments appropriate due to intensity of illness or inability to take PO  Dispo: The patient is from: ALF              Anticipated d/c is to: SNF, patient and wife wants to go home.              Patient currently is not medically stable to d/c.   Difficult to place patient No  Subjective: Pain well controlled.  No nausea no vomiting.  No diarrhea reported.  Breathing okay.  No acute complaints right now.  Physical Exam:  General: Appear in mild distress, no Rash; Oral Mucosa Clear, moist. no Abnormal Neck Mass Or lumps, Conjunctiva normal  Cardiovascular: S1 and S2 Present, no Murmur,  Respiratory: good respiratory effort, Bilateral Air entry present and CTA, no Crackles, no wheezes Abdomen: Bowel Sound present, Soft and no tenderness Extremities: no Pedal edema Neurology: alert and oriented to time, place, and person affect appropriate. no new focal deficit, very hard of hearing Gait not checked due to patient safety concerns  Vitals:   02/07/21 1952 02/08/21 0332 02/08/21 0500 02/08/21  1344  BP: 128/71 115/68  (!) 142/72  Pulse: 100 (!) 106  94  Resp: 18 18  18   Temp: 98.2 F (36.8 C) 98 F (36.7 C)  (!) 97.5 F (36.4 C)  TempSrc: Oral Oral  Oral  SpO2: 100% 95%  100%  Weight:   105.1 kg   Height:        Intake/Output Summary (Last 24 hours) at 02/08/2021 1944 Last data filed at 02/08/2021 1800 Gross per 24 hour  Intake 495 ml  Output 2600 ml  Net -2105 ml   Filed Weights   02/06/21 0636 02/07/21 0436 02/08/21 0500  Weight: 90.6 kg 91.7 kg 105.1 kg    Data Reviewed: I have personally reviewed and interpreted daily labs, tele strips, imaging. I reviewed all nursing notes, pharmacy notes, vitals, pertinent old records I have discussed plan of care as described above with RN and patient/family.  CBC: Recent Labs  Lab 02/05/21 0016 02/06/21 0510 02/07/21 0350 02/08/21 0326  WBC 25.1* 19.4* 10.3 11.0*  NEUTROABS 21.4*  --  7.5 8.8*  HGB 12.0* 10.3* 10.5* 11.0*  HCT 35.4* 31.0* 33.0* 33.4*  MCV 89.2 90.4 93.2 91.0  PLT 280 229 253 831   Basic Metabolic Panel: Recent Labs  Lab 02/05/21 0016 02/06/21 0510 02/07/21 0350 02/08/21 0326  NA 137 136 136 136  K 3.5 3.5 3.4* 3.3*  CL 101 103 105 106  CO2 20* 22 22 21*  GLUCOSE 184* 214* 111* 129*  BUN 13 14 10  7*  CREATININE 0.79 0.63 0.56* 0.42*  CALCIUM 8.9 8.7* 8.7* 8.8*    Studies: No results found.  Scheduled Meds: . enoxaparin (LOVENOX) injection  40 mg Subcutaneous Q24H  . insulin aspart  0-9 Units Subcutaneous Q4H  . pantoprazole (PROTONIX) IV  40 mg Intravenous Q12H  . sodium chloride flush  5 mL Intracatheter Q8H   Continuous Infusions: . ceFEPime (MAXIPIME) IV 2 g (02/08/21 1324)  . metronidazole 500 mg (02/08/21 1414)   PRN Meds: morphine injection, oxyCODONE  Time spent: 35 minutes  Author: Berle Mull, MD [facility] 02/08/2021 7:44 PM  To reach On-call, see care teams to locate the attending and reach out via [email or web address]. Between 7PM-7AM, please contact  night-coverage If you still have difficulty reaching the attending provider, please page the [facility] (Director on Call) for [facility] on amion for assistance.

## 2021-02-08 NOTE — Progress Notes (Signed)
Referring Physician(s): Ninfa Linden  Supervising Physician: Jacqulynn Cadet  Patient Status:  [facility] - In-pt  Chief Complaint: Follow up percutaneous cholecystostomy placement 5/3 in IR  Subjective:  Patient reports pain at insertion site as well as the lower portion of his abdomen. He is frustrated because he can't open his milk or dial his phone and the nurse hasn't been by yet to help him with these things. He has to urinate.   Allergies: Penicillins  Medications: Prior to Admission medications   Medication Sig Start Date End Date Taking? Authorizing Provider  aspirin EC 81 MG tablet Take 1 tablet (81 mg total) by mouth daily. Patient taking differently: Take 81 mg by mouth at bedtime. 11/04/17  Yes Lyda Jester M, PA-C  docusate sodium (COLACE) 100 MG capsule Take 2 capsules (200 mg total) by mouth 2 (two) times daily. 01/27/21  Yes Hosie Poisson, MD  furosemide (LASIX) 40 MG tablet Take 1 tablet (40 mg total) by mouth daily. 01/27/21  Yes Hosie Poisson, MD  isosorbide mononitrate (IMDUR) 30 MG 24 hr tablet Take 1 tablet (30 mg total) by mouth daily. 08/02/20  Yes Burnell Blanks, MD  Maltodextrin-Xanthan Gum ([place]) POWD Use with every meals. Patient taking differently: Take 4 g by mouth See admin instructions. Use with every meals. 01/27/21  Yes Hosie Poisson, MD  metFORMIN (GLUCOPHAGE) 1000 MG tablet Take 1,000 mg by mouth 2 (two) times daily.   Yes [provider]  metoprolol tartrate (LOPRESSOR) 25 MG tablet Take 5 tablets (125 mg total) by mouth 2 (two) times daily. 01/27/21  Yes Hosie Poisson, MD  Multiple Vitamin (MULTIVITAMIN WITH MINERALS) TABS tablet Take 1 tablet by mouth daily. 01/28/21  Yes Hosie Poisson, MD  QUEtiapine (SEROQUEL) 25 MG tablet Take 1 tablet (25 mg total) by mouth 2 (two) times daily. 01/27/21  Yes Hosie Poisson, MD  simvastatin (ZOCOR) 40 MG tablet Take 1 tablet (40 mg total) by mouth at bedtime. 11/04/17  Yes  Lyda Jester M, PA-C  tamsulosin (FLOMAX) 0.4 MG CAPS capsule Take 1 capsule (0.4 mg total) by mouth daily. 01/28/21  Yes Hosie Poisson, MD  thiamine 100 MG tablet Take 1 tablet (100 mg total) by mouth daily. 01/28/21  Yes Hosie Poisson, MD  vitamin B-12 (CYANOCOBALAMIN) 1000 MCG tablet Take 1 tablet (1,000 mcg total) by mouth daily. 01/27/21  Yes Hosie Poisson, MD  nitroGLYCERIN (NITROSTAT) 0.4 MG SL tablet Place 1 tablet (0.4 mg total) under the tongue every 5 (five) minutes as needed for chest pain. Patient not taking: Reported on 02/06/2021 11/04/17   Lyda Jester M, PA-C  polyethylene glycol (MIRALAX / GLYCOLAX) 17 g packet Take 17 g by mouth daily as needed. Patient not taking: Reported on 02/06/2021 01/27/21   Hosie Poisson, MD  senna-docusate (SENOKOT-S) 8.6-50 MG tablet Take 1 tablet by mouth at bedtime as needed for mild constipation. Patient not taking: Reported on 02/06/2021 01/27/21   Hosie Poisson, MD     Vital Signs: BP (!) 142/72 (BP Location: Right Arm)   Pulse 94   Temp (!) 97.5 F (36.4 C) (Oral)   Resp 18   Ht 5\' 8"  (1.727 m)   Wt 231 lb 11.3 oz (105.1 kg)   SpO2 100%   BMI 35.23 kg/m   Physical Exam Vitals reviewed.  Constitutional:      General: He is not in acute distress.    Appearance: He is obese.  HENT:     Head: Normocephalic.  Cardiovascular:  Rate and Rhythm: Normal rate.  Pulmonary:     Effort: Pulmonary effort is normal.  Abdominal:     General: There is no distension.     Palpations: Abdomen is soft.     Tenderness: There is abdominal tenderness (mild over drain insertion site).     Comments: (+) RUQ drain to gravity with 200 cc clear, dark bile in bag. Insertion site clean, dry, dressed appropriately without leakage/bleeding.  Skin:    General: Skin is warm and dry.  Neurological:     Mental Status: He is alert. Mental status is at baseline.     Imaging: MR ABDOMEN MRCP WO CONTRAST  Result Date: 02/06/2021 CLINICAL DATA:   Jaundice. Gallbladder wall thickening and irregular gallbladder sludge on prior ultrasound. EXAM: MRI ABDOMEN WITHOUT CONTRAST  (INCLUDING MRCP) TECHNIQUE: Multiplanar multisequence MR imaging of the abdomen was performed. Heavily T2-weighted images of the biliary and pancreatic ducts were obtained, and three-dimensional MRCP images were rendered by post processing. COMPARISON:  CT 01/22/2021. Ultrasound 01/22/2021 and 02/06/2021. Nuclear medicine hepatobiliary scan 01/24/2021. FINDINGS: Despite efforts by the technologist and patient, mild motion artifact is present on today's exam and could not be eliminated. This reduces exam sensitivity and specificity. Lower chest: Trace bilateral pleural effusions with mild atelectasis at both lung bases. Hepatobiliary: No focal hepatic abnormalities are identified. The thin section MRCP images are motion degraded, but there is only mild extrahepatic biliary dilatation with the common hepatic duct measuring 8 mm maximally. No evidence of choledocholithiasis or intrahepatic biliary dilatation. Mild gallbladder wall thickening and irregular sludge within the gallbladder lumen are similar to the recent ultrasound. No discrete gallstones identified. Pancreas: Unremarkable. No pancreatic ductal dilatation or surrounding inflammatory changes. Spleen: Normal in size without focal abnormality. Adrenals/Urinary Tract: Both adrenal glands appear normal. There is a small cyst in the anterior interpolar region of the right kidney. The left kidney appears normal. No hydronephrosis. Stomach/Bowel: The stomach appears unremarkable for its degree of distension. No evidence of bowel wall thickening, distention or surrounding inflammatory change.Scattered colonic diverticulosis. Vascular/Lymphatic: There are no enlarged abdominal lymph nodes. Aortic atherosclerosis without aneurysm. Other: Supraumbilical hernia containing only fat appears stable. No ascites. Musculoskeletal: No acute or  significant osseous findings. Mild edema within the erector spinae musculature. IMPRESSION: 1. Minimal extrahepatic biliary dilatation without evidence of choledocholithiasis, pancreatic lesion or pancreatic ductal dilatation. 2. Persistent abnormal appearance of the gallbladder with wall thickening and heterogeneous sludge. As correlated with previous imaging, cholecystitis not excluded. 3. Scattered colonic diverticulosis without acute inflammation. 4. Stable supraumbilical hernia containing only fat. Electronically Signed   By: Richardean Sale M.D.   On: 02/06/2021 14:54   MR 3D Recon At Scanner  Result Date: 02/06/2021 CLINICAL DATA:  Jaundice. Gallbladder wall thickening and irregular gallbladder sludge on prior ultrasound. EXAM: MRI ABDOMEN WITHOUT CONTRAST  (INCLUDING MRCP) TECHNIQUE: Multiplanar multisequence MR imaging of the abdomen was performed. Heavily T2-weighted images of the biliary and pancreatic ducts were obtained, and three-dimensional MRCP images were rendered by post processing. COMPARISON:  CT 01/22/2021. Ultrasound 01/22/2021 and 02/06/2021. Nuclear medicine hepatobiliary scan 01/24/2021. FINDINGS: Despite efforts by the technologist and patient, mild motion artifact is present on today's exam and could not be eliminated. This reduces exam sensitivity and specificity. Lower chest: Trace bilateral pleural effusions with mild atelectasis at both lung bases. Hepatobiliary: No focal hepatic abnormalities are identified. The thin section MRCP images are motion degraded, but there is only mild extrahepatic biliary dilatation with the common hepatic duct measuring 8 mm  maximally. No evidence of choledocholithiasis or intrahepatic biliary dilatation. Mild gallbladder wall thickening and irregular sludge within the gallbladder lumen are similar to the recent ultrasound. No discrete gallstones identified. Pancreas: Unremarkable. No pancreatic ductal dilatation or surrounding inflammatory changes.  Spleen: Normal in size without focal abnormality. Adrenals/Urinary Tract: Both adrenal glands appear normal. There is a small cyst in the anterior interpolar region of the right kidney. The left kidney appears normal. No hydronephrosis. Stomach/Bowel: The stomach appears unremarkable for its degree of distension. No evidence of bowel wall thickening, distention or surrounding inflammatory change.Scattered colonic diverticulosis. Vascular/Lymphatic: There are no enlarged abdominal lymph nodes. Aortic atherosclerosis without aneurysm. Other: Supraumbilical hernia containing only fat appears stable. No ascites. Musculoskeletal: No acute or significant osseous findings. Mild edema within the erector spinae musculature. IMPRESSION: 1. Minimal extrahepatic biliary dilatation without evidence of choledocholithiasis, pancreatic lesion or pancreatic ductal dilatation. 2. Persistent abnormal appearance of the gallbladder with wall thickening and heterogeneous sludge. As correlated with previous imaging, cholecystitis not excluded. 3. Scattered colonic diverticulosis without acute inflammation. 4. Stable supraumbilical hernia containing only fat. Electronically Signed   By: Richardean Sale M.D.   On: 02/06/2021 14:54   US Abdomen Limited  Result Date: 02/06/2021 CLINICAL DATA:  AST abnormality. EXAM: ULTRASOUND ABDOMEN LIMITED RIGHT UPPER QUADRANT COMPARISON:  January 22, 2021 FINDINGS: Gallbladder: A large amount of heterogeneous sludge is seen within the gallbladder lumen. No gallstones are identified. The gallbladder wall measures 5.2 mm in thickness. No sonographic Murphy sign noted by sonographer. Common bile duct: Diameter: 7.6 mm Liver: No focal lesion identified. Diffusely increased echogenicity of the liver parenchyma is seen. Portal vein is patent on color Doppler imaging with normal direction of blood flow towards the liver. Other: None. IMPRESSION: 1. Gallbladder sludge and gallbladder wall thickening without  evidence of cholelithiasis. While this may represent sequelae associated with chronic cholecystitis, further evaluation with a nuclear medicine hepatobiliary scan is recommended. 2. Fatty liver. Electronically Signed   By: Virgina Norfolk M.D.   On: 02/06/2021 02:43   IR Perc Cholecystostomy  Result Date: 02/07/2021 INDICATION: Concern for acute cholecystitis. Poor operative candidate. Please perform image guided cholecystostomy tube placement for infection source control purposes. EXAM: ULTRASOUND AND FLUOROSCOPIC-GUIDED CHOLECYSTOSTOMY TUBE PLACEMENT COMPARISON:  MRCP-02/06/2021; right upper quadrant abdominal ultrasound-02/06/2021; nuclear medicine HIDA scan-01/24/2021; CT the chest, abdomen and pelvis-01/22/2021 MEDICATIONS: The patient is currently admitted to the hospital and on intravenous antibiotics. Antibiotics were administered within an appropriate time frame prior to skin puncture. ANESTHESIA/SEDATION: Moderate (conscious) sedation was employed during this procedure. Fentanyl 100 mcg was administered intravenously. Moderate Sedation Time: 14 minutes. The patient's level of consciousness and vital signs were monitored continuously by radiology nursing throughout the procedure under my direct supervision. CONTRAST:  38mL OMNIPAQUE IOHEXOL 300 MG/ML SOLN - administered into the gallbladder fossa. FLUOROSCOPY TIME:  1 minute, 36 seconds (92 mGy) COMPLICATIONS: None immediate. PROCEDURE: Informed written consent was obtained from the patient and the patient's family after a discussion of the risks, benefits and alternatives to treatment. Questions regarding the procedure were encouraged and answered. A timeout was performed prior to the initiation of the procedure. The right upper abdominal quadrant was prepped and draped in the usual sterile fashion, and a sterile drape was applied covering the operative field. Maximum barrier sterile technique with sterile gowns and gloves were used for the procedure.  A timeout was performed prior to the initiation of the procedure. Local anesthesia was provided with 1% lidocaine with epinephrine. Ultrasound scanning of the right  upper quadrant demonstrates a mildly dilated gallbladder with minimal amount of pericholecystic fluid. Utilizing a transhepatic approach, a 22 gauge needle was advanced into the gallbladder under direct ultrasound guidance. An ultrasound image was saved for documentation purposes. Appropriate intraluminal puncture was confirmed with the efflux of bile and advancement of an 0.018 wire into the gallbladder lumen. The needle was exchanged for an [place] set. A small amount of contrast was injected to confirm appropriate intraluminal positioning. Over a [place] wire, a 98.2-French Cook cholecystomy tube was advanced into the gallbladder fossa, coiled and locked. Bile was aspirated and a small amount of contrast was injected as several post procedural spot radiographic images were obtained in various obliquities demonstrating passage of contrast through the cystic and common bile ducts to the level of the duodenum. The catheter was secured to the skin with suture, connected to a drainage bag and a dressing was placed. The patient tolerated the procedure well without immediate post procedural complication. IMPRESSION: 1. Successful ultrasound and fluoroscopic guided placement of a 10.2 French cholecystostomy tube. 2. Limited cholangiogram performed following cholecystostomy tube placement demonstrates patency of the cystic and common bile ducts with passage of contrast to the level of duodenum. Electronically Signed   By: Sandi Mariscal M.D.   On: 02/07/2021 16:17   DG Chest Port 1 View  Result Date: 02/06/2021 CLINICAL DATA:  Slurred speech. EXAM: PORTABLE CHEST 1 VIEW COMPARISON:  January 21, 2021 FINDINGS: Decreased lung volumes are seen which is likely secondary to the degree of patient inspiration. Very mild left basilar atelectasis is noted. There is no  evidence of a pleural effusion or pneumothorax. The heart size and mediastinal contours are within normal limits. Mild calcification of the aortic arch is noted. The visualized skeletal structures are unremarkable. IMPRESSION: Very mild left basilar atelectasis. Electronically Signed   By: Virgina Norfolk M.D.   On: 02/06/2021 01:14    Labs:  CBC: Recent Labs    02/05/21 0016 02/06/21 0510 02/07/21 0350 02/08/21 0326  WBC 25.1* 19.4* 10.3 11.0*  HGB 12.0* 10.3* 10.5* 11.0*  HCT 35.4* 31.0* 33.0* 33.4*  PLT 280 229 253 284    COAGS: Recent Labs    02/05/21 0016 02/06/21 0510 02/07/21 0350 02/08/21 0326  INR 1.2 1.3* 1.2 1.1  APTT 33 32  --   --     BMP: Recent Labs    02/05/21 0016 02/06/21 0510 02/07/21 0350 02/08/21 0326  NA 137 136 136 136  K 3.5 3.5 3.4* 3.3*  CL 101 103 105 106  CO2 20* 22 22 21*  GLUCOSE 184* 214* 111* 129*  BUN 13 14 10  7*  CALCIUM 8.9 8.7* 8.7* 8.8*  CREATININE 0.79 0.63 0.56* 0.42*  GFRNONAA >60 >60 >60 >60    LIVER FUNCTION TESTS: Recent Labs    02/05/21 0016 02/06/21 0510 02/07/21 0350 02/08/21 0326  BILITOT 6.2* 6.1* 3.0* 2.0*  AST 219* 172* 110* 46*  ALT 153* 132* 104* 76*  ALKPHOS 403* 341* 294* 256*  PROT 6.1* 5.5* 5.6* 5.6*  ALBUMIN 2.7* 2.3* 2.4* 2.4*    Assessment and Plan:  70 y/o M admitted for septic shock 2/2 acalculous cholecystitis s/p percutaneous cholecystostomy placement 5/3 in IR seen today for follow up.  Patient continues to be mildly tender in the RUQ but this appears appropriate given recent drain placement. Drain with appropriate output so far, 200 cc of clear dark bile in bag on exam today. Insertion site unremarkable.  WBC relatively stable at 11.0 (10.3 >>  19.4 >> 25.1 on presentation), hgb stable at 11.0, LFTs improving, t.bili 2.0 (3.0 >> 6.1 >> 6.2 on presentation). CCS following and current plan is for perc chole to remain for at least 6 weeks followed by cholecystectomy if he's cleared by  cardiology.   IR will continue to follow - please call with questions or concerns.  Electronically Signed: Joaquim Nam, PA-C 02/08/2021, 3:09 PM   I spent a total of 15 Minutes at the the patient's bedside AND on the patient's hospital floor or unit, greater than 50% of which was counseling/coordinating care for percutaneous cholecystostomy follow up.

## 2021-02-08 NOTE — Evaluation (Signed)
Physical Therapy Evaluation Patient Details Name: Vincent [name] MRN: [id number] DOB: [birth year] Today's Date: 02/08/2021   History of Present Illness  Vincent [name] is a 70 y/o M with a PMH CAD , CHF, DM2, chronic incarcerated fat containing umbilical hernia,  and chronic pain who presented to the hospital 02/05/21 from SNF with AMS. ED workup revealed hypotension (94/57), tachypnea (RR 33), WBC 25, lactic acid 2.2 (now normal),  Elevated LFTs (AST 219, ALT 153, Alk Phos 403, t.bili 6.2) and RUQ U/S showing gallbladder sludge, wall thickening, and 7.6 mm CBD. S/P IR perc chole drain placed 02/07/21.  Clinical Impression  The patient is very debilitated, has been in SNF for rehab for several weeks.Patient requires max assistance to sit on bed edge and mod to min guard for balance seated on bed edge.  wife present. Plans are to return to SNF. Patient currently will require mechanical lift to trnasfer from bed. Has DJD left knee and right hip per wife.  Pt admitted with above diagnosis.  Pt currently with functional limitations due to the deficits listed below (see PT Problem List). Pt will benefit from skilled PT to increase their independence and safety with mobility to allow discharge to the venue listed below.       Follow Up Recommendations SNF    Equipment Recommendations   (next venue)    Recommendations for Other Services       Precautions / Restrictions Precautions Precautions: Fall , right lat. Chest drain     Mobility  Bed Mobility Overal bed mobility: Needs Assistance Bed Mobility: Rolling Rolling: Min assist   Supine to sit: Max assist;HOB elevated   Sit to sidelying: Max assist General bed mobility comments: multimodal cues to stay on task and follow directions. Assisted with legs, HOB used to raise trunk, bed pad to slide buttocks to bed edge. Max assist for legs back onto bed.    Transfers                 General transfer comment: NT  Ambulation/Gait                 Stairs            Wheelchair Mobility    Modified Rankin (Stroke Patients Only)       Balance Overall balance assessment: Needs assistance Sitting-balance support: Bilateral upper extremity supported;Feet supported Sitting balance-Leahy Scale: Poor Sitting balance - Comments: frequebnt cues to sit to mid line, intermittently props to left elb. sat x 10 minutes with mod to min guard, variable. Postural control: Posterior lean;Left lateral lean     Standing balance comment: unable to achieve standing                             Pertinent Vitals/Pain Pain Assessment: Faces Faces Pain Scale: Hurts little more Pain Location: L side at drain Pain Descriptors / Indicators: Grimacing;Moaning Pain Intervention(s): Monitored during session    Home Living Family/patient expects to be discharged to:: Skilled nursing facility                 Additional Comments: has been at [facility] x ~ 3 weeks.    Prior Function        ADL's / Homemaking Assistance Needed: per pt and wife, has not ambulated since going to SNF.        Hand Dominance        Extremity/Trunk Assessment   Upper Extremity  Assessment Upper Extremity Assessment: Generalized weakness    Lower Extremity Assessment Lower Extremity Assessment: LLE deficits/detail;Generalized weakness RLE Deficits / Details: significant varus def.       Communication      Cognition Arousal/Alertness: Awake/alert Behavior During Therapy: Anxious Overall Cognitive Status: History of cognitive impairments - at baseline Area of Impairment: Orientation;Attention;Following commands;Safety/judgement                   Current Attention Level: Sustained   Following Commands: Follows one step commands consistently Safety/Judgement: Decreased awareness of safety;Decreased awareness of deficits Awareness: Intellectual Problem Solving: Slow processing General Comments: repeats  situatuation frequently. Initially did not want to workw/PT, then consented.      General Comments      Exercises     Assessment/Plan    PT Assessment Patient needs continued PT services  PT Problem List Decreased strength;Decreased activity tolerance;Decreased balance;Decreased mobility;Decreased cognition;Decreased safety awareness       PT Treatment Interventions Functional mobility training;Therapeutic activities;Therapeutic exercise;Balance training;Neuromuscular re-education;Patient/family education    PT Goals (Current goals can be found in the Care Plan section)  Acute Rehab PT Goals Patient Stated Goal: to walk PT Goal Formulation: With patient/family Time For Goal Achievement: 02/22/21 Potential to Achieve Goals: Fair    Frequency Min 2X/week   Barriers to discharge        Co-evaluation               AM-PAC PT "6 Clicks" Mobility  Outcome Measure Help needed turning from your back to your side while in a flat bed without using bedrails?: A Lot Help needed moving from lying on your back to sitting on the side of a flat bed without using bedrails?: A Lot Help needed moving to and from a bed to a chair (including a wheelchair)?: Total Help needed standing up from a chair using your arms (e.g., wheelchair or bedside chair)?: Total Help needed to walk in hospital room?: Total Help needed climbing 3-5 steps with a railing? : Total 6 Click Score: 8    End of Session   Activity Tolerance: Patient tolerated treatment well Patient left: in bed;with call bell/phone within reach;with bed alarm set;with family/visitor present Nurse Communication: Mobility status;Need for lift equipment PT Visit Diagnosis: Muscle weakness (generalized) (M62.81);Difficulty in walking, not elsewhere classified (R26.2);Other abnormalities of gait and mobility (R26.89)    Time: 1100-1127 PT Time Calculation (min) (ACUTE ONLY): 27 min   Charges:   PT Evaluation $PT Eval Moderate  Complexity: 1 Mod PT Treatments $Therapeutic Activity: 8-22 mins        Tresa Endo PT Acute Rehabilitation Services Pager [phone] Office [phone]   Claretha Cooper 02/08/2021, 11:55 AM

## 2021-02-09 ENCOUNTER — Telehealth: Payer: Self-pay | Admitting: Cardiovascular Disease

## 2021-02-09 LAB — CBC WITH DIFFERENTIAL/PLATELET
Abs Immature Granulocytes: 0.11 10*3/uL — ABNORMAL HIGH (ref 0.00–0.07)
Basophils Absolute: 0 10*3/uL (ref 0.0–0.1)
Basophils Relative: 0 %
Eosinophils Absolute: 0 10*3/uL (ref 0.0–0.5)
Eosinophils Relative: 0 %
HCT: 34.6 % — ABNORMAL LOW (ref 39.0–52.0)
Hemoglobin: 11.2 g/dL — ABNORMAL LOW (ref 13.0–17.0)
Immature Granulocytes: 1 %
Lymphocytes Relative: 14 %
Lymphs Abs: 1.6 10*3/uL (ref 0.7–4.0)
MCH: 29.7 pg (ref 26.0–34.0)
MCHC: 32.4 g/dL (ref 30.0–36.0)
MCV: 91.8 fL (ref 80.0–100.0)
Monocytes Absolute: 1 10*3/uL (ref 0.1–1.0)
Monocytes Relative: 9 %
Neutro Abs: 8.5 10*3/uL — ABNORMAL HIGH (ref 1.7–7.7)
Neutrophils Relative %: 76 %
Platelets: 291 10*3/uL (ref 150–400)
RBC: 3.77 MIL/uL — ABNORMAL LOW (ref 4.22–5.81)
RDW: 14.1 % (ref 11.5–15.5)
WBC: 11.3 10*3/uL — ABNORMAL HIGH (ref 4.0–10.5)
nRBC: 0 % (ref 0.0–0.2)

## 2021-02-09 LAB — COMPREHENSIVE METABOLIC PANEL
ALT: 50 U/L — ABNORMAL HIGH (ref 0–44)
AST: 22 U/L (ref 15–41)
Albumin: 2.4 g/dL — ABNORMAL LOW (ref 3.5–5.0)
Alkaline Phosphatase: 227 U/L — ABNORMAL HIGH (ref 38–126)
Anion gap: 11 (ref 5–15)
BUN: <5 mg/dL — ABNORMAL LOW (ref 8–23)
CO2: 19 mmol/L — ABNORMAL LOW (ref 22–32)
Calcium: 8.8 mg/dL — ABNORMAL LOW (ref 8.9–10.3)
Chloride: 103 mmol/L (ref 98–111)
Creatinine, Ser: 0.52 mg/dL — ABNORMAL LOW (ref 0.61–1.24)
GFR, Estimated: >60 mL/min (ref >60)
Glucose, Bld: 114 mg/dL — ABNORMAL HIGH (ref 70–99)
Potassium: 3.8 mmol/L (ref 3.5–5.1)
Sodium: 133 mmol/L — ABNORMAL LOW (ref 135–145)
Total Bilirubin: 1.7 mg/dL — ABNORMAL HIGH (ref 0.3–1.2)
Total Protein: 5.9 g/dL — ABNORMAL LOW (ref 6.5–8.1)

## 2021-02-09 LAB — GLUCOSE, CAPILLARY
Glucose-Capillary: 115 mg/dL — ABNORMAL HIGH (ref 70–99)
Glucose-Capillary: 118 mg/dL — ABNORMAL HIGH (ref 70–99)
Glucose-Capillary: 119 mg/dL — ABNORMAL HIGH (ref 70–99)
Glucose-Capillary: 122 mg/dL — ABNORMAL HIGH (ref 70–99)
Glucose-Capillary: 153 mg/dL — ABNORMAL HIGH (ref 70–99)
Glucose-Capillary: 156 mg/dL — ABNORMAL HIGH (ref 70–99)

## 2021-02-09 LAB — PROTIME-INR
INR: 1.2 (ref 0.8–1.2)
Prothrombin Time: 15.3 seconds — ABNORMAL HIGH (ref 11.4–15.2)

## 2021-02-09 MED ORDER — ACETAMINOPHEN 325 MG PO TABS
650.0000 mg | ORAL_TABLET | Freq: Four times a day (QID) | ORAL | Status: DC | PRN
  Filled 2021-02-09: qty 2

## 2021-02-09 MED ORDER — ONDANSETRON HCL 4 MG/2ML IJ SOLN: 4.0000 mg | Freq: Four times a day (QID) | INTRAMUSCULAR | Status: DC | PRN

## 2021-02-09 MED ORDER — PANTOPRAZOLE SODIUM 40 MG PO TBEC
40.0000 mg | DELAYED_RELEASE_TABLET | Freq: Every day | ORAL | Status: DC
  Administered 2021-02-09 – 2021-02-11 (×3): 40 mg via ORAL
  Filled 2021-02-09 (×3): qty 1

## 2021-02-09 NOTE — Telephone Encounter (Signed)
Spoke with the patient's wife who states that they have changed the patient's medications up in the hospital and he is now on a higher dose of metoprolol. Advised the patient's wife to adhere to medications that the hospital advises him to take when he is discharged. He is currently scheduled for a follow up with Kathyrn Drown in July.

## 2021-02-09 NOTE — Progress Notes (Addendum)
Subjective: CC: Patient reports some soreness of the RUQ around drain that is improved from yesterday and controlled. Otherwise no abdominal pain. Tolerating FLD without increased abdominal pain, n/v. Passing flatus and BM yesterday.   Objective: Vital signs in last 24 hours: Temp:  [97.5 F (36.4 C)-98.5 F (36.9 C)] 98.5 F (36.9 C) (05/05 0418) Pulse Rate:  [94-98] 98 (05/05 0418) Resp:  [18] 18 (05/05 0418) BP: (120-142)/(72-75) 120/75 (05/05 0418) SpO2:  [94 %-100 %] 94 % (05/05 0418) Weight:  [91.5 kg] 91.5 kg (05/05 0418) Last BM Date: 02/08/21  Intake/Output from previous day: 05/04 0701 - 05/05 0700 In: 495 [P.O.:480] Out: 3750 [Urine:3250; Drains:500] Intake/Output this shift: No intake/output data recorded.  PE: Gen: Alert, NAD, pleasant Card: Reg Pulm:Normal rate and effort Abd: Soft,ND, mild tenderness of the RUQ around IR drain that appears appropriate and is without peritonitis, supraumbilical hernia that is soft and NT, +BS. IR drain in RUQ with bilious output.  Psych: A&Ox3  Skin: no rashes noted, warm and dry  Lab Results:  Recent Labs    02/08/21 0326 02/09/21 0353  WBC 11.0* 11.3*  HGB 11.0* 11.2*  HCT 33.4* 34.6*  PLT 284 291   BMET Recent Labs    02/08/21 0326 02/09/21 0353  NA 136 133*  K 3.3* 3.8  CL 106 103  CO2 21* 19*  GLUCOSE 129* 114*  BUN 7* <5*  CREATININE 0.42* 0.52*  CALCIUM 8.8* 8.8*   PT/INR Recent Labs    02/08/21 0326 02/09/21 0353  LABPROT 14.7 15.3*  INR 1.1 1.2   CMP     Component Value Date/Time   NA 133 (L) 02/09/2021 0353   K 3.8 02/09/2021 0353   CL 103 02/09/2021 0353   CO2 19 (L) 02/09/2021 0353   GLUCOSE 114 (H) 02/09/2021 0353   BUN <5 (L) 02/09/2021 0353   CREATININE 0.52 (L) 02/09/2021 0353   CALCIUM 8.8 (L) 02/09/2021 0353   PROT 5.9 (L) 02/09/2021 0353   ALBUMIN 2.4 (L) 02/09/2021 0353   AST 22 02/09/2021 0353   ALT 50 (H) 02/09/2021 0353   ALKPHOS 227 (H) 02/09/2021 0353    BILITOT 1.7 (H) 02/09/2021 0353   GFRNONAA >60 02/09/2021 0353   GFRAA >60 08/10/2016 0417   Lipase     Component Value Date/Time   LIPASE 65 (H) 01/06/2014 1200       Studies/Results: IR Perc Cholecystostomy  Result Date: 02/07/2021 INDICATION: Concern for acute cholecystitis. Poor operative candidate. Please perform image guided cholecystostomy tube placement for infection source control purposes. EXAM: ULTRASOUND AND FLUOROSCOPIC-GUIDED CHOLECYSTOSTOMY TUBE PLACEMENT COMPARISON:  MRCP-02/06/2021; right upper quadrant abdominal ultrasound-02/06/2021; nuclear medicine HIDA scan-01/24/2021; CT the chest, abdomen and pelvis-01/22/2021 MEDICATIONS: The patient is currently admitted to the hospital and on intravenous antibiotics. Antibiotics were administered within an appropriate time frame prior to skin puncture. ANESTHESIA/SEDATION: Moderate (conscious) sedation was employed during this procedure. Fentanyl 100 mcg was administered intravenously. Moderate Sedation Time: 14 minutes. The patient's level of consciousness and vital signs were monitored continuously by radiology nursing throughout the procedure under my direct supervision. CONTRAST:  43mL OMNIPAQUE IOHEXOL 300 MG/ML SOLN - administered into the gallbladder fossa. FLUOROSCOPY TIME:  1 minute, 36 seconds (92 mGy) COMPLICATIONS: None immediate. PROCEDURE: Informed written consent was obtained from the patient and the patient's family after a discussion of the risks, benefits and alternatives to treatment. Questions regarding the procedure were encouraged and answered. A timeout was performed prior to the initiation  of the procedure. The right upper abdominal quadrant was prepped and draped in the usual sterile fashion, and a sterile drape was applied covering the operative field. Maximum barrier sterile technique with sterile gowns and gloves were used for the procedure. A timeout was performed prior to the initiation of the procedure.  Local anesthesia was provided with 1% lidocaine with epinephrine. Ultrasound scanning of the right upper quadrant demonstrates a mildly dilated gallbladder with minimal amount of pericholecystic fluid. Utilizing a transhepatic approach, a 22 gauge needle was advanced into the gallbladder under direct ultrasound guidance. An ultrasound image was saved for documentation purposes. Appropriate intraluminal puncture was confirmed with the efflux of bile and advancement of an 0.018 wire into the gallbladder lumen. The needle was exchanged for an [place] set. A small amount of contrast was injected to confirm appropriate intraluminal positioning. Over a Benson wire, a 20.2-French Cook cholecystomy tube was advanced into the gallbladder fossa, coiled and locked. Bile was aspirated and a small amount of contrast was injected as several post procedural spot radiographic images were obtained in various obliquities demonstrating passage of contrast through the cystic and common bile ducts to the level of the duodenum. The catheter was secured to the skin with suture, connected to a drainage bag and a dressing was placed. The patient tolerated the procedure well without immediate post procedural complication. IMPRESSION: 1. Successful ultrasound and fluoroscopic guided placement of a 10.2 French cholecystostomy tube. 2. Limited cholangiogram performed following cholecystostomy tube placement demonstrates patency of the cystic and common bile ducts with passage of contrast to the level of duodenum. Electronically Signed   By: Sandi Mariscal M.D.   On: 02/07/2021 16:17    Anti-infectives: Anti-infectives (From admission, onward)   Start     Dose/Rate Route Frequency Ordered Stop   02/07/21 0100  vancomycin (VANCOREADY) IVPB 2000 mg/400 mL  Status:  Discontinued        2,000 mg 200 mL/hr over 120 Minutes Intravenous Every 24 hours 02/06/21 0316 02/07/21 1154   02/06/21 2000  ceFEPIme (MAXIPIME) 2 g in sodium chloride 0.9 %  100 mL IVPB        2 g 200 mL/hr over 30 Minutes Intravenous Every 8 hours 02/06/21 1202     02/06/21 1000  aztreonam (AZACTAM) 1 g in sodium chloride 0.9 % 100 mL IVPB  Status:  Discontinued        1 g 200 mL/hr over 30 Minutes Intravenous Every 8 hours 02/06/21 0248 02/06/21 1202   02/06/21 0400  metroNIDAZOLE (FLAGYL) IVPB 500 mg        500 mg 100 mL/hr over 60 Minutes Intravenous Every 8 hours 02/06/21 0248     02/06/21 0000  vancomycin (VANCOREADY) IVPB 2000 mg/400 mL        2,000 mg 200 mL/hr over 120 Minutes Intravenous  Once 02/05/21 2348 02/06/21 0511   02/05/21 2345  aztreonam (AZACTAM) 2 g in sodium chloride 0.9 % 100 mL IVPB        2 g 200 mL/hr over 30 Minutes Intravenous  Once 02/05/21 2336 02/06/21 0101   02/05/21 2345  metroNIDAZOLE (FLAGYL) IVPB 500 mg  Status:  Discontinued        500 mg 100 mL/hr over 60 Minutes Intravenous  Once 02/05/21 2336 02/06/21 0256   02/05/21 2345  vancomycin (VANCOCIN) IVPB 1000 mg/200 mL premix  Status:  Discontinued        1,000 mg 200 mL/hr over 60 Minutes Intravenous  Once 02/05/21 2336 02/05/21  2348       Assessment/Plan Chronic systolic and diastolic heart failure CAD-s/p PCI to LAD in 2007 HTN HLD DM Obesity Chronic pain Supraumbilical hernia - contains only fat on imaging. NT on exam.  Recenthospitalization 4/7-4/22/2034for AMSwhich was attributed to B12 deficiency, incidental small right parietal strokeand polypharmacy  Acute on Chronic Cholecystitis Elevated LFT's - MRCP negative for choledocholithiasis. Could have passed a stone. LFT's downtrending. T. Bili 6.1 > 3.0 > 2.0 > 1.7 - s/p Perc Chole drain placement by Dr. Pascal Lux of IR on 5/4.  - Adv diet. If tolerates, patient can be discharged with 14 days of abx from our standpoint - Patient will likely need cholecystostomy tube for at least 6 weeks and will need cardiac clearance before interval cholecystectomy  - We will follow with you  FEN -HH (wife reports he  was eating regular food before admission), IVF per TRH  VTE -SCDs, Lovenox ID -Maxipime, Flagyl, Vanc. WBC 11.3 Follow-Up - IR, Cards, Dr. Ninfa Linden   LOS: 3 days    Jillyn Ledger , [facility] 02/09/2021, 10:11 AM Please see Amion for pager number during day hours 7:00am-4:30pm

## 2021-02-09 NOTE — Plan of Care (Signed)
In to assist patient, provided with ice water per request, RR even and unlabored. Biliary to rlq, clean dry, and intact. Will continue to monitor.  Problem: Education: Goal: Knowledge of General Education information will improve Description: Including pain rating scale, medication(s)/side effects and non-pharmacologic comfort measures Outcome: Progressing   Problem: Clinical Measurements: Goal: Ability to maintain clinical measurements within normal limits will improve Outcome: Progressing Goal: Will remain free from infection Outcome: Progressing Goal: Diagnostic test results will improve Outcome: Progressing Goal: Respiratory complications will improve Outcome: Progressing   Problem: Activity: Goal: Risk for activity intolerance will decrease Outcome: Progressing   Problem: Nutrition: Goal: Adequate nutrition will be maintained Outcome: Progressing   Problem: Elimination: Goal: Will not experience complications related to bowel motility Outcome: Progressing Goal: Will not experience complications related to urinary retention Outcome: Progressing   Problem: Pain Managment: Goal: General experience of comfort will improve Outcome: Progressing   Problem: Safety: Goal: Ability to remain free from injury will improve Outcome: Progressing   Problem: Skin Integrity: Goal: Risk for impaired skin integrity will decrease Outcome: Progressing

## 2021-02-09 NOTE — Care Management Important Message (Signed)
Important Message  Patient Details IM Letter given to the Patient. Name: Vincent [name] MRN: [id number] Date of Birth: 1951-05-30   Medicare Important Message Given:  Yes     Kerin Salen 02/09/2021, 12:06 PM

## 2021-02-09 NOTE — Discharge Instructions (Signed)
Cholecystostomy Cholecystostomy is a procedure to drain fluid from the gallbladder by using a flexible drainage tube (catheter). The gallbladder is a pear-shaped organ that lies beneath the liver on the right side of the body. The gallbladder stores bile, which is a fluid that helps the body digest fats. You may have this procedure:  If your gallbladder is infected due to gallstones (cholecystitis).  To control a gallbladder infection if you cannot have gallbladder surgery. Tell a health care provider about:  Any allergies you have.  All medicines you are taking, including vitamins, herbs, eye drops, creams, and over-the-counter medicines.  Any problems you or family members have had with anesthetic medicines.  Any blood disorders you have.  Any surgeries you have had.  Any medical conditions you have.  Whether you are pregnant or may be pregnant. What are the risks? Generally, this is a safe procedure. However, problems may occur, including:  The catheter moving out of place.  Clogging of the catheter.  Infection of the incision site.  Internal bleeding.  Leakage of bile from the gallbladder.  Infection inside the abdomen (peritonitis).  Damage to other structures or organs.  Low blood pressure and slowed heart rate.  Allergic reactions to medicines or dyes. What happens before the procedure? Most often, you will already be in the hospital receiving treatment for a gallbladder infection or other problems with the gallbladder. Staying hydrated Follow instructions from your health care provider about hydration, which may include:  Up to 2 hours before the procedure - you may continue to drink clear liquids, such as water, clear fruit juice, black coffee, and plain tea.   Eating and drinking Follow instructions from your health care provider about eating and drinking, which may include:  8 hours before the procedure - stop eating heavy meals or foods, such as meat,  fried foods, or fatty foods.  6 hours before the procedure - stop eating light meals or foods, such as toast or cereal.  6 hours before the procedure - stop drinking milk or drinks that contain milk.  2 hours before the procedure - stop drinking clear liquids. Medicines  Ask your health care provider about: ? Changing or stopping your regular medicines. This is especially important if you are taking diabetes medicines or blood thinners. ? Taking medicines such as aspirin and ibuprofen. These medicines can thin your blood. Do not take these medicines unless your health care provider tells you to take them. ? Taking over-the-counter medicines, vitamins, herbs, and supplements. General instructions  You may have an exam or testing, including: ? Imaging studies of your gallbladder. ? Blood tests.  Do not use any products that contain nicotine or tobacco before the procedure. These products include cigarettes, e-cigarettes, and chewing tobacco. If you need help quitting, ask your health care provider. Also, do not use these products after the procedure.  Plan to have someone take you home from the hospital or clinic.  If you will be going home right after the procedure, plan to have someone with you for 24 hours.  Ask your health care provider how your surgical site will be marked or identified.  Ask your health care provider what steps will be taken to help prevent infection. These may include: ? Removing hair at the surgery site. ? Washing skin with a germ-killing soap. ? Taking antibiotic medicine. What happens during the procedure?  An IV will be inserted into one of your veins.  You will be given one or more of  the following: ? A medicine to help you relax (sedative). ? A medicine to numb the area (local anesthetic).  A small incision will be made in your abdomen.  A long needle or a wide puncturing tool (trocar) will be put through the incision.  Your health care provider  will use an imaging study (ultrasound) to guide the needle or trocar into your gallbladder.  After the needle or trocar is in your gallbladder, a small amount of dye may be injected. An X-ray may be taken to make sure that the needle is in the correct place.  A catheter will be placed through the needle.  The needle or trocar will be removed.  The catheter will be secured to your skin with stitches (sutures).  The catheter will be connected to a drainage bag. Fluid will drain from the gallbladder into the bag. Some of this bile may be sent to the lab to be tested.  A bandage (dressing) will be placed over the incision site where the catheter was placed. The procedure may vary among health care providers and hospitals. What happens after the procedure?  Your blood pressure, heart rate, breathing rate, and blood oxygen level will be monitored until you leave the hospital or clinic.  Dye may be injected through your catheter to check the catheter and your gallbladder.  Your gallbladder may be flushed out (irrigated) through the catheter.  Your catheter and drainage bag may need to stay in place for several weeks or as told by your health care provider. Summary  Cholecystostomy is a procedure to drain fluid from the gallbladder using a flexible drainage tube (catheter).  Generally, this is a safe procedure. However, problems may occur, including bleeding, infection, clogging of the catheter, damage to other structures or organs, or low blood pressure and slowed heart rate.  Follow instructions before the procedure. You will be told when to stop all food and drink, whether to change or stop any medicines, and what tests need to be done.  After the procedure, you will be monitored in the hospital, and you may have a catheter and drainage bag when you go home. This information is not intended to replace advice given to you by your health care provider. Make sure you discuss any questions you  have with your health care provider. Document Revised: 04/21/2018 Document Reviewed: 04/21/2018 Elsevier Patient Education  [place].

## 2021-02-09 NOTE — TOC Progression Note (Signed)
Transition of Care [facility]) - Progression Note    Patient Details  Name: Vincent [name] MRN: [id number] Date of Birth: 10-17-1950  Transition of Care [facility]) CM/SW Contact  Purcell Mouton, RN Phone Number: 02/09/2021, 2:28 PM  Clinical Narrative:    Spoke with pt's wife concerning discharge to SNF. Pt's wife states pt do not want to go back to [facility]. SNF authorization started with [employer] and will need to be reviewed by Medical Director.    Expected Discharge Plan: Home/Self Care Barriers to Discharge: No Barriers Identified  Expected Discharge Plan and Services Expected Discharge Plan: Home/Self Care       Living arrangements for the past 2 months: Single Family Home                                       Social Determinants of Health (SDOH) Interventions    Readmission Risk Interventions No flowsheet data found.

## 2021-02-09 NOTE — Progress Notes (Signed)
Shift Summary:   Uneventful shift. Diet advanced. Tolerated advancement of diet, VSS during this shift. Remained alert to self, wife, 10 ccs from biliary drain emptied. Refused most of turns during this shift. Complained of buttocks hurting but refused pain medication. Wife visited during this shift. No other needs identified. Will continue to monitor.

## 2021-02-09 NOTE — Progress Notes (Signed)
[facility] Progress Note  Patient: Vincent [name]    PYK:[id number]  DOA: 02/05/2021     Date of Service: the patient was seen and examined on 02/09/2021  Brief hospital course: Past medical history of CAD, HTN, HLD, depression, hard of hearing, arthritis, CVA.  Presents with complaints of abdominal pain with shortness of breath.  Found to have acute on chronic cholecystitis. General surgery consulted.  SP percutaneous cholecystostomy drain placement. Currently plan is monitor for improvement in tolerance of oral diet.  Assessment and Plan: 1.  Severe sepsis, POA Acute on chronic cholecystitis Elevated LFT Possible UTI Tachycardic, tachypneic, febrile on presentation with leukocytosis. Concern for acute on chronic cholecystitis as well as UTI. Urine with 50,000 E. coli. General surgery consulted GI consult. MRCP done on 5/2 shows known choledocholithiasis. Underwent PERC cholecystostomy drain placement by IR on 5/3. Currently on IV cefepime and Flagyl. Patient was on IV fluid as well. LFT now trending down. Blood cultures so far no growth.  Also no growth on drain fluid. Currently diet is advanced.  If tolerates over last 24 hours patient will be medically ready to be discharged from surgical perspective.  14 days of antibiotic recommended for the day of surgery.  2.  Acute metabolic encephalopathy, POA Improving.  Back to baseline.  Monitor.  3.  CAD SP PCI Chronic combined systolic and diastolic CHF HTN HLD History of CVA Currently no acute complaint. Aspirin on hold can likely resume.  Will clarify with surgery Currently Imdur, Lopressor, Lasix on hold. Statin on hold due to elevated LFT. Resume when possible.  4.  Marijuana abuse UDS positive for marijuana. Recommend to quit.  5.  Obesity Placing the patient at high risk for poor outcome. Body mass index is 30.67 kg/m.   6.  Mid sacral ulcer. POA. Unstageable. Continue dressing changes.  Frequent  turning.  Pressure Injury 02/06/21 Sacrum Mid Unstageable - Full thickness tissue loss in which the base of the injury is covered by slough (yellow, tan, gray, green or brown) and/or eschar (tan, brown or black) in the wound bed. (Active)  02/06/21 0636  Location: Sacrum  Location Orientation: Mid  Staging: Unstageable - Full thickness tissue loss in which the base of the injury is covered by slough (yellow, tan, gray, green or brown) and/or eschar (tan, brown or black) in the wound bed.  Wound Description (Comments):   Present on Admission: Yes     Diet: Full liquid diet DVT Prophylaxis:   enoxaparin (LOVENOX) injection 40 mg Start: 02/08/21 1000 SCDs Start: 02/06/21 0425    Advance goals of care discussion: Full code  Family Communication: no family was present at bedside, at the time of interview.  The pt provided permission to discuss medical plan with the family. Discussed with wife on the phone on 5/5. Opportunity was given to ask question and all questions were answered satisfactorily.   Disposition:  Status is: Inpatient  Remains inpatient appropriate because:IV treatments appropriate due to intensity of illness or inability to take PO  Dispo: The patient is from: ALF              Anticipated d/c is to: SNF, patient and wife wanted to go home but currently agreeable to go to SNF.              Patient currently is not medically stable to d/c.   Difficult to place patient No  Subjective: No acute complaint.  No nausea no vomiting.  Pain well controlled.  Physical Exam:  General: Appear in mild distress, no Rash; Oral Mucosa Clear, moist. no Abnormal Neck Mass Or lumps, Conjunctiva normal  Cardiovascular: S1 and S2 Present, no Murmur, Respiratory: good respiratory effort, Bilateral Air entry present and CTA, no Crackles, no wheezes Abdomen: Bowel Sound present, Soft and no tenderness Extremities: no Pedal edema Neurology: alert and oriented to time, place, and  person affect appropriate. no new focal deficit Gait not checked due to patient safety concerns  Vitals:   02/08/21 0500 02/08/21 1344 02/09/21 0418 02/09/21 1452  BP:  (!) 142/72 120/75 127/62  Pulse:  94 98 100  Resp:  18 18 18   Temp:  (!) 97.5 F (36.4 C) 98.5 F (36.9 C) 98.5 F (36.9 C)  TempSrc:  Oral Axillary Oral  SpO2:  100% 94% 100%  Weight: 105.1 kg  91.5 kg   Height:        Intake/Output Summary (Last 24 hours) at 02/09/2021 1912 Last data filed at 02/09/2021 1444 Gross per 24 hour  Intake 15 ml  Output 4155 ml  Net -4140 ml   Filed Weights   02/07/21 0436 02/08/21 0500 02/09/21 0418  Weight: 91.7 kg 105.1 kg 91.5 kg    Data Reviewed: I have personally reviewed and interpreted daily labs, tele strips, imaging. I reviewed all nursing notes, pharmacy notes, vitals, pertinent old records I have discussed plan of care as described above with RN and patient/family.  CBC: Recent Labs  Lab 02/05/21 0016 02/06/21 0510 02/07/21 0350 02/08/21 0326 02/09/21 0353  WBC 25.1* 19.4* 10.3 11.0* 11.3*  NEUTROABS 21.4*  --  7.5 8.8* 8.5*  HGB 12.0* 10.3* 10.5* 11.0* 11.2*  HCT 35.4* 31.0* 33.0* 33.4* 34.6*  MCV 89.2 90.4 93.2 91.0 91.8  PLT 280 229 253 284 161   Basic Metabolic Panel: Recent Labs  Lab 02/05/21 0016 02/06/21 0510 02/07/21 0350 02/08/21 0326 02/09/21 0353  NA 137 136 136 136 133*  K 3.5 3.5 3.4* 3.3* 3.8  CL 101 103 105 106 103  CO2 20* 22 22 21* 19*  GLUCOSE 184* 214* 111* 129* 114*  BUN 13 14 10  7* <5*  CREATININE 0.79 0.63 0.56* 0.42* 0.52*  CALCIUM 8.9 8.7* 8.7* 8.8* 8.8*    Studies: No results found.  Scheduled Meds: . enoxaparin (LOVENOX) injection  40 mg Subcutaneous Q24H  . insulin aspart  0-9 Units Subcutaneous Q4H  . pantoprazole  40 mg Oral Daily  . sodium chloride flush  5 mL Intracatheter Q8H   Continuous Infusions: . ceFEPime (MAXIPIME) IV 2 g (02/09/21 1156)  . metronidazole 500 mg (02/09/21 1351)   PRN Meds:  acetaminophen, morphine injection, ondansetron (ZOFRAN) IV, oxyCODONE  Time spent: 35 minutes  Author: Berle Mull, MD [facility] 02/09/2021 7:12 PM  To reach On-call, see care teams to locate the attending and reach out via [email or web address]. Between 7PM-7AM, please contact night-coverage If you still have difficulty reaching the attending provider, please page the [facility] (Director on Call) for [facility] on amion for assistance.

## 2021-02-09 NOTE — Telephone Encounter (Signed)
Patient is currently hospitalized. Received call from wife stating that the patiently needs to be seen asap due to provider. Patient already scheduled for 04/11/21

## 2021-02-09 NOTE — NC FL2 (Signed)
[place] LEVEL OF CARE SCREENING TOOL     IDENTIFICATION  Patient Name: Vincent [name] Birthdate: Nov 22, 1950 Sex: male Admission Date (Current Location): 02/05/2021  [facility] and [place] Number:  Herbalist and Address:  [facility],  [address] [place], [place]      Provider Number: [id number]  Attending Physician Name and Address:  Lavina Hamman, MD  Relative Name and Phone Number:  Angello, Chien [phone]    Current Level of Care: Hospital Recommended Level of Care: [place] Prior Approval Number:    Date Approved/Denied:   PASRR Number: [id number]  Discharge Plan: SNF    Current Diagnoses: Patient Active Problem List   Diagnosis Date Noted  . Severe sepsis with septic shock ([place]) 02/06/2021  . Chronic cholecystitis 02/06/2021  . Sacral decubitus ulcer 02/06/2021  . Severe sepsis with septic shock (CODE) ([place]) 02/06/2021  . Vitamin B12 deficiency 01/27/2021  . Diabetes 1.5, managed as type 2 ([place]) 01/27/2021  . Chronic systolic CHF (congestive heart failure) ([place]) 01/27/2021  . CVA (cerebral vascular accident) ([place]) 01/12/2021  . Weakness   . Precordial pain   . Right knee pain 01/07/2018  . History of total right knee replacement 01/07/2018  . SOB (shortness of breath) 08/12/2016  . Tachycardia 08/12/2016  . Low grade fever 08/12/2016  . Osteoarthritis of right knee 08/09/2016  . OSA (obstructive sleep apnea) 11/29/2011  . Syncope 07/24/2011  . ARM NUMBNESS 06/19/2010  . EDEMA 03/13/2010  . FASTING HYPERGLYCEMIA 03/13/2010  . CORONARY ATHEROSCLEROSIS NATIVE CORONARY ARTERY 06/20/2009  . COLONIC POLYPS, ADENOMATOUS 01/31/2009  . HLD (hyperlipidemia) 01/31/2009  . Hypertension 01/31/2009  . Coronary atherosclerosis 01/31/2009  . HEMORRHOIDS 01/31/2009  . DIVERTICULOSIS, MILD 01/31/2009    Orientation RESPIRATION BLADDER Height & Weight     Self,Place  O2 Incontinent Weight:  91.5 kg Height:  5\' 8"  (172.7 cm)  BEHAVIORAL SYMPTOMS/MOOD NEUROLOGICAL BOWEL NUTRITION STATUS      Continent Diet (Heart Healthy)  AMBULATORY STATUS COMMUNICATION OF NEEDS Skin   Total Care Verbally Other (Comment) (Sacrum mid unstageable)                       Personal Care Assistance Level of Assistance  Bathing,Feeding,Dressing Bathing Assistance: Maximum assistance Feeding assistance: Maximum assistance Dressing Assistance: Maximum assistance     Functional Limitations Info  Sight,Hearing,Speech Sight Info: Adequate Hearing Info: Adequate Speech Info: Impaired    SPECIAL CARE FACTORS FREQUENCY  PT (By licensed PT),OT (By licensed OT)     PT Frequency: x5 week OT Frequency: x5 week     Speech Therapy Frequency: x5 week      Contractures Contractures Info: Not present    Additional Factors Info  Code Status,Allergies Code Status Info: FULL Allergies Info: Penicillins           Current Medications (02/09/2021):  This is the current hospital active medication list Current Facility-Administered Medications  Medication Dose Route Frequency Provider Last Rate Last Admin  . acetaminophen (TYLENOL) tablet 650 mg  650 mg Oral Q6H PRN Lavina Hamman, MD      . ceFEPIme (MAXIPIME) 2 g in sodium chloride 0.9 % 100 mL IVPB  2 g Intravenous Q8H Alma Friendly, MD 200 mL/hr at 02/09/21 1156 2 g at 02/09/21 1156  . enoxaparin (LOVENOX) injection 40 mg  40 mg Subcutaneous Q24H Polly Cobia, RPH   40 mg at 02/09/21 1157  . insulin aspart (novoLOG)  injection 0-9 Units  0-9 Units Subcutaneous Q4H Alma Friendly, MD   1 Units at 02/09/21 0103  . metroNIDAZOLE (FLAGYL) IVPB 500 mg  500 mg Intravenous Q8H Florina Ou V, MD 100 mL/hr at 02/09/21 1351 500 mg at 02/09/21 1351  . morphine 2 MG/ML injection 2 mg  2 mg Intravenous Q4H PRN Alma Friendly, MD   2 mg at 02/07/21 2105  . ondansetron (ZOFRAN) injection 4 mg  4 mg Intravenous Q6H PRN Lavina Hamman, MD       . oxyCODONE (Oxy IR/ROXICODONE) immediate release tablet 5 mg  5 mg Oral Q6H PRN Alma Friendly, MD   5 mg at 02/08/21 2102  . pantoprazole (PROTONIX) EC tablet 40 mg  40 mg Oral Daily Lavina Hamman, MD   40 mg at 02/09/21 1157  . sodium chloride flush (NS) 0.9 % injection 5 mL  5 mL Intracatheter Q8H Sandi Mariscal, MD   5 mL at 02/09/21 5361     Discharge Medications: Please see discharge summary for a list of discharge medications.  Relevant Imaging Results:  Relevant Lab Results:   Additional Information WE#[id number]  Purcell Mouton, RN

## 2021-02-09 NOTE — Progress Notes (Addendum)
Referring Physician(s): Dr. Ninfa Linden, D.   Supervising Physician: Arne Cleveland  Patient Status:  [facility] - In-pt  Chief Complaint:  S/p  percutaneous cholecystostomy placement with Dr. Pascal Lux on 5/3   Subjective:  Patient laying in bed, not in acute distress.  His wife at the bedside. Denies fever, chills, shortness of breath, abdominal pain, nausea, vomiting. Denies any pain around the drain site.  Allergies: Penicillins  Medications: Prior to Admission medications   Medication Sig Start Date End Date Taking? Authorizing Provider  aspirin EC 81 MG tablet Take 1 tablet (81 mg total) by mouth daily. Patient taking differently: Take 81 mg by mouth at bedtime. 11/04/17  Yes Lyda Jester M, PA-C  docusate sodium (COLACE) 100 MG capsule Take 2 capsules (200 mg total) by mouth 2 (two) times daily. 01/27/21  Yes Hosie Poisson, MD  furosemide (LASIX) 40 MG tablet Take 1 tablet (40 mg total) by mouth daily. 01/27/21  Yes Hosie Poisson, MD  isosorbide mononitrate (IMDUR) 30 MG 24 hr tablet Take 1 tablet (30 mg total) by mouth daily. 08/02/20  Yes Burnell Blanks, MD  Maltodextrin-Xanthan Gum ([place]) POWD Use with every meals. Patient taking differently: Take 4 g by mouth See admin instructions. Use with every meals. 01/27/21  Yes Hosie Poisson, MD  metFORMIN (GLUCOPHAGE) 1000 MG tablet Take 1,000 mg by mouth 2 (two) times daily.   Yes [provider]  metoprolol tartrate (LOPRESSOR) 25 MG tablet Take 5 tablets (125 mg total) by mouth 2 (two) times daily. 01/27/21  Yes Hosie Poisson, MD  Multiple Vitamin (MULTIVITAMIN WITH MINERALS) TABS tablet Take 1 tablet by mouth daily. 01/28/21  Yes Hosie Poisson, MD  QUEtiapine (SEROQUEL) 25 MG tablet Take 1 tablet (25 mg total) by mouth 2 (two) times daily. 01/27/21  Yes Hosie Poisson, MD  simvastatin (ZOCOR) 40 MG tablet Take 1 tablet (40 mg total) by mouth at bedtime. 11/04/17  Yes Lyda Jester M, PA-C   tamsulosin (FLOMAX) 0.4 MG CAPS capsule Take 1 capsule (0.4 mg total) by mouth daily. 01/28/21  Yes Hosie Poisson, MD  thiamine 100 MG tablet Take 1 tablet (100 mg total) by mouth daily. 01/28/21  Yes Hosie Poisson, MD  vitamin B-12 (CYANOCOBALAMIN) 1000 MCG tablet Take 1 tablet (1,000 mcg total) by mouth daily. 01/27/21  Yes Hosie Poisson, MD  nitroGLYCERIN (NITROSTAT) 0.4 MG SL tablet Place 1 tablet (0.4 mg total) under the tongue every 5 (five) minutes as needed for chest pain. Patient not taking: Reported on 02/06/2021 11/04/17   Lyda Jester M, PA-C  polyethylene glycol (MIRALAX / GLYCOLAX) 17 g packet Take 17 g by mouth daily as needed. Patient not taking: Reported on 02/06/2021 01/27/21   Hosie Poisson, MD  senna-docusate (SENOKOT-S) 8.6-50 MG tablet Take 1 tablet by mouth at bedtime as needed for mild constipation. Patient not taking: Reported on 02/06/2021 01/27/21   Hosie Poisson, MD     Vital Signs: BP 120/75 (BP Location: Right Arm)   Pulse 98   Temp 98.5 F (36.9 C) (Axillary)   Resp 18   Ht 5\' 8"  (1.727 m)   Wt 201 lb 11.5 oz (91.5 kg)   SpO2 94%   BMI 30.67 kg/m   Physical Exam Vitals reviewed.  Constitutional:      General: He is not in acute distress. HENT:     Head: Normocephalic and atraumatic.  Cardiovascular:     Rate and Rhythm: Normal rate.  Pulmonary:     Effort: Pulmonary  effort is normal.  Abdominal:     Palpations: Abdomen is soft.     Comments: Positive RUQ drain to a gravity bag. Site is unremarkable with no erythema, edema, tenderness, bleeding or drainage. Suture and stat lock in place. Dressing is clean, dry, and intact. Trace of bilious fluid noted in the gravity bag. Drain aspirates and flushes well.   Skin:    General: Skin is warm and dry.  Neurological:     Mental Status: He is alert and oriented to person, place, and time.  Psychiatric:        Mood and Affect: Mood normal.        Behavior: Behavior normal.     Imaging: MR ABDOMEN  MRCP WO CONTRAST  Result Date: 02/06/2021 CLINICAL DATA:  Jaundice. Gallbladder wall thickening and irregular gallbladder sludge on prior ultrasound. EXAM: MRI ABDOMEN WITHOUT CONTRAST  (INCLUDING MRCP) TECHNIQUE: Multiplanar multisequence MR imaging of the abdomen was performed. Heavily T2-weighted images of the biliary and pancreatic ducts were obtained, and three-dimensional MRCP images were rendered by post processing. COMPARISON:  CT 01/22/2021. Ultrasound 01/22/2021 and 02/06/2021. Nuclear medicine hepatobiliary scan 01/24/2021. FINDINGS: Despite efforts by the technologist and patient, mild motion artifact is present on today's exam and could not be eliminated. This reduces exam sensitivity and specificity. Lower chest: Trace bilateral pleural effusions with mild atelectasis at both lung bases. Hepatobiliary: No focal hepatic abnormalities are identified. The thin section MRCP images are motion degraded, but there is only mild extrahepatic biliary dilatation with the common hepatic duct measuring 8 mm maximally. No evidence of choledocholithiasis or intrahepatic biliary dilatation. Mild gallbladder wall thickening and irregular sludge within the gallbladder lumen are similar to the recent ultrasound. No discrete gallstones identified. Pancreas: Unremarkable. No pancreatic ductal dilatation or surrounding inflammatory changes. Spleen: Normal in size without focal abnormality. Adrenals/Urinary Tract: Both adrenal glands appear normal. There is a small cyst in the anterior interpolar region of the right kidney. The left kidney appears normal. No hydronephrosis. Stomach/Bowel: The stomach appears unremarkable for its degree of distension. No evidence of bowel wall thickening, distention or surrounding inflammatory change.Scattered colonic diverticulosis. Vascular/Lymphatic: There are no enlarged abdominal lymph nodes. Aortic atherosclerosis without aneurysm. Other: Supraumbilical hernia containing only fat  appears stable. No ascites. Musculoskeletal: No acute or significant osseous findings. Mild edema within the erector spinae musculature. IMPRESSION: 1. Minimal extrahepatic biliary dilatation without evidence of choledocholithiasis, pancreatic lesion or pancreatic ductal dilatation. 2. Persistent abnormal appearance of the gallbladder with wall thickening and heterogeneous sludge. As correlated with previous imaging, cholecystitis not excluded. 3. Scattered colonic diverticulosis without acute inflammation. 4. Stable supraumbilical hernia containing only fat. Electronically Signed   By: Richardean Sale M.D.   On: 02/06/2021 14:54   MR 3D Recon At Scanner  Result Date: 02/06/2021 CLINICAL DATA:  Jaundice. Gallbladder wall thickening and irregular gallbladder sludge on prior ultrasound. EXAM: MRI ABDOMEN WITHOUT CONTRAST  (INCLUDING MRCP) TECHNIQUE: Multiplanar multisequence MR imaging of the abdomen was performed. Heavily T2-weighted images of the biliary and pancreatic ducts were obtained, and three-dimensional MRCP images were rendered by post processing. COMPARISON:  CT 01/22/2021. Ultrasound 01/22/2021 and 02/06/2021. Nuclear medicine hepatobiliary scan 01/24/2021. FINDINGS: Despite efforts by the technologist and patient, mild motion artifact is present on today's exam and could not be eliminated. This reduces exam sensitivity and specificity. Lower chest: Trace bilateral pleural effusions with mild atelectasis at both lung bases. Hepatobiliary: No focal hepatic abnormalities are identified. The thin section MRCP images are motion degraded, but there  is only mild extrahepatic biliary dilatation with the common hepatic duct measuring 8 mm maximally. No evidence of choledocholithiasis or intrahepatic biliary dilatation. Mild gallbladder wall thickening and irregular sludge within the gallbladder lumen are similar to the recent ultrasound. No discrete gallstones identified. Pancreas: Unremarkable. No pancreatic  ductal dilatation or surrounding inflammatory changes. Spleen: Normal in size without focal abnormality. Adrenals/Urinary Tract: Both adrenal glands appear normal. There is a small cyst in the anterior interpolar region of the right kidney. The left kidney appears normal. No hydronephrosis. Stomach/Bowel: The stomach appears unremarkable for its degree of distension. No evidence of bowel wall thickening, distention or surrounding inflammatory change.Scattered colonic diverticulosis. Vascular/Lymphatic: There are no enlarged abdominal lymph nodes. Aortic atherosclerosis without aneurysm. Other: Supraumbilical hernia containing only fat appears stable. No ascites. Musculoskeletal: No acute or significant osseous findings. Mild edema within the erector spinae musculature. IMPRESSION: 1. Minimal extrahepatic biliary dilatation without evidence of choledocholithiasis, pancreatic lesion or pancreatic ductal dilatation. 2. Persistent abnormal appearance of the gallbladder with wall thickening and heterogeneous sludge. As correlated with previous imaging, cholecystitis not excluded. 3. Scattered colonic diverticulosis without acute inflammation. 4. Stable supraumbilical hernia containing only fat. Electronically Signed   By: Richardean Sale M.D.   On: 02/06/2021 14:54   US Abdomen Limited  Result Date: 02/06/2021 CLINICAL DATA:  AST abnormality. EXAM: ULTRASOUND ABDOMEN LIMITED RIGHT UPPER QUADRANT COMPARISON:  January 22, 2021 FINDINGS: Gallbladder: A large amount of heterogeneous sludge is seen within the gallbladder lumen. No gallstones are identified. The gallbladder wall measures 5.2 mm in thickness. No sonographic Murphy sign noted by sonographer. Common bile duct: Diameter: 7.6 mm Liver: No focal lesion identified. Diffusely increased echogenicity of the liver parenchyma is seen. Portal vein is patent on color Doppler imaging with normal direction of blood flow towards the liver. Other: None. IMPRESSION: 1.  Gallbladder sludge and gallbladder wall thickening without evidence of cholelithiasis. While this may represent sequelae associated with chronic cholecystitis, further evaluation with a nuclear medicine hepatobiliary scan is recommended. 2. Fatty liver. Electronically Signed   By: Virgina Norfolk M.D.   On: 02/06/2021 02:43   IR Perc Cholecystostomy  Result Date: 02/07/2021 INDICATION: Concern for acute cholecystitis. Poor operative candidate. Please perform image guided cholecystostomy tube placement for infection source control purposes. EXAM: ULTRASOUND AND FLUOROSCOPIC-GUIDED CHOLECYSTOSTOMY TUBE PLACEMENT COMPARISON:  MRCP-02/06/2021; right upper quadrant abdominal ultrasound-02/06/2021; nuclear medicine HIDA scan-01/24/2021; CT the chest, abdomen and pelvis-01/22/2021 MEDICATIONS: The patient is currently admitted to the hospital and on intravenous antibiotics. Antibiotics were administered within an appropriate time frame prior to skin puncture. ANESTHESIA/SEDATION: Moderate (conscious) sedation was employed during this procedure. Fentanyl 100 mcg was administered intravenously. Moderate Sedation Time: 14 minutes. The patient's level of consciousness and vital signs were monitored continuously by radiology nursing throughout the procedure under my direct supervision. CONTRAST:  103mL OMNIPAQUE IOHEXOL 300 MG/ML SOLN - administered into the gallbladder fossa. FLUOROSCOPY TIME:  1 minute, 36 seconds (92 mGy) COMPLICATIONS: None immediate. PROCEDURE: Informed written consent was obtained from the patient and the patient's family after a discussion of the risks, benefits and alternatives to treatment. Questions regarding the procedure were encouraged and answered. A timeout was performed prior to the initiation of the procedure. The right upper abdominal quadrant was prepped and draped in the usual sterile fashion, and a sterile drape was applied covering the operative field. Maximum barrier sterile technique  with sterile gowns and gloves were used for the procedure. A timeout was performed prior to the initiation of the procedure.  Local anesthesia was provided with 1% lidocaine with epinephrine. Ultrasound scanning of the right upper quadrant demonstrates a mildly dilated gallbladder with minimal amount of pericholecystic fluid. Utilizing a transhepatic approach, a 22 gauge needle was advanced into the gallbladder under direct ultrasound guidance. An ultrasound image was saved for documentation purposes. Appropriate intraluminal puncture was confirmed with the efflux of bile and advancement of an 0.018 wire into the gallbladder lumen. The needle was exchanged for an Accustick set. A small amount of contrast was injected to confirm appropriate intraluminal positioning. Over a Benson wire, a 10.2-French Cook cholecystomy tube was advanced into the gallbladder fossa, coiled and locked. Bile was aspirated and a small amount of contrast was injected as several post procedural spot radiographic images were obtained in various obliquities demonstrating passage of contrast through the cystic and common bile ducts to the level of the duodenum. The catheter was secured to the skin with suture, connected to a drainage bag and a dressing was placed. The patient tolerated the procedure well without immediate post procedural complication. IMPRESSION: 1. Successful ultrasound and fluoroscopic guided placement of a 10.2 French cholecystostomy tube. 2. Limited cholangiogram performed following cholecystostomy tube placement demonstrates patency of the cystic and common bile ducts with passage of contrast to the level of duodenum. Electronically Signed   By: Simonne ComeJohn  Watts M.D.   On: 02/07/2021 16:17   DG Chest Port 1 View  Result Date: 02/06/2021 CLINICAL DATA:  Slurred speech. EXAM: PORTABLE CHEST 1 VIEW COMPARISON:  January 21, 2021 FINDINGS: Decreased lung volumes are seen which is likely secondary to the degree of patient inspiration.  Very mild left basilar atelectasis is noted. There is no evidence of a pleural effusion or pneumothorax. The heart size and mediastinal contours are within normal limits. Mild calcification of the aortic arch is noted. The visualized skeletal structures are unremarkable. IMPRESSION: Very mild left basilar atelectasis. Electronically Signed   By: Aram Candelahaddeus  Houston M.D.   On: 02/06/2021 01:14    Labs:  CBC: Recent Labs    02/06/21 0510 02/07/21 0350 02/08/21 0326 02/09/21 0353  WBC 19.4* 10.3 11.0* 11.3*  HGB 10.3* 10.5* 11.0* 11.2*  HCT 31.0* 33.0* 33.4* 34.6*  PLT 229 253 284 291    COAGS: Recent Labs    02/05/21 0016 02/06/21 0510 02/07/21 0350 02/08/21 0326 02/09/21 0353  INR 1.2 1.3* 1.2 1.1 1.2  APTT 33 32  --   --   --     BMP: Recent Labs    02/06/21 0510 02/07/21 0350 02/08/21 0326 02/09/21 0353  NA 136 136 136 133*  K 3.5 3.4* 3.3* 3.8  CL 103 105 106 103  CO2 22 22 21* 19*  GLUCOSE 214* 111* 129* 114*  BUN 14 10 7* <5*  CALCIUM 8.7* 8.7* 8.8* 8.8*  CREATININE 0.63 0.56* 0.42* 0.52*  GFRNONAA >60 >60 >60 >60    LIVER FUNCTION TESTS: Recent Labs    02/06/21 0510 02/07/21 0350 02/08/21 0326 02/09/21 0353  BILITOT 6.1* 3.0* 2.0* 1.7*  AST 172* 110* 46* 22  ALT 132* 104* 76* 50*  ALKPHOS 341* 294* 256* 227*  PROT 5.5* 5.6* 5.6* 5.9*  ALBUMIN 2.3* 2.4* 2.4* 2.4*    Assessment and Plan:  70 y/o male admitted for septic shock due to acalculous cholecystitis. S/p percutaneous cholecystostomy placement with Dr. Grace IsaacWatts on 5/3.  Patient stable, has no abdominal complaints.  RUQ drain intact, flushes and aspirates well.  OP 500 cc overnight.  VSS, afebrile CBC stable LFT improving,  T bili trending down  Continue with flushing TID, output recording q shift and dressing changes as needed. Would consider additional imaging when output is less than 10 ml for 24 hours not including flush material.    Further treatment plan per [facility]/ [facility] Appreciate  and agree with the plan.  IR to follow.    Electronically Signed: Tera Mater, PA-C 02/09/2021, 2:30 PM   I spent a total of 15 Minutes at the the patient's bedside AND on the patient's hospital floor or unit, greater than 50% of which was counseling/coordinating care for perc choley drain.

## 2021-02-10 LAB — CBC WITH DIFFERENTIAL/PLATELET
Abs Immature Granulocytes: 0.23 10*3/uL — ABNORMAL HIGH (ref 0.00–0.07)
Basophils Absolute: 0.1 10*3/uL (ref 0.0–0.1)
Basophils Relative: 0 %
Eosinophils Absolute: 0.1 10*3/uL (ref 0.0–0.5)
Eosinophils Relative: 0 %
HCT: 35.8 % — ABNORMAL LOW (ref 39.0–52.0)
Hemoglobin: 11.3 g/dL — ABNORMAL LOW (ref 13.0–17.0)
Immature Granulocytes: 2 %
Lymphocytes Relative: 13 %
Lymphs Abs: 1.8 10*3/uL (ref 0.7–4.0)
MCH: 29.2 pg (ref 26.0–34.0)
MCHC: 31.6 g/dL (ref 30.0–36.0)
MCV: 92.5 fL (ref 80.0–100.0)
Monocytes Absolute: 1.2 10*3/uL — ABNORMAL HIGH (ref 0.1–1.0)
Monocytes Relative: 9 %
Neutro Abs: 10.1 10*3/uL — ABNORMAL HIGH (ref 1.7–7.7)
Neutrophils Relative %: 76 %
Platelets: 295 10*3/uL (ref 150–400)
RBC: 3.87 MIL/uL — ABNORMAL LOW (ref 4.22–5.81)
RDW: 14.3 % (ref 11.5–15.5)
WBC: 13.4 10*3/uL — ABNORMAL HIGH (ref 4.0–10.5)
nRBC: 0 % (ref 0.0–0.2)

## 2021-02-10 LAB — COMPREHENSIVE METABOLIC PANEL
ALT: 36 U/L (ref 0–44)
AST: 18 U/L (ref 15–41)
Albumin: 2.5 g/dL — ABNORMAL LOW (ref 3.5–5.0)
Alkaline Phosphatase: 185 U/L — ABNORMAL HIGH (ref 38–126)
Anion gap: 11 (ref 5–15)
BUN: 8 mg/dL (ref 8–23)
CO2: 20 mmol/L — ABNORMAL LOW (ref 22–32)
Calcium: 8.8 mg/dL — ABNORMAL LOW (ref 8.9–10.3)
Chloride: 103 mmol/L (ref 98–111)
Creatinine, Ser: 0.46 mg/dL — ABNORMAL LOW (ref 0.61–1.24)
GFR, Estimated: >60 mL/min (ref >60)
Glucose, Bld: 132 mg/dL — ABNORMAL HIGH (ref 70–99)
Potassium: 3.6 mmol/L (ref 3.5–5.1)
Sodium: 134 mmol/L — ABNORMAL LOW (ref 135–145)
Total Bilirubin: 1.2 mg/dL (ref 0.3–1.2)
Total Protein: 6 g/dL — ABNORMAL LOW (ref 6.5–8.1)

## 2021-02-10 LAB — GLUCOSE, CAPILLARY
Glucose-Capillary: 119 mg/dL — ABNORMAL HIGH (ref 70–99)
Glucose-Capillary: 121 mg/dL — ABNORMAL HIGH (ref 70–99)
Glucose-Capillary: 129 mg/dL — ABNORMAL HIGH (ref 70–99)
Glucose-Capillary: 131 mg/dL — ABNORMAL HIGH (ref 70–99)
Glucose-Capillary: 185 mg/dL — ABNORMAL HIGH (ref 70–99)
Glucose-Capillary: 196 mg/dL — ABNORMAL HIGH (ref 70–99)

## 2021-02-10 LAB — PROTIME-INR
INR: 1.2 (ref 0.8–1.2)
Prothrombin Time: 15.3 seconds — ABNORMAL HIGH (ref 11.4–15.2)

## 2021-02-10 MED ORDER — METRONIDAZOLE 500 MG PO TABS
500.0000 mg | ORAL_TABLET | Freq: Three times a day (TID) | ORAL | Status: DC
  Administered 2021-02-10 – 2021-02-11 (×4): 500 mg via ORAL
  Filled 2021-02-10 (×4): qty 1

## 2021-02-10 MED ORDER — INSULIN ASPART 100 UNIT/ML IJ SOLN: 0.0000 [IU] | Freq: Every day | INTRAMUSCULAR | Status: DC

## 2021-02-10 MED ORDER — CEFDINIR 300 MG PO CAPS
300.0000 mg | ORAL_CAPSULE | Freq: Two times a day (BID) | ORAL | Status: DC
  Administered 2021-02-10 – 2021-02-11 (×3): 300 mg via ORAL
  Filled 2021-02-10 (×4): qty 1

## 2021-02-10 MED ORDER — INSULIN ASPART 100 UNIT/ML IJ SOLN
0.0000 [IU] | Freq: Three times a day (TID) | INTRAMUSCULAR | Status: DC
  Administered 2021-02-10: 3 [IU] via SUBCUTANEOUS
  Administered 2021-02-10: 2 [IU] via SUBCUTANEOUS
  Administered 2021-02-11: 5 [IU] via SUBCUTANEOUS
  Administered 2021-02-11: 2 [IU] via SUBCUTANEOUS

## 2021-02-10 NOTE — Progress Notes (Signed)
Shift Summary:   Uneventful shift.. Remained alert to self, family, 100 ccs from biliary drain emptied. Given PRN pain medication during this shift x 2. Patient was up in the chair the majority of the shift, returned back to bed with steady. Family visited during this shift. No other needs identified. Will continue to monitor.

## 2021-02-10 NOTE — Progress Notes (Signed)
Subjective/Chief Complaint: No complaints this morning Denies abdominal pain   Objective: Vital signs in last 24 hours: Temp:  [98.5 F (36.9 C)-98.7 F (37.1 C)] 98.7 F (37.1 C) (05/06 0426) Pulse Rate:  [100-105] 105 (05/06 0426) Resp:  [18-20] 20 (05/05 2029) BP: (121-131)/(62-81) 131/81 (05/06 0426) SpO2:  [94 %-100 %] 97 % (05/06 0426) Weight:  [91.6 kg] 91.6 kg (05/06 0450) Last BM Date: 02/09/20  Intake/Output from previous day: 05/05 0701 - 05/06 0700 In: 4193 [P.O.:300; IV Piggyback:1400] Out: 3155 [Urine:3000; Drains:155] Intake/Output this shift: No intake/output data recorded.  Exam: Awake, appears comfortable Abdomen soft, NT, perc chole drain with bile, working well  Lab Results:  Recent Labs    02/09/21 0353 02/10/21 0324  WBC 11.3* 13.4*  HGB 11.2* 11.3*  HCT 34.6* 35.8*  PLT 291 295   BMET Recent Labs    02/09/21 0353 02/10/21 0324  NA 133* 134*  K 3.8 3.6  CL 103 103  CO2 19* 20*  GLUCOSE 114* 132*  BUN <5* 8  CREATININE 0.52* 0.46*  CALCIUM 8.8* 8.8*   PT/INR Recent Labs    02/09/21 0353 02/10/21 0324  LABPROT 15.3* 15.3*  INR 1.2 1.2   ABG No results for input(s): PHART, HCO3 in the last 72 hours.  Invalid input(s): PCO2, PO2  Studies/Results: No results found.  Anti-infectives: Anti-infectives (From admission, onward)   Start     Dose/Rate Route Frequency Ordered Stop   02/10/21 1400  metroNIDAZOLE (FLAGYL) tablet 500 mg        500 mg Oral Every 8 hours 02/10/21 0751     02/10/21 1000  cefdinir (OMNICEF) capsule 300 mg        300 mg Oral Every 12 hours 02/10/21 0751     02/07/21 0100  vancomycin (VANCOREADY) IVPB 2000 mg/400 mL  Status:  Discontinued        2,000 mg 200 mL/hr over 120 Minutes Intravenous Every 24 hours 02/06/21 0316 02/07/21 1154   02/06/21 2000  ceFEPIme (MAXIPIME) 2 g in sodium chloride 0.9 % 100 mL IVPB  Status:  Discontinued        2 g 200 mL/hr over 30 Minutes Intravenous Every 8 hours  02/06/21 1202 02/10/21 0751   02/06/21 1000  aztreonam (AZACTAM) 1 g in sodium chloride 0.9 % 100 mL IVPB  Status:  Discontinued        1 g 200 mL/hr over 30 Minutes Intravenous Every 8 hours 02/06/21 0248 02/06/21 1202   02/06/21 0400  metroNIDAZOLE (FLAGYL) IVPB 500 mg  Status:  Discontinued        500 mg 100 mL/hr over 60 Minutes Intravenous Every 8 hours 02/06/21 0248 02/10/21 0751   02/06/21 0000  vancomycin (VANCOREADY) IVPB 2000 mg/400 mL        2,000 mg 200 mL/hr over 120 Minutes Intravenous  Once 02/05/21 2348 02/06/21 0511   02/05/21 2345  aztreonam (AZACTAM) 2 g in sodium chloride 0.9 % 100 mL IVPB        2 g 200 mL/hr over 30 Minutes Intravenous  Once 02/05/21 2336 02/06/21 0101   02/05/21 2345  metroNIDAZOLE (FLAGYL) IVPB 500 mg  Status:  Discontinued        500 mg 100 mL/hr over 60 Minutes Intravenous  Once 02/05/21 2336 02/06/21 0256   02/05/21 2345  vancomycin (VANCOCIN) IVPB 1000 mg/200 mL premix  Status:  Discontinued        1,000 mg 200 mL/hr over 60 Minutes Intravenous  Once 02/05/21 2336 02/05/21 2348      Assessment/Plan: Chronic systolic and diastolic heart failure CAD-s/p PCI to LAD in 2007 HTN HLD DM Obesity Chronic pain Supraumbilical hernia - contains only fat on imaging. NT on exam.  Recenthospitalization 4/7-4/22/2035for AMSwhich was attributed to B12 deficiency, incidental small right parietal strokeand polypharmacy  Acute on Chronic Cholecystitis Elevated LFT's - MRCP negative for choledocholithiasis. Could have passed a stone. LFT's downtrending. T. Bili now normal after drain -s/p Perc Chole drain placement by Dr. Pascal Lux of IR on 5/4.  - Adv diet. If tolerates, patient can be discharged with 14 days of abx from our standpoint -[place] likely need cholecystostomy tube for at least 6 weeks and will need cardiac clearance before interval cholecystectomy   We will see PRN for the weekend    LOS: 4 days    Coralie Keens 02/10/2021

## 2021-02-10 NOTE — Progress Notes (Signed)
Attempted to get patient up out of the bed to the chair, patient was only able to stand for 3 seconds before needing to sit down to catch breath, we attempted this 3 times, patient endorses being to weak to pivot to the chair. Patient was able to sit on the side of the bed, it took this nurse and care partner to get patient back assisted in bed. After returning back to bed, sheets, bottom pads, and complete bed bath was given. No other needs identified. Will continue to monitor.

## 2021-02-10 NOTE — Progress Notes (Signed)
Physical Therapy Treatment Patient Details Name: Vincent [name] MRN: [id number] DOB: 22-Jan-1951 Today's Date: 02/10/2021    History of Present Illness Vincent [name] is a 70 y/o M with a PMH CAD , CHF, DM2, chronic incarcerated fat containing umbilical hernia,  and chronic pain who presented to the hospital 02/05/21 from SNF with AMS. ED workup revealed hypotension (94/57), tachypnea (RR 33), WBC 25, lactic acid 2.2 (now normal),  Elevated LFTs (AST 219, ALT 153, Alk Phos 403, t.bili 6.2) and RUQ U/S showing gallbladder sludge, wall thickening, and 7.6 mm CBD. S/P IR perc chole drain placed 02/07/21.    PT Comments    Pt agreeable to mobilize.  Pt had his sister and niece visiting and appeared motivated to improve strength.  Utilized stedy to block pt's knees and assist pt to recliner.  Pt requiring increased assist so continue to recommend return to SNF at this time.    Follow Up Recommendations  SNF     Equipment Recommendations  None recommended by PT    Recommendations for Other Services       Precautions / Restrictions Precautions Precautions: Fall Precaution Comments: Rt drain    Mobility  Bed Mobility Overal bed mobility: Needs Assistance Bed Mobility: Supine to Sit     Supine to sit: Max assist;HOB elevated;+2 for safety/equipment     General bed mobility comments: attempted to have pt assist with multimodal cues however pt minimally moving and requesting assist,  utilized bed pads for positioning    Transfers Overall transfer level: Needs assistance   Transfers: Sit to/from Stand Sit to Stand: +2 physical assistance;Mod assist;From elevated surface;+2 safety/equipment         General transfer comment: pt with difficulty standing with nursing earlier so recommended stedy for pt to try and pt agreeable, pt requiring assist rise and fatigues very quickly, multimodal cues for self assist and hand placement, performed sit to stand x3  Ambulation/Gait                  Stairs             Wheelchair Mobility    Modified Rankin (Stroke Patients Only)       Balance Overall balance assessment: Needs assistance Sitting-balance support: Bilateral upper extremity supported;Feet supported Sitting balance-Leahy Scale: Poor Sitting balance - Comments: reliant on UE support   Standing balance support: Bilateral upper extremity supported Standing balance-Leahy Scale: Zero                              Cognition Arousal/Alertness: Awake/alert Behavior During Therapy: Anxious Overall Cognitive Status: History of cognitive impairments - at baseline Area of Impairment: Orientation;Attention;Following commands;Safety/judgement                   Current Attention Level: Sustained   Following Commands: Follows one step commands consistently Safety/Judgement: Decreased awareness of safety;Decreased awareness of deficits Awareness: Intellectual Problem Solving: Slow processing;Difficulty sequencing;Requires verbal cues        Exercises      General Comments        Pertinent Vitals/Pain Pain Assessment: Faces Faces Pain Scale: Hurts little more Pain Location: right side - drain site Pain Descriptors / Indicators: Sore;Discomfort Pain Intervention(s): Monitored during session;Repositioned    Home Living                      Prior Function  PT Goals (current goals can now be found in the care plan section) Progress towards PT goals: Progressing toward goals    Frequency    Min 2X/week      PT Plan Current plan remains appropriate    Co-evaluation              AM-PAC PT "6 Clicks" Mobility   Outcome Measure  Help needed turning from your back to your side while in a flat bed without using bedrails?: A Lot Help needed moving from lying on your back to sitting on the side of a flat bed without using bedrails?: A Lot Help needed moving to and from a bed to a chair (including a  wheelchair)?: Total Help needed standing up from a chair using your arms (e.g., wheelchair or bedside chair)?: Total Help needed to walk in hospital room?: Total Help needed climbing 3-5 steps with a railing? : Total 6 Click Score: 8    End of Session   Activity Tolerance: Patient tolerated treatment well Patient left: in chair;with call bell/phone within reach;with chair alarm set Nurse Communication: Need for lift equipment PT Visit Diagnosis: Muscle weakness (generalized) (M62.81);Other abnormalities of gait and mobility (R26.89)     Time: 1342-1400 PT Time Calculation (min) (ACUTE ONLY): 18 min  Charges:  $Therapeutic Activity: 8-22 mins                     Jannette Spanner PT, DPT Acute Rehabilitation Services Pager: [phone] Office: [phone]  York Ram E 02/10/2021, 3:31 PM

## 2021-02-10 NOTE — Progress Notes (Signed)
[facility] Progress Note  Patient: Vincent [name]    LOV:[id number]  DOA: 02/05/2021     Date of Service: the patient was seen and examined on 02/10/2021  Brief hospital course: Past medical history of CAD, HTN, HLD, depression, hard of hearing, arthritis, CVA.  Presents with complaints of abdominal pain with shortness of breath.  Found to have acute on chronic cholecystitis. General surgery consulted.  SP percutaneous cholecystostomy drain placement. Currently plan is monitor for improvement in tolerance of oral diet.  Assessment and Plan: 1.  Severe sepsis, POA Acute on chronic cholecystitis Elevated LFT Possible UTI Tachycardic, tachypneic, febrile on presentation with leukocytosis. Concern for acute on chronic cholecystitis as well as UTI. Urine with 50,000 E. coli. General surgery consulted GI consult. MRCP done on 5/2 shows known choledocholithiasis. Underwent PERC cholecystostomy drain placement by IR on 5/3. Currently on IV cefepime and Flagyl. Patient was on IV fluid as well. LFT now trending down. Blood cultures so far no growth.  Also no growth on drain fluid. Currently diet is advanced.  If tolerates over last 24 hours patient will be medically ready to be discharged from surgical perspective.  14 days of antibiotic recommended for the day of surgery.  2.  Acute metabolic encephalopathy, POA Improving.  Back to baseline.  Monitor.  3.  CAD SP PCI Chronic combined systolic and diastolic CHF HTN HLD History of CVA Currently no acute complaint. Aspirin on hold can likely resume.  Will clarify with surgery Currently Imdur, Lopressor, Lasix on hold. Statin on hold due to elevated LFT. Resume when possible.  4.  Marijuana abuse UDS positive for marijuana. Recommend to quit.  5.  Obesity Placing the patient at high risk for poor outcome. Body mass index is 30.71 kg/m.   6.  Mid sacral ulcer. POA. Unstageable. Continue dressing changes.  Frequent  turning.  Pressure Injury 02/06/21 Sacrum Mid Unstageable - Full thickness tissue loss in which the base of the injury is covered by slough (yellow, tan, gray, green or brown) and/or eschar (tan, brown or black) in the wound bed. (Active)  02/06/21 0636  Location: Sacrum  Location Orientation: Mid  Staging: Unstageable - Full thickness tissue loss in which the base of the injury is covered by slough (yellow, tan, gray, green or brown) and/or eschar (tan, brown or black) in the wound bed.  Wound Description (Comments):   Present on Admission: Yes   7.  Deconditioning. Patient severely deconditioned from chronic cholecystitis and presentation with severe sepsis. Recently hospitalized and discharged on 4/22 at which time functional status was much worse than right now. Currently needing max assist to sit at the edge of the bed.  Motivated for therapy. Patient appears to be ideal candidate for further aggressive therapy. Currently requires also drain care for cholecystostomy.  Diet: Soft diet DVT Prophylaxis:   enoxaparin (LOVENOX) injection 40 mg Start: 02/08/21 1000 SCDs Start: 02/06/21 0425    Advance goals of care discussion: Full code  Family Communication: no family was present at bedside, at the time of interview.  The pt provided permission to discuss medical plan with the family. Discussed with wife on the phone on 5/5. Opportunity was given to ask question and all questions were answered satisfactorily.   Disposition:  Status is: Inpatient  Remains inpatient appropriate because:IV treatments appropriate due to intensity of illness or inability to take PO  Dispo: The patient is from: ALF  Anticipated d/c is to: SNF              Patient currently is medically stable to d/c.   Difficult to place patient No  Subjective: No acute event.  Tolerating oral diet.  No nausea no vomiting.  Physical Exam:  General: Appear in mild distress, no Rash; Oral Mucosa Clear,  moist. no Abnormal Neck Mass Or lumps, Conjunctiva normal  Cardiovascular: S1 and S2 Present, no Murmur, Respiratory: good respiratory effort, Bilateral Air entry present and CTA, no Crackles, no wheezes Abdomen: Bowel Sound present, Soft and no tenderness Extremities: no Pedal edema Neurology: alert and oriented to time, place, and person affect appropriate. no new focal deficit Gait not checked due to patient safety concerns  Vitals:   02/09/21 2029 02/10/21 0426 02/10/21 0450 02/10/21 1430  BP: 121/71 131/81  (!) 151/90  Pulse: (!) 105 (!) 105  (!) 126  Resp: 20   18  Temp: 98.7 F (37.1 C) 98.7 F (37.1 C)  98.2 F (36.8 C)  TempSrc: Oral Oral  Oral  SpO2: 94% 97%  95%  Weight:   91.6 kg   Height:        Intake/Output Summary (Last 24 hours) at 02/10/2021 1854 Last data filed at 02/10/2021 1637 Gross per 24 hour  Intake 1735.03 ml  Output 1200 ml  Net 535.03 ml   Filed Weights   02/08/21 0500 02/09/21 0418 02/10/21 0450  Weight: 105.1 kg 91.5 kg 91.6 kg    Data Reviewed: I have personally reviewed and interpreted daily labs, tele strips, imaging. I reviewed all nursing notes, pharmacy notes, vitals, pertinent old records I have discussed plan of care as described above with RN and patient/family.  CBC: Recent Labs  Lab 02/05/21 0016 02/06/21 0510 02/07/21 0350 02/08/21 0326 02/09/21 0353 02/10/21 0324  WBC 25.1* 19.4* 10.3 11.0* 11.3* 13.4*  NEUTROABS 21.4*  --  7.5 8.8* 8.5* 10.1*  HGB 12.0* 10.3* 10.5* 11.0* 11.2* 11.3*  HCT 35.4* 31.0* 33.0* 33.4* 34.6* 35.8*  MCV 89.2 90.4 93.2 91.0 91.8 92.5  PLT 280 229 253 284 291 841   Basic Metabolic Panel: Recent Labs  Lab 02/06/21 0510 02/07/21 0350 02/08/21 0326 02/09/21 0353 02/10/21 0324  NA 136 136 136 133* 134*  K 3.5 3.4* 3.3* 3.8 3.6  CL 103 105 106 103 103  CO2 22 22 21* 19* 20*  GLUCOSE 214* 111* 129* 114* 132*  BUN 14 10 7* <5* 8  CREATININE 0.63 0.56* 0.42* 0.52* 0.46*  CALCIUM 8.7* 8.7*  8.8* 8.8* 8.8*    Studies: No results found.  Scheduled Meds: . cefdinir  300 mg Oral Q12H  . enoxaparin (LOVENOX) injection  40 mg Subcutaneous Q24H  . insulin aspart  0-15 Units Subcutaneous TID WC  . insulin aspart  0-5 Units Subcutaneous QHS  . metroNIDAZOLE  500 mg Oral Q8H  . pantoprazole  40 mg Oral Daily  . sodium chloride flush  5 mL Intracatheter Q8H   Continuous Infusions:  PRN Meds: acetaminophen, morphine injection, ondansetron (ZOFRAN) IV, oxyCODONE  Time spent: 35 minutes  Author: Berle Mull, MD [facility] 02/10/2021 6:54 PM  To reach On-call, see care teams to locate the attending and reach out via [email or web address]. Between 7PM-7AM, please contact night-coverage If you still have difficulty reaching the attending provider, please page the [facility] (Director on Call) for [facility] on amion for assistance.

## 2021-02-10 NOTE — TOC Progression Note (Signed)
Transition of Care [facility]) - Progression Note    Patient Details  Name: Vincent [name] MRN: [id number] Date of Birth: 29-Jul-1951  Transition of Care [facility]) CM/SW Contact  Ross Ludwig, [place] Phone Number: 02/10/2021, 5:03 PM  Clinical Narrative:     CSW received phone call from [facility], that patient has been approved for SNF for 7 days auth number [id number] and EMS transport has been approved [id number].  Patient will need palliative to follow at SNF, CSW spoke to patient's wife, and she was not sure which facility patient will choose.  CSW informed her so far there is only one offer, [facility] in [place], and she does not want him to go there.  Patient waiting for other bed offers.     Expected Discharge Plan: Home/Self Care Barriers to Discharge: No Barriers Identified  Expected Discharge Plan and Services Expected Discharge Plan: Home/Self Care       Living arrangements for the past 2 months: Single Family Home                                       Social Determinants of Health (SDOH) Interventions    Readmission Risk Interventions No flowsheet data found.

## 2021-02-11 DIAGNOSIS — M159 Polyosteoarthritis, unspecified: Secondary | ICD-10-CM | POA: Diagnosis not present

## 2021-02-11 DIAGNOSIS — F411 Generalized anxiety disorder: Secondary | ICD-10-CM | POA: Diagnosis not present

## 2021-02-11 DIAGNOSIS — R262 Difficulty in walking, not elsewhere classified: Secondary | ICD-10-CM | POA: Diagnosis not present

## 2021-02-11 DIAGNOSIS — M25562 Pain in left knee: Secondary | ICD-10-CM | POA: Diagnosis not present

## 2021-02-11 DIAGNOSIS — L24A2 Irritant contact dermatitis due to fecal, urinary or dual incontinence: Secondary | ICD-10-CM | POA: Diagnosis not present

## 2021-02-11 DIAGNOSIS — N3946 Mixed incontinence: Secondary | ICD-10-CM | POA: Diagnosis not present

## 2021-02-11 DIAGNOSIS — R410 Disorientation, unspecified: Secondary | ICD-10-CM | POA: Diagnosis not present

## 2021-02-11 DIAGNOSIS — I25118 Atherosclerotic heart disease of native coronary artery with other forms of angina pectoris: Secondary | ICD-10-CM | POA: Diagnosis not present

## 2021-02-11 DIAGNOSIS — A419 Sepsis, unspecified organism: Secondary | ICD-10-CM | POA: Diagnosis not present

## 2021-02-11 DIAGNOSIS — L22 Diaper dermatitis: Secondary | ICD-10-CM | POA: Diagnosis not present

## 2021-02-11 DIAGNOSIS — Z23 Encounter for immunization: Secondary | ICD-10-CM | POA: Diagnosis present

## 2021-02-11 DIAGNOSIS — Z8673 Personal history of transient ischemic attack (TIA), and cerebral infarction without residual deficits: Secondary | ICD-10-CM | POA: Diagnosis not present

## 2021-02-11 DIAGNOSIS — E119 Type 2 diabetes mellitus without complications: Secondary | ICD-10-CM | POA: Diagnosis not present

## 2021-02-11 DIAGNOSIS — K812 Acute cholecystitis with chronic cholecystitis: Secondary | ICD-10-CM | POA: Diagnosis not present

## 2021-02-11 DIAGNOSIS — L89159 Pressure ulcer of sacral region, unspecified stage: Secondary | ICD-10-CM | POA: Diagnosis not present

## 2021-02-11 DIAGNOSIS — F1210 Cannabis abuse, uncomplicated: Secondary | ICD-10-CM | POA: Diagnosis not present

## 2021-02-11 DIAGNOSIS — R6521 Severe sepsis with septic shock: Secondary | ICD-10-CM | POA: Diagnosis not present

## 2021-02-11 DIAGNOSIS — E669 Obesity, unspecified: Secondary | ICD-10-CM | POA: Diagnosis not present

## 2021-02-11 DIAGNOSIS — G894 Chronic pain syndrome: Secondary | ICD-10-CM | POA: Diagnosis not present

## 2021-02-11 DIAGNOSIS — Z515 Encounter for palliative care: Secondary | ICD-10-CM | POA: Diagnosis not present

## 2021-02-11 DIAGNOSIS — E785 Hyperlipidemia, unspecified: Secondary | ICD-10-CM | POA: Diagnosis not present

## 2021-02-11 DIAGNOSIS — R339 Retention of urine, unspecified: Secondary | ICD-10-CM | POA: Diagnosis not present

## 2021-02-11 DIAGNOSIS — M6281 Muscle weakness (generalized): Secondary | ICD-10-CM | POA: Diagnosis not present

## 2021-02-11 DIAGNOSIS — Z48815 Encounter for surgical aftercare following surgery on the digestive system: Secondary | ICD-10-CM | POA: Diagnosis not present

## 2021-02-11 DIAGNOSIS — Z9049 Acquired absence of other specified parts of digestive tract: Secondary | ICD-10-CM | POA: Diagnosis not present

## 2021-02-11 DIAGNOSIS — I5042 Chronic combined systolic (congestive) and diastolic (congestive) heart failure: Secondary | ICD-10-CM | POA: Diagnosis not present

## 2021-02-11 DIAGNOSIS — L89153 Pressure ulcer of sacral region, stage 3: Secondary | ICD-10-CM | POA: Diagnosis not present

## 2021-02-11 DIAGNOSIS — E139 Other specified diabetes mellitus without complications: Secondary | ICD-10-CM | POA: Diagnosis not present

## 2021-02-11 DIAGNOSIS — E1169 Type 2 diabetes mellitus with other specified complication: Secondary | ICD-10-CM | POA: Diagnosis not present

## 2021-02-11 DIAGNOSIS — E538 Deficiency of other specified B group vitamins: Secondary | ICD-10-CM | POA: Diagnosis not present

## 2021-02-11 DIAGNOSIS — K811 Chronic cholecystitis: Secondary | ICD-10-CM | POA: Diagnosis not present

## 2021-02-11 DIAGNOSIS — I2510 Atherosclerotic heart disease of native coronary artery without angina pectoris: Secondary | ICD-10-CM | POA: Diagnosis not present

## 2021-02-11 DIAGNOSIS — I10 Essential (primary) hypertension: Secondary | ICD-10-CM | POA: Diagnosis not present

## 2021-02-11 DIAGNOSIS — G4701 Insomnia due to medical condition: Secondary | ICD-10-CM | POA: Diagnosis not present

## 2021-02-11 DIAGNOSIS — R52 Pain, unspecified: Secondary | ICD-10-CM | POA: Diagnosis not present

## 2021-02-11 DIAGNOSIS — F331 Major depressive disorder, recurrent, moderate: Secondary | ICD-10-CM | POA: Diagnosis not present

## 2021-02-11 DIAGNOSIS — G4700 Insomnia, unspecified: Secondary | ICD-10-CM | POA: Diagnosis not present

## 2021-02-11 LAB — CULTURE, BLOOD (ROUTINE X 2)
Culture: NO GROWTH
Culture: NO GROWTH
Special Requests: ADEQUATE
Special Requests: ADEQUATE

## 2021-02-11 LAB — RESP PANEL BY RT-PCR (FLU A&B, COVID) ARPGX2
Influenza A by PCR: NEGATIVE
Influenza B by PCR: NEGATIVE
SARS Coronavirus 2 by RT PCR: NEGATIVE

## 2021-02-11 LAB — GLUCOSE, CAPILLARY
Glucose-Capillary: 149 mg/dL — ABNORMAL HIGH (ref 70–99)
Glucose-Capillary: 206 mg/dL — ABNORMAL HIGH (ref 70–99)
Glucose-Capillary: 115 mg/dL — ABNORMAL HIGH (ref 70–99)

## 2021-02-11 MED ORDER — FUROSEMIDE 40 MG PO TABS: 40.0000 mg | ORAL_TABLET | Freq: Every day | ORAL | 3 refills | Status: DC

## 2021-02-11 MED ORDER — TRAMADOL HCL 50 MG PO TABS: 50.0000 mg | ORAL_TABLET | Freq: Four times a day (QID) | ORAL | 0 refills | Status: AC | PRN

## 2021-02-11 MED ORDER — CEFDINIR 300 MG PO CAPS: 300.0000 mg | ORAL_CAPSULE | Freq: Two times a day (BID) | ORAL | 0 refills | Status: AC

## 2021-02-11 MED ORDER — METRONIDAZOLE 500 MG PO TABS: 500.0000 mg | ORAL_TABLET | Freq: Three times a day (TID) | ORAL | 0 refills | Status: AC

## 2021-02-11 MED ORDER — COVID-19 MRNA VACC (MODERNA) 100 MCG/0.5ML IM SUSP
0.5000 mL | Freq: Once | INTRAMUSCULAR | Status: AC
  Administered 2021-02-11: 0.5 mL via INTRAMUSCULAR
  Filled 2021-02-11: qty 0.5

## 2021-02-11 MED ORDER — PANTOPRAZOLE SODIUM 40 MG PO TBEC
40.0000 mg | DELAYED_RELEASE_TABLET | Freq: Every day | ORAL | 0 refills | Status: AC
Start: 2021-02-12 — End: ?

## 2021-02-11 NOTE — Progress Notes (Signed)
Attempted to call and give report to [facility]. Nurse unable to receive call at this time. Called and checked with [facility] on update to see where in line patient was in line, per [facility] [employer], "they could not provide this update, with severe weather, transport could be diverted."

## 2021-02-11 NOTE — Discharge Summary (Signed)
[facility] Discharge Summary   Patient: Vincent [name] [id number]  PCP: Ginger Organ., MD  Date of admission: 02/05/2021   Date of discharge:  02/11/2021     Discharge Diagnoses:  Principal Problem:   Severe sepsis with septic shock [facility]) Active Problems:   Coronary atherosclerosis   Diabetes 1.5, managed as type 2 ([place])   Chronic cholecystitis   Sacral decubitus ulcer   Severe sepsis with septic shock (CODE) ([place])  Admitted From: SNF Disposition:  SNF   Recommendations for Outpatient Follow-up:  1. PCP: please follow up in 1 week 2. Follow up with Cardiology in 1 month for preop clearance 3. Follow up with General surgery as recommended  4. Follow up with IR for drain management 5. Follow up LABS/TEST:  none 6. New Meds: Omnicef and Flagyl for 12 more days.  Protonix daily. 7. Changed meds: none 8. Stopped meds: Imdur, metoprolol, nitroglycerin, MiraLAX, Senokot.   Contact information for follow-up providers    [place] Follow up.   Why: For follow up of your drain Contact information: [address] [place] [address] [phone]        [facility] Follow up.   Why: For cardiac clearance  Contact information: [place] [phone]       Coralie Keens, MD Follow up.   Specialty: General Surgery Why: After you have seen cardiology and interventional radiology  Contact information: [address] [place] [address] [phone]        Ginger Organ., MD Follow up.   Specialty: Internal Medicine Contact information: [place] [address] [phone]        Burnell Blanks, MD .   Specialty: Cardiology Contact information: [address] [place]  [address] [phone]            Contact information for after-discharge care     Destination    [facility] Preferred SNF .   Service: Skilled Nursing Contact information: [address] [place] [phone]                 Discharge Instructions    Diet - low sodium heart healthy   Complete by: As directed    Discharge wound care:   Complete by: As directed    State 3 coccyx ulcer Cleanse with NS, pat gently dry. Cover with size appropriate piece of silver hydrofiber (Aquacel Ag+ Advantage) Top with silicone foam dressing for the sacrum with the "tip" oriented away from the anus.  Change silver dressing daily. May reuse the silicone foam up to 3 days, change PRN soiling.   Increase activity slowly   Complete by: As directed       Diet recommendation: Cardiac diet  Activity: The patient is advised to gradually reintroduce usual activities, as tolerated  Discharge Condition: stable  Code Status: Full code   History of present illness: As per the H and P dictated on admission, "Vincent [name] is a 70 y.o. male seen in ed with complaints of AMS. No family at bedside.HPI is per chart review and Nursing notes as pt is encephalopathic. Pt is DNR and has most form.  Pt was d/c from hospital on 01/12/2021 to SNF for AMS attributed to B12 def,and polypharmacy, also in previus stay pt has HIDA and attributed his fever and leucocytosis to chronic cholecystitis,  with gen surg rec no further intervention at that time.   Pt has past medical history of AMS, Chronic cholecystitis, C/H combined Sys / diastolic CHF,CAD s/p pci to lad, DM II."  Hospital Course:  Summary of his active problems in the hospital is as following. 1.  Severe sepsis, POA Acute on chronic cholecystitis Elevated LFT Possible UTI Tachycardic, tachypneic, febrile on presentation with leukocytosis. Concern for acute on chronic cholecystitis as well as UTI. Urine with 50,000 E. coli. General surgery consulted GI consult. MRCP done on 5/2 shows known  choledocholithiasis. Underwent PERC cholecystostomy drain placement by IR on 5/3. Currently on IV cefepime and Flagyl. Patient was on IV fluid as well. LFT now trending down. Blood cultures so far no growth.  Also no growth on drain fluid. Currently tolerating soft diet 14 days of antibiotic recommended for the day of surgery.  2.  Acute metabolic encephalopathy, POA Improving.  Back to baseline.  Monitor.  3.  CAD SP PCI Chronic combined systolic and diastolic CHF HTN HLD History of CVA Currently no acute complaint. Aspirin on hold, resume.  Currently Imdur, Lopressor, Lasix on hold. Resume lasix Statin on hold due to elevated LFT. Resume.  4.  Marijuana abuse UDS positive for marijuana. Recommend to quit.  5.  Obesity Placing the patient at high risk for poor outcome.  6.  Mid sacral ulcer. POA. Unstageable. Continue dressing changes.  Frequent turning.  Body mass index is 30.6 kg/m.    Nutrition Interventions:    Pressure Injury 02/06/21 Sacrum Mid Unstageable - Full thickness tissue loss in which the base of the injury is covered by slough (yellow, tan, gray, green or brown) and/or eschar (tan, brown or black) in the wound bed. (Active)  02/06/21 0636  Location: Sacrum  Location Orientation: Mid  Staging: Unstageable - Full thickness tissue loss in which the base of the injury is covered by slough (yellow, tan, gray, green or brown) and/or eschar (tan, brown or black) in the wound bed.  Wound Description (Comments):   Present on Admission: Yes     Pain control  - [place] Controlled Substance Reporting System database was reviewed. - 5 day supply was provided. - Patient was instructed, not to drive, operate heavy machinery, perform activities at heights, swimming or participation in water activities or provide baby sitting services while on Pain, Sleep and Anxiety Medications; until his outpatient Physician has advised to do so again.  - Also  recommended to not to take more than prescribed Pain, Sleep and Anxiety Medications.  Patient was seen by physical therapy, who recommended SNF. On the day of the discharge the patient's vitals were stable, and no other acute medical condition were reported by patient. The patient was felt safe to be discharge at SNF with Therapy.  Consultants: General surgery  Radiology  Gastroenterology  Procedures: IR guided percutaneous cholecystostomy  DISCHARGE MEDICATION: Allergies as of 02/11/2021      Reactions   Penicillins Other (See Comments)   Disputed in 2022- Has tolerated cephalexin in the past  Has patient had a PCN reaction causing immediate rash, facial/tongue/throat swelling, SOB or lightheadedness with hypotension: Unknown Has patient had a PCN reaction causing severe rash involving mucus membranes or skin necrosis: Unknown Has patient had a PCN reaction that required hospitalization: Unknown Has patient had a PCN reaction occurring within the last 10 years: No      Medication List    STOP taking these medications   isosorbide mononitrate 30 MG 24  hr tablet Commonly known as: IMDUR   metoprolol tartrate 25 MG tablet Commonly known as: LOPRESSOR   nitroGLYCERIN 0.4 MG SL tablet Commonly known as: NITROSTAT   polyethylene glycol 17 g packet Commonly known as: MIRALAX / GLYCOLAX   senna-docusate 8.6-50 MG tablet Commonly known as: Senokot-S     TAKE these medications   aspirin EC 81 MG tablet Take 1 tablet (81 mg total) by mouth daily. What changed: when to take this   cefdinir 300 MG capsule Commonly known as: OMNICEF Take 1 capsule (300 mg total) by mouth 2 (two) times daily for 12 days.   docusate sodium 100 MG capsule Commonly known as: COLACE Take 2 capsules (200 mg total) by mouth 2 (two) times daily.   furosemide 40 MG tablet Commonly known as: LASIX Take 1 tablet (40 mg total) by mouth daily.   metFORMIN 1000 MG tablet Commonly known as:  GLUCOPHAGE Take 1,000 mg by mouth 2 (two) times daily.   metroNIDAZOLE 500 MG tablet Commonly known as: FLAGYL Take 1 tablet (500 mg total) by mouth 3 (three) times daily for 12 days.   multivitamin with minerals Tabs tablet Take 1 tablet by mouth daily.   pantoprazole 40 MG tablet Commonly known as: PROTONIX Take 1 tablet (40 mg total) by mouth daily. Start taking on: Feb 12, 2021   QUEtiapine 25 MG tablet Commonly known as: SEROQUEL Take 1 tablet (25 mg total) by mouth 2 (two) times daily.   Resource ThickenUp Clear Powd Use with every meals. What changed:   how much to take  how to take this  when to take this   simvastatin 40 MG tablet Commonly known as: ZOCOR Take 1 tablet (40 mg total) by mouth at bedtime.   tamsulosin 0.4 MG Caps capsule Commonly known as: FLOMAX Take 1 capsule (0.4 mg total) by mouth daily.   thiamine 100 MG tablet Take 1 tablet (100 mg total) by mouth daily.   traMADol 50 MG tablet Commonly known as: Ultram Take 1 tablet (50 mg total) by mouth every 6 (six) hours as needed for up to 5 days.   vitamin B-12 1000 MCG tablet Commonly known as: CYANOCOBALAMIN Take 1 tablet (1,000 mcg total) by mouth daily.            Discharge Care Instructions  (From admission, onward)         Start     Ordered   02/11/21 0000  Discharge wound care:       Comments: State 3 coccyx ulcer Cleanse with NS, pat gently dry. Cover with size appropriate piece of silver hydrofiber (Aquacel Ag+ Advantage) Top with silicone foam dressing for the sacrum with the "tip" oriented away from the anus.  Change silver dressing daily. May reuse the silicone foam up to 3 days, change PRN soiling.   02/11/21 1339          Discharge Exam: Filed Weights   02/09/21 0418 02/10/21 0450 02/11/21 0326  Weight: 91.5 kg 91.6 kg 91.3 kg   Vitals:   02/11/21 0325 02/11/21 1247  BP: 135/75 125/77  Pulse: (!) 110 (!) 109  Resp: 20 20  Temp: 98.7 F (37.1 C) 98.7 F  (37.1 C)  SpO2: 91% 94%   General: Appear in mild distress, no Rash; Oral Mucosa Clear, moist. no Abnormal Neck Mass Or lumps, Conjunctiva normal  Cardiovascular: S1 and S2 Present, no Murmur, Respiratory: good respiratory effort, Bilateral Air entry present and CTA, no Crackles, no wheezes Abdomen:  Bowel Sound present, Soft and no tenderness Extremities: no Pedal edema Neurology: alert and oriented to time, place, and person affect appropriate. no new focal deficit Gait not checked due to patient safety concerns    The results of significant diagnostics from this hospitalization (including imaging, microbiology, ancillary and laboratory) are listed below for reference.    Significant Diagnostic Studies: CT Head Wo Contrast  Result Date: 01/12/2021 CLINICAL DATA:  Slurred speech, weakness, confusion for a month. EXAM: CT HEAD WITHOUT CONTRAST CT CERVICAL SPINE WITHOUT CONTRAST TECHNIQUE: Multidetector CT imaging of the head and cervical spine was performed following the standard protocol without intravenous contrast. Multiplanar CT image reconstructions of the cervical spine were also generated. COMPARISON:  None. FINDINGS: CT HEAD FINDINGS Brain: Patchy and confluent areas of decreased attenuation are noted throughout the deep and periventricular white matter of the cerebral hemispheres bilaterally, compatible with chronic microvascular ischemic disease. No evidence of large-territorial acute infarction. No parenchymal hemorrhage. No mass lesion. No extra-axial collection. No mass effect or midline shift. No hydrocephalus. Basilar cisterns are patent. Vascular: No hyperdense vessel. Atherosclerotic calcifications are present within the cavernous internal carotid arteries. Skull: No acute fracture or focal lesion. Sinuses/Orbits: Paranasal sinuses and mastoid air cells are clear. The orbits are unremarkable. Other: None. CT CERVICAL SPINE FINDINGS Alignment: Reversal of the normal cervical lordosis  centered at the C6 level. Findings due to severe degenerative changes at the level. Otherwise normal alignment. Skull base and vertebrae: Multilevel degenerative changes of the spine that are most prominent at the C1-C2 and C5 through C7 levels. No acute fracture. No aggressive appearing focal osseous lesion or focal pathologic process. Soft tissues and spinal canal: No prevertebral fluid or swelling. No visible canal hematoma. Upper chest: Unremarkable. Other: None. IMPRESSION: 1. No acute intracranial abnormality. 2. No acutely displaced fracture or traumatic listhesis of the cervical spine. Electronically Signed   By: Iven Finn M.D.   On: 01/12/2021 16:37   CT Cervical Spine Wo Contrast  Result Date: 01/12/2021 CLINICAL DATA:  Slurred speech, weakness, confusion for a month. EXAM: CT HEAD WITHOUT CONTRAST CT CERVICAL SPINE WITHOUT CONTRAST TECHNIQUE: Multidetector CT imaging of the head and cervical spine was performed following the standard protocol without intravenous contrast. Multiplanar CT image reconstructions of the cervical spine were also generated. COMPARISON:  None. FINDINGS: CT HEAD FINDINGS Brain: Patchy and confluent areas of decreased attenuation are noted throughout the deep and periventricular white matter of the cerebral hemispheres bilaterally, compatible with chronic microvascular ischemic disease. No evidence of large-territorial acute infarction. No parenchymal hemorrhage. No mass lesion. No extra-axial collection. No mass effect or midline shift. No hydrocephalus. Basilar cisterns are patent. Vascular: No hyperdense vessel. Atherosclerotic calcifications are present within the cavernous internal carotid arteries. Skull: No acute fracture or focal lesion. Sinuses/Orbits: Paranasal sinuses and mastoid air cells are clear. The orbits are unremarkable. Other: None. CT CERVICAL SPINE FINDINGS Alignment: Reversal of the normal cervical lordosis centered at the C6 level. Findings due to  severe degenerative changes at the level. Otherwise normal alignment. Skull base and vertebrae: Multilevel degenerative changes of the spine that are most prominent at the C1-C2 and C5 through C7 levels. No acute fracture. No aggressive appearing focal osseous lesion or focal pathologic process. Soft tissues and spinal canal: No prevertebral fluid or swelling. No visible canal hematoma. Upper chest: Unremarkable. Other: None. IMPRESSION: 1. No acute intracranial abnormality. 2. No acutely displaced fracture or traumatic listhesis of the cervical spine. Electronically Signed   By: Thomasena Edis  Mckinley Jewel M.D.   On: 01/12/2021 16:37   MR BRAIN WO CONTRAST  Result Date: 01/15/2021 CLINICAL DATA:  Headache, new or worsening.  Confusion. EXAM: MRI HEAD WITHOUT CONTRAST TECHNIQUE: Multiplanar, multiecho pulse sequences of the brain and surrounding structures were obtained without intravenous contrast. COMPARISON:  Head CT from 3 days ago. FINDINGS: Brain: Subcentimeter focus of weakly restricted diffusion in the parasagittal and subcortical right parietal lobe. Background of chronic small vessel ischemia with generalized ischemic gliosis in the cerebral white matter and pons. Small remote left cerebellar infarct. No masslike finding, hydrocephalus, atrophy, or collection. Vascular: Normal flow voids. Skull and upper cervical spine: Separate cervical MRI. No focal marrow lesion. Sinuses/Orbits: Negative Other: Truncated study due to patient confusion. IMPRESSION: 1. Small subacute infarct in the right parietal white matter, likely a current manifestation of the patient's extensive chronic small vessel ischemia. 2. No reversible finding. Electronically Signed   By: Monte Fantasia M.D.   On: 01/15/2021 12:01   MR CERVICAL SPINE WO CONTRAST  Result Date: 01/15/2021 CLINICAL DATA:  Confusion.  Frequent falls. EXAM: MRI CERVICAL SPINE WITHOUT CONTRAST TECHNIQUE: Multiplanar, multisequence MR imaging of the cervical spine was  performed. No intravenous contrast was administered. COMPARISON:  CT cervical spine dated January 12, 2021. FINDINGS: Despite efforts by the technologist and patient, motion artifact is present on today's exam and could not be eliminated. This reduces exam sensitivity and specificity. The examination was also ended early and the axial medic images were not obtained. Alignment: Unchanged reversal of the normal cervical lordosis and trace anterolisthesis at C5-C6. Vertebrae: No fracture, evidence of discitis, or bone lesion. Cord: Normal signal and morphology. Posterior Fossa, vertebral arteries, paraspinal tissues: Chronic microvascular ischemic changes in the pons. Otherwise negative. Disc levels: C2-C3:  Interbody and facet ankylosis.  No stenosis. C3-C4: No significant disc bulge or herniation. Mild-to-moderate bilateral facet arthropathy. Moderate right uncovertebral hypertrophy. Moderate right neuroforaminal stenosis. No spinal canal or left neuroforaminal stenosis. C4-C5: No significant disc bulge or herniation. Mild bilateral uncovertebral hypertrophy. Moderate left and mild right facet arthropathy. No stenosis. C5-C6: Severe disc height loss without significant disc bulge or herniation. Moderate bilateral facet uncovertebral hypertrophy. Mild bilateral neuroforaminal stenosis. No spinal canal stenosis. C6-C7: Mild disc bulging. Moderate bilateral uncovertebral hypertrophy. No stenosis. C7-T1: Negative disc. Moderate right and mild left facet arthropathy. No stenosis. The visualized upper thoracic spine is unremarkable. IMPRESSION: 1. Normal cervical spinal cord. 2. Multilevel degenerative changes of the cervical spine as described above. No spinal canal stenosis at any level. 3. Moderate right neuroforaminal stenosis at C3-C4. Electronically Signed   By: Titus Dubin M.D.   On: 01/15/2021 12:26   CT CHEST ABDOMEN PELVIS W CONTRAST  Result Date: 01/22/2021 CLINICAL DATA:  Chest pain or shortness of breath.  Sepsis. Effusion suspected. EXAM: CT CHEST, ABDOMEN, AND PELVIS WITH CONTRAST TECHNIQUE: Multidetector CT imaging of the chest, abdomen and pelvis was performed following the standard protocol during bolus administration of intravenous contrast. CONTRAST:  134mL OMNIPAQUE IOHEXOL 300 MG/ML  SOLN COMPARISON:  Plain film chest of 1 day prior. [place] 517 CTA chest. No prior abdominopelvic CTs. FINDINGS: CT CHEST FINDINGS Cardiovascular: Mild motion degradation throughout. Exam also degraded by patient arm position, not raised above the head. Right subclavian artery stent or calcification. Aortic atherosclerosis. Mild cardiomegaly, without pericardial effusion. Multivessel coronary artery atherosclerosis. No central pulmonary embolism, on this non-dedicated study. Mediastinum/Nodes: No mediastinal or hilar adenopathy. Lungs/Pleura: No pleural fluid.  Mild centrilobular emphysema. Musculoskeletal: No acute osseous abnormality. CT  ABDOMEN PELVIS FINDINGS Hepatobiliary: Normal liver. The gallbladder is mildly distended with suggestion of pericholecystic edema, including on 62/3 and 16/8. No calcified stone or biliary duct dilatation. Pancreas: Normal, without mass or ductal dilatation. Spleen: Normal in size, without focal abnormality. Adrenals/Urinary Tract: Mild left adrenal thickening. Normal right adrenal gland. Interpolar right renal 1.8 cm lesion measures on the order of 19 HU, favoring a minimally complex cyst. Inter/upper pole punctate right renal collecting system calculus. No hydronephrosis. Foley catheter within the urinary bladder. Stomach/Bowel: Normal stomach, without wall thickening. Moderate stool within the rectum, less so the remainder of the colon. Normal terminal ileum and appendix. Normal small bowel. Vascular/Lymphatic: Aortic atherosclerosis. No abdominopelvic adenopathy. Reproductive: Normal prostate. Other: No significant free fluid. No free intraperitoneal air. Right paramidline fat containing  ventral pelvic wall hernia. Presumably iatrogenic subcutaneous air within the anterior pelvic wall. Musculoskeletal: Right hip osteoarthritis. Degenerative disc disease at the lumbosacral junction, presuming a transitional S1 vertebral body. IMPRESSION: 1. Multifactorial degradation, including motion and patient arm position. 2. Mildly distended gallbladder with suggestion of pericholecystic edema. Correlate with right upper quadrant symptoms and consider ultrasound. 3. Right nephrolithiasis. 4. Possible constipation or even fecal impaction. 5. Aortic Atherosclerosis (ICD10-I70.0) and Emphysema (ICD10-J43.9). Coronary artery atherosclerosis. Electronically Signed   By: Abigail Miyamoto M.D.   On: 01/22/2021 15:03   MR ABDOMEN MRCP WO CONTRAST  Result Date: 02/06/2021 CLINICAL DATA:  Jaundice. Gallbladder wall thickening and irregular gallbladder sludge on prior ultrasound. EXAM: MRI ABDOMEN WITHOUT CONTRAST  (INCLUDING MRCP) TECHNIQUE: Multiplanar multisequence MR imaging of the abdomen was performed. Heavily T2-weighted images of the biliary and pancreatic ducts were obtained, and three-dimensional MRCP images were rendered by post processing. COMPARISON:  CT 01/22/2021. Ultrasound 01/22/2021 and 02/06/2021. Nuclear medicine hepatobiliary scan 01/24/2021. FINDINGS: Despite efforts by the technologist and patient, mild motion artifact is present on today's exam and could not be eliminated. This reduces exam sensitivity and specificity. Lower chest: Trace bilateral pleural effusions with mild atelectasis at both lung bases. Hepatobiliary: No focal hepatic abnormalities are identified. The thin section MRCP images are motion degraded, but there is only mild extrahepatic biliary dilatation with the common hepatic duct measuring 8 mm maximally. No evidence of choledocholithiasis or intrahepatic biliary dilatation. Mild gallbladder wall thickening and irregular sludge within the gallbladder lumen are similar to the recent  ultrasound. No discrete gallstones identified. Pancreas: Unremarkable. No pancreatic ductal dilatation or surrounding inflammatory changes. Spleen: Normal in size without focal abnormality. Adrenals/Urinary Tract: Both adrenal glands appear normal. There is a small cyst in the anterior interpolar region of the right kidney. The left kidney appears normal. No hydronephrosis. Stomach/Bowel: The stomach appears unremarkable for its degree of distension. No evidence of bowel wall thickening, distention or surrounding inflammatory change.Scattered colonic diverticulosis. Vascular/Lymphatic: There are no enlarged abdominal lymph nodes. Aortic atherosclerosis without aneurysm. Other: Supraumbilical hernia containing only fat appears stable. No ascites. Musculoskeletal: No acute or significant osseous findings. Mild edema within the erector spinae musculature. IMPRESSION: 1. Minimal extrahepatic biliary dilatation without evidence of choledocholithiasis, pancreatic lesion or pancreatic ductal dilatation. 2. Persistent abnormal appearance of the gallbladder with wall thickening and heterogeneous sludge. As correlated with previous imaging, cholecystitis not excluded. 3. Scattered colonic diverticulosis without acute inflammation. 4. Stable supraumbilical hernia containing only fat. Electronically Signed   By: Richardean Sale M.D.   On: 02/06/2021 14:54   MR 3D Recon At Scanner  Result Date: 02/06/2021 CLINICAL DATA:  Jaundice. Gallbladder wall thickening and irregular gallbladder sludge on prior ultrasound. EXAM:  MRI ABDOMEN WITHOUT CONTRAST  (INCLUDING MRCP) TECHNIQUE: Multiplanar multisequence MR imaging of the abdomen was performed. Heavily T2-weighted images of the biliary and pancreatic ducts were obtained, and three-dimensional MRCP images were rendered by post processing. COMPARISON:  CT 01/22/2021. Ultrasound 01/22/2021 and 02/06/2021. Nuclear medicine hepatobiliary scan 01/24/2021. FINDINGS: Despite efforts by  the technologist and patient, mild motion artifact is present on today's exam and could not be eliminated. This reduces exam sensitivity and specificity. Lower chest: Trace bilateral pleural effusions with mild atelectasis at both lung bases. Hepatobiliary: No focal hepatic abnormalities are identified. The thin section MRCP images are motion degraded, but there is only mild extrahepatic biliary dilatation with the common hepatic duct measuring 8 mm maximally. No evidence of choledocholithiasis or intrahepatic biliary dilatation. Mild gallbladder wall thickening and irregular sludge within the gallbladder lumen are similar to the recent ultrasound. No discrete gallstones identified. Pancreas: Unremarkable. No pancreatic ductal dilatation or surrounding inflammatory changes. Spleen: Normal in size without focal abnormality. Adrenals/Urinary Tract: Both adrenal glands appear normal. There is a small cyst in the anterior interpolar region of the right kidney. The left kidney appears normal. No hydronephrosis. Stomach/Bowel: The stomach appears unremarkable for its degree of distension. No evidence of bowel wall thickening, distention or surrounding inflammatory change.Scattered colonic diverticulosis. Vascular/Lymphatic: There are no enlarged abdominal lymph nodes. Aortic atherosclerosis without aneurysm. Other: Supraumbilical hernia containing only fat appears stable. No ascites. Musculoskeletal: No acute or significant osseous findings. Mild edema within the erector spinae musculature. IMPRESSION: 1. Minimal extrahepatic biliary dilatation without evidence of choledocholithiasis, pancreatic lesion or pancreatic ductal dilatation. 2. Persistent abnormal appearance of the gallbladder with wall thickening and heterogeneous sludge. As correlated with previous imaging, cholecystitis not excluded. 3. Scattered colonic diverticulosis without acute inflammation. 4. Stable supraumbilical hernia containing only fat.  Electronically Signed   By: Richardean Sale M.D.   On: 02/06/2021 14:54   NM Hepato W/EF  Result Date: 01/24/2021 CLINICAL DATA:  Biliary colic, gallbladder sludge and wall thickening on ultrasound EXAM: NUCLEAR MEDICINE HEPATOBILIARY IMAGING TECHNIQUE: Sequential images of the abdomen were obtained out to 60 minutes following intravenous administration of radiopharmaceutical. RADIOPHARMACEUTICALS:  5.25 mCi Tc-85m  Choletec IV COMPARISON:  Right upper quadrant ultrasound dated 01/22/2021. CT abdomen/pelvis dated 01/22/2021. FINDINGS: Prompt uptake and biliary excretion of activity by the liver is seen. Biliary activity passes into small bowel, consistent with patent common bile duct. Gallbladder not visualized at 60 minutes. Patient refused additional imaging. As such, patency of the cystic duct cannot be confirmed, and cholecystitis is not excluded. When correlating with prior studies, gallbladder was mildly distended with wall thickening on CT. No gallstones were evident on ultrasound. Overall clinical picture favors chronic cholecystitis over acute cholecystitis. IMPRESSION: Nonvisualization of the gallbladder, raising concern for cholecystitis, possibly chronic. These results will be called to the ordering clinician or representative by the Radiologist Assistant, and communication documented in the PACS or [employer]. Electronically Signed   By: Julian Hy M.D.   On: 01/24/2021 12:33   US Abdomen Limited  Result Date: 02/06/2021 CLINICAL DATA:  AST abnormality. EXAM: ULTRASOUND ABDOMEN LIMITED RIGHT UPPER QUADRANT COMPARISON:  January 22, 2021 FINDINGS: Gallbladder: A large amount of heterogeneous sludge is seen within the gallbladder lumen. No gallstones are identified. The gallbladder wall measures 5.2 mm in thickness. No sonographic Murphy sign noted by sonographer. Common bile duct: Diameter: 7.6 mm Liver: No focal lesion identified. Diffusely increased echogenicity of the liver parenchyma  is seen. Portal vein is patent on color Doppler  imaging with normal direction of blood flow towards the liver. Other: None. IMPRESSION: 1. Gallbladder sludge and gallbladder wall thickening without evidence of cholelithiasis. While this may represent sequelae associated with chronic cholecystitis, further evaluation with a nuclear medicine hepatobiliary scan is recommended. 2. Fatty liver. Electronically Signed   By: Virgina Norfolk M.D.   On: 02/06/2021 02:43   IR Perc Cholecystostomy  Result Date: 02/07/2021 INDICATION: Concern for acute cholecystitis. Poor operative candidate. Please perform image guided cholecystostomy tube placement for infection source control purposes. EXAM: ULTRASOUND AND FLUOROSCOPIC-GUIDED CHOLECYSTOSTOMY TUBE PLACEMENT COMPARISON:  MRCP-02/06/2021; right upper quadrant abdominal ultrasound-02/06/2021; nuclear medicine HIDA scan-01/24/2021; CT the chest, abdomen and pelvis-01/22/2021 MEDICATIONS: The patient is currently admitted to the hospital and on intravenous antibiotics. Antibiotics were administered within an appropriate time frame prior to skin puncture. ANESTHESIA/SEDATION: Moderate (conscious) sedation was employed during this procedure. Fentanyl 100 mcg was administered intravenously. Moderate Sedation Time: 14 minutes. The patient's level of consciousness and vital signs were monitored continuously by radiology nursing throughout the procedure under my direct supervision. CONTRAST:  50mL OMNIPAQUE IOHEXOL 300 MG/ML SOLN - administered into the gallbladder fossa. FLUOROSCOPY TIME:  1 minute, 36 seconds (92 mGy) COMPLICATIONS: None immediate. PROCEDURE: Informed written consent was obtained from the patient and the patient's family after a discussion of the risks, benefits and alternatives to treatment. Questions regarding the procedure were encouraged and answered. A timeout was performed prior to the initiation of the procedure. The right upper abdominal quadrant was  prepped and draped in the usual sterile fashion, and a sterile drape was applied covering the operative field. Maximum barrier sterile technique with sterile gowns and gloves were used for the procedure. A timeout was performed prior to the initiation of the procedure. Local anesthesia was provided with 1% lidocaine with epinephrine. Ultrasound scanning of the right upper quadrant demonstrates a mildly dilated gallbladder with minimal amount of pericholecystic fluid. Utilizing a transhepatic approach, a 22 gauge needle was advanced into the gallbladder under direct ultrasound guidance. An ultrasound image was saved for documentation purposes. Appropriate intraluminal puncture was confirmed with the efflux of bile and advancement of an 0.018 wire into the gallbladder lumen. The needle was exchanged for an Rhodes set. A small amount of contrast was injected to confirm appropriate intraluminal positioning. Over a Benson wire, a 68.2-French Cook cholecystomy tube was advanced into the gallbladder fossa, coiled and locked. Bile was aspirated and a small amount of contrast was injected as several post procedural spot radiographic images were obtained in various obliquities demonstrating passage of contrast through the cystic and common bile ducts to the level of the duodenum. The catheter was secured to the skin with suture, connected to a drainage bag and a dressing was placed. The patient tolerated the procedure well without immediate post procedural complication. IMPRESSION: 1. Successful ultrasound and fluoroscopic guided placement of a 10.2 French cholecystostomy tube. 2. Limited cholangiogram performed following cholecystostomy tube placement demonstrates patency of the cystic and common bile ducts with passage of contrast to the level of duodenum. Electronically Signed   By: Sandi Mariscal M.D.   On: 02/07/2021 16:17   DG Chest Port 1 View  Result Date: 02/06/2021 CLINICAL DATA:  Slurred speech. EXAM: PORTABLE  CHEST 1 VIEW COMPARISON:  January 21, 2021 FINDINGS: Decreased lung volumes are seen which is likely secondary to the degree of patient inspiration. Very mild left basilar atelectasis is noted. There is no evidence of a pleural effusion or pneumothorax. The heart size and mediastinal contours are  within normal limits. Mild calcification of the aortic arch is noted. The visualized skeletal structures are unremarkable. IMPRESSION: Very mild left basilar atelectasis. Electronically Signed   By: Virgina Norfolk M.D.   On: 02/06/2021 01:14   DG Chest Port 1 View  Result Date: 01/21/2021 CLINICAL DATA:  Shortness of breath. EXAM: PORTABLE CHEST 1 VIEW COMPARISON:  January 20, 2021 FINDINGS: The heart size and mediastinal contours are within normal limits. Mild linear atelectasis is identified in the left lung base. There is no focal pneumonia, pulmonary edema or pleural effusion. The visualized skeletal structures are stable. IMPRESSION: Mild linear atelectasis in the left lung base.  No focal pneumonia. Electronically Signed   By: Abelardo Diesel M.D.   On: 01/21/2021 08:44   DG Chest Port 1 View  Result Date: 01/20/2021 CLINICAL DATA:  Dyspnea, chest pain EXAM: PORTABLE CHEST 1 VIEW COMPARISON:  01/17/2021 FINDINGS: Lungs volumes are small, but are symmetric and are clear. No pneumothorax or pleural effusion. Cardiac size within normal limits. Pulmonary vascularity is normal. Osseous structures are age-appropriate. No acute bone abnormality. IMPRESSION: No active disease. Electronically Signed   By: Fidela Salisbury MD   On: 01/20/2021 08:09   DG Chest Port 1 View  Result Date: 01/17/2021 CLINICAL DATA:  Short of breath and nausea EXAM: PORTABLE CHEST 1 VIEW COMPARISON:  01/17/2021 FINDINGS: Single frontal view of the chest demonstrates an unremarkable cardiac silhouette. Lung volumes are diminished with crowding of the central vasculature. There is developing consolidation within the medial right lung base which  could reflect airspace disease or atelectasis. No effusion or pneumothorax. IMPRESSION: 1. Low lung volumes, with developing consolidation medial right lung base favor atelectasis. 2. Continued central vascular congestion. Electronically Signed   By: Randa Ngo M.D.   On: 01/17/2021 17:50   DG Chest Port 1 View  Result Date: 01/17/2021 CLINICAL DATA:  Shortness of breath. EXAM: PORTABLE CHEST 1 VIEW COMPARISON:  05/12/2016 FINDINGS: Single view of the chest demonstrates low lung volumes. Heart size is normal but there is fullness of the central vascular structures. No discrete airspace disease or lung consolidation. Negative for a pneumothorax. IMPRESSION: 1. Low lung volumes without focal lung disease. 2. Slight prominence of the central vascular structures including the azygos shadow. These findings may be accentuated by the low lung volumes. Electronically Signed   By: Markus Daft M.D.   On: 01/17/2021 08:15   DG Knee Complete 4 Views Right  Result Date: 01/12/2021 CLINICAL DATA:  Atraumatic right knee pain. EXAM: RIGHT KNEE - COMPLETE 4+ VIEW COMPARISON:  January 07, 2018 FINDINGS: A right knee replacement is seen without evidence of surrounding lucency to suggest the presence of hardware loosening or infection. No evidence of an acute fracture or dislocation. A small joint effusion is seen. IMPRESSION: 1. Right knee replacement without evidence of hardware loosening or infection. 2. Small joint effusion. Electronically Signed   By: Virgina Norfolk M.D.   On: 01/12/2021 19:39   DG Abd Portable 1V  Result Date: 01/17/2021 CLINICAL DATA:  Short of breath, nausea EXAM: PORTABLE ABDOMEN - 1 VIEW COMPARISON:  01/06/2014 FINDINGS: Supine frontal view of the abdomen and pelvis was performed, excluding the left flank and upper abdomen by collimation. Bowel gas pattern is unremarkable without obstruction or ileus. No masses or abnormal calcifications. Moderate right hip osteoarthritis with joint space  narrowing, bony remodeling of the femoral head, and marked marginal osteophyte formation. This has progressed significantly since prior study. Stable mild left hip osteoarthritis.  IMPRESSION: 1. Unremarkable bowel gas pattern. 2. Moderate to severe progressive right hip osteoarthritis. Electronically Signed   By: Randa Ngo M.D.   On: 01/17/2021 17:49   EEG adult  Result Date: 01/23/2021 Lora Havens, MD     01/23/2021 10:18 AM Patient Name: Vincent [name] MRN: [id number] Epilepsy Attending: Lora Havens Referring Physician/Provider: Dr Lala Lund Date: 01/23/2021 Duration: 22.30 mins Patient history: 70 year old male with altered mental status.  EEG to evaluate for seizures. Level of alertness: lethargic, asleep AEDs during EEG study: None Technical aspects: This EEG study was done with scalp electrodes positioned according to the 10-20 International system of electrode placement. Electrical activity was acquired at a sampling rate of 500Hz  and reviewed with a high frequency filter of 70Hz  and a low frequency filter of 1Hz . EEG data were recorded continuously and digitally stored. Description: No clear posterior dominant rhythm was seen. Sleep was characterized by vertex waves, sleep spindles (12 to 14 Hz), maximal frontocentral region. EEG showed continuous generalized 5 to 7 Hz theta as well as intermittent 2-3Hz  delta slowing. Physiologic photic driving was not seen during photic stimulation. Hyperventilation was not performed.   ABNORMALITY - Continuous slow, generalized IMPRESSION: This study is suggestive of moderate diffuse encephalopathy, nonspecific etiology. No seizures or epileptiform discharges were seen throughout the recording. Lora Havens   ECHOCARDIOGRAM COMPLETE  Result Date: 01/13/2021    ECHOCARDIOGRAM REPORT   Patient Name:   Vincent [name] Date of Exam: 01/13/2021 Medical Rec #:  [id number]      Height:       68.0 in Accession #:    [id number]     Weight:       225.0 lb  Date of Birth:  Feb 24, 1951     BSA:          2.149 m Patient Age:    60 years       BP:           126/72 mmHg Patient Gender: M              HR:           96 bpm. Exam Location:  Inpatient Procedure: 2D Echo, Intracardiac Opacification Agent, Cardiac Doppler and Color            Doppler Indications:    TIA  History:        Patient has no prior history of Echocardiogram examinations. CAD                 and Acute MI; Risk Factors:Hypertension, Dyslipidemia and                 Diabetes.  Sonographer:    Luisa Hart RDCS Referring Phys: [id number] Lequita Halt  Sonographer Comments: Suboptimal apical window. IMPRESSIONS  1. Left ventricular ejection fraction, by estimation, is 45 to 50%. The left ventricle has mildly decreased function. The left ventricle demonstrates regional wall motion abnormalities (see scoring diagram/findings for description). Apical akinesis. Left ventricular diastolic parameters are consistent with Grade I diastolic dysfunction (impaired relaxation).  2. Swirling of contrast at apex consistent with low flow but no thrombus seen  3. Right ventricular systolic function is normal. The right ventricular size is normal. Tricuspid regurgitation signal is inadequate for assessing PA pressure.  4. The mitral valve is normal in structure. No evidence of mitral valve regurgitation.  5. The aortic valve is tricuspid. Aortic valve regurgitation is trivial. Mild to moderate aortic valve sclerosis/calcification is  present, without any evidence of aortic stenosis.  6. The inferior vena cava is normal in size with greater than 50% respiratory variability, suggesting right atrial pressure of 3 mmHg. FINDINGS  Left Ventricle: Left ventricular ejection fraction, by estimation, is 45 to 50%. The left ventricle has mildly decreased function. The left ventricle demonstrates regional wall motion abnormalities. Definity contrast agent was given IV to delineate the left ventricular endocardial borders. The left  ventricular internal cavity size was normal in size. There is no left ventricular hypertrophy. Left ventricular diastolic parameters are consistent with Grade I diastolic dysfunction (impaired relaxation).  LV Wall Scoring: The entire apex is akinetic. The anterior wall, antero-lateral wall, anterior septum, inferior wall, posterior wall, mid inferoseptal segment, and basal inferoseptal segment are normal. Right Ventricle: The right ventricular size is normal. No increase in right ventricular wall thickness. Right ventricular systolic function is normal. Tricuspid regurgitation signal is inadequate for assessing PA pressure. Left Atrium: Left atrial size was normal in size. Right Atrium: Right atrial size was normal in size. Pericardium: There is no evidence of pericardial effusion. Mitral Valve: The mitral valve is normal in structure. No evidence of mitral valve regurgitation. Tricuspid Valve: The tricuspid valve is normal in structure. Tricuspid valve regurgitation is trivial. Aortic Valve: The aortic valve is tricuspid. Aortic valve regurgitation is trivial. Aortic regurgitation PHT measures 446 msec. Mild to moderate aortic valve sclerosis/calcification is present, without any evidence of aortic stenosis. Aortic valve mean gradient measures 4.0 mmHg. Aortic valve peak gradient measures 7.4 mmHg. Aortic valve area, by VTI measures 4.43 cm. Pulmonic Valve: The pulmonic valve was not well visualized. Pulmonic valve regurgitation is not visualized. Aorta: The aortic root and ascending aorta are structurally normal, with no evidence of dilitation. Venous: The inferior vena cava is normal in size with greater than 50% respiratory variability, suggesting right atrial pressure of 3 mmHg. IAS/Shunts: The interatrial septum was not well visualized.  LEFT VENTRICLE PLAX 2D LVIDd:         5.10 cm  Diastology LVIDs:         4.00 cm  LV e' medial:    4.79 cm/s LV PW:         0.70 cm  LV E/e' medial:  14.5 LV IVS:         0.70 cm  LV e' lateral:   9.14 cm/s LVOT diam:     2.70 cm  LV E/e' lateral: 7.6 LV SV:         110 LV SV Index:   51 LVOT Area:     5.73 cm  RIGHT VENTRICLE RV S prime:     9.79 cm/s  PULMONARY VEINS TAPSE (M-mode): 2.1 cm     A Reversal Duration: 100.00 msec                            A Reversal Velocity: 29.70 cm/s                            Diastolic Velocity:  0000000 cm/s                            S/D Velocity:        1.40  Systolic Velocity:   [id number] cm/s LEFT ATRIUM             Index LA diam:        2.80 cm 1.30 cm/m LA Vol (A2C):   36.8 ml 17.13 ml/m LA Vol (A4C):   17.1 ml 7.96 ml/m LA Biplane Vol: 26.0 ml 12.10 ml/m  AORTIC VALVE                   PULMONIC VALVE AV Area (Vmax):    4.16 cm    PV Vmax:       0.76 m/s AV Area (Vmean):   4.51 cm    PV Vmean:      55.300 cm/s AV Area (VTI):     4.43 cm    PV VTI:        0.140 m AV Vmax:           136.00 cm/s PV Peak grad:  2.3 mmHg AV Vmean:          86.400 cm/s PV Mean grad:  1.0 mmHg AV VTI:            0.248 m AV Peak Grad:      7.4 mmHg AV Mean Grad:      4.0 mmHg LVOT Vmax:         98.80 cm/s LVOT Vmean:        68.000 cm/s LVOT VTI:          0.192 m LVOT/AV VTI ratio: 0.77 AI PHT:            446 msec  AORTA Ao Root diam: 3.50 cm Ao Asc diam:  3.50 cm MITRAL VALVE MV Area (PHT): 5.13 cm     SHUNTS MV Decel Time: 148 msec     Systemic VTI:  0.19 m MR Peak grad: 61.2 mmHg     Systemic Diam: 2.70 cm MR Vmax:      391.00 cm/s MV E velocity: 69.40 cm/s MV A velocity: 129.00 cm/s MV E/A ratio:  0.54 Oswaldo Milian MD Electronically signed by Oswaldo Milian MD Signature Date/Time: 01/13/2021/11:23:19 AM    Final    VAS [place] LOWER EXTREMITY VENOUS (DVT)  Result Date: 01/23/2021  Lower Venous DVT Study Indications: Fever.  Limitations: Altered mental status, patient in restraints. Comparison Study: No prior study Performing Technologist: Sharion Dove RVS  Examination Guidelines: A complete evaluation includes B-mode  imaging, spectral Doppler, color Doppler, and power Doppler as needed of all accessible portions of each vessel. Bilateral testing is considered an integral part of a complete examination. Limited examinations for reoccurring indications may be performed as noted. The reflux portion of the exam is performed with the patient in reverse Trendelenburg.  +---------+---------------+---------+-----------+----------+--------------+ RIGHT    CompressibilityPhasicitySpontaneityPropertiesThrombus Aging +---------+---------------+---------+-----------+----------+--------------+ CFV      Full           Yes      Yes                                 +---------+---------------+---------+-----------+----------+--------------+ SFJ      Full                                                        +---------+---------------+---------+-----------+----------+--------------+ FV Prox  Full                                                        +---------+---------------+---------+-----------+----------+--------------+  FV Mid   Full                                                        +---------+---------------+---------+-----------+----------+--------------+ FV DistalFull                                                        +---------+---------------+---------+-----------+----------+--------------+ PFV      Full                                                        +---------+---------------+---------+-----------+----------+--------------+ POP      Full           Yes      Yes                                 +---------+---------------+---------+-----------+----------+--------------+ PTV      Full                                                        +---------+---------------+---------+-----------+----------+--------------+ PERO     Full                                                        +---------+---------------+---------+-----------+----------+--------------+    +---------+---------------+---------+-----------+----------+--------------+ LEFT     CompressibilityPhasicitySpontaneityPropertiesThrombus Aging +---------+---------------+---------+-----------+----------+--------------+ CFV      Full           Yes      Yes                                 +---------+---------------+---------+-----------+----------+--------------+ SFJ      Full                                                        +---------+---------------+---------+-----------+----------+--------------+ FV Prox  Full                                                        +---------+---------------+---------+-----------+----------+--------------+ FV Mid   Full                                                        +---------+---------------+---------+-----------+----------+--------------+   FV DistalFull                                                        +---------+---------------+---------+-----------+----------+--------------+ PFV      Full                                                        +---------+---------------+---------+-----------+----------+--------------+ POP      Full           Yes      Yes                                 +---------+---------------+---------+-----------+----------+--------------+ PTV      Full                                                        +---------+---------------+---------+-----------+----------+--------------+ PERO     Full                                                        +---------+---------------+---------+-----------+----------+--------------+     Summary: RIGHT: - There is no evidence of deep vein thrombosis in the lower extremity. However, portions of this examination were limited- see technologist comments above.  LEFT: - There is no evidence of deep vein thrombosis in the lower extremity. However, portions of this examination were limited- see technologist comments above.  *See  table(s) above for measurements and observations. Electronically signed by Ruta Hinds MD on 01/23/2021 at 12:52:59 PM.    Final    US Abdomen Limited RUQ (LIVER/GB)  Result Date: 01/22/2021 CLINICAL DATA:  Abdominal pain and suspicious findings on recent CT examination. EXAM: ULTRASOUND ABDOMEN LIMITED RIGHT UPPER QUADRANT COMPARISON:  CT from earlier in the same day. FINDINGS: Gallbladder: Gallbladder is well distended with sludge within. No pericholecystic fluid is noted. Mild wall thickening is noted to 5 mm. No cholelithiasis is seen. Common bile duct: Diameter: 6.2 mm. Liver: Mild fatty infiltration of the liver is noted. Portal vein is patent on color Doppler imaging with normal direction of blood flow towards the liver. Other: None. IMPRESSION: Gallbladder sludge without evidence of cholelithiasis. Some wall thickening is noted. These changes could represent some chronic cholecystitis. HIDA scan may be helpful in this regard. Electronically Signed   By: Inez Catalina M.D.   On: 01/22/2021 20:40    Microbiology: Recent Results (from the past 240 hour(s))  Blood Culture (routine x 2)     Status: None   Collection Time: 02/06/21 12:21 AM   Specimen: BLOOD  Result Value Ref Range Status   Specimen Description   Final    BLOOD BLOOD RIGHT HAND Performed at [facility] [address], [place], [place] [address]    Special Requests   Final  BOTTLES DRAWN AEROBIC AND ANAEROBIC Blood Culture adequate volume Performed at [facility] [address], [place], [place] [address]    Culture   Final    NO GROWTH 5 DAYS Performed at [facility], [place] [address] [place], [place] [address]    Report Status 02/11/2021 FINAL  Final  Urine culture     Status: Abnormal   Collection Time: 02/06/21  1:50 AM   Specimen: In/Out Cath Urine  Result Value Ref Range Status   Specimen Description   Final    IN/OUT CATH URINE Performed at [facility] [address] [place], [place] [address]    Special Requests   Final    NONE Performed at [facility], [place] [address], [place], [place] [address]    Culture 50,000 COLONIES/mL ESCHERICHIA COLI (A)  Final   Report Status 02/08/2021 FINAL  Final   Organism ID, Bacteria ESCHERICHIA COLI (A)  Final      Susceptibility   Escherichia coli - MIC*    AMPICILLIN >=32 RESISTANT Resistant     CEFAZOLIN <=4 SENSITIVE Sensitive     CEFEPIME <=0.12 SENSITIVE Sensitive     CEFTRIAXONE <=0.25 SENSITIVE Sensitive     CIPROFLOXACIN 1 SENSITIVE Sensitive     GENTAMICIN >=16 RESISTANT Resistant     IMIPENEM <=0.25 SENSITIVE Sensitive     NITROFURANTOIN <=16 SENSITIVE Sensitive     TRIMETH/SULFA >=320 RESISTANT Resistant     AMPICILLIN/SULBACTAM >=32 RESISTANT Resistant     PIP/TAZO <=4 SENSITIVE Sensitive     * 50,000 COLONIES/mL ESCHERICHIA COLI  Resp Panel by RT-PCR (Flu A&B, Covid) Nasopharyngeal Swab     Status: None   Collection Time: 02/06/21  1:50 AM   Specimen: Nasopharyngeal Swab; Nasopharyngeal(NP) swabs in vial transport medium  Result Value Ref Range Status   SARS Coronavirus 2 by RT PCR NEGATIVE NEGATIVE Final    Comment: (NOTE) SARS-CoV-2 target nucleic acids are NOT DETECTED.  The SARS-CoV-2 RNA is generally detectable in upper respiratory specimens during the acute phase of infection. The lowest concentration of SARS-CoV-2 viral copies this assay can detect is 138 copies/mL. A negative result does not preclude SARS-Cov-2 infection and should not be used as the sole basis for treatment or other patient management decisions. A negative result may occur with  improper specimen collection/handling, submission of specimen other than nasopharyngeal swab, presence of viral mutation(s) within the areas targeted by this assay, and inadequate number of viral copies(<138 copies/mL). A negative result must be combined with clinical observations,  patient history, and epidemiological information. The expected result is Negative.  Fact Sheet for Patients:  EntrepreneurPulse.com.au  Fact Sheet for Healthcare Providers:  IncredibleEmployment.be  This test is no t yet approved or cleared by the [place] FDA and  has been authorized for detection and/or diagnosis of SARS-CoV-2 by FDA under an Emergency Use Authorization (EUA). This EUA will remain  in effect (meaning this test can be used) for the duration of the COVID-19 declaration under Section 564(b)(1) of the Act, 21 U.S.C.section 360bbb-3(b)(1), unless the authorization is terminated  or revoked sooner.       Influenza A by PCR NEGATIVE NEGATIVE Final   Influenza B by PCR NEGATIVE NEGATIVE Final    Comment: (NOTE) The Xpert Xpress SARS-CoV-2/FLU/RSV plus assay is intended as an aid in the diagnosis of influenza from Nasopharyngeal swab specimens and should not be used as a sole basis for treatment. Nasal washings and aspirates are  unacceptable for Xpert Xpress SARS-CoV-2/FLU/RSV testing.  Fact Sheet for Patients: EntrepreneurPulse.com.au  Fact Sheet for Healthcare Providers: IncredibleEmployment.be  This test is not yet approved or cleared by the [place] FDA and has been authorized for detection and/or diagnosis of SARS-CoV-2 by FDA under an Emergency Use Authorization (EUA). This EUA will remain in effect (meaning this test can be used) for the duration of the COVID-19 declaration under Section 564(b)(1) of the Act, 21 U.S.C. section 360bbb-3(b)(1), unless the authorization is terminated or revoked.  Performed at [facility], [place] [address], [place], [place] [address]   Blood Culture (routine x 2)     Status: None   Collection Time: 02/06/21  3:40 AM   Specimen: BLOOD  Result Value Ref Range Status   Specimen Description   Final    BLOOD LEFT  ANTECUBITAL Performed at [facility] [address] [place], [place] [address]    Special Requests   Final    BOTTLES DRAWN AEROBIC AND ANAEROBIC Blood Culture adequate volume Performed at [facility] [address] [place], [place] [address]    Culture   Final    NO GROWTH 5 DAYS Performed at [facility], [place] [address], [place], [place] [address]    Report Status 02/11/2021 FINAL  Final     Labs: CBC: Recent Labs  Lab 02/05/21 0016 02/06/21 0510 02/07/21 0350 02/08/21 0326 02/09/21 0353 02/10/21 0324  WBC 25.1* 19.4* 10.3 11.0* 11.3* 13.4*  NEUTROABS 21.4*  --  7.5 8.8* 8.5* 10.1*  HGB 12.0* 10.3* 10.5* 11.0* 11.2* 11.3*  HCT 35.4* 31.0* 33.0* 33.4* 34.6* 35.8*  MCV 89.2 90.4 93.2 91.0 91.8 92.5  PLT 280 229 253 284 291 [id number]   Basic Metabolic Panel: Recent Labs  Lab 02/06/21 0510 02/07/21 0350 02/08/21 0326 02/09/21 0353 02/10/21 0324  NA 136 136 136 133* 134*  K 3.5 3.4* 3.3* 3.8 3.6  CL 103 105 106 103 103  CO2 22 22 21* 19* 20*  GLUCOSE 214* 111* 129* 114* 132*  BUN 14 10 7* <5* 8  CREATININE 0.63 0.56* 0.42* 0.52* 0.46*  CALCIUM 8.7* 8.7* 8.8* 8.8* 8.8*   Liver Function Tests: Recent Labs  Lab 02/06/21 0510 02/07/21 0350 02/08/21 0326 02/09/21 0353 02/10/21 0324  AST 172* 110* 46* 22 18  ALT 132* 104* 76* 50* 36  ALKPHOS 341* 294* 256* 227* 185*  BILITOT 6.1* 3.0* 2.0* 1.7* 1.2  PROT 5.5* 5.6* 5.6* 5.9* 6.0*  ALBUMIN 2.3* 2.4* 2.4* 2.4* 2.5*   CBG: Recent Labs  Lab 02/10/21 1124 02/10/21 1635 02/10/21 2048 02/11/21 0726 02/11/21 1218  GLUCAP 185* 131* 196* 149* 206*    Time spent: 35 minutes  Signed:  Berle Mull  [facility]  02/11/2021 1:40 PM

## 2021-02-13 DIAGNOSIS — K811 Chronic cholecystitis: Secondary | ICD-10-CM | POA: Diagnosis not present

## 2021-02-13 DIAGNOSIS — R52 Pain, unspecified: Secondary | ICD-10-CM | POA: Diagnosis not present

## 2021-02-13 DIAGNOSIS — L89153 Pressure ulcer of sacral region, stage 3: Secondary | ICD-10-CM | POA: Diagnosis not present

## 2021-02-13 DIAGNOSIS — R410 Disorientation, unspecified: Secondary | ICD-10-CM | POA: Diagnosis not present

## 2021-02-14 DIAGNOSIS — G894 Chronic pain syndrome: Secondary | ICD-10-CM | POA: Diagnosis not present

## 2021-02-14 DIAGNOSIS — M25562 Pain in left knee: Secondary | ICD-10-CM | POA: Diagnosis not present

## 2021-02-14 DIAGNOSIS — L89153 Pressure ulcer of sacral region, stage 3: Secondary | ICD-10-CM | POA: Diagnosis not present

## 2021-02-14 DIAGNOSIS — F411 Generalized anxiety disorder: Secondary | ICD-10-CM | POA: Diagnosis not present

## 2021-02-15 DIAGNOSIS — N3946 Mixed incontinence: Secondary | ICD-10-CM | POA: Diagnosis not present

## 2021-02-15 DIAGNOSIS — M6281 Muscle weakness (generalized): Secondary | ICD-10-CM | POA: Diagnosis not present

## 2021-02-15 DIAGNOSIS — L24A2 Irritant contact dermatitis due to fecal, urinary or dual incontinence: Secondary | ICD-10-CM | POA: Diagnosis not present

## 2021-02-15 DIAGNOSIS — L22 Diaper dermatitis: Secondary | ICD-10-CM | POA: Diagnosis not present

## 2021-02-16 DIAGNOSIS — I25118 Atherosclerotic heart disease of native coronary artery with other forms of angina pectoris: Secondary | ICD-10-CM | POA: Diagnosis not present

## 2021-02-16 DIAGNOSIS — E1169 Type 2 diabetes mellitus with other specified complication: Secondary | ICD-10-CM | POA: Diagnosis not present

## 2021-02-16 DIAGNOSIS — I10 Essential (primary) hypertension: Secondary | ICD-10-CM | POA: Diagnosis not present

## 2021-02-16 DIAGNOSIS — K811 Chronic cholecystitis: Secondary | ICD-10-CM | POA: Diagnosis not present

## 2021-02-17 ENCOUNTER — Other Ambulatory Visit: Payer: Self-pay | Admitting: Surgery

## 2021-02-17 DIAGNOSIS — K819 Cholecystitis, unspecified: Secondary | ICD-10-CM

## 2021-02-20 DIAGNOSIS — F411 Generalized anxiety disorder: Secondary | ICD-10-CM | POA: Diagnosis not present

## 2021-02-20 DIAGNOSIS — M159 Polyosteoarthritis, unspecified: Secondary | ICD-10-CM | POA: Diagnosis not present

## 2021-02-20 DIAGNOSIS — L89153 Pressure ulcer of sacral region, stage 3: Secondary | ICD-10-CM | POA: Diagnosis not present

## 2021-02-20 DIAGNOSIS — M25562 Pain in left knee: Secondary | ICD-10-CM | POA: Diagnosis not present

## 2021-02-21 ENCOUNTER — Other Ambulatory Visit: Payer: Self-pay

## 2021-02-21 ENCOUNTER — Non-Acute Institutional Stay: Payer: HMO | Admitting: Hospice

## 2021-02-21 DIAGNOSIS — R6521 Severe sepsis with septic shock: Secondary | ICD-10-CM | POA: Diagnosis not present

## 2021-02-21 DIAGNOSIS — I5042 Chronic combined systolic (congestive) and diastolic (congestive) heart failure: Secondary | ICD-10-CM

## 2021-02-21 DIAGNOSIS — G4700 Insomnia, unspecified: Secondary | ICD-10-CM | POA: Diagnosis not present

## 2021-02-21 DIAGNOSIS — Z515 Encounter for palliative care: Secondary | ICD-10-CM | POA: Diagnosis not present

## 2021-02-21 DIAGNOSIS — A419 Sepsis, unspecified organism: Secondary | ICD-10-CM | POA: Diagnosis not present

## 2021-02-21 NOTE — Progress Notes (Signed)
[facility] Consult Note Telephone: [phone]  Fax: [phone]  PATIENT NAME: Vincent [name] [place] [address] [id number] (home)  DOB: Jan 20, 1951 MRN: [id number]  PRIMARY CARE PROVIDER:    Ginger [name]., MD,  [place] [address] [phone]  REFERRING PROVIDER:   Martinique Miller NP RESPONSIBLE PARTY:   Self/spouse Contact Information    Name Relation Home Work Mobile   Vincent [name] Spouse [phone]  [phone]       I met face to face with patient and family at facility. Palliative Care was asked to follow this patient by consultation request of  Vincent Miller NP to address advance care planning, complex medical decision making and goals of care clarification. Spouse is at bedside with patient during visit. This is the initial visit.    ASSESSMENT AND / RECOMMENDATIONS:   Advance Care Planning: Our advance care planning conversation included a discussion about:     The value and importance of advance care planning   Difference between Hospice and Palliative care  Exploration of goals of care in the event of a sudden injury or illness   Identification and preparation of a healthcare agent   Review and updating or creation of an  advance directive document .  CODE STATUS: CODE STATUS discussed.  Patient affirmed he is a DO NOT RESUSCITATE.  Signed DNR form in facility records; same document uploaded to epic today.  Goals of Care: Goals include to maximize quality of life and symptom management.  Patient hopes to be discharged home.  I spent 16  minutes providing this initial consultation. More than 50% of the time in this consultation was spent on counseling patient and coordinating communication. --------------------------------------------------------------------------------------------------------------------------------------  Symptom Management/Plan: Severe  sepsis: Likely related to acute on chronic cholecystitis,  UTI.  Continue Flagyl and cefdinir as ordered to completion.  Routine CBC CMP CHF: Continue furosemide.  No added salt to heart healthy diet. Fluid restriction as ordered.  Insomnia: Continue melatonin and trazodone.  Sleep hygiene encouraged. Weakness: Continue ongoing PT OT for strengthening. Follow up: Palliative care will continue to follow for complex medical decision making, advance care planning, and clarification of goals. Return 6 weeks or prn.Encouraged to call provider sooner with any concerns.   Family /Caregiver/Community Supports: Patient in SNF for acute rehab.  Plan is to be discharged home.  PPS: 50%  HOSPICE ELIGIBILITY/DIAGNOSIS: TBD  Chief Complaint: Initial Palliative care visit/severe sepsis   HISTORY OF PRESENT ILLNESS:  Vincent [name] is a 70 y.o. year old male  with multiple medical conditions including severe sepsis likely related to acute on chronic cholecystitis as well as UTI with associated altered mental status and deconditioning, impairing quality of life.  Ongoing antibiotics is helpful as well as ongoing physical and Occupational Therapy.  Altered mental status resolved at this time. Hospitalization 5/1 - 02/11/2021 for severe sepsis with septic shock.  History of coronary atherosclerosis, type 2 diabetes mellitus, combined systolic and diastolic congestive heart failure, chronic choleccystitis with IR drain. History obtained from review of EMR, discussion with primary team, caregiver, family and/or Vincent [name].  Review and summarization of Epic records shows history from other than patient. Rest of 10 point ROS asked and negative.     Review of lab tests/diagnostics   Results for Vincent, [name] (MRN [id number]) as of 02/21/2021 10:32  Ref. Range 02/10/2021 03:24  COMPREHENSIVE METABOLIC PANEL Unknown Rpt (A)  Sodium Latest Ref Range: 135 -  145 mmol/L 134 (L)  Potassium Latest Ref Range: 3.5 - 5.1 mmol/L  3.6  Chloride Latest Ref Range: 98 - 111 mmol/L 103  CO2 Latest Ref Range: 22 - 32 mmol/L 20 (L)  Glucose Latest Ref Range: 70 - 99 mg/dL 132 (H)  BUN Latest Ref Range: 8 - 23 mg/dL 8  Creatinine Latest Ref Range: 0.61 - 1.24 mg/dL 0.46 (L)  Calcium Latest Ref Range: 8.9 - 10.3 mg/dL 8.8 (L)  Anion gap Latest Ref Range: 5 - 15  11  Alkaline Phosphatase Latest Ref Range: 38 - 126 U/L 185 (H)  Albumin Latest Ref Range: 3.5 - 5.0 g/dL 2.5 (L)  AST Latest Ref Range: 15 - 41 U/L 18  ALT Latest Ref Range: 0 - 44 U/L 36  Total Protein Latest Ref Range: 6.5 - 8.1 g/dL 6.0 (L)  Total Bilirubin Latest Ref Range: 0.3 - 1.2 mg/dL 1.2  GFR, Estimated Latest Ref Range: >60 mL/min >60  Results for Vincent, [name] (MRN [id number]) as of 02/21/2021 10:32  Ref. Range 02/10/2021 03:24  WBC Latest Ref Range: 4.0 - 10.5 K/uL 13.4 (H)  RBC Latest Ref Range: 4.22 - 5.81 MIL/uL 3.87 (L)  Hemoglobin Latest Ref Range: 13.0 - 17.0 g/dL 11.3 (L)  HCT Latest Ref Range: 39.0 - 52.0 % 35.8 (L)  MCV Latest Ref Range: 80.0 - 100.0 fL 92.5  MCH Latest Ref Range: 26.0 - 34.0 pg 29.2  MCHC Latest Ref Range: 30.0 - 36.0 g/dL 31.6  RDW Latest Ref Range: 11.5 - 15.5 % 14.3  Platelets Latest Ref Range: 150 - 400 K/uL 295  nRBC Latest Ref Range: 0.0 - 0.2 % 0.0  Neutrophils Latest Units: % 76  Lymphocytes Latest Units: % 13  Monocytes Relative Latest Units: % 9  Eosinophil Latest Units: % 0  Basophil Latest Units: % 0  Immature Granulocytes Latest Units: % 2  NEUT# Latest Ref Range: 1.7 - 7.7 K/uL 10.1 (H)  Lymphocyte # Latest Ref Range: 0.7 - 4.0 K/uL 1.8  Monocyte # Latest Ref Range: 0.1 - 1.0 K/uL 1.2 (H)  Eosinophils Absolute Latest Ref Range: 0.0 - 0.5 K/uL 0.1   ROS General: NAD EYES: denies vision changes ENMT: denies dysphagia Cardiovascular: denies chest pain/discomfort Pulmonary: denies cough, denies SOB Abdomen: endorses good appetite, denies constipation, endorses continence of bowel, biliary drain in  place GU: denies dysuria, urinary frequency MSK: Endorses weakness Skin: denies rashes or wounds, spouse reports healing sacral wound Neurological: denies pain/discomfort Psych: Endorses positive mood Heme/lymph/immuno: denies bruises, abnormal bleeding  Physical Exam: Constitutional: NAD General: Well groomed, cooperative EYES: anicteric sclera, lids intact, no discharge  ENMT: Moist mucous membrane CV: S1 S2, RRR, no LE edema Pulmonary: LCTA, no increased work of breathing, no cough, Abdomen: active BS + 4 quadrants, soft and non tender, biliary drain present GU: no suprapubic tenderness MSK: weakness, limited range of motion, ambulatory with rolling walker Skin: warm and dry, no rashes or wounds on visible skin Neuro:  weakness, otherwise non focal Psych: non-anxious affect Hem/lymph/immuno: no widespread bruising   PAST MEDICAL HISTORY:  Active Ambulatory Problems    Diagnosis Date Noted  . COLONIC POLYPS, ADENOMATOUS 01/31/2009  . HLD (hyperlipidemia) 01/31/2009  . Hypertension 01/31/2009  . Coronary atherosclerosis 01/31/2009  . CORONARY ATHEROSCLEROSIS NATIVE CORONARY ARTERY 06/20/2009  . HEMORRHOIDS 01/31/2009  . DIVERTICULOSIS, MILD 01/31/2009  . ARM NUMBNESS 06/19/2010  . EDEMA 03/13/2010  . FASTING HYPERGLYCEMIA 03/13/2010  . Syncope 07/24/2011  . OSA (obstructive sleep apnea) 11/29/2011  .  Osteoarthritis of right knee 08/09/2016  . SOB (shortness of breath) 08/12/2016  . Tachycardia 08/12/2016  . Low grade fever 08/12/2016  . Right knee pain 01/07/2018  . History of total right knee replacement 01/07/2018  . Precordial pain   . CVA (cerebral vascular accident) ([facility]) 01/12/2021  . Weakness   . Vitamin B12 deficiency 01/27/2021  . 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 Chronic systolic CHF (congestive heart failure) ([place]) 01/27/2021  . Severe sepsis with septic shock ([facility]) 02/06/2021  . Chronic cholecystitis 02/06/2021  . Sacral decubitus  ulcer 02/06/2021  . Severe sepsis with septic shock (CODE) ([facility]) 02/06/2021   Resolved Ambulatory Problems    Diagnosis Date Noted  . No Resolved Ambulatory Problems   Past Medical History:  Diagnosis Date  . Acute MI ([facility])   . Allergy   . Anxiety   . Arthritis   . Coronary artery disease   . Depression   . Diabetes mellitus without complication ([facility])   . Diverticulosis   . Hemorrhoids   . HOH (hard of hearing)   . Hx of colonic polyps   . Hyperlipidemia   . Left ventricular dysfunction     SOCIAL HX:  Social History   Tobacco Use  . Smoking status: Former Smoker    Types: Cigars    Quit date: 12/17/2005    Years since quitting: 15.1  . Smokeless tobacco: Former Systems developer    Quit date: 12/17/2005  Substance Use Topics  . Alcohol use: No     FAMILY HX:  Family History  Problem Relation Age of Onset  . Coronary artery disease Other        positive family hx of  . Colon cancer Neg Hx   . Esophageal cancer Neg Hx   . Liver cancer Neg Hx   . Pancreatic cancer Neg Hx   . Rectal cancer Neg Hx   . Stomach cancer Neg Hx       ALLERGIES:  Allergies  Allergen Reactions  . Penicillins Other (See Comments)    Disputed in 2022- Has tolerated cephalexin in the past  Has patient had a PCN reaction causing immediate rash, facial/tongue/throat swelling, SOB or lightheadedness with hypotension: Unknown Has patient had a PCN reaction causing severe rash involving mucus membranes or skin necrosis: Unknown Has patient had a PCN reaction that required hospitalization: Unknown Has patient had a PCN reaction occurring within the last 10 years: No        PERTINENT MEDICATIONS:  Outpatient Encounter Medications as of 02/21/2021  Medication Sig  . aspirin EC 81 MG tablet Take 1 tablet (81 mg total) by mouth daily. (Patient taking differently: Take 81 mg by mouth at bedtime.)  . cefdinir (OMNICEF) 300 MG capsule Take 1 capsule (300 mg total) by mouth 2 (two) times daily for 12 days.   Marland Kitchen docusate sodium (COLACE) 100 MG capsule Take 2 capsules (200 mg total) by mouth 2 (two) times daily.  . furosemide (LASIX) 40 MG tablet Take 1 tablet (40 mg total) by mouth daily.  . Maltodextrin-Xanthan Gum (RESOURCE THICKENUP CLEAR) POWD Use with every meals. (Patient taking differently: Take 4 g by mouth See admin instructions. Use with every meals.)  . metFORMIN (GLUCOPHAGE) 1000 MG tablet Take 1,000 mg by mouth 2 (two) times daily.  . metroNIDAZOLE (FLAGYL) 500 MG tablet Take 1 tablet (500 mg total) by mouth 3 (three) times daily for 12 days.  . Multiple Vitamin (MULTIVITAMIN WITH MINERALS) TABS tablet Take  1 tablet by mouth daily.  . pantoprazole (PROTONIX) 40 MG tablet Take 1 tablet (40 mg total) by mouth daily.  . QUEtiapine (SEROQUEL) 25 MG tablet Take 1 tablet (25 mg total) by mouth 2 (two) times daily.  . simvastatin (ZOCOR) 40 MG tablet Take 1 tablet (40 mg total) by mouth at bedtime.  . tamsulosin (FLOMAX) 0.4 MG CAPS capsule Take 1 capsule (0.4 mg total) by mouth daily.  Marland Kitchen thiamine 100 MG tablet Take 1 tablet (100 mg total) by mouth daily.  . vitamin B-12 (CYANOCOBALAMIN) 1000 MCG tablet Take 1 tablet (1,000 mcg total) by mouth daily.   No facility-administered encounter medications on file as of 02/21/2021.     Thank you for the opportunity to participate in the care of Mr. Billey.  The palliative care team will continue to follow. Please call our office at [phone] if we can be of additional assistance.   Note: Portions of this note were generated with Lobbyist. Dictation errors may occur despite best attempts at proofreading.  Teodoro Spray, NP

## 2021-02-24 ENCOUNTER — Telehealth: Payer: Self-pay | Admitting: Cardiovascular Disease

## 2021-02-24 NOTE — Telephone Encounter (Signed)
Spoke with patient's wife. The pt is now out of nursing home and back home with her.  Home care has not been arranged yet and she is wondering if she can restart the medications he'd been on previously.  I adv her to follow the medicine list he was sent home on only.  She has no way to check BP or heart rate or O2 sat at home.  I added him to wait list.  He is due to be seen 04/11/21.   Pt's wife voices understanding and appreciation for call back.

## 2021-02-24 NOTE — Telephone Encounter (Signed)
Pt c/o medication issue:  1. Name of Medication: Isosorcidemononoit Metoprolol Barpraate ranipril  2. How are you currently taking this medication (dosage and times per day)? Not currently taking   3. Are you having a reaction (difficulty breathing--STAT)? No  4. What is your medication issue? PT's wife states that after her husband had a stroke the hospital took him off his medication.She states that he has not had these medication in a long time.She wants to know can he start back taking them.

## 2021-02-27 ENCOUNTER — Telehealth: Payer: Self-pay | Admitting: *Deleted

## 2021-02-27 NOTE — Telephone Encounter (Signed)
Received a communication from [facility] that patient's wife called stating that he was recently discharged from a skilled facility rather abruptly, and is requesting a follow up visit. Called and left a voicemail to schedule a palliative care follow up visit. Contact information left for a return call.

## 2021-02-28 DIAGNOSIS — G8929 Other chronic pain: Secondary | ICD-10-CM | POA: Diagnosis not present

## 2021-02-28 DIAGNOSIS — I130 Hypertensive heart and chronic kidney disease with heart failure and stage 1 through stage 4 chronic kidney disease, or unspecified chronic kidney disease: Secondary | ICD-10-CM | POA: Diagnosis not present

## 2021-02-28 DIAGNOSIS — A419 Sepsis, unspecified organism: Secondary | ICD-10-CM | POA: Diagnosis not present

## 2021-02-28 DIAGNOSIS — E1129 Type 2 diabetes mellitus with other diabetic kidney complication: Secondary | ICD-10-CM | POA: Diagnosis not present

## 2021-02-28 DIAGNOSIS — R269 Unspecified abnormalities of gait and mobility: Secondary | ICD-10-CM | POA: Diagnosis not present

## 2021-02-28 DIAGNOSIS — K811 Chronic cholecystitis: Secondary | ICD-10-CM | POA: Diagnosis not present

## 2021-02-28 DIAGNOSIS — K810 Acute cholecystitis: Secondary | ICD-10-CM | POA: Diagnosis not present

## 2021-02-28 DIAGNOSIS — R296 Repeated falls: Secondary | ICD-10-CM | POA: Diagnosis not present

## 2021-02-28 DIAGNOSIS — Z7189 Other specified counseling: Secondary | ICD-10-CM | POA: Diagnosis not present

## 2021-02-28 DIAGNOSIS — K219 Gastro-esophageal reflux disease without esophagitis: Secondary | ICD-10-CM | POA: Diagnosis not present

## 2021-02-28 DIAGNOSIS — G479 Sleep disorder, unspecified: Secondary | ICD-10-CM | POA: Diagnosis not present

## 2021-02-28 DIAGNOSIS — F39 Unspecified mood [affective] disorder: Secondary | ICD-10-CM | POA: Diagnosis not present

## 2021-03-14 ENCOUNTER — Emergency Department (HOSPITAL_COMMUNITY): Payer: HMO

## 2021-03-14 ENCOUNTER — Encounter (HOSPITAL_COMMUNITY): Payer: Self-pay | Admitting: Emergency Medicine

## 2021-03-14 ENCOUNTER — Inpatient Hospital Stay (HOSPITAL_COMMUNITY)
Admission: EM | Admit: 2021-03-14 | Discharge: 2021-03-18 | DRG: 871 | Disposition: A | Discharge status: Home Health | Payer: HMO | Attending: Internal Medicine | Admitting: Internal Medicine

## 2021-03-14 ENCOUNTER — Other Ambulatory Visit: Payer: Self-pay

## 2021-03-14 DIAGNOSIS — Z7982 Long term (current) use of aspirin: Secondary | ICD-10-CM

## 2021-03-14 DIAGNOSIS — M25561 Pain in right knee: Secondary | ICD-10-CM | POA: Diagnosis present

## 2021-03-14 DIAGNOSIS — R000 Tachycardia, unspecified: Secondary | ICD-10-CM | POA: Diagnosis not present

## 2021-03-14 DIAGNOSIS — R627 Adult failure to thrive: Secondary | ICD-10-CM | POA: Diagnosis present

## 2021-03-14 DIAGNOSIS — E139 Other specified diabetes mellitus without complications: Secondary | ICD-10-CM

## 2021-03-14 DIAGNOSIS — Z6830 Body mass index (BMI) 30.0-30.9, adult: Secondary | ICD-10-CM

## 2021-03-14 DIAGNOSIS — N179 Acute kidney failure, unspecified: Secondary | ICD-10-CM | POA: Diagnosis present

## 2021-03-14 DIAGNOSIS — I69311 Memory deficit following cerebral infarction: Secondary | ICD-10-CM

## 2021-03-14 DIAGNOSIS — Z8249 Family history of ischemic heart disease and other diseases of the circulatory system: Secondary | ICD-10-CM | POA: Diagnosis not present

## 2021-03-14 DIAGNOSIS — E876 Hypokalemia: Secondary | ICD-10-CM | POA: Diagnosis not present

## 2021-03-14 DIAGNOSIS — G9341 Metabolic encephalopathy: Secondary | ICD-10-CM | POA: Diagnosis present

## 2021-03-14 DIAGNOSIS — W19XXXA Unspecified fall, initial encounter: Secondary | ICD-10-CM | POA: Diagnosis present

## 2021-03-14 DIAGNOSIS — Z9181 History of falling: Secondary | ICD-10-CM

## 2021-03-14 DIAGNOSIS — F419 Anxiety disorder, unspecified: Secondary | ICD-10-CM | POA: Diagnosis not present

## 2021-03-14 DIAGNOSIS — N390 Urinary tract infection, site not specified: Secondary | ICD-10-CM | POA: Diagnosis present

## 2021-03-14 DIAGNOSIS — I2510 Atherosclerotic heart disease of native coronary artery without angina pectoris: Secondary | ICD-10-CM | POA: Diagnosis not present

## 2021-03-14 DIAGNOSIS — Z87891 Personal history of nicotine dependence: Secondary | ICD-10-CM

## 2021-03-14 DIAGNOSIS — I639 Cerebral infarction, unspecified: Secondary | ICD-10-CM | POA: Diagnosis present

## 2021-03-14 DIAGNOSIS — Z7401 Bed confinement status: Secondary | ICD-10-CM

## 2021-03-14 DIAGNOSIS — E119 Type 2 diabetes mellitus without complications: Secondary | ICD-10-CM | POA: Diagnosis not present

## 2021-03-14 DIAGNOSIS — I5022 Chronic systolic (congestive) heart failure: Secondary | ICD-10-CM

## 2021-03-14 DIAGNOSIS — R5381 Other malaise: Secondary | ICD-10-CM | POA: Diagnosis not present

## 2021-03-14 DIAGNOSIS — R6520 Severe sepsis without septic shock: Secondary | ICD-10-CM | POA: Diagnosis not present

## 2021-03-14 DIAGNOSIS — M25562 Pain in left knee: Secondary | ICD-10-CM | POA: Diagnosis not present

## 2021-03-14 DIAGNOSIS — M1711 Unilateral primary osteoarthritis, right knee: Secondary | ICD-10-CM | POA: Diagnosis not present

## 2021-03-14 DIAGNOSIS — N319 Neuromuscular dysfunction of bladder, unspecified: Secondary | ICD-10-CM | POA: Diagnosis present

## 2021-03-14 DIAGNOSIS — Z20822 Contact with and (suspected) exposure to covid-19: Secondary | ICD-10-CM | POA: Diagnosis not present

## 2021-03-14 DIAGNOSIS — N3001 Acute cystitis with hematuria: Secondary | ICD-10-CM

## 2021-03-14 DIAGNOSIS — Z7984 Long term (current) use of oral hypoglycemic drugs: Secondary | ICD-10-CM

## 2021-03-14 DIAGNOSIS — E871 Hypo-osmolality and hyponatremia: Secondary | ICD-10-CM | POA: Diagnosis present

## 2021-03-14 DIAGNOSIS — Z66 Do not resuscitate: Secondary | ICD-10-CM | POA: Diagnosis not present

## 2021-03-14 DIAGNOSIS — N400 Enlarged prostate without lower urinary tract symptoms: Secondary | ICD-10-CM | POA: Diagnosis present

## 2021-03-14 DIAGNOSIS — K811 Chronic cholecystitis: Secondary | ICD-10-CM | POA: Diagnosis present

## 2021-03-14 DIAGNOSIS — I69322 Dysarthria following cerebral infarction: Secondary | ICD-10-CM | POA: Diagnosis not present

## 2021-03-14 DIAGNOSIS — Z1624 Resistance to multiple antibiotics: Secondary | ICD-10-CM | POA: Diagnosis present

## 2021-03-14 DIAGNOSIS — F32A Depression, unspecified: Secondary | ICD-10-CM | POA: Diagnosis not present

## 2021-03-14 DIAGNOSIS — I5043 Acute on chronic combined systolic (congestive) and diastolic (congestive) heart failure: Secondary | ICD-10-CM | POA: Diagnosis not present

## 2021-03-14 DIAGNOSIS — A419 Sepsis, unspecified organism: Secondary | ICD-10-CM

## 2021-03-14 DIAGNOSIS — A4151 Sepsis due to Escherichia coli [E. coli]: Principal | ICD-10-CM | POA: Diagnosis present

## 2021-03-14 DIAGNOSIS — Z955 Presence of coronary angioplasty implant and graft: Secondary | ICD-10-CM

## 2021-03-14 DIAGNOSIS — G9340 Encephalopathy, unspecified: Secondary | ICD-10-CM | POA: Insufficient documentation

## 2021-03-14 DIAGNOSIS — Z96651 Presence of right artificial knee joint: Secondary | ICD-10-CM | POA: Diagnosis present

## 2021-03-14 DIAGNOSIS — I252 Old myocardial infarction: Secondary | ICD-10-CM

## 2021-03-14 DIAGNOSIS — R410 Disorientation, unspecified: Secondary | ICD-10-CM | POA: Diagnosis not present

## 2021-03-14 DIAGNOSIS — R634 Abnormal weight loss: Secondary | ICD-10-CM | POA: Diagnosis not present

## 2021-03-14 DIAGNOSIS — Z79899 Other long term (current) drug therapy: Secondary | ICD-10-CM

## 2021-03-14 DIAGNOSIS — R4182 Altered mental status, unspecified: Secondary | ICD-10-CM | POA: Diagnosis not present

## 2021-03-14 DIAGNOSIS — S31010A Laceration without foreign body of lower back and pelvis without penetration into retroperitoneum, initial encounter: Secondary | ICD-10-CM | POA: Diagnosis present

## 2021-03-14 DIAGNOSIS — Z7189 Other specified counseling: Secondary | ICD-10-CM | POA: Diagnosis not present

## 2021-03-14 DIAGNOSIS — E860 Dehydration: Secondary | ICD-10-CM | POA: Diagnosis not present

## 2021-03-14 DIAGNOSIS — I5042 Chronic combined systolic (congestive) and diastolic (congestive) heart failure: Secondary | ICD-10-CM | POA: Diagnosis not present

## 2021-03-14 DIAGNOSIS — R531 Weakness: Secondary | ICD-10-CM | POA: Diagnosis not present

## 2021-03-14 LAB — URINALYSIS, ROUTINE W REFLEX MICROSCOPIC
Bilirubin Urine: NEGATIVE
Glucose, UA: 150 mg/dL — AB
Ketones, ur: 5 mg/dL — AB
Nitrite: NEGATIVE
Protein, ur: 30 mg/dL — AB
Specific Gravity, Urine: 1.008 (ref 1.005–1.030)
WBC, UA: >50 WBC/hpf — ABNORMAL HIGH (ref 0–5)
pH: 5 (ref 5.0–8.0)

## 2021-03-14 LAB — COMPREHENSIVE METABOLIC PANEL
ALT: 9 U/L (ref 0–44)
AST: 16 U/L (ref 15–41)
Albumin: 3.3 g/dL — ABNORMAL LOW (ref 3.5–5.0)
Alkaline Phosphatase: 141 U/L — ABNORMAL HIGH (ref 38–126)
Anion gap: 20 — ABNORMAL HIGH (ref 5–15)
BUN: 33 mg/dL — ABNORMAL HIGH (ref 8–23)
CO2: 19 mmol/L — ABNORMAL LOW (ref 22–32)
Calcium: 9.6 mg/dL (ref 8.9–10.3)
Chloride: 95 mmol/L — ABNORMAL LOW (ref 98–111)
Creatinine, Ser: 1.33 mg/dL — ABNORMAL HIGH (ref 0.61–1.24)
GFR, Estimated: 58 mL/min — ABNORMAL LOW (ref >60)
Glucose, Bld: 138 mg/dL — ABNORMAL HIGH (ref 70–99)
Potassium: 3.3 mmol/L — ABNORMAL LOW (ref 3.5–5.1)
Sodium: 134 mmol/L — ABNORMAL LOW (ref 135–145)
Total Bilirubin: 1.1 mg/dL (ref 0.3–1.2)
Total Protein: 7.7 g/dL (ref 6.5–8.1)

## 2021-03-14 LAB — LIPASE, BLOOD: Lipase: 23 U/L (ref 11–51)

## 2021-03-14 LAB — CBC WITH DIFFERENTIAL/PLATELET
Abs Immature Granulocytes: 0.1 10*3/uL — ABNORMAL HIGH (ref 0.00–0.07)
Basophils Absolute: 0 10*3/uL (ref 0.0–0.1)
Basophils Relative: 0 %
Eosinophils Absolute: 0 10*3/uL (ref 0.0–0.5)
Eosinophils Relative: 0 %
HCT: 42.4 % (ref 39.0–52.0)
Hemoglobin: 13.9 g/dL (ref 13.0–17.0)
Immature Granulocytes: 1 %
Lymphocytes Relative: 12 %
Lymphs Abs: 1.8 10*3/uL (ref 0.7–4.0)
MCH: 28.8 pg (ref 26.0–34.0)
MCHC: 32.8 g/dL (ref 30.0–36.0)
MCV: 88 fL (ref 80.0–100.0)
Monocytes Absolute: 1.2 10*3/uL — ABNORMAL HIGH (ref 0.1–1.0)
Monocytes Relative: 8 %
Neutro Abs: 12.2 10*3/uL — ABNORMAL HIGH (ref 1.7–7.7)
Neutrophils Relative %: 79 %
Platelets: 296 10*3/uL (ref 150–400)
RBC: 4.82 MIL/uL (ref 4.22–5.81)
RDW: 16.4 % — ABNORMAL HIGH (ref 11.5–15.5)
WBC: 15.3 10*3/uL — ABNORMAL HIGH (ref 4.0–10.5)
nRBC: 0 % (ref 0.0–0.2)

## 2021-03-14 LAB — RESP PANEL BY RT-PCR (FLU A&B, COVID) ARPGX2
Influenza A by PCR: NEGATIVE
Influenza B by PCR: NEGATIVE
SARS Coronavirus 2 by RT PCR: NEGATIVE

## 2021-03-14 LAB — AMMONIA: Ammonia: 21 umol/L (ref 9–35)

## 2021-03-14 LAB — BLOOD GAS, VENOUS
Acid-base deficit: 4.8 mmol/L — ABNORMAL HIGH (ref 0.0–2.0)
Bicarbonate: 19.6 mmol/L — ABNORMAL LOW (ref 20.0–28.0)
O2 Saturation: 86 %
Patient temperature: 98.6
pCO2, Ven: 35.9 mmHg — ABNORMAL LOW (ref 44.0–60.0)
pH, Ven: 7.356 (ref 7.250–7.430)
pO2, Ven: 56.8 mmHg — ABNORMAL HIGH (ref 32.0–45.0)

## 2021-03-14 LAB — RAPID URINE DRUG SCREEN, HOSP PERFORMED
Amphetamines: NOT DETECTED
Barbiturates: NOT DETECTED
Benzodiazepines: POSITIVE — AB
Cocaine: NOT DETECTED
Opiates: NOT DETECTED
Tetrahydrocannabinol: NOT DETECTED

## 2021-03-14 LAB — ETHANOL: Alcohol, Ethyl (B): <10 mg/dL (ref <10)

## 2021-03-14 IMAGING — DX DG KNEE COMPLETE 4+V*L*
4 series · 4 of 4 positions shown · non-contrast
Comparison: None.

CLINICAL DATA: Left knee pain.

EXAM:
LEFT KNEE - COMPLETE 4+ VIEW

[x knee ap left (1 of 2)]
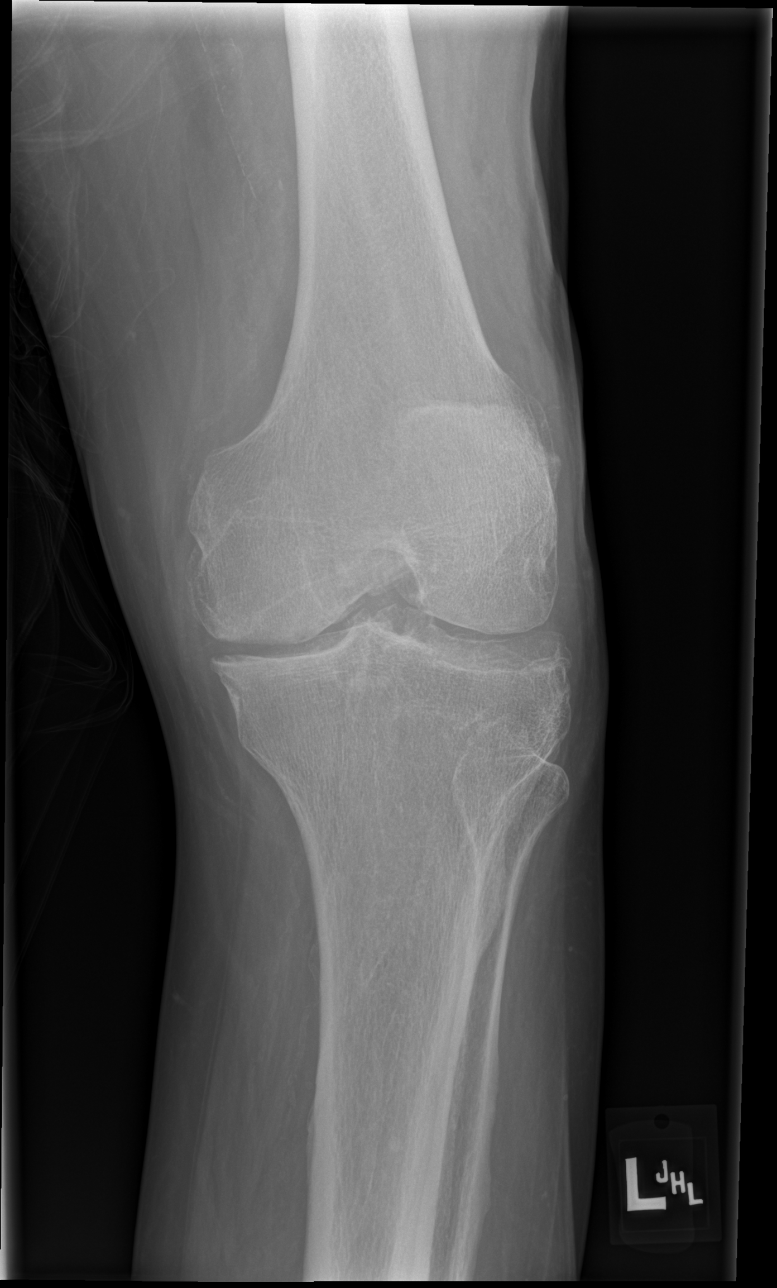

[knee obl]
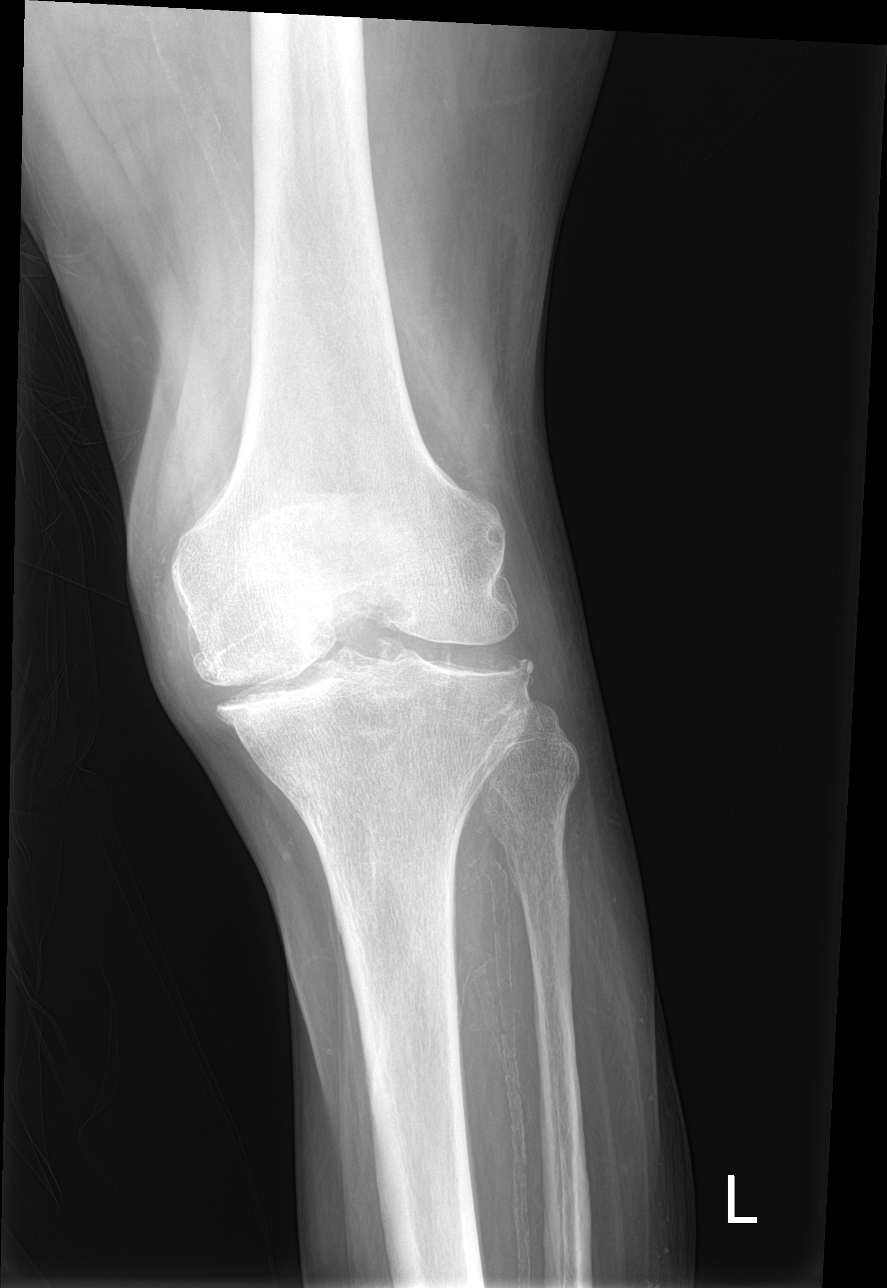

[x knee ap left (2 of 2)]
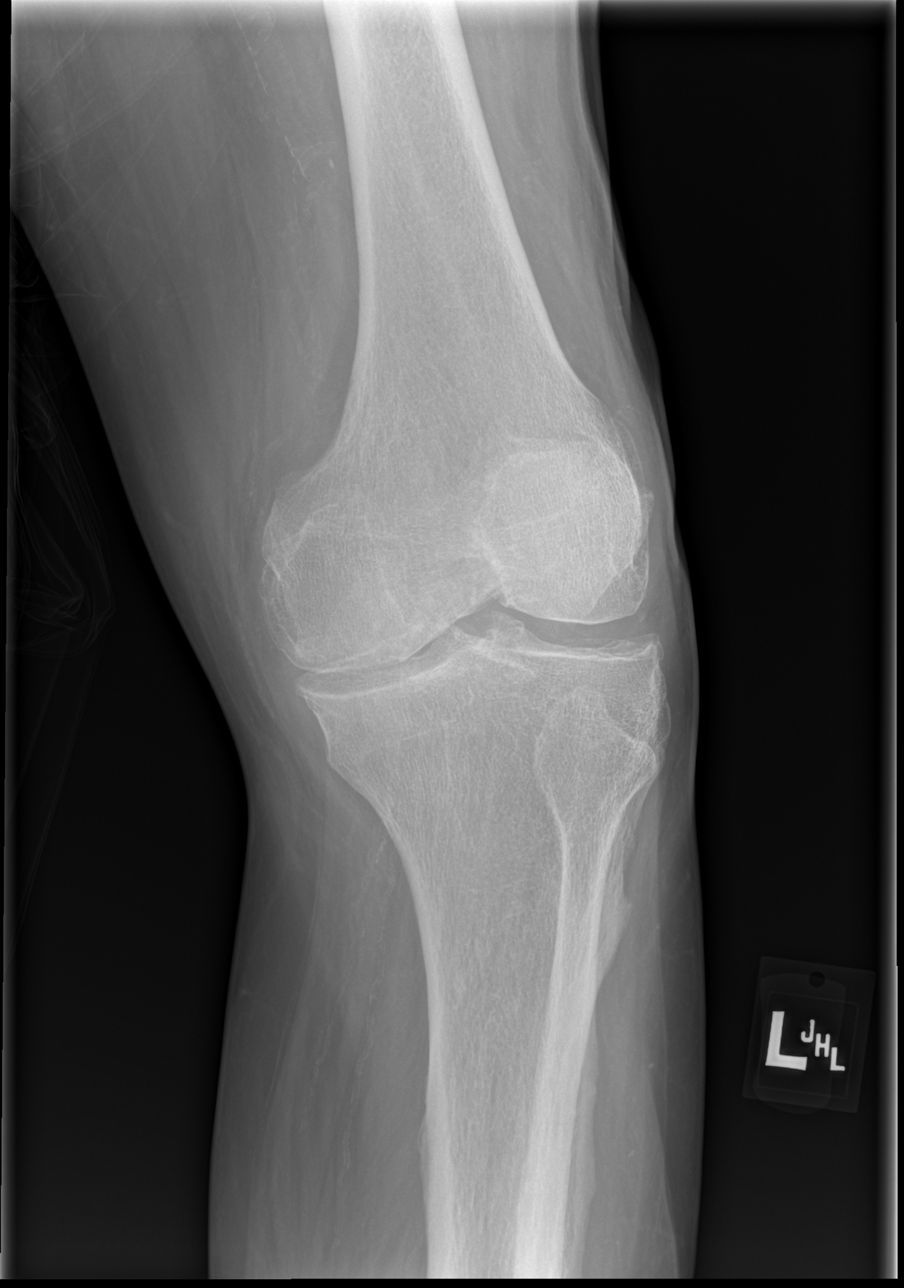

[x knee lat left]
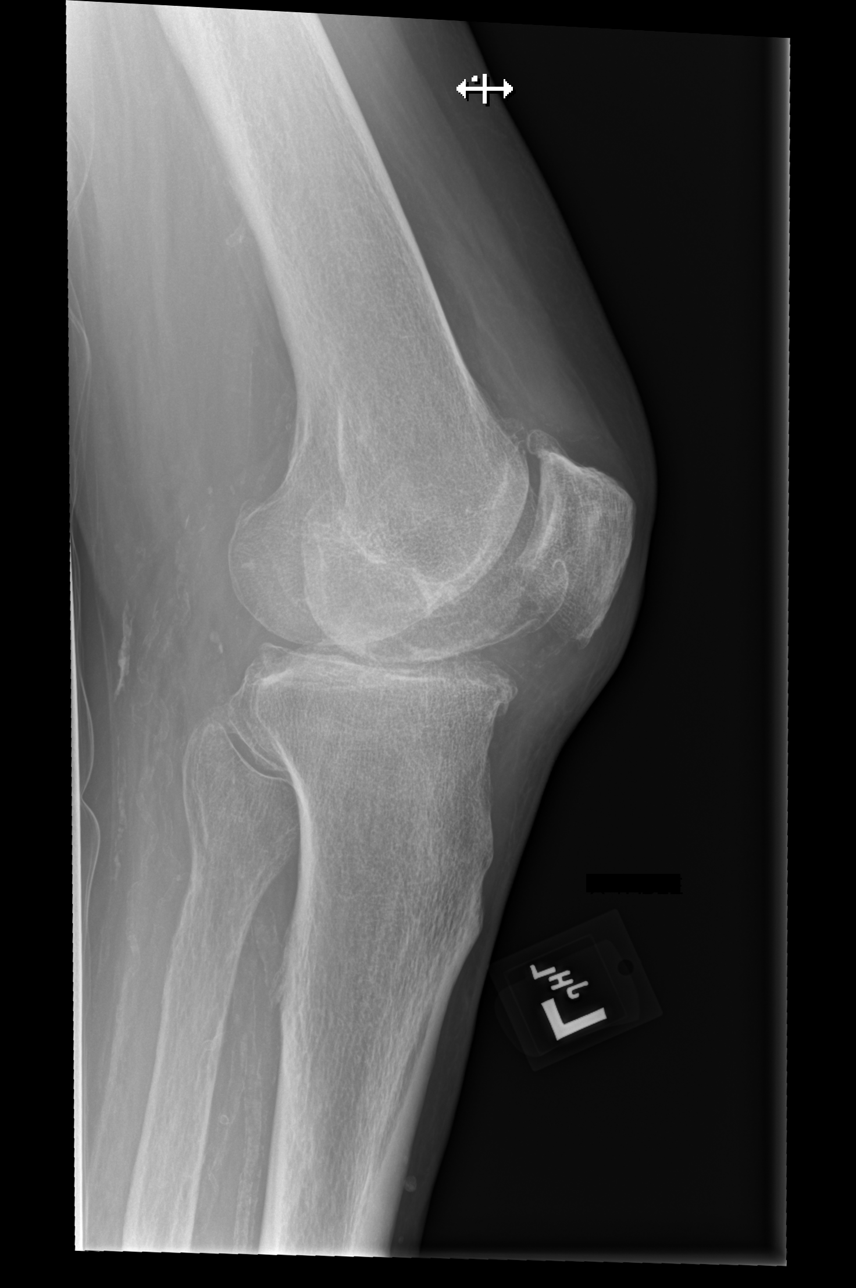

[4 of 4 positions shown; findings below may reference images not displayed]

FINDINGS: No evidence of fracture, dislocation, or joint effusion. Medial
tibiofemoral joint space narrowing. Moderate tricompartmental
peripheral spurring. No erosion or bony destruction. There are
vascular calcifications.
IMPRESSION: Moderate tricompartmental osteoarthritis, most prominent in the
medial tibiofemoral compartment.

## 2021-03-14 IMAGING — CT CT HEAD W/O CM
3 series · 15 of 47 positions shown, 18 images · non-contrast
Comparison: CT [DATE]

CLINICAL DATA: Delirium. Generalized weakness. Progressive
confusion and slurring of speech.

EXAM:
CT HEAD WITHOUT CONTRAST
TECHNIQUE: Contiguous axial images were obtained from the base of the skull
through the vertex without intravenous contrast.

[Series 2: head wo · axial · 0.44mm/px · z∈[-296,-151]mm · 9 of 35 slices shown, 12 images]
[im 3/35  brain]
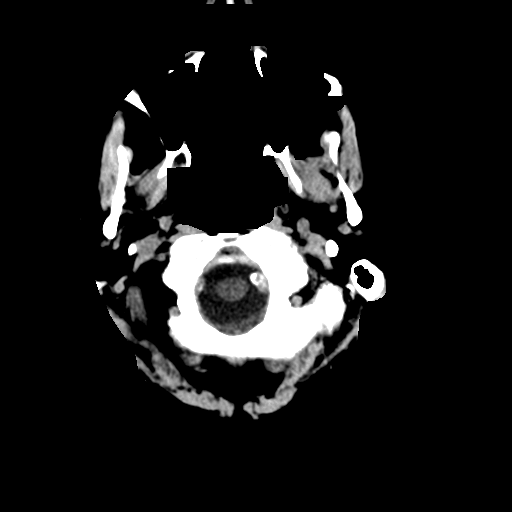
[im 3/35  bone]
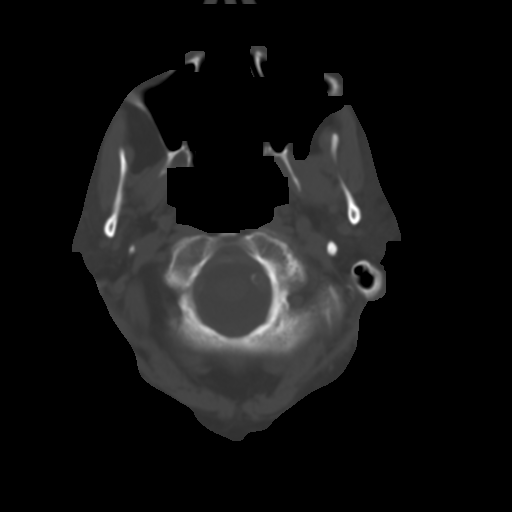
[im 6/35  brain]
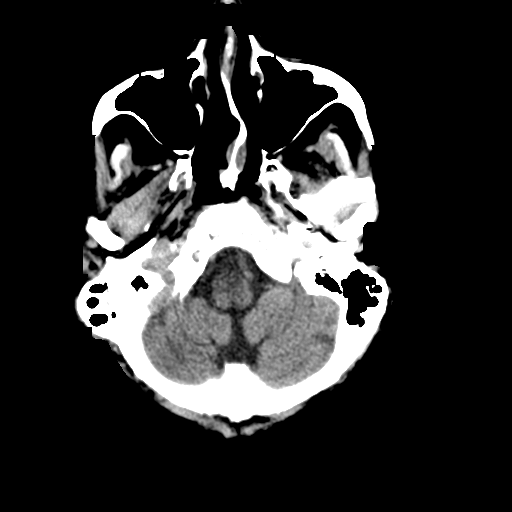
[im 10/35  brain]
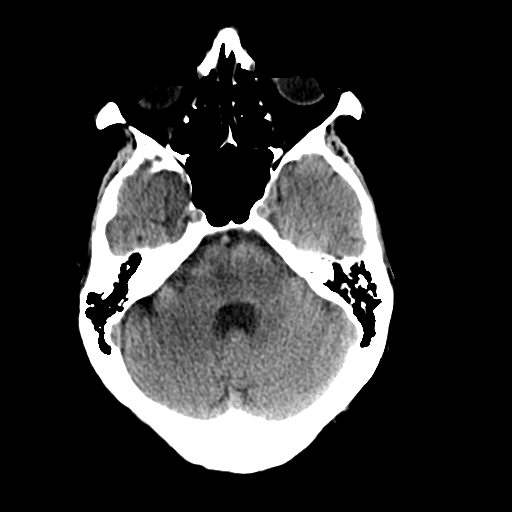
[im 13/35  brain]
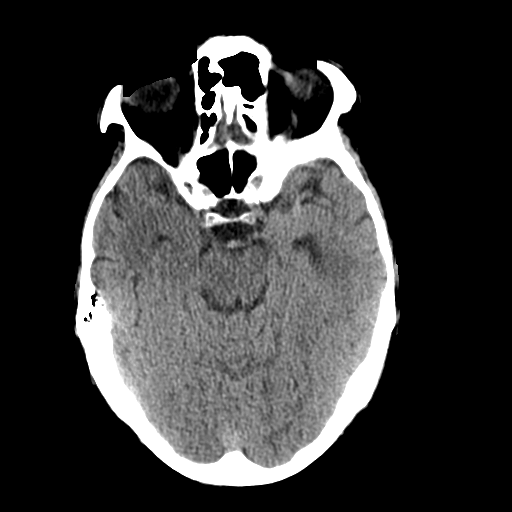
[im 18/35  brain]
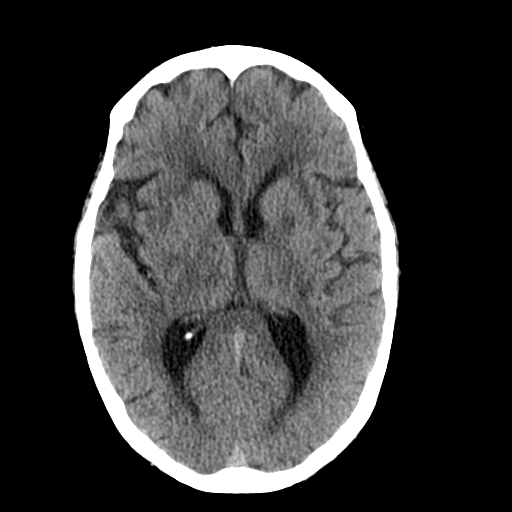
[im 18/35  bone]
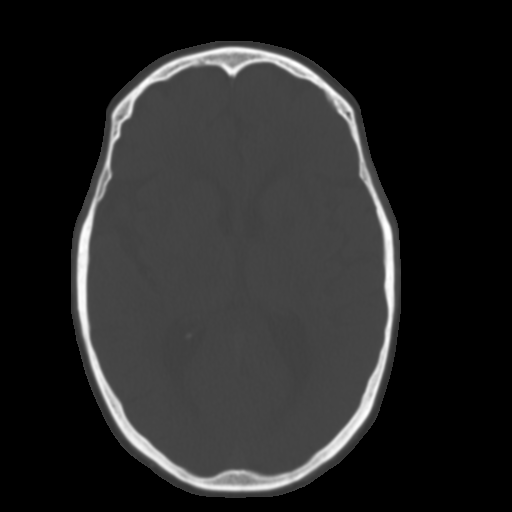
[im 22/35  brain]
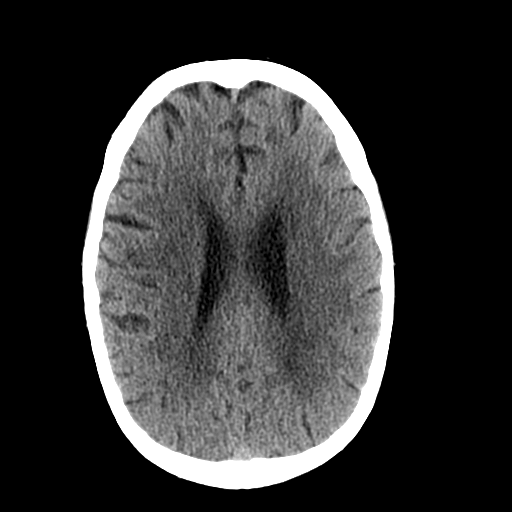
[im 25/35  brain]
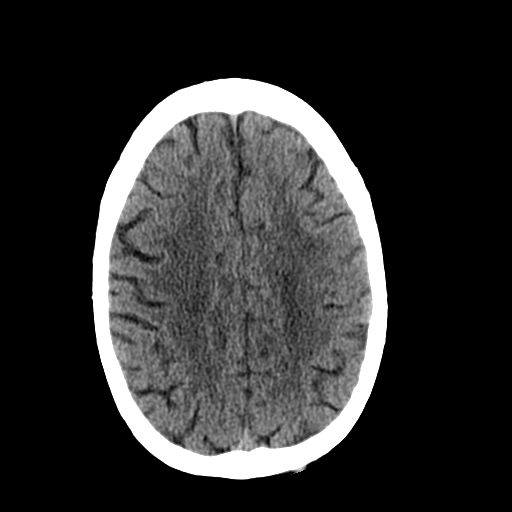
[im 29/35  brain]
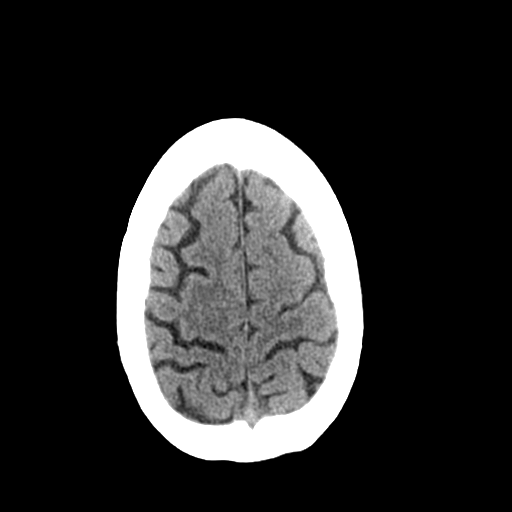
[im 32/35  brain]
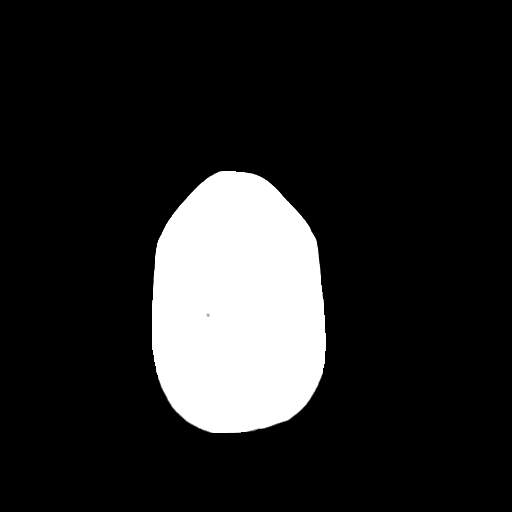
[im 32/35  bone]
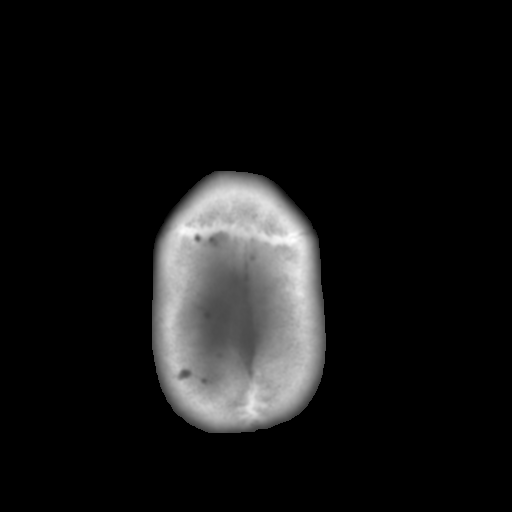

[Series 5: coronal soft tissue · coronal · 0.31mm/px · 3 of 73 slices shown]
[im 29/73  brain]
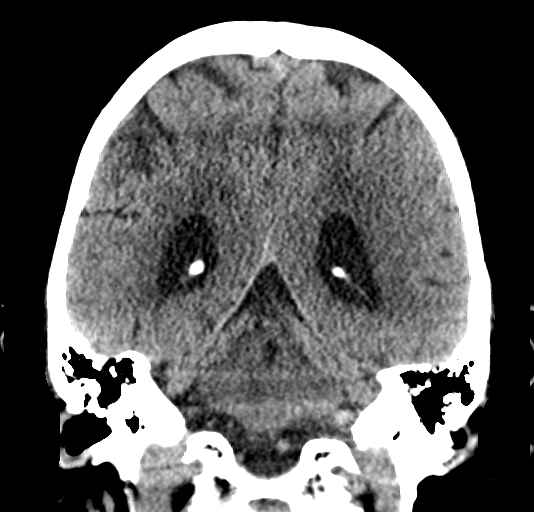
[im 34/73  brain]
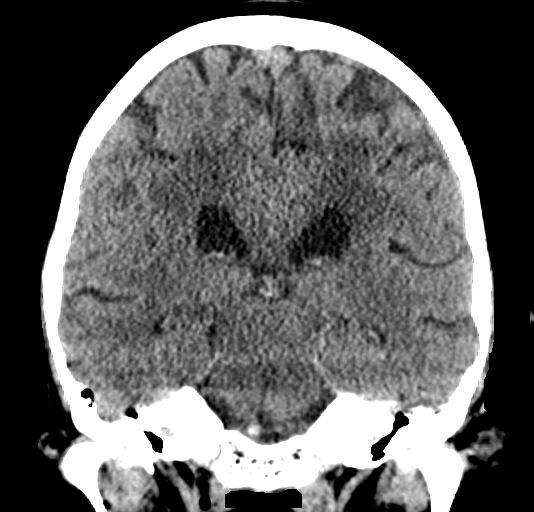
[im 39/73  brain]
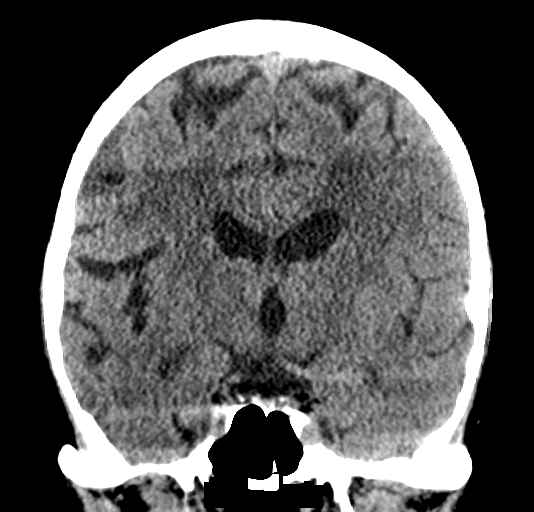

[Series 6: sagittal soft tissue · sagittal · 0.31mm/px · 3 of 57 slices shown]
[im 19/57  brain]
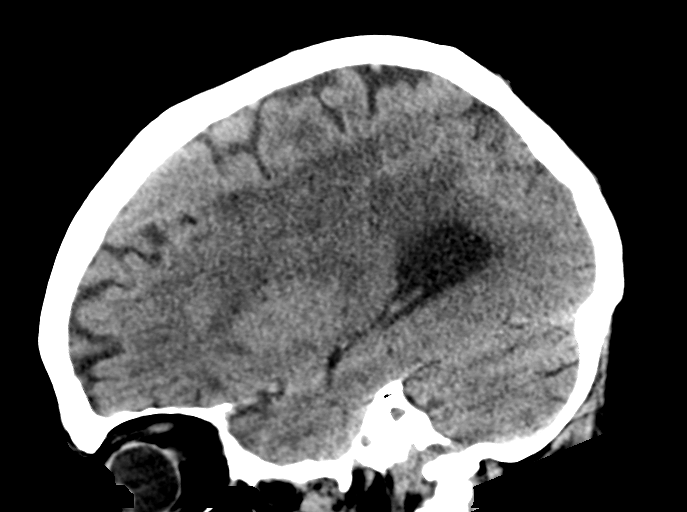
[im 29/57  brain]
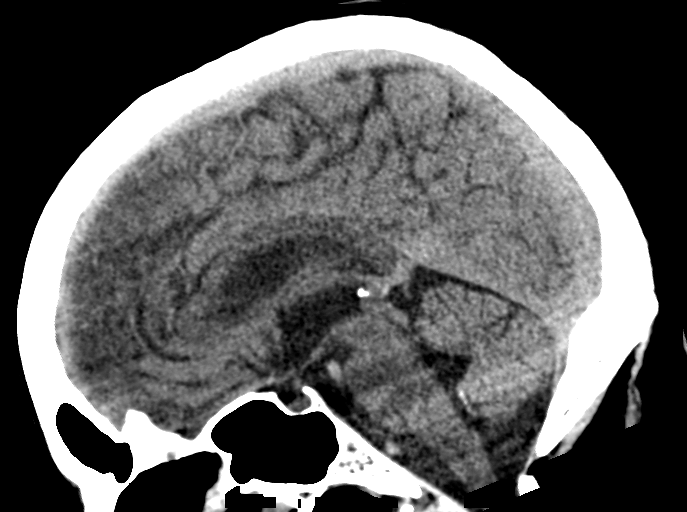
[im 38/57  brain]
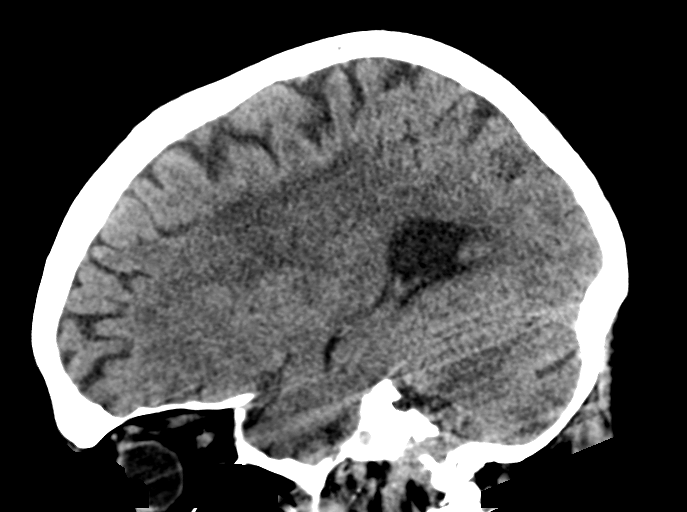

[15 of 47 positions shown; findings below may reference images not displayed]

FINDINGS: Brain: No evidence of acute infarction, hemorrhage, hydrocephalus,
extra-axial collection or mass lesion/mass effect. There is moderate
patchy low-attenuation within the subcortical and periventricular
white matter compatible with chronic microvascular disease. Remote
left cerebellar infarct appears unchanged.

Vascular: No hyperdense vessel or unexpected calcification.

Skull: Normal. Negative for fracture or focal lesion.

Sinuses/Orbits: No acute finding.

Other: None
IMPRESSION: 1. No acute intracranial abnormalities.
2. Chronic small vessel ischemic disease and brain atrophy.

## 2021-03-14 IMAGING — CR DG CHEST 2V
2 series · 2 of 2 positions shown · non-contrast
Comparison: [DATE] chest radiograph.

CLINICAL DATA: Generalized weakness

EXAM:
CHEST - 2 VIEW

[w chest lat]
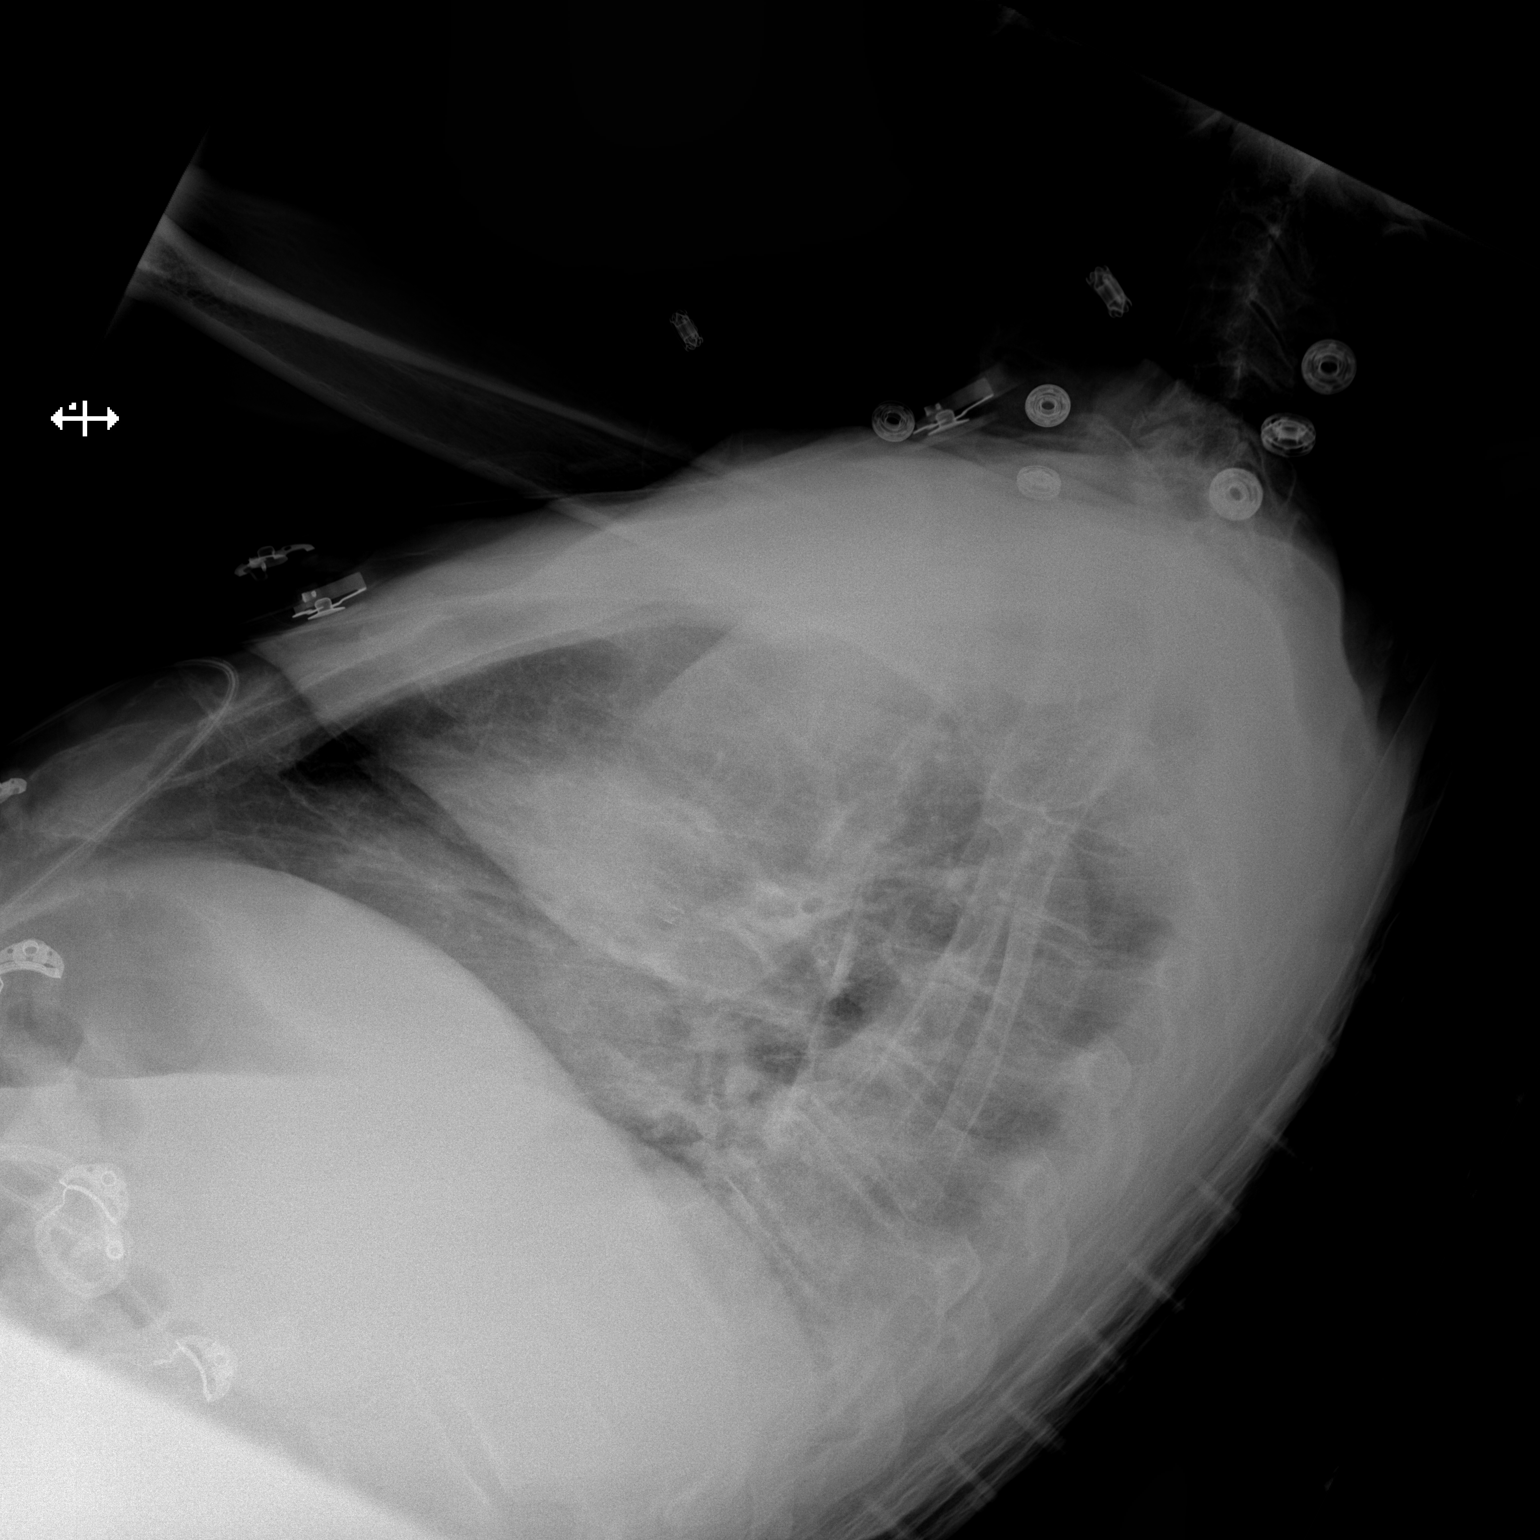

[x chest ap]
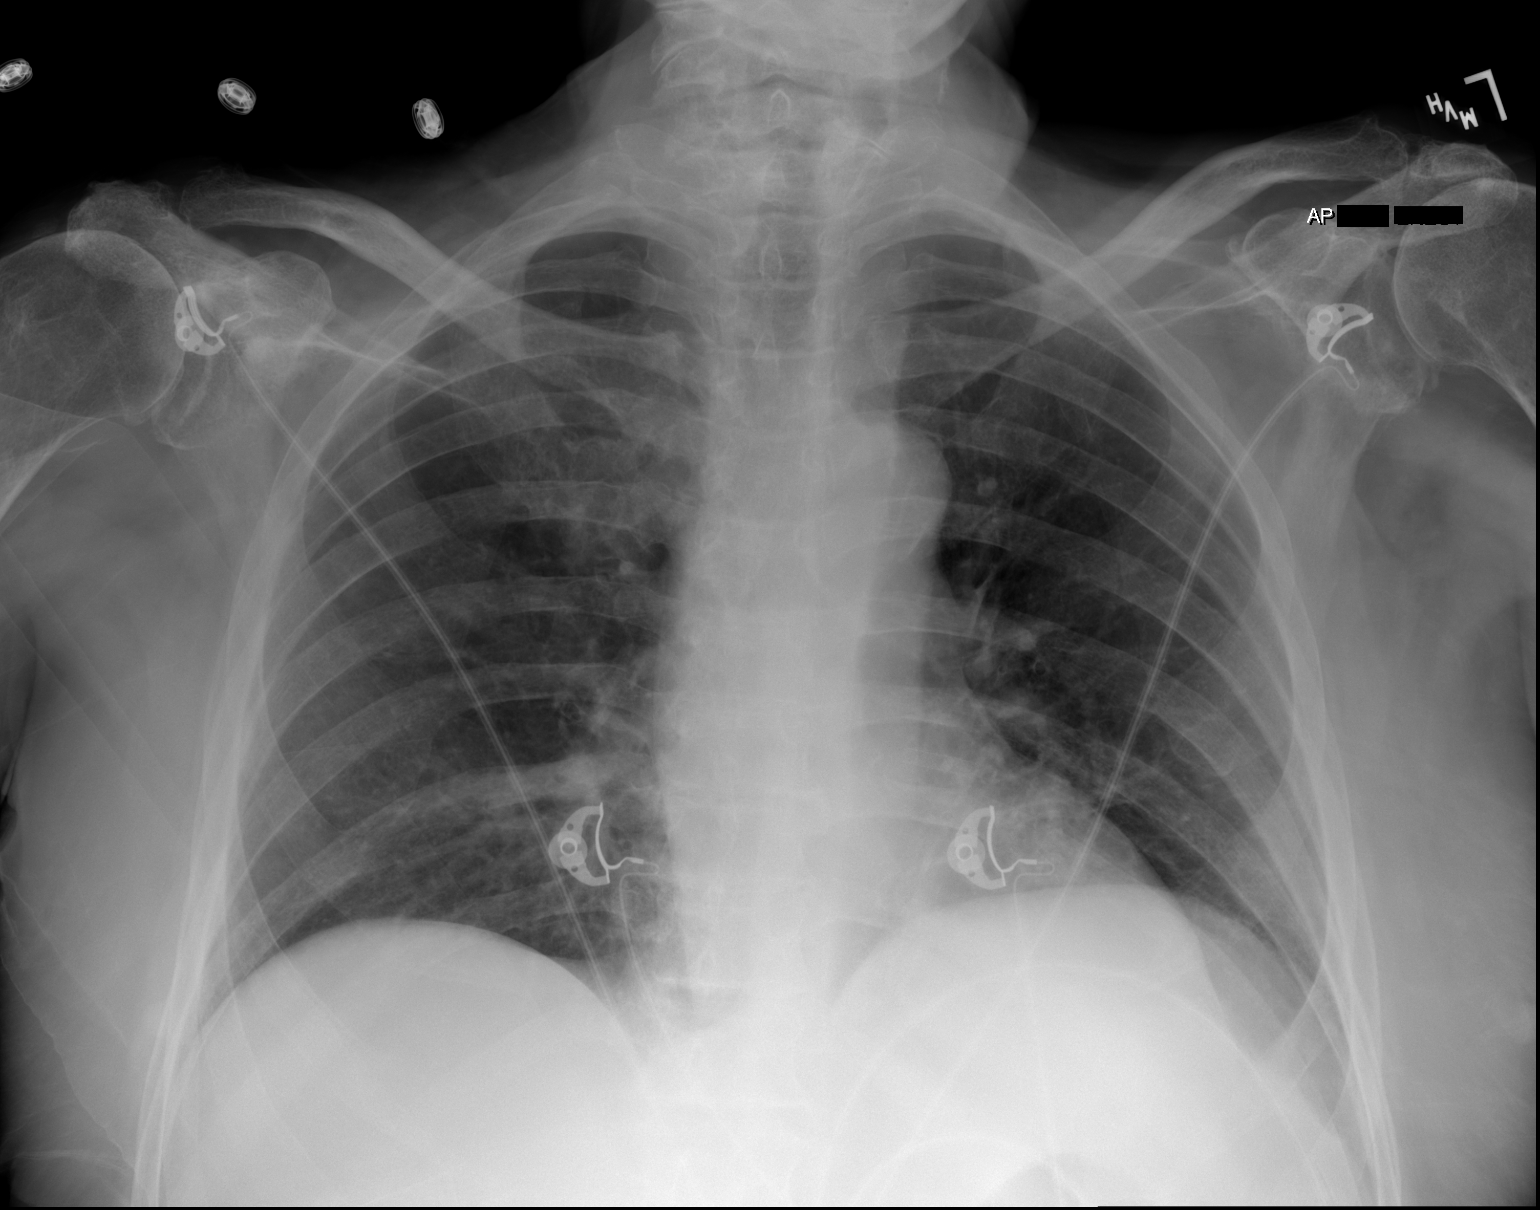

[2 of 2 positions shown; findings below may reference images not displayed]

FINDINGS: Stable cardiomediastinal silhouette with normal heart size. No
pneumothorax. No pleural effusion. Lungs appear clear, with no acute
consolidative airspace disease and no pulmonary edema.
IMPRESSION: No active cardiopulmonary disease.

## 2021-03-14 MED ORDER — ACETAMINOPHEN 325 MG PO TABS: 650.0000 mg | ORAL_TABLET | Freq: Four times a day (QID) | ORAL | Status: DC | PRN

## 2021-03-14 MED ORDER — ONDANSETRON HCL 4 MG/2ML IJ SOLN: 4.0000 mg | Freq: Four times a day (QID) | INTRAMUSCULAR | Status: DC | PRN

## 2021-03-14 MED ORDER — INSULIN ASPART 100 UNIT/ML IJ SOLN
0.0000 [IU] | Freq: Every day | INTRAMUSCULAR | Status: DC
  Filled 2021-03-14: qty 0.05

## 2021-03-14 MED ORDER — INSULIN ASPART 100 UNIT/ML IJ SOLN
0.0000 [IU] | Freq: Three times a day (TID) | INTRAMUSCULAR | Status: DC
  Administered 2021-03-15 (×2): 1 [IU] via SUBCUTANEOUS
  Administered 2021-03-15: 2 [IU] via SUBCUTANEOUS
  Administered 2021-03-16 – 2021-03-17 (×3): 1 [IU] via SUBCUTANEOUS
  Filled 2021-03-14: qty 0.09

## 2021-03-14 MED ORDER — RESOURCE THICKENUP CLEAR PO POWD: 4.0000 g | ORAL | Status: DC

## 2021-03-14 MED ORDER — LORAZEPAM 1 MG PO TABS
1.0000 mg | ORAL_TABLET | Freq: Two times a day (BID) | ORAL | Status: DC | PRN
  Administered 2021-03-15 – 2021-03-16 (×3): 1 mg via ORAL
  Filled 2021-03-14 (×3): qty 1

## 2021-03-14 MED ORDER — PANTOPRAZOLE SODIUM 40 MG PO TBEC
40.0000 mg | DELAYED_RELEASE_TABLET | Freq: Every day | ORAL | Status: DC
  Administered 2021-03-15 – 2021-03-18 (×4): 40 mg via ORAL
  Filled 2021-03-14 (×4): qty 1

## 2021-03-14 MED ORDER — SODIUM CHLORIDE 0.9 % IV SOLN
1.0000 g | Freq: Once | INTRAVENOUS | Status: AC
  Administered 2021-03-15: 1 g via INTRAVENOUS
  Filled 2021-03-14: qty 10

## 2021-03-14 MED ORDER — SODIUM CHLORIDE 0.9 % IV BOLUS
500.0000 mL | Freq: Once | INTRAVENOUS | Status: AC
  Administered 2021-03-14: 500 mL via INTRAVENOUS

## 2021-03-14 MED ORDER — SENNOSIDES-DOCUSATE SODIUM 8.6-50 MG PO TABS: 1.0000 | ORAL_TABLET | Freq: Every evening | ORAL | Status: DC | PRN

## 2021-03-14 MED ORDER — SODIUM CHLORIDE 0.9 % IV SOLN
1.0000 g | INTRAVENOUS | Status: DC
  Administered 2021-03-15: 1 g via INTRAVENOUS
  Filled 2021-03-14 (×3): qty 10

## 2021-03-14 MED ORDER — ONDANSETRON HCL 4 MG PO TABS: 4.0000 mg | ORAL_TABLET | Freq: Four times a day (QID) | ORAL | Status: DC | PRN

## 2021-03-14 MED ORDER — ASPIRIN EC 81 MG PO TBEC
81.0000 mg | DELAYED_RELEASE_TABLET | Freq: Every day | ORAL | Status: DC
  Administered 2021-03-15 – 2021-03-17 (×3): 81 mg via ORAL
  Filled 2021-03-14 (×3): qty 1

## 2021-03-14 MED ORDER — BISACODYL 5 MG PO TBEC: 5.0000 mg | DELAYED_RELEASE_TABLET | Freq: Every day | ORAL | Status: DC | PRN

## 2021-03-14 MED ORDER — HEPARIN SODIUM (PORCINE) 5000 UNIT/ML IJ SOLN
5000.0000 [IU] | Freq: Three times a day (TID) | INTRAMUSCULAR | Status: DC
  Administered 2021-03-15 – 2021-03-17 (×8): 5000 [IU] via SUBCUTANEOUS
  Filled 2021-03-14 (×8): qty 1

## 2021-03-14 MED ORDER — ACETAMINOPHEN 650 MG RE SUPP: 650.0000 mg | Freq: Four times a day (QID) | RECTAL | Status: DC | PRN

## 2021-03-14 MED ORDER — QUETIAPINE FUMARATE 25 MG PO TABS
25.0000 mg | ORAL_TABLET | Freq: Every day | ORAL | Status: DC
  Administered 2021-03-15 – 2021-03-17 (×3): 25 mg via ORAL
  Filled 2021-03-14 (×4): qty 1

## 2021-03-14 MED ORDER — SODIUM CHLORIDE 0.9 % IV SOLN: INTRAVENOUS | Status: DC

## 2021-03-14 MED ORDER — TRAZODONE HCL 50 MG PO TABS
50.0000 mg | ORAL_TABLET | Freq: Every day | ORAL | Status: DC
  Administered 2021-03-15 – 2021-03-17 (×4): 50 mg via ORAL
  Filled 2021-03-14 (×4): qty 1

## 2021-03-14 MED ORDER — QUETIAPINE FUMARATE 50 MG PO TABS: 25.0000 mg | ORAL_TABLET | Freq: Two times a day (BID) | ORAL | Status: DC

## 2021-03-14 MED ORDER — LORAZEPAM 1 MG PO TABS: 1.0000 mg | ORAL_TABLET | Freq: Three times a day (TID) | ORAL | Status: DC | PRN

## 2021-03-14 MED ORDER — POTASSIUM CHLORIDE 10 MEQ/100ML IV SOLN
10.0000 meq | INTRAVENOUS | Status: AC
  Administered 2021-03-15: 10 meq via INTRAVENOUS
  Filled 2021-03-14: qty 100

## 2021-03-14 MED ORDER — SODIUM CHLORIDE 0.9 % IV SOLN: INTRAVENOUS | Status: AC

## 2021-03-14 MED ORDER — THIAMINE HCL 100 MG PO TABS
100.0000 mg | ORAL_TABLET | Freq: Every day | ORAL | Status: DC
  Administered 2021-03-15 – 2021-03-18 (×4): 100 mg via ORAL
  Filled 2021-03-14 (×4): qty 1

## 2021-03-14 MED ORDER — SERTRALINE HCL 25 MG PO TABS
25.0000 mg | ORAL_TABLET | Freq: Every day | ORAL | Status: DC
  Administered 2021-03-15 – 2021-03-18 (×4): 25 mg via ORAL
  Filled 2021-03-14 (×4): qty 1

## 2021-03-14 MED ORDER — FOOD THICKENER (SIMPLYTHICK)
1.0000 | ORAL | Status: DC | PRN
  Filled 2021-03-14 (×2): qty 1

## 2021-03-14 MED ORDER — SIMVASTATIN 40 MG PO TABS
40.0000 mg | ORAL_TABLET | Freq: Every day | ORAL | Status: DC
  Administered 2021-03-15 – 2021-03-17 (×3): 40 mg via ORAL
  Filled 2021-03-14 (×3): qty 1

## 2021-03-14 NOTE — H&P (Signed)
History and Physical    BETZALEL UMBARGER INO:[id number] DOB: 07/26/1951 DOA: 03/14/2021  PCP: Ginger Organ., MD   Patient coming from: Home   Chief Complaint: Not eating or drinking much, increased confusion, slurred speech   HPI: JARONE OSTERGAARD is a 70 y.o. male with medical history significant for CAD, chronic combined systolic and diastolic CHF, diabetes mellitus, depression, anxiety, recent cholecystitis with percutaneous drain, now presenting to emergency department for evaluation of anorexia, increased confusion, and dysarthria.  Patient has been back home with his wife from a nursing facility for 1 to 2 weeks now and has not been eating much or drinking despite her encouragement, and then was noted to have increased confusion and dysarthria beginning last night.  He was complaining of some right knee pain after a minor fall but no other complaints.  He did not hit his head or suffer any other apparent injuries with the fall.  There has not been any vomiting or diarrhea.  Patient's wife manages the cholecystostomy which she drains daily and has not noticed any change in the character or volume of output.  Patient has not appeared to be short of breath.  He has continued to take his medications including Lasix daily.  ED Course: Upon arrival to the ED, patient is found to be afebrile, saturating well on room air, mildly tachypneic, tachycardic to 120s, and with blood pressure 105/69.  EKG features sinus tachycardia.  Head CT negative for acute intracranial abnormality.  Chest x-ray and cardiopulmonary disease.  Notable for potassium 3.3, bicarbonate 19, and creatinine 1.33.  CBC features a leukocytosis to 15,300.  Patient was given a liter of saline and 1 g IV Rocephin in the ED.  Review of Systems:  All other systems reviewed and apart from HPI, are negative.  Past Medical History:  Diagnosis Date  . Acute MI ([place])   . Allergy   . Anxiety   . Arthritis   . Coronary artery disease    . Depression   . Diabetes mellitus without complication ([place])   . Diverticulosis    mild  . Hemorrhoids   . HOH (hard of hearing)   . Hx of colonic polyps   . Hyperlipidemia   . Hypertension   . Left ventricular dysfunction    hx of    Past Surgical History:  Procedure Laterality Date  . ANGIOPLASTY     and bare metal stent placement  . ANKLE SURGERY Right 1986   i&d abscess- pt states "put a bone in it"  . CORONARY ANGIOPLASTY WITH STENT PLACEMENT    . IR PERC CHOLECYSTOSTOMY  02/07/2021  . KNEE ARTHROPLASTY Right 08/09/2016   Procedure: RIGHT TOTAL KNEE ARTHROPLASTY WITH COMPUTER NAVIGATION;  Surgeon: Rod Can, MD;  Location: [facility];  Service: Orthopedics;  Laterality: Right;  Needs RNFA  . LEFT HEART CATH AND CORONARY ANGIOGRAPHY N/A 11/21/2018   Procedure: LEFT HEART CATH AND CORONARY ANGIOGRAPHY;  Surgeon: Burnell Blanks, MD;  Location: [facility];  Service: Cardiovascular;  Laterality: N/A;    Social History:   reports that he quit smoking about 15 years ago. His smoking use included cigars. He quit smokeless tobacco use about 15 years ago. He reports that he does not drink alcohol and does not use drugs.  Allergies  Allergen Reactions  . Penicillins Other (See Comments)    Disputed in 2022- Has tolerated cephalexin in the past  Has patient had a PCN reaction causing immediate rash, facial/tongue/throat  swelling, SOB or lightheadedness with hypotension: Unknown Has patient had a PCN reaction causing severe rash involving mucus membranes or skin necrosis: Unknown Has patient had a PCN reaction that required hospitalization: Unknown Has patient had a PCN reaction occurring within the last 10 years: No      Family History  Problem Relation Age of Onset  . Coronary artery disease Other        positive family hx of  . Colon cancer Neg Hx   . Esophageal cancer Neg Hx   . Liver cancer Neg Hx   . Pancreatic cancer Neg Hx   . Rectal cancer Neg Hx    . Stomach cancer Neg Hx      Prior to Admission medications   Medication Sig Start Date End Date Taking? Authorizing Provider  acetaminophen (TYLENOL) 500 MG tablet Take 1,000 mg by mouth every 6 (six) hours as needed for mild pain, fever or headache.   Yes [provider]  aspirin EC 81 MG tablet Take 1 tablet (81 mg total) by mouth daily. Patient taking differently: Take 81 mg by mouth at bedtime. 11/04/17  Yes Lyda Jester M, PA-C  docusate sodium (COLACE) 100 MG capsule Take 2 capsules (200 mg total) by mouth 2 (two) times daily. Patient taking differently: Take 200 mg by mouth 2 (two) times daily as needed for mild constipation. 01/27/21  Yes Hosie Poisson, MD  furosemide (LASIX) 40 MG tablet Take 1 tablet (40 mg total) by mouth daily. 02/11/21  Yes Lavina Hamman, MD  JARDIANCE 25 MG TABS tablet Take 25 mg by mouth daily. 02/28/21  Yes [provider]  LORazepam (ATIVAN) 1 MG tablet Take 1 mg by mouth every 8 (eight) hours as needed for anxiety.   Yes [provider]  metFORMIN (GLUCOPHAGE) 1000 MG tablet Take 1,000 mg by mouth 2 (two) times daily.   Yes [provider]  pantoprazole (PROTONIX) 40 MG tablet Take 1 tablet (40 mg total) by mouth daily. 02/12/21  Yes Lavina Hamman, MD  QUEtiapine (SEROQUEL) 25 MG tablet Take 1 tablet (25 mg total) by mouth 2 (two) times daily. Patient taking differently: Take 25 mg by mouth at bedtime. 01/27/21  Yes Hosie Poisson, MD  sertraline (ZOLOFT) 25 MG tablet Take 25 mg by mouth daily. 02/28/21  Yes [provider]  simvastatin (ZOCOR) 40 MG tablet Take 1 tablet (40 mg total) by mouth at bedtime. 11/04/17  Yes Lyda Jester M, PA-C  thiamine 100 MG tablet Take 1 tablet (100 mg total) by mouth daily. 01/28/21  Yes Hosie Poisson, MD  traZODone (DESYREL) 50 MG tablet Take 50 mg by mouth at bedtime. 02/28/21  Yes [provider]  Maltodextrin-Xanthan Gum ([place]) POWD Use with  every meals. Patient taking differently: Take 4 g by mouth See admin instructions. Use with every meals. 01/27/21   Hosie Poisson, MD  Multiple Vitamin (MULTIVITAMIN WITH MINERALS) TABS tablet Take 1 tablet by mouth daily. Patient not taking: Reported on 03/14/2021 01/28/21   Hosie Poisson, MD  tamsulosin (FLOMAX) 0.4 MG CAPS capsule Take 1 capsule (0.4 mg total) by mouth daily. Patient not taking: Reported on 03/14/2021 01/28/21   Hosie Poisson, MD  vitamin B-12 (CYANOCOBALAMIN) 1000 MCG tablet Take 1 tablet (1,000 mcg total) by mouth daily. Patient not taking: Reported on 03/14/2021 01/27/21   Hosie Poisson, MD    Physical Exam: Vitals:   03/14/21 2158 03/14/21 2200 03/14/21 2230 03/14/21 2300  BP: 105/69 111/73 138/74 110/75  Pulse: (!) 118 (!) 118 (!) 117 (!) 122  Resp: 20 20 (!) 22 (!) 23  Temp:      TempSrc:      SpO2: 98% 97% 96% 98%  Weight:      Height:        Constitutional: NAD, calm  Eyes: PERTLA, lids and conjunctivae normal ENMT: Mucous membranes are moist. Posterior pharynx clear of any exudate or lesions.   Neck: supple, no masses  Respiratory:  no wheezing, no crackles. No accessory muscle use.  Cardiovascular: Rate ~120 and regular. No extremity edema.   Abdomen: No distension, no tenderness, soft, RUQ drain with dark green output. Bowel sounds active.  Musculoskeletal: no clubbing / cyanosis. No joint deformity upper and lower extremities.   Skin: abrasions to anterior right knee. Warm, dry, well-perfused. Neurologic: CN 2-12 grossly intact. Dysarthria. Sensation intact. Moving all extremities.  Psychiatric: Alert and oriented to person and place only. Calm and cooperative.    Labs and Imaging on Admission: I have personally reviewed following labs and imaging studies  CBC: Recent Labs  Lab 03/14/21 1718  WBC 15.3*  NEUTROABS 12.2*  HGB 13.9  HCT 42.4  MCV 88.0  PLT 702   Basic Metabolic Panel: Recent Labs  Lab 03/14/21 1718  NA 134*  K 3.3*  CL 95*   CO2 19*  GLUCOSE 138*  BUN 33*  CREATININE 1.33*  CALCIUM 9.6   GFR: Estimated Creatinine Clearance: 57.5 mL/min (A) (by C-G formula based on SCr of 1.33 mg/dL (H)). Liver Function Tests: Recent Labs  Lab 03/14/21 1718  AST 16  ALT 9  ALKPHOS 141*  BILITOT 1.1  PROT 7.7  ALBUMIN 3.3*   Recent Labs  Lab 03/14/21 1718  LIPASE 23   Recent Labs  Lab 03/14/21 1718  AMMONIA 21   Coagulation Profile: No results for input(s): INR, PROTIME in the last 168 hours. Cardiac Enzymes: No results for input(s): CKTOTAL, CKMB, CKMBINDEX, TROPONINI in the last 168 hours. BNP (last 3 results) No results for input(s): PROBNP in the last 8760 hours. HbA1C: No results for input(s): HGBA1C in the last 72 hours. CBG: No results for input(s): GLUCAP in the last 168 hours. Lipid Profile: No results for input(s): CHOL, HDL, LDLCALC, TRIG, CHOLHDL, LDLDIRECT in the last 72 hours. Thyroid Function Tests: No results for input(s): TSH, T4TOTAL, FREET4, T3FREE, THYROIDAB in the last 72 hours. Anemia Panel: No results for input(s): VITAMINB12, FOLATE, FERRITIN, TIBC, IRON, RETICCTPCT in the last 72 hours. Urine analysis:    Component Value Date/Time   COLORURINE YELLOW 03/14/2021 1718   APPEARANCEUR CLOUDY (A) 03/14/2021 1718   LABSPEC 1.008 03/14/2021 1718   PHURINE 5.0 03/14/2021 1718   GLUCOSEU 150 (A) 03/14/2021 1718   HGBUR MODERATE (A) 03/14/2021 1718   BILIRUBINUR NEGATIVE 03/14/2021 1718   KETONESUR 5 (A) 03/14/2021 1718   PROTEINUR 30 (A) 03/14/2021 1718   UROBILINOGEN 0.2 01/06/2014 1257   NITRITE NEGATIVE 03/14/2021 1718   LEUKOCYTESUR LARGE (A) 03/14/2021 1718   Sepsis Labs: @LABRCNTIP (procalcitonin:4,lacticidven:4) ) Recent Results (from the past 240 hour(s))  Resp Panel by RT-PCR (Flu A&B, Covid) Nasopharyngeal Swab     Status: None   Collection Time: 03/14/21  5:19 PM   Specimen: Nasopharyngeal Swab; Nasopharyngeal(NP) swabs in vial transport medium  Result Value  Ref Range Status   SARS Coronavirus 2 by RT PCR NEGATIVE NEGATIVE Final    Comment: (NOTE) SARS-CoV-2 target nucleic acids are NOT DETECTED.  The SARS-CoV-2 RNA is generally detectable in  upper respiratory specimens during the acute phase of infection. The lowest concentration of SARS-CoV-2 viral copies this assay can detect is 138 copies/mL. A negative result does not preclude SARS-Cov-2 infection and should not be used as the sole basis for treatment or other patient management decisions. A negative result may occur with  improper specimen collection/handling, submission of specimen other than nasopharyngeal swab, presence of viral mutation(s) within the areas targeted by this assay, and inadequate number of viral copies(<138 copies/mL). A negative result must be combined with clinical observations, patient history, and epidemiological information. The expected result is Negative.  Fact Sheet for Patients:  EntrepreneurPulse.com.au  Fact Sheet for Healthcare Providers:  IncredibleEmployment.be  This test is no t yet approved or cleared by the [place] FDA and  has been authorized for detection and/or diagnosis of SARS-CoV-2 by FDA under an Emergency Use Authorization (EUA). This EUA will remain  in effect (meaning this test can be used) for the duration of the COVID-19 declaration under Section 564(b)(1) of the Act, 21 U.S.C.section 360bbb-3(b)(1), unless the authorization is terminated  or revoked sooner.       Influenza A by PCR NEGATIVE NEGATIVE Final   Influenza B by PCR NEGATIVE NEGATIVE Final    Comment: (NOTE) The Xpert Xpress SARS-CoV-2/FLU/RSV plus assay is intended as an aid in the diagnosis of influenza from Nasopharyngeal swab specimens and should not be used as a sole basis for treatment. Nasal washings and aspirates are unacceptable for Xpert Xpress SARS-CoV-2/FLU/RSV testing.  Fact Sheet for  Patients: EntrepreneurPulse.com.au  Fact Sheet for Healthcare Providers: IncredibleEmployment.be  This test is not yet approved or cleared by the [place] FDA and has been authorized for detection and/or diagnosis of SARS-CoV-2 by FDA under an Emergency Use Authorization (EUA). This EUA will remain in effect (meaning this test can be used) for the duration of the COVID-19 declaration under Section 564(b)(1) of the Act, 21 U.S.C. section 360bbb-3(b)(1), unless the authorization is terminated or revoked.  Performed at [facility], [place] [address] [place], [place] [address]      Radiological Exams on Admission: DG Chest 2 View  Result Date: 03/14/2021 CLINICAL DATA:  Generalized weakness EXAM: CHEST - 2 VIEW COMPARISON:  02/06/2021 chest radiograph. FINDINGS: Stable cardiomediastinal silhouette with normal heart size. No pneumothorax. No pleural effusion. Lungs appear clear, with no acute consolidative airspace disease and no pulmonary edema. IMPRESSION: No active cardiopulmonary disease. Electronically Signed   By: Ilona Sorrel M.D.   On: 03/14/2021 19:20   CT Head Wo Contrast  Result Date: 03/14/2021 CLINICAL DATA:  Delirium. Generalized weakness. Progressive confusion and slurring of speech. EXAM: CT HEAD WITHOUT CONTRAST TECHNIQUE: Contiguous axial images were obtained from the base of the skull through the vertex without intravenous contrast. COMPARISON:  CT 01/12/2021 FINDINGS: Brain: No evidence of acute infarction, hemorrhage, hydrocephalus, extra-axial collection or mass lesion/mass effect. There is moderate patchy low-attenuation within the subcortical and periventricular white matter compatible with chronic microvascular disease. Remote left cerebellar infarct appears unchanged. Vascular: No hyperdense vessel or unexpected calcification. Skull: Normal. Negative for fracture or focal lesion. Sinuses/Orbits: No acute finding.  Other: None IMPRESSION: 1. No acute intracranial abnormalities. 2. Chronic small vessel ischemic disease and brain atrophy. Electronically Signed   By: Kerby Moors M.D.   On: 03/14/2021 19:03   DG Knee Complete 4 Views Left  Result Date: 03/14/2021 CLINICAL DATA:  Left knee pain. EXAM: LEFT KNEE - COMPLETE 4+ VIEW COMPARISON:  None. FINDINGS: No evidence of  fracture, dislocation, or joint effusion. Medial tibiofemoral joint space narrowing. Moderate tricompartmental peripheral spurring. No erosion or bony destruction. There are vascular calcifications. IMPRESSION: Moderate tricompartmental osteoarthritis, most prominent in the medial tibiofemoral compartment. Electronically Signed   By: Keith Rake M.D.   On: 03/14/2021 20:58    EKG: Independently reviewed. Sinus tachycardia, rate 124.   Assessment/Plan   1. Sepsis secondary to UTI  - Presents with poor oral intake for at least a week and one day of increased confusion and dysarthria and is found to have tachycardia, leukocytosis, AKI, and likely UTI  - Culture blood and urine, check lactate, continue empiric Rocephin    2. AKI  - SCr is 1.31 on admission, up from 0.46 one month ago  - Likely acute prerenal azotemia in setting of decreased oral intake; ATN possible  - Hold Lasix, continue IVF hydration, renally-dose medications, repeat chem panel in am   3. Acute encephalopathy  - Patient more confused and dysarthric since last night per report of his wife  - No acute findings noted on head CT and ammonia is normal in ED  - Likely related to UTI and AKI  - Continue IVF hydration and antibiotics, reduce frequency of Seroquel and Ativan for now   4. Cholecystitis  - Percutaneous drain placed by IR on 02/08/21 and 14 days of antibiotics were completed  - Surgery recommended keeping drain in at least 6 weeks before possible cholecystectomy  - Continue drain care    5. Chronic combined systolic & diastolic CHF  - EF was 82-50% with  grade 1 diastolic dysfunction in April 2022  - Appears hypovolemic on admission  - Continue gentle IVF hydration, hold Lasix   6. CAD  - No anginal complaints, continue ASA and statin    7. Diabetes mellitus  - A1c was 7.2% in April 2022    8. Depression, anxiety  - Continue Zoloft and trazodone - Decrease Seroquel to qHS only and decrease frequency of as-needed Ativan for now in light of increased confusion    DVT prophylaxis: sq heparin  Code Status: DNR, confirmed with wife  Level of Care: Level of care: Telemetry Family Communication: Wife updated by phone  Disposition Plan:  Patient is from: Home  Anticipated d/c is to: TBD Anticipated d/c date is: 03/17/21 Patient currently: Pending cultures, improvement in renal function, PT eval  Consults called: None  Admission status:  Inpatient    Vianne Bulls, MD [facility]  03/14/2021, 11:27 PM

## 2021-03-14 NOTE — ED Triage Notes (Signed)
BIBA from home with c/o generalized weakness. Per EMS pt was initially tachycardic. Wife reports pt more confused and slurring speech more than normal today. Last seen normal was yesterday. Wife reports stroke last month.   110/76 HR 130 RR-22 95% room air CBG 179

## 2021-03-14 NOTE — ED Provider Notes (Signed)
[facility] DEPT Provider Note   CSN: [id number] Arrival date & time: 03/14/21  1453     History Chief Complaint  Patient presents with  . Weakness    Vincent [name] is a 70 y.o. male.  HPI   Patient is being evaluated and treated by palliative care, he is DNR, goals are to maximize quality of life and symptom control.  Recently diagnosed with severe sepsis, related to chronic cholecystitis.  He had therapeutic cholecystectomy.  A drain was placed.  He was discharged to a skilled nursing facility on 02/11/2021.  At discharge she had a coccygeal stage III pressure sore.  He is unable to give history.  Level 5 caveat-altered mental status    Past Medical History:  Diagnosis Date  . Acute MI ([place])   . Allergy   . Anxiety   . Arthritis   . Coronary artery disease   . Depression   . Diabetes mellitus without complication ([place])   . Diverticulosis    mild  . Hemorrhoids   . HOH (hard of hearing)   . Hx of colonic polyps   . Hyperlipidemia   . Hypertension   . Left ventricular dysfunction    hx of    Patient Active Problem List   Diagnosis Date Noted  . UTI (urinary tract infection) 03/14/2021  . Severe sepsis with septic shock ([place]) 02/06/2021  . Chronic cholecystitis 02/06/2021  . Sacral decubitus ulcer 02/06/2021  . Severe sepsis with septic shock (CODE) ([place]) 02/06/2021  . Vitamin B12 deficiency 01/27/2021  . Diabetes 1.5, managed as type 2 ([place]) 01/27/2021  . Chronic systolic CHF (congestive heart failure) ([place]) 01/27/2021  . CVA (cerebral vascular accident) ([place]) 01/12/2021  . Weakness   . Precordial pain   . Right knee pain 01/07/2018  . History of total right knee replacement 01/07/2018  . SOB (shortness of breath) 08/12/2016  . Tachycardia 08/12/2016  . Low grade fever 08/12/2016  . Osteoarthritis of right knee 08/09/2016  . OSA (obstructive sleep apnea) 11/29/2011  . Syncope 07/24/2011  . ARM NUMBNESS 06/19/2010  . EDEMA  03/13/2010  . FASTING HYPERGLYCEMIA 03/13/2010  . CORONARY ATHEROSCLEROSIS NATIVE CORONARY ARTERY 06/20/2009  . COLONIC POLYPS, ADENOMATOUS 01/31/2009  . HLD (hyperlipidemia) 01/31/2009  . Hypertension 01/31/2009  . Coronary atherosclerosis 01/31/2009  . HEMORRHOIDS 01/31/2009  . DIVERTICULOSIS, MILD 01/31/2009    Past Surgical History:  Procedure Laterality Date  . ANGIOPLASTY     and bare metal stent placement  . ANKLE SURGERY Right 1986   i&d abscess- pt states "put a bone in it"  . CORONARY ANGIOPLASTY WITH STENT PLACEMENT    . IR PERC CHOLECYSTOSTOMY  02/07/2021  . KNEE ARTHROPLASTY Right 08/09/2016   Procedure: RIGHT TOTAL KNEE ARTHROPLASTY WITH COMPUTER NAVIGATION;  Surgeon: Rod Can, MD;  Location: [facility];  Service: Orthopedics;  Laterality: Right;  Needs RNFA  . LEFT HEART CATH AND CORONARY ANGIOGRAPHY N/A 11/21/2018   Procedure: LEFT HEART CATH AND CORONARY ANGIOGRAPHY;  Surgeon: Burnell Blanks, MD;  Location: [facility];  Service: Cardiovascular;  Laterality: N/A;       Family History  Problem Relation Age of Onset  . Coronary artery disease Other        positive family hx of  . Colon cancer Neg Hx   . Esophageal cancer Neg Hx   . Liver cancer Neg Hx   . Pancreatic cancer Neg Hx   . Rectal cancer Neg Hx   .  Stomach cancer Neg Hx     Social History   Tobacco Use  . Smoking status: Former Smoker    Types: Cigars    Quit date: 12/17/2005    Years since quitting: 15.2  . Smokeless tobacco: Former Systems developer    Quit date: 12/17/2005  Vaping Use  . Vaping Use: Never used  Substance Use Topics  . Alcohol use: No  . Drug use: No    Home Medications Prior to Admission medications   Medication Sig Start Date End Date Taking? Authorizing Provider  acetaminophen (TYLENOL) 500 MG tablet Take 1,000 mg by mouth every 6 (six) hours as needed for mild pain, fever or headache.   Yes [provider]  aspirin EC 81 MG tablet Take 1 tablet (81 mg  total) by mouth daily. Patient taking differently: Take 81 mg by mouth at bedtime. 11/04/17  Yes Lyda Jester M, PA-C  docusate sodium (COLACE) 100 MG capsule Take 2 capsules (200 mg total) by mouth 2 (two) times daily. Patient taking differently: Take 200 mg by mouth 2 (two) times daily as needed for mild constipation. 01/27/21  Yes Hosie Poisson, MD  furosemide (LASIX) 40 MG tablet Take 1 tablet (40 mg total) by mouth daily. 02/11/21  Yes Lavina Hamman, MD  JARDIANCE 25 MG TABS tablet Take 25 mg by mouth daily. 02/28/21  Yes [provider]  LORazepam (ATIVAN) 1 MG tablet Take 1 mg by mouth every 8 (eight) hours as needed for anxiety.   Yes [provider]  metFORMIN (GLUCOPHAGE) 1000 MG tablet Take 1,000 mg by mouth 2 (two) times daily.   Yes [provider]  pantoprazole (PROTONIX) 40 MG tablet Take 1 tablet (40 mg total) by mouth daily. 02/12/21  Yes Lavina Hamman, MD  QUEtiapine (SEROQUEL) 25 MG tablet Take 1 tablet (25 mg total) by mouth 2 (two) times daily. Patient taking differently: Take 25 mg by mouth at bedtime. 01/27/21  Yes Hosie Poisson, MD  sertraline (ZOLOFT) 25 MG tablet Take 25 mg by mouth daily. 02/28/21  Yes [provider]  simvastatin (ZOCOR) 40 MG tablet Take 1 tablet (40 mg total) by mouth at bedtime. 11/04/17  Yes Lyda Jester M, PA-C  thiamine 100 MG tablet Take 1 tablet (100 mg total) by mouth daily. 01/28/21  Yes Hosie Poisson, MD  traZODone (DESYREL) 50 MG tablet Take 50 mg by mouth at bedtime. 02/28/21  Yes [provider]  Maltodextrin-Xanthan Gum ([place]) POWD Use with every meals. Patient taking differently: Take 4 g by mouth See admin instructions. Use with every meals. 01/27/21   Hosie Poisson, MD  Multiple Vitamin (MULTIVITAMIN WITH MINERALS) TABS tablet Take 1 tablet by mouth daily. Patient not taking: Reported on 03/14/2021 01/28/21   Hosie Poisson, MD  tamsulosin (FLOMAX) 0.4 MG CAPS capsule Take  1 capsule (0.4 mg total) by mouth daily. Patient not taking: Reported on 03/14/2021 01/28/21   Hosie Poisson, MD  vitamin B-12 (CYANOCOBALAMIN) 1000 MCG tablet Take 1 tablet (1,000 mcg total) by mouth daily. Patient not taking: Reported on 03/14/2021 01/27/21   Hosie Poisson, MD    Allergies    Penicillins  Review of Systems   Review of Systems  Unable to perform ROS: Mental status change    Physical Exam Updated Vital Signs BP 105/69 (BP Location: Left Arm)   Pulse (!) 118   Temp 97.7 F (36.5 C) (Oral)   Resp 20   Ht 5\' 8"  (1.727 m)   Wt  91.3 kg   SpO2 98%   BMI 30.60 kg/m   Physical Exam Vitals and nursing note reviewed.  Constitutional:      General: He is not in acute distress.    Appearance: He is well-developed. He is not ill-appearing, toxic-appearing or diaphoretic.  HENT:     Head: Normocephalic and atraumatic.     Right Ear: External ear normal.     Left Ear: External ear normal.     Mouth/Throat:     Mouth: Mucous membranes are dry.     Pharynx: No oropharyngeal exudate or posterior oropharyngeal erythema.  Eyes:     Conjunctiva/sclera: Conjunctivae normal.     Pupils: Pupils are equal, round, and reactive to light.  Neck:     Trachea: Phonation normal.  Cardiovascular:     Rate and Rhythm: Normal rate and regular rhythm.     Heart sounds: Normal heart sounds.  Pulmonary:     Effort: Pulmonary effort is normal.     Breath sounds: Normal breath sounds.  Abdominal:     General: There is no distension.     Palpations: Abdomen is soft.     Tenderness: There is no abdominal tenderness.  Musculoskeletal:        General: Normal range of motion.     Cervical back: Normal range of motion and neck supple.  Skin:    General: Skin is warm and dry.  Neurological:     Mental Status: He is alert.     Cranial Nerves: No cranial nerve deficit.     Sensory: No sensory deficit.     Motor: No abnormal muscle tone.     Coordination: Coordination normal.  Psychiatric:         Mood and Affect: Mood normal.        Behavior: Behavior normal.     ED Results / Procedures / Treatments   Labs (all labs ordered are listed, but only abnormal results are displayed) Labs Reviewed  COMPREHENSIVE METABOLIC PANEL - Abnormal; Notable for the following components:      Result Value   Sodium 134 (*)    Potassium 3.3 (*)    Chloride 95 (*)    CO2 19 (*)    Glucose, Bld 138 (*)    BUN 33 (*)    Creatinine, Ser 1.33 (*)    Albumin 3.3 (*)    Alkaline Phosphatase 141 (*)    GFR, Estimated 58 (*)    Anion gap 20 (*)    All other components within normal limits  CBC WITH DIFFERENTIAL/PLATELET - Abnormal; Notable for the following components:   WBC 15.3 (*)    RDW 16.4 (*)    Neutro Abs 12.2 (*)    Monocytes Absolute 1.2 (*)    Abs Immature Granulocytes 0.10 (*)    All other components within normal limits  BLOOD GAS, VENOUS - Abnormal; Notable for the following components:   pCO2, Ven 35.9 (*)    pO2, Ven 56.8 (*)    Bicarbonate 19.6 (*)    Acid-base deficit 4.8 (*)    All other components within normal limits  URINALYSIS, ROUTINE W REFLEX MICROSCOPIC - Abnormal; Notable for the following components:   APPearance CLOUDY (*)    Glucose, UA 150 (*)    Hgb urine dipstick MODERATE (*)    Ketones, ur 5 (*)    Protein, ur 30 (*)    Leukocytes,Ua LARGE (*)    WBC, UA >50 (*)    Bacteria, UA  MANY (*)    All other components within normal limits  RAPID URINE DRUG SCREEN, HOSP PERFORMED - Abnormal; Notable for the following components:   Benzodiazepines POSITIVE (*)    All other components within normal limits  RESP PANEL BY RT-PCR (FLU A&B, COVID) ARPGX2  URINE CULTURE  LIPASE, BLOOD  AMMONIA  ETHANOL    EKG None  Radiology DG Chest 2 View  Result Date: 03/14/2021 CLINICAL DATA:  Generalized weakness EXAM: CHEST - 2 VIEW COMPARISON:  02/06/2021 chest radiograph. FINDINGS: Stable cardiomediastinal silhouette with normal heart size. No pneumothorax. No  pleural effusion. Lungs appear clear, with no acute consolidative airspace disease and no pulmonary edema. IMPRESSION: No active cardiopulmonary disease. Electronically Signed   By: Ilona Sorrel M.D.   On: 03/14/2021 19:20   CT Head Wo Contrast  Result Date: 03/14/2021 CLINICAL DATA:  Delirium. Generalized weakness. Progressive confusion and slurring of speech. EXAM: CT HEAD WITHOUT CONTRAST TECHNIQUE: Contiguous axial images were obtained from the base of the skull through the vertex without intravenous contrast. COMPARISON:  CT 01/12/2021 FINDINGS: Brain: No evidence of acute infarction, hemorrhage, hydrocephalus, extra-axial collection or mass lesion/mass effect. There is moderate patchy low-attenuation within the subcortical and periventricular white matter compatible with chronic microvascular disease. Remote left cerebellar infarct appears unchanged. Vascular: No hyperdense vessel or unexpected calcification. Skull: Normal. Negative for fracture or focal lesion. Sinuses/Orbits: No acute finding. Other: None IMPRESSION: 1. No acute intracranial abnormalities. 2. Chronic small vessel ischemic disease and brain atrophy. Electronically Signed   By: Kerby Moors M.D.   On: 03/14/2021 19:03   DG Knee Complete 4 Views Left  Result Date: 03/14/2021 CLINICAL DATA:  Left knee pain. EXAM: LEFT KNEE - COMPLETE 4+ VIEW COMPARISON:  None. FINDINGS: No evidence of fracture, dislocation, or joint effusion. Medial tibiofemoral joint space narrowing. Moderate tricompartmental peripheral spurring. No erosion or bony destruction. There are vascular calcifications. IMPRESSION: Moderate tricompartmental osteoarthritis, most prominent in the medial tibiofemoral compartment. Electronically Signed   By: Keith Rake M.D.   On: 03/14/2021 20:58    Procedures .Critical Care Performed by: Daleen Bo, MD Authorized by: Daleen Bo, MD   Critical care provider statement:    Critical care time (minutes):  45    Critical care start time:  03/14/2021 5:05 PM   Critical care end time:  03/14/2021 10:43 PM   Critical care time was exclusive of:  Separately billable procedures and treating other patients   Critical care was time spent personally by me on the following activities:  Blood draw for specimens, development of treatment plan with patient or surrogate, discussions with consultants, evaluation of patient's response to treatment, examination of patient, obtaining history from patient or surrogate, ordering and performing treatments and interventions, ordering and review of laboratory studies, pulse oximetry, re-evaluation of patient's condition, review of old charts and ordering and review of radiographic studies     Medications Ordered in ED Medications  0.9 %  sodium chloride infusion ( Intravenous New Bag/Given 03/14/21 1819)  cefTRIAXone (ROCEPHIN) 1 g in sodium chloride 0.9 % 100 mL IVPB (has no administration in time range)  sodium chloride 0.9 % bolus 500 mL (0 mLs Intravenous Stopped 03/14/21 1843)    ED Course  I have reviewed the triage vital signs and the nursing notes.  Pertinent labs & imaging results that were available during my care of the patient were reviewed by me and considered in my medical decision making (see chart for details).  Clinical Course as of  03/14/21 2244  Tue Mar 14, 2021  1715 Per wife, not eating well. Not urinating much. Sleepy. No history liver trouble. [EW]    Clinical Course User Index [EW] Daleen Bo, MD   MDM Rules/Calculators/A&P                           Patient Vitals for the past 24 hrs:  BP Temp Temp src Pulse Resp SpO2 Height Weight  03/14/21 2158 105/69 -- -- (!) 118 20 98 % -- --  03/14/21 2015 115/66 -- -- (!) 118 -- 97 % -- --  03/14/21 2000 114/77 -- -- (!) 117 (!) 22 98 % -- --  03/14/21 1930 113/78 -- -- (!) 122 (!) 21 100 % -- --  03/14/21 1900 122/70 -- -- (!) 120 (!) 24 100 % -- --  03/14/21 1845 112/69 -- -- (!) 124 (!) 28 100 %  -- --  03/14/21 1820 120/75 -- -- (!) 122 12 100 % -- --  03/14/21 1815 -- -- -- (!) 124 20 100 % -- --  03/14/21 1800 -- -- -- (!) 126 (!) 21 100 % -- --  03/14/21 1745 116/78 -- -- (!) 126 (!) 21 100 % -- --  03/14/21 1730 113/79 -- -- (!) 127 14 99 % -- --  03/14/21 1715 121/90 -- -- (!) 128 18 100 % -- --  03/14/21 1700 91/77 -- -- (!) 128 (!) 21 99 % -- --  03/14/21 1645 -- -- -- (!) 126 (!) 25 100 % -- --  03/14/21 1630 -- -- -- (!) 124 20 98 % -- --  03/14/21 1615 91/77 -- -- (!) 125 16 99 % -- --  03/14/21 1600 110/80 -- -- (!) 128 (!) 21 100 % -- --  03/14/21 1545 111/78 -- -- (!) 127 20 99 % -- --  03/14/21 1530 117/76 -- -- (!) 129 17 97 % -- --  03/14/21 1519 122/80 97.7 F (36.5 C) Oral (!) 131 17 91 % -- --  03/14/21 1511 -- -- -- -- -- -- 5\' 8"  (1.727 m) 91.3 kg  03/14/21 1504 -- -- -- -- -- 95 % -- --    10:16 PM Reevaluation with update and discussion. After initial assessment and treatment, an updated evaluation reveals patient's clinical status is essentially unchanged.  I discussed things again with his wife Barnetta Chapel.  She states that he has been home from rehab for about 2 weeks.  She states that he had a UTI, around the time he had this problem with his gallbladder, last month.  Findings discussed with Barnetta Chapel and all questions answered. Daleen Bo   Medical Decision Making:  This patient is presenting for evaluation of change of behavior characterized by sleepiness, decreased oral intake and general weakness, which does require a range of treatment options, and is a complaint that involves a high risk of morbidity and mortality. The differential diagnoses include acute illness, chronic illness, dehydration, occult infection. I decided to review old records, and in summary patient with nonspecific malaise at home, over the last week, presenting with nonspecific symptoms.  I obtained additional historical information from his wife Barnetta Chapel by  telephone.  Clinical Laboratory Tests Ordered, included CBC, Metabolic panel, Urinalysis and Venous blood gas, ammonia level, alcohol level, viral panel. Review indicates normal except sodium low, potassium low, chloride low, CO2 low, glucose high, BUN high, creatinine high, alkaline phosphatase high, PCO2 low, PO2 elevated on venous  gas, bicarb low, acid-base high, benzodiazepine urine urinalysis abnormal consistent with infection, urine culture ordered. Radiologic Tests Ordered, included right knee x-ray, chest x-ray, CT head.  I independently Visualized: Radiographic images, which show no acute abnormalities    Critical Interventions-clinical evaluation, laboratory testing, radiography, IV fluids,Observation, IV Rocephin for UTI.  After These Interventions, the Patient was reevaluated and was found improved but still unstable with tachycardia. Blood pressure has trended better over the last several hours.  Patient requires hospitalization for further care and treatment.  Doubt complications from indwelling right upper quadrant drain.  Patient has known chronic cholecystitis.  Gallbladder has not yet been removed.  Patient appears to have a UTI.  Fluid from drain appears to be consistent with bile, does not appear purulent.  Doubt severe sepsis.   CRITICAL CARE-yes Performed by: Daleen Bo  Nursing Notes Reviewed/ Care Coordinated Applicable Imaging Reviewed Interpretation of Laboratory Data incorporated into ED treatment  10:22 PM-Consult complete with hospitalist. Patient case explained and discussed.  He agrees to admit patient for further evaluation and treatment. Call ended at 10:40 PM    Final Clinical Impression(s) / ED Diagnoses Final diagnoses:  Urinary tract infection without hematuria, site unspecified  Dehydration  Arthritis of right knee  Chronic cholecystitis    Rx / DC Orders ED Discharge Orders    None       Daleen Bo, MD 03/14/21 2244

## 2021-03-15 DIAGNOSIS — E876 Hypokalemia: Secondary | ICD-10-CM

## 2021-03-15 DIAGNOSIS — E119 Type 2 diabetes mellitus without complications: Secondary | ICD-10-CM

## 2021-03-15 DIAGNOSIS — N390 Urinary tract infection, site not specified: Secondary | ICD-10-CM

## 2021-03-15 DIAGNOSIS — R627 Adult failure to thrive: Secondary | ICD-10-CM

## 2021-03-15 DIAGNOSIS — E871 Hypo-osmolality and hyponatremia: Secondary | ICD-10-CM

## 2021-03-15 DIAGNOSIS — G9341 Metabolic encephalopathy: Secondary | ICD-10-CM

## 2021-03-15 LAB — COMPREHENSIVE METABOLIC PANEL
ALT: 9 U/L (ref 0–44)
AST: 12 U/L — ABNORMAL LOW (ref 15–41)
Albumin: 3 g/dL — ABNORMAL LOW (ref 3.5–5.0)
Alkaline Phosphatase: 119 U/L (ref 38–126)
Anion gap: 14 (ref 5–15)
BUN: 29 mg/dL — ABNORMAL HIGH (ref 8–23)
CO2: 19 mmol/L — ABNORMAL LOW (ref 22–32)
Calcium: 8.8 mg/dL — ABNORMAL LOW (ref 8.9–10.3)
Chloride: 97 mmol/L — ABNORMAL LOW (ref 98–111)
Creatinine, Ser: 1.24 mg/dL (ref 0.61–1.24)
GFR, Estimated: >60 mL/min (ref >60)
Glucose, Bld: 143 mg/dL — ABNORMAL HIGH (ref 70–99)
Potassium: 2.7 mmol/L — CL (ref 3.5–5.1)
Sodium: 130 mmol/L — ABNORMAL LOW (ref 135–145)
Total Bilirubin: 0.8 mg/dL (ref 0.3–1.2)
Total Protein: 6.7 g/dL (ref 6.5–8.1)

## 2021-03-15 LAB — BLOOD CULTURE ID PANEL (REFLEXED) - BCID2

## 2021-03-15 LAB — CBG MONITORING, ED
Glucose-Capillary: 149 mg/dL — ABNORMAL HIGH (ref 70–99)
Glucose-Capillary: 146 mg/dL — ABNORMAL HIGH (ref 70–99)
Glucose-Capillary: 122 mg/dL — ABNORMAL HIGH (ref 70–99)

## 2021-03-15 LAB — PROTIME-INR
INR: 1.3 — ABNORMAL HIGH (ref 0.8–1.2)
Prothrombin Time: 16 seconds — ABNORMAL HIGH (ref 11.4–15.2)

## 2021-03-15 LAB — CBC
HCT: 34.3 % — ABNORMAL LOW (ref 39.0–52.0)
Hemoglobin: 11.6 g/dL — ABNORMAL LOW (ref 13.0–17.0)
MCH: 28.5 pg (ref 26.0–34.0)
MCHC: 33.8 g/dL (ref 30.0–36.0)
MCV: 84.3 fL (ref 80.0–100.0)
Platelets: 291 10*3/uL (ref 150–400)
RBC: 4.07 MIL/uL — ABNORMAL LOW (ref 4.22–5.81)
RDW: 15.9 % — ABNORMAL HIGH (ref 11.5–15.5)
WBC: 14 10*3/uL — ABNORMAL HIGH (ref 4.0–10.5)
nRBC: 0 % (ref 0.0–0.2)

## 2021-03-15 LAB — GLUCOSE, CAPILLARY
Glucose-Capillary: 166 mg/dL — ABNORMAL HIGH (ref 70–99)
Glucose-Capillary: 177 mg/dL — ABNORMAL HIGH (ref 70–99)

## 2021-03-15 LAB — LACTIC ACID, PLASMA
Lactic Acid, Venous: 1.1 mmol/L (ref 0.5–1.9)
Lactic Acid, Venous: 0.7 mmol/L (ref 0.5–1.9)
Lactic Acid, Venous: 1 mmol/L (ref 0.5–1.9)

## 2021-03-15 LAB — MAGNESIUM: Magnesium: 1.7 mg/dL (ref 1.7–2.4)

## 2021-03-15 LAB — APTT: aPTT: 33 seconds (ref 24–36)

## 2021-03-15 MED ORDER — POTASSIUM CHLORIDE 10 MEQ/100ML IV SOLN
10.0000 meq | Freq: Once | INTRAVENOUS | Status: AC
  Administered 2021-03-15: 10 meq via INTRAVENOUS
  Filled 2021-03-15: qty 100

## 2021-03-15 MED ORDER — SODIUM CHLORIDE 0.9 % IV SOLN: INTRAVENOUS | Status: DC

## 2021-03-15 MED ORDER — MAGNESIUM SULFATE 2 GM/50ML IV SOLN
2.0000 g | Freq: Once | INTRAVENOUS | Status: AC
  Administered 2021-03-15: 2 g via INTRAVENOUS
  Filled 2021-03-15: qty 50

## 2021-03-15 MED ORDER — VANCOMYCIN HCL 1500 MG/300ML IV SOLN: 1500.0000 mg | INTRAVENOUS | Status: DC

## 2021-03-15 MED ORDER — POTASSIUM CHLORIDE 10 MEQ/100ML IV SOLN
10.0000 meq | INTRAVENOUS | Status: AC
  Administered 2021-03-15 (×4): 10 meq via INTRAVENOUS
  Filled 2021-03-15 (×4): qty 100

## 2021-03-15 MED ORDER — VANCOMYCIN HCL 1750 MG/350ML IV SOLN
1750.0000 mg | Freq: Once | INTRAVENOUS | Status: AC
  Administered 2021-03-15: 1750 mg via INTRAVENOUS
  Filled 2021-03-15: qty 350

## 2021-03-15 NOTE — Progress Notes (Signed)
Pharmacy Antibiotic Note  Vincent [name] is a 70 y.o. male admitted on 03/14/2021 with sepsis.  Pharmacy has been consulted for vancomycin dosing.  Plan: Vancomycin 1750mg  IV x 1, then 1500mg  IV q24h Estimated AUC 462 using SCr 1.24 Follow up renal function & cultures and ability to d/c abx  Height: 5\' 8"  (172.7 cm) Weight: 91.3 kg (201 lb 4.5 oz) IBW/kg (Calculated) : 68.4  Temp (24hrs), Avg:98.1 F (36.7 C), Min:98 F (36.7 C), Max:98.1 F (36.7 C)  Recent Labs  Lab 03/14/21 1718 03/15/21 0240 03/15/21 0255 03/15/21 0928 03/15/21 1444  WBC 15.3*  --  14.0*  --   --   CREATININE 1.33*  --  1.24  --   --   LATICACIDVEN  --  1.1  --  0.7 1.0    Estimated Creatinine Clearance: 61.7 mL/min (by C-G formula based on SCr of 1.24 mg/dL).    Allergies  Allergen Reactions  . Penicillins Other (See Comments)    Disputed in 2022- Has tolerated cephalexin in the past  Has patient had a PCN reaction causing immediate rash, facial/tongue/throat swelling, SOB or lightheadedness with hypotension: Unknown Has patient had a PCN reaction causing severe rash involving mucus membranes or skin necrosis: Unknown Has patient had a PCN reaction that required hospitalization: Unknown Has patient had a PCN reaction occurring within the last 10 years: No      Antimicrobials this admission:  6/8 CTX >> 6/8 Vanc >>  Dose adjustments this admission:   Microbiology results:  6/7 UCx: 6/8 BCx: both bottles of one set staph species  Thank you for allowing pharmacy to be a part of this patient's care.  Peggyann Juba, PharmD, BCPS Pharmacy: [phone] 03/15/2021 9:43 PM

## 2021-03-15 NOTE — Progress Notes (Signed)
PROGRESS NOTE    Vincent [name]  ZOX:[id number] DOB: 30-Nov-1950 DOA: 03/14/2021 PCP: Ginger Organ., MD    Chief Complaint  Patient presents with  . Weakness    Brief Narrative:  H/o non-insulin-dependent DMII, history of CVA ,CAD s/p pci to lad, combined CHF, recent history of acute on chronic cholecystitis underwent PERC cholecystostomy drain placement by IR on 5/3, finished 14 days of antibiotic treatment, presenting to emergency department for evaluation of anorexia, increased confusion, and dysarthria. Found to have sepsis and possible UTI  Subjective:   Aaox3, some dysarthria He is not a good historian, reports having dysuria because he eats too much, currently denies abdominal pain, he cannot remember when his last time he moved his bowel  Assessment & Plan:   Principal Problem:   UTI (urinary tract infection) Active Problems:   CVA (cerebral vascular accident) ([place])   Diabetes 1.5, managed as type 2 ([employer])   Chronic systolic CHF (congestive heart failure) ([place])   Chronic cholecystitis   Severe sepsis ([place])   AKI (acute kidney injury) ([place])   Hypokalemia   Depression   Anxiety   Sepsis likely secondary to UTI, present on admission -Patient presented with tachycardia, tachypnea, leukocytosis, AKI, acute metabolic encephalopathy -UA with many bacteria, large leukocyte ,urine culture and blood culture in process, chest x-ray no acute findings -Patient does has risk factor of UTI with history of BPH also on Jardiance -Continue Rocephin, hold Jardiance  Acute metabolic encephalopathy -CT head no acute findings, ammonia level unremarkable -likely due to sepsis, monitor  AKI Creatinine baseline 0.46 BUN 33/creatinine 1.33 on presentation Hold Lasix, hold metformin, continue hydration, repeat BMP in the morning, renal dosing medication  Hyponatremia Likely due to dehydration due to poor oral intake Continue hydration  Hypokalemia/hypomagnesemia replace K,  mag  Chronic combined CHF, h/o cad Present with dehydration Last EF 40 to 45% in April 2022 Hold Lasix  CVA in 01/2021  Non-insulin-dependent type 2 diabetes Hold metformin, currently on SSI Hold Jardiance in the setting of UTI  BPH continue Flomax  Right knee pain after fall at home Right knee x-ray:Moderate tricompartmental osteoarthritis, most prominent in the medial tibiofemoral compartment  FTT: Patient has been back home with his wife from a nursing facility for 1 to 2 weeks now and has not been eating much or drinking despite her encouragement, and then was noted to have increased confusion and dysarthria beginning the night before this admission Right knee pain after a fall at home Pt eval   Sacral skin tear Wound care order placed   Body mass index is 30.6 kg/m..  .     Skin Assessment: I have examined the patient's skin and I agree with the wound assessment as performed by the wound care RN as outlined below:  Pressure Injury 02/06/21 Sacrum Mid Unstageable - Full thickness tissue loss in which the base of the injury is covered by slough (yellow, tan, gray, green or brown) and/or eschar (tan, brown or black) in the wound bed. (Active)  02/06/21 0636  Location: Sacrum  Location Orientation: Mid  Staging: Unstageable - Full thickness tissue loss in which the base of the injury is covered by slough (yellow, tan, gray, green or brown) and/or eschar (tan, brown or black) in the wound bed.  Wound Description (Comments):   Present on Admission: Yes    Unresulted Labs (From admission, onward)          Start     Ordered   03/15/21  0500  Comprehensive metabolic panel  Daily,   R      03/14/21 2327   03/15/21 0500  CBC  Daily,   R      03/14/21 2327   03/14/21 2343  Sodium, urine, random  Add-on,   AD        03/14/21 2342   03/14/21 2343  Creatinine, urine, random  Add-on,   AD        03/14/21 2342   03/14/21 2343  Urea nitrogen, urine  Add-on,   AD         03/14/21 2342   03/14/21 2322  Culture, blood (x 2)  BLOOD CULTURE X 2,   STAT     Comments: INITIATE ANTIBIOTICS WITHIN 1 HOUR AFTER BLOOD CULTURES DRAWN.  If unable to obtain blood cultures, call MD immediately regarding antibiotic instructions.    03/14/21 2327   03/14/21 2057  Urine culture  ONCE - STAT,   STAT        03/14/21 2056            DVT prophylaxis: heparin injection 5,000 Units Start: 03/15/21 0600   Code Status: DNR Family Communication: Patient Disposition:   Status is: Inpatient  Dispo: The patient is from: Home              Anticipated d/c is to: Pending therapy eval, home with home health versus skilled nursing facility              Anticipated d/c date is: To be determined                Consultants:   Wound care  Procedures:   None  Antimicrobials:    Anti-infectives (From admission, onward)   Start     Dose/Rate Route Frequency Ordered Stop   03/15/21 2200  cefTRIAXone (ROCEPHIN) 1 g in sodium chloride 0.9 % 100 mL IVPB        1 g 200 mL/hr over 30 Minutes Intravenous Every 24 hours 03/14/21 2327     03/14/21 2115  cefTRIAXone (ROCEPHIN) 1 g in sodium chloride 0.9 % 100 mL IVPB        1 g 200 mL/hr over 30 Minutes Intravenous  Once 03/14/21 2102 03/15/21 0048          Objective: Vitals:   03/15/21 0430 03/15/21 0500 03/15/21 0530 03/15/21 0615  BP: 120/71 122/75 107/61 117/85  Pulse: (!) 114 (!) 109 (!) 109 (!) 117  Resp: 16 (!) 27 20 (!) 27  Temp:      TempSrc:      SpO2: 99% 97% 96% 98%  Weight:      Height:        Intake/Output Summary (Last 24 hours) at 03/15/2021 0745 Last data filed at 03/15/2021 0205 Gross per 24 hour  Intake 100 ml  Output 100 ml  Net 0 ml   Filed Weights   03/14/21 1511  Weight: 91.3 kg    Examination:  General exam: Frail, chronically ill-appearing, dysarthria, poor historian but AAOx3 ,calm, NAD Respiratory system: Clear to auscultation. Respiratory effort normal. Cardiovascular system: S1  & S2 heard, RRR. No JVD, no murmur, No pedal edema. Gastrointestinal system: Abdomen is nondistended, soft and nontender.  Normal bowel sounds heard. Central nervous system: Alert and oriented. No focal neurological deficits. Extremities: Generalized weakness Skin: Abrasion on skin over  right knee, sacral skin tear, see pic below Psychiatry: aaox3, appear to have poor judgment  Data Reviewed: I have personally reviewed following labs and imaging studies  CBC: Recent Labs  Lab 03/14/21 1718 03/15/21 0255  WBC 15.3* 14.0*  NEUTROABS 12.2*  --   HGB 13.9 11.6*  HCT 42.4 34.3*  MCV 88.0 84.3  PLT 296 539    Basic Metabolic Panel: Recent Labs  Lab 03/14/21 1718 03/15/21 0255  NA 134* 130*  K 3.3* 2.7*  CL 95* 97*  CO2 19* 19*  GLUCOSE 138* 143*  BUN 33* 29*  CREATININE 1.33* 1.24  CALCIUM 9.6 8.8*  MG  --  1.7    GFR: Estimated Creatinine Clearance: 61.7 mL/min (by C-G formula based on SCr of 1.24 mg/dL).  Liver Function Tests: Recent Labs  Lab 03/14/21 1718 03/15/21 0255  AST 16 12*  ALT 9 9  ALKPHOS 141* 119  BILITOT 1.1 0.8  PROT 7.7 6.7  ALBUMIN 3.3* 3.0*    CBG: Recent Labs  Lab 03/15/21 0105  GLUCAP 149*     Recent Results (from the past 240 hour(s))  Resp Panel by RT-PCR (Flu A&B, Covid) Nasopharyngeal Swab     Status: None   Collection Time: 03/14/21  5:19 PM   Specimen: Nasopharyngeal Swab; Nasopharyngeal(NP) swabs in vial transport medium  Result Value Ref Range Status   SARS Coronavirus 2 by RT PCR NEGATIVE NEGATIVE Final    Comment: (NOTE) SARS-CoV-2 target nucleic acids are NOT DETECTED.  The SARS-CoV-2 RNA is generally detectable in upper respiratory specimens during the acute phase of infection. The lowest concentration of SARS-CoV-2 viral copies this assay can detect is 138 copies/mL. A negative result does not preclude SARS-Cov-2 infection and should not be used as the sole basis for treatment or other patient  management decisions. A negative result may occur with  improper specimen collection/handling, submission of specimen other than nasopharyngeal swab, presence of viral mutation(s) within the areas targeted by this assay, and inadequate number of viral copies(<138 copies/mL). A negative result must be combined with clinical observations, patient history, and epidemiological information. The expected result is Negative.  Fact Sheet for Patients:  EntrepreneurPulse.com.au  Fact Sheet for Healthcare Providers:  IncredibleEmployment.be  This test is no t yet approved or cleared by the [place] FDA and  has been authorized for detection and/or diagnosis of SARS-CoV-2 by FDA under an Emergency Use Authorization (EUA). This EUA will remain  in effect (meaning this test can be used) for the duration of the COVID-19 declaration under Section 564(b)(1) of the Act, 21 U.S.C.section 360bbb-3(b)(1), unless the authorization is terminated  or revoked sooner.       Influenza A by PCR NEGATIVE NEGATIVE Final   Influenza B by PCR NEGATIVE NEGATIVE Final    Comment: (NOTE) The Xpert Xpress SARS-CoV-2/FLU/RSV plus assay is intended as an aid in the diagnosis of influenza from Nasopharyngeal swab specimens and should not be used as a sole basis for treatment. Nasal washings and aspirates are unacceptable for Xpert Xpress SARS-CoV-2/FLU/RSV testing.  Fact Sheet for Patients: EntrepreneurPulse.com.au  Fact Sheet for Healthcare Providers: IncredibleEmployment.be  This test is not yet approved or cleared by the [place] FDA and has been authorized for detection and/or diagnosis of SARS-CoV-2 by FDA under an Emergency Use Authorization (EUA). This EUA will remain in effect (meaning this test can be used) for the duration of the COVID-19 declaration under Section 564(b)(1) of the Act, 21 U.S.C. section 360bbb-3(b)(1),  unless the authorization is terminated or revoked.  Performed at [facility], [address]  [place], [place] [address]   Culture, blood (x 2)     Status: None (Preliminary result)   Collection Time: 03/15/21  2:40 AM   Specimen: BLOOD LEFT WRIST  Result Value Ref Range Status   Specimen Description   Final    BLOOD LEFT WRIST Performed at [facility], [address], [place], [place] [address]    Special Requests   Final    BOTTLES DRAWN AEROBIC AND ANAEROBIC Blood Culture adequate volume Performed at [facility] [address] [place],  [address]    Culture PENDING  Incomplete   Report Status PENDING  Incomplete         Radiology Studies: DG Chest 2 View  Result Date: 03/14/2021 CLINICAL DATA:  Generalized weakness EXAM: CHEST - 2 VIEW COMPARISON:  02/06/2021 chest radiograph. FINDINGS: Stable cardiomediastinal silhouette with normal heart size. No pneumothorax. No pleural effusion. Lungs appear clear, with no acute consolidative airspace disease and no pulmonary edema. IMPRESSION: No active cardiopulmonary disease. Electronically Signed   By: Ilona Sorrel M.D.   On: 03/14/2021 19:20   CT Head Wo Contrast  Result Date: 03/14/2021 CLINICAL DATA:  Delirium. Generalized weakness. Progressive confusion and slurring of speech. EXAM: CT HEAD WITHOUT CONTRAST TECHNIQUE: Contiguous axial images were obtained from the base of the skull through the vertex without intravenous contrast. COMPARISON:  CT 01/12/2021 FINDINGS: Brain: No evidence of acute infarction, hemorrhage, hydrocephalus, extra-axial collection or mass lesion/mass effect. There is moderate patchy low-attenuation within the subcortical and periventricular white matter compatible with chronic microvascular disease. Remote left cerebellar infarct appears unchanged. Vascular: No hyperdense vessel or unexpected calcification. Skull: Normal. Negative for fracture or focal  lesion. Sinuses/Orbits: No acute finding. Other: None IMPRESSION: 1. No acute intracranial abnormalities. 2. Chronic small vessel ischemic disease and brain atrophy. Electronically Signed   By: Kerby Moors M.D.   On: 03/14/2021 19:03   DG Knee Complete 4 Views Left  Result Date: 03/14/2021 CLINICAL DATA:  Left knee pain. EXAM: LEFT KNEE - COMPLETE 4+ VIEW COMPARISON:  None. FINDINGS: No evidence of fracture, dislocation, or joint effusion. Medial tibiofemoral joint space narrowing. Moderate tricompartmental peripheral spurring. No erosion or bony destruction. There are vascular calcifications. IMPRESSION: Moderate tricompartmental osteoarthritis, most prominent in the medial tibiofemoral compartment. Electronically Signed   By: Keith Rake M.D.   On: 03/14/2021 20:58        Scheduled Meds: . aspirin EC  81 mg Oral QHS  . heparin  5,000 Units Subcutaneous Q8H  . insulin aspart  0-5 Units Subcutaneous QHS  . insulin aspart  0-9 Units Subcutaneous TID WC  . pantoprazole  40 mg Oral Daily  . QUEtiapine  25 mg Oral QHS  . sertraline  25 mg Oral Daily  . simvastatin  40 mg Oral QHS  . thiamine  100 mg Oral Daily  . traZODone  50 mg Oral QHS   Continuous Infusions: . sodium chloride 85 mL/hr at 03/15/21 0017  . cefTRIAXone (ROCEPHIN)  IV       LOS: 1 day   Time spent: 57mins Greater than 50% of this time was spent in counseling, explanation of diagnosis, planning of further management, and coordination of care.   Voice Recognition Viviann Spare dictation system was used to create this note, attempts have been made to correct errors. Please contact the author with questions and/or clarifications.   Florencia Reasons, MD PhD FACP [facility]  Available via Epic secure chat 7am-7pm for nonurgent issues Please page  for urgent issues To page the attending provider between 7A-7P or the covering provider during after hours 7P-7A, please log into the web site [email or web address] and access using  universal [place] password for that web site. If you do not have the password, please call the hospital operator.    03/15/2021, 7:45 AM

## 2021-03-15 NOTE — TOC Initial Note (Signed)
Transition of Care [facility]) - Initial/Assessment Note    Patient Details  Name: Vincent [name] MRN: [id number] Date of Birth: Jul 06, 1951  Transition of Care [facility]) CM/SW Contact:    Trish Mage, LCSW Phone Number: 03/15/2021, 3:53 PM  Clinical Narrative:  Spoke to wife in follow up to PT recommendation of SNF.  She states that he could barely walk before he had the stroke due to needing both hip and knee replacement, so does not consider him "rehabable."  She plans to take him home, has some requests.  She needs HH services to start, says she was supposed to get help when he left the facility last month, but no one ever came.  Also would like to use whatever benefits are available through insurance for home aide if possible.  She states she has no help, it is just her as caregiver. No other needs identified currently. TOC will continue to follow during the course of hospitalization.                  Expected Discharge Plan: [place] Barriers to Discharge: No Barriers Identified   Patient Goals and CMS Choice     Choice offered to / list presented to : Spouse  Expected Discharge Plan and Services Expected Discharge Plan: [place]   Discharge Planning Services: CM Consult Post Acute Care Choice: [place] arrangements for the past 2 months: Single Family Home                                      Prior Living Arrangements/Services Living arrangements for the past 2 months: Single Family Home Lives with:: Spouse Patient language and need for interpreter reviewed:: Yes        Need for Family Participation in Patient Care: Yes (Comment) Care giver support system in place?: Yes (comment) Current home services: DME Criminal Activity/Legal Involvement Pertinent to Current Situation/Hospitalization: No - Comment as needed  Activities of Daily Living Home Assistive Devices/Equipment: Cane (specify quad or straight),CBG  Meter,Walker (specify type),Other (Comment) (single point cane, front wheeled walker, chole drain) ADL Screening (condition at time of admission) Patient's cognitive ability adequate to safely complete daily activities?: No Is the patient deaf or have difficulty hearing?: Yes ([place]) Does the patient have difficulty seeing, even when wearing glasses/contacts?: No Does the patient have difficulty concentrating, remembering, or making decisions?: Yes Patient able to express need for assistance with ADLs?: Yes Does the patient have difficulty dressing or bathing?: Yes Independently performs ADLs?: No Communication: Independent (speech slurred) Dressing (OT): Dependent Is this a change from baseline?: Change from baseline, expected to last >3 days Grooming: Dependent Is this a change from baseline?: Change from baseline, expected to last >3 days Feeding: Dependent Is this a change from baseline?: Change from baseline, expected to last >3 days Bathing: Dependent Is this a change from baseline?: Change from baseline, expected to last >3 days Toileting: Dependent Is this a change from baseline?: Change from baseline, expected to last >3days In/Out Bed: Dependent Is this a change from baseline?: Change from baseline, expected to last >3 days Walks in Home: Dependent Is this a change from baseline?: Change from baseline, expected to last >3 days Does the patient have difficulty walking or climbing stairs?: Yes (secondary to weakness) Weakness of Legs: Both Weakness of Arms/Hands: None  Permission Sought/Granted Permission sought to share information  with : Family Supports Permission granted to share information with : No  Share Information with NAME: Fettes,Katherine (Spouse)   [phone]           Emotional Assessment       Orientation: : Oriented to Self Alcohol / Substance Use: Not Applicable Psych Involvement: No (comment)  Admission diagnosis:  Chronic cholecystitis  [K81.1] Dehydration [E86.0] UTI (urinary tract infection) [N39.0] Arthritis of right knee [M17.11] Urinary tract infection without hematuria, site unspecified [N39.0] Patient Active Problem List   Diagnosis Date Noted  . UTI (urinary tract infection) 03/14/2021  . AKI (acute kidney injury) ([place]) 03/14/2021  . Acute encephalopathy 03/14/2021  . Hypokalemia 03/14/2021  . Depression   . Anxiety   . Severe sepsis with septic shock ([place]) 02/06/2021  . Chronic cholecystitis 02/06/2021  . Sacral decubitus ulcer 02/06/2021  . Severe sepsis ([place]) 02/06/2021  . Vitamin B12 deficiency 01/27/2021  . Diabetes 1.5, managed as type 2 ([place]) 01/27/2021  . Chronic systolic CHF (congestive heart failure) ([place]) 01/27/2021  . CVA (cerebral vascular accident) ([place]) 01/12/2021  . Weakness   . Precordial pain   . Right knee pain 01/07/2018  . History of total right knee replacement 01/07/2018  . SOB (shortness of breath) 08/12/2016  . Tachycardia 08/12/2016  . Low grade fever 08/12/2016  . Osteoarthritis of right knee 08/09/2016  . OSA (obstructive sleep apnea) 11/29/2011  . Syncope 07/24/2011  . ARM NUMBNESS 06/19/2010  . EDEMA 03/13/2010  . FASTING HYPERGLYCEMIA 03/13/2010  . CORONARY ATHEROSCLEROSIS NATIVE CORONARY ARTERY 06/20/2009  . COLONIC POLYPS, ADENOMATOUS 01/31/2009  . HLD (hyperlipidemia) 01/31/2009  . Hypertension 01/31/2009  . Coronary atherosclerosis 01/31/2009  . HEMORRHOIDS 01/31/2009  . DIVERTICULOSIS, MILD 01/31/2009   PCP:  Ginger Organ., MD Pharmacy:   [facility] [address], [place] [address] Phone: [phone] Fax: [phone]  [id number] [place], [place] [address] [place] [address] Phone: [phone] Fax: [phone]     Social Determinants of Health (SDOH) Interventions    Readmission  Risk Interventions No flowsheet data found.

## 2021-03-15 NOTE — Progress Notes (Signed)
Spoke to wife regarding patient's transfer to room 1510. Wife states that she will be up to visit tomorrow.

## 2021-03-15 NOTE — Evaluation (Signed)
Physical Therapy Evaluation Patient Details Name: Vincent [name] MRN: [id number] DOB: 1951/02/24 Today's Date: 03/15/2021   History of Present Illness  Vincent [name] is a 70 y.o. male presenting to emergency department for evaluation of anorexia, increased confusion, and dysarthria. Head CT negative for acute findings. PMH: CAD, chronic combined systolic and diastolic CHF, diabetes mellitus, depression, anxiety, recent cholecystitis with percutaneous drain, R TKA 2017    Clinical Impression  Pt admitted with above diagnosis. Eval limited due to pt requesting a few days to get over soreness, R knee pain complaints, pt also stating he needs a new gallbladder? - RN notified of pt complaints. Pt able to move LLE acttively in supine, R ankle mobility WNL and hip ~3 inches abduction and adduction. Pt unable to perform R knee flexion or SLR despite encouragement; noted abrasion on R knee and pt states it is "brokenForensic psychologist notified. Pt declines supine<>sit transfer, pulls bedrail to rotate trunk into sidelying but unable to rotate lower body and asks therapist to not assist him. Pt reports spouse at home assisting him, unsure of last ambulation attempt and no family at bedside. Will continue to attempt PT acutely, but pt educated he will need to improve participation to maximize improvements and verbalized understanding. Pt currently with functional limitations due to the deficits listed below (see PT Problem List). Pt will benefit from skilled PT to increase their independence and safety with mobility to allow discharge to the venue listed below.       Follow Up Recommendations SNF    Equipment Recommendations  None recommended by PT    Recommendations for Other Services       Precautions / Restrictions        Mobility  Bed Mobility Overal bed mobility: Needs Assistance Bed Mobility: Rolling Rolling: Max assist    General bed mobility comments: pt reaches for L bedrail rolling shoulders into  sidelying but declines assist to rotate lower body into sidelying; pt declines supine<>sit despite significant education, states he needs a few days to get over this soreness before he moves with therapy    Transfers  General transfer comment: pt declines despite significant education, states he needs "a few days to get over this soreness" before he moves with therapy  Ambulation/Gait                Stairs            Wheelchair Mobility    Modified Rankin (Stroke Patients Only)       Balance            Pertinent Vitals/Pain Pain Assessment: Faces Faces Pain Scale: Hurts even more Pain Location: RLE Pain Descriptors / Indicators: Guarding;Grimacing ("broken") Pain Intervention(s): Limited activity within patient's tolerance;Monitored during session    Home Living Family/patient expects to be discharged to:: Private residence Living Arrangements: Spouse/significant other Available Help at Discharge: Family;Available 24 hours/day Type of Home: House Home Access: Stairs to enter   [employer] of Steps: 5 Home Layout: One level Home Equipment: Cane - single point;Walker - 2 wheels;Toilet riser      Prior Function Level of Independence: Needs assistance  Comments: Pt reports hasn't been OOB or ambulated in "I have no idea" how long, spouse assists with bathing and dressing, spouse completes household chores. Per chart review, recent SNF stay.     Hand Dominance        Extremity/Trunk Assessment   Upper Extremity Assessment Upper Extremity Assessment: Generalized weakness  Lower Extremity Assessment Lower Extremity Assessment: Generalized weakness;RLE deficits/detail;LLE deficits/detail RLE Deficits / Details: ankle AROM WNL, performs ankle pumps, hip abd/add ~3 inches both directions, unable to perform SLR; noted abrasion to R knee RLE Sensation: WNL LLE Deficits / Details: AROM WNL, performs ankle pumps, knee quad set and SLR, hip  abd/add in supine LLE Sensation: WNL       Communication   Communication: Expressive difficulties (mumbled speech)  Cognition Arousal/Alertness: Awake/alert Behavior During Therapy: WFL for tasks assessed/performed Overall Cognitive Status: No family/caregiver present to determine baseline cognitive functioning  General Comments: Pt states name and date of birth appropriately, rambles numbers when asked current date before telling therapist correct year, unable to discern last time pt ambulated or was OOB and unable to elaborate on SNF stay.      General Comments      Exercises     Assessment/Plan    PT Assessment Patient needs continued PT services  PT Problem List Decreased strength;Decreased range of motion;Decreased activity tolerance;Decreased balance;Decreased mobility;Decreased cognition;Decreased knowledge of use of DME;Decreased safety awareness;Pain       PT Treatment Interventions DME instruction;Gait training;Functional mobility training;Therapeutic activities;Therapeutic exercise;Balance training;Neuromuscular re-education;Cognitive remediation;Patient/family education    PT Goals (Current goals can be found in the Care Plan section)  Acute Rehab PT Goals Patient Stated Goal: "a few days to get over this soreness" PT Goal Formulation: With patient Time For Goal Achievement: 03/29/21 Potential to Achieve Goals: Fair    Frequency Min 2X/week   Barriers to discharge        Co-evaluation               AM-PAC PT "6 Clicks" Mobility  Outcome Measure Help needed turning from your back to your side while in a flat bed without using bedrails?: Total Help needed moving from lying on your back to sitting on the side of a flat bed without using bedrails?: Total Help needed moving to and from a bed to a chair (including a wheelchair)?: Total Help needed standing up from a chair using your arms (e.g., wheelchair or bedside chair)?: Total Help needed to walk in  hospital room?: Total Help needed climbing 3-5 steps with a railing? : Total 6 Click Score: 6    End of Session   Activity Tolerance: Patient limited by pain Patient left: in bed;with call bell/phone within reach Nurse Communication: Mobility status;Other (comment) (R knee pain)      Time: 5009-3818 PT Time Calculation (min) (ACUTE ONLY): 13 min   Charges:   PT Evaluation $PT Eval Low Complexity: 1 Low           Tori Aviya Jarvie PT, DPT 03/15/21, 10:49 AM

## 2021-03-16 LAB — COMPREHENSIVE METABOLIC PANEL
ALT: 10 U/L (ref 0–44)
AST: 12 U/L — ABNORMAL LOW (ref 15–41)
Albumin: 2.8 g/dL — ABNORMAL LOW (ref 3.5–5.0)
Alkaline Phosphatase: 115 U/L (ref 38–126)
Anion gap: 10 (ref 5–15)
BUN: 13 mg/dL (ref 8–23)
CO2: 21 mmol/L — ABNORMAL LOW (ref 22–32)
Calcium: 8.8 mg/dL — ABNORMAL LOW (ref 8.9–10.3)
Chloride: 102 mmol/L (ref 98–111)
Creatinine, Ser: 0.77 mg/dL (ref 0.61–1.24)
GFR, Estimated: >60 mL/min (ref >60)
Glucose, Bld: 132 mg/dL — ABNORMAL HIGH (ref 70–99)
Potassium: 2.9 mmol/L — ABNORMAL LOW (ref 3.5–5.1)
Sodium: 133 mmol/L — ABNORMAL LOW (ref 135–145)
Total Bilirubin: 0.5 mg/dL (ref 0.3–1.2)
Total Protein: 6.3 g/dL — ABNORMAL LOW (ref 6.5–8.1)

## 2021-03-16 LAB — GLUCOSE, CAPILLARY
Glucose-Capillary: 139 mg/dL — ABNORMAL HIGH (ref 70–99)
Glucose-Capillary: 133 mg/dL — ABNORMAL HIGH (ref 70–99)
Glucose-Capillary: 115 mg/dL — ABNORMAL HIGH (ref 70–99)
Glucose-Capillary: 141 mg/dL — ABNORMAL HIGH (ref 70–99)

## 2021-03-16 LAB — CBC
HCT: 34.2 % — ABNORMAL LOW (ref 39.0–52.0)
Hemoglobin: 11.1 g/dL — ABNORMAL LOW (ref 13.0–17.0)
MCH: 28.2 pg (ref 26.0–34.0)
MCHC: 32.5 g/dL (ref 30.0–36.0)
MCV: 87 fL (ref 80.0–100.0)
Platelets: 249 10*3/uL (ref 150–400)
RBC: 3.93 MIL/uL — ABNORMAL LOW (ref 4.22–5.81)
RDW: 16 % — ABNORMAL HIGH (ref 11.5–15.5)
WBC: 9.3 10*3/uL (ref 4.0–10.5)
nRBC: 0 % (ref 0.0–0.2)

## 2021-03-16 MED ORDER — CEFDINIR 300 MG PO CAPS
300.0000 mg | ORAL_CAPSULE | Freq: Once | ORAL | Status: AC
  Administered 2021-03-16: 300 mg via ORAL
  Filled 2021-03-16: qty 1

## 2021-03-16 MED ORDER — ZINC OXIDE 12.8 % EX OINT
TOPICAL_OINTMENT | Freq: Three times a day (TID) | CUTANEOUS | Status: DC
  Filled 2021-03-16: qty 56.7

## 2021-03-16 MED ORDER — POTASSIUM CHLORIDE 10 MEQ/100ML IV SOLN: 10.0000 meq | INTRAVENOUS | Status: DC

## 2021-03-16 MED ORDER — POTASSIUM CHLORIDE CRYS ER 10 MEQ PO TBCR
40.0000 meq | EXTENDED_RELEASE_TABLET | Freq: Two times a day (BID) | ORAL | Status: AC
  Administered 2021-03-16 – 2021-03-18 (×4): 40 meq via ORAL
  Filled 2021-03-16 (×4): qty 4

## 2021-03-16 NOTE — Consult Note (Signed)
WOC Nurse Consult Note: Patient receiving care in [facility] [facility]. Patient able to turn self for area evaluation. Reason for Consult:sacral wound Wound type: There is a very shallow area of MASD-IAD to the intergluteal fold Pressure Injury POA: Yes/No/NA Measurement: Wound bed: pink Drainage (amount, consistency, odor) none Periwound: intact Dressing procedure/placement/frequency: TID application of Triple Paste and apply to buttocks, sacrum, coccyx areas.  Do NOT place a foam dressing over the triple paste. Monitor the wound area(s) for worsening of condition such as: Signs/symptoms of infection,  Increase in size,  Development of or worsening of odor, Development of pain, or increased pain at the affected locations.  Notify the medical team if any of these develop.  Thank you for the consult.  Discussed plan of care with the patient and bedside nurse.  [place] nurse will not follow at this time.  Please re-consult the [facility] team if needed.  Val Riles, RN, MSN, CWOCN, CNS-BC, pager [phone]

## 2021-03-16 NOTE — TOC Progression Note (Signed)
Transition of Care [facility]) - Progression Note    Patient Details  Name: Vincent [name] MRN: [id number] Date of Birth: 04/30/1951  [facility] CM/SW [place], [place] Phone Number: 03/16/2021, 11:06 AM  Clinical Narrative:   Called [employer] re: custodial care benefit.  Was told I need to fill out form, then call concierge to find providers, then call providers to identify one, then submit to [employer] for approval.  Awaiting call back from [employer] to find form on website. TOC will continue to follow during the course of hospitalization.     Expected Discharge Plan: [place] Barriers to Discharge: No Barriers Identified  Expected Discharge Plan and Services Expected Discharge Plan: [place]   Discharge Planning Services: CM Consult Post Acute Care Choice: [place] arrangements for the past 2 months: Single Family Home                                       Social Determinants of Health (SDOH) Interventions    Readmission Risk Interventions No flowsheet data found.

## 2021-03-16 NOTE — Progress Notes (Signed)
PROGRESS NOTE    Vincent [name]  INO:[id number] DOB: January 17, 1951 DOA: 03/14/2021 PCP: Ginger Organ., MD    Chief Complaint  Patient presents with   Weakness    Brief Narrative:  H/o non-insulin-dependent DMII, history of CVA ,CAD s/p pci to lad, combined CHF, recent history of acute on chronic cholecystitis underwent PERC cholecystostomy drain placement by IR on 5/3, finished 14 days of antibiotic treatment, presenting to emergency department for evaluation of anorexia, increased confusion, and dysarthria. Found to have sepsis and possible UTI  Subjective:   Aaox3, some dysarthria He is not a good historian, does appear to have memory impairment and poor insightand   Assessment & Plan:   Principal Problem:   UTI (urinary tract infection) Active Problems:   CVA (cerebral vascular accident) ([place])   Diabetes 1.5, managed as type 2 ([place])   Chronic systolic CHF (congestive heart failure) ([place])   Chronic cholecystitis   Severe sepsis (HCC)   AKI (acute kidney injury) ([place])   Hypokalemia   Depression   Anxiety   Sepsis likely secondary to UTI, present on admission -Patient presented with tachycardia, tachypnea, leukocytosis, AKI, acute metabolic encephalopathy -UA with many bacteria, large leukocyte ,urine culture grew E. coli final result pending  -blood culture + staph heamolyticus on one sets of blood culture, likely contamination  - chest x-ray no acute findings -Patient does has risk factor of UTI with history of BPH also on Jardiance -Continue Rocephin, hold Jardiance  Acute metabolic encephalopathy -CT head no acute findings, ammonia level unremarkable -likely due to sepsis, monitor  AKI Creatinine baseline 0.46 BUN 33/creatinine 1.33 on presentation BUN 13 /creatinine 0.7 today Hold Lasix, hold metformin,  D/c  hydration, repeat BMP in the morning, renal dosing medication  Hyponatremia Likely due to dehydration due to poor oral intake Sodium improved  after hydration , off hydration today, encourage oral intake, repeat BMP in the morning  Hypokalemia/hypomagnesemia  Remain low, continue to replace K, mag  Chronic combined CHF, h/o cad Present with dehydration Last EF 40 to 45% in April 2022 Hold Lasix  CVA in 01/2021  Non-insulin-dependent type 2 diabetes A1c 7.2 in 01/2021 Hold metformin, currently on SSI Hold Jardiance in the setting of UTI  BPH continue Flomax  Right knee pain after fall at home Right knee x-ray:Moderate tricompartmental osteoarthritis, most prominent in the medial tibiofemoral compartment  FTT: Patient has been back home with his wife from a nursing facility for 1 to 2 weeks now and has not been eating much or drinking despite her encouragement, and then was noted to have increased confusion and dysarthria beginning the night before this admission Right knee pain after a fall at home Pt eval , wife does not want to go to skilled nursing facility, she request home health, appreciate transitional care input  Sacral skin tear, moisture related Wound care order placed, input appreciated   Body mass index is 30.6 kg/m..  .    Unresulted Labs (From admission, onward)     Start     Ordered   03/15/21 0500  Comprehensive metabolic panel  Daily,   R      03/14/21 2327   03/15/21 0500  CBC  Daily,   R      03/14/21 2327   03/14/21 2343  Sodium, urine, random  Add-on,   AD        03/14/21 2342   03/14/21 2343  Creatinine, urine, random  Add-on,   AD  03/14/21 2342   03/14/21 2343  Urea nitrogen, urine  Add-on,   AD        03/14/21 2342              DVT prophylaxis: heparin injection 5,000 Units Start: 03/15/21 0600   Code Status: DNR Family Communication: Patient Disposition:   Status is: Inpatient  Dispo: The patient is from: Home              Anticipated d/c is to: Family declined skilled nursing facility placement, will maximize home health              Anticipated d/c date is:  24 to 48 hours, pending culture result and electrolyte abnormalities                Consultants:  Wound care  Procedures:  None  Antimicrobials:    Anti-infectives (From admission, onward)    Start     Dose/Rate Route Frequency Ordered Stop   03/17/21 0600  vancomycin (VANCOREADY) IVPB 1500 mg/300 mL  Status:  Discontinued        1,500 mg 150 mL/hr over 120 Minutes Intravenous Every 24 hours 03/15/21 2143 03/16/21 1206   03/15/21 2230  vancomycin (VANCOREADY) IVPB 1750 mg/350 mL        1,750 mg 175 mL/hr over 120 Minutes Intravenous  Once 03/15/21 2141 03/16/21 0152   03/15/21 2200  cefTRIAXone (ROCEPHIN) 1 g in sodium chloride 0.9 % 100 mL IVPB        1 g 200 mL/hr over 30 Minutes Intravenous Every 24 hours 03/14/21 2327     03/14/21 2115  cefTRIAXone (ROCEPHIN) 1 g in sodium chloride 0.9 % 100 mL IVPB        1 g 200 mL/hr over 30 Minutes Intravenous  Once 03/14/21 2102 03/15/21 0048           Objective: Vitals:   03/15/21 2219 03/16/21 0221 03/16/21 0535 03/16/21 1407  BP: 121/70 115/72 118/72 104/65  Pulse: (!) 103 (!) 110 (!) 106 (!) 107  Resp: 18 18 18 16   Temp: (!) 97.4 F (36.3 C) 97.6 F (36.4 C) 97.7 F (36.5 C) 98.2 F (36.8 C)  TempSrc:    Oral  SpO2: 98% 98% 97% 98%  Weight:      Height:        Intake/Output Summary (Last 24 hours) at 03/16/2021 1720 Last data filed at 03/16/2021 1100 Gross per 24 hour  Intake 128.75 ml  Output 2625 ml  Net -2496.25 ml   Filed Weights   03/14/21 1511  Weight: 91.3 kg    Examination:  General exam: Frail, chronically ill-appearing, dysarthria, poor historian but AAOx3 ,calm, NAD Respiratory system: Clear to auscultation. Respiratory effort normal. Cardiovascular system: S1 & S2 heard, RRR. No JVD, no murmur, No pedal edema. Gastrointestinal system: Abdomen is nondistended, soft and nontender.  Normal bowel sounds heard. Central nervous system: Alert and oriented. No focal neurological deficits. Extremities:  Generalized weakness Skin: Abrasion on skin over  right knee, sacral skin tear, see pic below Psychiatry: aaox3, appear to have poor judgment        Data Reviewed: I have personally reviewed following labs and imaging studies  CBC: Recent Labs  Lab 03/14/21 1718 03/15/21 0255 03/16/21 0827  WBC 15.3* 14.0* 9.3  NEUTROABS 12.2*  --   --   HGB 13.9 11.6* 11.1*  HCT 42.4 34.3* 34.2*  MCV 88.0 84.3 87.0  PLT 296 291 249    Basic  Metabolic Panel: Recent Labs  Lab 03/14/21 1718 03/15/21 0255 03/16/21 0827  NA 134* 130* 133*  K 3.3* 2.7* 2.9*  CL 95* 97* 102  CO2 19* 19* 21*  GLUCOSE 138* 143* 132*  BUN 33* 29* 13  CREATININE 1.33* 1.24 0.77  CALCIUM 9.6 8.8* 8.8*  MG  --  1.7  --     GFR: Estimated Creatinine Clearance: 95.7 mL/min (by C-G formula based on SCr of 0.77 mg/dL).  Liver Function Tests: Recent Labs  Lab 03/14/21 1718 03/15/21 0255 03/16/21 0827  AST 16 12* 12*  ALT 9 9 10   ALKPHOS 141* 119 115  BILITOT 1.1 0.8 0.5  PROT 7.7 6.7 6.3*  ALBUMIN 3.3* 3.0* 2.8*    CBG: Recent Labs  Lab 03/15/21 1715 03/15/21 2156 03/16/21 0750 03/16/21 1152 03/16/21 1611  GLUCAP 166* 177* 139* 133* 115*     Recent Results (from the past 240 hour(s))  Urine culture     Status: Abnormal (Preliminary result)   Collection Time: 03/14/21  5:18 PM   Specimen: Urine, Clean Catch  Result Value Ref Range Status   Specimen Description   Final    URINE, CLEAN CATCH Performed at [facility], [place] [address] [place], [place] [address]    Special Requests   Final    NONE Performed at [facility], [place] [address] [place], [place] [address]    Culture >=100,000 COLONIES/mL ESCHERICHIA COLI (A)  Final   Report Status PENDING  Incomplete  Resp Panel by RT-PCR (Flu A&B, Covid) Nasopharyngeal Swab     Status: None   Collection Time: 03/14/21  5:19 PM   Specimen: Nasopharyngeal Swab; Nasopharyngeal(NP) swabs in vial transport  medium  Result Value Ref Range Status   SARS Coronavirus 2 by RT PCR NEGATIVE NEGATIVE Final    Comment: (NOTE) SARS-CoV-2 target nucleic acids are NOT DETECTED.  The SARS-CoV-2 RNA is generally detectable in upper respiratory specimens during the acute phase of infection. The lowest concentration of SARS-CoV-2 viral copies this assay can detect is 138 copies/mL. A negative result does not preclude SARS-Cov-2 infection and should not be used as the sole basis for treatment or other patient management decisions. A negative result may occur with  improper specimen collection/handling, submission of specimen other than nasopharyngeal swab, presence of viral mutation(s) within the areas targeted by this assay, and inadequate number of viral copies(<138 copies/mL). A negative result must be combined with clinical observations, patient history, and epidemiological information. The expected result is Negative.  Fact Sheet for Patients:  EntrepreneurPulse.com.au  Fact Sheet for Healthcare Providers:  IncredibleEmployment.be  This test is no t yet approved or cleared by the [place] FDA and  has been authorized for detection and/or diagnosis of SARS-CoV-2 by FDA under an Emergency Use Authorization (EUA). This EUA will remain  in effect (meaning this test can be used) for the duration of the COVID-19 declaration under Section 564(b)(1) of the Act, 21 U.S.C.section 360bbb-3(b)(1), unless the authorization is terminated  or revoked sooner.       Influenza A by PCR NEGATIVE NEGATIVE Final   Influenza B by PCR NEGATIVE NEGATIVE Final    Comment: (NOTE) The Xpert Xpress SARS-CoV-2/FLU/RSV plus assay is intended as an aid in the diagnosis of influenza from Nasopharyngeal swab specimens and should not be used as a sole basis for treatment. Nasal washings and aspirates are unacceptable for Xpert Xpress SARS-CoV-2/FLU/RSV testing.  Fact Sheet for  Patients: EntrepreneurPulse.com.au  Fact Sheet for  Healthcare Providers: IncredibleEmployment.be  This test is not yet approved or cleared by the [place] and has been authorized for detection and/or diagnosis of SARS-CoV-2 by FDA under an Emergency Use Authorization (EUA). This EUA will remain in effect (meaning this test can be used) for the duration of the COVID-19 declaration under Section 564(b)(1) of the Act, 21 U.S.C. section 360bbb-3(b)(1), unless the authorization is terminated or revoked.  Performed at [facility], [place] [address], [place], [place] [address]   Culture, blood (x 2)     Status: Abnormal (Preliminary result)   Collection Time: 03/15/21  2:40 AM   Specimen: BLOOD LEFT WRIST  Result Value Ref Range Status   Specimen Description   Final    BLOOD LEFT WRIST Performed at [facility] [address], [place], [place] [address]    Special Requests   Final    BOTTLES DRAWN AEROBIC AND ANAEROBIC Blood Culture adequate volume Performed at [facility] [address], [place], [place] [address]    Culture  Setup Time   Final    GRAM POSITIVE COCCI IN CLUSTERS IN BOTH AEROBIC AND ANAEROBIC BOTTLES Organism ID to follow CRITICAL RESULT CALLED TO, READ BACK BY AND VERIFIED WITH: PHARMD L POINDEXTER [id number] 2123 MLM    Culture (A)  Final    STAPHYLOCOCCUS HAEMOLYTICUS THE SIGNIFICANCE OF ISOLATING THIS ORGANISM FROM A SINGLE SET OF BLOOD CULTURES WHEN MULTIPLE SETS ARE DRAWN IS UNCERTAIN. PLEASE NOTIFY THE MICROBIOLOGY DEPARTMENT WITHIN ONE WEEK IF SPECIATION AND SENSITIVITIES ARE REQUIRED. Performed at [facility], [place] [address], [place],  [address]    Report Status PENDING  Incomplete  Blood Culture ID Panel (Reflexed)     Status: Abnormal   Collection Time: 03/15/21  2:40 AM  Result Value Ref Range Status   Enterococcus faecalis NOT DETECTED NOT DETECTED  Final   Enterococcus Faecium NOT DETECTED NOT DETECTED Final   Listeria monocytogenes NOT DETECTED NOT DETECTED Final   Staphylococcus species DETECTED (A) NOT DETECTED Final    Comment: CRITICAL RESULT CALLED TO, READ BACK BY AND VERIFIED WITH: Yevette Edwards [id number] 2123 MLM    Staphylococcus aureus (BCID) NOT DETECTED NOT DETECTED Final   Staphylococcus epidermidis NOT DETECTED NOT DETECTED Final   Staphylococcus lugdunensis NOT DETECTED NOT DETECTED Final   Streptococcus species NOT DETECTED NOT DETECTED Final   Streptococcus agalactiae NOT DETECTED NOT DETECTED Final   Streptococcus pneumoniae NOT DETECTED NOT DETECTED Final   Streptococcus pyogenes NOT DETECTED NOT DETECTED Final   A.calcoaceticus-baumannii NOT DETECTED NOT DETECTED Final   Bacteroides fragilis NOT DETECTED NOT DETECTED Final   Enterobacterales NOT DETECTED NOT DETECTED Final   Enterobacter cloacae complex NOT DETECTED NOT DETECTED Final   Escherichia coli NOT DETECTED NOT DETECTED Final   Klebsiella aerogenes NOT DETECTED NOT DETECTED Final   Klebsiella oxytoca NOT DETECTED NOT DETECTED Final   Klebsiella pneumoniae NOT DETECTED NOT DETECTED Final   Proteus species NOT DETECTED NOT DETECTED Final   Salmonella species NOT DETECTED NOT DETECTED Final   Serratia marcescens NOT DETECTED NOT DETECTED Final   Haemophilus influenzae NOT DETECTED NOT DETECTED Final   Neisseria meningitidis NOT DETECTED NOT DETECTED Final   Pseudomonas aeruginosa NOT DETECTED NOT DETECTED Final   Stenotrophomonas maltophilia NOT DETECTED NOT DETECTED Final   Candida albicans NOT DETECTED NOT DETECTED Final   Candida auris NOT DETECTED NOT DETECTED Final   Candida glabrata NOT DETECTED NOT DETECTED Final  Candida krusei NOT DETECTED NOT DETECTED Final   Candida parapsilosis NOT DETECTED NOT DETECTED Final   Candida tropicalis NOT DETECTED NOT DETECTED Final   Cryptococcus neoformans/gattii NOT DETECTED NOT DETECTED Final     Comment: Performed at [facility], [place] [address], [place], [place] [address]  Culture, blood (x 2)     Status: None (Preliminary result)   Collection Time: 03/15/21  2:44 PM   Specimen: BLOOD LEFT FOREARM  Result Value Ref Range Status   Specimen Description   Final    BLOOD LEFT FOREARM Performed at [facility] [address] [place], [place] [address]    Special Requests   Final    BOTTLES DRAWN AEROBIC AND ANAEROBIC Blood Culture adequate volume Performed at [facility] [address], [place], [place] [address]    Culture   Final    NO GROWTH < 24 HOURS Performed at [facility] [address], [place], [place] [address]    Report Status PENDING  Incomplete         Radiology Studies: DG Chest 2 View  Result Date: 03/14/2021 CLINICAL DATA:  Generalized weakness EXAM: CHEST - 2 VIEW COMPARISON:  02/06/2021 chest radiograph. FINDINGS: Stable cardiomediastinal silhouette with normal heart size. No pneumothorax. No pleural effusion. Lungs appear clear, with no acute consolidative airspace disease and no pulmonary edema. IMPRESSION: No active cardiopulmonary disease. Electronically Signed   By: Ilona Sorrel M.D.   On: 03/14/2021 19:20   CT Head Wo Contrast  Result Date: 03/14/2021 CLINICAL DATA:  Delirium. Generalized weakness. Progressive confusion and slurring of speech. EXAM: CT HEAD WITHOUT CONTRAST TECHNIQUE: Contiguous axial images were obtained from the base of the skull through the vertex without intravenous contrast. COMPARISON:  CT 01/12/2021 FINDINGS: Brain: No evidence of acute infarction, hemorrhage, hydrocephalus, extra-axial collection or mass lesion/mass effect. There is moderate patchy low-attenuation within the subcortical and periventricular white matter compatible with chronic microvascular disease. Remote left cerebellar infarct appears unchanged. Vascular: No hyperdense vessel or unexpected calcification.  Skull: Normal. Negative for fracture or focal lesion. Sinuses/Orbits: No acute finding. Other: None IMPRESSION: 1. No acute intracranial abnormalities. 2. Chronic small vessel ischemic disease and brain atrophy. Electronically Signed   By: Kerby Moors M.D.   On: 03/14/2021 19:03   DG Knee Complete 4 Views Left  Result Date: 03/14/2021 CLINICAL DATA:  Left knee pain. EXAM: LEFT KNEE - COMPLETE 4+ VIEW COMPARISON:  None. FINDINGS: No evidence of fracture, dislocation, or joint effusion. Medial tibiofemoral joint space narrowing. Moderate tricompartmental peripheral spurring. No erosion or bony destruction. There are vascular calcifications. IMPRESSION: Moderate tricompartmental osteoarthritis, most prominent in the medial tibiofemoral compartment. Electronically Signed   By: Keith Rake M.D.   On: 03/14/2021 20:58         Scheduled Meds:  aspirin EC  81 mg Oral QHS   heparin  5,000 Units Subcutaneous Q8H   insulin aspart  0-5 Units Subcutaneous QHS   insulin aspart  0-9 Units Subcutaneous TID WC   pantoprazole  40 mg Oral Daily   QUEtiapine  25 mg Oral QHS   sertraline  25 mg Oral Daily   simvastatin  40 mg Oral QHS   thiamine  100 mg Oral Daily   traZODone  50 mg Oral QHS   Zinc Oxide   Topical TID   Continuous Infusions:  sodium chloride 50 mL/hr at 03/15/21 2000   cefTRIAXone (ROCEPHIN)  IV 1 g (03/15/21 2226)  potassium chloride       LOS: 2 days   Time spent: 8mins Greater than 50% of this time was spent in counseling, explanation of diagnosis, planning of further management, and coordination of care.   Voice Recognition Viviann Spare dictation system was used to create this note, attempts have been made to correct errors. Please contact the author with questions and/or clarifications.   Florencia Reasons, MD PhD FACP [facility]  Available via Epic secure chat 7am-7pm for nonurgent issues Please page for urgent issues To page the attending provider between 7A-7P or the  covering provider during after hours 7P-7A, please log into the web site [email or web address] and access using universal [place] password for that web site. If you do not have the password, please call the hospital operator.    03/16/2021, 5:20 PM

## 2021-03-16 NOTE — TOC Progression Note (Signed)
Transition of Care [facility]) - Progression Note    Patient Details  Name: ZADKIEL DRAGAN MRN: [id number] Date of Birth: December 07, 1950  Transition [facility] [place] CM/SW [place], [place] Phone Number: 03/16/2021, 2:33 PM  Clinical Narrative:   Madilyn Hook with [facility] confirmed that they would be able to provide [facility] RN services for patient.  According to [employer] website, [facility] is also in network to provide PCS in home.  Will contact them to see if they are able to service the patient as well, and then submit information for prior authorization as instructed by [employer] [phone]. [facility] is unavailable in [place] area.  Calling [facility], who say they can service if no smoking nor pets in the home.  Will get that info and get back to them. TOC will continue to follow during the course of hospitalization.     Expected Discharge Plan: [place] Barriers to Discharge: No Barriers Identified  Expected Discharge Plan and Services Expected Discharge Plan: [place]   Discharge Planning Services: CM Consult Post Acute Care Choice: [place] arrangements for the past 2 months: Single Family Home                                       Social Determinants of Health (SDOH) Interventions    Readmission Risk Interventions No flowsheet data found.

## 2021-03-17 DIAGNOSIS — Z7189 Other specified counseling: Secondary | ICD-10-CM

## 2021-03-17 LAB — CBC
HCT: 35.9 % — ABNORMAL LOW (ref 39.0–52.0)
Hemoglobin: 11.7 g/dL — ABNORMAL LOW (ref 13.0–17.0)
MCH: 28.7 pg (ref 26.0–34.0)
MCHC: 32.6 g/dL (ref 30.0–36.0)
MCV: 88 fL (ref 80.0–100.0)
Platelets: 264 10*3/uL (ref 150–400)
RBC: 4.08 MIL/uL — ABNORMAL LOW (ref 4.22–5.81)
RDW: 15.9 % — ABNORMAL HIGH (ref 11.5–15.5)
WBC: 8.3 10*3/uL (ref 4.0–10.5)
nRBC: 0 % (ref 0.0–0.2)

## 2021-03-17 LAB — BASIC METABOLIC PANEL
Anion gap: 9 (ref 5–15)
BUN: 13 mg/dL (ref 8–23)
CO2: 23 mmol/L (ref 22–32)
Calcium: 9.1 mg/dL (ref 8.9–10.3)
Chloride: 104 mmol/L (ref 98–111)
Creatinine, Ser: 0.67 mg/dL (ref 0.61–1.24)
GFR, Estimated: >60 mL/min (ref >60)
Glucose, Bld: 125 mg/dL — ABNORMAL HIGH (ref 70–99)
Potassium: 2.9 mmol/L — ABNORMAL LOW (ref 3.5–5.1)
Sodium: 136 mmol/L (ref 135–145)

## 2021-03-17 LAB — URINE CULTURE: Culture: >=100000 — AB

## 2021-03-17 LAB — MAGNESIUM: Magnesium: 1.7 mg/dL (ref 1.7–2.4)

## 2021-03-17 LAB — CULTURE, BLOOD (ROUTINE X 2): Special Requests: ADEQUATE

## 2021-03-17 LAB — GLUCOSE, CAPILLARY
Glucose-Capillary: 130 mg/dL — ABNORMAL HIGH (ref 70–99)
Glucose-Capillary: 148 mg/dL — ABNORMAL HIGH (ref 70–99)
Glucose-Capillary: 131 mg/dL — ABNORMAL HIGH (ref 70–99)
Glucose-Capillary: 120 mg/dL — ABNORMAL HIGH (ref 70–99)

## 2021-03-17 MED ORDER — CEFDINIR 300 MG PO CAPS
300.0000 mg | ORAL_CAPSULE | Freq: Two times a day (BID) | ORAL | Status: DC
  Administered 2021-03-17 – 2021-03-18 (×2): 300 mg via ORAL
  Filled 2021-03-17 (×2): qty 1

## 2021-03-17 MED ORDER — POTASSIUM CHLORIDE CRYS ER 10 MEQ PO TBCR
40.0000 meq | EXTENDED_RELEASE_TABLET | Freq: Once | ORAL | Status: AC
  Administered 2021-03-17: 40 meq via ORAL
  Filled 2021-03-17: qty 4

## 2021-03-17 MED ORDER — MAGNESIUM OXIDE -MG SUPPLEMENT 400 (240 MG) MG PO TABS
400.0000 mg | ORAL_TABLET | Freq: Every day | ORAL | Status: AC
  Administered 2021-03-17 – 2021-03-18 (×2): 400 mg via ORAL
  Filled 2021-03-17 (×2): qty 1

## 2021-03-17 NOTE — Care Management Important Message (Signed)
Important Message  Patient Details IM Letter given to the Patient. Name: Vincent [name] MRN: [id number] Date of Birth: Dec 12, 1950   Medicare Important Message Given:  Yes     Kerin Salen 03/17/2021, 11:05 AM

## 2021-03-17 NOTE — Clinical Social Work Note (Cosign Needed)
    Durable Medical Equipment  (From admission, onward)           Start     Ordered   03/16/21 1551  For home use only DME Hospital bed  Once       Question Answer Comment  Length of Need Lifetime   The above medical condition requires: Patient requires the ability to reposition frequently   Head must be elevated greater than: 30 degrees   Bed type Semi-electric      03/16/21 1550

## 2021-03-17 NOTE — Progress Notes (Signed)
PROGRESS NOTE    Vincent [name]  KNL:[id number] DOB: Oct 30, 1950 DOA: 03/14/2021 PCP: Ginger Organ., MD    Chief Complaint  Patient presents with   Weakness    Brief Narrative:  H/o non-insulin-dependent DMII, history of CVA ,CAD s/p pci to lad, combined CHF, recent history of acute on chronic cholecystitis underwent PERC cholecystostomy drain placement by IR on 5/3, finished 14 days of antibiotic treatment, presenting to emergency department for evaluation of anorexia, increased confusion, and dysarthria. Found to have sepsis and possible UTI  Subjective:  Persistent hypokalemia despite aggressive supplement, appears to have poor oral intake, there is no documentation of BM/diarrhea , he cannot provide reliable history Aaox3, some dysarthria He is not a good historian, does appear to have memory impairment and poor insightand   Assessment & Plan:   Principal Problem:   UTI (urinary tract infection) Active Problems:   CVA (cerebral vascular accident) ([place])   Diabetes 1.5, managed as type 2 ([place])   Chronic systolic CHF (congestive heart failure) ([place])   Chronic cholecystitis   Severe sepsis ([facility])   AKI (acute kidney injury) ([place])   Hypokalemia   Depression   Anxiety   Sepsis likely secondary to UTI, present on admission -Patient presented with tachycardia, tachypnea, leukocytosis, AKI, acute metabolic encephalopathy -UA with many bacteria, large leukocyte ,urine culture grew E. coli , resistant to ampicillin, Unasyn, Bactrim, sensitive to cefazolin, Cipro, Zosyn -blood culture + staph heamolyticus on one sets of blood culture, likely contamination  - chest x-ray no acute findings -Patient does has risk factor of UTI with history of BPH also on Jardiance -received Rocephin, changed to oral omnicef to finish total of 7 days treatment, hold Jardiance  Acute metabolic encephalopathy -CT head no acute findings, ammonia level unremarkable -likely due to sepsis,  monitor  AKI Creatinine baseline 0.46 BUN 33/creatinine 1.33 on presentation BUN 13 /creatinine 0.67 today Hold Lasix, hold metformin,  Appear to have poor oral intake, push fluids, consider to change Lasix to daily as needed at discharge D/c  hydration, repeat BMP in the morning, renal dosing medication  Hyponatremia Likely due to dehydration due to poor oral intake and continued Lasix use, plan to discharge home Lasix as needed instead of daily Sodium improved after hydration , off hydration today, encourage oral intake, repeat BMP in the morning  Hypokalemia/hypomagnesemia  Remain low despite aggressive supplement, he refused IV placement, will give additional dose of potassium today, continue oral magnesium supplement Remain low, continue to replace K, mag  He was recently hospitalized for acute on chronic cholecystitis and underwent  Percutaneous drain placed by IR on 02/08/21 and 14 days of antibiotics were completed - Surgery recommended keeping drain in at least 6 weeks before possible cholecystectomy  - Continue drain care  Follow-up with IR and general surgery   Chronic combined CHF, h/o cad Present with dehydration Last EF 40 to 45% in April 2022 Hold Lasix, has poor oral intake, plan to change Lasix to as needed basis at discharge  CVA in 01/2021 with dysarthria and poor memory Has been bedbound since after the stroke   Non-insulin-dependent type 2 diabetes A1c 7.2 in 01/2021 Hold metformin, currently on SSI Hold Jardiance in the setting of UTI A.m. blood glucose 125  BPH continue Flomax  Right knee pain after fall at home Right knee x-ray:Moderate tricompartmental osteoarthritis, most prominent in the medial tibiofemoral compartment  FTT/goals of care discussion: Patient has been back home with his wife from a nursing  facility for 1 to 2 weeks now and has not been eating much or drinking despite her encouragement, and then was noted to have increased confusion and  dysarthria beginning the night before this admission Right knee pain after a fall at home Pt eval , wife does not want to go to skilled nursing facility, she request home health, hospital bed ,appreciate transitional care input Wife reports patient has been bedbound since after the stroke in 01/2021, has poor oral intake, 50lbs weight loss in the last two months,  Patient was seen by palliative care before hospitalization , he is to continue follow palliative care Patient will need ambulance transport to doctor's appointment due to bedbound status, will discuss with social worker May benefit from Doctor making house call, but he will need to see IR for drain care unless he is to transition to hospice in the near future.  Sacral skin tear, moisture related Wound care order placed, input appreciated   Body mass index is 30.6 kg/m..  .    Unresulted Labs (From admission, onward)     Start     Ordered   03/18/21 0347  Basic metabolic panel  Tomorrow morning,   R       Question:  Specimen collection method  Answer:  Lab=Lab collect   03/17/21 1649   03/18/21 0500  Magnesium  Tomorrow morning,   R       Question:  Specimen collection method  Answer:  Lab=Lab collect   03/17/21 1649   03/14/21 2343  Sodium, urine, random  Add-on,   AD        03/14/21 2342   03/14/21 2343  Creatinine, urine, random  Add-on,   AD        03/14/21 2342   03/14/21 2343  Urea nitrogen, urine  Add-on,   AD        03/14/21 2342              DVT prophylaxis: heparin injection 5,000 Units Start: 03/15/21 0600   Code Status: DNR Family Communication: Wife over the phone on 6/10 Disposition:   Status is: Inpatient  Dispo: The patient is from: Home              Anticipated d/c is to: Family declined skilled nursing facility placement, will maximize home health, palliative care already set up              Anticipated d/c date is: likely tomorrow , will need ambulance transportation at discharge                 Consultants:  Wound care  Procedures:  None  Antimicrobials:    Anti-infectives (From admission, onward)    Start     Dose/Rate Route Frequency Ordered Stop   03/17/21 2200  cefdinir (OMNICEF) capsule 300 mg        300 mg Oral Every 12 hours 03/17/21 1639     03/17/21 0600  vancomycin (VANCOREADY) IVPB 1500 mg/300 mL  Status:  Discontinued        1,500 mg 150 mL/hr over 120 Minutes Intravenous Every 24 hours 03/15/21 2143 03/16/21 1206   03/16/21 2330  cefdinir (OMNICEF) capsule 300 mg        300 mg Oral  Once 03/16/21 2239 03/16/21 2315   03/15/21 2230  vancomycin (VANCOREADY) IVPB 1750 mg/350 mL        1,750 mg 175 mL/hr over 120 Minutes Intravenous  Once 03/15/21 2141 03/16/21 0152  03/15/21 2200  cefTRIAXone (ROCEPHIN) 1 g in sodium chloride 0.9 % 100 mL IVPB  Status:  Discontinued        1 g 200 mL/hr over 30 Minutes Intravenous Every 24 hours 03/14/21 2327 03/17/21 1639   03/14/21 2115  cefTRIAXone (ROCEPHIN) 1 g in sodium chloride 0.9 % 100 mL IVPB        1 g 200 mL/hr over 30 Minutes Intravenous  Once 03/14/21 2102 03/15/21 0048           Objective: Vitals:   03/16/21 1407 03/16/21 2138 03/17/21 0543 03/17/21 1437  BP: 104/65 113/85 109/69 112/69  Pulse: (!) 107 (!) 102 (!) 104 (!) 109  Resp: 16 17 17 16   Temp: 98.2 F (36.8 C) 98.5 F (36.9 C) 98.3 F (36.8 C) 97.9 F (36.6 C)  TempSrc: Oral Oral Oral Oral  SpO2: 98% 99% 100% 97%  Weight:      Height:        Intake/Output Summary (Last 24 hours) at 03/17/2021 1824 Last data filed at 03/17/2021 1600 Gross per 24 hour  Intake 960 ml  Output 3050 ml  Net -2090 ml   Filed Weights   03/14/21 1511  Weight: 91.3 kg    Examination:  General exam: Frail, chronically ill-appearing, dysarthria, poor historian but AAOx3 ,calm, NAD Respiratory system: Clear to auscultation. Respiratory effort normal. Cardiovascular system: S1 & S2 heard, RRR. No JVD, no murmur, No pedal edema. Gastrointestinal  system: Abdomen is nondistended, soft and nontender.  Normal bowel sounds heard. Central nervous system: Alert and oriented. No focal neurological deficits. Extremities: Generalized weakness Skin: Abrasion on skin over  right knee, sacral skin tear, see pic below Psychiatry: aaox3, appear to have poor judgment        Data Reviewed: I have personally reviewed following labs and imaging studies  CBC: Recent Labs  Lab 03/14/21 1718 03/15/21 0255 03/16/21 0827 03/17/21 0513  WBC 15.3* 14.0* 9.3 8.3  NEUTROABS 12.2*  --   --   --   HGB 13.9 11.6* 11.1* 11.7*  HCT 42.4 34.3* 34.2* 35.9*  MCV 88.0 84.3 87.0 88.0  PLT 296 291 249 294    Basic Metabolic Panel: Recent Labs  Lab 03/14/21 1718 03/15/21 0255 03/16/21 0827 03/17/21 0513  NA 134* 130* 133* 136  K 3.3* 2.7* 2.9* 2.9*  CL 95* 97* 102 104  CO2 19* 19* 21* 23  GLUCOSE 138* 143* 132* 125*  BUN 33* 29* 13 13  CREATININE 1.33* 1.24 0.77 0.67  CALCIUM 9.6 8.8* 8.8* 9.1  MG  --  1.7  --  1.7    GFR: Estimated Creatinine Clearance: 95.7 mL/min (by C-G formula based on SCr of 0.67 mg/dL).  Liver Function Tests: Recent Labs  Lab 03/14/21 1718 03/15/21 0255 03/16/21 0827  AST 16 12* 12*  ALT 9 9 10   ALKPHOS 141* 119 115  BILITOT 1.1 0.8 0.5  PROT 7.7 6.7 6.3*  ALBUMIN 3.3* 3.0* 2.8*    CBG: Recent Labs  Lab 03/16/21 1611 03/16/21 2139 03/17/21 0724 03/17/21 1140 03/17/21 1751  GLUCAP 115* 141* 130* 148* 131*     Recent Results (from the past 240 hour(s))  Urine culture     Status: Abnormal   Collection Time: 03/14/21  5:18 PM   Specimen: Urine, Clean Catch  Result Value Ref Range Status   Specimen Description   Final    URINE, CLEAN CATCH Performed at [facility], [place] [address] [place], [place] [address]  Special Requests   Final    NONE Performed at [facility], [place] [address], [place], [place] [address]    Culture >=100,000 COLONIES/mL  ESCHERICHIA COLI (A)  Final   Report Status 03/17/2021 FINAL  Final   Organism ID, Bacteria ESCHERICHIA COLI (A)  Final      Susceptibility   Escherichia coli - MIC*    AMPICILLIN >=32 RESISTANT Resistant     CEFAZOLIN <=4 SENSITIVE Sensitive     CEFEPIME <=0.12 SENSITIVE Sensitive     CEFTRIAXONE <=0.25 SENSITIVE Sensitive     CIPROFLOXACIN 1 SENSITIVE Sensitive     GENTAMICIN >=16 RESISTANT Resistant     IMIPENEM <=0.25 SENSITIVE Sensitive     NITROFURANTOIN <=16 SENSITIVE Sensitive     TRIMETH/SULFA >=320 RESISTANT Resistant     AMPICILLIN/SULBACTAM >=32 RESISTANT Resistant     PIP/TAZO <=4 SENSITIVE Sensitive     * >=100,000 COLONIES/mL ESCHERICHIA COLI  Resp Panel by RT-PCR (Flu A&B, Covid) Nasopharyngeal Swab     Status: None   Collection Time: 03/14/21  5:19 PM   Specimen: Nasopharyngeal Swab; Nasopharyngeal(NP) swabs in vial transport medium  Result Value Ref Range Status   SARS Coronavirus 2 by RT PCR NEGATIVE NEGATIVE Final    Comment: (NOTE) SARS-CoV-2 target nucleic acids are NOT DETECTED.  The SARS-CoV-2 RNA is generally detectable in upper respiratory specimens during the acute phase of infection. The lowest concentration of SARS-CoV-2 viral copies this assay can detect is 138 copies/mL. A negative result does not preclude SARS-Cov-2 infection and should not be used as the sole basis for treatment or other patient management decisions. A negative result may occur with  improper specimen collection/handling, submission of specimen other than nasopharyngeal swab, presence of viral mutation(s) within the areas targeted by this assay, and inadequate number of viral copies(<138 copies/mL). A negative result must be combined with clinical observations, patient history, and epidemiological information. The expected result is Negative.  Fact Sheet for Patients:  EntrepreneurPulse.com.au  Fact Sheet for Healthcare Providers:   IncredibleEmployment.be  This test is no t yet approved or cleared by the [place] FDA and  has been authorized for detection and/or diagnosis of SARS-CoV-2 by FDA under an Emergency Use Authorization (EUA). This EUA will remain  in effect (meaning this test can be used) for the duration of the COVID-19 declaration under Section 564(b)(1) of the Act, 21 U.S.C.section 360bbb-3(b)(1), unless the authorization is terminated  or revoked sooner.       Influenza A by PCR NEGATIVE NEGATIVE Final   Influenza B by PCR NEGATIVE NEGATIVE Final    Comment: (NOTE) The Xpert Xpress SARS-CoV-2/FLU/RSV plus assay is intended as an aid in the diagnosis of influenza from Nasopharyngeal swab specimens and should not be used as a sole basis for treatment. Nasal washings and aspirates are unacceptable for Xpert Xpress SARS-CoV-2/FLU/RSV testing.  Fact Sheet for Patients: EntrepreneurPulse.com.au  Fact Sheet for Healthcare Providers: IncredibleEmployment.be  This test is not yet approved or cleared by the [place] FDA and has been authorized for detection and/or diagnosis of SARS-CoV-2 by FDA under an Emergency Use Authorization (EUA). This EUA will remain in effect (meaning this test can be used) for the duration of the COVID-19 declaration under Section 564(b)(1) of the Act, 21 U.S.C. section 360bbb-3(b)(1), unless the authorization is terminated or revoked.  Performed at [facility], [place] [address] [place], [place] [address]   Culture, blood (x 2)     Status: Abnormal   Collection  Time: 03/15/21  2:40 AM   Specimen: BLOOD LEFT WRIST  Result Value Ref Range Status   Specimen Description   Final    BLOOD LEFT WRIST Performed at [facility], [address], [place], [place] [address]    Special Requests   Final    BOTTLES DRAWN AEROBIC AND ANAEROBIC Blood Culture adequate volume Performed at [place] [address], [place], [place] [address]    Culture  Setup Time   Final    GRAM POSITIVE COCCI IN CLUSTERS IN BOTH AEROBIC AND ANAEROBIC BOTTLES Organism ID to follow CRITICAL RESULT CALLED TO, READ BACK BY AND VERIFIED WITH: PHARMD L POINDEXTER [id number] MLM    Culture (A)  Final    STAPHYLOCOCCUS HAEMOLYTICUS THE SIGNIFICANCE OF ISOLATING THIS ORGANISM FROM A SINGLE SET OF BLOOD CULTURES WHEN MULTIPLE SETS ARE DRAWN IS UNCERTAIN. PLEASE NOTIFY THE MICROBIOLOGY DEPARTMENT WITHIN ONE WEEK IF SPECIATION AND SENSITIVITIES ARE REQUIRED. Performed at [facility], [place] [address], [place], [place] [address]    Report Status 03/17/2021 FINAL  Final  Blood Culture ID Panel (Reflexed)     Status: Abnormal   Collection Time: 03/15/21  2:40 AM  Result Value Ref Range Status   Enterococcus faecalis NOT DETECTED NOT DETECTED Final   Enterococcus Faecium NOT DETECTED NOT DETECTED Final   Listeria monocytogenes NOT DETECTED NOT DETECTED Final   Staphylococcus species DETECTED (A) NOT DETECTED Final    Comment: CRITICAL RESULT CALLED TO, READ BACK BY AND VERIFIED WITH: Yevette Edwards [id number] MLM    Staphylococcus aureus (BCID) NOT DETECTED NOT DETECTED Final   Staphylococcus epidermidis NOT DETECTED NOT DETECTED Final   Staphylococcus lugdunensis NOT DETECTED NOT DETECTED Final   Streptococcus species NOT DETECTED NOT DETECTED Final   Streptococcus agalactiae NOT DETECTED NOT DETECTED Final   Streptococcus pneumoniae NOT DETECTED NOT DETECTED Final   Streptococcus pyogenes NOT DETECTED NOT DETECTED Final   A.calcoaceticus-baumannii NOT DETECTED NOT DETECTED Final   Bacteroides fragilis NOT DETECTED NOT DETECTED Final   Enterobacterales NOT DETECTED NOT DETECTED Final   Enterobacter cloacae complex NOT DETECTED NOT DETECTED Final   Escherichia coli NOT DETECTED NOT DETECTED Final   Klebsiella aerogenes NOT DETECTED NOT DETECTED Final   Klebsiella  oxytoca NOT DETECTED NOT DETECTED Final   Klebsiella pneumoniae NOT DETECTED NOT DETECTED Final   Proteus species NOT DETECTED NOT DETECTED Final   Salmonella species NOT DETECTED NOT DETECTED Final   Serratia marcescens NOT DETECTED NOT DETECTED Final   Haemophilus influenzae NOT DETECTED NOT DETECTED Final   Neisseria meningitidis NOT DETECTED NOT DETECTED Final   Pseudomonas aeruginosa NOT DETECTED NOT DETECTED Final   Stenotrophomonas maltophilia NOT DETECTED NOT DETECTED Final   Candida albicans NOT DETECTED NOT DETECTED Final   Candida auris NOT DETECTED NOT DETECTED Final   Candida glabrata NOT DETECTED NOT DETECTED Final   Candida krusei NOT DETECTED NOT DETECTED Final   Candida parapsilosis NOT DETECTED NOT DETECTED Final   Candida tropicalis NOT DETECTED NOT DETECTED Final   Cryptococcus neoformans/gattii NOT DETECTED NOT DETECTED Final    Comment: Performed at [facility], [address], [place], [place] [address]  Culture, blood (x 2)     Status: None (Preliminary result)   Collection Time: 03/15/21  2:44 PM   Specimen: BLOOD LEFT FOREARM  Result Value Ref Range Status   Specimen Description   Final    BLOOD LEFT FOREARM Performed at [facility]  [place] [address] [place], [place] [address]    Special Requests   Final    BOTTLES DRAWN AEROBIC AND ANAEROBIC Blood Culture adequate volume Performed at [place] [address], [place], [place] [address]    Culture   Final    NO GROWTH 2 DAYS Performed at [place] [address] [place], [place] [address]    Report Status PENDING  Incomplete         Radiology Studies: No results found.       Scheduled Meds:  aspirin EC  81 mg Oral QHS   cefdinir  300 mg Oral Q12H   heparin  5,000 Units Subcutaneous Q8H   insulin aspart  0-5 Units Subcutaneous QHS   insulin aspart  0-9 Units Subcutaneous TID WC   magnesium oxide  400 mg Oral Daily   pantoprazole   40 mg Oral Daily   potassium chloride  40 mEq Oral BID   potassium chloride  40 mEq Oral Once   QUEtiapine  25 mg Oral QHS   sertraline  25 mg Oral Daily   simvastatin  40 mg Oral QHS   thiamine  100 mg Oral Daily   traZODone  50 mg Oral QHS   Zinc Oxide   Topical TID   Continuous Infusions:     LOS: 3 days   Time spent: 34mins Greater than 50% of this time was spent in counseling, explanation of diagnosis, planning of further management, and coordination of care.   Voice Recognition Viviann Spare dictation system was used to create this note, attempts have been made to correct errors. Please contact the author with questions and/or clarifications.   Florencia Reasons, MD PhD FACP [facility]  Available via Epic secure chat 7am-7pm for nonurgent issues Please page for urgent issues To page the attending provider between 7A-7P or the covering provider during after hours 7P-7A, please log into the web site [email or web address] and access using universal  password for that web site. If you do not have the password, please call the hospital operator.    03/17/2021, 6:24 PM

## 2021-03-18 DIAGNOSIS — I5043 Acute on chronic combined systolic (congestive) and diastolic (congestive) heart failure: Secondary | ICD-10-CM

## 2021-03-18 LAB — BASIC METABOLIC PANEL
Anion gap: 9 (ref 5–15)
BUN: 11 mg/dL (ref 8–23)
CO2: 23 mmol/L (ref 22–32)
Calcium: 9.4 mg/dL (ref 8.9–10.3)
Chloride: 105 mmol/L (ref 98–111)
Creatinine, Ser: 0.73 mg/dL (ref 0.61–1.24)
GFR, Estimated: >60 mL/min (ref >60)
Glucose, Bld: 128 mg/dL — ABNORMAL HIGH (ref 70–99)
Potassium: 4.2 mmol/L (ref 3.5–5.1)
Sodium: 137 mmol/L (ref 135–145)

## 2021-03-18 LAB — GLUCOSE, CAPILLARY
Glucose-Capillary: 129 mg/dL — ABNORMAL HIGH (ref 70–99)
Glucose-Capillary: 155 mg/dL — ABNORMAL HIGH (ref 70–99)

## 2021-03-18 LAB — MAGNESIUM: Magnesium: 1.5 mg/dL — ABNORMAL LOW (ref 1.7–2.4)

## 2021-03-18 MED ORDER — MAG-OXIDE 200 MG PO TABS: 200.0000 mg | ORAL_TABLET | Freq: Every day | ORAL | 0 refills | Status: AC

## 2021-03-18 MED ORDER — QUETIAPINE FUMARATE 25 MG PO TABS: 25.0000 mg | ORAL_TABLET | Freq: Every day | ORAL | Status: DC

## 2021-03-18 MED ORDER — RESOURCE THICKENUP CLEAR PO POWD: 4.0000 g | ORAL | Status: DC

## 2021-03-18 MED ORDER — DOCUSATE SODIUM 100 MG PO CAPS
200.0000 mg | ORAL_CAPSULE | Freq: Two times a day (BID) | ORAL | Status: DC | PRN
Start: 2021-03-18 — End: 2021-04-22

## 2021-03-18 MED ORDER — CEFDINIR 300 MG PO CAPS: 300.0000 mg | ORAL_CAPSULE | Freq: Two times a day (BID) | ORAL | 0 refills | Status: AC

## 2021-03-18 MED ORDER — ZINC OXIDE 12.8 % EX OINT: TOPICAL_OINTMENT | Freq: Three times a day (TID) | CUTANEOUS | 0 refills | Status: DC

## 2021-03-18 MED ORDER — FUROSEMIDE 40 MG PO TABS: 40.0000 mg | ORAL_TABLET | Freq: Every day | ORAL | 3 refills | Status: DC | PRN

## 2021-03-18 MED ORDER — ASPIRIN EC 81 MG PO TBEC: 81.0000 mg | DELAYED_RELEASE_TABLET | Freq: Every day | ORAL | Status: AC

## 2021-03-18 MED ORDER — TAMSULOSIN HCL 0.4 MG PO CAPS: 0.4000 mg | ORAL_CAPSULE | Freq: Every day | ORAL | 1 refills | Status: AC

## 2021-03-18 NOTE — TOC Transition Note (Signed)
Transition of Care [facility]) - CM/SW Discharge Note   Patient Details  Name: Vincent [name] MRN: [id number] Date of Birth: 07-01-1951  Transition of [facility]) CM/SW Contact:  Trish Mage, LCSW Phone Number: 03/18/2021, 12:07 PM   Clinical Narrative:   Patient who is stable for d/c, nonambulatory, will be be returning home via PTAR.  Spoke with wife, who states they will try to use his bed at home, and if that doesn't work she will call [facility] to ask for the hospital bed.  She will ask [place] for help if needed.  I instructed her to call [facility] IR on Monday to find out about ambulance transportation for his appointment on Tuesday. Also, gave her instructions for calling [facility] if she wants to use home visit MD for PCP. No further needs identified. PTAR arranged. TOC sign off.    Final next level of care: Home w Home Health Services Barriers to Discharge: No Barriers Identified   Patient Goals and CMS Choice     Choice offered to / list presented to : Spouse  Discharge Placement                       Discharge Plan and Services   Discharge Planning Services: CM Consult Post Acute Care Choice: Home Health                               Social Determinants of Health (SDOH) Interventions     Readmission Risk Interventions No flowsheet data found.

## 2021-03-18 NOTE — Discharge Summary (Signed)
Discharge Summary  Vincent [name] FWY:[id number] DOB: Sep 15, 1951  PCP: Ginger Organ., MD  Admit date: 03/14/2021 Discharge date: 03/18/2021  Time spent: 54mins, more than 50% time spent on coordination of care.   Recommendations for Outpatient Follow-up:  F/u with PCP within a week  for hospital discharge follow up, repeat cbc/bmp at follow up.  Social worker is working on Company secretary for patient to go to outpatient clinical follow-up, as patient now is bedbound, may need doctors making house calls if not able to secure transportation, Education officer, museum made aware, wife made aware F/u with IR for gallbladder drain care/repeat imaging scheduled on 6/14 at [facility] interventional radiology Follow-up with general surgery Dr. Ninfa Linden for gallbladder issues Continue palliative care Wife declined skilled nursing facility placement, requested hospital bed home health services, she declined PT as patient has been nonambulatory since after stroke in April     Discharge Diagnoses:  Active Hospital Problems   Diagnosis Date Noted   UTI (urinary tract infection) 03/14/2021   AKI (acute kidney injury) ([place]) 03/14/2021   Hypokalemia 03/14/2021   Depression    Anxiety    Severe sepsis ([place]) 02/06/2021   Chronic cholecystitis 02/06/2021   Diabetes 1.5, managed as type 2 ([place]) 27/74/1287   Chronic systolic CHF (congestive heart failure) ([place]) 01/27/2021   CVA (cerebral vascular accident) ([place]) 01/12/2021    Resolved Hospital Problems  No resolved problems to display.    Discharge Condition: stable  Diet recommendation: Soft diet/ thickened liquid  Filed Weights   03/14/21 1511  Weight: 91.3 kg    History of present illness: (Per admitting MD Dr. Myna Hidalgo) Patient coming from: Home    Chief Complaint: Not eating or drinking much, increased confusion, slurred speech    HPI: Vincent [name] is a 70 y.o. male with medical history significant for CAD, chronic combined  systolic and diastolic CHF, diabetes mellitus, depression, anxiety, recent cholecystitis with percutaneous drain, now presenting to emergency department for evaluation of anorexia, increased confusion, and dysarthria.  Patient has been back home with his wife from a nursing facility for 1 to 2 weeks now and has not been eating much or drinking despite her encouragement, and then was noted to have increased confusion and dysarthria beginning last night.  He was complaining of some right knee pain after a minor fall but no other complaints.  He did not hit his head or suffer any other apparent injuries with the fall.  There has not been any vomiting or diarrhea.  Patient's wife manages the cholecystostomy which she drains daily and has not noticed any change in the character or volume of output.  Patient has not appeared to be short of breath.  He has continued to take his medications including Lasix daily.   ED Course: Upon arrival to the ED, patient is found to be afebrile, saturating well on room air, mildly tachypneic, tachycardic to 120s, and with blood pressure 105/69.  EKG features sinus tachycardia.  Head CT negative for acute intracranial abnormality.  Chest x-ray and cardiopulmonary disease.  Notable for potassium 3.3, bicarbonate 19, and creatinine 1.33.  CBC features a leukocytosis to 15,300.  Patient was given a liter of saline and 1 g IV Rocephin in the ED.  Hospital Course:  Principal Problem:   UTI (urinary tract infection) Active Problems:   CVA (cerebral vascular accident) ([place])   Diabetes 1.5, managed as type 2 ([place])   Chronic systolic CHF (congestive heart failure) ([facility])   Chronic  cholecystitis   Severe sepsis ([facility])   AKI (acute kidney injury) ([place])   Hypokalemia   Depression   Anxiety  Sepsis likely secondary to UTI, present on admission -Patient presented with tachycardia, tachypnea, leukocytosis, AKI, acute metabolic encephalopathy -urine culture grew E. coli , resistant to  ampicillin, Unasyn, Bactrim, sensitive to cefazolin, Cipro, Zosyn -blood culture + staph heamolyticus on one sets of blood culture, likely contamination - chest x-ray no acute findings -Patient does has risk factor of UTI with history of BPH ,also on Jardiance, bedbound status , recent h/o cva, possible component of neurogenic bladder  -received Rocephin, changed to oral omnicef to finish total of 7 days treatment, hold Jardiance   Acute metabolic encephalopathy -CT head no acute findings, ammonia level unremarkable -likely due to sepsis, monitor -improved, now likely baseline dysarthria/confusion, he is aaox3, very poor historian, does not appear to have good insight    AKI Creatinine baseline 0.46 BUN 33/creatinine 1.33 on presentation, he received hydration , Lasix and metformin held in the hospital Improved, BUN 13 /creatinine 0.67  Appear to have poor oral intake, push fluids, change Lasix to daily as needed at discharge    Hyponatremia Likely due to dehydration due to poor oral intake and continued Lasix use, plan to discharge home Lasix as needed instead of daily Sodium improved after hydration , off hydration, encourage oral intake   Hypokalemia/hypomagnesemia  Required aggressive supplement, potassium has normalized, magnesium remain low, he declined IV placement, continue magnesium supplement orally  He was recently hospitalized for acute on chronic cholecystitis and underwent  Percutaneous drain placed by IR on 02/08/21 and 14 days of antibiotics were completed - Surgery recommended keeping drain in at least 6 weeks before possible cholecystectomy  - Continue drain care  Follow-up with IR and general surgery     Chronic combined CHF, h/o cad Present with dehydration Last EF 40 to 45% in April 2022 Hold Lasix, has poor oral intake,  change Lasix to as needed basis at discharge Hold Jardiance due to UTI/sepsis   CVA in 01/2021 with dysarthria and poor memory, but aaox3, he  appears to have poor insight  Has been bedbound since after the stroke    Non-insulin-dependent type 2 diabetes, controlled  A1c 7.2 in 01/2021 metformin held in the hospital, resumed at discharge   Hold Jardiance in the setting of UTI A.m. blood glucose 125-128   BPH continue Flomax   Right knee pain after fall at home Right knee x-ray:Moderate tricompartmental osteoarthritis, most prominent in the medial tibiofemoral compartment   FTT/goals of care discussion: Patient has been back home with his wife from a nursing facility for 1 to 2 weeks now and has not been eating much or drinking despite her encouragement, and then was noted to have increased confusion and dysarthria beginning the night before this admission Right knee pain after a fall at home Pt eval , wife does not want to go to skilled nursing facility, she requested home health RN/aids, she declined PT as patient has been bed bound since after the stroke in 01/2021, she requested hospital bed ,appreciate transitional care input Wife reports patient has been bedbound since after the stroke in 01/2021, has poor oral intake, 50lbs weight loss in the last two months,  Patient was seen by palliative care before hospitalization , he is to continue follow palliative care Patient will need ambulance transport to doctor's appointment due to bedbound status, discussed with social worker May benefit from Doctor making house call,  but he will need to see IR for drain care unless he is to transition to hospice in the near future.   Sacral skin tear, moisture related Wound care order placed, input appreciated    Body mass index is 30.6 kg/m.Marland Kitchen    DVT prophylaxis: heparin injection 5,000 Units Start: 03/15/21 0600     Code Status: DNR Family Communication: Wife over the phone on 6/10 Disposition: Home with home health     Consultants:  Wound care   Procedures:  None   Antimicrobials:   Anti-infectives (From admission, onward)     Start     Dose/Rate Route Frequency Ordered Stop   03/18/21 0000  cefdinir (OMNICEF) 300 MG capsule        300 mg Oral Every 12 hours 03/18/21 1009 03/22/21 2359   03/17/21 2200  cefdinir (OMNICEF) capsule 300 mg        300 mg Oral Every 12 hours 03/17/21 1639     03/17/21 0600  vancomycin (VANCOREADY) IVPB 1500 mg/300 mL  Status:  Discontinued        1,500 mg 150 mL/hr over 120 Minutes Intravenous Every 24 hours 03/15/21 2143 03/16/21 1206   03/16/21 2330  cefdinir (OMNICEF) capsule 300 mg        300 mg Oral  Once 03/16/21 2239 03/16/21 2315   03/15/21 2230  vancomycin (VANCOREADY) IVPB 1750 mg/350 mL        1,750 mg 175 mL/hr over 120 Minutes Intravenous  Once 03/15/21 2141 03/16/21 0152   03/15/21 2200  cefTRIAXone (ROCEPHIN) 1 g in sodium chloride 0.9 % 100 mL IVPB  Status:  Discontinued        1 g 200 mL/hr over 30 Minutes Intravenous Every 24 hours 03/14/21 2327 03/17/21 1639   03/14/21 2115  cefTRIAXone (ROCEPHIN) 1 g in sodium chloride 0.9 % 100 mL IVPB        1 g 200 mL/hr over 30 Minutes Intravenous  Once 03/14/21 2102 03/15/21 0048         Discharge Exam: BP 113/77 (BP Location: Left Arm)   Pulse (!) 105   Temp 98.4 F (36.9 C) (Oral)   Resp 16   Ht 5\' 8"  (1.727 m)   Wt 91.3 kg   SpO2 98%   BMI 30.60 kg/m   General: Frail, chronically ill-appearing, baseline dysarthria, AAOx3, impaired memory, poor insight, gallbladder drain present Cardiovascular: RRR Respiratory: Normal respiratory effort  Discharge Instructions You were cared for by a hospitalist during your hospital stay. If you have any questions about your discharge medications or the care you received while you were in the hospital after you are discharged, you can call the unit and asked to speak with the hospitalist on call if the hospitalist that took care of you is not available. Once you are discharged, your primary care physician will handle any further medical issues. Please note that NO REFILLS  for any discharge medications will be authorized once you are discharged, as it is imperative that you return to your primary care physician (or establish a relationship with a primary care physician if you do not have one) for your aftercare needs so that they can reassess your need for medications and monitor your lab values.  Discharge Instructions     Diet general   Complete by: As directed    Soft diet   Increase activity slowly   Complete by: As directed    No wound care   Complete by: As directed  Continue drain care      Allergies as of 03/18/2021       Reactions   Penicillins Other (See Comments)   Disputed in 2022- Has tolerated cephalexin in the past  Has patient had a PCN reaction causing immediate rash, facial/tongue/throat swelling, SOB or lightheadedness with hypotension: Unknown Has patient had a PCN reaction causing severe rash involving mucus membranes or skin necrosis: Unknown Has patient had a PCN reaction that required hospitalization: Unknown Has patient had a PCN reaction occurring within the last 10 years: No        Medication List     STOP taking these medications    Jardiance 25 MG Tabs tablet Generic drug: empagliflozin   multivitamin with minerals Tabs tablet   sertraline 25 MG tablet Commonly known as: ZOLOFT       TAKE these medications    acetaminophen 500 MG tablet Commonly known as: TYLENOL Take 1,000 mg by mouth every 6 (six) hours as needed for mild pain, fever or headache.   aspirin EC 81 MG tablet Take 1 tablet (81 mg total) by mouth at bedtime.   cefdinir 300 MG capsule Commonly known as: OMNICEF Take 1 capsule (300 mg total) by mouth every 12 (twelve) hours for 4 days.   docusate sodium 100 MG capsule Commonly known as: COLACE Take 2 capsules (200 mg total) by mouth 2 (two) times daily as needed for mild constipation.   furosemide 40 MG tablet Commonly known as: LASIX Take 1 tablet (40 mg total) by mouth daily as  needed for fluid or edema. What changed:  when to take this reasons to take this   LORazepam 1 MG tablet Commonly known as: ATIVAN Take 1 mg by mouth every 8 (eight) hours as needed for anxiety.   Mag-Oxide 200 MG Tabs Generic drug: Magnesium Oxide Take 1 tablet (200 mg total) by mouth daily at 12 noon.   metFORMIN 1000 MG tablet Commonly known as: GLUCOPHAGE Take 1,000 mg by mouth 2 (two) times daily.   pantoprazole 40 MG tablet Commonly known as: PROTONIX Take 1 tablet (40 mg total) by mouth daily.   QUEtiapine 25 MG tablet Commonly known as: SEROQUEL Take 1 tablet (25 mg total) by mouth at bedtime.   Resource ThickenUp Clear Powd Take 4 g by mouth See admin instructions. Use with every meals.   simvastatin 40 MG tablet Commonly known as: ZOCOR Take 1 tablet (40 mg total) by mouth at bedtime.   tamsulosin 0.4 MG Caps capsule Commonly known as: FLOMAX Take 1 capsule (0.4 mg total) by mouth daily.   thiamine 100 MG tablet Take 1 tablet (100 mg total) by mouth daily.   traZODone 50 MG tablet Commonly known as: DESYREL Take 50 mg by mouth at bedtime.   vitamin B-12 1000 MCG tablet Commonly known as: CYANOCOBALAMIN Take 1 tablet (1,000 mcg total) by mouth daily.   Zinc Oxide 12.8 % ointment Commonly known as: TRIPLE PASTE Apply topically 3 (three) times daily.               Durable Medical Equipment  (From admission, onward)           Start     Ordered   03/16/21 1551  For home use only DME Hospital bed  Once       Question Answer Comment  Length of Need Lifetime   The above medical condition requires: Patient requires the ability to reposition frequently   Head must be elevated greater than:  30 degrees   Bed type Semi-electric      03/16/21 1550           Allergies  Allergen Reactions   Penicillins Other (See Comments)    Disputed in 2022- Has tolerated cephalexin in the past  Has patient had a PCN reaction causing immediate rash,  facial/tongue/throat swelling, SOB or lightheadedness with hypotension: Unknown Has patient had a PCN reaction causing severe rash involving mucus membranes or skin necrosis: Unknown Has patient had a PCN reaction that required hospitalization: Unknown Has patient had a PCN reaction occurring within the last 10 years: No      Follow-up Information     [facility] Follow up.   Specialty: Home Health Services Why: This is the home health agency that will be providing RN services. Contact information: [address] [place] [address] [phone]         Ginger Organ., MD Follow up in 2 day(s).   Specialty: Internal Medicine Why: social worker working on finding transportation assistance, Education officer, museum can provide doctor making house call info if not able to find [employer] information: [place] [address] [phone]         Coralie Keens, MD Follow up.   Specialty: General Surgery Why: for gallbladder Contact information: [address] [place] [address] [phone]         [facility] Follow up.   Why: This is the custodial care agency that I had called for you to work with.  Folloiw up with them on Monday Contact information: [address] [place], [place], [address]  [phone]                 The results of significant diagnostics from this hospitalization (including imaging, microbiology, ancillary and laboratory) are listed below for reference.    Significant Diagnostic Studies: DG Chest 2 View  Result Date: 03/14/2021 CLINICAL DATA:  Generalized weakness EXAM: CHEST - 2 VIEW COMPARISON:  02/06/2021 chest radiograph. FINDINGS: Stable cardiomediastinal silhouette with normal heart size. No pneumothorax. No pleural effusion. Lungs appear clear, with no acute consolidative airspace disease and no pulmonary edema. IMPRESSION: No active  cardiopulmonary disease. Electronically Signed   By: Ilona Sorrel M.D.   On: 03/14/2021 19:20   CT Head Wo Contrast  Result Date: 03/14/2021 CLINICAL DATA:  Delirium. Generalized weakness. Progressive confusion and slurring of speech. EXAM: CT HEAD WITHOUT CONTRAST TECHNIQUE: Contiguous axial images were obtained from the base of the skull through the vertex without intravenous contrast. COMPARISON:  CT 01/12/2021 FINDINGS: Brain: No evidence of acute infarction, hemorrhage, hydrocephalus, extra-axial collection or mass lesion/mass effect. There is moderate patchy low-attenuation within the subcortical and periventricular white matter compatible with chronic microvascular disease. Remote left cerebellar infarct appears unchanged. Vascular: No hyperdense vessel or unexpected calcification. Skull: Normal. Negative for fracture or focal lesion. Sinuses/Orbits: No acute finding. Other: None IMPRESSION: 1. No acute intracranial abnormalities. 2. Chronic small vessel ischemic disease and brain atrophy. Electronically Signed   By: Kerby Moors M.D.   On: 03/14/2021 19:03   DG Knee Complete 4 Views Left  Result Date: 03/14/2021 CLINICAL DATA:  Left knee pain. EXAM: LEFT KNEE - COMPLETE 4+ VIEW COMPARISON:  None. FINDINGS: No evidence of fracture, dislocation, or joint effusion. Medial tibiofemoral joint space narrowing. Moderate tricompartmental peripheral spurring. No erosion or bony destruction. There are vascular calcifications. IMPRESSION: Moderate tricompartmental osteoarthritis, most prominent in the  medial tibiofemoral compartment. Electronically Signed   By: Keith Rake M.D.   On: 03/14/2021 20:58    Microbiology: Recent Results (from the past 240 hour(s))  Urine culture     Status: Abnormal   Collection Time: 03/14/21  5:18 PM   Specimen: Urine, Clean Catch  Result Value Ref Range Status   Specimen Description   Final    URINE, CLEAN CATCH Performed at [facility], [place]  [address], [place], [place] [address]    Special Requests   Final    NONE Performed at [facility], [place] [address] [place], [place] [address]    Culture >=100,000 COLONIES/mL ESCHERICHIA COLI (A)  Final   Report Status 03/17/2021 FINAL  Final   Organism ID, Bacteria ESCHERICHIA COLI (A)  Final      Susceptibility   Escherichia coli - MIC*    AMPICILLIN >=32 RESISTANT Resistant     CEFAZOLIN <=4 SENSITIVE Sensitive     CEFEPIME <=0.12 SENSITIVE Sensitive     CEFTRIAXONE <=0.25 SENSITIVE Sensitive     CIPROFLOXACIN 1 SENSITIVE Sensitive     GENTAMICIN >=16 RESISTANT Resistant     IMIPENEM <=0.25 SENSITIVE Sensitive     NITROFURANTOIN <=16 SENSITIVE Sensitive     TRIMETH/SULFA >=320 RESISTANT Resistant     AMPICILLIN/SULBACTAM >=32 RESISTANT Resistant     PIP/TAZO <=4 SENSITIVE Sensitive     * >=100,000 COLONIES/mL ESCHERICHIA COLI  Resp Panel by RT-PCR (Flu A&B, Covid) Nasopharyngeal Swab     Status: None   Collection Time: 03/14/21  5:19 PM   Specimen: Nasopharyngeal Swab; Nasopharyngeal(NP) swabs in vial transport medium  Result Value Ref Range Status   SARS Coronavirus 2 by RT PCR NEGATIVE NEGATIVE Final    Comment: (NOTE) SARS-CoV-2 target nucleic acids are NOT DETECTED.  The SARS-CoV-2 RNA is generally detectable in upper respiratory specimens during the acute phase of infection. The lowest concentration of SARS-CoV-2 viral copies this assay can detect is 138 copies/mL. A negative result does not preclude SARS-Cov-2 infection and should not be used as the sole basis for treatment or other patient management decisions. A negative result may occur with  improper specimen collection/handling, submission of specimen other than nasopharyngeal swab, presence of viral mutation(s) within the areas targeted by this assay, and inadequate number of viral copies(<138 copies/mL). A negative result must be combined with clinical observations, patient history, and  epidemiological information. The expected result is Negative.  Fact Sheet for Patients:  EntrepreneurPulse.com.au  Fact Sheet for Healthcare Providers:  IncredibleEmployment.be  This test is no t yet approved or cleared by the [place] FDA and  has been authorized for detection and/or diagnosis of SARS-CoV-2 by FDA under an Emergency Use Authorization (EUA). This EUA will remain  in effect (meaning this test can be used) for the duration of the COVID-19 declaration under Section 564(b)(1) of the Act, 21 U.S.C.section 360bbb-3(b)(1), unless the authorization is terminated  or revoked sooner.       Influenza A by PCR NEGATIVE NEGATIVE Final   Influenza B by PCR NEGATIVE NEGATIVE Final    Comment: (NOTE) The Xpert Xpress SARS-CoV-2/FLU/RSV plus assay is intended as an aid in the diagnosis of influenza from Nasopharyngeal swab specimens and should not be used as a sole basis for treatment. Nasal washings and aspirates are unacceptable for Xpert Xpress SARS-CoV-2/FLU/RSV testing.  Fact Sheet for Patients: EntrepreneurPulse.com.au  Fact Sheet for Healthcare Providers: IncredibleEmployment.be  This test is not yet approved or cleared by the  [place] Architectural technologist and has been authorized for detection and/or diagnosis of SARS-CoV-2 by FDA under an Print production planner (EUA). This EUA will remain in effect (meaning this test can be used) for the duration of the COVID-19 declaration under Section 564(b)(1) of the Act, 21 U.S.C. section 360bbb-3(b)(1), unless the authorization is terminated or revoked.  Performed at [facility], [place] [address], [place], [place] [address]   Culture, blood (x 2)     Status: Abnormal   Collection Time: 03/15/21  2:40 AM   Specimen: BLOOD LEFT WRIST  Result Value Ref Range Status   Specimen Description   Final    BLOOD LEFT WRIST Performed at [facility], [address] [place], [place] [address]    Special Requests   Final    BOTTLES DRAWN AEROBIC AND ANAEROBIC Blood Culture adequate volume Performed at [place] [address], [place],  [address]    Culture  Setup Time   Final    GRAM POSITIVE COCCI IN CLUSTERS IN BOTH AEROBIC AND ANAEROBIC BOTTLES Organism ID to follow CRITICAL RESULT CALLED TO, READ BACK BY AND VERIFIED WITH: PHARMD L POINDEXTER [id number] MLM    Culture (A)  Final    STAPHYLOCOCCUS HAEMOLYTICUS THE SIGNIFICANCE OF ISOLATING THIS ORGANISM FROM A SINGLE SET OF BLOOD CULTURES WHEN MULTIPLE SETS ARE DRAWN IS UNCERTAIN. PLEASE NOTIFY THE MICROBIOLOGY DEPARTMENT WITHIN ONE WEEK IF SPECIATION AND SENSITIVITIES ARE REQUIRED. Performed at [facility], [place] [address] [place],  [address]    Report Status 03/17/2021 FINAL  Final  Blood Culture ID Panel (Reflexed)     Status: Abnormal   Collection Time: 03/15/21  2:40 AM  Result Value Ref Range Status   Enterococcus faecalis NOT DETECTED NOT DETECTED Final   Enterococcus Faecium NOT DETECTED NOT DETECTED Final   Listeria monocytogenes NOT DETECTED NOT DETECTED Final   Staphylococcus species DETECTED (A) NOT DETECTED Final    Comment: CRITICAL RESULT CALLED TO, READ BACK BY AND VERIFIED WITH: Yevette Edwards [id number] MLM    Staphylococcus aureus (BCID) NOT DETECTED NOT DETECTED Final   Staphylococcus epidermidis NOT DETECTED NOT DETECTED Final   Staphylococcus lugdunensis NOT DETECTED NOT DETECTED Final   Streptococcus species NOT DETECTED NOT DETECTED Final   Streptococcus agalactiae NOT DETECTED NOT DETECTED Final   Streptococcus pneumoniae NOT DETECTED NOT DETECTED Final   Streptococcus pyogenes NOT DETECTED NOT DETECTED Final   A.calcoaceticus-baumannii NOT DETECTED NOT DETECTED Final   Bacteroides fragilis NOT DETECTED NOT DETECTED Final   Enterobacterales NOT DETECTED NOT DETECTED Final    Enterobacter cloacae complex NOT DETECTED NOT DETECTED Final   Escherichia coli NOT DETECTED NOT DETECTED Final   Klebsiella aerogenes NOT DETECTED NOT DETECTED Final   Klebsiella oxytoca NOT DETECTED NOT DETECTED Final   Klebsiella pneumoniae NOT DETECTED NOT DETECTED Final   Proteus species NOT DETECTED NOT DETECTED Final   Salmonella species NOT DETECTED NOT DETECTED Final   Serratia marcescens NOT DETECTED NOT DETECTED Final   Haemophilus influenzae NOT DETECTED NOT DETECTED Final   Neisseria meningitidis NOT DETECTED NOT DETECTED Final   Pseudomonas aeruginosa NOT DETECTED NOT DETECTED Final   Stenotrophomonas maltophilia NOT DETECTED NOT DETECTED Final   Candida albicans NOT DETECTED NOT DETECTED Final   Candida auris NOT DETECTED NOT DETECTED Final   Candida glabrata NOT DETECTED NOT DETECTED Final   Candida krusei NOT DETECTED NOT DETECTED Final   Candida parapsilosis NOT DETECTED NOT  DETECTED Final   Candida tropicalis NOT DETECTED NOT DETECTED Final   Cryptococcus neoformans/gattii NOT DETECTED NOT DETECTED Final    Comment: Performed at [facility], [place] [address] [place], [place] [address]  Culture, blood (x 2)     Status: None (Preliminary result)   Collection Time: 03/15/21  2:44 PM   Specimen: BLOOD LEFT FOREARM  Result Value Ref Range Status   Specimen Description   Final    BLOOD LEFT FOREARM Performed at [facility] [address], [place], [place] [address]    Special Requests   Final    BOTTLES DRAWN AEROBIC AND ANAEROBIC Blood Culture adequate volume Performed at [facility] [address] [place], [place] [address]    Culture   Final    NO GROWTH 3 DAYS Performed at [facility], [place] [address], [place], [place] [address]    Report Status PENDING  Incomplete     Labs: Basic Metabolic Panel: Recent Labs  Lab 03/14/21 1718 03/15/21 0255 03/16/21 0827 03/17/21 0513 03/18/21 0602  NA 134* 130*  133* 136 137  K 3.3* 2.7* 2.9* 2.9* 4.2  CL 95* 97* 102 104 105  CO2 19* 19* 21* 23 23  GLUCOSE 138* 143* 132* 125* 128*  BUN 33* 29* 13 13 11   CREATININE 1.33* 1.24 0.77 0.67 0.73  CALCIUM 9.6 8.8* 8.8* 9.1 9.4  MG  --  1.7  --  1.7 1.5*   Liver Function Tests: Recent Labs  Lab 03/14/21 1718 03/15/21 0255 03/16/21 0827  AST 16 12* 12*  ALT 9 9 10   ALKPHOS 141* 119 115  BILITOT 1.1 0.8 0.5  PROT 7.7 6.7 6.3*  ALBUMIN 3.3* 3.0* 2.8*   Recent Labs  Lab 03/14/21 1718  LIPASE 23   Recent Labs  Lab 03/14/21 1718  AMMONIA 21   CBC: Recent Labs  Lab 03/14/21 1718 03/15/21 0255 03/16/21 0827 03/17/21 0513  WBC 15.3* 14.0* 9.3 8.3  NEUTROABS 12.2*  --   --   --   HGB 13.9 11.6* 11.1* 11.7*  HCT 42.4 34.3* 34.2* 35.9*  MCV 88.0 84.3 87.0 88.0  PLT 296 291 249 264   Cardiac Enzymes: No results for input(s): CKTOTAL, CKMB, CKMBINDEX, TROPONINI in the last 168 hours. BNP: BNP (last 3 results) Recent Labs    01/24/21 0353 01/25/21 0514 01/26/21 0238  BNP 242.2* 43.6 38.3    ProBNP (last 3 results) No results for input(s): PROBNP in the last 8760 hours.  CBG: Recent Labs  Lab 03/17/21 0724 03/17/21 1140 03/17/21 1751 03/17/21 2123 03/18/21 0740  GLUCAP 130* 148* 131* 120* 129*       Signed:  Florencia Reasons MD, PhD, FACP  [facility] 03/18/2021, 10:25 AM

## 2021-03-18 NOTE — Progress Notes (Signed)
Pt being discharged to home via [facility]. Discharge instructions and medication education provided to pt at bedside and spouse over the phone. Discharge instructions placed in packet for [facility].

## 2021-03-18 NOTE — Plan of Care (Signed)
  Problem: Nutrition: Goal: Adequate nutrition will be maintained Outcome: Progressing   Problem: Skin Integrity: Goal: Risk for impaired skin integrity will decrease Outcome: Progressing   Problem: Education: Goal: Knowledge of General Education information will improve Description: Including pain rating scale, medication(s)/side effects and non-pharmacologic comfort measures Outcome: Not Progressing   Problem: Health Behavior/Discharge Planning: Goal: Ability to manage health-related needs will improve Outcome: Not Progressing

## 2021-03-20 LAB — CULTURE, BLOOD (ROUTINE X 2)
Culture: NO GROWTH
Special Requests: ADEQUATE

## 2021-03-21 ENCOUNTER — Encounter (HOSPITAL_COMMUNITY): Payer: Self-pay

## 2021-03-21 ENCOUNTER — Ambulatory Visit (HOSPITAL_COMMUNITY): Admission: RE | Admit: 2021-03-21 | Payer: HMO | Source: Ambulatory Visit

## 2021-03-23 ENCOUNTER — Other Ambulatory Visit: Payer: Self-pay

## 2021-03-23 ENCOUNTER — Other Ambulatory Visit: Payer: HMO | Admitting: Hospice

## 2021-03-23 DIAGNOSIS — G4700 Insomnia, unspecified: Secondary | ICD-10-CM

## 2021-03-23 DIAGNOSIS — N3000 Acute cystitis without hematuria: Secondary | ICD-10-CM | POA: Diagnosis not present

## 2021-03-23 DIAGNOSIS — Z515 Encounter for palliative care: Secondary | ICD-10-CM

## 2021-03-23 NOTE — Progress Notes (Signed)
[facility] Consult Note Telephone: [phone]  Fax: [phone]  PATIENT NAME: Vincent [name] DOB: May 28, 1951 MRN: [id number]  PRIMARY CARE PROVIDER:   Ginger Organ., MD Ginger Organ., MD [place],  [place] [address]  REFERRING PROVIDER: Ginger Organ., MD Ginger Organ., MD [place],  [place] [address]  RESPONSIBLE PARTY: Self/spouse Contact Information     Name Relation Home Work [place] [phone]  [phone]       Visit is to build trust and highlight Palliative Medicine as specialized medical care for people living with serious illness, aimed at facilitating better quality of life through symptoms relief, assisting with advance care planning and complex medical decision making.   RECOMMENDATIONS/PLAN:   Advance Care Planning/Code Status: Patient is a DO NOT RESUSCITATE.  Goals of Care: Goals of care include to maximize quality of life and symptom management.  Palliative care team will continue to support patient, patient's family, and medical team.  Symptom management/Plan:  Urinary tract infection: Wife reports antibiotics completed yesterday; patient denies urinary symptoms. CHF: Spouse reports patient not taking any medications except Flomax.  Education provided on the need for furosemide.and other medications as ordered.  She verbalized understanding.  No added salt to heart healthy diet Right-sided hemiparesis related to recent stroke. Spouse not open to PT/OT.  Epic hospital discharge report shows spouse declined LTC placement for patient as well as  PT OT on discharge last week.  Insomnia: Continue sleep hygiene.  Use melatonin and trazodone.  Follow up: Palliative care will continue to follow for complex medical decision making, advance care planning, and clarification of goals. Return 6 weeks or prn. Encouraged to call provider sooner with  any concerns.  CHIEF COMPLAINT: Palliative follow up visit/urinary tract infection  HISTORY OF PRESENT ILLNESS:  Vincent [name] a 70 y.o. male with multiple medical problems including urinary tract infection; hospitalization 6/7-6/08/2021 for urinary tract infection.  Antibiotics completed yesterday.  Patient denied pain/discomfort/urinary symptoms.  Epic chart review last hospitalization indicates wife declined SNF placement for patient also declined physical therapy as patient has been nonambulatory since after stroke in April 2022.  She affirmed same today.  History of coronary atherosclerosis, stroke in April 2022, type 2 diabetes mellitus, combined systolic and diastolic congestive heart failure, chronic choleccystitis with IR drain - followed by Radiology. History obtained from review of EMR, discussion with primary team, family and/or patient. Records reviewed and summarized above. All 10 point systems reviewed and are negative except as documented in history of present illness above  Review and summarization of Epic records shows history from other than patient.   Palliative Care was asked to follow this patient o help address complex decision making in the context of advance care planning and goals of care clarification.   PPS: 30%  ROS General: NAD EYES: denies vision changes ENMT: denies dysphagia Cardiovascular: denies chest pain/discomfort Pulmonary: denies cough, denies SOB Abdomen: endorses good appetite, denies constipation/diarrhea GU: denies dysuria/urinary frequency MSK: Endorses weakness on right side,  no falls reported Skin: denies rashes or wounds Neurological: denies pain, denies insomnia Psych: Endorses positive mood Heme/lymph/immuno: denies bruises, abnormal bleeding  PHYSICAL EXAM  General: In no acute distress, appropriately dressed Cardiovascular: regular rate and rhythm; no edema in BLE Pulmonary: no cough, no increased work of breathing, normal respiratory  effort Abdomen: soft, non tender, no guarding, positive bowel sounds in all quadrants; IR  drain present, patent. GU:  no suprapubic tenderness Eyes: Normal lids, no discharge ENMT: Moist mucous membranes Musculoskeletal:  weakness-right side Skin: no rash to visible skin, warm without cyanosis,  Psych: non-anxious affect Neurological: Weakness but otherwise non focal Heme/lymph/immuno: no bruises, no bleeding  PERTINENT MEDICATIONS:  Outpatient Encounter Medications as of 03/23/2021  Medication Sig   acetaminophen (TYLENOL) 500 MG tablet Take 1,000 mg by mouth every 6 (six) hours as needed for mild pain, fever or headache.   aspirin EC 81 MG tablet Take 1 tablet (81 mg total) by mouth at bedtime.   docusate sodium (COLACE) 100 MG capsule Take 2 capsules (200 mg total) by mouth 2 (two) times daily as needed for mild constipation.   furosemide (LASIX) 40 MG tablet Take 1 tablet (40 mg total) by mouth daily as needed for fluid or edema.   LORazepam (ATIVAN) 1 MG tablet Take 1 mg by mouth every 8 (eight) hours as needed for anxiety.   Magnesium Oxide (MAG-OXIDE) 200 MG TABS Take 1 tablet (200 mg total) by mouth daily at 12 noon.   Maltodextrin-Xanthan Gum (RESOURCE THICKENUP CLEAR) POWD Take 4 g by mouth See admin instructions. Use with every meals.   metFORMIN (GLUCOPHAGE) 1000 MG tablet Take 1,000 mg by mouth 2 (two) times daily.   pantoprazole (PROTONIX) 40 MG tablet Take 1 tablet (40 mg total) by mouth daily.   QUEtiapine (SEROQUEL) 25 MG tablet Take 1 tablet (25 mg total) by mouth at bedtime.   simvastatin (ZOCOR) 40 MG tablet Take 1 tablet (40 mg total) by mouth at bedtime.   tamsulosin (FLOMAX) 0.4 MG CAPS capsule Take 1 capsule (0.4 mg total) by mouth daily.   thiamine 100 MG tablet Take 1 tablet (100 mg total) by mouth daily.   traZODone (DESYREL) 50 MG tablet Take 50 mg by mouth at bedtime.   vitamin B-12 (CYANOCOBALAMIN) 1000 MCG tablet Take 1 tablet (1,000 mcg total) by mouth  daily.   Zinc Oxide (TRIPLE PASTE) 12.8 % ointment Apply topically 3 (three) times daily.   No facility-administered encounter medications on file as of 03/23/2021.    HOSPICE ELIGIBILITY/DIAGNOSIS: TBD  PAST MEDICAL HISTORY:  Past Medical History:  Diagnosis Date   Acute MI ([facility])    Allergy    Anxiety    Arthritis    Coronary artery disease    Depression    Diabetes mellitus without complication (HCC)    Diverticulosis    mild   Hemorrhoids    HOH (hard of hearing)    Hx of colonic polyps    Hyperlipidemia    Hypertension    Left ventricular dysfunction    hx of     Review lab tests/diagnostics Recent Labs  Lab 03/17/21 0513  WBC 8.3  HGB 11.7*  HCT 35.9*  PLT 264  MCV 88.0   Recent Labs  Lab 03/17/21 0513 03/18/21 0602  NA 136 137  K 2.9* 4.2  CL 104 105  CO2 23 23  BUN 13 11  CREATININE 0.67 0.73  GLUCOSE 125* 128*    ALLERGIES:  Allergies  Allergen Reactions   Penicillins Other (See Comments)    Disputed in 2022- Has tolerated cephalexin in the past  Has patient had a PCN reaction causing immediate rash, facial/tongue/throat swelling, SOB or lightheadedness with hypotension: Unknown Has patient had a PCN reaction causing severe rash involving mucus membranes or skin necrosis: Unknown Has patient had a PCN reaction that required hospitalization: Unknown Has patient had a PCN  reaction occurring within the last 10 years: No        I spent 45 minutes providing this consultation; this includes time spent with patient/family, chart review and documentation. More than 50% of the time in this consultation was spent on counseling and coordinating communication   Thank you for the opportunity to participate in the care of Vincent [name] Please call our office at [phone] if we can be of additional assistance.  Note: Portions of this note were generated with [employer]. Dictation errors may occur despite best attempts at  proofreading.  Teodoro Spray, NP

## 2021-04-06 NOTE — Progress Notes (Deleted)
Cardiology Office Note   Date:  04/06/2021   ID:  DEEJAY KOPPELMAN, Vincent [name] [birth year], MRN [id number]  PCP:  Ginger Organ., MD  Cardiologist:  Dr. Angelena Form, MD   No chief complaint on file.   History of Present Illness: Vincent [name] is a 70 y.o. male who presents for    Vincent [name] He is a 70 y/o male with history of CAD, HTN, HLD who is here today for cardiac follow up. He has been followed in the past by Dr. Lia Foyer and now followed by Dr. Angelena Form. He had an anterior MI in 2007 with PCI to the LAD at that time. Last cath 06/23/10 per Dr. Lia Foyer with 30-40% proximal LAD restenosis, 30% mid LAD, 40% mid LCX, no RCA disease, LVEF noted to be 45-50%. Stress myoview 01/2014 with no ischemia. He was started on Imdur after being seen in the ED for chest pain with an essentially negative workup.   On last follow up he seemed to be doing well from a CV standpoint.   Unfortunately he was recently hospitalized for abdominal pain found to have cholecystitis with percutaneous drain placement 02/08/21 and 14 days of antibiotics, discharged 02/11/21. He represented one month later on 03/14/21 with anorexia, increased confusion, and dysarthria. He was treated with IVF and antibiotics for a UTI. Due to dysarthria, head CT was performed which was negative for acute findings with symptoms felt to be due to acute infection. He also was treated for AKI with a creatinine peak at 1.3 from 0.6.   Due to suspected dehydration, his Lasix and jardiance were held. He was seen by OP Palliative care 03/23/21. At that time, he was not taking any medications except his flomax. Family and patient were deferring PT/OT at that time and had also deferred LTC placement which was recommended at d/c.   Today he presents for follow up and         CAD status post anterior MI in 2007 treated with stent to the LAD, follow-up cath in 2011 and 11/21/2018 mild stent restenosis 40% unchanged, mild nonobstructive disease in the  circumflex and large dominant RCA, LVEF 40 to 50% with apical hypokinesis also unchanged. No angina.  Continue Imdur, metoprolol, ramipril   ICM compensated today on Lasix 40 mg twice a day.  Patient says he had blood work by PCP within the last 2 months.  We will try to get a copy of those.   Essential hypertension BP running high today but hasn't had meds yet. Says it's usually 120/80.   Hyperlipidemia LDL 67 11/2019 on Zocor   Recurrent UTI   Cholcyctitis with perc drain placement    Past Medical History:  Diagnosis Date   Acute MI ([facility])    Allergy    Anxiety    Arthritis    Coronary artery disease    Depression    Diabetes mellitus without complication ([facility])    Diverticulosis    mild   Hemorrhoids    HOH (hard of hearing)    Hx of colonic polyps    Hyperlipidemia    Hypertension    Left ventricular dysfunction    hx of    Past Surgical History:  Procedure Laterality Date   ANGIOPLASTY     and bare metal stent placement   ANKLE SURGERY Right 1986   i&d abscess- pt states "put a bone in it"   CORONARY ANGIOPLASTY WITH STENT PLACEMENT     IR PERC CHOLECYSTOSTOMY  02/07/2021   KNEE ARTHROPLASTY Right 08/09/2016   Procedure: RIGHT TOTAL KNEE ARTHROPLASTY WITH COMPUTER NAVIGATION;  Surgeon: Rod Can, MD;  Location: [facility];  Service: Orthopedics;  Laterality: Right;  Needs RNFA   LEFT HEART CATH AND CORONARY ANGIOGRAPHY N/A 11/21/2018   Procedure: LEFT HEART CATH AND CORONARY ANGIOGRAPHY;  Surgeon: Burnell Blanks, MD;  Location: [facility];  Service: Cardiovascular;  Laterality: N/A;     Current Outpatient Medications  Medication Sig Dispense Refill   acetaminophen (TYLENOL) 500 MG tablet Take 1,000 mg by mouth every 6 (six) hours as needed for mild pain, fever or headache.     aspirin EC 81 MG tablet Take 1 tablet (81 mg total) by mouth at bedtime.     docusate sodium (COLACE) 100 MG capsule Take 2 capsules (200 mg total) by mouth 2 (two) times  daily as needed for mild constipation.     furosemide (LASIX) 40 MG tablet Take 1 tablet (40 mg total) by mouth daily as needed for fluid or edema. 180 tablet 3   LORazepam (ATIVAN) 1 MG tablet Take 1 mg by mouth every 8 (eight) hours as needed for anxiety.     Magnesium Oxide (MAG-OXIDE) 200 MG TABS Take 1 tablet (200 mg total) by mouth daily at 12 noon. 30 tablet 0   Maltodextrin-Xanthan Gum (RESOURCE THICKENUP CLEAR) POWD Take 4 g by mouth See admin instructions. Use with every meals.     metFORMIN (GLUCOPHAGE) 1000 MG tablet Take 1,000 mg by mouth 2 (two) times daily.     pantoprazole (PROTONIX) 40 MG tablet Take 1 tablet (40 mg total) by mouth daily. 30 tablet 0   QUEtiapine (SEROQUEL) 25 MG tablet Take 1 tablet (25 mg total) by mouth at bedtime.     simvastatin (ZOCOR) 40 MG tablet Take 1 tablet (40 mg total) by mouth at bedtime. 90 tablet 3   tamsulosin (FLOMAX) 0.4 MG CAPS capsule Take 1 capsule (0.4 mg total) by mouth daily. 30 capsule 1   thiamine 100 MG tablet Take 1 tablet (100 mg total) by mouth daily. 30 tablet 1   traZODone (DESYREL) 50 MG tablet Take 50 mg by mouth at bedtime.     vitamin B-12 (CYANOCOBALAMIN) 1000 MCG tablet Take 1 tablet (1,000 mcg total) by mouth daily. 30 tablet 3   Zinc Oxide (TRIPLE PASTE) 12.8 % ointment Apply topically 3 (three) times daily. 56.7 g 0   No current facility-administered medications for this visit.    Allergies:   Penicillins    Social History:  The patient  reports that he quit smoking about 15 years ago. His smoking use included cigars. He quit smokeless tobacco use about 15 years ago. He reports that he does not drink alcohol and does not use drugs.   Family History:  The patient's family history includes Coronary artery disease in an other family member.    ROS:  Please see the history of present illness. Otherwise, review of systems are positive for none.   All other systems are reviewed and negative.    PHYSICAL EXAM: VS:   There were no vitals taken for this visit. , BMI There is no height or weight on file to calculate BMI.  General: Well developed, well nourished, NAD Skin: Warm, dry, intact  Head: Normocephalic, atraumatic, sclera non-icteric, no xanthomas, clear, moist mucus membranes. Neck: Negative for carotid bruits. No JVD Lungs:Clear to ausculation bilaterally. No wheezes, rales, or rhonchi. Breathing is unlabored. Cardiovascular: RRR with S1  S2. No murmurs, rubs, gallops, or LV heave appreciated. Abdomen: Soft, non-tender, non-distended with normoactive bowel sounds. No hepatomegaly, No rebound/guarding. No obvious abdominal masses. MSK: Strength and tone appear normal for age. 5/5 in all extremities Extremities: No edema. No clubbing or cyanosis. DP/PT pulses 2+ bilaterally Neuro: Alert and oriented. No focal deficits. No facial asymmetry. MAE spontaneously. Psych: Responds to questions appropriately with normal affect.      EKG:  EKG {ACTION; IS/IS WER:15400867} ordered today. The ekg ordered today demonstrates ***   Recent Labs: 01/23/2021: TSH 1.418 01/26/2021: B Natriuretic Peptide 38.3 03/16/2021: ALT 10 03/17/2021: Hemoglobin 11.7; Platelets 264 03/18/2021: BUN 11; Creatinine, Ser 0.73; Magnesium 1.5; Potassium 4.2; Sodium 137    Lipid Panel    Component Value Date/Time   CHOL 118 01/12/2021 2221   TRIG 61 01/12/2021 2221   HDL 46 01/12/2021 2221   CHOLHDL 2.6 01/12/2021 2221   VLDL 12 01/12/2021 2221   LDLCALC 60 01/12/2021 2221     Wt Readings from Last 3 Encounters:  03/14/21 201 lb 4.5 oz (91.3 kg)  02/11/21 201 lb 4.5 oz (91.3 kg)  01/17/21 207 lb 14.3 oz (94.3 kg)     Other studies Reviewed: Additional studies/ records that were reviewed today include: . Review of the above records demonstrates:   NST 06/2016 Study Highlights   Nuclear stress EF: 37%. There was no ST segment deviation noted during stress. Defect 1: There is a small defect of moderate severity present  in the apical anterior, apical lateral and apex location. Findings consistent with prior myocardial infarction. This is an intermediate risk study. The left ventricular ejection fraction is moderately decreased (30-44%).      Vascular US Aorta 11/21/17 Final Interpretation: Abdominal Aorta: No evidence of an abdominal aortic aneurysm was visualized. Right and left iliac arteries were not visualized.   *See table(s) above for measurements and observations.  ASSESSMENT AND PLAN:  1.  ***   Current medicines are reviewed at length with the patient today.  The patient {ACTIONS; HAS/DOES NOT HAVE:19233} concerns regarding medicines.  The following changes have been made:  {PLAN; NO CHANGE:13088:s}  Labs/ tests ordered today include: *** No orders of the defined types were placed in this encounter.    Disposition:   FU with *** in {gen number 6-19:509326} {Days to years:10300}  Signed, Kathyrn Drown, NP  04/06/2021 3:42 PM    [facility] [place], [place], [place]  [address] Phone: [phone]; Fax: [phone]

## 2021-04-11 ENCOUNTER — Ambulatory Visit: Payer: HMO | Admitting: Cardiology

## 2021-04-20 ENCOUNTER — Inpatient Hospital Stay (HOSPITAL_COMMUNITY)
Admission: EM | Admit: 2021-04-20 | Discharge: 2021-04-22 | DRG: 689 | Disposition: A | Discharge status: Home / Self Care | Payer: HMO | Attending: Family Medicine | Admitting: Family Medicine

## 2021-04-20 ENCOUNTER — Encounter (HOSPITAL_COMMUNITY): Payer: Self-pay | Admitting: Oncology

## 2021-04-20 ENCOUNTER — Inpatient Hospital Stay (HOSPITAL_COMMUNITY): Payer: HMO

## 2021-04-20 ENCOUNTER — Emergency Department (HOSPITAL_COMMUNITY): Payer: HMO

## 2021-04-20 ENCOUNTER — Other Ambulatory Visit: Payer: Self-pay

## 2021-04-20 DIAGNOSIS — K811 Chronic cholecystitis: Secondary | ICD-10-CM | POA: Diagnosis present

## 2021-04-20 DIAGNOSIS — G9341 Metabolic encephalopathy: Secondary | ICD-10-CM | POA: Diagnosis present

## 2021-04-20 DIAGNOSIS — L8990 Pressure ulcer of unspecified site, unspecified stage: Secondary | ICD-10-CM | POA: Insufficient documentation

## 2021-04-20 DIAGNOSIS — F32A Depression, unspecified: Secondary | ICD-10-CM | POA: Diagnosis not present

## 2021-04-20 DIAGNOSIS — R300 Dysuria: Secondary | ICD-10-CM | POA: Diagnosis not present

## 2021-04-20 DIAGNOSIS — N3000 Acute cystitis without hematuria: Secondary | ICD-10-CM

## 2021-04-20 DIAGNOSIS — E119 Type 2 diabetes mellitus without complications: Secondary | ICD-10-CM | POA: Diagnosis present

## 2021-04-20 DIAGNOSIS — I2510 Atherosclerotic heart disease of native coronary artery without angina pectoris: Secondary | ICD-10-CM | POA: Diagnosis not present

## 2021-04-20 DIAGNOSIS — N400 Enlarged prostate without lower urinary tract symptoms: Secondary | ICD-10-CM | POA: Diagnosis not present

## 2021-04-20 DIAGNOSIS — I69328 Other speech and language deficits following cerebral infarction: Secondary | ICD-10-CM | POA: Diagnosis not present

## 2021-04-20 DIAGNOSIS — I10 Essential (primary) hypertension: Secondary | ICD-10-CM | POA: Diagnosis not present

## 2021-04-20 DIAGNOSIS — G9340 Encephalopathy, unspecified: Secondary | ICD-10-CM | POA: Diagnosis not present

## 2021-04-20 DIAGNOSIS — L89152 Pressure ulcer of sacral region, stage 2: Secondary | ICD-10-CM | POA: Diagnosis present

## 2021-04-20 DIAGNOSIS — Z955 Presence of coronary angioplasty implant and graft: Secondary | ICD-10-CM

## 2021-04-20 DIAGNOSIS — E7800 Pure hypercholesterolemia, unspecified: Secondary | ICD-10-CM | POA: Diagnosis not present

## 2021-04-20 DIAGNOSIS — I110 Hypertensive heart disease with heart failure: Secondary | ICD-10-CM | POA: Diagnosis present

## 2021-04-20 DIAGNOSIS — Z79899 Other long term (current) drug therapy: Secondary | ICD-10-CM

## 2021-04-20 DIAGNOSIS — E440 Moderate protein-calorie malnutrition: Secondary | ICD-10-CM | POA: Diagnosis present

## 2021-04-20 DIAGNOSIS — E139 Other specified diabetes mellitus without complications: Secondary | ICD-10-CM | POA: Diagnosis not present

## 2021-04-20 DIAGNOSIS — R4182 Altered mental status, unspecified: Secondary | ICD-10-CM | POA: Diagnosis not present

## 2021-04-20 DIAGNOSIS — I5042 Chronic combined systolic (congestive) and diastolic (congestive) heart failure: Secondary | ICD-10-CM | POA: Diagnosis not present

## 2021-04-20 DIAGNOSIS — G4733 Obstructive sleep apnea (adult) (pediatric): Secondary | ICD-10-CM | POA: Diagnosis not present

## 2021-04-20 DIAGNOSIS — Z6828 Body mass index (BMI) 28.0-28.9, adult: Secondary | ICD-10-CM

## 2021-04-20 DIAGNOSIS — F419 Anxiety disorder, unspecified: Secondary | ICD-10-CM | POA: Diagnosis present

## 2021-04-20 DIAGNOSIS — R053 Chronic cough: Secondary | ICD-10-CM | POA: Diagnosis present

## 2021-04-20 DIAGNOSIS — Z87891 Personal history of nicotine dependence: Secondary | ICD-10-CM

## 2021-04-20 DIAGNOSIS — Z7401 Bed confinement status: Secondary | ICD-10-CM

## 2021-04-20 DIAGNOSIS — I69322 Dysarthria following cerebral infarction: Secondary | ICD-10-CM

## 2021-04-20 DIAGNOSIS — R410 Disorientation, unspecified: Secondary | ICD-10-CM | POA: Diagnosis not present

## 2021-04-20 DIAGNOSIS — Z7984 Long term (current) use of oral hypoglycemic drugs: Secondary | ICD-10-CM

## 2021-04-20 DIAGNOSIS — E872 Acidosis: Secondary | ICD-10-CM | POA: Diagnosis not present

## 2021-04-20 DIAGNOSIS — R6520 Severe sepsis without septic shock: Secondary | ICD-10-CM | POA: Diagnosis not present

## 2021-04-20 DIAGNOSIS — Z4682 Encounter for fitting and adjustment of non-vascular catheter: Secondary | ICD-10-CM | POA: Diagnosis not present

## 2021-04-20 DIAGNOSIS — Z86010 Personal history of colonic polyps: Secondary | ICD-10-CM

## 2021-04-20 DIAGNOSIS — R52 Pain, unspecified: Secondary | ICD-10-CM | POA: Diagnosis not present

## 2021-04-20 DIAGNOSIS — Z96651 Presence of right artificial knee joint: Secondary | ICD-10-CM | POA: Diagnosis not present

## 2021-04-20 DIAGNOSIS — E43 Unspecified severe protein-calorie malnutrition: Secondary | ICD-10-CM | POA: Diagnosis not present

## 2021-04-20 DIAGNOSIS — Z20822 Contact with and (suspected) exposure to covid-19: Secondary | ICD-10-CM | POA: Diagnosis present

## 2021-04-20 DIAGNOSIS — Z515 Encounter for palliative care: Secondary | ICD-10-CM

## 2021-04-20 DIAGNOSIS — R404 Transient alteration of awareness: Secondary | ICD-10-CM | POA: Diagnosis not present

## 2021-04-20 DIAGNOSIS — Z66 Do not resuscitate: Secondary | ICD-10-CM | POA: Diagnosis present

## 2021-04-20 DIAGNOSIS — N390 Urinary tract infection, site not specified: Principal | ICD-10-CM | POA: Diagnosis present

## 2021-04-20 DIAGNOSIS — R627 Adult failure to thrive: Secondary | ICD-10-CM | POA: Diagnosis present

## 2021-04-20 DIAGNOSIS — Z880 Allergy status to penicillin: Secondary | ICD-10-CM

## 2021-04-20 DIAGNOSIS — I5022 Chronic systolic (congestive) heart failure: Secondary | ICD-10-CM | POA: Diagnosis present

## 2021-04-20 DIAGNOSIS — E876 Hypokalemia: Secondary | ICD-10-CM | POA: Diagnosis not present

## 2021-04-20 DIAGNOSIS — E785 Hyperlipidemia, unspecified: Secondary | ICD-10-CM | POA: Diagnosis present

## 2021-04-20 DIAGNOSIS — Z8249 Family history of ischemic heart disease and other diseases of the circulatory system: Secondary | ICD-10-CM

## 2021-04-20 DIAGNOSIS — Z7982 Long term (current) use of aspirin: Secondary | ICD-10-CM

## 2021-04-20 DIAGNOSIS — Z87440 Personal history of urinary (tract) infections: Secondary | ICD-10-CM

## 2021-04-20 DIAGNOSIS — R000 Tachycardia, unspecified: Secondary | ICD-10-CM | POA: Diagnosis not present

## 2021-04-20 DIAGNOSIS — A419 Sepsis, unspecified organism: Secondary | ICD-10-CM | POA: Diagnosis not present

## 2021-04-20 LAB — CBC WITH DIFFERENTIAL/PLATELET
Abs Immature Granulocytes: 0.03 10*3/uL (ref 0.00–0.07)
Basophils Absolute: 0 10*3/uL (ref 0.0–0.1)
Basophils Relative: 1 %
Eosinophils Absolute: 0.1 10*3/uL (ref 0.0–0.5)
Eosinophils Relative: 1 %
HCT: 49.3 % (ref 39.0–52.0)
Hemoglobin: 15.6 g/dL (ref 13.0–17.0)
Immature Granulocytes: 0 %
Lymphocytes Relative: 17 %
Lymphs Abs: 1.4 10*3/uL (ref 0.7–4.0)
MCH: 28.3 pg (ref 26.0–34.0)
MCHC: 31.6 g/dL (ref 30.0–36.0)
MCV: 89.5 fL (ref 80.0–100.0)
Monocytes Absolute: 0.7 10*3/uL (ref 0.1–1.0)
Monocytes Relative: 9 %
Neutro Abs: 5.8 10*3/uL (ref 1.7–7.7)
Neutrophils Relative %: 72 %
Platelets: 234 10*3/uL (ref 150–400)
RBC: 5.51 MIL/uL (ref 4.22–5.81)
RDW: 16.4 % — ABNORMAL HIGH (ref 11.5–15.5)
WBC: 8 10*3/uL (ref 4.0–10.5)
nRBC: 0 % (ref 0.0–0.2)

## 2021-04-20 LAB — URINALYSIS, ROUTINE W REFLEX MICROSCOPIC
Bilirubin Urine: NEGATIVE
Glucose, UA: NEGATIVE mg/dL
Ketones, ur: NEGATIVE mg/dL
Nitrite: NEGATIVE
Protein, ur: NEGATIVE mg/dL
Specific Gravity, Urine: 1.005 (ref 1.005–1.030)
WBC, UA: >50 WBC/hpf — ABNORMAL HIGH (ref 0–5)
pH: 6 (ref 5.0–8.0)

## 2021-04-20 LAB — LACTIC ACID, PLASMA
Lactic Acid, Venous: 3.3 mmol/L (ref 0.5–1.9)
Lactic Acid, Venous: 3.5 mmol/L (ref 0.5–1.9)

## 2021-04-20 LAB — COMPREHENSIVE METABOLIC PANEL
ALT: 13 U/L (ref 0–44)
AST: 24 U/L (ref 15–41)
Albumin: 3.9 g/dL (ref 3.5–5.0)
Alkaline Phosphatase: 146 U/L — ABNORMAL HIGH (ref 38–126)
Anion gap: 14 (ref 5–15)
BUN: 8 mg/dL (ref 8–23)
CO2: 22 mmol/L (ref 22–32)
Calcium: 9.5 mg/dL (ref 8.9–10.3)
Chloride: 100 mmol/L (ref 98–111)
Creatinine, Ser: 0.61 mg/dL (ref 0.61–1.24)
GFR, Estimated: >60 mL/min (ref >60)
Glucose, Bld: 149 mg/dL — ABNORMAL HIGH (ref 70–99)
Potassium: 3.3 mmol/L — ABNORMAL LOW (ref 3.5–5.1)
Sodium: 136 mmol/L (ref 135–145)
Total Bilirubin: 0.6 mg/dL (ref 0.3–1.2)
Total Protein: 7.3 g/dL (ref 6.5–8.1)

## 2021-04-20 LAB — GLUCOSE, CAPILLARY: Glucose-Capillary: 109 mg/dL — ABNORMAL HIGH (ref 70–99)

## 2021-04-20 LAB — CBG MONITORING, ED: Glucose-Capillary: 84 mg/dL (ref 70–99)

## 2021-04-20 IMAGING — DX DG CHEST 1V PORT
1 series · 1 of 1 positions shown · non-contrast
Comparison: [DATE]

CLINICAL DATA: Altered mental status

EXAM:
PORTABLE CHEST 1 VIEW

[chest ap]
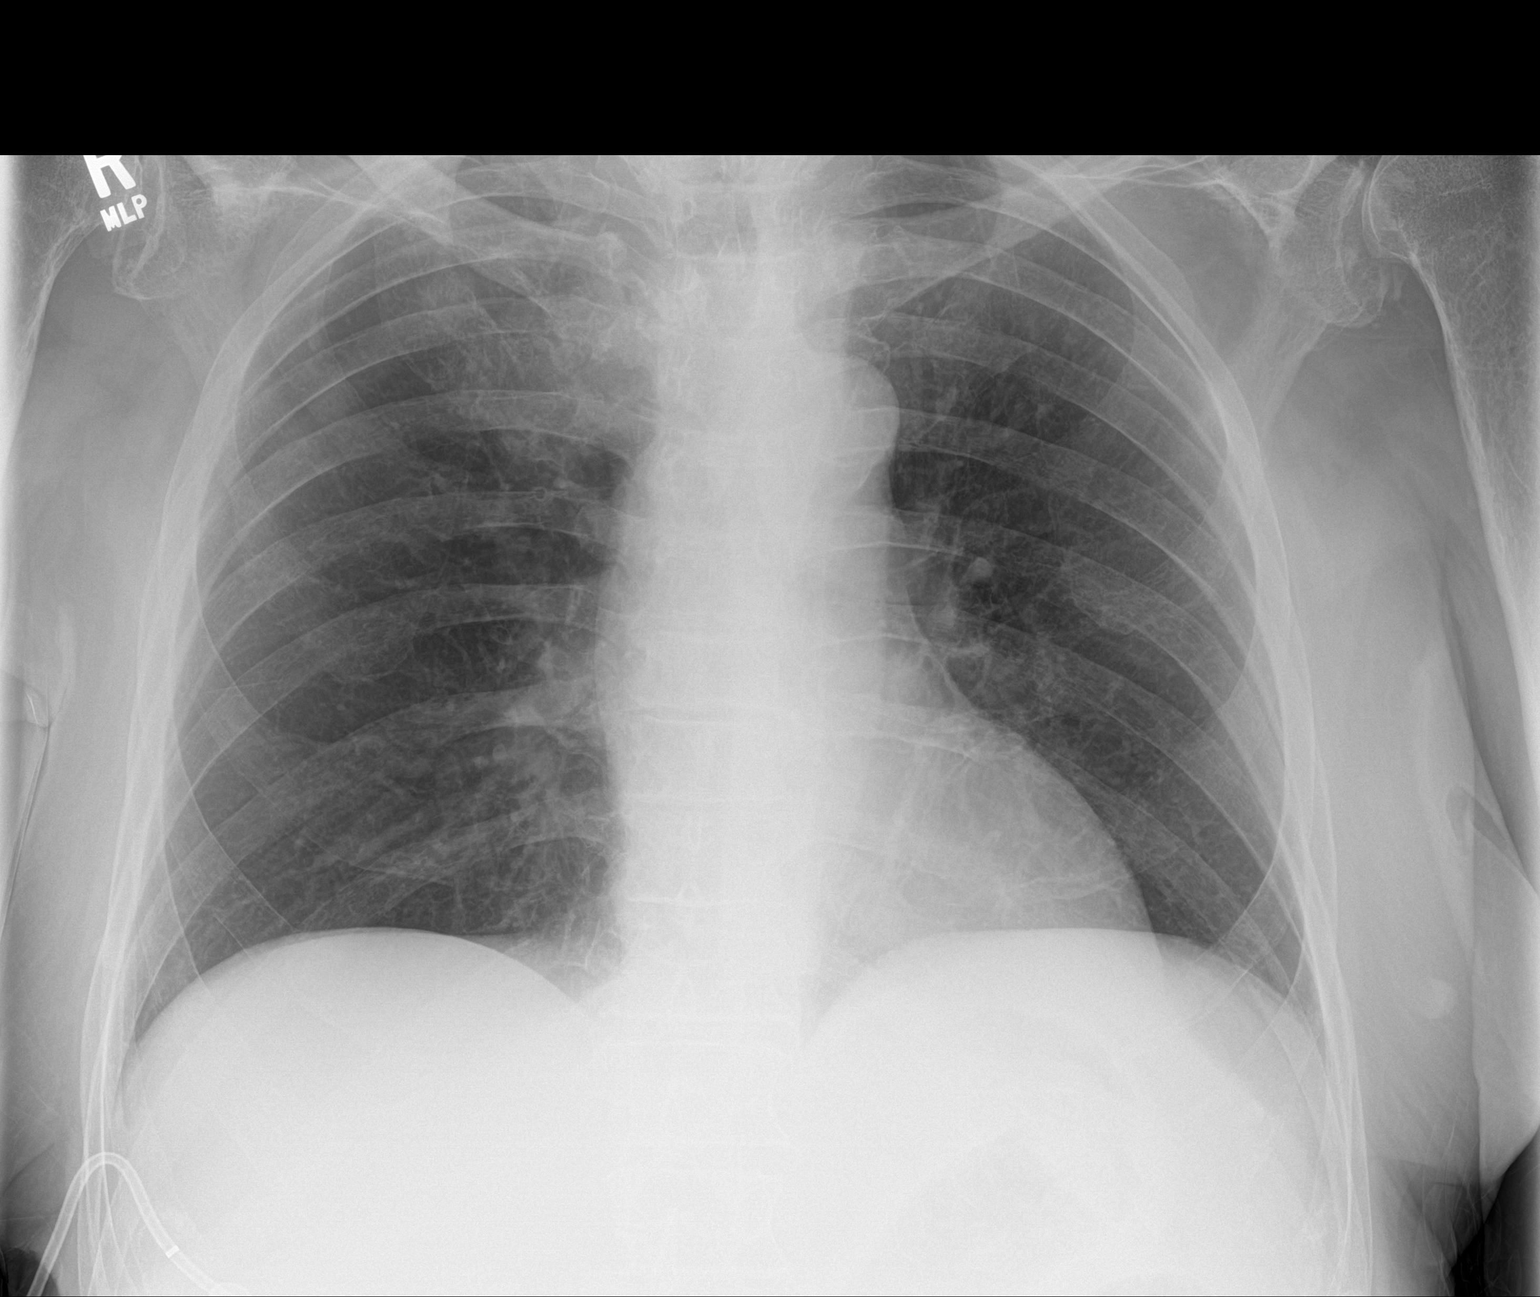

[1 of 1 positions shown; findings below may reference images not displayed]

FINDINGS: The heart size and mediastinal contours are within normal limits.
Both lungs are clear. No pleural effusion. The visualized skeletal
structures are unremarkable.
IMPRESSION: No acute process in the chest.

## 2021-04-20 IMAGING — US US RENAL
1 series · 15 of 25 positions shown · non-contrast
Comparison: Abdomen CT [DATE]

CLINICAL DATA: UTI.

EXAM:
RENAL / URINARY TRACT ULTRASOUND COMPLETE

[Series 1: us renal mc & wl · 15 of 39 slices shown]
[im 1/39]
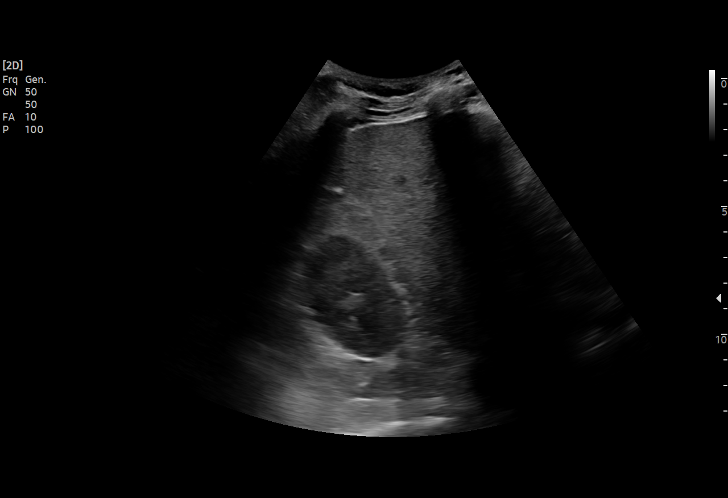
[im 4/39]
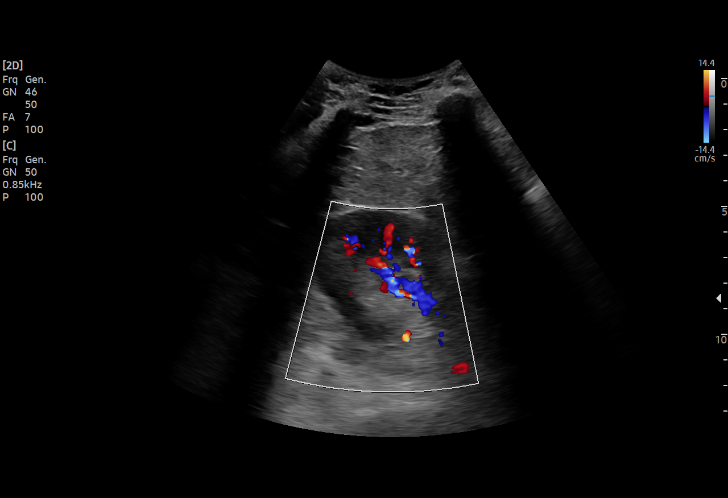
[im 7/39]
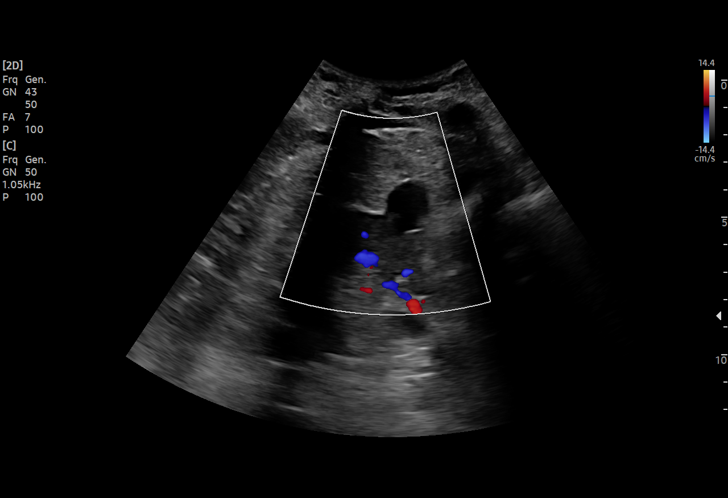
[im 8/39]
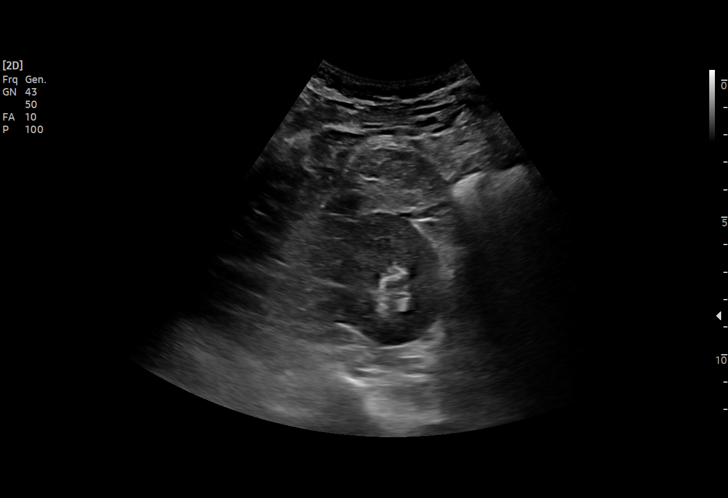
[im 12/39]
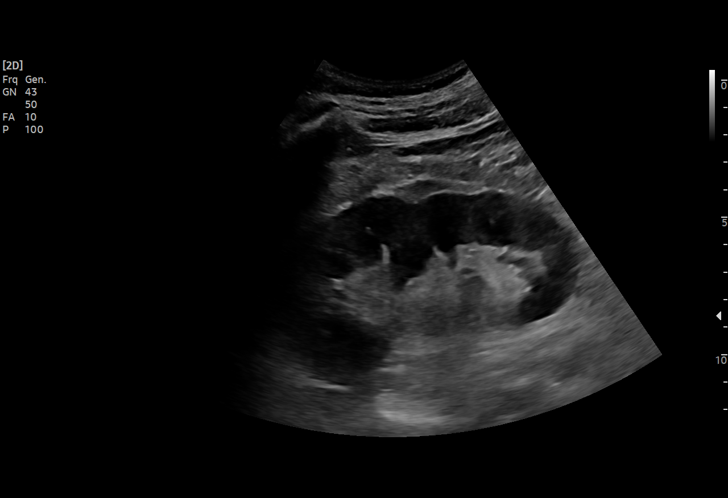
[im 15/39]
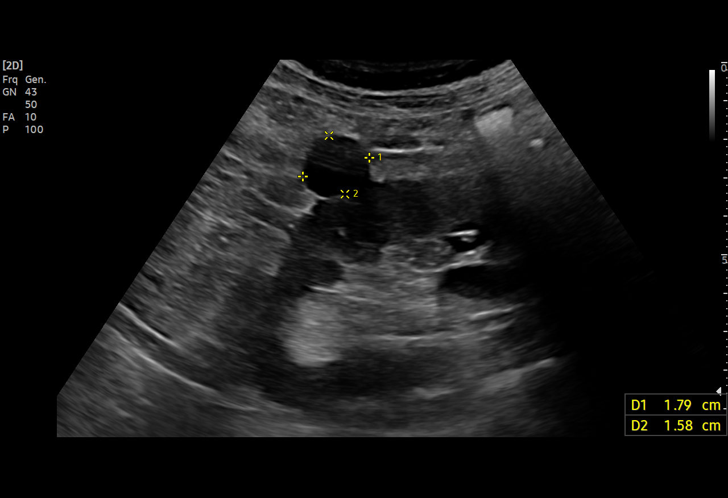
[im 16/39]
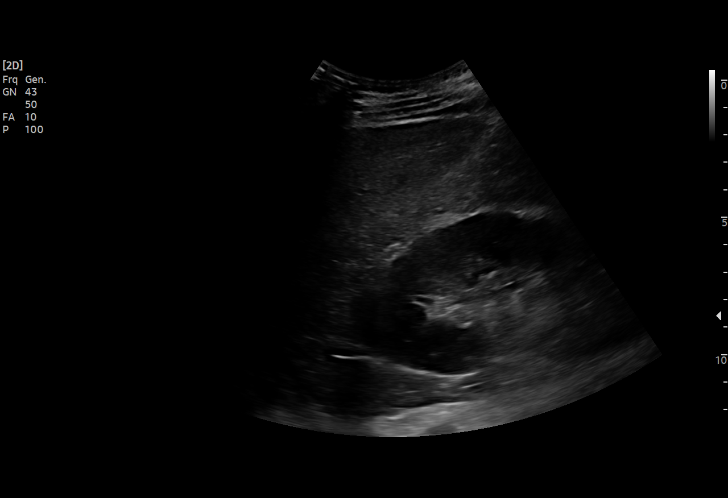
[im 20/39]
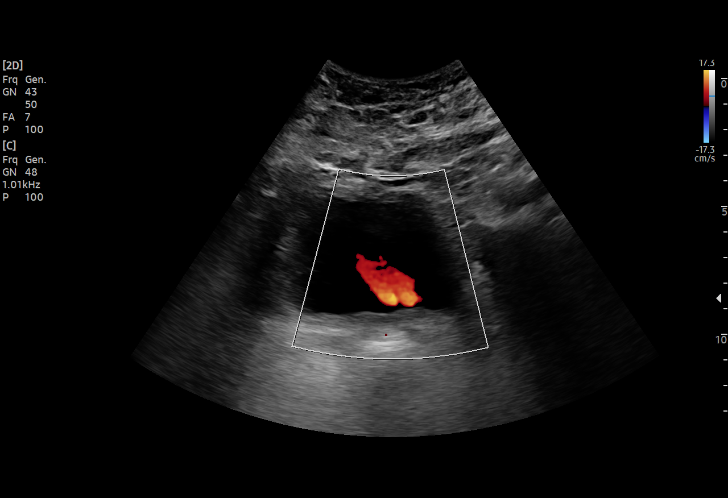
[im 23/39]
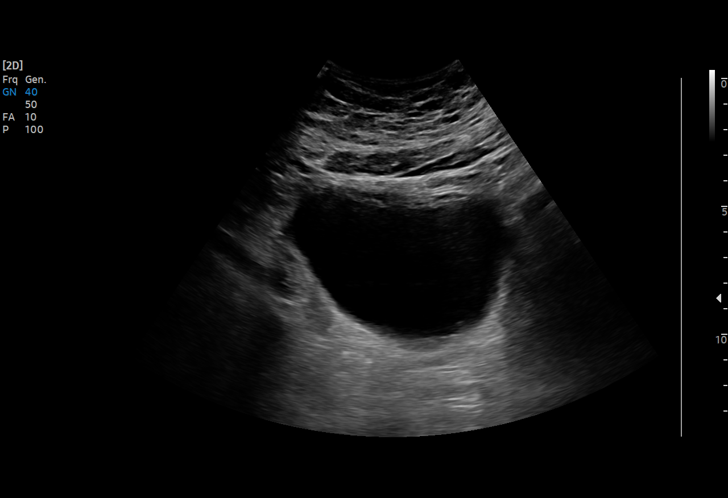
[im 24/39]
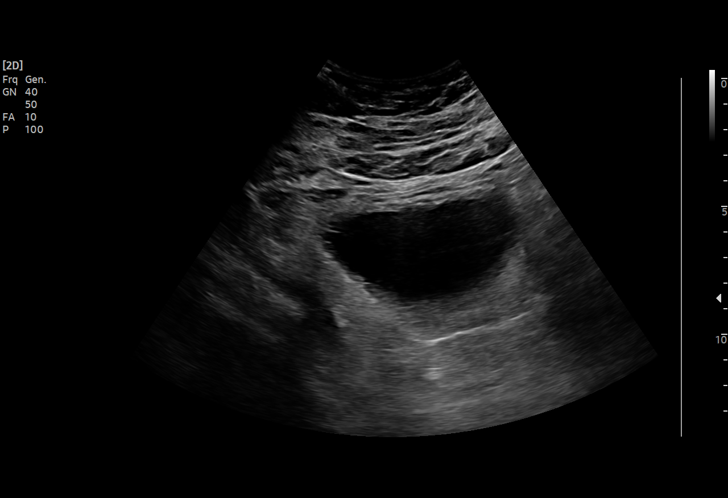
[im 27/39]
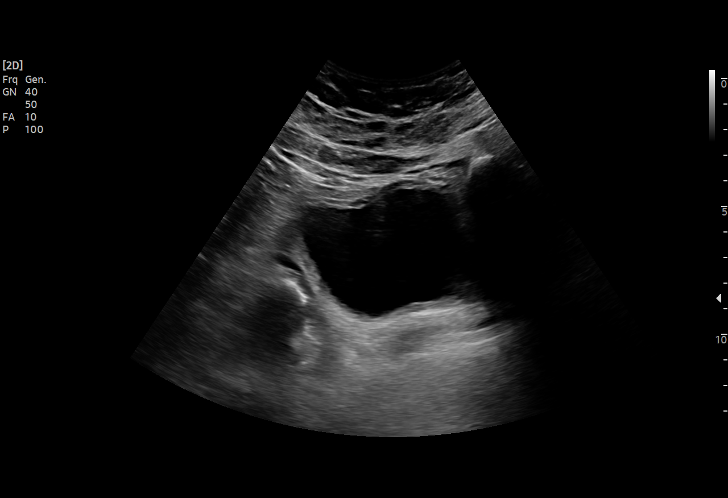
[im 31/39]
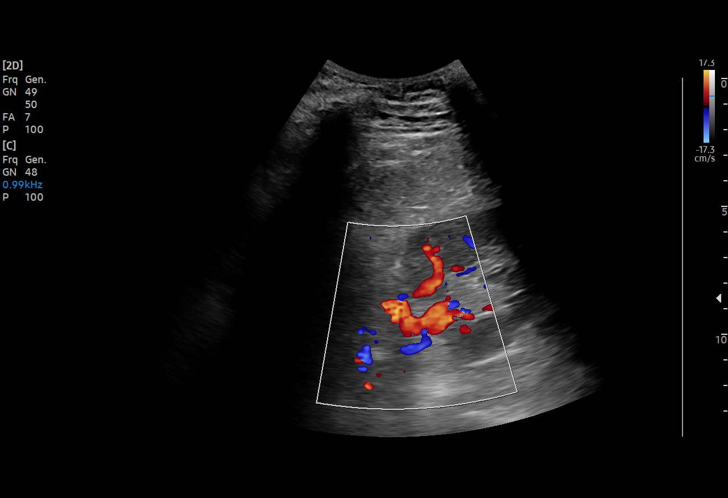
[im 32/39]
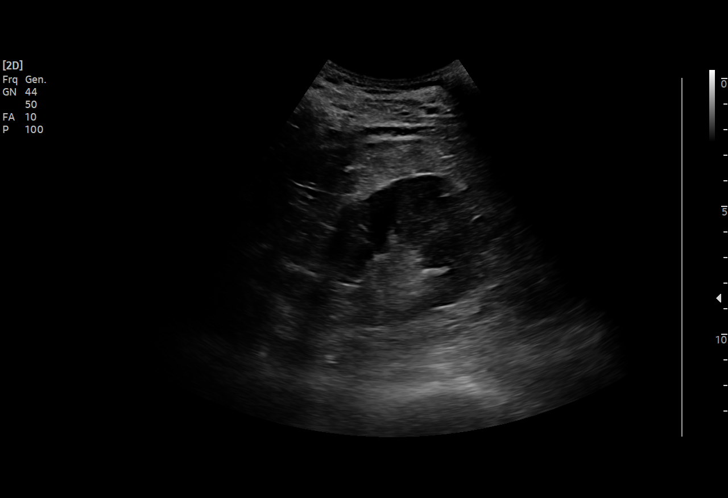
[im 35/39]
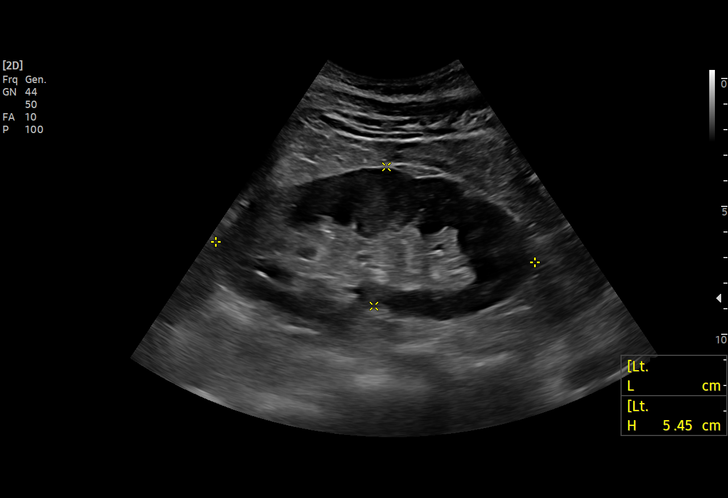
[im 39/39]
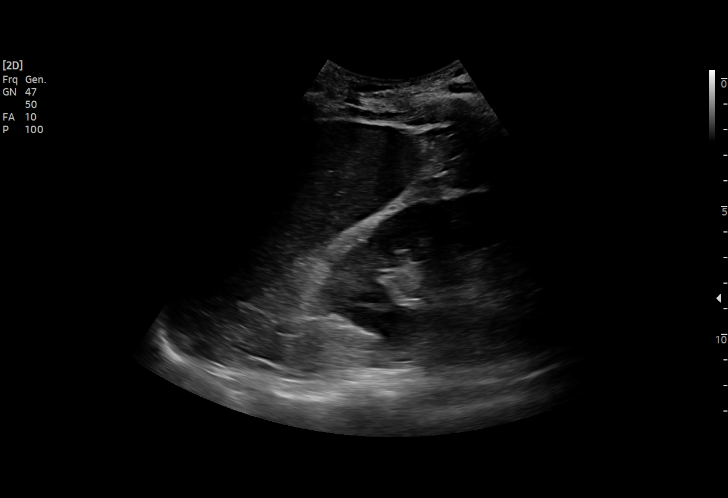

[15 of 25 positions shown; findings below may reference images not displayed]

FINDINGS: Right Kidney:

Renal measurements: 11.6 x 5.3 x 4.6 cm = volume: 148 mL. Exophytic
interpolar cyst noted as seen on prior CT. No hydronephrosis. Normal
parenchymal echogenicity.

Left Kidney:

Renal measurements: 12.5 x 5.5 x 5.5 cm = volume: 195 mL.
Echogenicity within normal limits. No mass or hydronephrosis
visualized.

Bladder:

Appears normal for degree of bladder distention.

Other:

None.
IMPRESSION: Unremarkable.  No hydronephrosis.

## 2021-04-20 MED ORDER — LACTATED RINGERS IV SOLN: INTRAVENOUS | Status: DC

## 2021-04-20 MED ORDER — SODIUM CHLORIDE 0.9 % IV SOLN
1.0000 g | INTRAVENOUS | Status: DC
  Administered 2021-04-21 – 2021-04-22 (×2): 1 g via INTRAVENOUS
  Filled 2021-04-20 (×2): qty 10

## 2021-04-20 MED ORDER — TRAZODONE HCL 50 MG PO TABS
50.0000 mg | ORAL_TABLET | Freq: Every day | ORAL | Status: DC
  Administered 2021-04-20 – 2021-04-21 (×2): 50 mg via ORAL
  Filled 2021-04-20 (×2): qty 1

## 2021-04-20 MED ORDER — INSULIN ASPART 100 UNIT/ML IJ SOLN
0.0000 [IU] | Freq: Three times a day (TID) | INTRAMUSCULAR | Status: DC
  Administered 2021-04-22: 1 [IU] via SUBCUTANEOUS
  Filled 2021-04-20: qty 0.09

## 2021-04-20 MED ORDER — LACTATED RINGERS IV BOLUS
1000.0000 mL | Freq: Once | INTRAVENOUS | Status: AC
  Administered 2021-04-20: 1000 mL via INTRAVENOUS

## 2021-04-20 MED ORDER — ASPIRIN EC 81 MG PO TBEC
81.0000 mg | DELAYED_RELEASE_TABLET | Freq: Every day | ORAL | Status: DC
  Administered 2021-04-20 – 2021-04-21 (×2): 81 mg via ORAL
  Filled 2021-04-20 (×2): qty 1

## 2021-04-20 MED ORDER — ONDANSETRON HCL 4 MG PO TABS: 4.0000 mg | ORAL_TABLET | Freq: Four times a day (QID) | ORAL | Status: DC | PRN

## 2021-04-20 MED ORDER — METRONIDAZOLE 500 MG/100ML IV SOLN
500.0000 mg | Freq: Three times a day (TID) | INTRAVENOUS | Status: DC
  Administered 2021-04-20 – 2021-04-21 (×3): 500 mg via INTRAVENOUS
  Filled 2021-04-20 (×3): qty 100

## 2021-04-20 MED ORDER — ACETAMINOPHEN 325 MG PO TABS
650.0000 mg | ORAL_TABLET | Freq: Four times a day (QID) | ORAL | Status: DC | PRN
Start: 2021-04-20 — End: 2021-04-23
  Administered 2021-04-21 – 2021-04-22 (×2): 650 mg via ORAL
  Filled 2021-04-20 (×3): qty 2

## 2021-04-20 MED ORDER — ALBUTEROL SULFATE (2.5 MG/3ML) 0.083% IN NEBU: 2.5000 mg | INHALATION_SOLUTION | RESPIRATORY_TRACT | Status: DC | PRN

## 2021-04-20 MED ORDER — TAMSULOSIN HCL 0.4 MG PO CAPS
0.4000 mg | ORAL_CAPSULE | Freq: Every day | ORAL | Status: DC
  Administered 2021-04-21 – 2021-04-22 (×2): 0.4 mg via ORAL
  Filled 2021-04-20 (×2): qty 1

## 2021-04-20 MED ORDER — SIMVASTATIN 40 MG PO TABS
40.0000 mg | ORAL_TABLET | Freq: Every day | ORAL | Status: DC
  Administered 2021-04-20 – 2021-04-21 (×2): 40 mg via ORAL
  Filled 2021-04-20 (×2): qty 1

## 2021-04-20 MED ORDER — METRONIDAZOLE 500 MG/100ML IV SOLN: 500.0000 mg | Freq: Once | INTRAVENOUS | Status: DC

## 2021-04-20 MED ORDER — LORAZEPAM 0.5 MG PO TABS
0.5000 mg | ORAL_TABLET | Freq: Three times a day (TID) | ORAL | Status: DC | PRN
  Filled 2021-04-20: qty 1

## 2021-04-20 MED ORDER — SENNOSIDES-DOCUSATE SODIUM 8.6-50 MG PO TABS
1.0000 | ORAL_TABLET | Freq: Every evening | ORAL | Status: DC | PRN
  Administered 2021-04-22: 1 via ORAL
  Filled 2021-04-20: qty 1

## 2021-04-20 MED ORDER — ONDANSETRON HCL 4 MG/2ML IJ SOLN: 4.0000 mg | Freq: Four times a day (QID) | INTRAMUSCULAR | Status: DC | PRN

## 2021-04-20 MED ORDER — ACETAMINOPHEN 650 MG RE SUPP: 650.0000 mg | Freq: Four times a day (QID) | RECTAL | Status: DC | PRN

## 2021-04-20 MED ORDER — VITAMIN B-12 1000 MCG PO TABS
1000.0000 ug | ORAL_TABLET | Freq: Every day | ORAL | Status: DC
  Administered 2021-04-21 – 2021-04-22 (×2): 1000 ug via ORAL
  Filled 2021-04-20 (×2): qty 1

## 2021-04-20 MED ORDER — INSULIN ASPART 100 UNIT/ML IJ SOLN
0.0000 [IU] | Freq: Every day | INTRAMUSCULAR | Status: DC
Start: 2021-04-20 — End: 2021-04-23
  Filled 2021-04-20: qty 0.05

## 2021-04-20 MED ORDER — SODIUM CHLORIDE 0.9 % IV SOLN
1.0000 g | Freq: Once | INTRAVENOUS | Status: AC
  Administered 2021-04-20: 1 g via INTRAVENOUS
  Filled 2021-04-20: qty 10

## 2021-04-20 NOTE — ED Notes (Signed)
Per lab lactic acid was slightly hemolyzed.  Specimen drawn again to confirm.

## 2021-04-20 NOTE — ED Notes (Signed)
Condom cath placed will give sample when able

## 2021-04-20 NOTE — H&P (Signed)
History and Physical    Vincent [name] TMH:[id number] DOB: Dec 28, 1950 DOA: 04/20/2021  PCP: Ginger Organ., MD   Patient coming from: Home  I have personally briefly reviewed patient's old medical records in [place]  Chief Complaint: Urinary frequency  HPI: Vincent [name] is a 70 y.o. male with medical history significant of hypertension, diabetes mellitus type 2, unspecified stroke in 01/2021 with residual speech difficulty and being bedbound, chronic combined heart failure, CAD, prior UTIs, depression, anxiety, chronic cholecystitis with current cholecystostomy tube (placed on 02/08/2021 by IR with plans for outpatient follow-up with IR for drain change/removal few weeks ago which could not be done because of transportation problems and possible subsequent cholecystectomy by general surgery) presented with increased urinary frequency.  Patient is a poor historian because of his speech issues and does not participate in conversation much.  I have reviewed patient's medical records myself and have spoken to ER provider.  Patient apparently has been having increasing urination and ongoing straining with urination but has gotten worse over the last few days.  His oral intake has decreased and he is losing weight.  She also complains of chronic cough but has not gotten worse and she has not noticed any choking when he eats.  Wife states that the gallbladder drain area on the skin looks more inflamed and is more painful to the patient apparently.  Drain is still producing significant amount of clear liquid.  No fever or vomiting noted.  Patient has been more confused over the last 24 hours.  No loss of consciousness, seizures reported.  ED Course: He was found to have elevated lactic acid level of 3.3 and the repeat was 3.5.  He was given IV fluids.  UA was consistent with UTI.  He was started on IV antibiotics.  Chest x-ray was negative for infiltrates. Hospitalist service was called to  evaluate the patient.  Review of Systems: Could not be assessed because of patient being a poor historian  Past Medical History:  Diagnosis Date   Acute MI ([place])    Allergy    Anxiety    Arthritis    Coronary artery disease    Depression    Diabetes mellitus without complication ([place])    Diverticulosis    mild   Hemorrhoids    HOH (hard of hearing)    Hx of colonic polyps    Hyperlipidemia    Hypertension    Left ventricular dysfunction    hx of    Past Surgical History:  Procedure Laterality Date   ANGIOPLASTY     and bare metal stent placement   ANKLE SURGERY Right 1986   i&d abscess- pt states "put a bone in it"   CORONARY ANGIOPLASTY WITH STENT PLACEMENT     IR PERC CHOLECYSTOSTOMY  02/07/2021   KNEE ARTHROPLASTY Right 08/09/2016   Procedure: RIGHT TOTAL KNEE ARTHROPLASTY WITH COMPUTER NAVIGATION;  Surgeon: Rod Can, MD;  Location: [facility];  Service: Orthopedics;  Laterality: Right;  Needs RNFA   LEFT HEART CATH AND CORONARY ANGIOGRAPHY N/A 11/21/2018   Procedure: LEFT HEART CATH AND CORONARY ANGIOGRAPHY;  Surgeon: Burnell Blanks, MD;  Location: [facility];  Service: Cardiovascular;  Laterality: N/A;     reports that he quit smoking about 15 years ago. His smoking use included cigars. He quit smokeless tobacco use about 15 years ago. He reports that he does not drink alcohol and does not use drugs.  Allergies  Allergen Reactions  Penicillins Other (See Comments)    Disputed in 2022- Has tolerated cephalexin in the past  Has patient had a PCN reaction causing immediate rash, facial/tongue/throat swelling, SOB or lightheadedness with hypotension: Unknown Has patient had a PCN reaction causing severe rash involving mucus membranes or skin necrosis: Unknown Has patient had a PCN reaction that required hospitalization: Unknown Has patient had a PCN reaction occurring within the last 10 years: No      Family History  Problem Relation Age of Onset    Coronary artery disease Other        positive family hx of   Colon cancer Neg Hx    Esophageal cancer Neg Hx    Liver cancer Neg Hx    Pancreatic cancer Neg Hx    Rectal cancer Neg Hx    Stomach cancer Neg Hx     Prior to Admission medications   Medication Sig Start Date End Date Taking? Authorizing Provider  aspirin EC 81 MG tablet Take 1 tablet (81 mg total) by mouth at bedtime. 03/18/21  Yes Florencia Reasons, MD  LORazepam (ATIVAN) 1 MG tablet Take 1 mg by mouth every 8 (eight) hours as needed for anxiety.   Yes [provider]  Magnesium Oxide (MAG-OXIDE) 200 MG TABS Take 1 tablet (200 mg total) by mouth daily at 12 noon. 03/18/21  Yes Florencia Reasons, MD  metFORMIN (GLUCOPHAGE) 1000 MG tablet Take 1,000 mg by mouth 2 (two) times daily.   Yes [provider]  pantoprazole (PROTONIX) 40 MG tablet Take 1 tablet (40 mg total) by mouth daily. 02/12/21  Yes Lavina Hamman, MD  QUEtiapine (SEROQUEL) 25 MG tablet Take 1 tablet (25 mg total) by mouth at bedtime. 03/18/21  Yes Florencia Reasons, MD  simvastatin (ZOCOR) 40 MG tablet Take 1 tablet (40 mg total) by mouth at bedtime. 11/04/17  Yes Lyda Jester M, PA-C  tamsulosin (FLOMAX) 0.4 MG CAPS capsule Take 1 capsule (0.4 mg total) by mouth daily. 03/18/21  Yes Florencia Reasons, MD  traZODone (DESYREL) 50 MG tablet Take 50 mg by mouth at bedtime. 02/28/21  Yes [provider]  vitamin B-12 (CYANOCOBALAMIN) 1000 MCG tablet Take 1 tablet (1,000 mcg total) by mouth daily. 01/27/21  Yes Hosie Poisson, MD  docusate sodium (COLACE) 100 MG capsule Take 2 capsules (200 mg total) by mouth 2 (two) times daily as needed for mild constipation. Patient not taking: Reported on 04/20/2021 03/18/21   Florencia Reasons, MD  furosemide (LASIX) 40 MG tablet Take 1 tablet (40 mg total) by mouth daily as needed for fluid or edema. Patient not taking: Reported on 04/20/2021 03/18/21   Florencia Reasons, MD  Maltodextrin-Xanthan Gum (RESOURCE THICKENUP CLEAR) POWD Take 4 g by mouth See admin  instructions. Use with every meals. Patient not taking: Reported on 04/20/2021 03/18/21   Florencia Reasons, MD  thiamine 100 MG tablet Take 1 tablet (100 mg total) by mouth daily. Patient not taking: Reported on 04/20/2021 01/28/21   Hosie Poisson, MD  Zinc Oxide (TRIPLE PASTE) 12.8 % ointment Apply topically 3 (three) times daily. Patient not taking: Reported on 04/20/2021 03/18/21   Florencia Reasons, MD    Physical Exam: Vitals:   04/20/21 1430 04/20/21 1455 04/20/21 1620 04/20/21 1624  BP: (!) 106/57 (!) 142/60 99/79 107/62  Pulse: (!) 117 (!) 53 99 (!) 101  Resp:  18 18   Temp:      SpO2: 100% 99% 96% 98%    Constitutional: NAD, calm, comfortable.  Elderly  male lying in bed.  Thinly built.  Extremely poor historian. Vitals:   04/20/21 1430 04/20/21 1455 04/20/21 1620 04/20/21 1624  BP: (!) 106/57 (!) 142/60 99/79 107/62  Pulse: (!) 117 (!) 53 99 (!) 101  Resp:  18 18   Temp:      SpO2: 100% 99% 96% 98%   Eyes: PERRL, lids and conjunctivae normal ENMT: Mucous membranes are dry. Posterior pharynx clear of any exudate or lesions. Neck: normal, supple, no masses, no thyromegaly Respiratory: bilateral decreased breath sounds at bases, no wheezing, no crackles. Normal respiratory effort. No accessory muscle use.  Cardiovascular: S1 S2 positive, intermittently tachycardic. No extremity edema. 2+ pedal pulses.  Abdomen: Mildly tender around right upper quadrant cholecystostomy tube with some surrounding erythema but without any drainage.  Clear bile present in the bag.  No masses palpated. No hepatosplenomegaly. Bowel sounds positive.  Musculoskeletal: no clubbing / cyanosis. No joint deformity upper and lower extremities.  Skin: no rashes, lesions, ulcers. No induration Neurologic: Awake, dysarthric and hardly participates in any conversation. Psychiatric: Could not be assessed because of mental status   Labs on Admission: I have personally reviewed following labs and imaging studies  CBC: Recent  Labs  Lab 04/20/21 1136  WBC 8.0  NEUTROABS 5.8  HGB 15.6  HCT 49.3  MCV 89.5  PLT 784   Basic Metabolic Panel: Recent Labs  Lab 04/20/21 1136  NA 136  K 3.3*  CL 100  CO2 22  GLUCOSE 149*  BUN 8  CREATININE 0.61  CALCIUM 9.5   GFR: CrCl cannot be calculated (Unknown ideal weight.). Liver Function Tests: Recent Labs  Lab 04/20/21 1136  AST 24  ALT 13  ALKPHOS 146*  BILITOT 0.6  PROT 7.3  ALBUMIN 3.9   No results for input(s): LIPASE, AMYLASE in the last 168 hours. No results for input(s): AMMONIA in the last 168 hours. Coagulation Profile: No results for input(s): INR, PROTIME in the last 168 hours. Cardiac Enzymes: No results for input(s): CKTOTAL, CKMB, CKMBINDEX, TROPONINI in the last 168 hours. BNP (last 3 results) No results for input(s): PROBNP in the last 8760 hours. HbA1C: No results for input(s): HGBA1C in the last 72 hours. CBG: No results for input(s): GLUCAP in the last 168 hours. Lipid Profile: No results for input(s): CHOL, HDL, LDLCALC, TRIG, CHOLHDL, LDLDIRECT in the last 72 hours. Thyroid Function Tests: No results for input(s): TSH, T4TOTAL, FREET4, T3FREE, THYROIDAB in the last 72 hours. Anemia Panel: No results for input(s): VITAMINB12, FOLATE, FERRITIN, TIBC, IRON, RETICCTPCT in the last 72 hours. Urine analysis:    Component Value Date/Time   COLORURINE YELLOW 04/20/2021 1313   APPEARANCEUR HAZY (A) 04/20/2021 1313   LABSPEC 1.005 04/20/2021 1313   PHURINE 6.0 04/20/2021 1313   GLUCOSEU NEGATIVE 04/20/2021 1313   HGBUR SMALL (A) 04/20/2021 1313   BILIRUBINUR NEGATIVE 04/20/2021 1313   KETONESUR NEGATIVE 04/20/2021 1313   PROTEINUR NEGATIVE 04/20/2021 1313   UROBILINOGEN 0.2 01/06/2014 1257   NITRITE NEGATIVE 04/20/2021 1313   LEUKOCYTESUR LARGE (A) 04/20/2021 1313    Radiological Exams on Admission: DG Chest Port 1 View  Result Date: 04/20/2021 CLINICAL DATA:  Altered mental status EXAM: PORTABLE CHEST 1 VIEW COMPARISON:   03/14/2021 FINDINGS: The heart size and mediastinal contours are within normal limits. Both lungs are clear. No pleural effusion. The visualized skeletal structures are unremarkable. IMPRESSION: No acute process in the chest. Electronically Signed   By: Macy Mis M.D.   On: 04/20/2021 11:49  Assessment/Plan  UTI Lactic acidosis -Patient has history of recurrent UTIs.  During last admission, he had E. coli in urine cultures sensitive to Rocephin. -Continue Rocephin.  Follow cultures.  Check renal ultrasound to rule out hydronephrosis/prostate issues  Chronic cholecystitis with indwelling cholecystostomy drain -Patient was supposed to have follow-up with IR few weeks ago for possible drain removal or change but never made it to the appointment.  IR consult.  General surgery consultation for decision of timing of surgery -will add Flagyl to the antibiotic regimen to cover for anaerobes  Acute metabolic encephalopathy History of unspecified stroke with residual dysarthria, poor memory and bedbound status Failure to thrive -Monitor mental status.  SLP evaluation.  Fall precautions. -Palliative care consultation for goals of care discussion.  Chronic combined heart failure -EF of 40 to 45% in April 2022.  No signs of decompensation.  Strict input output.  Daily weights.  Fluid restriction.  Hold Lasix for now which patient uses as needed basis only  Diabetes mellitus type 2 -A1c 7.2 in 01/2021.  CBGs with SSI.  BPH -Continue Flomax   DVT prophylaxis: SCDs.  Hold Lovenox or heparin in case patient undergoes surgical intervention Code Status: DNR as per prior records Family Communication: None.  Tried calling wife but she did not pick up.  ED provider had spoken to the wife. Disposition Plan: Home in 2 to 3 days once clinically improves Consults called: IR/general surgery Admission status: Inpatient/telemetry  Severity of Illness: The appropriate patient status for this patient  is INPATIENT. Inpatient status is judged to be reasonable and necessary in order to provide the required intensity of service to ensure the patient's safety. The patient's presenting symptoms, physical exam findings, and initial radiographic and laboratory data in the context of their chronic comorbidities is felt to place them at high risk for further clinical deterioration. Furthermore, it is not anticipated that the patient will be medically stable for discharge from the hospital within 2 midnights of admission. The following factors support the patient status of inpatient.   " The patient's presenting symptoms include altered mental status/poor oral intake/urinary frequency. " The worrisome physical exam findings include metabolic encephalopathy/tachycardia. " The initial radiographic and laboratory data are worrisome because of UTI/lactic acidosis. " The chronic co-morbidities include chronic cholecystitis with drain/history of unspecified stroke with bedbound status.   * I certify that at the point of admission it is my clinical judgment that the patient will require inpatient hospital care spanning beyond 2 midnights from the point of admission due to high intensity of service, high risk for further deterioration and high frequency of surveillance required.Aline August MD [facility]  04/20/2021, 5:08 PM

## 2021-04-20 NOTE — ED Provider Notes (Signed)
[facility] DEPT Provider Note   CSN: [id number] Arrival date & time: 04/20/21  1034     History Chief Complaint  Patient presents with   Urinary Frequency    Vincent [name] is a 70 y.o. male.  Patient is a 70 year old male with a history of chronic cholecystitis with drain in place, CAD, hypertension, diabetes, stroke in 422 of this year which has left the patient with speech difficulty and being bedbound, CHF, prior UTIs who is presenting today from home per his wife's request.  She reports that since his last hospitalization at the beginning of June he has had ongoing straining with urination but in the last week or so it has become more constant and he is having significant frequency, urgency but produces very little urine.  He has been eating but eats less than usual and she does notice he is losing weight.  He has a chronic cough but it has not gotten worse and has not been productive.  She does not notice some choking when he eats.  He was supposed to have either the gallbladder drain changed or removed 3 weeks ago but they did not have transport to the hospital and it is still in place.  However in the last 1 to 2 days it has looked more inflamed and he is complained of more pain in the area.  However the drain continues to produce 2 to 300 mL or more per day.  The liquid has remained clear.  There has not been any lower extremity swelling or fever but in the last 24 hours his wife has noted he has been more altered meaning he is having difficulty following simple instructions which he normally can do.  No medication changes in the last month and no recent antibiotics.   The history is provided by the patient, the spouse and medical records.  Urinary Frequency      Past Medical History:  Diagnosis Date   Acute MI [facility])    Allergy    Anxiety    Arthritis    Coronary artery disease    Depression    Diabetes mellitus without complication ([place])     Diverticulosis    mild   Hemorrhoids    HOH (hard of hearing)    Hx of colonic polyps    Hyperlipidemia    Hypertension    Left ventricular dysfunction    hx of    Patient Active Problem List   Diagnosis Date Noted   UTI (urinary tract infection) 03/14/2021   AKI (acute kidney injury) ([place]) 03/14/2021   Acute encephalopathy 03/14/2021   Hypokalemia 03/14/2021   Depression    Anxiety    Severe sepsis with septic shock ([place]) 02/06/2021   Chronic cholecystitis 02/06/2021   Sacral decubitus ulcer 02/06/2021   Severe sepsis ([place]) 02/06/2021   Vitamin B12 deficiency 01/27/2021   Diabetes 1.5, managed as type 2 ([place]) 24/40/1027   Chronic systolic CHF (congestive heart failure) ([place]) 01/27/2021   CVA (cerebral vascular accident) ([place]) 01/12/2021   Weakness    Precordial pain    Right knee pain 01/07/2018   History of total right knee replacement 01/07/2018   SOB (shortness of breath) 08/12/2016   Tachycardia 08/12/2016   Low grade fever 08/12/2016   Osteoarthritis of right knee 08/09/2016   OSA (obstructive sleep apnea) 11/29/2011   Syncope 07/24/2011   ARM NUMBNESS 06/19/2010   EDEMA 03/13/2010   FASTING HYPERGLYCEMIA 03/13/2010   CORONARY ATHEROSCLEROSIS NATIVE CORONARY  ARTERY 06/20/2009   COLONIC POLYPS, ADENOMATOUS 01/31/2009   HLD (hyperlipidemia) 01/31/2009   Hypertension 01/31/2009   Coronary atherosclerosis 01/31/2009   HEMORRHOIDS 01/31/2009   DIVERTICULOSIS, MILD 01/31/2009    Past Surgical History:  Procedure Laterality Date   ANGIOPLASTY     and bare metal stent placement   ANKLE SURGERY Right 1986   i&d abscess- pt states "put a bone in it"   CORONARY ANGIOPLASTY WITH STENT PLACEMENT     IR PERC CHOLECYSTOSTOMY  02/07/2021   KNEE ARTHROPLASTY Right 08/09/2016   Procedure: RIGHT TOTAL KNEE ARTHROPLASTY WITH COMPUTER NAVIGATION;  Surgeon: Rod Can, MD;  Location: [facility];  Service: Orthopedics;  Laterality: Right;  Needs RNFA   LEFT HEART CATH AND  CORONARY ANGIOGRAPHY N/A 11/21/2018   Procedure: LEFT HEART CATH AND CORONARY ANGIOGRAPHY;  Surgeon: Burnell Blanks, MD;  Location: [facility];  Service: Cardiovascular;  Laterality: N/A;       Family History  Problem Relation Age of Onset   Coronary artery disease Other        positive family hx of   Colon cancer Neg Hx    Esophageal cancer Neg Hx    Liver cancer Neg Hx    Pancreatic cancer Neg Hx    Rectal cancer Neg Hx    Stomach cancer Neg Hx     Social History   Tobacco Use   Smoking status: Former    Types: Cigars    Quit date: 12/17/2005    Years since quitting: 15.3   Smokeless tobacco: Former    Quit date: 12/17/2005  Vaping Use   Vaping Use: Never used  Substance Use Topics   Alcohol use: No   Drug use: No    Home Medications Prior to Admission medications   Medication Sig Start Date End Date Taking? Authorizing Provider  acetaminophen (TYLENOL) 500 MG tablet Take 1,000 mg by mouth every 6 (six) hours as needed for mild pain, fever or headache.    [provider]  aspirin EC 81 MG tablet Take 1 tablet (81 mg total) by mouth at bedtime. 03/18/21   Florencia Reasons, MD  docusate sodium (COLACE) 100 MG capsule Take 2 capsules (200 mg total) by mouth 2 (two) times daily as needed for mild constipation. 03/18/21   Florencia Reasons, MD  furosemide (LASIX) 40 MG tablet Take 1 tablet (40 mg total) by mouth daily as needed for fluid or edema. 03/18/21   Florencia Reasons, MD  LORazepam (ATIVAN) 1 MG tablet Take 1 mg by mouth every 8 (eight) hours as needed for anxiety.    [provider]  Magnesium Oxide (MAG-OXIDE) 200 MG TABS Take 1 tablet (200 mg total) by mouth daily at 12 noon. 03/18/21   Florencia Reasons, MD  Maltodextrin-Xanthan Gum (RESOURCE THICKENUP CLEAR) POWD Take 4 g by mouth See admin instructions. Use with every meals. 03/18/21   Florencia Reasons, MD  metFORMIN (GLUCOPHAGE) 1000 MG tablet Take 1,000 mg by mouth 2 (two) times daily.    [provider]   pantoprazole (PROTONIX) 40 MG tablet Take 1 tablet (40 mg total) by mouth daily. 02/12/21   Lavina Hamman, MD  QUEtiapine (SEROQUEL) 25 MG tablet Take 1 tablet (25 mg total) by mouth at bedtime. 03/18/21   Florencia Reasons, MD  simvastatin (ZOCOR) 40 MG tablet Take 1 tablet (40 mg total) by mouth at bedtime. 11/04/17   Lyda Jester M, PA-C  tamsulosin (FLOMAX) 0.4 MG CAPS capsule Take 1 capsule (0.4  mg total) by mouth daily. 03/18/21   Florencia Reasons, MD  thiamine 100 MG tablet Take 1 tablet (100 mg total) by mouth daily. 01/28/21   Hosie Poisson, MD  traZODone (DESYREL) 50 MG tablet Take 50 mg by mouth at bedtime. 02/28/21   [provider]  vitamin B-12 (CYANOCOBALAMIN) 1000 MCG tablet Take 1 tablet (1,000 mcg total) by mouth daily. 01/27/21   Hosie Poisson, MD  Zinc Oxide (TRIPLE PASTE) 12.8 % ointment Apply topically 3 (three) times daily. 03/18/21   Florencia Reasons, MD    Allergies    Penicillins  Review of Systems   Review of Systems  Genitourinary:  Positive for frequency.  All other systems reviewed and are negative.  Physical Exam Updated Vital Signs BP (!) 142/60   Pulse (!) 53   Temp 98.2 F (36.8 C)   Resp 18   SpO2 99%   Physical Exam Vitals and nursing note reviewed.  Constitutional:      General: He is not in acute distress.    Appearance: He is well-developed.  HENT:     Head: Normocephalic and atraumatic.     Mouth/Throat:     Mouth: Mucous membranes are moist.  Eyes:     Conjunctiva/sclera: Conjunctivae normal.     Pupils: Pupils are equal, round, and reactive to light.  Cardiovascular:     Rate and Rhythm: Regular rhythm. Tachycardia present.     Pulses: Normal pulses.     Heart sounds: No murmur heard. Pulmonary:     Effort: Pulmonary effort is normal. No respiratory distress.     Breath sounds: Normal breath sounds. No wheezing or rales.  Abdominal:     General: There is no distension.     Palpations: Abdomen is soft.     Tenderness: There is abdominal  tenderness. There is no guarding or rebound.     Comments: Mild tenderness with palpation around the right upper quadrant gallbladder drain.  Exudate present around the drain site with some mild erythema.  No purulent drainage noted.  Clear bile present in the bag approximately 200 mL  Musculoskeletal:        General: No tenderness. Normal range of motion.     Cervical back: Normal range of motion and neck supple.     Right lower leg: No edema.     Left lower leg: No edema.  Skin:    General: Skin is warm and dry.     Capillary Refill: Capillary refill takes less than 2 seconds.     Findings: No erythema or rash.  Neurological:     Mental Status: He is alert. Mental status is at baseline.     Comments: Alert and oriented  Psychiatric:        Behavior: Behavior normal. Behavior is cooperative.     Comments: Difficult to understand speech but mostly appropriate    ED Results / Procedures / Treatments   Labs (all labs ordered are listed, but only abnormal results are displayed) Labs Reviewed  CBC WITH DIFFERENTIAL/PLATELET - Abnormal; Notable for the following components:      Result Value   RDW 16.4 (*)    All other components within normal limits  COMPREHENSIVE METABOLIC PANEL - Abnormal; Notable for the following components:   Potassium 3.3 (*)    Glucose, Bld 149 (*)    Alkaline Phosphatase 146 (*)    All other components within normal limits  LACTIC ACID, PLASMA - Abnormal; Notable for the following components:  Lactic Acid, Venous 3.3 (*)    All other components within normal limits  URINALYSIS, ROUTINE W REFLEX MICROSCOPIC - Abnormal; Notable for the following components:   APPearance HAZY (*)    Hgb urine dipstick SMALL (*)    Leukocytes,Ua LARGE (*)    WBC, UA >50 (*)    Bacteria, UA MANY (*)    All other components within normal limits  URINE CULTURE  LACTIC ACID, PLASMA    EKG EKG Interpretation  Date/Time:  Thursday April 20 2021 11:11:53 EDT Ventricular  Rate:  117 PR Interval:  154 QRS Duration: 98 QT Interval:  340 QTC Calculation: 474 R Axis:   -4 Text Interpretation: Sinus tachycardia with occasional Premature ventricular complexes Nonspecific T wave abnormality No significant change since last tracing Confirmed by Blanchie Dessert ([id number]) on 04/20/2021 11:36:48 AM  Radiology DG Chest Port 1 View  Result Date: 04/20/2021 CLINICAL DATA:  Altered mental status EXAM: PORTABLE CHEST 1 VIEW COMPARISON:  03/14/2021 FINDINGS: The heart size and mediastinal contours are within normal limits. Both lungs are clear. No pleural effusion. The visualized skeletal structures are unremarkable. IMPRESSION: No acute process in the chest. Electronically Signed   By: Macy Mis M.D.   On: 04/20/2021 11:49    Procedures Procedures   Medications Ordered in ED Medications  lactated ringers bolus 1,000 mL (has no administration in time range)    ED Course  I have reviewed the triage vital signs and the nursing notes.  Pertinent labs & imaging results that were available during my care of the patient were reviewed by me and considered in my medical decision making (see chart for details).    MDM Rules/Calculators/A&P                          Patient is a 70 year old male with multiple medical problems presenting today from home due to worsening urinary frequency, urgency, mild changes in mental status and wife is concerned he may have a UTI.  Patient does have a history of chronic cholecystitis and has a right-sided gallbladder drain in that is still draining regularly.  He has some minimal tenderness and erythema around the drain but no significant abdominal pain.  Concern for possible infectious etiology versus AKI.  Patient is awake and alert.  Wife does report he eats less than normal but is still eating regularly.  Labs and imaging are pending.  3:54 PM His labs were significant for a lactic acid of 3.3 however nurse reported that she thinks it  was clotted prior to being sent.  So repeat lactic acid is still pending.  CBC within normal limits, CMP with a potassium of 3.3 but otherwise no acute findings, chest x-ray within normal limits.  EKG without acute findings.  After IV fluids patient's tachycardia has resolved.  UA with concern for UTI with greater than 50 white blood cells and many bacteria.  Patient's last urine in June grew out E. coli which was sensitive to cephalosporins.  He was given a gram of Rocephin.  We will repeat lactic acid.  Patient is otherwise well-appearing.  He was able to spontaneously void 1 L per nurse.  4:25 PM Repeat lactate remains elevated at 3.5.  Will give second L bolus and admit for further care.  MDM   Amount and/or Complexity of Data Reviewed Clinical lab tests: ordered and reviewed Tests in the radiology section of CPT: reviewed and ordered Tests in the medicine section of  CPT: ordered and reviewed     Final Clinical Impression(s) / ED Diagnoses Final diagnoses:  Acute cystitis without hematuria    Rx / DC Orders ED Discharge Orders     None        Blanchie Dessert, MD 04/20/21 1645

## 2021-04-20 NOTE — ED Triage Notes (Signed)
Pt bib [facility] for evaluation of possible UTI. Pt endorses burning and frequency with urination.  Pt has a surgical drain that was to be removed 3 weeks ago in place.  Per EMS pt was unable to obtain transport to appointment for removal.Bile remains in bag.  Pt w/ aphasia from hx of CVA.

## 2021-04-21 ENCOUNTER — Inpatient Hospital Stay (HOSPITAL_COMMUNITY): Payer: HMO

## 2021-04-21 DIAGNOSIS — L8990 Pressure ulcer of unspecified site, unspecified stage: Secondary | ICD-10-CM | POA: Insufficient documentation

## 2021-04-21 DIAGNOSIS — I2510 Atherosclerotic heart disease of native coronary artery without angina pectoris: Secondary | ICD-10-CM

## 2021-04-21 DIAGNOSIS — I5022 Chronic systolic (congestive) heart failure: Secondary | ICD-10-CM

## 2021-04-21 HISTORY — PX: IR EXCHANGE BILIARY DRAIN: IMG6046

## 2021-04-21 LAB — LACTIC ACID, PLASMA
Lactic Acid, Venous: 0.9 mmol/L (ref 0.5–1.9)
Lactic Acid, Venous: 0.8 mmol/L (ref 0.5–1.9)

## 2021-04-21 LAB — COMPREHENSIVE METABOLIC PANEL
ALT: 11 U/L (ref 0–44)
AST: 21 U/L (ref 15–41)
Albumin: 2.6 g/dL — ABNORMAL LOW (ref 3.5–5.0)
Alkaline Phosphatase: 104 U/L (ref 38–126)
Anion gap: 6 (ref 5–15)
BUN: 10 mg/dL (ref 8–23)
CO2: 25 mmol/L (ref 22–32)
Calcium: 8.4 mg/dL — ABNORMAL LOW (ref 8.9–10.3)
Chloride: 104 mmol/L (ref 98–111)
Creatinine, Ser: 0.47 mg/dL — ABNORMAL LOW (ref 0.61–1.24)
GFR, Estimated: >60 mL/min (ref >60)
Glucose, Bld: 109 mg/dL — ABNORMAL HIGH (ref 70–99)
Potassium: 3.4 mmol/L — ABNORMAL LOW (ref 3.5–5.1)
Sodium: 135 mmol/L (ref 135–145)
Total Bilirubin: 0.4 mg/dL (ref 0.3–1.2)
Total Protein: 5 g/dL — ABNORMAL LOW (ref 6.5–8.1)

## 2021-04-21 LAB — SARS CORONAVIRUS 2 (TAT 6-24 HRS): SARS Coronavirus 2: NEGATIVE

## 2021-04-21 LAB — CBC
HCT: 36.5 % — ABNORMAL LOW (ref 39.0–52.0)
Hemoglobin: 11.7 g/dL — ABNORMAL LOW (ref 13.0–17.0)
MCH: 29.1 pg (ref 26.0–34.0)
MCHC: 32.1 g/dL (ref 30.0–36.0)
MCV: 90.8 fL (ref 80.0–100.0)
Platelets: 192 10*3/uL (ref 150–400)
RBC: 4.02 MIL/uL — ABNORMAL LOW (ref 4.22–5.81)
RDW: 16.2 % — ABNORMAL HIGH (ref 11.5–15.5)
WBC: 6.5 10*3/uL (ref 4.0–10.5)
nRBC: 0 % (ref 0.0–0.2)

## 2021-04-21 LAB — GLUCOSE, CAPILLARY
Glucose-Capillary: 95 mg/dL (ref 70–99)
Glucose-Capillary: 119 mg/dL — ABNORMAL HIGH (ref 70–99)
Glucose-Capillary: 106 mg/dL — ABNORMAL HIGH (ref 70–99)
Glucose-Capillary: 106 mg/dL — ABNORMAL HIGH (ref 70–99)

## 2021-04-21 LAB — HEMOGLOBIN A1C
Hgb A1c MFr Bld: 4.9 % (ref 4.8–5.6)
Mean Plasma Glucose: 93.93 mg/dL

## 2021-04-21 LAB — MAGNESIUM: Magnesium: 1.8 mg/dL (ref 1.7–2.4)

## 2021-04-21 IMAGING — XA IR EXCHANGE BILARY DRAIN
1 series · 3 of 3 positions shown · non-contrast
Comparison: none

INDICATION: 69-year-old male with a history of acute acalculous cholecystitis
(sludge only visualized on ultrasound and MRCP). Patient was a poor
operative candidate and therefore treated with percutaneous
cholecystostomy tube on [DATE]. Subsequently he was unable to
attend scheduled follow-up appointment due to lack of accessed
transportation. He now presents to the hospital with buildup of
debris around the tube entry site and presents interventional
radiology for tube exchange and evaluation.

[Series 300: tube placements · 3 of 3 slices shown]
[im 1/3]
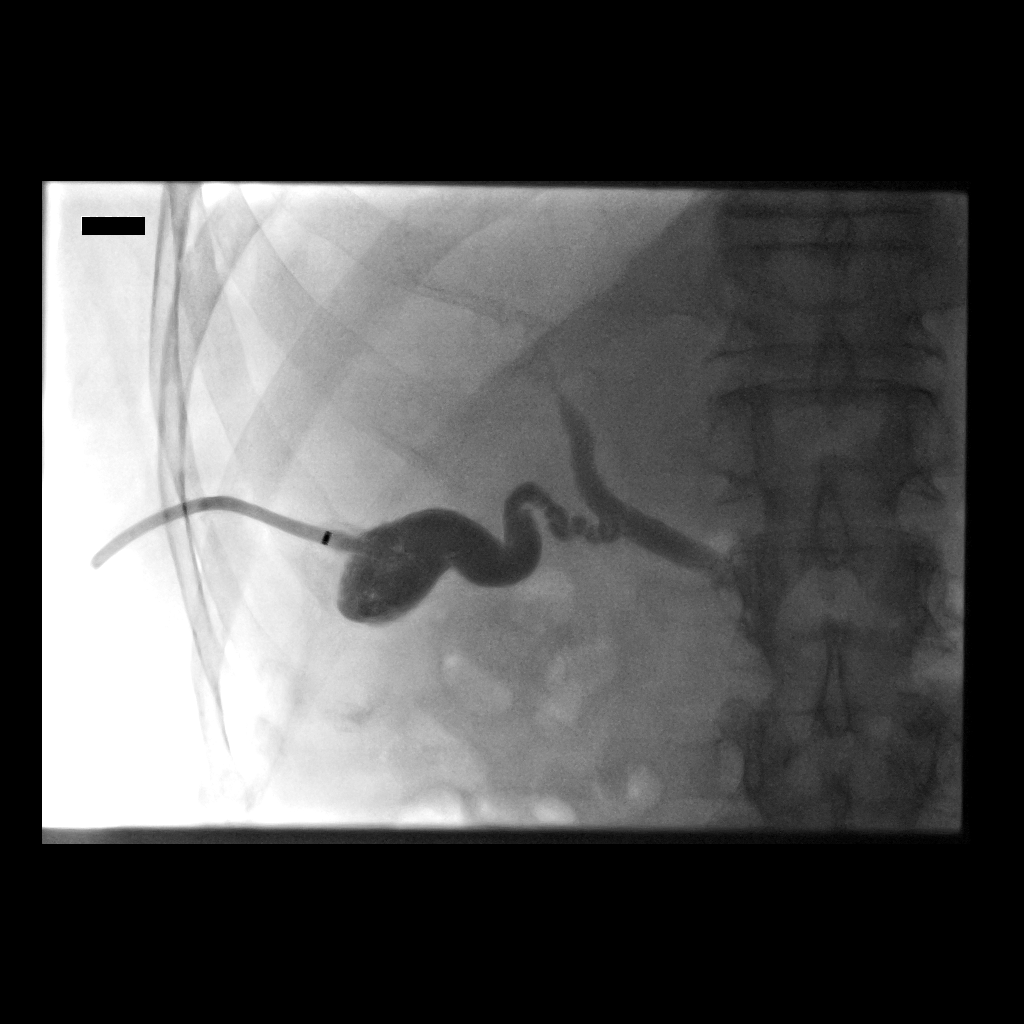
[im 2/3]
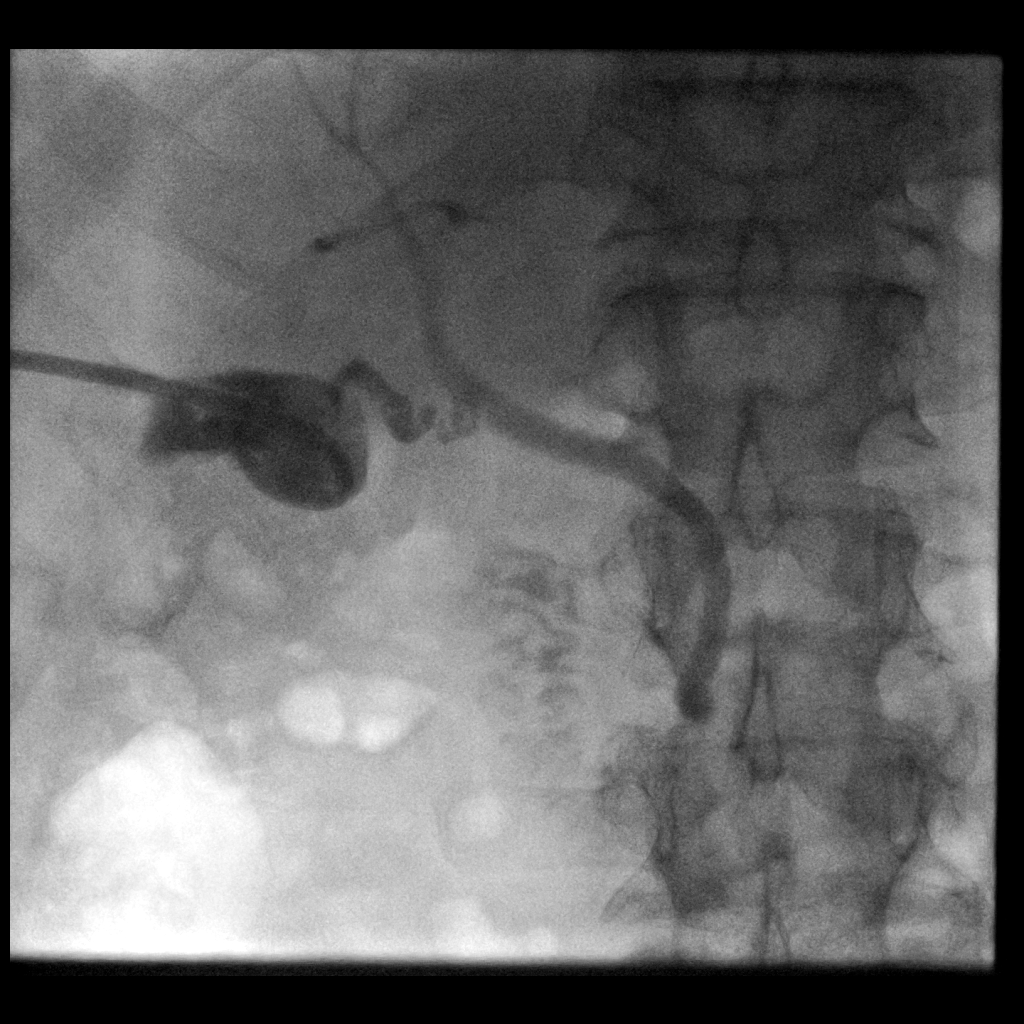
[im 3/3]
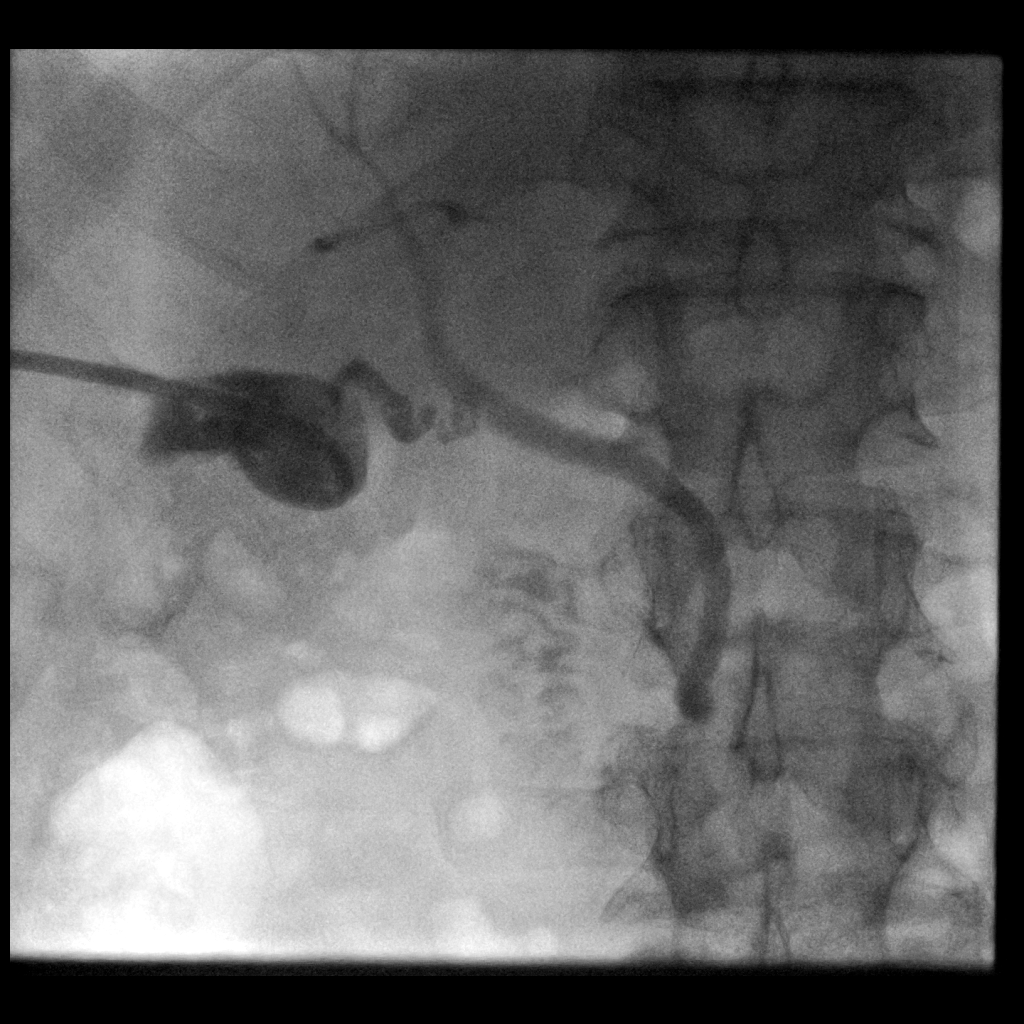

[3 of 3 positions shown; findings below may reference images not displayed]

EXAM:
Cholecystostomy tube exchange

MEDICATIONS:
None

ANESTHESIA/SEDATION:
None

FLUOROSCOPY TIME:  Fluoroscopy Time: 0 minutes 42 seconds (16 mGy).

COMPLICATIONS:
None immediate.

PROCEDURE:
Informed written consent was obtained from the patient after a
thorough discussion of the procedural risks, benefits and
alternatives. All questions were addressed. Maximal Sterile Barrier
Technique was utilized including caps, mask, sterile gowns, sterile
gloves, sterile drape, hand hygiene and skin antiseptic. A timeout
was performed prior to the initiation of the procedure.

Contrast was injected through the existing cholecystostomy tube. The
cystic duct is patent and the common bile duct are patent. No
evidence of choledocholithiasis.

The tube entry site is encrusted with scab and dried bile. Local
anesthesia was attained by infiltration with 1% lidocaine. The
encrustations were carefully removed. The retention suture was cut.
The tube was transected and removed over a Bentson wire. A new 10
French tube was advanced over the wire and formed in the gallbladder
lumen. Contrast was again injected. The biliary tree remains widely
patent. The existing tube was gently flushed and then capped before
being secured to the skin with adhesive fixation.
IMPRESSION: 1. Restored patency of the cystic duct and common bile ducts.
2. Successful exchange for a new 10 French percutaneous
cholecystostomy tube.

PLAN:
1. Given restoration of patency of the cystic duct in the setting of
a calculus cholecystitis, patient is now a candidate for physiologic
capped trial.
2. Drainage catheter was capped. [REDACTED] in 1 week. If he remains asymptomatic, the tube can be
removed at that time.

## 2021-04-21 MED ORDER — LIDOCAINE HCL 1 % IJ SOLN
INTRAMUSCULAR | Status: AC
  Filled 2021-04-21: qty 20

## 2021-04-21 MED ORDER — POTASSIUM CHLORIDE 20 MEQ PO PACK
40.0000 meq | PACK | Freq: Two times a day (BID) | ORAL | Status: AC
  Administered 2021-04-21 (×2): 40 meq via ORAL
  Filled 2021-04-21 (×2): qty 2

## 2021-04-21 MED ORDER — IOHEXOL 300 MG/ML  SOLN
50.0000 mL | Freq: Once | INTRAMUSCULAR | Status: AC | PRN
  Administered 2021-04-21: 10 mL

## 2021-04-21 MED ORDER — ENOXAPARIN SODIUM 40 MG/0.4ML IJ SOSY
40.0000 mg | PREFILLED_SYRINGE | INTRAMUSCULAR | Status: DC
  Administered 2021-04-21 – 2021-04-22 (×2): 40 mg via SUBCUTANEOUS
  Filled 2021-04-21 (×2): qty 0.4

## 2021-04-21 MED ORDER — ORAL CARE MOUTH RINSE
15.0000 mL | Freq: Two times a day (BID) | OROMUCOSAL | Status: DC
  Administered 2021-04-21 (×2): 15 mL via OROMUCOSAL

## 2021-04-21 MED ORDER — PROSOURCE PLUS PO LIQD
30.0000 mL | Freq: Two times a day (BID) | ORAL | Status: DC
  Administered 2021-04-21 – 2021-04-22 (×2): 30 mL via ORAL
  Filled 2021-04-21 (×2): qty 30

## 2021-04-21 MED ORDER — ENSURE ENLIVE PO LIQD
237.0000 mL | ORAL | Status: DC
  Administered 2021-04-22: 237 mL via ORAL

## 2021-04-21 MED ORDER — ADULT MULTIVITAMIN W/MINERALS CH
1.0000 | ORAL_TABLET | Freq: Every day | ORAL | Status: DC
  Administered 2021-04-21 – 2021-04-22 (×2): 1 via ORAL
  Filled 2021-04-21 (×2): qty 1

## 2021-04-21 NOTE — Evaluation (Signed)
Clinical/Bedside Swallow Evaluation Patient Details  Name: Vincent [name] MRN: [id number] Date of Birth: July 06, 1951  Today's Date: 04/21/2021 Time: SLP Start Time (ACUTE ONLY): 31 SLP Stop Time (ACUTE ONLY): 4166 SLP Time Calculation (min) (ACUTE ONLY): 13 min  Past Medical History:  Past Medical History:  Diagnosis Date   Acute MI ([place])    Allergy    Anxiety    Arthritis    Coronary artery disease    Depression    Diabetes mellitus without complication ([place])    Diverticulosis    mild   Hemorrhoids    HOH (hard of hearing)    Hx of colonic polyps    Hyperlipidemia    Hypertension    Left ventricular dysfunction    hx of   Past Surgical History:  Past Surgical History:  Procedure Laterality Date   ANGIOPLASTY     and bare metal stent placement   ANKLE SURGERY Right 1986   i&d abscess- pt states "put a bone in it"   CORONARY ANGIOPLASTY WITH STENT PLACEMENT     IR PERC CHOLECYSTOSTOMY  02/07/2021   KNEE ARTHROPLASTY Right 08/09/2016   Procedure: RIGHT TOTAL KNEE ARTHROPLASTY WITH COMPUTER NAVIGATION;  Surgeon: Rod Can, MD;  Location: [facility];  Service: Orthopedics;  Laterality: Right;  Needs RNFA   LEFT HEART CATH AND CORONARY ANGIOGRAPHY N/A 11/21/2018   Procedure: LEFT HEART CATH AND CORONARY ANGIOGRAPHY;  Surgeon: Burnell Blanks, MD;  Location: [facility];  Service: Cardiovascular;  Laterality: N/A;   HPI:  70 yr old admitted with urinary frequency and to have UTI. CXR negative. Per chart complains of chronic cough; oral intake decreased and losing weight. PMH: DM 2, stroke 4/22 (no ST notes for swallow), CAD, bedbound, UTI's.   Assessment / Plan / Recommendation Clinical Impression  RN reported pt's voice was a "little gurgly this morning but took his pills with liquid fine".Pt did not allow therapist to position upright with encouragement reporting back pain. He is endentulous and denied difficulty masticating with functional oral-motor  abilities. Pt limited intake to several straw sips thin and bite cracker. No concerns were noted with oropharyngeal swallow. Would recommend continue Dys 3 texture while in hospital, thin liquids, pills with liquid. No further ST needed. SLP Visit Diagnosis: Dysphagia, unspecified (R13.10)    Aspiration Risk  Mild aspiration risk    Diet Recommendation Dysphagia 3 (Mech soft);Thin liquid   Liquid Administration via: Cup;Straw Medication Administration: Whole meds with liquid Supervision: Patient able to self feed Compensations: Slow rate;Small sips/bites Postural Changes: Other (Comment) (as tolerated)    Other  Recommendations Oral Care Recommendations: Oral care BID   Follow up Recommendations None      Frequency and Duration            Prognosis        Swallow Study   General Date of Onset: 04/20/21 HPI: 70 yr old admitted with urinary frequency and to have UTI. CXR negative. Per chart complains of chronic cough; oral intake decreased and losing weight. PMH: DM 2, stroke 4/22 (no ST notes for swallow), CAD, bedbound, UTI's. Type of Study: Bedside Swallow Evaluation Previous Swallow Assessment:  (no) Diet Prior to this Study: Dysphagia 3 (soft);Thin liquids Temperature Spikes Noted: No Respiratory Status: Room air History of Recent Intubation: No Behavior/Cognition: Alert;Requires cueing Oral Cavity Assessment: Within Functional Limits Oral Care Completed by SLP: No Oral Cavity - Dentition: Edentulous Vision: Functional for self-feeding Self-Feeding Abilities: Able to feed self Patient Positioning:  Partially reclined (pt refusal for upright) Baseline Vocal Quality: Normal Volitional Cough: Strong Volitional Swallow: Able to elicit    Oral/Motor/Sensory Function Overall Oral Motor/Sensory Function: Within functional limits   Ice Chips     Thin Liquid Thin Liquid: Within functional limits Presentation: Straw    Nectar Thick Nectar Thick Liquid: Not tested   Honey  Thick Honey Thick Liquid: Not tested   Puree Puree: Not tested   Solid     Solid: Within functional limits      Houston Siren 04/21/2021,11:56 AM  Orbie Pyo Colvin Caroli.Ed Risk analyst [phone] Office [phone]

## 2021-04-21 NOTE — Plan of Care (Signed)

## 2021-04-21 NOTE — Progress Notes (Signed)
PT Cancellation Note  Patient Details Name: Vincent [name] MRN: [id number] DOB: 24-Nov-1950   Cancelled Treatment:    Reason Eval/Treat Not Completed: Patient declined, no reason specified Pt tangential and declined mobilizing.  In regards to therapy, pt reports, "well are you gonna come into my home with a pitbull?"  Pt familiar to this PT from admission in May which pt only able to mobilize OOB with stedy and significant assist.  Pt also admitted in June and declined getting OOB for that evaluation.  SNF recommended with both admissions.  Pt currently admitted from home with spouse.   Rachella Basden,KATHrine E 04/21/2021, 3:02 PM Kati PT, DPT Acute Rehabilitation Services Pager: [phone] Office: [phone]

## 2021-04-21 NOTE — Progress Notes (Signed)
Pt refusing telemetry. MD aware. Will continue to monitor for changes.

## 2021-04-21 NOTE — Progress Notes (Signed)
PROGRESS NOTE    Vincent [name]  TZG:[id number] DOB: [birth year] DOA: 04/20/2021 PCP: Ginger Organ., MD   Brief Narrative:  Vincent [name] is a 70 y.o. male with medical history significant of hypertension, diabetes mellitus type 2, unspecified stroke in 01/2021 with residual speech difficulty and being bedbound, chronic combined heart failure, CAD, prior UTIs, depression, anxiety, chronic cholecystitis with current cholecystostomy tube (placed on 02/08/2021 by IR with plans for outpatient follow-up with IR for drain change/removal few weeks ago which could not be done because of transportation problems and possible subsequent cholecystectomy by general surgery) presented with increased urinary frequency. ED Course: He was found to have elevated lactic acid level of 3.3 and the repeat was 3.5.  He was given IV fluids.  UA was consistent with UTI.  He was started on IV antibiotics.  Chest x-ray was negative for infiltrates.  IR and general surgery was consulted on admission.  Assessment & Plan:   UTI: -He has history of recurrent UTI.  On previous admission he had E. coli UTI sensitive to Rocephin. -Continue IV fluids and Rocephin.  Urine culture is pending. -Renal ultrasound is negative for hydronephrosis  Lactic acidosis: Likely due to dehydration due to decreased p.o. intake -Trend lactic acid.  Chronic cholecystitis with indwelling cholecystectomy drain: -Patient missed appointment with IR few weeks ago for drain removal due to transportation issue. -General surgery: Recommended no plans for surgical intervention, follow-up outpatient -DC Flagyl.  Acute metabolic encephalopathy: -Likely in the setting of UTI. -History of ?Stroke with residual dysarthria, poor memory and bedbound status. -consult PT/OT/SLP -Fall precautions.  Palliative care consult -SLP recommended dysphagia 3 diet  Chronic combined systolic and diastolic heart failure: -Patient appears euvolemic on exam.   Ejection fraction 40 to 45% in 04/22. -Strict INO's and daily weight. -Hold Lasix for now  Type 2 diabetes mellitus: -A1c improved from 7.2-4.9. -Continue sliding scale insulin  BPH: Continue Flomax  Hyperlipidemia: Continue simvastatin  Hypokalemia:  -Replace potassium.  Magnesium level: WNL -Repeat BMP tomorrow a.m.  DVT prophylaxis: Lovenox Code Status: DNR Family Communication:  None present at bedside.  Plan of care discussed with patient in length and he verbalized understanding and agreed with it. Disposition Plan: To be determined  Consultants:  General surgery IR  Procedures:  None  Antimicrobials:  Rocephin  Status is: Inpatient  Remains inpatient appropriate because:IV treatments appropriate due to intensity of illness or inability to take PO  Dispo: The patient is from: Home              Anticipated d/c is to: Home              Patient currently is not medically stable to d/c.   Difficult to place patient No         Subjective: Patient seen and examined.  Alert but confused.  Tells me that he is doing fine, no new complaints.  No  urinary symptoms, fever, chills, nausea or vomiting.  No acute events overnight.  Objective: Vitals:   04/20/21 2330 04/21/21 0358 04/21/21 0500 04/21/21 0743  BP: 104/60 106/68  121/71  Pulse: 97 91  98  Resp: 15 12  16   Temp: 98 F (36.7 C) 98.3 F (36.8 C)  98.3 F (36.8 C)  TempSrc: Oral Oral  Oral  SpO2: 96% 100%  98%  Weight:   91 kg     Intake/Output Summary (Last 24 hours) at 04/21/2021 1238 Last data filed at 04/21/2021 7591 Gross  per 24 hour  Intake 3542.69 ml  Output 1900 ml  Net 1642.69 ml   Filed Weights   04/21/21 0500  Weight: 91 kg    Examination:  General exam: Appears calm and comfortable, on room air, Respiratory system: Clear to auscultation. Respiratory effort normal. Cardiovascular system: S1 & S2 heard, RRR. No JVD, murmurs, rubs, gallops or clicks. No pedal  edema. Gastrointestinal system: Abdomen is nondistended, soft and nontender. No organomegaly or masses felt. Normal bowel sounds heard.  Percutaneous cholecystectomy drain noted.  No erythema noted around the site. Central nervous system: Alert and oriented x1.Marland Kitchen Extremities: Symmetric 5 x 5 power. Skin: No rashes, lesions or ulcers Psychiatry: Judgement and insight appear normal. Mood & affect appropriate.    Data Reviewed: I have personally reviewed following labs and imaging studies  CBC: Recent Labs  Lab 04/20/21 1136 04/21/21 0345  WBC 8.0 6.5  NEUTROABS 5.8  --   HGB 15.6 11.7*  HCT 49.3 36.5*  MCV 89.5 90.8  PLT 234 269   Basic Metabolic Panel: Recent Labs  Lab 04/20/21 1136 04/21/21 0345  NA 136 135  K 3.3* 3.4*  CL 100 104  CO2 22 25  GLUCOSE 149* 109*  BUN 8 10  CREATININE 0.61 0.47*  CALCIUM 9.5 8.4*  MG  --  1.8   GFR: Estimated Creatinine Clearance: 95.4 mL/min (A) (by C-G formula based on SCr of 0.47 mg/dL (L)). Liver Function Tests: Recent Labs  Lab 04/20/21 1136 04/21/21 0345  AST 24 21  ALT 13 11  ALKPHOS 146* 104  BILITOT 0.6 0.4  PROT 7.3 5.0*  ALBUMIN 3.9 2.6*   No results for input(s): LIPASE, AMYLASE in the last 168 hours. No results for input(s): AMMONIA in the last 168 hours. Coagulation Profile: No results for input(s): INR, PROTIME in the last 168 hours. Cardiac Enzymes: No results for input(s): CKTOTAL, CKMB, CKMBINDEX, TROPONINI in the last 168 hours. BNP (last 3 results) No results for input(s): PROBNP in the last 8760 hours. HbA1C: Recent Labs    04/21/21 0345  HGBA1C 4.9   CBG: Recent Labs  Lab 04/20/21 1748 04/20/21 1954 04/21/21 0741 04/21/21 1227  GLUCAP 84 109* 95 119*   Lipid Profile: No results for input(s): CHOL, HDL, LDLCALC, TRIG, CHOLHDL, LDLDIRECT in the last 72 hours. Thyroid Function Tests: No results for input(s): TSH, T4TOTAL, FREET4, T3FREE, THYROIDAB in the last 72 hours. Anemia Panel: No  results for input(s): VITAMINB12, FOLATE, FERRITIN, TIBC, IRON, RETICCTPCT in the last 72 hours. Sepsis Labs: Recent Labs  Lab 04/20/21 1141 04/20/21 1504  LATICACIDVEN 3.3* 3.5*    Recent Results (from the past 240 hour(s))  Blood culture (routine x 2)     Status: None (Preliminary result)   Collection Time: 04/20/21  4:26 PM   Specimen: BLOOD LEFT HAND  Result Value Ref Range Status   Specimen Description BLOOD LEFT HAND  Final   Special Requests   Final    BOTTLES DRAWN AEROBIC ONLY Blood Culture adequate volume   Culture   Final    NO GROWTH < 12 HOURS Performed at [facility], [place] [address], [place], [place] [address]    Report Status PENDING  Incomplete  SARS CORONAVIRUS 2 (TAT 6-24 HRS) Nasopharyngeal Nasopharyngeal Swab     Status: None   Collection Time: 04/20/21  5:51 PM   Specimen: Nasopharyngeal Swab  Result Value Ref Range Status   SARS Coronavirus 2 NEGATIVE NEGATIVE Final    Comment: (NOTE) SARS-CoV-2  target nucleic acids are NOT DETECTED.  The SARS-CoV-2 RNA is generally detectable in upper and lower respiratory specimens during the acute phase of infection. Negative results do not preclude SARS-CoV-2 infection, do not rule out co-infections with other pathogens, and should not be used as the sole basis for treatment or other patient management decisions. Negative results must be combined with clinical observations, patient history, and epidemiological information. The expected result is Negative.  Fact Sheet for Patients: SugarRoll.be  Fact Sheet for Healthcare Providers: https://www.woods-mathews.com/  This test is not yet approved or cleared by the [place] FDA and  has been authorized for detection and/or diagnosis of SARS-CoV-2 by FDA under an Emergency Use Authorization (EUA). This EUA will remain  in effect (meaning this test can be used) for the duration of the COVID-19 declaration under Se  ction 564(b)(1) of the Act, 21 U.S.C. section 360bbb-3(b)(1), unless the authorization is terminated or revoked sooner.  Performed at [facility], [place] [address] [place], [place] [address]   Blood culture (routine x 2)     Status: None (Preliminary result)   Collection Time: 04/20/21  8:56 PM   Specimen: BLOOD RIGHT HAND  Result Value Ref Range Status   Specimen Description   Final    BLOOD RIGHT HAND Blood Culture adequate volume BOTTLES DRAWN AEROBIC ONLY Performed at [place] [address] [place], [place] [address]    Special Requests   Final    NONE Performed at [facility], [place] [address] [place], [place] [address]    Culture   Final    NO GROWTH < 12 HOURS Performed at [place] [address], [place], [place] [address]    Report Status PENDING  Incomplete      Radiology Studies: US RENAL  Result Date: 04/20/2021 CLINICAL DATA:  UTI. EXAM: RENAL / URINARY TRACT ULTRASOUND COMPLETE COMPARISON:  Abdomen CT 01/22/2021 FINDINGS: Right Kidney: Renal measurements: 11.6 x 5.3 x 4.6 cm = volume: 148 mL. Exophytic interpolar cyst noted as seen on prior CT. No hydronephrosis. Normal parenchymal echogenicity. Left Kidney: Renal measurements: 12.5 x 5.5 x 5.5 cm = volume: 195 mL. Echogenicity within normal limits. No mass or hydronephrosis visualized. Bladder: Appears normal for degree of bladder distention. Other: None. IMPRESSION: Unremarkable.  No hydronephrosis. Electronically Signed   By: Misty Stanley M.D.   On: 04/20/2021 19:50   DG Chest Port 1 View  Result Date: 04/20/2021 CLINICAL DATA:  Altered mental status EXAM: PORTABLE CHEST 1 VIEW COMPARISON:  03/14/2021 FINDINGS: The heart size and mediastinal contours are within normal limits. Both lungs are clear. No pleural effusion. The visualized skeletal structures are unremarkable. IMPRESSION: No acute process in the chest. Electronically Signed   By: Macy Mis  M.D.   On: 04/20/2021 11:49    Scheduled Meds:  aspirin EC  81 mg Oral QHS   insulin aspart  0-5 Units Subcutaneous QHS   insulin aspart  0-9 Units Subcutaneous TID WC   mouth rinse  15 mL Mouth Rinse BID   potassium chloride  40 mEq Oral BID   simvastatin  40 mg Oral QHS   tamsulosin  0.4 mg Oral QPC supper   traZODone  50 mg Oral QHS   vitamin B-12  1,000 mcg Oral Daily   Continuous Infusions:  cefTRIAXone (ROCEPHIN)  IV     lactated ringers 75 mL/hr at 04/21/21 0420   metronidazole 500 mg (04/21/21 0922)  LOS: 1 day   Time spent: 35 minutes   Yazleemar Strassner Loann Quill, MD [facility]  If 7PM-7AM, please contact night-coverage [email or web address] 04/21/2021, 12:38 PM

## 2021-04-21 NOTE — Progress Notes (Signed)
Initial Nutrition Assessment  DOCUMENTATION CODES:   Non-severe (moderate) malnutrition in context of chronic illness  INTERVENTION:  - will order Ensure Enlive once/day, each supplement provides 350 kcal and 20 grams of protein. - will order 30 ml Prosource Plus BID, each supplement provides 100 kcal and 15 grams protein.  - will order 1 tablet multivitamin with minerals/day.    NUTRITION DIAGNOSIS:   Moderate Malnutrition related to chronic illness (previous stroke now bedbound) as evidenced by mild fat depletion, mild muscle depletion, moderate muscle depletion.  GOAL:   Patient will meet greater than or equal to 90% of their needs  MONITOR:   PO intake, Supplement acceptance, Labs, Weight trends  REASON FOR ASSESSMENT:   Malnutrition Screening Tool  ASSESSMENT:   70 y.o. male with medical history of HTN, type 2 DM, unspecified stroke in 01/2021 with residual speech difficulty and bedbound, CHF, CAD, prior UTIs, depression, anxiety, chronic cholecystitis with current cholecystostomy tube (placed on 02/08/2021 by IR with plans for outpatient follow-up with IR for drain change/removal few weeks ago which could not be done because of transportation problems and possible subsequent cholecystectomy by general surgery). He presented to the ED d/t increased urinary frequency.  No intakes documented since admission.   Patient in bed with no family or visitors present. Patient tells RD that he would like "salmon with cooked vegetables around the plate and a diet Coke for dinner." To all questions asked, patient repeats this statement. Unable to obtain any information from patient at this time.   Called [facility] and dinner order is in place per patient request.   He has not been seen by a [place] RD at any time in the past.   Weight today is 201 lb and weight on 01/17/21 was 207 lb. This indicates 6 lb weight loss (3% body weight) in the past 3 months; not significant for time  frame.   Labs reviewed; CBGs: 95 and 119 mg/dl, K: 3.4 mmol/l, creatinine: 0.47 mg/dl, Ca: 8.4 mg/dl. Medications reviewed; sliding scale novolog, 40 mEq Klor-Con BID, 1000 mcg oral cyanocobalamin/day. IVF; LR @ 75 ml/hr.      NUTRITION - FOCUSED PHYSICAL EXAM:  Flowsheet Row Most Recent Value  Orbital Region Mild depletion  Upper Arm Region Mild depletion  Thoracic and Lumbar Region Unable to assess  Buccal Region Mild depletion  Temple Region No depletion  Clavicle Bone Region Moderate depletion  Clavicle and Acromion Bone Region Moderate depletion  Scapular Bone Region Mild depletion  Dorsal Hand Moderate depletion  Patellar Region Unable to assess  Anterior Thigh Region Unable to assess  Posterior Calf Region Unable to assess  Edema (RD Assessment) Unable to assess  Hair Reviewed  Eyes Reviewed  Mouth Reviewed  Skin Reviewed  Nails Reviewed       Diet Order:   Diet Order             DIET DYS 3 Room service appropriate? Yes; Fluid consistency: Thin; Fluid restriction: 1200 mL Fluid  Diet effective now                   EDUCATION NEEDS:   No education needs have been identified at this time  Skin:  Skin Assessment: Skin Integrity Issues: Skin Integrity Issues:: Stage II Stage II: sacrum  Last BM:  PTA/unknown  Height:   Ht Readings from Last 1 Encounters:  03/14/21 5\' 8"  (1.727 m)    Weight:   Wt Readings from Last 1 Encounters:  04/21/21 91  kg     Estimated Nutritional Needs:  Kcal:  1800-2000 kcal Protein:  90-105 grams Fluid:  >/= 2 L/day     Jarome Matin, MS, RD, LDN, CNSC Inpatient Clinical Dietitian RD pager # available in AMION  After hours/weekend pager # available in [facility]

## 2021-04-21 NOTE — Progress Notes (Signed)
Chief Complaint/Subjective: Admitted for confusion, no abdominal pain, does not endorse anorexia, confusion noted by staff during admission  Review of Systems See above, otherwise other systems negative  Past Medical History:  Diagnosis Date   Acute MI ([place])    Allergy    Anxiety    Arthritis    Coronary artery disease    Depression    Diabetes mellitus without complication (HCC)    Diverticulosis    mild   Hemorrhoids    HOH (hard of hearing)    Hx of colonic polyps    Hyperlipidemia    Hypertension    Left ventricular dysfunction    hx of   Family History  Problem Relation Age of Onset   Coronary artery disease Other        positive family hx of   Colon cancer Neg Hx    Esophageal cancer Neg Hx    Liver cancer Neg Hx    Pancreatic cancer Neg Hx    Rectal cancer Neg Hx    Stomach cancer Neg Hx    Social History   Socioeconomic History   Marital status: Married    Spouse name: Not on file   Number of children: Not on file   Years of education: Not on file   Highest education level: Not on file  Occupational History   Occupation: full time    Employer: [employer]  Tobacco Use   Smoking status: Former    Types: Cigars    Quit date: 12/17/2005    Years since quitting: 15.3   Smokeless tobacco: Former    Quit date: 12/17/2005  Vaping Use   Vaping Use: Never used  Substance and Sexual Activity   Alcohol use: No   Drug use: No   Sexual activity: Not Currently  Other Topics Concern   Not on file  Social History Narrative   Not on file   Social Determinants of Health   Financial Resource Strain: Not on file  Food Insecurity: No Food Insecurity   Worried About Charity fundraiser in the Last Year: Never true   Ran Out of Food in the Last Year: Never true  Transportation Needs: No Transportation Needs   Lack of Transportation (Medical): No   Lack of Transportation (Non-Medical): No  Physical Activity: Not on file  Stress: Not on file   Social Connections: Not on file  Intimate Partner Violence: Not on file   Objective: Vital signs in last 24 hours: Temp:  [98 F (36.7 C)-98.6 F (37 C)] 98.3 F (36.8 C) (07/15 0743) Pulse Rate:  [53-117] 98 (07/15 0743) Resp:  [12-20] 16 (07/15 0743) BP: (82-142)/(57-83) 121/71 (07/15 0743) SpO2:  [94 %-100 %] 98 % (07/15 0743) Weight:  [91 kg] 91 kg (07/15 0500)   Intake/Output from previous day: 07/14 0701 - 07/15 0700 In: 3356.4 [P.O.:180; I.V.:727.5; IV Piggyback:2448.9] Out: 1200 [Urine:1050; Drains:150] Intake/Output this shift: Total I/O In: 186.3 [I.V.:186.3] Out: 700 [Urine:600; Drains:100]  PE: Gen: NAD Resp: nonlabored Card: normal rate Abd: soft, NT, ND, right sided drain with clear bilious output  Lab Results:  Recent Labs    04/20/21 1136 04/21/21 0345  WBC 8.0 6.5  HGB 15.6 11.7*  HCT 49.3 36.5*  PLT 234 192   BMET Recent Labs    04/20/21 1136 04/21/21 0345  NA 136 135  K 3.3* 3.4*  CL 100 104  CO2 22 25  GLUCOSE 149* 109*  BUN 8 10  CREATININE 0.61 0.47*  CALCIUM 9.5 8.4*   PT/INR No results for input(s): LABPROT, INR in the last 72 hours. CMP     Component Value Date/Time   NA 135 04/21/2021 0345   K 3.4 (L) 04/21/2021 0345   CL 104 04/21/2021 0345   CO2 25 04/21/2021 0345   GLUCOSE 109 (H) 04/21/2021 0345   BUN 10 04/21/2021 0345   CREATININE 0.47 (L) 04/21/2021 0345   CALCIUM 8.4 (L) 04/21/2021 0345   PROT 5.0 (L) 04/21/2021 0345   ALBUMIN 2.6 (L) 04/21/2021 0345   AST 21 04/21/2021 0345   ALT 11 04/21/2021 0345   ALKPHOS 104 04/21/2021 0345   BILITOT 0.4 04/21/2021 0345   GFRNONAA >60 04/21/2021 0345   GFRAA >60 08/10/2016 0417   Lipase     Component Value Date/Time   LIPASE 23 03/14/2021 1718    Studies/Results: US RENAL  Result Date: 04/20/2021 CLINICAL DATA:  UTI. EXAM: RENAL / URINARY TRACT ULTRASOUND COMPLETE COMPARISON:  Abdomen CT 01/22/2021 FINDINGS: Right Kidney: Renal measurements: 11.6 x 5.3 x 4.6  cm = volume: 148 mL. Exophytic interpolar cyst noted as seen on prior CT. No hydronephrosis. Normal parenchymal echogenicity. Left Kidney: Renal measurements: 12.5 x 5.5 x 5.5 cm = volume: 195 mL. Echogenicity within normal limits. No mass or hydronephrosis visualized. Bladder: Appears normal for degree of bladder distention. Other: None. IMPRESSION: Unremarkable.  No hydronephrosis. Electronically Signed   By: Misty Stanley M.D.   On: 04/20/2021 19:50   DG Chest Port 1 View  Result Date: 04/20/2021 CLINICAL DATA:  Altered mental status EXAM: PORTABLE CHEST 1 VIEW COMPARISON:  03/14/2021 FINDINGS: The heart size and mediastinal contours are within normal limits. Both lungs are clear. No pleural effusion. The visualized skeletal structures are unremarkable. IMPRESSION: No acute process in the chest. Electronically Signed   By: Macy Mis M.D.   On: 04/20/2021 11:49    Anti-infectives: Anti-infectives (From admission, onward)    Start     Dose/Rate Route Frequency Ordered Stop   04/21/21 1500  cefTRIAXone (ROCEPHIN) 1 g in sodium chloride 0.9 % 100 mL IVPB        1 g 200 mL/hr over 30 Minutes Intravenous Every 24 hours 04/20/21 1703     04/20/21 1800  metroNIDAZOLE (FLAGYL) IVPB 500 mg        500 mg 100 mL/hr over 60 Minutes Intravenous Every 8 hours 04/20/21 1703     04/20/21 1645  metroNIDAZOLE (FLAGYL) IVPB 500 mg  Status:  Discontinued        500 mg 100 mL/hr over 60 Minutes Intravenous  Once 04/20/21 1644 04/20/21 1709   04/20/21 1500  cefTRIAXone (ROCEPHIN) 1 g in sodium chloride 0.9 % 100 mL IVPB        1 g 200 mL/hr over 30 Minutes Intravenous  Once 04/20/21 1454 04/20/21 1600      Assessment/Plan 70 yo male with history of cholecystitis vs choledocholithiasis. He is admitted for urosepsis with confusion as well as some failure to thrive symptoms of anorexia, bed bound. Due to these findings, drain management may be prolonged until his neuro and general health issues stabilize. He  still needs to see cardiology for optimization.  Perc cholecystostomy - continue drain management, specifically would not recommend removal of tube, recommend outpatient follow up with cardiology Protein calorie malnutrition, severe - weight 105 kg during May hospitalization, weighing 91 kg during this hospitalization. May benefit from dietician consult UTI - abx per TRH Confusion/dysarthria - forming  sentences today, CT without acute stroke findings but showing brain atrophy FEN - dysphagia diet III VTE - SCDs ID - ceftriaxone Disposition - no plans for surgical intervention, follow up as outpatient   LOS: 1 day   [facility] 04/21/2021, 9:36 AM Please see Amion for pager number during day hours 7:00am-4:30pm or 7:00am -11:30am on weekends

## 2021-04-22 DIAGNOSIS — N3000 Acute cystitis without hematuria: Secondary | ICD-10-CM

## 2021-04-22 LAB — GLUCOSE, CAPILLARY
Glucose-Capillary: 94 mg/dL (ref 70–99)
Glucose-Capillary: 121 mg/dL — ABNORMAL HIGH (ref 70–99)
Glucose-Capillary: 113 mg/dL — ABNORMAL HIGH (ref 70–99)

## 2021-04-22 LAB — CBC WITH DIFFERENTIAL/PLATELET
Abs Immature Granulocytes: 0.02 10*3/uL (ref 0.00–0.07)
Basophils Absolute: 0 10*3/uL (ref 0.0–0.1)
Basophils Relative: 0 %
Eosinophils Absolute: 0.1 10*3/uL (ref 0.0–0.5)
Eosinophils Relative: 2 %
HCT: 34.7 % — ABNORMAL LOW (ref 39.0–52.0)
Hemoglobin: 11.2 g/dL — ABNORMAL LOW (ref 13.0–17.0)
Immature Granulocytes: 0 %
Lymphocytes Relative: 28 %
Lymphs Abs: 1.4 10*3/uL (ref 0.7–4.0)
MCH: 28.9 pg (ref 26.0–34.0)
MCHC: 32.3 g/dL (ref 30.0–36.0)
MCV: 89.4 fL (ref 80.0–100.0)
Monocytes Absolute: 0.7 10*3/uL (ref 0.1–1.0)
Monocytes Relative: 14 %
Neutro Abs: 2.8 10*3/uL (ref 1.7–7.7)
Neutrophils Relative %: 56 %
Platelets: 176 10*3/uL (ref 150–400)
RBC: 3.88 MIL/uL — ABNORMAL LOW (ref 4.22–5.81)
RDW: 16.2 % — ABNORMAL HIGH (ref 11.5–15.5)
WBC: 5.1 10*3/uL (ref 4.0–10.5)
nRBC: 0 % (ref 0.0–0.2)

## 2021-04-22 LAB — BASIC METABOLIC PANEL
Anion gap: 6 (ref 5–15)
BUN: 9 mg/dL (ref 8–23)
CO2: 25 mmol/L (ref 22–32)
Calcium: 8.4 mg/dL — ABNORMAL LOW (ref 8.9–10.3)
Chloride: 105 mmol/L (ref 98–111)
Creatinine, Ser: 0.46 mg/dL — ABNORMAL LOW (ref 0.61–1.24)
GFR, Estimated: >60 mL/min (ref >60)
Glucose, Bld: 102 mg/dL — ABNORMAL HIGH (ref 70–99)
Potassium: 4 mmol/L (ref 3.5–5.1)
Sodium: 136 mmol/L (ref 135–145)

## 2021-04-22 MED ORDER — CEPHALEXIN 500 MG PO CAPS: 500.0000 mg | ORAL_CAPSULE | Freq: Three times a day (TID) | ORAL | 0 refills | Status: AC

## 2021-04-22 NOTE — Progress Notes (Signed)
Palliative care brief note  Palliative care consult received.  Vincent [name] is discharging, and I signed his DNR to facilitate transport.  At this point, I believe that he would be best served by outpatient palliative care follow-up.  Micheline Rough, MD [place] [phone]

## 2021-04-22 NOTE — Discharge Instructions (Signed)

## 2021-04-22 NOTE — TOC Transition Note (Signed)
Transition of Care [facility]) - CM/SW Discharge Note  Patient Details  Name: Vincent [name] MRN: [id number] Date of Birth: 1951-02-28  [facility] CM/SW Contact:  Sherie Don, LCSW Phone Number: 04/22/2021, 3:31 PM  Clinical Narrative: TOC received consult for HH/DME. CSW spoke with patient's wife. Per wife, patient is bedbound at baseline and does not have any DME/HH needs at this time. CSW scheduled PTAR. TOC signing off.  Final next level of care: Home/Self Care Barriers to Discharge: No Barriers Identified  Patient Goals and CMS Choice Choice offered to / list presented to : NA  Discharge Plan and Services         DME Arranged: N/A DME Agency: NA  Readmission Risk Interventions No flowsheet data found.

## 2021-04-22 NOTE — Discharge Summary (Addendum)
Physician Discharge Summary  Vincent [name] MRN:[id number] DOB: 03/07/1951 DOA: 04/20/2021  PCP: Shaw, William D Jr., MD  Admit date: 04/20/2021 Discharge date: 04/22/2021  Admitted From: Home Disposition:  Home  Recommendations for Outpatient Follow-up:  Follow up with PCP in 1 week Follow up with general surgery for care of the cholecystectomy drain. Please obtain BMP/CBC in 1 week to follow up on potassium and mild anemia.  Please follow up on the following pending results: final blood cultures. Please follow up on volume status as Lasix was held. Please follow up on functional status as SNF was declined.   Home Health: PT  Equipment/Devices: none SNF was rec'd but the patient's wife states he is at his baseline and declines   Discharge Condition: Fair CODE STATUS: DNR  Diet recommendation: Heart healthy  Brief/Interim Summary: From H&P by Dr. Kshitiz Alekh: "Vincent [name] is a 69 y.o. male with medical history significant of hypertension, diabetes mellitus type 2, unspecified stroke in 01/2021 with residual speech difficulty and being bedbound, chronic combined heart failure, CAD, prior UTIs, depression, anxiety, chronic cholecystitis with current cholecystostomy tube (placed on 02/08/2021 by IR with plans for outpatient follow-up with IR for drain change/removal few weeks ago which could not be done because of transportation problems and possible subsequent cholecystectomy by general surgery) presented with increased urinary frequency.  Patient is a poor historian because of his speech issues and does not participate in conversation much.  I have reviewed patient's medical records myself and have spoken to ER provider.  Patient apparently has been having increasing urination and ongoing straining with urination but has gotten worse over the last few days.  His oral intake has decreased and he is losing weight.  She also complains of chronic cough but has not gotten worse and she has not  noticed any choking when he eats.  Wife states that the gallbladder drain area on the skin looks more inflamed and is more painful to the patient apparently.  Drain is still producing significant amount of clear liquid.  No fever or vomiting noted.  Patient has been more confused over the last 24 hours.  No loss of consciousness, seizures reported.  He was found to have elevated lactic acid level of 3.3 and the repeat was 3.5.  He was given IV fluids.  UA was consistent with UTI.  He was started on IV antibiotics.  Chest x-ray was negative for infiltrates. Hospitalist service was called to evaluate the patient."  Interim: General surg saw the patient and did not feel he met the need for any inpatient procedure from them. They will manage his chronic drain outpatient. IR consulted and replaced cholecystectomy tube. He steadily improved on [place] and fluids back to his baseline.   Subjective on day of discharge: SNF rec'd, pt and wife politely declined as he is near his baseline. She wished to take him home. He is feeling much better. Eating and drinking normally, no urinary complaints.   Discharge Diagnoses:  Principal Problem:   UTI (urinary tract infection) Active Problems:   HLD (hyperlipidemia)   Hypertension   CORONARY ATHEROSCLEROSIS NATIVE CORONARY ARTERY   Chronic systolic CHF (congestive heart failure) (HCC)   Chronic cholecystitis   Pressure injury of skin   UTI: -He has history of recurrent UTI.  On previous admission he had E. coli UTI sensitive to Rocephin. -improved w IV fluids and Rocephin.  Urine culture sensitivity pending, but shows >100k CFU's E coli.  -Renal ultrasound is negative   for hydronephrosis -Sent home on 5 d of Keflex 500 mg TID   Lactic acidosis: Likely due to dehydration due to decreased p.o. intake -resolved   Chronic cholecystitis with indwelling cholecystectomy drain: -Replaced while inpt -General surgery: F/u outpt   Acute metabolic  encephalopathy: -Resolved; Likely in the setting of UTI. -Fall precautions.  Palliative care consult -SLP recommended dysphagia 3 diet   Chronic combined systolic and diastolic heart failure: -Patient appears euvolemic on exam.  Ejection fraction 40 to 45% in 04/22. -Strict INO's and daily weight. -Hold Lasix for now; follow up in the clinic   Type 2 diabetes mellitus: -A1c improved from 7.2-4.9. -Continue sliding scale insulin -Resume home regimen   BPH: Continue Flomax   Hyperlipidemia: Continue simvastatin   Hypokalemia:  -Resolved   In agreement with assessment of the pressure ulcer as below:  Pressure Injury 04/20/21 Sacrum Right;Left Stage 2 -  Partial thickness loss of dermis presenting as a shallow open injury with a red, pink wound bed without slough. (Active)  04/20/21 2000  Location: Sacrum  Location Orientation: Right;Left  Staging: Stage 2 -  Partial thickness loss of dermis presenting as a shallow open injury with a red, pink wound bed without slough.  Wound Description (Comments):   Present on Admission: Yes    Nutrition Problem: Moderate Malnutrition Etiology: chronic illness (previous stroke now bedbound)  Discharge Instructions  Discharge Instructions     Call MD for:  redness, tenderness, or signs of infection (pain, swelling, redness, odor or green/yellow discharge around incision site)   Complete by: As directed    Call MD for:  severe uncontrolled pain   Complete by: As directed    Call MD for:  temperature >100.4   Complete by: As directed    Diet - low sodium heart healthy   Complete by: As directed    Increase activity slowly   Complete by: As directed    Leave dressing on - Keep it clean, dry, and intact until clinic visit   Complete by: As directed       Allergies as of 04/22/2021       Reactions   Penicillins Other (See Comments)   Disputed in 2022- Has tolerated cephalexin in the past  Has patient had a PCN reaction causing  immediate rash, facial/tongue/throat swelling, SOB or lightheadedness with hypotension: Unknown Has patient had a PCN reaction causing severe rash involving mucus membranes or skin necrosis: Unknown Has patient had a PCN reaction that required hospitalization: Unknown Has patient had a PCN reaction occurring within the last 10 years: No        Medication List     STOP taking these medications    docusate sodium 100 MG capsule Commonly known as: COLACE   furosemide 40 MG tablet Commonly known as: LASIX   Resource ThickenUp Clear Powd   thiamine 100 MG tablet   Zinc Oxide 12.8 % ointment Commonly known as: TRIPLE PASTE       TAKE these medications    aspirin EC 81 MG tablet Take 1 tablet (81 mg total) by mouth at bedtime. Notes to patient: 7/16   cephALEXin 500 MG capsule Commonly known as: KEFLEX Take 1 capsule (500 mg total) by mouth 3 (three) times daily for 5 days. Notes to patient: Start tomorrow morning (7/17)   LORazepam 1 MG tablet Commonly known as: ATIVAN Take 1 mg by mouth every 8 (eight) hours as needed for anxiety.   Mag-Oxide 200 MG Tabs Generic drug: Magnesium   Oxide Take 1 tablet (200 mg total) by mouth daily at 12 noon. Notes to patient: 7/17   metFORMIN 1000 MG tablet Commonly known as: GLUCOPHAGE Take 1,000 mg by mouth 2 (two) times daily. Notes to patient: 7/16   pantoprazole 40 MG tablet Commonly known as: PROTONIX Take 1 tablet (40 mg total) by mouth daily. Notes to patient: 7/17   QUEtiapine 25 MG tablet Commonly known as: SEROQUEL Take 1 tablet (25 mg total) by mouth at bedtime. Notes to patient: 7/16   simvastatin 40 MG tablet Commonly known as: ZOCOR Take 1 tablet (40 mg total) by mouth at bedtime. Notes to patient: 7/16   tamsulosin 0.4 MG Caps capsule Commonly known as: FLOMAX Take 1 capsule (0.4 mg total) by mouth daily. Notes to patient: 7/16   traZODone 50 MG tablet Commonly known as: DESYREL Take 50 mg by mouth  at bedtime. Notes to patient: 7/16   vitamin B-12 1000 MCG tablet Commonly known as: CYANOCOBALAMIN Take 1 tablet (1,000 mcg total) by mouth daily. Notes to patient: 7/16               Discharge Care Instructions  (From admission, onward)           Start     Ordered   04/22/21 0000  Leave dressing on - Keep it clean, dry, and intact until clinic visit        04/22/21 1343            Allergies  Allergen Reactions   Penicillins Other (See Comments)    Disputed in 2022- Has tolerated cephalexin in the past  Has patient had a PCN reaction causing immediate rash, facial/tongue/throat swelling, SOB or lightheadedness with hypotension: Unknown Has patient had a PCN reaction causing severe rash involving mucus membranes or skin necrosis: Unknown Has patient had a PCN reaction that required hospitalization: Unknown Has patient had a PCN reaction occurring within the last 10 years: No      Consultations: IR Gen Surg   Procedures/Studies: US RENAL  Result Date: 04/20/2021 CLINICAL DATA:  UTI. EXAM: RENAL / URINARY TRACT ULTRASOUND COMPLETE COMPARISON:  Abdomen CT 01/22/2021 FINDINGS: Right Kidney: Renal measurements: 11.6 x 5.3 x 4.6 cm = volume: 148 mL. Exophytic interpolar cyst noted as seen on prior CT. No hydronephrosis. Normal parenchymal echogenicity. Left Kidney: Renal measurements: 12.5 x 5.5 x 5.5 cm = volume: 195 mL. Echogenicity within normal limits. No mass or hydronephrosis visualized. Bladder: Appears normal for degree of bladder distention. Other: None. IMPRESSION: Unremarkable.  No hydronephrosis. Electronically Signed   By: Eric  Mansell M.D.   On: 04/20/2021 19:50   DG Chest Port 1 View  Result Date: 04/20/2021 CLINICAL DATA:  Altered mental status EXAM: PORTABLE CHEST 1 VIEW COMPARISON:  03/14/2021 FINDINGS: The heart size and mediastinal contours are within normal limits. Both lungs are clear. No pleural effusion. The visualized skeletal structures  are unremarkable. IMPRESSION: No acute process in the chest. Electronically Signed   By: Praneil  Patel M.D.   On: 04/20/2021 11:49   IR EXCHANGE BILIARY DRAIN  Result Date: 04/21/2021 INDICATION: 69-year-old male with a history of acute acalculous cholecystitis (sludge only visualized on ultrasound and MRCP). Patient was a poor operative candidate and therefore treated with percutaneous cholecystostomy tube on 02/07/2021. Subsequently he was unable to attend scheduled follow-up appointment due to lack of accessed transportation. He now presents to the hospital with buildup of debris around the tube entry site and presents interventional radiology for tube exchange   and evaluation. EXAM: Cholecystostomy tube exchange MEDICATIONS: None ANESTHESIA/SEDATION: None FLUOROSCOPY TIME:  Fluoroscopy Time: 0 minutes 42 seconds (16 mGy). COMPLICATIONS: None immediate. PROCEDURE: Informed written consent was obtained from the patient after a thorough discussion of the procedural risks, benefits and alternatives. All questions were addressed. Maximal Sterile Barrier Technique was utilized including caps, mask, sterile gowns, sterile gloves, sterile drape, hand hygiene and skin antiseptic. A timeout was performed prior to the initiation of the procedure. Contrast was injected through the existing cholecystostomy tube. The cystic duct is patent and the common bile duct are patent. No evidence of choledocholithiasis. The tube entry site is encrusted with scab and dried bile. Local anesthesia was attained by infiltration with 1% lidocaine. The encrustations were carefully removed. The retention suture was cut. The tube was transected and removed over a Bentson wire. A new 10 French tube was advanced over the wire and formed in the gallbladder lumen. Contrast was again injected. The biliary tree remains widely patent. The existing tube was gently flushed and then capped before being secured to the skin with adhesive fixation.  IMPRESSION: 1. Restored patency of the cystic duct and common bile ducts. 2. Successful exchange for a new 10 French percutaneous cholecystostomy tube. PLAN: 1. Given restoration of patency of the cystic duct in the setting of a calculus cholecystitis, patient is now a candidate for physiologic capped trial. 2. Drainage catheter was capped. Patient will return Interventional Radiology in 1 week. If he remains asymptomatic, the tube can be removed at that time. Electronically Signed   By: Jacqulynn Cadet M.D.   On: 04/21/2021 17:15      Discharge Exam: Vitals:   04/22/21 0413 04/22/21 1246  BP: (!) 94/53 (!) 108/58  Pulse: 89 89  Resp: 18 (!) 22  Temp: 98 F (36.7 C) 98.6 F (37 C)  SpO2: 98% 96%     General: Pt is alert, not in acute distress Cardiovascular: RRR, S1/S2 + Respiratory: CTA bilaterally, no wheezing, no rhonchi, no respiratory distress, no conversational dyspnea  Abdominal: Soft, NT, ND, bowel sounds + Extremities: no edema, no cyanosis Psych: Normal mood and affect    The results of significant diagnostics from this hospitalization (including imaging, microbiology, ancillary and laboratory) are listed below for reference.     Microbiology: Recent Results (from the past 240 hour(s))  Urine Culture     Status: Abnormal (Preliminary result)   Collection Time: 04/20/21  3:04 PM   Specimen: In/Out Cath Urine  Result Value Ref Range Status   Specimen Description   Final    IN/OUT CATH URINE Performed at [facility] [address], [place], [place] [address]    Special Requests   Final    NONE Performed at [facility], [place] [address] [place], [place] [address]    Culture >=100,000 COLONIES/mL ESCHERICHIA COLI (A)  Final   Report Status PENDING  Incomplete  Blood culture (routine x 2)     Status: None (Preliminary result)   Collection Time: 04/20/21  4:26 PM   Specimen: BLOOD LEFT HAND  Result Value Ref Range Status    Specimen Description BLOOD LEFT HAND  Final   Special Requests   Final    BOTTLES DRAWN AEROBIC ONLY Blood Culture adequate volume   Culture   Final    NO GROWTH < 12 HOURS Performed at [facility], [place] [address], [place], [place] [address]    Report Status PENDING  Incomplete  SARS CORONAVIRUS  2 (TAT 6-24 HRS) Nasopharyngeal Nasopharyngeal Swab     Status: None   Collection Time: 04/20/21  5:51 PM   Specimen: Nasopharyngeal Swab  Result Value Ref Range Status   SARS Coronavirus 2 NEGATIVE NEGATIVE Final    Comment: (NOTE) SARS-CoV-2 target nucleic acids are NOT DETECTED.  The SARS-CoV-2 RNA is generally detectable in upper and lower respiratory specimens during the acute phase of infection. Negative results do not preclude SARS-CoV-2 infection, do not rule out co-infections with other pathogens, and should not be used as the sole basis for treatment or other patient management decisions. Negative results must be combined with clinical observations, patient history, and epidemiological information. The expected result is Negative.  Fact Sheet for Patients: SugarRoll.be  Fact Sheet for Healthcare Providers: https://www.woods-mathews.com/  This test is not yet approved or cleared by the [place] FDA and  has been authorized for detection and/or diagnosis of SARS-CoV-2 by FDA under an Emergency Use Authorization (EUA). This EUA will remain  in effect (meaning this test can be used) for the duration of the COVID-19 declaration under Se ction 564(b)(1) of the Act, 21 U.S.C. section 360bbb-3(b)(1), unless the authorization is terminated or revoked sooner.  Performed at [facility], [place] [address], [place], [place] [address]   Blood culture (routine x 2)     Status: None (Preliminary result)   Collection Time: 04/20/21  8:56 PM   Specimen: BLOOD RIGHT HAND  Result Value Ref Range Status   Specimen Description   Final     BLOOD RIGHT HAND Blood Culture adequate volume BOTTLES DRAWN AEROBIC ONLY Performed at [facility] [address], [place], [place] [address]    Special Requests   Final    NONE Performed at [facility], [place] [address], [place], [place] [address]    Culture   Final    NO GROWTH < 12 HOURS Performed at [facility] [address], [place], [place] [address]    Report Status PENDING  Incomplete     Labs: BNP (last 3 results) Recent Labs    01/24/21 0353 01/25/21 0514 01/26/21 0238  BNP 242.2* 43.6 00.9   Basic Metabolic Panel: Recent Labs  Lab 04/20/21 1136 04/21/21 0345 04/22/21 0219  NA 136 135 136  K 3.3* 3.4* 4.0  CL 100 104 105  CO2 _0 GLUCOSE 149* 109* 102*  BUN _1 CREATININE 0.61 0.47* 0.46*  CALCIUM 9.5 8.4* 8.4*  MG  --  1.8  --    Liver Function Tests: Recent Labs  Lab 04/20/21 1136 04/21/21 0345  AST 24 21  ALT 13 11  ALKPHOS 146* 104  BILITOT 0.6 0.4  PROT 7.3 5.0*  ALBUMIN 3.9 2.6*   CBC: Recent Labs  Lab 04/20/21 1136 04/21/21 0345 04/22/21 0219  WBC 8.0 6.5 5.1  NEUTROABS 5.8  --  2.8  HGB 15.6 11.7* 11.2*  HCT 49.3 36.5* 34.7*  MCV 89.5 90.8 89.4  PLT 234 192 176    CBG: Recent Labs  Lab 04/21/21 1227 04/21/21 1658 04/21/21 2118 04/22/21 0735 04/22/21 1115  GLUCAP 119* 106* 106* 94 121*   Hgb A1c Recent Labs    04/21/21 0345  HGBA1C 4.9   Lipid Profile No results for input(s): CHOL, HDL, LDLCALC, TRIG, CHOLHDL, LDLDIRECT in the last 72 hours. Thyroid function studies No results for input(s): TSH, T4TOTAL, T3FREE, THYROIDAB in the last 72 hours.  Invalid input(s): FREET3 Anemia  work up No results for input(s): VITAMINB12, FOLATE, FERRITIN, TIBC, IRON, RETICCTPCT in the last 72 hours. Urinalysis    Component Value Date/Time   COLORURINE YELLOW 04/20/2021 1313   APPEARANCEUR HAZY (A) 04/20/2021 1313   LABSPEC 1.005 04/20/2021 1313   PHURINE 6.0  04/20/2021 1313   GLUCOSEU NEGATIVE 04/20/2021 1313   HGBUR SMALL (A) 04/20/2021 1313   BILIRUBINUR NEGATIVE 04/20/2021 1313   KETONESUR NEGATIVE 04/20/2021 1313   PROTEINUR NEGATIVE 04/20/2021 1313   UROBILINOGEN 0.2 01/06/2014 1257   NITRITE NEGATIVE 04/20/2021 1313   LEUKOCYTESUR LARGE (A) 04/20/2021 1313   Microbiology Recent Results (from the past 240 hour(s))  Urine Culture     Status: Abnormal (Preliminary result)   Collection Time: 04/20/21  3:04 PM   Specimen: In/Out Cath Urine  Result Value Ref Range Status   Specimen Description   Final    IN/OUT CATH URINE Performed at [facility], [place] [address], [place], [place] [address]    Special Requests   Final    NONE Performed at [facility], [place] [address], [place], [place] [address]    Culture >=100,000 COLONIES/mL ESCHERICHIA COLI (A)  Final   Report Status PENDING  Incomplete  Blood culture (routine x 2)     Status: None (Preliminary result)   Collection Time: 04/20/21  4:26 PM   Specimen: BLOOD LEFT HAND  Result Value Ref Range Status   Specimen Description BLOOD LEFT HAND  Final   Special Requests   Final    BOTTLES DRAWN AEROBIC ONLY Blood Culture adequate volume   Culture   Final    NO GROWTH < 12 HOURS Performed at [facility], [place] [address], [place], [place] [address]    Report Status PENDING  Incomplete  SARS CORONAVIRUS 2 (TAT 6-24 HRS) Nasopharyngeal Nasopharyngeal Swab     Status: None   Collection Time: 04/20/21  5:51 PM   Specimen: Nasopharyngeal Swab  Result Value Ref Range Status   SARS Coronavirus 2 NEGATIVE NEGATIVE Final    Comment: (NOTE) SARS-CoV-2 target nucleic acids are NOT DETECTED.  The SARS-CoV-2 RNA is generally detectable in upper and lower respiratory specimens during the acute phase of infection. Negative results do not preclude SARS-CoV-2 infection, do not rule out co-infections with other pathogens, and should not be used as  the sole basis for treatment or other patient management decisions. Negative results must be combined with clinical observations, patient history, and epidemiological information. The expected result is Negative.  Fact Sheet for Patients: SugarRoll.be  Fact Sheet for Healthcare Providers: https://www.woods-mathews.com/  This test is not yet approved or cleared by the [place] FDA and  has been authorized for detection and/or diagnosis of SARS-CoV-2 by FDA under an Emergency Use Authorization (EUA). This EUA will remain  in effect (meaning this test can be used) for the duration of the COVID-19 declaration under Se ction 564(b)(1) of the Act, 21 U.S.C. section 360bbb-3(b)(1), unless the authorization is terminated or revoked sooner.  Performed at [facility], [place] [address], [place], [place] [address]   Blood culture (routine x 2)     Status: None (Preliminary result)   Collection Time: 04/20/21  8:56 PM   Specimen: BLOOD RIGHT HAND  Result Value Ref Range Status   Specimen Description   Final    BLOOD RIGHT HAND Blood Culture adequate volume BOTTLES DRAWN AEROBIC ONLY Performed at [facility] [address], [place], [place] [address]  Special Requests   Final    NONE Performed at [facility], [address] [place], [place] [address]    Culture   Final    NO GROWTH < 12 HOURS Performed at [facility], [address] ,  [address]    Report Status PENDING  Incomplete     Patient was seen and examined on the day of discharge and was found to be in stable condition. Time coordinating discharge: 35 minutes including assessment and coordination of care, as well as examination of the patient.   SIGNED:   Paul , DO [facility] 04/22/2021, 3:25 PM    

## 2021-04-22 NOTE — Plan of Care (Signed)
Patient awaiting PTAR.

## 2021-04-22 NOTE — Progress Notes (Signed)
Discharge instructions reviewed with wife, [name] over the phone.  Informed Keflex was at [facility].  Patient will be transported home via [employer].

## 2021-04-22 NOTE — Progress Notes (Signed)
Patient ID: Vincent [name], male   DOB: 01/13/1951, 70 y.o.   MRN: [id number] Pt's GB drain exchanged and capped yesterday; site appears ok today; no leaking noted; WBC nl; hgb stable; per Dr. Katrinka Blazing procedural note yesterday:   IMPRESSION: 1. Restored patency of the cystic duct and common bile ducts. 2. Successful exchange for a new 10 French percutaneous cholecystostomy tube.   PLAN: 1. Given restoration of patency of the cystic duct in the setting of a calculus cholecystitis, patient is now a candidate for physiologic capped trial. 2. Drainage catheter was capped. Patient will return Interventional Radiology in 1 week. If he remains asymptomatic, the tube can be removed at that time.   Order placed for f/u cholangiogram and possible drain removal in 1 week at [facility] Our service will contact pt with date/time of appt. Recommend periodic dressing changes to drain site until then.

## 2021-04-22 NOTE — Evaluation (Addendum)
Physical Therapy Evaluation Patient Details Name: Vincent [name] MRN: [id number] DOB: 11-17-1950 Today's Date: 04/22/2021   History of Present Illness  Vincent [name] is a 70 y.o. male presented with increased urinary frequency; pt is s/p biliary drain exchange 04/21/21. PMH: CAD, diabetes mellitus, depression, anxiety, HTN, MI, CHF, unspecified stroke 01/2021, R TKA 2017, chole 02/07/21, chole tube placed 02/08/21.  Clinical Impression  Pt admitted with above diagnosis. Pt limited during eval, refusing to sit EOB or try to stand. Pt pulls self into upper trunk sidelying towards L bedrail, but declines therapist assisting with lower body rotation or rolling into R sidelying. Pt with active BLE movement, R hip more limited than L, and positions L LE in hip external rotation but able to come to midline with cues. Pt states being bedbound at baseline, stays in bed and denies transferring to chair during day. Pt unclear about home set up, states spouse assists with everything, and she got "mad as hell" at the hospital bed delivery people so they didn't keep it. Educated pt on improving participation with therapy to improve functional status, will continue to follow to assess home DME needs and progress strength with mobility, if pt willing to participate.  Pt currently with functional limitations due to the deficits listed below (see PT Problem List). Pt will benefit from skilled PT to increase their independence and safety with mobility to allow discharge to the venue listed below.       Follow Up Recommendations SNF;Supervision/Assistance - 24 hour    Equipment Recommendations  Other (comment) (TBD)    Recommendations for Other Services       Precautions / Restrictions Precautions Precautions: Fall Restrictions Weight Bearing Restrictions: No      Mobility  Bed Mobility Overal bed mobility: Needs Assistance Bed Mobility: Rolling Rolling: Mod assist    General bed mobility comments: pt grabs  bedrail to roll upper trunk into sidelying towards L side, declines assist for lower body and declines rolling to R side. 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    Modified Rankin (Stroke Patients Only)       Balance Overall balance assessment: History of Falls              Pertinent Vitals/Pain Pain Assessment: No/denies pain    Home Living Family/patient expects to be discharged to:: Private residence Living Arrangements: Spouse/significant other Available Help at Discharge: Family;Available 24 hours/day Type of Home: House       Home Layout: One level Home Equipment: Cane - single point;Walker - 2 wheels;Toilet riser      Prior Function Level of Independence: Needs assistance   Gait / Transfers Assistance Needed: pt reports hasn't walked in "over a year", stays in bed  ADL's / Homemaking Assistance Needed: pt reports wife assists him "with everything"        Hand Dominance   Dominant Hand: Right    Extremity/Trunk Assessment   Upper Extremity Assessment Upper Extremity Assessment: Generalized weakness    Lower Extremity Assessment Lower Extremity Assessment: RLE deficits/detail;LLE deficits/detail RLE Deficits / Details: ankle AROM WNL, knee quad 2-/5, hamstring 2-/5, hip abduction 1/5, adduction 1/5 RLE Sensation: WNL LLE Deficits / Details: ankle AROM WNL, knee quad 2-/5, hamstring 2-/5, hip abduction 2-/5, adduction 2-/5 LLE Sensation: WNL    Cervical /  Trunk Assessment Cervical / Trunk Assessment:  (remains supine)  Communication   Communication: Expressive difficulties (mumbled speech)  Cognition Arousal/Alertness: Awake/alert Behavior During Therapy: WFL for tasks assessed/performed;Agitated Overall Cognitive Status: Within Functional Limits for tasks assessed   General Comments: Pt oriented to self and hospital. Pt slightly agitated with therapist, refusing to mobilize and talking about how he loves his dogs and no one can make him get rid of them?      General Comments      Exercises     Assessment/Plan    PT Assessment Patient needs continued PT services  PT Problem List Decreased strength;Decreased range of motion;Decreased activity tolerance;Decreased balance;Decreased mobility;Decreased knowledge of use of DME;Decreased safety awareness       PT Treatment Interventions DME instruction;Gait training;Functional mobility training;Therapeutic activities;Therapeutic exercise;Balance training;Neuromuscular re-education;Cognitive remediation;Patient/family education    PT Goals (Current goals can be found in the Care Plan section)  Acute Rehab PT Goals Patient Stated Goal: "get my damn breakfast" PT Goal Formulation: With patient Time For Goal Achievement: 05/06/21 Potential to Achieve Goals: Fair    Frequency Min 2X/week   Barriers to discharge        Co-evaluation               AM-PAC PT "6 Clicks" Mobility  Outcome Measure Help needed turning from your back to your side while in a flat bed without using bedrails?: A Lot Help needed moving from lying on your back to sitting on the side of a flat bed without using bedrails?: Total Help needed moving to and from a bed to a chair (including a wheelchair)?: Total Help needed standing up from a chair using your arms (e.g., wheelchair or bedside chair)?: Total Help needed to walk in hospital room?: Total Help needed climbing 3-5 steps with a railing? : Total 6 Click Score: 7    End of Session   Activity Tolerance: Other (comment) (limited by participation) Patient left: in bed;with call bell/phone within reach;with bed alarm set Nurse Communication: Mobility status PT Visit Diagnosis: Muscle weakness (generalized) (M62.81);Other abnormalities of gait and mobility (R26.89)     Time: 5974-1638 PT Time Calculation (min) (ACUTE ONLY): 13 min   Charges:   PT Evaluation $PT Eval Low Complexity: 1 Low           Tori Tristy Udovich PT, DPT 04/22/21, 8:50 AM

## 2021-04-23 LAB — URINE CULTURE: Culture: 60000 — AB

## 2021-04-25 LAB — CULTURE, BLOOD (ROUTINE X 2)
Culture: NO GROWTH
Culture: NO GROWTH
Special Requests: ADEQUATE

## 2021-04-28 ENCOUNTER — Other Ambulatory Visit (HOSPITAL_COMMUNITY): Payer: HMO

## 2021-05-13 ENCOUNTER — Emergency Department (HOSPITAL_COMMUNITY): Payer: HMO

## 2021-05-13 ENCOUNTER — Inpatient Hospital Stay (HOSPITAL_COMMUNITY)
Admission: EM | Admit: 2021-05-13 | Discharge: 2021-05-17 | DRG: 689 | Disposition: A | Discharge status: Hospice | Payer: HMO | Attending: Internal Medicine | Admitting: Internal Medicine

## 2021-05-13 DIAGNOSIS — E785 Hyperlipidemia, unspecified: Secondary | ICD-10-CM | POA: Diagnosis present

## 2021-05-13 DIAGNOSIS — F919 Conduct disorder, unspecified: Secondary | ICD-10-CM | POA: Diagnosis present

## 2021-05-13 DIAGNOSIS — R531 Weakness: Secondary | ICD-10-CM | POA: Diagnosis not present

## 2021-05-13 DIAGNOSIS — R410 Disorientation, unspecified: Secondary | ICD-10-CM | POA: Diagnosis not present

## 2021-05-13 DIAGNOSIS — N390 Urinary tract infection, site not specified: Secondary | ICD-10-CM | POA: Diagnosis not present

## 2021-05-13 DIAGNOSIS — E1169 Type 2 diabetes mellitus with other specified complication: Secondary | ICD-10-CM | POA: Diagnosis not present

## 2021-05-13 DIAGNOSIS — Z7984 Long term (current) use of oral hypoglycemic drugs: Secondary | ICD-10-CM

## 2021-05-13 DIAGNOSIS — R0902 Hypoxemia: Secondary | ICD-10-CM | POA: Diagnosis not present

## 2021-05-13 DIAGNOSIS — A419 Sepsis, unspecified organism: Secondary | ICD-10-CM | POA: Insufficient documentation

## 2021-05-13 DIAGNOSIS — K811 Chronic cholecystitis: Secondary | ICD-10-CM | POA: Diagnosis present

## 2021-05-13 DIAGNOSIS — E876 Hypokalemia: Secondary | ICD-10-CM | POA: Diagnosis not present

## 2021-05-13 DIAGNOSIS — I110 Hypertensive heart disease with heart failure: Secondary | ICD-10-CM | POA: Diagnosis not present

## 2021-05-13 DIAGNOSIS — G9340 Encephalopathy, unspecified: Secondary | ICD-10-CM | POA: Diagnosis not present

## 2021-05-13 DIAGNOSIS — E860 Dehydration: Secondary | ICD-10-CM | POA: Diagnosis present

## 2021-05-13 DIAGNOSIS — I6930 Unspecified sequelae of cerebral infarction: Secondary | ICD-10-CM | POA: Diagnosis not present

## 2021-05-13 DIAGNOSIS — I2510 Atherosclerotic heart disease of native coronary artery without angina pectoris: Secondary | ICD-10-CM | POA: Diagnosis not present

## 2021-05-13 DIAGNOSIS — Z20822 Contact with and (suspected) exposure to covid-19: Secondary | ICD-10-CM | POA: Diagnosis present

## 2021-05-13 DIAGNOSIS — E43 Unspecified severe protein-calorie malnutrition: Secondary | ICD-10-CM | POA: Diagnosis not present

## 2021-05-13 DIAGNOSIS — Z79899 Other long term (current) drug therapy: Secondary | ICD-10-CM

## 2021-05-13 DIAGNOSIS — R451 Restlessness and agitation: Secondary | ICD-10-CM | POA: Diagnosis not present

## 2021-05-13 DIAGNOSIS — I69328 Other speech and language deficits following cerebral infarction: Secondary | ICD-10-CM

## 2021-05-13 DIAGNOSIS — H9190 Unspecified hearing loss, unspecified ear: Secondary | ICD-10-CM | POA: Diagnosis present

## 2021-05-13 DIAGNOSIS — N319 Neuromuscular dysfunction of bladder, unspecified: Secondary | ICD-10-CM | POA: Diagnosis not present

## 2021-05-13 DIAGNOSIS — Z6826 Body mass index (BMI) 26.0-26.9, adult: Secondary | ICD-10-CM

## 2021-05-13 DIAGNOSIS — E139 Other specified diabetes mellitus without complications: Secondary | ICD-10-CM | POA: Diagnosis present

## 2021-05-13 DIAGNOSIS — N3000 Acute cystitis without hematuria: Secondary | ICD-10-CM | POA: Diagnosis not present

## 2021-05-13 DIAGNOSIS — G9341 Metabolic encephalopathy: Secondary | ICD-10-CM | POA: Diagnosis not present

## 2021-05-13 DIAGNOSIS — E7800 Pure hypercholesterolemia, unspecified: Secondary | ICD-10-CM | POA: Diagnosis not present

## 2021-05-13 DIAGNOSIS — R000 Tachycardia, unspecified: Secondary | ICD-10-CM | POA: Diagnosis not present

## 2021-05-13 DIAGNOSIS — I5022 Chronic systolic (congestive) heart failure: Secondary | ICD-10-CM | POA: Diagnosis not present

## 2021-05-13 DIAGNOSIS — Z8249 Family history of ischemic heart disease and other diseases of the circulatory system: Secondary | ICD-10-CM

## 2021-05-13 DIAGNOSIS — Z7189 Other specified counseling: Secondary | ICD-10-CM | POA: Diagnosis not present

## 2021-05-13 DIAGNOSIS — I10 Essential (primary) hypertension: Secondary | ICD-10-CM | POA: Diagnosis not present

## 2021-05-13 DIAGNOSIS — Z0389 Encounter for observation for other suspected diseases and conditions ruled out: Secondary | ICD-10-CM | POA: Diagnosis not present

## 2021-05-13 DIAGNOSIS — Z66 Do not resuscitate: Secondary | ICD-10-CM | POA: Diagnosis not present

## 2021-05-13 DIAGNOSIS — Z87891 Personal history of nicotine dependence: Secondary | ICD-10-CM

## 2021-05-13 DIAGNOSIS — I444 Left anterior fascicular block: Secondary | ICD-10-CM | POA: Diagnosis not present

## 2021-05-13 DIAGNOSIS — R4182 Altered mental status, unspecified: Secondary | ICD-10-CM | POA: Diagnosis not present

## 2021-05-13 DIAGNOSIS — F419 Anxiety disorder, unspecified: Secondary | ICD-10-CM | POA: Diagnosis present

## 2021-05-13 DIAGNOSIS — Z7401 Bed confinement status: Secondary | ICD-10-CM | POA: Diagnosis not present

## 2021-05-13 DIAGNOSIS — Z515 Encounter for palliative care: Secondary | ICD-10-CM

## 2021-05-13 DIAGNOSIS — R404 Transient alteration of awareness: Secondary | ICD-10-CM | POA: Diagnosis not present

## 2021-05-13 DIAGNOSIS — Z955 Presence of coronary angioplasty implant and graft: Secondary | ICD-10-CM

## 2021-05-13 DIAGNOSIS — R55 Syncope and collapse: Secondary | ICD-10-CM | POA: Diagnosis not present

## 2021-05-13 DIAGNOSIS — R6520 Severe sepsis without septic shock: Secondary | ICD-10-CM | POA: Diagnosis not present

## 2021-05-13 DIAGNOSIS — R0600 Dyspnea, unspecified: Secondary | ICD-10-CM | POA: Diagnosis not present

## 2021-05-13 DIAGNOSIS — Z880 Allergy status to penicillin: Secondary | ICD-10-CM

## 2021-05-13 DIAGNOSIS — Z781 Physical restraint status: Secondary | ICD-10-CM

## 2021-05-13 DIAGNOSIS — Z96651 Presence of right artificial knee joint: Secondary | ICD-10-CM | POA: Diagnosis present

## 2021-05-13 DIAGNOSIS — Z882 Allergy status to sulfonamides status: Secondary | ICD-10-CM

## 2021-05-13 LAB — I-STAT ARTERIAL BLOOD GAS, ED
Acid-base deficit: 2 mmol/L (ref 0.0–2.0)
Bicarbonate: 23.4 mmol/L (ref 20.0–28.0)
Calcium, Ion: 1.32 mmol/L (ref 1.15–1.40)
HCT: 37 % — ABNORMAL LOW (ref 39.0–52.0)
Hemoglobin: 12.6 g/dL — ABNORMAL LOW (ref 13.0–17.0)
O2 Saturation: 97 %
Patient temperature: 98.6
Potassium: 3.3 mmol/L — ABNORMAL LOW (ref 3.5–5.1)
Sodium: 138 mmol/L (ref 135–145)
TCO2: 25 mmol/L (ref 22–32)
pCO2 arterial: 39.4 mmHg (ref 32.0–48.0)
pH, Arterial: 7.382 (ref 7.350–7.450)
pO2, Arterial: 96 mmHg (ref 83.0–108.0)

## 2021-05-13 LAB — CBC WITH DIFFERENTIAL/PLATELET
Abs Immature Granulocytes: 0.02 10*3/uL (ref 0.00–0.07)
Basophils Absolute: 0 10*3/uL (ref 0.0–0.1)
Basophils Relative: 1 %
Eosinophils Absolute: 0.1 10*3/uL (ref 0.0–0.5)
Eosinophils Relative: 1 %
HCT: 47.3 % (ref 39.0–52.0)
Hemoglobin: 14.9 g/dL (ref 13.0–17.0)
Immature Granulocytes: 0 %
Lymphocytes Relative: 21 %
Lymphs Abs: 1.2 10*3/uL (ref 0.7–4.0)
MCH: 28.7 pg (ref 26.0–34.0)
MCHC: 31.5 g/dL (ref 30.0–36.0)
MCV: 91 fL (ref 80.0–100.0)
Monocytes Absolute: 0.8 10*3/uL (ref 0.1–1.0)
Monocytes Relative: 14 %
Neutro Abs: 3.8 10*3/uL (ref 1.7–7.7)
Neutrophils Relative %: 63 %
Platelets: 283 10*3/uL (ref 150–400)
RBC: 5.2 MIL/uL (ref 4.22–5.81)
RDW: 15.7 % — ABNORMAL HIGH (ref 11.5–15.5)
WBC: 6 10*3/uL (ref 4.0–10.5)
nRBC: 0 % (ref 0.0–0.2)

## 2021-05-13 LAB — URINALYSIS, ROUTINE W REFLEX MICROSCOPIC
Bilirubin Urine: NEGATIVE
Glucose, UA: NEGATIVE mg/dL
Hgb urine dipstick: NEGATIVE
Ketones, ur: 20 mg/dL — AB
Nitrite: POSITIVE — AB
Protein, ur: NEGATIVE mg/dL
Specific Gravity, Urine: 1.01 (ref 1.005–1.030)
WBC, UA: >50 WBC/hpf — ABNORMAL HIGH (ref 0–5)
pH: 6 (ref 5.0–8.0)

## 2021-05-13 LAB — COMPREHENSIVE METABOLIC PANEL
ALT: 9 U/L (ref 0–44)
AST: 22 U/L (ref 15–41)
Albumin: 2.8 g/dL — ABNORMAL LOW (ref 3.5–5.0)
Alkaline Phosphatase: 141 U/L — ABNORMAL HIGH (ref 38–126)
Anion gap: 14 (ref 5–15)
BUN: 8 mg/dL (ref 8–23)
CO2: 21 mmol/L — ABNORMAL LOW (ref 22–32)
Calcium: 9.1 mg/dL (ref 8.9–10.3)
Chloride: 100 mmol/L (ref 98–111)
Creatinine, Ser: 0.75 mg/dL (ref 0.61–1.24)
GFR, Estimated: >60 mL/min (ref >60)
Glucose, Bld: 87 mg/dL (ref 70–99)
Potassium: 3.7 mmol/L (ref 3.5–5.1)
Sodium: 135 mmol/L (ref 135–145)
Total Bilirubin: 1.3 mg/dL — ABNORMAL HIGH (ref 0.3–1.2)
Total Protein: 5.9 g/dL — ABNORMAL LOW (ref 6.5–8.1)

## 2021-05-13 LAB — CBG MONITORING, ED: Glucose-Capillary: 85 mg/dL (ref 70–99)

## 2021-05-13 LAB — I-STAT CHEM 8, ED
BUN: 6 mg/dL — ABNORMAL LOW (ref 8–23)
Calcium, Ion: 1.19 mmol/L (ref 1.15–1.40)
Chloride: 102 mmol/L (ref 98–111)
Creatinine, Ser: 0.5 mg/dL — ABNORMAL LOW (ref 0.61–1.24)
Glucose, Bld: 85 mg/dL (ref 70–99)
HCT: 42 % (ref 39.0–52.0)
Hemoglobin: 14.3 g/dL (ref 13.0–17.0)
Potassium: 3.7 mmol/L (ref 3.5–5.1)
Sodium: 137 mmol/L (ref 135–145)
TCO2: 26 mmol/L (ref 22–32)

## 2021-05-13 LAB — LACTIC ACID, PLASMA: Lactic Acid, Venous: 1.1 mmol/L (ref 0.5–1.9)

## 2021-05-13 LAB — RESP PANEL BY RT-PCR (FLU A&B, COVID) ARPGX2
Influenza A by PCR: NEGATIVE
Influenza B by PCR: NEGATIVE
SARS Coronavirus 2 by RT PCR: NEGATIVE

## 2021-05-13 LAB — APTT: aPTT: 31 seconds (ref 24–36)

## 2021-05-13 LAB — PROTIME-INR
INR: 1.1 (ref 0.8–1.2)
Prothrombin Time: 14.5 seconds (ref 11.4–15.2)

## 2021-05-13 IMAGING — CT CT HEAD W/O CM
4 series · 16 of 47 positions shown, 18 images · non-contrast
Comparison: [DATE]

CLINICAL DATA: Altered mental status for 2 days

EXAM:
CT HEAD WITHOUT CONTRAST
TECHNIQUE: Contiguous axial images were obtained from the base of the skull
through the vertex without intravenous contrast.

[Series 3: head without · axial · non-contrast · 0.44mm/px · z∈[-142,+2]mm · 7 of 39 slices shown, 9 images]
[im 5/39  brain]
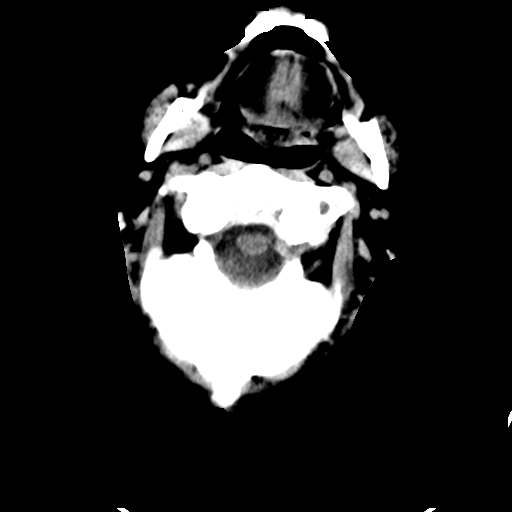
[im 5/39  bone]
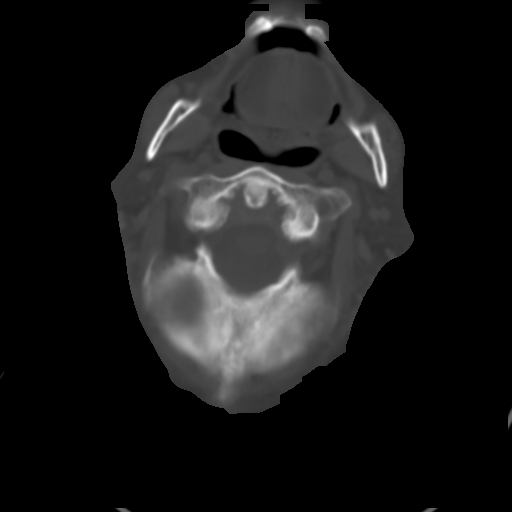
[im 10/39  brain]
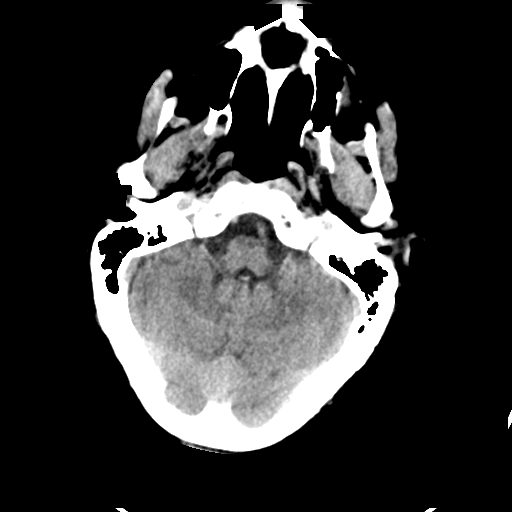
[im 15/39  brain]
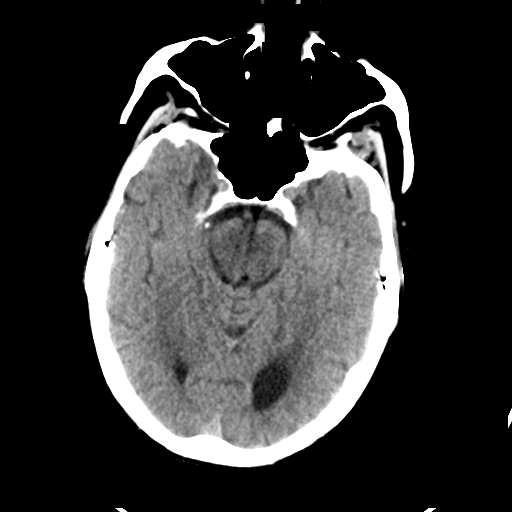
[im 20/39  brain]
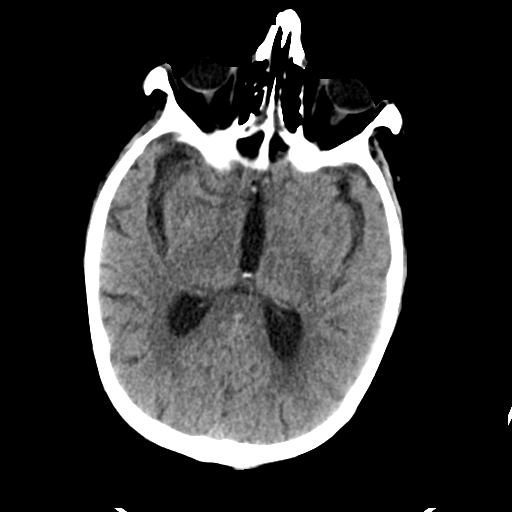
[im 24/39  brain]
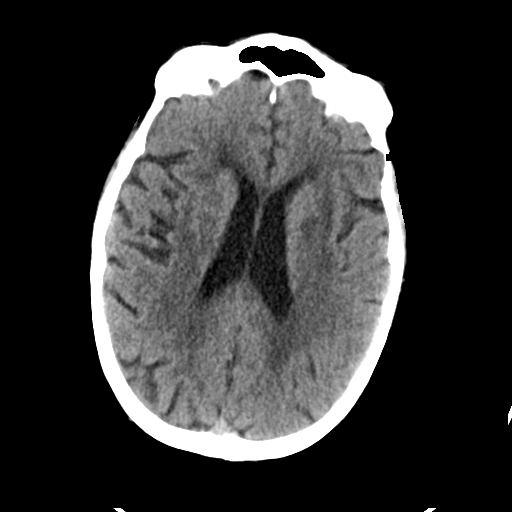
[im 24/39  bone]
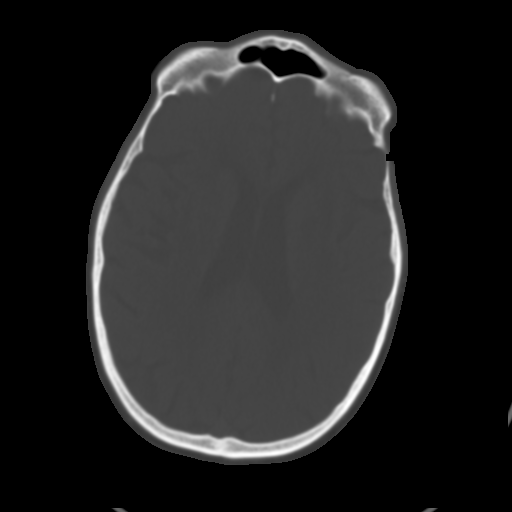
[im 29/39  brain]
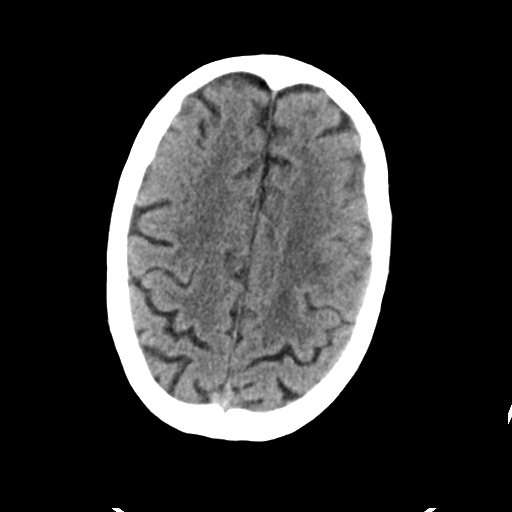
[im 34/39  brain]
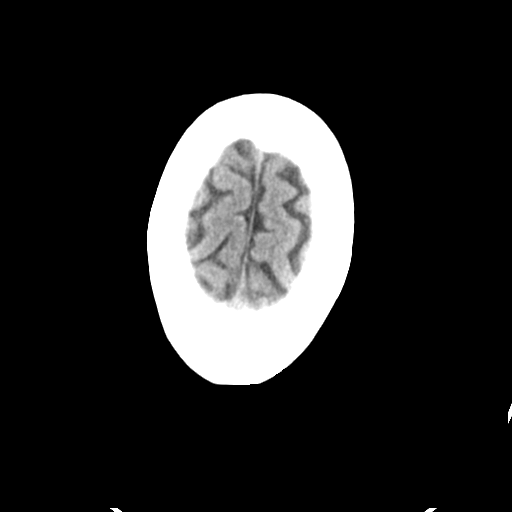

[Series 4: head bone · axial · 0.44mm/px · z∈[-144,-106]mm · 3 of 97 slices shown]
[im 10/97  bone]
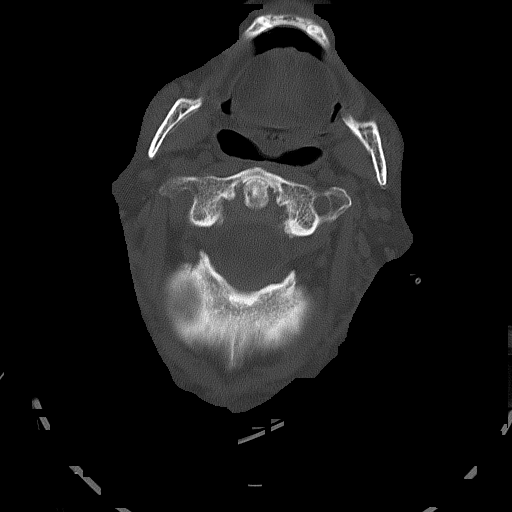
[im 20/97  bone]
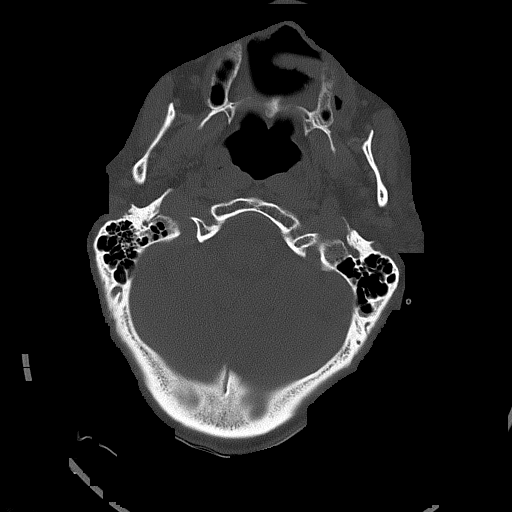
[im 29/97  bone]
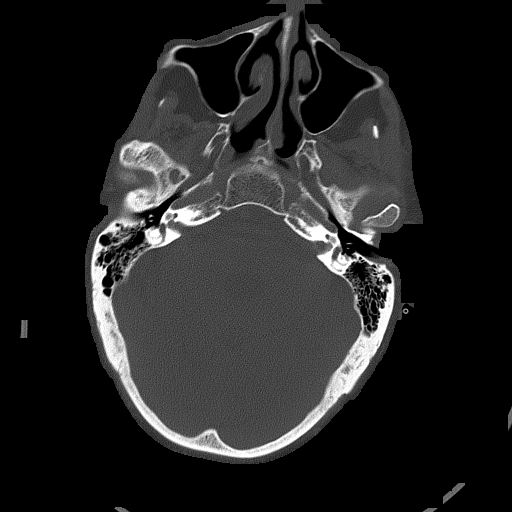

[Series 5: head without cor · coronal · non-contrast · 0.38mm/px · 3 of 71 slices shown]
[im 28/71  brain]
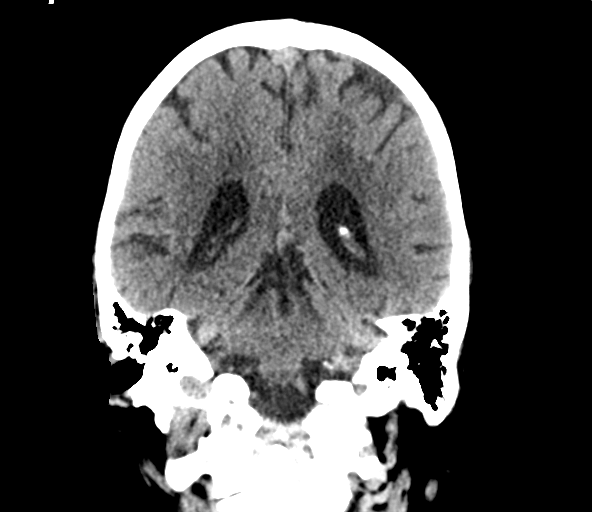
[im 33/71  brain]
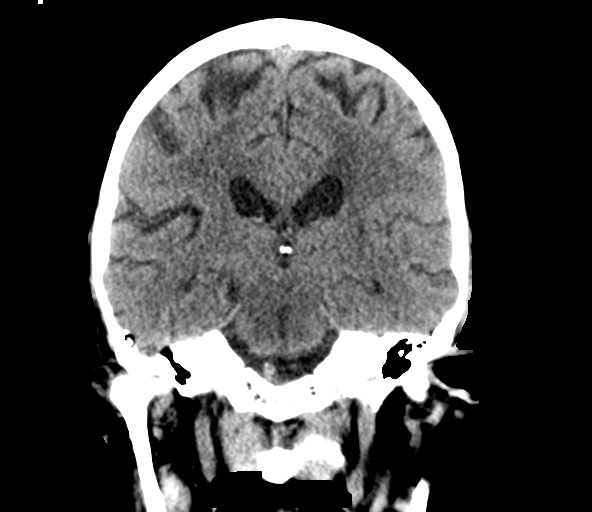
[im 38/71  brain]
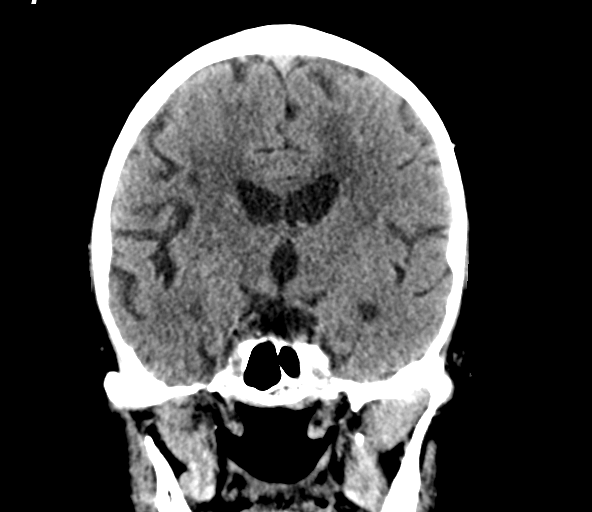

[Series 6: head without sag · sagittal · non-contrast · 0.37mm/px · 3 of 63 slices shown]
[im 21/63  brain]
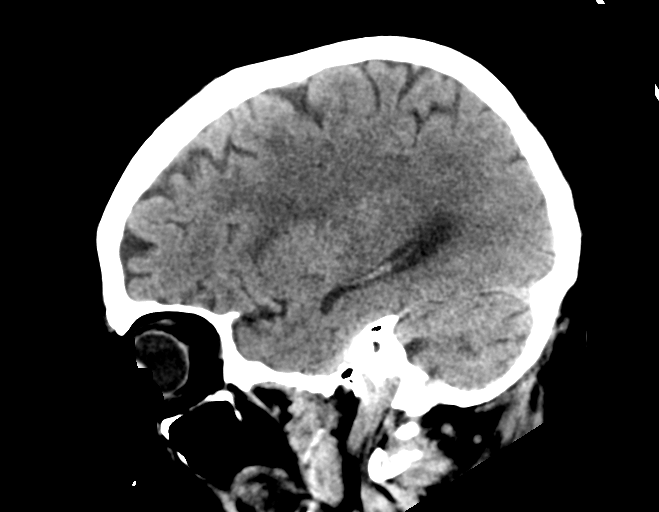
[im 32/63  brain]
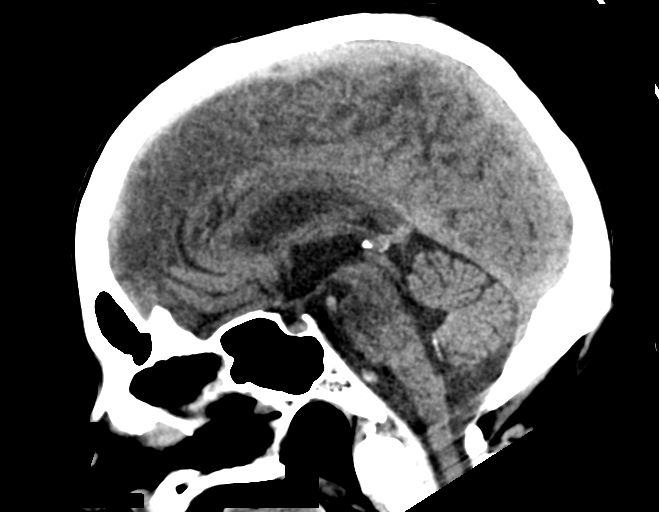
[im 42/63  brain]
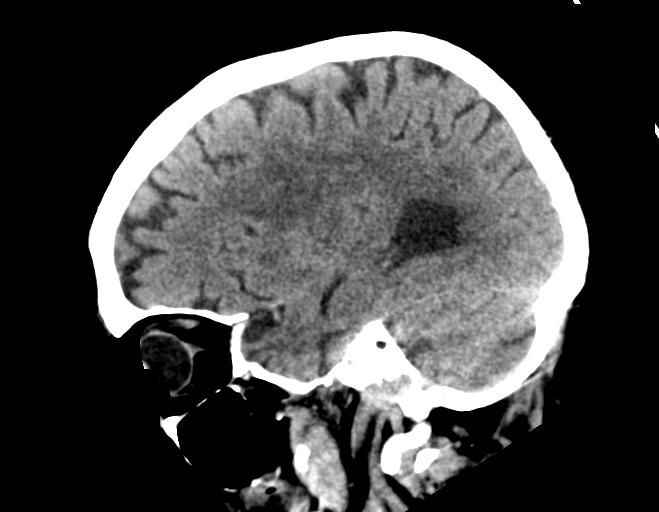

[16 of 47 positions shown; findings below may reference images not displayed]

FINDINGS: Brain: No evidence of acute infarction, hemorrhage, hydrocephalus,
extra-axial collection or mass lesion/mass effect. Chronic white
matter ischemic changes are noted.

Vascular: No hyperdense vessel or unexpected calcification.

Skull: Normal. Negative for fracture or focal lesion.

Sinuses/Orbits: No acute finding.

Other: None.
IMPRESSION: Chronic white matter ischemic change without acute intracranial
abnormality.

## 2021-05-13 IMAGING — DX DG CHEST 1V PORT
1 series · 1 of 1 positions shown · non-contrast
Comparison: [DATE]

CLINICAL DATA: Questionable sepsis

EXAM:
PORTABLE CHEST 1 VIEW

[chest]
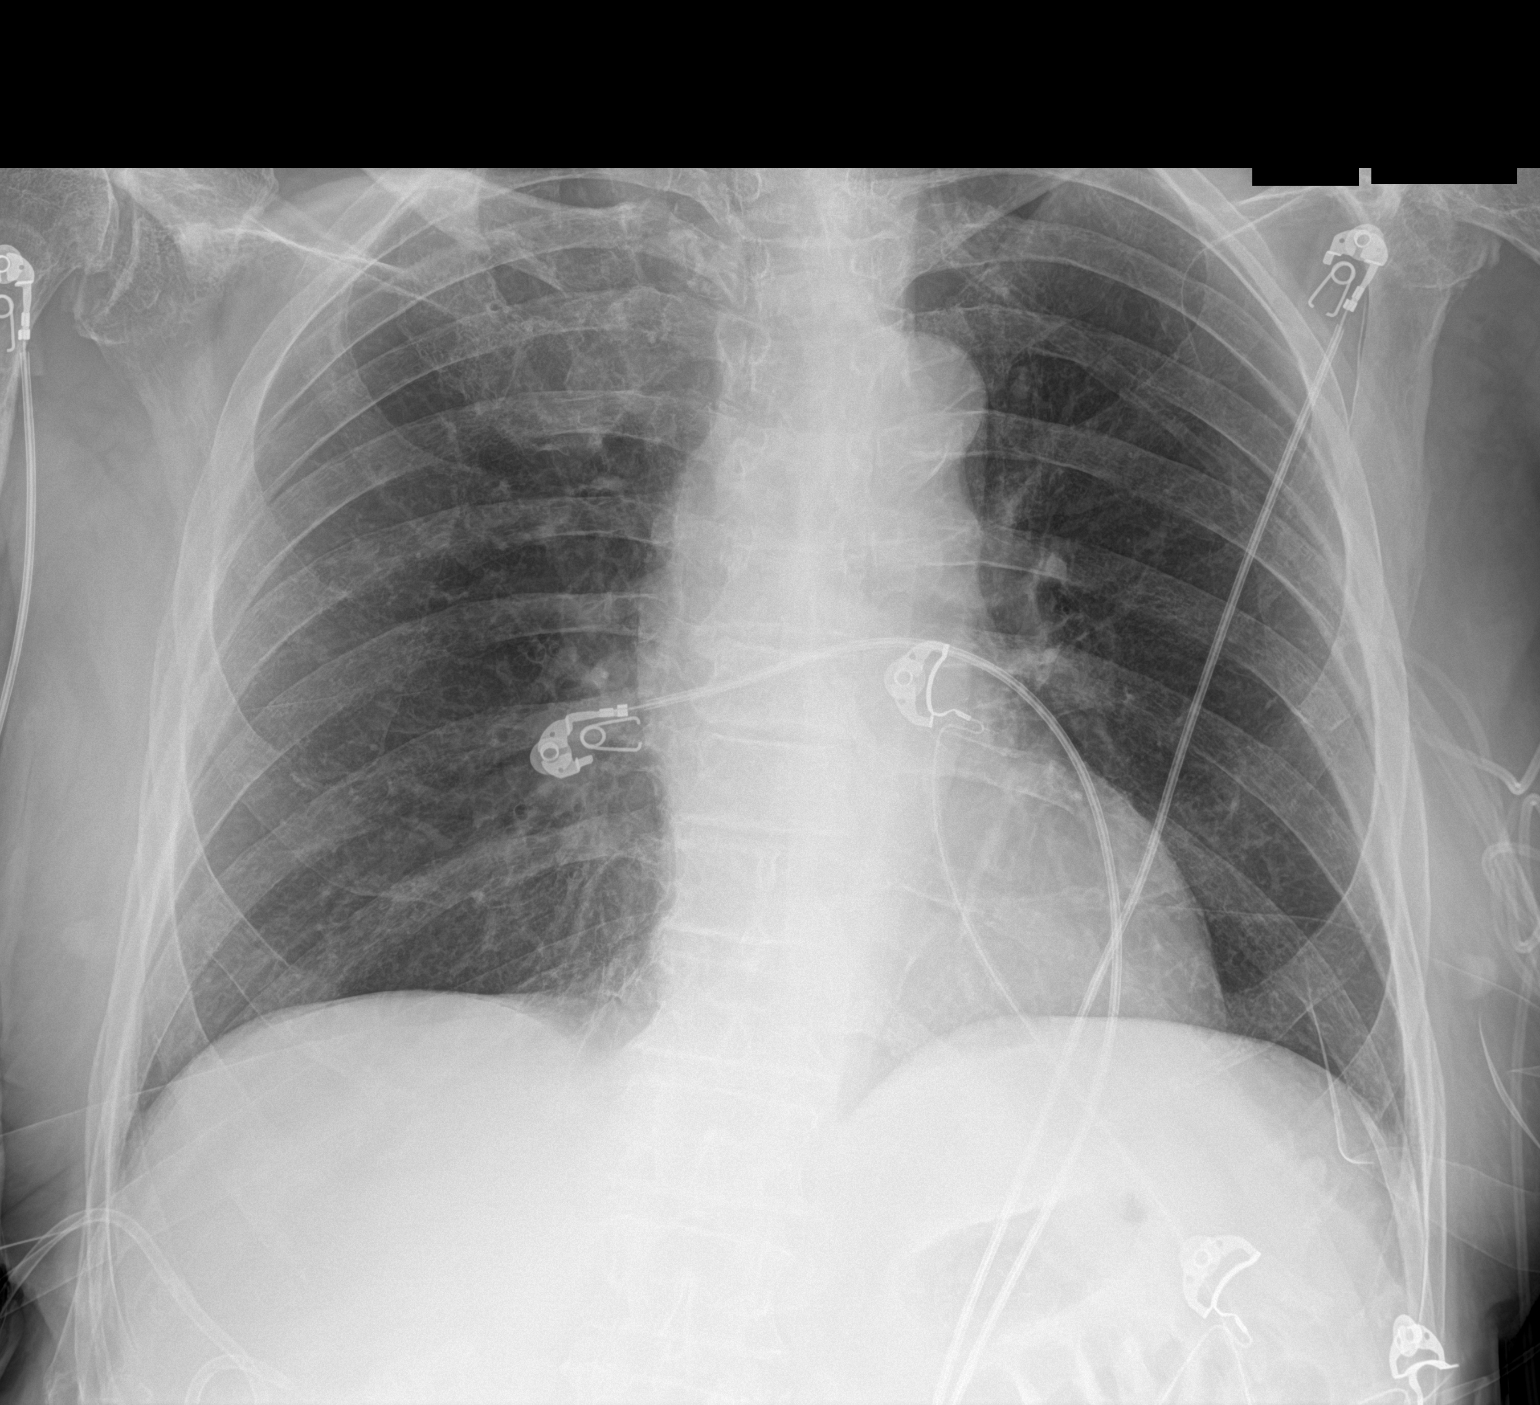

[1 of 1 positions shown; findings below may reference images not displayed]

FINDINGS: Heart and mediastinal contours are within normal limits. No focal
opacities or effusions. No acute bony abnormality.
IMPRESSION: No active disease.

## 2021-05-13 MED ORDER — LACTATED RINGERS IV BOLUS (SEPSIS)
1000.0000 mL | Freq: Once | INTRAVENOUS | Status: AC
  Administered 2021-05-13: 1000 mL via INTRAVENOUS

## 2021-05-13 MED ORDER — LACTATED RINGERS IV BOLUS
1000.0000 mL | Freq: Once | INTRAVENOUS | Status: AC
  Administered 2021-05-13: 1000 mL via INTRAVENOUS

## 2021-05-13 MED ORDER — SODIUM CHLORIDE 0.9 % IV SOLN
1.0000 g | INTRAVENOUS | Status: DC
  Administered 2021-05-13 – 2021-05-16 (×4): 1 g via INTRAVENOUS
  Filled 2021-05-13 (×3): qty 10

## 2021-05-13 NOTE — ED Provider Notes (Signed)
[facility] EMERGENCY DEPARTMENT Provider Note   CSN: [id number] Arrival date & time: 05/13/21  1842  LEVEL 5 CAVEAT - ALTERED MENTAL STATUS     History Chief Complaint  Patient presents with   Altered Mental Status    Vincent [name] is a 70 y.o. male.  HPI 70 year old male presents with altered mental status.  History is from the nurses spoke to EMS as well as the wife over the phone.  EMS is no longer present and the patient is altered.  The patient has been progressively more altered and eating and drinking less over the last couple days.  She is worried he might of had a stroke but this is also how he has presented with UTIs.  He also has a chronic gallbladder drain.  No fevers or vomiting.  She given Ativan and another medicine she does not know the name of for agitation about 4 hours ago because he was agitated. He has only urinated twice today.  Past Medical History:  Diagnosis Date   Acute MI [facility])    Allergy    Anxiety    Arthritis    Coronary artery disease    Depression    Diabetes mellitus without complication ([place])    Diverticulosis    mild   Hemorrhoids    HOH (hard of hearing)    Hx of colonic polyps    Hyperlipidemia    Hypertension    Left ventricular dysfunction    hx of    Patient Active Problem List   Diagnosis Date Noted   Sepsis ([place]) 05/13/2021   Pressure injury of skin 04/21/2021   UTI (urinary tract infection) 03/14/2021   AKI (acute kidney injury) ([place]) 03/14/2021   Acute encephalopathy 03/14/2021   Hypokalemia 03/14/2021   Depression    Anxiety    Severe sepsis with septic shock ([place]) 02/06/2021   Chronic cholecystitis 02/06/2021   Sacral decubitus ulcer 02/06/2021   Severe sepsis ([place]) 02/06/2021   Vitamin B12 deficiency 01/27/2021   Diabetes 1.5, 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   Chronic systolic CHF (congestive heart failure) ([place]) 01/27/2021   CVA (cerebral vascular accident) ([place]) 01/12/2021   Weakness     Precordial pain    Right knee pain 01/07/2018   History of total right knee replacement 01/07/2018   SOB (shortness of breath) 08/12/2016   Tachycardia 08/12/2016   Low grade fever 08/12/2016   Osteoarthritis of right knee 08/09/2016   OSA (obstructive sleep apnea) 11/29/2011   Syncope 07/24/2011   ARM NUMBNESS 06/19/2010   EDEMA 03/13/2010   FASTING HYPERGLYCEMIA 03/13/2010   CORONARY ATHEROSCLEROSIS NATIVE CORONARY ARTERY 06/20/2009   COLONIC POLYPS, ADENOMATOUS 01/31/2009   HLD (hyperlipidemia) 01/31/2009   Hypertension 01/31/2009   Coronary atherosclerosis 01/31/2009   HEMORRHOIDS 01/31/2009   DIVERTICULOSIS, MILD 01/31/2009    Past Surgical History:  Procedure Laterality Date   ANGIOPLASTY     and bare metal stent placement   ANKLE SURGERY Right 1986   i&d abscess- pt states "put a bone in it"   CORONARY ANGIOPLASTY WITH STENT PLACEMENT     IR EXCHANGE BILIARY DRAIN  04/21/2021   IR PERC CHOLECYSTOSTOMY  02/07/2021   KNEE ARTHROPLASTY Right 08/09/2016   Procedure: RIGHT TOTAL KNEE ARTHROPLASTY WITH COMPUTER NAVIGATION;  Surgeon: Rod Can, MD;  Location: [facility];  Service: Orthopedics;  Laterality: Right;  Needs RNFA   LEFT HEART CATH AND CORONARY ANGIOGRAPHY N/A 11/21/2018   Procedure: LEFT HEART CATH AND CORONARY  ANGIOGRAPHY;  Surgeon: Burnell Blanks, MD;  Location: [facility] CV LAB;  Service: Cardiovascular;  Laterality: N/A;       Family History  Problem Relation Age of Onset   Coronary artery disease Other        positive family hx of   Colon cancer Neg Hx    Esophageal cancer Neg Hx    Liver cancer Neg Hx    Pancreatic cancer Neg Hx    Rectal cancer Neg Hx    Stomach cancer Neg Hx     Social History   Tobacco Use   Smoking status: Former    Types: Cigars    Quit date: 12/17/2005    Years since quitting: 15.4   Smokeless tobacco: Former    Quit date: 12/17/2005  Vaping Use   Vaping Use: Never used  Substance Use Topics   Alcohol use: No    Drug use: No    Home Medications Prior to Admission medications   Medication Sig Start Date End Date Taking? Authorizing Provider  aspirin EC 81 MG tablet Take 1 tablet (81 mg total) by mouth at bedtime. 03/18/21   Florencia Reasons, MD  LORazepam (ATIVAN) 1 MG tablet Take 1 mg by mouth every 8 (eight) hours as needed for anxiety.    [provider]  Magnesium Oxide (MAG-OXIDE) 200 MG TABS Take 1 tablet (200 mg total) by mouth daily at 12 noon. 03/18/21   Florencia Reasons, MD  metFORMIN (GLUCOPHAGE) 1000 MG tablet Take 1,000 mg by mouth 2 (two) times daily.    [provider]  pantoprazole (PROTONIX) 40 MG tablet Take 1 tablet (40 mg total) by mouth daily. 02/12/21   Lavina Hamman, MD  QUEtiapine (SEROQUEL) 25 MG tablet Take 1 tablet (25 mg total) by mouth at bedtime. 03/18/21   Florencia Reasons, MD  simvastatin (ZOCOR) 40 MG tablet Take 1 tablet (40 mg total) by mouth at bedtime. 11/04/17   Lyda Jester M, PA-C  tamsulosin (FLOMAX) 0.4 MG CAPS capsule Take 1 capsule (0.4 mg total) by mouth daily. 03/18/21   Florencia Reasons, MD  traZODone (DESYREL) 50 MG tablet Take 50 mg by mouth at bedtime. 02/28/21   [provider]  vitamin B-12 (CYANOCOBALAMIN) 1000 MCG tablet Take 1 tablet (1,000 mcg total) by mouth daily. 01/27/21   Hosie Poisson, MD    Allergies    Penicillins  Review of Systems   Review of Systems  Unable to perform ROS: Mental status change   Physical Exam Updated Vital Signs BP 132/77   Pulse (!) 104   Temp 98.1 F (36.7 C) (Rectal)   Resp 14   SpO2 100%   Physical Exam Vitals and nursing note reviewed.  Constitutional:      Appearance: He is well-developed. He is cachectic. He is ill-appearing. He is not diaphoretic.  HENT:     Head: Normocephalic and atraumatic.     Right Ear: External ear normal.     Left Ear: External ear normal.     Nose: Nose normal.     Mouth/Throat:     Mouth: Mucous membranes are dry.  Eyes:     General:        Right eye: No discharge.         Left eye: No discharge.  Cardiovascular:     Rate and Rhythm: Regular rhythm. Tachycardia present.     Heart sounds: Normal heart sounds.  Pulmonary:     Effort: Pulmonary effort is normal.  Breath sounds: Normal breath sounds.  Abdominal:     Palpations: Abdomen is soft.     Tenderness: There is no abdominal tenderness.     Comments: Right sided drain in place  Musculoskeletal:     Cervical back: Neck supple.  Skin:    General: Skin is warm and dry.  Neurological:     Mental Status: He is lethargic.     Comments: Patient is lethargic. Will open eyes and give a thumbs up when asked, but does not follow many commands. Seems to spontaneously move all 4 extremities.   Psychiatric:        Mood and Affect: Mood is not anxious.    ED Results / Procedures / Treatments   Labs (all labs ordered are listed, but only abnormal results are displayed) Labs Reviewed  COMPREHENSIVE METABOLIC PANEL - Abnormal; Notable for the following components:      Result Value   CO2 21 (*)    Total Protein 5.9 (*)    Albumin 2.8 (*)    Alkaline Phosphatase 141 (*)    Total Bilirubin 1.3 (*)    All other components within normal limits  CBC WITH DIFFERENTIAL/PLATELET - Abnormal; Notable for the following components:   RDW 15.7 (*)    All other components within normal limits  URINALYSIS, ROUTINE W REFLEX MICROSCOPIC - Abnormal; Notable for the following components:   APPearance HAZY (*)    Ketones, ur 20 (*)    Nitrite POSITIVE (*)    Leukocytes,Ua LARGE (*)    WBC, UA >50 (*)    Bacteria, UA RARE (*)    All other components within normal limits  I-STAT ARTERIAL BLOOD GAS, ED - Abnormal; Notable for the following components:   Potassium 3.3 (*)    HCT 37.0 (*)    Hemoglobin 12.6 (*)    All other components within normal limits  I-STAT CHEM 8, ED - Abnormal; Notable for the following components:   BUN 6 (*)    Creatinine, Ser 0.50 (*)    All other components within normal limits   RESP PANEL BY RT-PCR (FLU A&B, COVID) [id number]  CULTURE, BLOOD (ROUTINE X 2)  CULTURE, BLOOD (ROUTINE X 2)  URINE CULTURE  LACTIC ACID, PLASMA  PROTIME-INR  APTT  LACTIC ACID, PLASMA  RAPID URINE DRUG SCREEN, HOSP PERFORMED  LACTIC ACID, PLASMA  LACTIC ACID, PLASMA  PROCALCITONIN  CBG MONITORING, ED    EKG EKG Interpretation  Date/Time:  Saturday May 13 2021 18:46:25 EDT Ventricular Rate:  108 PR Interval:  162 QRS Duration: 111 QT Interval:  371 QTC Calculation: 498 R Axis:   -7 Text Interpretation: Sinus tachycardia Borderline low voltage, extremity leads Anteroseptal infarct, old Confirmed by Sherwood Gambler [phone]) on 05/13/2021 7:01:26 PM  Radiology CT HEAD WO CONTRAST (5MM)  Result Date: 05/13/2021 CLINICAL DATA:  Altered mental status for 2 days EXAM: CT HEAD WITHOUT CONTRAST TECHNIQUE: Contiguous axial images were obtained from the base of the skull through the vertex without intravenous contrast. COMPARISON:  03/14/2021 FINDINGS: Brain: No evidence of acute infarction, hemorrhage, hydrocephalus, extra-axial collection or mass lesion/mass effect. Chronic white matter ischemic changes are noted. Vascular: No hyperdense vessel or unexpected calcification. Skull: Normal. Negative for fracture or focal lesion. Sinuses/Orbits: No acute finding. Other: None. IMPRESSION: Chronic white matter ischemic change without acute intracranial abnormality. Electronically Signed   By: Inez Catalina M.D.   On: 05/13/2021 19:53   DG Chest Port 1 View  Result Date: 05/13/2021 CLINICAL DATA:  Questionable sepsis EXAM: PORTABLE CHEST 1 VIEW COMPARISON:  04/20/2021 FINDINGS: Heart and mediastinal contours are within normal limits. No focal opacities or effusions. No acute bony abnormality. IMPRESSION: No active disease. Electronically Signed   By: Rolm Baptise M.D.   On: 05/13/2021 19:45    Procedures Ultrasound ED Peripheral IV (Provider)  Date/Time: 05/13/2021 7:22 PM Performed by: Sherwood Gambler, MD Authorized by: Sherwood Gambler, MD   Procedure details:    Indications: multiple failed IV attempts and poor IV access     Skin Prep: chlorhexidine gluconate     Location:  Right AC   Angiocath:  18 G   Bedside Ultrasound Guided: Yes     Patient tolerated procedure without complications: Yes     Dressing applied: Yes   .Critical Care  Date/Time: 05/13/2021 11:18 PM Performed by: Sherwood Gambler, MD Authorized by: Sherwood Gambler, MD   Critical care provider statement:    Critical care time (minutes):  35   Critical care time was exclusive of:  Separately billable procedures and treating other patients   Critical care was necessary to treat or prevent imminent or life-threatening deterioration of the following conditions:  Sepsis   Critical care was time spent personally by me on the following activities:  Discussions with consultants, evaluation of patient's response to treatment, examination of patient, ordering and performing treatments and interventions, ordering and review of laboratory studies, ordering and review of radiographic studies, pulse oximetry, re-evaluation of patient's condition, obtaining history from patient or surrogate and review of old charts   Medications Ordered in ED Medications  cefTRIAXone (ROCEPHIN) 1 g in sodium chloride 0.9 % 100 mL IVPB (0 g Intravenous Stopped 05/13/21 2012)  lactated ringers bolus 1,000 mL (0 mLs Intravenous Stopped 05/13/21 2026)  lactated ringers bolus 1,000 mL (1,000 mLs Intravenous New Bag/Given 05/13/21 1927)  lactated ringers bolus 1,000 mL (1,000 mLs Intravenous New Bag/Given 05/13/21 2304)    ED Course  I have reviewed the triage vital signs and the nursing notes.  Pertinent labs & imaging results that were available during my care of the patient were reviewed by me and considered in my medical decision making (see chart for details).    MDM Rules/Calculators/A&P                           Patient is ill-appearing and  appears dry on exam.  He was given IV fluids.  His labs look a lot better than he does including normal lactate, normal WBC.  His renal function is technically within the normal range but is increased compared to his relative baseline.  No focal findings on exam.  No abdominal tenderness.  Urine is consistent with UTI which is probably causing his acute encephalopathy as it has in the past.  He was given fluids and IV Rocephin.  He is maintaining his airway but still altered.  Will admit to hospitalist service. Final Clinical Impression(s) / ED Diagnoses Final diagnoses:  Acute UTI  Encephalopathy    Rx / DC Orders ED Discharge Orders     None        Sherwood Gambler, MD 05/13/21 2320

## 2021-05-13 NOTE — Sepsis Progress Note (Signed)
Elink following for sepsis protocol. 

## 2021-05-13 NOTE — ED Notes (Signed)
Bladder Scan 30ML

## 2021-05-13 NOTE — ED Triage Notes (Signed)
Pt here via ems from home with reports of confusion since yesterday per wife. Pt with decreased PO intake for several days and no urine output since yesterday. Pt mumbling on arrival to treatment room. Wife available by phone for questions. Pt normally alert and oriented X4 per EMS.   116 HR

## 2021-05-14 ENCOUNTER — Inpatient Hospital Stay (HOSPITAL_COMMUNITY): Payer: HMO

## 2021-05-14 DIAGNOSIS — I5022 Chronic systolic (congestive) heart failure: Secondary | ICD-10-CM

## 2021-05-14 DIAGNOSIS — R6520 Severe sepsis without septic shock: Secondary | ICD-10-CM

## 2021-05-14 DIAGNOSIS — E139 Other specified diabetes mellitus without complications: Secondary | ICD-10-CM

## 2021-05-14 DIAGNOSIS — A419 Sepsis, unspecified organism: Secondary | ICD-10-CM

## 2021-05-14 DIAGNOSIS — K811 Chronic cholecystitis: Secondary | ICD-10-CM

## 2021-05-14 DIAGNOSIS — E785 Hyperlipidemia, unspecified: Secondary | ICD-10-CM

## 2021-05-14 DIAGNOSIS — I6930 Unspecified sequelae of cerebral infarction: Secondary | ICD-10-CM

## 2021-05-14 DIAGNOSIS — Z7189 Other specified counseling: Secondary | ICD-10-CM

## 2021-05-14 DIAGNOSIS — N3000 Acute cystitis without hematuria: Secondary | ICD-10-CM

## 2021-05-14 DIAGNOSIS — Z515 Encounter for palliative care: Secondary | ICD-10-CM

## 2021-05-14 DIAGNOSIS — N390 Urinary tract infection, site not specified: Principal | ICD-10-CM

## 2021-05-14 DIAGNOSIS — I10 Essential (primary) hypertension: Secondary | ICD-10-CM

## 2021-05-14 DIAGNOSIS — G9340 Encephalopathy, unspecified: Secondary | ICD-10-CM

## 2021-05-14 LAB — CBC WITH DIFFERENTIAL/PLATELET
Abs Immature Granulocytes: 0.03 10*3/uL (ref 0.00–0.07)
Basophils Absolute: 0.1 10*3/uL (ref 0.0–0.1)
Basophils Relative: 1 %
Eosinophils Absolute: 0.1 10*3/uL (ref 0.0–0.5)
Eosinophils Relative: 1 %
HCT: 46.4 % (ref 39.0–52.0)
Hemoglobin: 13.9 g/dL (ref 13.0–17.0)
Immature Granulocytes: 0 %
Lymphocytes Relative: 19 %
Lymphs Abs: 1.4 10*3/uL (ref 0.7–4.0)
MCH: 28.1 pg (ref 26.0–34.0)
MCHC: 30 g/dL (ref 30.0–36.0)
MCV: 93.7 fL (ref 80.0–100.0)
Monocytes Absolute: 1 10*3/uL (ref 0.1–1.0)
Monocytes Relative: 14 %
Neutro Abs: 4.7 10*3/uL (ref 1.7–7.7)
Neutrophils Relative %: 65 %
Platelets: 252 10*3/uL (ref 150–400)
RBC: 4.95 MIL/uL (ref 4.22–5.81)
RDW: 16 % — ABNORMAL HIGH (ref 11.5–15.5)
WBC: 7.2 10*3/uL (ref 4.0–10.5)
nRBC: 0 % (ref 0.0–0.2)

## 2021-05-14 LAB — COMPREHENSIVE METABOLIC PANEL
ALT: <5 U/L (ref 0–44)
AST: 26 U/L (ref 15–41)
Albumin: 2.6 g/dL — ABNORMAL LOW (ref 3.5–5.0)
Alkaline Phosphatase: 128 U/L — ABNORMAL HIGH (ref 38–126)
Anion gap: 16 — ABNORMAL HIGH (ref 5–15)
BUN: 6 mg/dL — ABNORMAL LOW (ref 8–23)
CO2: 21 mmol/L — ABNORMAL LOW (ref 22–32)
Calcium: 9.1 mg/dL (ref 8.9–10.3)
Chloride: 101 mmol/L (ref 98–111)
Creatinine, Ser: 0.64 mg/dL (ref 0.61–1.24)
GFR, Estimated: >60 mL/min (ref >60)
Glucose, Bld: 74 mg/dL (ref 70–99)
Potassium: 4 mmol/L (ref 3.5–5.1)
Sodium: 138 mmol/L (ref 135–145)
Total Bilirubin: 0.8 mg/dL (ref 0.3–1.2)
Total Protein: <3 g/dL — ABNORMAL LOW (ref 6.5–8.1)

## 2021-05-14 LAB — LACTIC ACID, PLASMA
Lactic Acid, Venous: 1.6 mmol/L (ref 0.5–1.9)
Lactic Acid, Venous: 1 mmol/L (ref 0.5–1.9)

## 2021-05-14 LAB — CBG MONITORING, ED
Glucose-Capillary: 71 mg/dL (ref 70–99)
Glucose-Capillary: 67 mg/dL — ABNORMAL LOW (ref 70–99)
Glucose-Capillary: 81 mg/dL (ref 70–99)
Glucose-Capillary: 75 mg/dL (ref 70–99)
Glucose-Capillary: 78 mg/dL (ref 70–99)

## 2021-05-14 LAB — GLUCOSE, CAPILLARY: Glucose-Capillary: 113 mg/dL — ABNORMAL HIGH (ref 70–99)

## 2021-05-14 LAB — PHOSPHORUS: Phosphorus: 2.8 mg/dL (ref 2.5–4.6)

## 2021-05-14 LAB — MAGNESIUM: Magnesium: 2 mg/dL (ref 1.7–2.4)

## 2021-05-14 LAB — TSH: TSH: 1.312 u[IU]/mL (ref 0.350–4.500)

## 2021-05-14 LAB — HEMOGLOBIN A1C
Hgb A1c MFr Bld: 4.8 % (ref 4.8–5.6)
Mean Plasma Glucose: 91.06 mg/dL

## 2021-05-14 IMAGING — NM NM PULMONARY VENT & PERF
8 series · 8 of 8 positions shown · non-contrast
Comparison: None.

CLINICAL DATA: High probability pulmonary embolism, dyspnea,
syncope.

EXAM:
NUCLEAR MEDICINE PERFUSION LUNG SCAN
TECHNIQUE: Perfusion images were obtained in multiple projections after
intravenous injection of radiopharmaceutical.
Ventilation scans intentionally deferred if perfusion scan and chest
x-ray adequate for interpretation during COVID 19 epidemic.
RADIOPHARMACEUTICALS:  4.31 mCi [2O] MAA IV

[Series 1: ant/post perf · 4.14mm/px · 1 of 1 slices shown (1 of 2)]
[im 1/1]
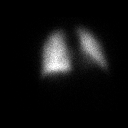

[Series 1: ant/post perf · 4.14mm/px · 1 of 1 slices shown (2 of 2)]
[im 1/1]
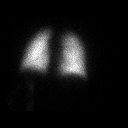

[Series 2: lao/rpo perf · 4.14mm/px · 1 of 1 slices shown (1 of 2)]
[im 1/1]
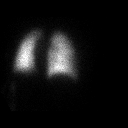

[Series 2: lao/rpo perf · 4.14mm/px · 1 of 1 slices shown (2 of 2)]
[im 1/1]
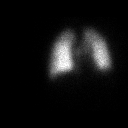

[Series 3: lpo/rao perf · 4.14mm/px · 1 of 1 slices shown (1 of 2)]
[im 1/1]
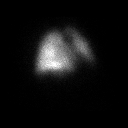

[Series 3: lpo/rao perf · 4.14mm/px · 1 of 1 slices shown (2 of 2)]
[im 1/1]
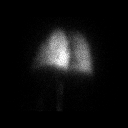

[Series 4: lt lat/rt lat perf · 4.14mm/px · 1 of 1 slices shown (1 of 2)]
[im 1/1]
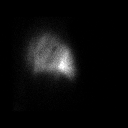

[Series 4: lt lat/rt lat perf · 4.14mm/px · 1 of 1 slices shown (2 of 2)]
[im 1/1]
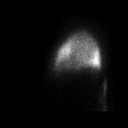

[8 of 8 positions shown; findings below may reference images not displayed]

FINDINGS: There is a normal distribution of radiotracer within the lungs
bilaterally. No perfusion defects are identified.
IMPRESSION: Normal examination.  No evidence of pulmonary embolism.

## 2021-05-14 MED ORDER — TAMSULOSIN HCL 0.4 MG PO CAPS
0.4000 mg | ORAL_CAPSULE | Freq: Every day | ORAL | Status: DC
  Administered 2021-05-14 – 2021-05-15 (×2): 0.4 mg via ORAL
  Filled 2021-05-14 (×3): qty 1

## 2021-05-14 MED ORDER — ASPIRIN EC 81 MG PO TBEC
81.0000 mg | DELAYED_RELEASE_TABLET | Freq: Every day | ORAL | Status: DC
  Administered 2021-05-15 – 2021-05-16 (×2): 81 mg via ORAL
  Filled 2021-05-14 (×2): qty 1

## 2021-05-14 MED ORDER — SODIUM CHLORIDE 0.9 % IV SOLN
75.0000 mL/h | INTRAVENOUS | Status: AC
  Administered 2021-05-14 (×2): 75 mL/h via INTRAVENOUS

## 2021-05-14 MED ORDER — ACETAMINOPHEN 650 MG RE SUPP: 650.0000 mg | Freq: Four times a day (QID) | RECTAL | Status: DC | PRN

## 2021-05-14 MED ORDER — ACETAMINOPHEN 325 MG PO TABS: 650.0000 mg | ORAL_TABLET | Freq: Four times a day (QID) | ORAL | Status: DC | PRN

## 2021-05-14 MED ORDER — TECHNETIUM TO 99M ALBUMIN AGGREGATED
4.3100 | Freq: Once | INTRAVENOUS | Status: AC | PRN
  Administered 2021-05-14: 4.31 via INTRAVENOUS

## 2021-05-14 MED ORDER — LORAZEPAM 0.5 MG PO TABS
0.5000 mg | ORAL_TABLET | Freq: Three times a day (TID) | ORAL | Status: DC | PRN
  Administered 2021-05-14 – 2021-05-15 (×2): 0.5 mg via ORAL
  Filled 2021-05-14 (×2): qty 1

## 2021-05-14 MED ORDER — LORAZEPAM 2 MG/ML IJ SOLN
1.0000 mg | Freq: Once | INTRAMUSCULAR | Status: AC
  Administered 2021-05-14: 1 mg via INTRAVENOUS
  Filled 2021-05-14: qty 1

## 2021-05-14 MED ORDER — PANTOPRAZOLE SODIUM 40 MG PO TBEC
40.0000 mg | DELAYED_RELEASE_TABLET | Freq: Every day | ORAL | Status: DC
  Administered 2021-05-14 – 2021-05-15 (×2): 40 mg via ORAL
  Filled 2021-05-14 (×3): qty 1

## 2021-05-14 MED ORDER — SIMVASTATIN 20 MG PO TABS
40.0000 mg | ORAL_TABLET | Freq: Every day | ORAL | Status: DC
  Administered 2021-05-14 – 2021-05-16 (×3): 40 mg via ORAL
  Filled 2021-05-14 (×3): qty 2

## 2021-05-14 MED ORDER — INSULIN ASPART 100 UNIT/ML IJ SOLN
0.0000 [IU] | INTRAMUSCULAR | Status: DC
  Administered 2021-05-15: 2 [IU] via SUBCUTANEOUS
  Administered 2021-05-15 (×2): 1 [IU] via SUBCUTANEOUS
  Administered 2021-05-17: 2 [IU] via SUBCUTANEOUS

## 2021-05-14 NOTE — Progress Notes (Signed)
IR was requested for image guided replacement of perc chole tube, as patient pulled it out.   Dr. Katrinka Blazing dictation from 7/15 states "restored patency of the cystic duct and common bile ducts in the setting of a calculus cholecystitis, patient is now a candidate for physiologic capped trial."   Plan was to bring the patient back to IR drain clinic around 7/23 and remove the drain if pt tolerated capping trial well. Chart reviewed, pt denied abdominal tenderness, no leukocytosis, AST and ALT normal, alk phos 141 t bili 1.3.   Ordering provider informed above recommendation, asked to contact IR if a new perc chole drain placement is indicated.    Will delete the IR Rad eval order.  Please call IR for questions and concerns.   Armando Gang Guliana Weyandt PA-C 05/14/2021 3:23 PM

## 2021-05-14 NOTE — Progress Notes (Signed)
Brief progress note: Patient was admitted earlier today with worsening confusion. There are concerns for likely UTI  As per H&P done by Dr. Doug Sou: "Vincent [name] is a 70 y.o. male with medical history significant of  hypertension, diabetes mellitus type 2, unspecified stroke in 01/2021 with residual speech difficulty and being bedbound, chronic combined heart failure, CAD, prior UTIs, depression, anxiety, chronic cholecystitis needing cholecystostomy tube     Presented with   worsening confusion as per family.  Has been more lethargic.  Has not been eating and drinking as much as before.  Seems to be similar to prior presentation when he has a UTI.   During his last admission his cholecystectomy tube was replaced he was treated with Rocephin for UTI and was able to be discharged to home on 16 July. SNF was recommended but wife declined Patient has known history of recurrent UTIs previous 1 grew E. coli sensitive to Rocephin".  -Patient is currently on IV Rocephin. -Urine cultures are pending. -Patient pulled up his biliary drain. -Discussed with the interventional radiology team.   -Apparently, the biliary drain was going to be removed by the interventional radiology team earlier.  Further management depend on hospital course.

## 2021-05-14 NOTE — Consult Note (Signed)
Consultation Note Date: 05/14/2021   Patient Name: Vincent [name]  DOB: 10-21-1950  MRN: [id number]  Age / Sex: 70 y.o., male  PCP: Ginger Organ., MD Referring Physician: Toy Baker, MD  Reason for Consultation: Establishing goals of care  HPI/Patient Profile: 70 y.o. male  with past medical history of recent stroke (April 2022) with residual aphasia and weakness, chronic combined heart failure, hypertension, CAD, diabetes type 2, prior UTIs, and chronic cholecystitis needing cholecystostomy tube. He presented to the emergency department on 05/13/2021 with worsening confusion, lethargy, and decreased oral intake. In the ED, head CT was unremarkable. Urinalysis showed evidence of UTI and he was started on Rocephin.   Patient has had 5 inpatient admissions in the last 6 months.   Clinical Assessment and Goals of Care: I have reviewed medical records including EPIC notes, labs and imaging, and examined the patient. He is in bilateral wrist restraints for attempting to pull at his lines and tubes while in the ED. He is oriented to self only. His speech is garbled and difficult to understand.   I spoke with his wife [name] by phone to discuss diagnosis, prognosis, [place], EOL wishes, disposition, and options. I introduced Palliative Medicine as specialized medical care for people living with serious illness. It focuses on providing relief from the symptoms and stress of a serious illness.   We discussed a brief life review of the patient. He is originally from [place]. He and [name] have been married for over 28 years. They do not have children. 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 Before his health declined, he is describe as very active. He loved fishing, driving around, and eating at [employer].   As far as functional status, there has been a  significant decline since his stroke in April. He is bed-bound. He is dependent for all ADLs, with the exception that he is able to feed himself. He is incontinent of bowel and bladder. [name] reports he has intermittent confusion at baseline.   We discussed his current illness and what it means in the larger context of his ongoing co-morbidities.  Natural disease trajectory of chronic illness and debility was discussed. [name] verbalizes understanding of his current debilitated state and ongoing decline. She understands he will continue to have recurrent UTIs secondary to neurogenic bladder. He has been offered surgical intervention for chronic cholecystitis, but [name] does not think he would survive this.    I attempted to elicit values and goals of care important to the patient and family. [name] has been his primary caregiver. It has been important for him to remain at home. She does not want him in a SNF.   The difference between full scope medical intervention and comfort care was considered.  I introduced the concept of a comfort path to [name], emphasizing this means stopping full scope medical interventions, allowing a natural course to occur. Discussed that the goal is comfort and dignity rather than cure/prolonging life. Encouraged [name] to consider at  what point she would want to stop full scope medical interventions, keeping in mind the concept of quality of life.   Provided education and counseling on the philosophy and benefits of hospice care. Discussed that it offers a holistic approach to care in the setting of end-stage illness/disease, and is about supporting the patient where they are allowing nature to take it's course. Discussed the hospice team includes RNs, physicians, social workers, and chaplains. They can provide personal care, support for the family, and help keep patient out of the hospital. [name] shares that she has had several family members under  hospice care and they were all positive experiences.   Plan is for continue supportive care for now with goal to medically optimize patient. If patient were to decline in the hospital, I recommended no escalation of care and would transition to comfort care at that time - [name] agrees. She would like to proceed with referral for hospice services at home.   Primary decision maker: [name]    SUMMARY OF RECOMMENDATIONS   Continue current care with goal to medically optimize patient No escalation of care if patient were to decline TOC order placed for home hospice services - family preference for [facility] PMT will continue to follow  Code Status/Advance Care Planning: DNR  Palliative Prophylaxis:  Oral Care and Turn Reposition  Additional Recommendations (Limitations, Scope, Preferences): Full Scope Treatment  Psycho-social/Spiritual:  Created space and opportunity for patient and family to express thoughts and feelings regarding patient's current medical situation.  Emotional support provided   Prognosis:  < 6 months  Discharge Planning: Home with Hospice      Primary Diagnoses: Present on Admission:  UTI (urinary tract infection)  Severe sepsis (HCC)  Hypertension  HLD (hyperlipidemia)  Diabetes 1.5, managed as type 2 (HCC)  Chronic systolic CHF (congestive heart failure) (HCC)  Chronic cholecystitis  Acute encephalopathy   I have reviewed the medical record, interviewed the patient and family, and examined the patient. The following aspects are pertinent.  Past Medical History:  Diagnosis Date   Acute MI ([place])    Allergy    Anxiety    Arthritis    Coronary artery disease    Depression    Diabetes mellitus without complication (HCC)    Diverticulosis    mild   Hemorrhoids    HOH (hard of hearing)    Hx of colonic polyps    Hyperlipidemia    Hypertension    Left ventricular dysfunction    hx of     Family History  Problem  Relation Age of Onset   Coronary artery disease Other        positive family hx of   Colon cancer Neg Hx    Esophageal cancer Neg Hx    Liver cancer Neg Hx    Pancreatic cancer Neg Hx    Rectal cancer Neg Hx    Stomach cancer Neg Hx    Scheduled Meds:  [START ON 05/15/2021] aspirin EC  81 mg Oral QHS   insulin aspart  0-9 Units Subcutaneous Q4H   pantoprazole  40 mg Oral Daily   simvastatin  40 mg Oral QHS   tamsulosin  0.4 mg Oral Daily   Continuous Infusions:  sodium chloride 75 mL/hr (05/14/21 1745)   cefTRIAXone (ROCEPHIN)  IV 1 g (05/14/21 1825)   PRN Meds:.acetaminophen **OR** acetaminophen, LORazepam   Allergies  Allergen Reactions   Penicillins Other (See Comments)    Disputed in 2022- Has tolerated  cephalexin in the past  Has patient had a PCN reaction causing immediate rash, facial/tongue/throat swelling, SOB or lightheadedness with hypotension: Unknown Has patient had a PCN reaction causing severe rash involving mucus membranes or skin necrosis: Unknown Has patient had a PCN reaction that required hospitalization: Unknown Has patient had a PCN reaction occurring within the last 10 years: No     Review of Systems  Unable to perform ROS: Mental status change   Physical Exam Vitals reviewed.  Constitutional:      General: He is not in acute distress.    Appearance: He is ill-appearing.  Cardiovascular:     Rate and Rhythm: Tachycardia present.  Pulmonary:     Effort: Pulmonary effort is normal.  Neurological:     Mental Status: He is alert. He is confused.  Psychiatric:        Cognition and Memory: Cognition is impaired.     Comments: Garbled speech    Vital Signs: BP 124/83 (BP Location: Right Arm)   Pulse (!) 102   Temp 98.3 F (36.8 C) (Oral)   Resp 18   Ht '5\' 4"'$  (1.626 m)   Wt 68 kg   SpO2 98%   BMI 25.73 kg/m  Pain Scale: Faces       SpO2: SpO2: 98 % O2 Device:SpO2: 98 % O2 Flow Rate: .    Palliative Assessment/Data: PPS  30%     Time In: 1730 Time Out: 1843 Time Total: 73 minutes Greater than 50%  of this time was spent counseling and coordinating care related to the above assessment and plan.  Signed by: Lavena Bullion, NP   Please contact Palliative Medicine Team phone at [phone] for questions and concerns.  For individual provider: See Shea Evans

## 2021-05-14 NOTE — ED Notes (Signed)
Pt pulled out all lines, including biliary tube that he arrived with.  MD notified.

## 2021-05-14 NOTE — ED Notes (Signed)
Pt transported to IR 

## 2021-05-14 NOTE — TOC Initial Note (Signed)
Transition of Care [facility]) - Initial/Assessment Note    Patient Details  Name: Vincent [name] MRN: [id number] Date of Birth: [birth year]  [facility] CM/SW Contact:    Verdell Carmine, RN Phone Number: 05/14/2021, 11:21 AM  Clinical Narrative:                  Patient readmitted to hospital after being at home a couple of days. He is confused, in restraints, SNF was recommended last admission, wife declined. PT evaluation placed for this admission. CM/CSW to follow for transitions and needs   Expected Discharge Plan: [place] Barriers to Discharge: Continued Medical Work up   Patient Goals and CMS Choice        Expected Discharge Plan and Services Expected Discharge Plan: [place] arrangements for the past 2 months: [place]                                      Prior Living Arrangements/Services Living arrangements for the past 2 months: Single Family Home Lives with:: Spouse Patient language and need for interpreter reviewed:: Yes        Need for Family Participation in Patient Care: Yes (Comment) Care giver support system in place?: Yes (comment)   Criminal Activity/Legal Involvement Pertinent to Current Situation/Hospitalization: No - Comment as needed  Activities of Daily Living      Permission Sought/Granted                  Emotional Assessment       Orientation: : Fluctuating Orientation (Suspected and/or reported Sundowners) Alcohol / Substance Use: Not Applicable Psych Involvement: No (comment)  Admission diagnosis:  Sepsis ([place]) [A41.9] Patient Active Problem List   Diagnosis Date Noted   Sepsis ([place]) 05/13/2021   Pressure injury of skin 04/21/2021   UTI (urinary tract infection) 03/14/2021   AKI (acute kidney injury) ([place]) 03/14/2021   Acute encephalopathy 03/14/2021   Hypokalemia 03/14/2021   Depression    Anxiety    Severe sepsis with septic shock ([place])  02/06/2021   Chronic cholecystitis 02/06/2021   Sacral decubitus ulcer 02/06/2021   Severe sepsis ([place]) 02/06/2021   Vitamin B12 deficiency 01/27/2021   Diabetes 1.5, managed as type 2 ([place]) [id number]   Chronic systolic CHF (congestive heart failure) ([place]) 01/27/2021   CVA (cerebral vascular accident) ([place]) 01/12/2021   Weakness    Precordial pain    Right knee pain 01/07/2018   History of total right knee replacement 01/07/2018   SOB (shortness of breath) 08/12/2016   Tachycardia 08/12/2016   Low grade fever 08/12/2016   Osteoarthritis of right knee 08/09/2016   OSA (obstructive sleep apnea) 11/29/2011   Syncope 07/24/2011   ARM NUMBNESS 06/19/2010   EDEMA 03/13/2010   FASTING HYPERGLYCEMIA 03/13/2010   CORONARY ATHEROSCLEROSIS NATIVE CORONARY ARTERY 06/20/2009   COLONIC POLYPS, ADENOMATOUS 01/31/2009   HLD (hyperlipidemia) 01/31/2009   Hypertension 01/31/2009   Coronary atherosclerosis 01/31/2009   HEMORRHOIDS 01/31/2009   DIVERTICULOSIS, MILD 01/31/2009   PCP:  Ginger Organ., MD Pharmacy:   [facility], [place] [address] Phone: [phone] Fax: [phone]  [id number] [id number] [id number]  Phone: [phone] Fax: [phone]     Social Determinants of Health (SDOH) Interventions    Readmission Risk Interventions No flowsheet data found.

## 2021-05-14 NOTE — ED Notes (Signed)
EVS notified this Probation officer, who was sitting with another pt, that the pt in ED RM 30 was sliding off of his bed. When this writer walked into the room, pt had turned to the bedside rails and had voided all over himself and the floor while on the bed. This writer washed up the pt, applied an external condom catheter to pt, new briefs and changed his linens. Pt is now sitting up, eyes closed and calm. RN Hassan Rowan made aware.

## 2021-05-14 NOTE — H&P (Signed)
BRYANT SAYE UEK:[id number] DOB: 1951/04/17 DOA: 05/13/2021      PCP: Ginger Organ., MD      Patient arrived to ER on 05/13/21 at [facility] Referred by Attending Toy Baker, MD   Patient coming from: home Lives  With family    Chief Complaint:   Chief Complaint  Patient presents with   Altered Mental Status    HPI: Vincent [name] is a 70 y.o. male with medical history significant of  hypertension, diabetes mellitus type 2, unspecified stroke in 01/2021 with residual speech difficulty and being bedbound, chronic combined heart failure, CAD, prior UTIs, depression, anxiety, chronic cholecystitis needing cholecystostomy tube    Presented with   worsening confusion as per family.  Has been more lethargic.  Has not been eating and drinking as much as before.  Seems to be similar to prior presentation when he has a UTI.  During his last admission his cholecystectomy tube was replaced he was treated with Rocephin for UTI and was able to be discharged to home on 16 July. SNF was recommended but wife declined Patient has known history of recurrent UTIs previous 1 grew E. coli sensitive to Rocephin.    Has  been vaccinated against COVID    Initial COVID TEST  NEGATIVE   Lab Results  Component Value Date   [place] NEGATIVE 05/13/2021   [place] NEGATIVE 04/20/2021   [place] NEGATIVE 03/14/2021   [place] NEGATIVE 02/11/2021     Regarding pertinent Chronic problems:     Hyperlipidemia -  on statins zocor Lipid Panel     Component Value Date/Time   CHOL 118 01/12/2021 2221   TRIG 61 01/12/2021 2221   HDL 46 01/12/2021 2221   CHOLHDL 2.6 01/12/2021 2221   VLDL 12 01/12/2021 2221   LDLCALC 60 01/12/2021 2221      DM 2 -  Lab Results  Component Value Date   HGBA1C 4.9 04/21/2021     diet controlled     Hx of CVA -  with/ residual deficits on Aspirin 81        BPH - on Flomax,    Dementia/anxiety- on Ativan and Seroquel   Chronic  cholecystitis status post -gallbladder drain   While in ER: Noted to be tachycardic with soft blood pressures and somewhat lethargic CT head unremarkable UA showed evidence of UTI started on Rocephin    ED Triage Vitals [05/13/21 1900]  Enc Vitals Group     BP 127/79     Pulse Rate (!) 107     Resp 18     Temp 98.1 F (36.7 C)     Temp Source Rectal     SpO2 95 %     Weight      Height      Head Circumference      Peak Flow      Pain Score      Pain Loc      Pain Edu?      Excl. in [place]?   XTAV(69)@     _________________________________________ Significant initial  Findings: Abnormal Labs Reviewed  COMPREHENSIVE METABOLIC PANEL - Abnormal; Notable for the following components:      Result Value   CO2 21 (*)    Total Protein 5.9 (*)    Albumin 2.8 (*)    Alkaline Phosphatase 141 (*)    Total Bilirubin 1.3 (*)    All other components within normal limits  CBC WITH DIFFERENTIAL/PLATELET - Abnormal; Notable  for the following components:   RDW 15.7 (*)    All other components within normal limits  URINALYSIS, ROUTINE W REFLEX MICROSCOPIC - Abnormal; Notable for the following components:   APPearance HAZY (*)    Ketones, ur 20 (*)    Nitrite POSITIVE (*)    Leukocytes,Ua LARGE (*)    WBC, UA >50 (*)    Bacteria, UA RARE (*)    All other components within normal limits  I-STAT ARTERIAL BLOOD GAS, ED - Abnormal; Notable for the following components:   Potassium 3.3 (*)    HCT 37.0 (*)    Hemoglobin 12.6 (*)    All other components within normal limits  I-STAT CHEM 8, ED - Abnormal; Notable for the following components:   BUN 6 (*)    Creatinine, Ser 0.50 (*)    All other components within normal limits   ____________________________________________ Ordered CT HEAD   NON acute  CXR -  NON acute   ECG: Ordered Personally reviewed by me showing: HR : 108 Rhythm:  Sinus tachycardia    no evidence of ischemic changes QTC 498 ____________________ This patient  meets SIRS Criteria and may be septic.   The recent clinical data is shown below. Vitals:   05/13/21 1900 05/13/21 2100 05/13/21 2315  BP: 127/79 132/77 (!) 120/57  Pulse: (!) 107 (!) 104 100  Resp: _0 Temp: 98.1 F (36.7 C)    TempSrc: Rectal    SpO2: 95% 100% 100%      WBC     Component Value Date/Time   WBC 6.0 05/13/2021 1920   LYMPHSABS 1.2 05/13/2021 1920   MONOABS 0.8 05/13/2021 1920   EOSABS 0.1 05/13/2021 1920   BASOSABS 0.0 05/13/2021 1920        Lactic Acid, Venous    Component Value Date/Time   LATICACIDVEN 1.6 05/13/2021 2300     Procalcitonin   Ordered    UA  evidence of UTI    Urine analysis:    Component Value Date/Time   COLORURINE YELLOW 05/13/2021 1853   APPEARANCEUR HAZY (A) 05/13/2021 1853   LABSPEC 1.010 05/13/2021 1853   PHURINE 6.0 05/13/2021 1853   GLUCOSEU NEGATIVE 05/13/2021 1853   HGBUR NEGATIVE 05/13/2021 1853   BILIRUBINUR NEGATIVE 05/13/2021 1853   KETONESUR 20 (A) 05/13/2021 1853   PROTEINUR NEGATIVE 05/13/2021 1853   UROBILINOGEN 0.2 01/06/2014 1257   NITRITE POSITIVE (A) 05/13/2021 1853   LEUKOCYTESUR LARGE (A) 05/13/2021 1853    Results for orders placed or performed during the hospital encounter of 05/13/21  Resp Panel by RT-PCR (Flu A&B, Covid) Nasopharyngeal Swab     Status: None   Collection Time: 05/13/21  8:45 PM   Specimen: Nasopharyngeal Swab; Nasopharyngeal(NP) swabs in vial transport medium  Result Value Ref Range Status   SARS Coronavirus 2 by RT PCR NEGATIVE NEGATIVE Final         Influenza A by PCR NEGATIVE NEGATIVE Final   Influenza B by PCR NEGATIVE NEGATIVE Final            _______________________________________________ Hospitalist was called for admission for sepsis due to UTI  The following Work up has been ordered so far:  Orders Placed This Encounter  Procedures   ED FAST [place] BEDSIDE   Critical Care   Resp Panel by RT-PCR (Flu A&B, Covid) Nasopharyngeal Swab   Blood Culture  (routine x 2)   Urine Culture   DG Chest Port 1 View   CT HEAD WO  CONTRAST (5MM)   Lactic acid, plasma   Comprehensive metabolic panel   CBC WITH DIFFERENTIAL   Urinalysis, Routine w reflex microscopic   Protime-INR   APTT   Urine rapid drug screen (hosp performed)   Lactic acid, plasma   Procalcitonin   Hemoglobin A1c   Diet NPO time specified   Cardiac monitoring   Document height and weight   Assess and Document Glasgow Coma Scale   Document vital signs within 1-hour of fluid bolus completion. Notify provider of abnormal vital signs despite fluid resuscitation.   DO NOT delay antibiotics if unable to obtain blood culture.   Refer to Sidebar Report: Sepsis Sidebar ED/IP   Notify provider for difficulties obtaining IV access.   Insert peripheral IV x 2   Initiate Carrier Fluid Protocol   Check Rectal Temperature   In and Out Cath   Bladder scan   Cardiac monitoring   Refer to Sidebar Report: Sepsis Bundle ED/IP   If lactate (lactic acid) >2, verify repeat lactic acid order has been placed to be drawn   Document vital signs within 1-hour of fluid bolus completion and notify provider of bolus completion   Vital signs   Vital signs   Assess and Document Glasgow Coma Scale   STAT CBG when hypoglycemia is suspected. If treated, recheck every 15 minutes after each treatment until CBG >/= 70 mg/dl   Refer to Hypoglycemia Protocol Sidebar Report for treatment of CBG < 70 mg/dl   No basal insulin at this time   Do not attempt resuscitation/DNR   Code Sepsis activation.  This occurs automatically when order is signed and prioritizes pharmacy, lab, and radiology services for STAT collections and interventions.  If [facility] downtime, call [facility] [phone]) to activate Code Sepsis.   Consult to hospitalist   Pharmacy Consult   Nutritional services consult   Consult to Transition of Care Team   Consult to Palliative Care   Pulse oximetry, continuous   I-Stat arterial blood gas, ED    CBG monitoring, ED   I-stat chem 8, ED (not at [facility] or [facility])   EKG 12-Lead   ED EKG 12-Lead   Admit to Inpatient (patient's expected length of stay will be greater than 2 midnights or inpatient only procedure)      Following Medications were ordered in ER: Medications  cefTRIAXone (ROCEPHIN) 1 g in sodium chloride 0.9 % 100 mL IVPB (0 g Intravenous Stopped 05/13/21 2012)  insulin aspart (novoLOG) injection 0-9 Units (has no administration in time range)  lactated ringers bolus 1,000 mL (0 mLs Intravenous Stopped 05/13/21 2026)  lactated ringers bolus 1,000 mL (1,000 mLs Intravenous New Bag/Given 05/13/21 1927)  lactated ringers bolus 1,000 mL (1,000 mLs Intravenous New Bag/Given 05/13/21 2304)        Consult Orders  (From admission, onward)           Start     Ordered   05/15/21 0101  Nutritional services consult  Once       Provider:  (Not yet assigned)  Question:  Reason for Consult?  Answer:  Assess nutritional status   05/14/21 0103   05/14/21 0103  Consult to Palliative Care  Once       Provider:  (Not yet assigned)  Question Answer [facility] Consult   Reason for Consult? goals of care      05/14/21 0103   05/13/21 2202  Consult to hospitalist  Paged by Jerene Pitch  Once  Provider:  (Not yet assigned)  Question Answer Comment  Place call to: [facility]   Reason for Consult Admit      05/13/21 2201              OTHER Significant initial  Findings:  labs showing:    Recent Labs  Lab 05/13/21 1920 05/13/21 1945 05/13/21 2105  NA 135 137 138  K 3.7 3.7 3.3*  CO2 21*  --   --   GLUCOSE 87 85  --   BUN 8 6*  --   CREATININE 0.75 0.50*  --   CALCIUM 9.1  --   --     Cr  stable,   Lab Results  Component Value Date   CREATININE 0.50 (L) 05/13/2021   CREATININE 0.75 05/13/2021   CREATININE 0.46 (L) 04/22/2021    Recent Labs  Lab 05/13/21 1920  AST 22  ALT 9  ALKPHOS 141*  BILITOT 1.3*   PROT 5.9*  ALBUMIN 2.8*   Lab Results  Component Value Date   CALCIUM 9.1 05/13/2021           Plt: Lab Results  Component Value Date   PLT 283 05/13/2021    Venous  Blood Gas result:  pH 7.382 pCO2 39.4;   ABG    Component Value Date/Time   PHART 7.382 05/13/2021 2105   PCO2ART 39.4 05/13/2021 2105   PO2ART 96 05/13/2021 2105   HCO3 23.4 05/13/2021 2105   TCO2 25 05/13/2021 2105   ACIDBASEDEF 2.0 05/13/2021 2105   O2SAT 97.0 05/13/2021 2105       Recent Labs  Lab 05/13/21 1920 05/13/21 1945 05/13/21 2105  WBC 6.0  --   --   NEUTROABS 3.8  --   --   HGB 14.9 14.3 12.6*  HCT 47.3 42.0 37.0*  MCV 91.0  --   --   PLT 283  --   --     HG/HCT  stable,     Component Value Date/Time   HGB 12.6 (L) 05/13/2021 2105   HCT 37.0 (L) 05/13/2021 2105   MCV 91.0 05/13/2021 1920      Cardiac Panel (last 3 results) No results for input(s): CKTOTAL, CKMB, TROPONINI, RELINDX in the last 72 hours.   BNP (last 3 results) Recent Labs    01/24/21 0353 01/25/21 0514 01/26/21 0238  BNP 242.2* 43.6 38.3     DM  labs:  HbA1C: Recent Labs    01/12/21 2221 04/21/21 0345  HGBA1C 7.2* 4.9      CBG (last 3)  Recent Labs    05/13/21 2045  GLUCAP 85        Cultures:    Component Value Date/Time   SDES  04/20/2021 2056    BLOOD RIGHT HAND Blood Culture adequate volume BOTTLES DRAWN AEROBIC ONLY Performed at [facility] [address], [place], [place] [address]    SPECREQUEST  04/20/2021 2056    NONE Performed at [facility], [place] [address], [place], [place] [address]    CULT  04/20/2021 2056    NO GROWTH 5 DAYS Performed at [facility] [address] [place], [place] [address]    REPTSTATUS 04/25/2021 FINAL 04/20/2021 2056     Radiological Exams on Admission: CT HEAD WO CONTRAST (5MM)  Result Date: 05/13/2021 CLINICAL DATA:  Altered mental status for 2 days EXAM: CT HEAD WITHOUT CONTRAST TECHNIQUE:  Contiguous axial images were obtained from the base of the skull  through the vertex without intravenous contrast. COMPARISON:  03/14/2021 FINDINGS: Brain: No evidence of acute infarction, hemorrhage, hydrocephalus, extra-axial collection or mass lesion/mass effect. Chronic white matter ischemic changes are noted. Vascular: No hyperdense vessel or unexpected calcification. Skull: Normal. Negative for fracture or focal lesion. Sinuses/Orbits: No acute finding. Other: None. IMPRESSION: Chronic white matter ischemic change without acute intracranial abnormality. Electronically Signed   By: Inez Catalina M.D.   On: 05/13/2021 19:53   DG Chest Port 1 View  Result Date: 05/13/2021 CLINICAL DATA:  Questionable sepsis EXAM: PORTABLE CHEST 1 VIEW COMPARISON:  04/20/2021 FINDINGS: Heart and mediastinal contours are within normal limits. No focal opacities or effusions. No acute bony abnormality. IMPRESSION: No active disease. Electronically Signed   By: Rolm Baptise M.D.   On: 05/13/2021 19:45   _______________________________________________________________________________________________________ Latest  Blood pressure (!) 120/57, pulse 100, temperature 98.1 F (36.7 C), temperature source Rectal, resp. rate 20, SpO2 100 %.   Review of Systems:    Pertinent positives include:  confusion fatigue, weight loss   Constitutional:  No weight loss, night sweats, Fevers, chills,  HEENT:  No headaches, Difficulty swallowing,Tooth/dental problems,Sore throat,  No sneezing, itching, ear ache, nasal congestion, post nasal drip,  Cardio-vascular:  No chest pain, Orthopnea, PND, anasarca, dizziness, palpitations.no Bilateral lower extremity swelling  GI:  No heartburn, indigestion, abdominal pain, nausea, vomiting, diarrhea, change in bowel habits, loss of appetite, melena, blood in stool, hematemesis Resp:  no shortness of breath at rest. No dyspnea on exertion, No excess mucus, no productive cough, No non-productive  cough, No coughing up of blood.No change in color of mucus.No wheezing. Skin:  no rash or lesions. No jaundice GU:  no dysuria, change in color of urine, no urgency or frequency. No straining to urinate.  No flank pain.  Musculoskeletal:  No joint pain or no joint swelling. No decreased range of motion. No back pain.  Psych:  No change in mood or affect. No depression or anxiety. No memory loss.  Neuro: no localizing neurological complaints, no tingling, no weakness, no double vision, no gait abnormality, no slurred speech, no   All systems reviewed and apart from [place] all are negative _______________________________________________________________________________________________ Past Medical History:   Past Medical History:  Diagnosis Date   Acute MI ([place])    Allergy    Anxiety    Arthritis    Coronary artery disease    Depression    Diabetes mellitus without complication ([place])    Diverticulosis    mild   Hemorrhoids    HOH (hard of hearing)    Hx of colonic polyps    Hyperlipidemia    Hypertension    Left ventricular dysfunction    hx of     Past Surgical History:  Procedure Laterality Date   ANGIOPLASTY     and bare metal stent placement   ANKLE SURGERY Right 1986   i&d abscess- pt states "put a bone in it"   CORONARY ANGIOPLASTY WITH STENT PLACEMENT     IR EXCHANGE BILIARY DRAIN  04/21/2021   IR PERC CHOLECYSTOSTOMY  02/07/2021   KNEE ARTHROPLASTY Right 08/09/2016   Procedure: RIGHT TOTAL KNEE ARTHROPLASTY WITH COMPUTER NAVIGATION;  Surgeon: Rod Can, MD;  Location: [facility];  Service: Orthopedics;  Laterality: Right;  Needs RNFA   LEFT HEART CATH AND CORONARY ANGIOGRAPHY N/A 11/21/2018   Procedure: LEFT HEART CATH AND CORONARY ANGIOGRAPHY;  Surgeon: Burnell Blanks, MD;  Location: [facility];  Service: Cardiovascular;  Laterality: N/A;  Social History:     reports that he quit smoking about 15 years ago. His smoking use included cigars. He  quit smokeless tobacco use about 15 years ago. He reports that he does not drink alcohol and does not use drugs.     Family History:   Family History  Problem Relation Age of Onset   Coronary artery disease Other        positive family hx of   Colon cancer Neg Hx    Esophageal cancer Neg Hx    Liver cancer Neg Hx    Pancreatic cancer Neg Hx    Rectal cancer Neg Hx    Stomach cancer Neg Hx    ______________________________________________________________________________________________ Allergies: Allergies  Allergen Reactions   Penicillins Other (See Comments)    Disputed in 2022- Has tolerated cephalexin in the past  Has patient had a PCN reaction causing immediate rash, facial/tongue/throat swelling, SOB or lightheadedness with hypotension: Unknown Has patient had a PCN reaction causing severe rash involving mucus membranes or skin necrosis: Unknown Has patient had a PCN reaction that required hospitalization: Unknown Has patient had a PCN reaction occurring within the last 10 years: No       Prior to Admission medications   Medication Sig Start Date End Date Taking? Authorizing Provider  aspirin EC 81 MG tablet Take 1 tablet (81 mg total) by mouth at bedtime. 03/18/21   Florencia Reasons, MD  LORazepam (ATIVAN) 1 MG tablet Take 1 mg by mouth every 8 (eight) hours as needed for anxiety.    [provider]  Magnesium Oxide (MAG-OXIDE) 200 MG TABS Take 1 tablet (200 mg total) by mouth daily at 12 noon. 03/18/21   Florencia Reasons, MD  metFORMIN (GLUCOPHAGE) 1000 MG tablet Take 1,000 mg by mouth 2 (two) times daily.    [provider]  pantoprazole (PROTONIX) 40 MG tablet Take 1 tablet (40 mg total) by mouth daily. 02/12/21   Lavina Hamman, MD  QUEtiapine (SEROQUEL) 25 MG tablet Take 1 tablet (25 mg total) by mouth at bedtime. 03/18/21   Florencia Reasons, MD  simvastatin (ZOCOR) 40 MG tablet Take 1 tablet (40 mg total) by mouth at bedtime. 11/04/17   Lyda Jester M, PA-C  tamsulosin  (FLOMAX) 0.4 MG CAPS capsule Take 1 capsule (0.4 mg total) by mouth daily. 03/18/21   Florencia Reasons, MD  traZODone (DESYREL) 50 MG tablet Take 50 mg by mouth at bedtime. 02/28/21   [provider]  vitamin B-12 (CYANOCOBALAMIN) 1000 MCG tablet Take 1 tablet (1,000 mcg total) by mouth daily. 01/27/21   Hosie Poisson, MD   ____________________________________________________________________________________________ Physical Exam: Vitals with BMI 05/13/2021 05/13/2021 05/13/2021  Height - - -  Weight - - -  BMI - - -  Systolic 962 229 798  Diastolic 57 77 79  Pulse 921 104 107     1. General:  in No  Acute distress   Chronically ill  cachectic   -appearing 2. Psychological: lethargic not Oriented 3. Head/ENT:    Dry Mucous Membranes                          Head Non traumatic, neck supple                           Poor Dentition 4. SKIN decreased Skin turgor,  Skin clean Dry and intact no rash 5. Heart: Regular rate and rhythm no  Murmur, no Rub or gallop 6. Lungs: no wheezes or crackles   7. Abdomen: Soft,  non-tender, Non distended  biliary drain in place 8. Lower extremities: no clubbing, cyanosis, no  edema 9. Neurologically Grossly intact, moving all 4 extremities equally  not well cooperative with exam   10. MSK: Normal range of motion   Chart has been reviewed  ______________________________________________________________________________________________  Assessment/Plan 70 y.o. male with medical history significant of  hypertension, diabetes mellitus type 2, unspecified stroke in 01/2021 with residual speech difficulty and being bedbound, chronic combined heart failure, CAD, prior UTIs, depression, anxiety, chronic cholecystitis needing cholecystostomy tube   Admitted for  sepsis due to UTI   Present on Admission:   Severe sepsis ([place]) -  -SIRS criteria met with   tachycardia    RR >20 Today's Vitals   05/13/21 1900 05/13/21 2100 05/13/21 2315  BP: 127/79 132/77 (!) 120/57   Pulse: (!) 107 (!) 104 100  Resp: _0 Temp: 98.1 F (36.7 C)    TempSrc: Rectal    SpO2: 95% 100% 100%      -Most likely source being: urinary    Patient meeting criteria for Severe sepsis with    evidence of end organ damage/organ dysfunction such as  acute metabolic encephalopathy   - Obtain serial lactic acid and procalcitonin level.  - Initiated IV antibiotics in ER: Antibiotics Given (last 72 hours)     Date/Time Action Medication Dose Rate   05/13/21 1942 New Bag/Given   cefTRIAXone (ROCEPHIN) 1 g in sodium chloride 0.9 % 100 mL IVPB 1 g 200 mL/hr    Will continue   q24h   - await results of blood and urine culture  - Rehydrate aggressively  Intravenous fluids were administered, lactated Ringer's 3000 ml's.  30cc/kg fluid   1:27 AM   UTI (urinary tract infection) - - treat with Rocephin  Pen allergic but tolerates cephalosporins       await results of urine culture and adjust antibiotic coverage as needed   Hypertension -allow permissive hypertension   HLD (hyperlipidemia) -restart home medications when able to tolerate.   Diabetes 1.5, managed as type 2 ([place]) - - Order Sensitive  SSI    -  check TSH and HgA1C      Chronic systolic CHF (congestive heart failure) ([place]) - - currently appears to be slightly on the dry side , carefuly follow fluid status and Cr   Chronic cholecystitis -sp biliary drain   Acute encephalopathy -   - most likely multifactorial secondary to combination of  infection   mild dehydration secondary to decreased by mouth intake,  polypharmacy   - Will rehydrate   - treat underlining infection   - Hold contributing medications   - if no improvement may need further imaging to evaluate for CNS pathology pathology such as MRI of the brain   - neurological exam appears to be nonfocal but patient unable to cooperate fully   - VBG unremarkable no evidence of hypercarbia    - no history of liver disease    Other plan as per  orders.  DVT prophylaxis:  SCD        Code Status:    Code Status: DNR   as per prior paperwork and ER MD discussion with famiy   Family Communication:   Family not at  Bedside    Disposition Plan:     likely will need placement for rehabilitation   Following barriers for discharge:  Electrolytes corrected                                                        able to transition to PO antibiotics                             Will need to be able to tolerate PO                            Will likely need home health, set up                     Would benefit from PT/OT eval prior to DC  Ordered                   Swallow eval - SLP ordered                                       Transition of care consulted                   Nutrition    consulted                                      Palliative care    consulted               Consults called: none  Admission status:  ED Disposition     ED Disposition  Admit   Condition  --   [place]: [place] [[id number]]  Level of Care: Progressive [102]  Admit to Progressive based on following criteria: MULTISYSTEM THREATS such as stable sepsis, metabolic/electrolyte imbalance with or without encephalopathy that is responding to early treatment.  May admit patient to [facility] or [facility] if equivalent level of care is available:: No  Covid Evaluation: Confirmed COVID Negative  Diagnosis: Sepsis [facility]) [[id number]]  Admitting Physician: Toy Baker [[id number]]  Attending Physician: Toy Baker [[id number]]  Estimated length of stay: past midnight tomorrow  Certification:: I certify this patient will need inpatient services for at least 2 midnights           inpatient     I Expect 2 midnight stay secondary to severity of patient's current illness need for inpatient interventions justified by the following:  hemodynamic instability despite optimal treatment (tachycardia   hypotension  )  Severe lab/radiological/exam abnormalities including:    Sepsis and extensive comorbidities including:    DM2     history of stroke with residual deficits    That are currently affecting medical management.   I expect  patient to be hospitalized for 2 midnights requiring inpatient medical care.  Patient is at high risk for adverse outcome (such as loss of life or disability) if not treated.  Indication for inpatient stay as follows:  Severe change from baseline regarding mental status   inability to maintain oral hydration     Need for IV antibiotics, IV fluids,     Level of care   progressive tele indefinitely please  discontinue once patient no longer qualifies COVID-19 Labs    Lab Results  Component Value Date   [place] NEGATIVE 05/13/2021     Precautions: admitted as  Covid Negative   PPE: Used by the provider:   N95  eye Goggles,  Gloves     Montine Hight 05/14/2021, 1:33 AM    [facility]     after 2 AM please page floor coverage PA If 7AM-7PM, please contact the day team taking care of the patient using [email or web address]   Patient was evaluated in the context of the global COVID-19 pandemic, which necessitated consideration that the patient might be at risk for infection with the SARS-CoV-2 virus that causes COVID-19. Institutional protocols and algorithms that pertain to the evaluation of patients at risk for COVID-19 are in a state of rapid change based on information released by regulatory bodies including the CDC and federal and state organizations. These policies and algorithms were followed during the patient's care.

## 2021-05-14 NOTE — Evaluation (Signed)
Physical Therapy Evaluation Patient Details Name: Vincent [name] MRN: [id number] DOB: 24-Aug-1951 Today's Date: 05/14/2021   History of Present Illness  Pt is a 70 y.o. male who presented 05/13/21 with AMS and decreased PO intake and urine output. Pt with UTI and acute encephalopathy. CT negative for acute intracranial abnormalities. PMH:  hypertension, diabetes mellitus type 2, unspecified stroke in 01/2021 with residual speech difficulty and being bedbound, chronic combined heart failure, CAD, prior UTIs, depression, anxiety, chronic cholecystitis needing cholecystostomy tube   Clinical Impression  Pt presents with condition above and deficits mentioned below, see PT Problem List. PTA, he was bedbound, dependent on his wife for all ADLs. Pt required extensive assistance for bed mobility and his wife has recently been unable to assist him up to sitting EOB. Pt's wife reports they just got a w/c, but have not used it yet. Currently, pt displays a high fear of falling, cognitive deficits, generalized weakness, lower extremity stiffness, coordination deficits, and balance deficits. He is at high risk for falls. Pt required maxA to transition supine <> sit and sit statically EOB for a brief period today. Pt can be self-limiting. Pt's wife expressed that she would desire to take pt home. If she decides to take him home rather than for pt to go to SNF for short-term rehab, then she will need max HH services, a hospital bed, and a mechanical lift to maximize his safety with mobility and decrease burden of care. Will continue to follow acutely.    Follow Up Recommendations SNF;Home health PT;Supervision/Assistance - 24 hour (likely to deny SNF; if home then max [facility] services)    Equipment Recommendations  Hospital bed;Other (comment) (mechanical lift)    Recommendations for Other Services       Precautions / Restrictions Precautions Precautions: Fall Precaution Comments: bil wrist  restraints Restrictions Weight Bearing Restrictions: No      Mobility  Bed Mobility Overal bed mobility: Needs Assistance Bed Mobility: Rolling;Supine to Sit;Sit to Supine Rolling: Max assist   Supine to sit: Max assist;HOB elevated Sit to supine: Max assist   General bed mobility comments: Pt agreeable then becomes resistive to movement, maxA to try to roll but no success. Pt asissting in moving buttocks and legs to sit up, needing maxA to complete leg transition and ascend trunk. Pt pushes posteriorly, apparently and reporting fear of falling. Returned pt to bed with maxA.    Transfers                 General transfer comment: pt declines  Ambulation/Gait             General Gait Details: Unable, recently bedbound  Financial trader Rankin (Stroke Patients Only) Modified Rankin (Stroke Patients Only) Pre-Morbid Rankin Score: Severe disability Modified Rankin: Severe disability     Balance Overall balance assessment: History of Falls;Needs assistance Sitting-balance support: Bilateral upper extremity supported;Feet unsupported Sitting balance-Leahy Scale: Zero Sitting balance - Comments: MaxA to prevent posterior LOB as pt pushing posteriorly, reporting and appearing to be fearful of falling. Only sitting briefly EOB before pt requesting to return to supine. Postural control: Posterior lean     Standing balance comment: Deferred                             Pertinent Vitals/Pain Pain Assessment: Faces Faces Pain Scale: Hurts even more Pain  Location: R knee, generalized grimacing with mobility Pain Descriptors / Indicators: Discomfort;Grimacing;Guarding Pain Intervention(s): Limited activity within patient's tolerance;Monitored during session;Repositioned    Home Living Family/patient expects to be discharged to:: Private residence Living Arrangements: Spouse/significant other Available Help at  Discharge: Family;Available 24 hours/day Type of Home: House Home Access: Ramped entrance     Home Layout: One level Home Equipment: Wheelchair - [employer] - 2 wheels;Cane - single point;Bedside commode      Prior Function Level of Independence: Needs assistance   Gait / Transfers Assistance Needed: wife reports pt has not stood since April. Wife has been unable to lift pt to sit EOB lately.  ADL's / Homemaking Assistance Needed: pt is dependent for bathring and dressing  Comments: fearful of falling     Hand Dominance   Dominant Hand: Right    Extremity/Trunk Assessment   Upper Extremity Assessment Upper Extremity Assessment: Generalized weakness    Lower Extremity Assessment Lower Extremity Assessment: Generalized weakness (stiffness noted in bil lower extremities, primarily with knee extension)    Cervical / Trunk Assessment Cervical / Trunk Assessment: Kyphotic  Communication   Communication: Expressive difficulties (mumbled speech)  Cognition Arousal/Alertness: Awake/alert Behavior During Therapy: Agitated;Restless;Impulsive Overall Cognitive Status: Impaired/Different from baseline Area of Impairment: Orientation;Attention;Memory;Following commands;Safety/judgement;Awareness;Problem solving                 Orientation Level: Disoriented to;Place;Time;Situation Current Attention Level: Selective Memory: Decreased short-term memory;Decreased recall of precautions Following Commands: Follows one step commands inconsistently;Follows one step commands with increased time Safety/Judgement: Decreased awareness of safety;Decreased awareness of deficits Awareness: Intellectual Problem Solving: Slow processing;Decreased initiation;Difficulty sequencing;Requires verbal cues;Requires tactile cues General Comments: Pt seeing "deep sea" fish on wall, notified RN. Repeatedly re-oriented pt. Pt forgetful of education not to try to pull at lines/leads, needing repeated  blocking as pt is impulsive, restless, and becomes agitated easily. Pt resistive to mobility but eventually agreeable to sit up. Increased time and max multi-modal cues to initiate and sequence taks, inconsistent in following cues.      General Comments      Exercises     Assessment/Plan    PT Assessment Patient needs continued PT services  PT Problem List Decreased strength;Decreased range of motion;Decreased activity tolerance;Decreased balance;Decreased mobility;Decreased coordination;Decreased cognition;Decreased knowledge of use of DME;Decreased safety awareness;Decreased knowledge of precautions;Impaired tone       PT Treatment Interventions DME instruction;Functional mobility training;Therapeutic activities;Therapeutic exercise;Balance training;Neuromuscular re-education;Cognitive remediation;Patient/family education;Wheelchair mobility training    PT Goals (Current goals can be found in the Care Plan section)  Acute Rehab PT Goals Patient Stated Goal: to drink soda PT Goal Formulation: With patient/family Time For Goal Achievement: 05/28/21 Potential to Achieve Goals: Fair    Frequency Min 3X/week   Barriers to discharge        Co-evaluation               AM-PAC PT "6 Clicks" Mobility  Outcome Measure Help needed turning from your back to your side while in a flat bed without using bedrails?: A Lot Help needed moving from lying on your back to sitting on the side of a flat bed without using bedrails?: A Lot Help needed moving to and from a bed to a chair (including a wheelchair)?: Total Help needed standing up from a chair using your arms (e.g., wheelchair or bedside chair)?: Total Help needed to walk in hospital room?: Total Help needed climbing 3-5 steps with a railing? : Total 6 Click Score: 8    End  of Session   Activity Tolerance: Treatment limited secondary to agitation;Other (comment) (limited by fear of falling) Patient left: in bed;with call  bell/phone within reach;with restraints reapplied Nurse Communication: Mobility status;Need for lift equipment PT Visit Diagnosis: Muscle weakness (generalized) (M62.81);History of falling (Z91.81);Difficulty in walking, not elsewhere classified (R26.2);Other symptoms and signs involving the nervous system (R29.898)    Time: 1435-1510 PT Time Calculation (min) (ACUTE ONLY): 35 min   Charges:   PT Evaluation $PT Eval Moderate Complexity: 1 Mod PT Treatments $Therapeutic Activity: 8-22 mins        Moishe Spice, PT, DPT Acute Rehabilitation Services  Pager: [phone] Office: [place] 05/14/2021, 3:34 PM

## 2021-05-14 NOTE — ED Notes (Signed)
Hospital bed ordered [phone]

## 2021-05-15 DIAGNOSIS — E43 Unspecified severe protein-calorie malnutrition: Secondary | ICD-10-CM

## 2021-05-15 DIAGNOSIS — E7800 Pure hypercholesterolemia, unspecified: Secondary | ICD-10-CM

## 2021-05-15 LAB — GLUCOSE, CAPILLARY
Glucose-Capillary: 100 mg/dL — ABNORMAL HIGH (ref 70–99)
Glucose-Capillary: 102 mg/dL — ABNORMAL HIGH (ref 70–99)
Glucose-Capillary: 122 mg/dL — ABNORMAL HIGH (ref 70–99)
Glucose-Capillary: 123 mg/dL — ABNORMAL HIGH (ref 70–99)
Glucose-Capillary: 154 mg/dL — ABNORMAL HIGH (ref 70–99)
Glucose-Capillary: 81 mg/dL (ref 70–99)

## 2021-05-15 LAB — CBC WITH DIFFERENTIAL/PLATELET
Abs Immature Granulocytes: 0.02 10*3/uL (ref 0.00–0.07)
Basophils Absolute: 0 10*3/uL (ref 0.0–0.1)
Basophils Relative: 1 %
Eosinophils Absolute: 0.1 10*3/uL (ref 0.0–0.5)
Eosinophils Relative: 2 %
HCT: 39.5 % (ref 39.0–52.0)
Hemoglobin: 12.4 g/dL — ABNORMAL LOW (ref 13.0–17.0)
Immature Granulocytes: 0 %
Lymphocytes Relative: 23 %
Lymphs Abs: 1.2 10*3/uL (ref 0.7–4.0)
MCH: 28.6 pg (ref 26.0–34.0)
MCHC: 31.4 g/dL (ref 30.0–36.0)
MCV: 91 fL (ref 80.0–100.0)
Monocytes Absolute: 0.8 10*3/uL (ref 0.1–1.0)
Monocytes Relative: 16 %
Neutro Abs: 3 10*3/uL (ref 1.7–7.7)
Neutrophils Relative %: 58 %
Platelets: 238 10*3/uL (ref 150–400)
RBC: 4.34 MIL/uL (ref 4.22–5.81)
RDW: 15.9 % — ABNORMAL HIGH (ref 11.5–15.5)
WBC: 5.2 10*3/uL (ref 4.0–10.5)
nRBC: 0 % (ref 0.0–0.2)

## 2021-05-15 LAB — COMPREHENSIVE METABOLIC PANEL
ALT: 11 U/L (ref 0–44)
AST: 17 U/L (ref 15–41)
Albumin: 2.2 g/dL — ABNORMAL LOW (ref 3.5–5.0)
Alkaline Phosphatase: 112 U/L (ref 38–126)
Anion gap: 7 (ref 5–15)
BUN: 5 mg/dL — ABNORMAL LOW (ref 8–23)
CO2: 27 mmol/L (ref 22–32)
Calcium: 8.8 mg/dL — ABNORMAL LOW (ref 8.9–10.3)
Chloride: 105 mmol/L (ref 98–111)
Creatinine, Ser: 0.65 mg/dL (ref 0.61–1.24)
GFR, Estimated: >60 mL/min (ref >60)
Glucose, Bld: 124 mg/dL — ABNORMAL HIGH (ref 70–99)
Potassium: 3.3 mmol/L — ABNORMAL LOW (ref 3.5–5.1)
Sodium: 139 mmol/L (ref 135–145)
Total Bilirubin: 0.7 mg/dL (ref 0.3–1.2)
Total Protein: 4.8 g/dL — ABNORMAL LOW (ref 6.5–8.1)

## 2021-05-15 LAB — PHOSPHORUS: Phosphorus: 2.5 mg/dL (ref 2.5–4.6)

## 2021-05-15 LAB — MAGNESIUM: Magnesium: 1.8 mg/dL (ref 1.7–2.4)

## 2021-05-15 MED ORDER — HALOPERIDOL LACTATE 5 MG/ML IJ SOLN: 2.0000 mg | Freq: Three times a day (TID) | INTRAMUSCULAR | Status: DC | PRN

## 2021-05-15 MED ORDER — ENSURE ENLIVE PO LIQD
237.0000 mL | Freq: Three times a day (TID) | ORAL | Status: DC
  Administered 2021-05-15 – 2021-05-16 (×3): 237 mL via ORAL

## 2021-05-15 MED ORDER — ORAL CARE MOUTH RINSE
15.0000 mL | Freq: Two times a day (BID) | OROMUCOSAL | Status: DC
  Administered 2021-05-15 – 2021-05-16 (×2): 15 mL via OROMUCOSAL

## 2021-05-15 MED ORDER — LORAZEPAM 0.5 MG PO TABS
0.5000 mg | ORAL_TABLET | Freq: Four times a day (QID) | ORAL | Status: DC | PRN
  Administered 2021-05-15: 0.5 mg via ORAL
  Filled 2021-05-15: qty 1

## 2021-05-15 MED ORDER — ADULT MULTIVITAMIN W/MINERALS CH
1.0000 | ORAL_TABLET | Freq: Every day | ORAL | Status: DC
  Administered 2021-05-15: 1 via ORAL
  Filled 2021-05-15 (×2): qty 1

## 2021-05-15 NOTE — Progress Notes (Signed)
SLP Cancellation Note  Patient Details Name: GREYLIN THORLEY MRN: [id number] DOB: Feb 13, 1951   Cancelled treatment:       Reason Eval/Treat Not Completed: Fatigue/lethargy limiting ability to participate. RN advises waiting on swallow eval as pt is still very lethargic from receiving ativan. Will f/u later as schedule allows.     Osie Bond., M.A. [place] [facility] Pager [phone] Office [phone]  05/15/2021, 9:29 AM

## 2021-05-15 NOTE — Progress Notes (Signed)
PROGRESS NOTE    Vincent [name]  [id number] DOB: 1951/03/04 DOA: 05/13/2021 PCP: Ginger Organ., MD   Brief Narrative:  Vincent [name] is a 70 y.o. WM PMHx   hypertension, diabetes mellitus type 2, unspecified stroke in 01/2021 with residual speech difficulty and being bedbound, chronic combined heart failure, CAD, prior UTIs, depression, anxiety, chronic cholecystitis needing cholecystostomy tube  Presented with   worsening confusion as per family.  Has been more lethargic.  Has not been eating and drinking as much as before.  Seems to be similar to prior presentation when he has a UTI.   During his last admission his cholecystectomy tube was replaced he was treated with Rocephin for UTI and was able to be discharged to home on 16 July. SNF was recommended but wife declined Patient has known history of recurrent UTIs previous 1 grew E. coli sensitive to Rocephin.     Subjective: Afebrile overnight   Assessment & Plan:  Covid vaccination;  Active Problems:   HLD (hyperlipidemia)   Hypertension   Diabetes 1.5, managed as type 2 (HCC)   Chronic systolic CHF (congestive heart failure) (HCC)   Chronic cholecystitis   Severe sepsis (HCC)   UTI (urinary tract infection)   Acute encephalopathy  UTI - 5-day course Rocephin - Cultures pending  Essential HTN - Currently patient's BP controlled without medication -Continue to monitor  HLD - Simvastatin 40 mg daily  DM type II controlled without complication. - 8/7 hemoglobin A1c= 4.8 - Sensitive SSI   Chronic systolic CHF - Strict in and out - Daily weight  Chronic cholecystitis - S/p biliary drain  Acute Encephalopathy with Agitation? -Unsure of patient's baseline. - Treat all above issues - 8/8 Haldol 2 mg TID PRN agitation  EtOH abuse? - Spoke at length with RN Young and it was reported to her that patient was an alcoholic.  This diagnosis not in patient's admission note. -Also not within patient's  problem list.  Will speak with family regarding patient's alcohol use.  Severe protein calorie malnutrition -  Goals of care - Awaiting palliative care recommendations given that patient has had 5 hospitalizations in the last 6 months.   DVT prophylaxis: SCD Code Status: DNR Family Communication:  Status is: Inpatient    Dispo: The patient is from: Home              Anticipated d/c is to: Awaiting recommendation from palliative care              Anticipated d/c date is: > 3 days              Patient currently is not medically stable to d/c.      Consultants:  Palliative care  Procedures/Significant Events:     I have personally reviewed and interpreted all radiology studies and my findings are as above.  VENTILATOR SETTINGS:    Cultures 8/6 SARS coronavirus negative 8/6 blood NGTD x2. 8/8 urine culture pending   Antimicrobials: Anti-infectives (From admission, onward)    Start     Dose/Rate Route Frequency Ordered Stop   05/13/21 1900  cefTRIAXone (ROCEPHIN) 1 g in sodium chloride 0.9 % 100 mL IVPB        1 g 200 mL/hr over 30 Minutes Intravenous Every 24 hours 05/13/21 1854           Devices    LINES / TUBES:      Continuous Infusions:  cefTRIAXone (ROCEPHIN)  IV Stopped (05/14/21 1855)  Objective: Vitals:   05/15/21 0200 05/15/21 0411 05/15/21 0707 05/15/21 0800  BP:  114/66 130/76 119/80  Pulse:    97  Resp: '16 20 20 18  '$ Temp:  97.6 F (36.4 C) 97.9 F (36.6 C) 98.1 F (36.7 C)  TempSrc:  Oral Oral Oral  SpO2:   98% 98%  Weight:  68 kg    Height:        Intake/Output Summary (Last 24 hours) at 05/15/2021 1044 Last data filed at 05/15/2021 0900 Gross per 24 hour  Intake 703.28 ml  Output 550 ml  Net 153.28 ml   Filed Weights   05/14/21 1740 05/15/21 0411  Weight: 68 kg 68 kg    Examination:  General: A/O x1 (does not know where, when, why) follows some commands, No acute respiratory distress Eyes: negative scleral  hemorrhage, negative anisocoria, negative icterus ENT: Negative Runny nose, negative gingival bleeding, Neck:  Negative scars, masses, torticollis, lymphadenopathy, JVD Lungs: Clear to auscultation bilaterally without wheezes or crackles Cardiovascular: Regular rate and rhythm without murmur gallop or rub normal S1 and S2 Abdomen: negative abdominal pain, nondistended, positive soft, bowel sounds, no rebound, no ascites, no appreciable mass Extremities: No significant cyanosis, clubbing, or edema bilateral lower extremities Skin: Negative rashes, lesions, ulcers Psychiatric:  Negative depression, negative anxiety, negative fatigue, negative mania, positive agitation Central nervous system:  Cranial nerves II through XII intact, tongue/uvula midline, moves all extremities spontaneously.  Cannot fully evaluate secondary to altered mental status.    .     Data Reviewed: Care during the described time interval was provided by me .  I have reviewed this patient's available data, including medical history, events of note, physical examination, and all test results as part of my evaluation.   CBC: Recent Labs  Lab 05/13/21 1920 05/13/21 1945 05/13/21 2105 05/14/21 1747  WBC 6.0  --   --  7.2  NEUTROABS 3.8  --   --  4.7  HGB 14.9 14.3 12.6* 13.9  HCT 47.3 42.0 37.0* 46.4  MCV 91.0  --   --  93.7  PLT 283  --   --  [id number]   Basic Metabolic Panel: Recent Labs  Lab 05/13/21 1920 05/13/21 1945 05/13/21 2105 05/14/21 1747  NA 135 137 138 138  K 3.7 3.7 3.3* 4.0  CL 100 102  --  101  CO2 21*  --   --  21*  GLUCOSE 87 85  --  74  BUN 8 6*  --  6*  CREATININE 0.75 0.50*  --  0.64  CALCIUM 9.1  --   --  9.1  MG  --   --   --  2.0  PHOS  --   --   --  2.8   GFR: Estimated Creatinine Clearance: 73 mL/min (by C-G formula based on SCr of 0.64 mg/dL). Liver Function Tests: Recent Labs  Lab 05/13/21 1920 05/14/21 1747  AST 22 26  ALT 9 <5  ALKPHOS 141* 128*  BILITOT 1.3* 0.8  PROT  5.9* <3.0*  ALBUMIN 2.8* 2.6*   No results for input(s): LIPASE, AMYLASE in the last 168 hours. No results for input(s): AMMONIA in the last 168 hours. Coagulation Profile: Recent Labs  Lab 05/13/21 1935  INR 1.1   Cardiac Enzymes: No results for input(s): CKTOTAL, CKMB, CKMBINDEX, TROPONINI in the last 168 hours. BNP (last 3 results) No results for input(s): PROBNP in the last 8760 hours. HbA1C: Recent Labs    05/14/21 0104  HGBA1C 4.8   CBG: Recent Labs  Lab 05/14/21 1630 05/14/21 2014 05/15/21 0005 05/15/21 0454 05/15/21 0705  GLUCAP 78 113* 123* 81 100*   Lipid Profile: No results for input(s): CHOL, HDL, LDLCALC, TRIG, CHOLHDL, LDLDIRECT in the last 72 hours. Thyroid Function Tests: Recent Labs    05/14/21 1747  TSH 1.312   Anemia Panel: No results for input(s): VITAMINB12, FOLATE, FERRITIN, TIBC, IRON, RETICCTPCT in the last 72 hours. Urine analysis:    Component Value Date/Time   COLORURINE YELLOW 05/13/2021 1853   APPEARANCEUR HAZY (A) 05/13/2021 1853   LABSPEC 1.010 05/13/2021 1853   PHURINE 6.0 05/13/2021 1853   GLUCOSEU NEGATIVE 05/13/2021 1853   HGBUR NEGATIVE 05/13/2021 1853   BILIRUBINUR NEGATIVE 05/13/2021 1853   KETONESUR 20 (A) 05/13/2021 1853   PROTEINUR NEGATIVE 05/13/2021 1853   UROBILINOGEN 0.2 01/06/2014 1257   NITRITE POSITIVE (A) 05/13/2021 1853   LEUKOCYTESUR LARGE (A) 05/13/2021 1853   Sepsis Labs: '@LABRCNTIP'$ (procalcitonin:4,lacticidven:4)  ) Recent Results (from the past 240 hour(s))  Blood Culture (routine x 2)     Status: None (Preliminary result)   Collection Time: 05/13/21  7:20 PM   Specimen: BLOOD  Result Value Ref Range Status   Specimen Description BLOOD SITE NOT SPECIFIED  Final   Special Requests   Final    BOTTLES DRAWN AEROBIC AND ANAEROBIC Blood Culture adequate volume   Culture   Final    NO GROWTH < 12 HOURS Performed at [facility], [place] [address], [place], [place] [address]    Report Status  PENDING  Incomplete  Resp Panel by RT-PCR (Flu A&B, Covid) Nasopharyngeal Swab     Status: None   Collection Time: 05/13/21  8:45 PM   Specimen: Nasopharyngeal Swab; Nasopharyngeal(NP) swabs in vial transport medium  Result Value Ref Range Status   SARS Coronavirus 2 by RT PCR NEGATIVE NEGATIVE Final    Comment: (NOTE) SARS-CoV-2 target nucleic acids are NOT DETECTED.  The SARS-CoV-2 RNA is generally detectable in upper respiratory specimens during the acute phase of infection. The lowest concentration of SARS-CoV-2 viral copies this assay can detect is 138 copies/mL. A negative result does not preclude SARS-Cov-2 infection and should not be used as the sole basis for treatment or other patient management decisions. A negative result may occur with  improper specimen collection/handling, submission of specimen other than nasopharyngeal swab, presence of viral mutation(s) within the areas targeted by this assay, and inadequate number of viral copies(<138 copies/mL). A negative result must be combined with clinical observations, patient history, and epidemiological information. The expected result is Negative.  Fact Sheet for Patients:  EntrepreneurPulse.com.au  Fact Sheet for Healthcare Providers:  IncredibleEmployment.be  This test is no t yet approved or cleared by the [place] FDA and  has been authorized for detection and/or diagnosis of SARS-CoV-2 by FDA under an Emergency Use Authorization (EUA). This EUA will remain  in effect (meaning this test can be used) for the duration of the COVID-19 declaration under Section 564(b)(1) of the Act, 21 U.S.C.section 360bbb-3(b)(1), unless the authorization is terminated  or revoked sooner.       Influenza A by PCR NEGATIVE NEGATIVE Final   Influenza B by PCR NEGATIVE NEGATIVE Final    Comment: (NOTE) The Xpert Xpress SARS-CoV-2/FLU/RSV plus assay is intended as an aid in the diagnosis of  influenza from Nasopharyngeal swab specimens and should not be used as a sole basis for treatment. Nasal washings and aspirates are unacceptable for Xpert Xpress  SARS-CoV-2/FLU/RSV testing.  Fact Sheet for Patients: EntrepreneurPulse.com.au  Fact Sheet for Healthcare Providers: IncredibleEmployment.be  This test is not yet approved or cleared by the [place] FDA and has been authorized for detection and/or diagnosis of SARS-CoV-2 by FDA under an Emergency Use Authorization (EUA). This EUA will remain in effect (meaning this test can be used) for the duration of the COVID-19 declaration under Section 564(b)(1) of the Act, 21 U.S.C. section 360bbb-3(b)(1), unless the authorization is terminated or revoked.  Performed at [facility], [place] [address], [place], [place] [address]   Blood Culture (routine x 2)     Status: None (Preliminary result)   Collection Time: 05/13/21 11:00 PM   Specimen: BLOOD  Result Value Ref Range Status   Specimen Description BLOOD SITE NOT SPECIFIED  Final   Special Requests   Final    BOTTLES DRAWN AEROBIC AND ANAEROBIC Blood Culture results may not be optimal due to an inadequate volume of blood received in culture bottles   Culture   Final    NO GROWTH < 12 HOURS Performed at [facility], [place] [address] [place], [place] [address]    Report Status PENDING  Incomplete         Radiology Studies: CT HEAD WO CONTRAST (5MM)  Result Date: 05/13/2021 CLINICAL DATA:  Altered mental status for 2 days EXAM: CT HEAD WITHOUT CONTRAST TECHNIQUE: Contiguous axial images were obtained from the base of the skull through the vertex without intravenous contrast. COMPARISON:  03/14/2021 FINDINGS: Brain: No evidence of acute infarction, hemorrhage, hydrocephalus, extra-axial collection or mass lesion/mass effect. Chronic white matter ischemic changes are noted. Vascular: No hyperdense vessel or unexpected  calcification. Skull: Normal. Negative for fracture or focal lesion. Sinuses/Orbits: No acute finding. Other: None. IMPRESSION: Chronic white matter ischemic change without acute intracranial abnormality. Electronically Signed   By: Inez Catalina M.D.   On: 05/13/2021 19:53   NM Pulmonary Perf and Vent  Result Date: 05/14/2021 CLINICAL DATA:  High probability pulmonary embolism, dyspnea, syncope. EXAM: NUCLEAR MEDICINE PERFUSION LUNG SCAN TECHNIQUE: Perfusion images were obtained in multiple projections after intravenous injection of radiopharmaceutical. Ventilation scans intentionally deferred if perfusion scan and chest x-ray adequate for interpretation during COVID 19 epidemic. RADIOPHARMACEUTICALS:  4.31 mCi Tc-23mMAA IV COMPARISON:  None. FINDINGS: There is a normal distribution of radiotracer within the lungs bilaterally. No perfusion defects are identified. IMPRESSION: Normal examination.  No evidence of pulmonary embolism. Electronically Signed   By: AFidela SalisburyMD   On: 05/14/2021 16:10   DG Chest Port 1 View  Result Date: 05/13/2021 CLINICAL DATA:  Questionable sepsis EXAM: PORTABLE CHEST 1 VIEW COMPARISON:  04/20/2021 FINDINGS: Heart and mediastinal contours are within normal limits. No focal opacities or effusions. No acute bony abnormality. IMPRESSION: No active disease. Electronically Signed   By: KRolm BaptiseM.D.   On: 05/13/2021 19:45        Scheduled Meds:  aspirin EC  81 mg Oral QHS   insulin aspart  0-9 Units Subcutaneous Q4H   pantoprazole  40 mg Oral Daily   simvastatin  40 mg Oral QHS   tamsulosin  0.4 mg Oral Daily   Continuous Infusions:  cefTRIAXone (ROCEPHIN)  IV Stopped (05/14/21 1855)     LOS: 2 days   The patient is critically ill with multiple organ systems failure and requires high complexity decision making for assessment and support, frequent evaluation and titration of therapies, application of advanced monitoring technologies and extensive  interpretation of multiple databases. Critical Care Time devoted to patient care services described in this note  Time spent: 40 minutes     Juandiego Kolenovic, Geraldo Docker, MD [facility]   If 7PM-7AM, please contact night-coverage 05/15/2021, 10:44 AM

## 2021-05-15 NOTE — TOC Initial Note (Signed)
Transition of Care [facility]) - Initial/Assessment Note    Patient Details  Name: Vincent [name] MRN: [id number] Date of Birth: 02/06/1951  Transition of Care [facility] CM/SW Contact:    Bethena Roys, RN Phone Number: 05/15/2021, 3:57 PM  Clinical Narrative:  Case Manager received secure chat that the patient will need hospice services in the home. Case Manager reached out to spouse Pierce Armagost, regarding choice. Family has chosen [employer]. Referral made to [place] and family in need of hospital bed, overbed table. Family declines the hoyer lift at this time. Per spouse, patient will need ambulance transport home once stable. Case Manager will continue to follow for additional transition of care needs.                   Expected Discharge Plan: Home w Hospice Care Barriers to Discharge: Continued Medical Work up   Patient Goals and CMS Choice Patient states their goals for this hospitalization and ongoing recovery are:: to return home with Hospice   Choice offered to / list presented to : NA (Spouse discussed choice with the palliative RN and asked for [facility].)  Expected Discharge Plan and Services Expected Discharge Plan: [place] In-house Referral: NA   Post Acute Care Choice: Hospice Living arrangements for the past 2 months: Single Family Home                 DME Arranged: Hospital bed, Overbed table DME Agency: Hospice and [place] Date DME Agency Contacted: 05/15/21 Time DME Agency Contacted: [phone] Representative spoke with at DME Agency: [place]: Hospice and [place] Date [place]: 05/15/21 Time HH Agency Contacted: 1500 Representative spoke with at [place]: [place]  Prior Living Arrangements/Services Living arrangements for the past 2 months: [place] with:: Spouse Patient language and need for interpreter reviewed:: Yes Do you  feel safe going back to the place where you live?: Yes      Need for Family Participation in Patient Care: Yes (Comment) Care giver support system in place?: Yes (comment) Current home services: DME Criminal Activity/Legal Involvement Pertinent to Current Situation/Hospitalization: No - Comment as needed  Activities of Daily Living      Permission Sought/Granted Permission sought to share information with : Family Supports, Customer service manager, Case Optician, dispensing granted to share information with : Yes, Verbal Permission Granted     Permission granted to share info w AGENCY: Cabin crew        Emotional Assessment Appearance:: Appears stated age Attitude/Demeanor/Rapport: Engaged Affect (typically observed): Appropriate Orientation: : Oriented to  Time, Oriented to Situation, Oriented to Self, Oriented to Place Alcohol / Substance Use: Not Applicable Psych Involvement: No (comment)  Admission diagnosis:  Encephalopathy [G93.40] Acute UTI [N39.0] Sepsis ([place]) [A41.9] Patient Active Problem List   Diagnosis Date Noted   Sepsis ([place]) 05/13/2021   Pressure injury of skin 04/21/2021   UTI (urinary tract infection) 03/14/2021   AKI (acute kidney injury) ([place]) 03/14/2021   Acute encephalopathy 03/14/2021   Hypokalemia 03/14/2021   Depression    Anxiety    Severe sepsis with septic shock ([place]) 02/06/2021   Chronic cholecystitis 02/06/2021   Sacral decubitus ulcer 02/06/2021   Severe sepsis ([place]) 02/06/2021   Vitamin B12 deficiency 01/27/2021   Diabetes 1.5, managed as type 2 ([place]) [id number]   Chronic systolic CHF (congestive heart failure) ([place]) 01/27/2021   CVA (cerebral vascular  accident) ([place]) 01/12/2021   Weakness    Precordial pain    Right knee pain 01/07/2018   History of total right knee replacement 01/07/2018   SOB (shortness of breath) 08/12/2016   Tachycardia 08/12/2016   Low grade fever 08/12/2016   Osteoarthritis of right knee  08/09/2016   OSA (obstructive sleep apnea) 11/29/2011   Syncope 07/24/2011   ARM NUMBNESS 06/19/2010   EDEMA 03/13/2010   FASTING HYPERGLYCEMIA 03/13/2010   CORONARY ATHEROSCLEROSIS NATIVE CORONARY ARTERY 06/20/2009   COLONIC POLYPS, ADENOMATOUS 01/31/2009   HLD (hyperlipidemia) 01/31/2009   Hypertension 01/31/2009   Coronary atherosclerosis 01/31/2009   HEMORRHOIDS 01/31/2009   DIVERTICULOSIS, MILD 01/31/2009   PCP:  Ginger Organ., MD Pharmacy:   [facility], [place] [address] Phone: [phone] Fax: [phone]  [id number] [address] [id number] [id number]: [phone]: [phone] Readmission Risk Interventions Readmission Risk Prevention Plan 05/15/2021  Transportation Screening Complete  PCP or Specialist Appt within 3-5 Days Complete  HRI or Home Care Consult Complete  Social Work Consult for [place]/Counseling Complete  Palliative Care Screening Complete  Medication Review (Press photographer Complete  Some recent data might be hidden

## 2021-05-15 NOTE — Evaluation (Signed)
Clinical/Bedside Swallow Evaluation Patient Details  Name: Vincent [name] MRN: [id number] Date of Birth: 11/13/1950  Today's Date: 05/15/2021 Time: SLP Start Time (ACUTE ONLY): [id number] SLP Stop Time (ACUTE ONLY): [id number] SLP Time Calculation (min) (ACUTE ONLY): 1424 min  Past Medical History:  Past Medical History:  Diagnosis Date   Acute MI ([place])    Allergy    Anxiety    Arthritis    Coronary artery disease    Depression    Diabetes mellitus without complication ([place])    Diverticulosis    mild   Hemorrhoids    HOH (hard of hearing)    Hx of colonic polyps    Hyperlipidemia    Hypertension    Left ventricular dysfunction    hx of   Past Surgical History:  Past Surgical History:  Procedure Laterality Date   ANGIOPLASTY     and bare metal stent placement   ANKLE SURGERY Right 1986   i&d abscess- pt states "put a bone in it"   CORONARY ANGIOPLASTY WITH STENT PLACEMENT     IR EXCHANGE BILIARY DRAIN  04/21/2021   IR PERC CHOLECYSTOSTOMY  02/07/2021   KNEE ARTHROPLASTY Right 08/09/2016   Procedure: RIGHT TOTAL KNEE ARTHROPLASTY WITH COMPUTER NAVIGATION;  Surgeon: Rod Can, MD;  Location: [facility];  Service: Orthopedics;  Laterality: Right;  Needs RNFA   LEFT HEART CATH AND CORONARY ANGIOGRAPHY N/A 11/21/2018   Procedure: LEFT HEART CATH AND CORONARY ANGIOGRAPHY;  Surgeon: Burnell Blanks, MD;  Location: [place] [facility];  Service: Cardiovascular;  Laterality: N/A;   HPI:  Pt is a 70 y.o. male who presented 05/13/21 with AMS and decreased PO intake and urine output. Pt with UTI and acute encephalopathy. CT negative for acute intracranial abnormalities. Prior BSE July 2022 without concerns for oropharyngeal dysphagia but recommending Dys 3 diet/thin liquids as he is edentulous. PMH:  hypertension, diabetes mellitus type 2, unspecified stroke in 01/2021 with residual speech difficulty and being bedbound, chronic combined heart failure, CAD, prior UTIs, depression, anxiety, chronic  cholecystitis needing cholecystostomy tube.   Assessment / Plan / Recommendation Clinical Impression  Pt has no dentition but still manipulates solids fairly well, needing a liquid wash to clear them from his oral cavity. An immediate cough x1 was noted when taking large, consecutive boluses as a liquid wash. Suspect that this could have been due to volume combined with mixed consistency and/or altered mentation. With assistance throughout intake to account for mentation and redirect pt, he had no further signs of potential aspiration. Recommend continuing with Dys 3 solids and thin liquids. SLP will f/u briefly but do not anticipate any additional f/u will be needed for swallowing after d/c. SLP Visit Diagnosis: Dysphagia, unspecified (R13.10)    Aspiration Risk  Mild aspiration risk    Diet Recommendation Dysphagia 3 (Mech soft);Thin liquid   Liquid Administration via: Cup;Straw Medication Administration: Whole meds with puree Supervision: Staff to assist with self feeding Compensations: Slow rate;Small sips/bites;Minimize environmental distractions Postural Changes: Seated upright at 90 degrees    Other  Recommendations Oral Care Recommendations: Oral care BID   Follow up Recommendations 24 hour supervision/assistance      Frequency and Duration min 1 x/week  1 week       Prognosis Prognosis for Safe Diet Advancement: Good Barriers to Reach Goals: Cognitive deficits      Swallow Study   General HPI: Pt is a 70 y.o. male who presented 05/13/21 with AMS and decreased PO intake and urine  output. Pt with UTI and acute encephalopathy. CT negative for acute intracranial abnormalities. Prior BSE July 2022 without concerns for oropharyngeal dysphagia but recommending Dys 3 diet/thin liquids as he is edentulous. PMH:  hypertension, diabetes mellitus type 2, unspecified stroke in 01/2021 with residual speech difficulty and being bedbound, chronic combined heart failure, CAD, prior UTIs,  depression, anxiety, chronic cholecystitis needing cholecystostomy tube. Type of Study: Bedside Swallow Evaluation Previous Swallow Assessment: see HPI Diet Prior to this Study: Dysphagia 3 (soft);Thin liquids Temperature Spikes Noted: No Respiratory Status: Room air History of Recent Intubation: No Behavior/Cognition: Alert;Requires cueing Oral Cavity Assessment: Within Functional Limits Oral Care Completed by SLP: No Oral Cavity - Dentition: Edentulous Vision: Functional for self-feeding Self-Feeding Abilities: Needs assist Patient Positioning: Upright in bed Baseline Vocal Quality: Normal Volitional Cough: Strong Volitional Swallow: Able to elicit    Oral/Motor/Sensory Function Overall Oral Motor/Sensory Function: Within functional limits   Ice Chips Ice chips: Not tested   Thin Liquid Thin Liquid: Impaired Presentation: Self Fed;Straw Pharyngeal  Phase Impairments: Cough - Immediate    Nectar Thick Nectar Thick Liquid: Not tested   Honey Thick Honey Thick Liquid: Not tested   Puree Puree: Within functional limits Presentation: Spoon   Solid     Solid: Within functional limits      Osie Bond., M.A. [facility] [facility] Pager [phone] Office [phone]  05/15/2021,3:58 PM

## 2021-05-15 NOTE — Progress Notes (Signed)
Initial Nutrition Assessment  DOCUMENTATION CODES:   Severe malnutrition in context of chronic illness  INTERVENTION:   -Ensure Enlive po TID, each supplement provides 350 kcal and 20 grams of protein  -Magic cup TID with meals, each supplement provides 290 kcal and 9 grams of protein  -MVI with minerals daily -Feeding assistance with meals  NUTRITION DIAGNOSIS:   Severe Malnutrition related to chronic illness (CVA) as evidenced by moderate fat depletion, severe fat depletion, moderate muscle depletion, severe muscle depletion, percent weight loss.  GOAL:   Patient will meet greater than or equal to 90% of their needs  MONITOR:   PO intake, Supplement acceptance, Diet advancement, Labs, Weight trends, Skin, I & O's  REASON FOR ASSESSMENT:   Consult Assessment of nutrition requirement/status  ASSESSMENT:   Vincent [name] is a 70 y.o. male with medical history significant of  hypertension, diabetes mellitus type 2, unspecified stroke in 01/2021 with residual speech difficulty and being bedbound, chronic combined heart failure, CAD, prior UTIs, depression, anxiety, chronic cholecystitis needing cholecystostomy tube  Pt admitted with sepsis secondary to UTI.   Reviewed I/O's: +453 ml x 24 hours  UOP: 250 ml x 24 hours  Pt very lethargic at time of visit. He did not respond to voice or touch until end of exam, when pt became restless when assessing lower extremities.   Pt on a dysphagia 3 diet with thin liquids. Noted meal completion 0-75%.   Reviewed wt hx; pt has experienced a 25.5% wt loss over the past 3 months, which is significant for time frame.   Palliative care following; plan to medically optimize pt with no escalation of scare if pt declines.   Lab Results  Component Value Date   HGBA1C 4.8 05/14/2021   PTA DM medications are 1000 mg metformin BID.   Labs reviewed: CBGS: 81-123 (inpatient orders for glycemic control are 0-9 units insulin aspart every 4  hours).    NUTRITION - FOCUSED PHYSICAL EXAM:  Flowsheet Row Most Recent Value  Orbital Region Severe depletion  Upper Arm Region Moderate depletion  Thoracic and Lumbar Region Moderate depletion  Buccal Region Severe depletion  Temple Region Severe depletion  Clavicle Bone Region Severe depletion  Clavicle and Acromion Bone Region Severe depletion  Scapular Bone Region Severe depletion  Dorsal Hand Moderate depletion  Patellar Region Moderate depletion  Anterior Thigh Region Moderate depletion  Posterior Calf Region Moderate depletion  Edema (RD Assessment) None  Hair Reviewed  Eyes Reviewed  Mouth Reviewed  Skin Reviewed  Nails Reviewed       Diet Order:   Diet Order             DIET DYS 3 Room service appropriate? Yes; Fluid consistency: Thin  Diet effective now                   EDUCATION NEEDS:   Not appropriate for education at this time  Skin:  Skin Integrity Issues:: Stage I Stage I: rt and lt sacrum Stage II: -  Last BM:  Unknown  Height:   Ht Readings from Last 1 Encounters:  05/14/21 '5\' 4"'$  (1.626 m)    Weight:   Wt Readings from Last 1 Encounters:  05/15/21 68 kg    Ideal Body Weight:  59.1 kg  BMI:  Body mass index is 25.73 kg/m.  Estimated Nutritional Needs:   Kcal:  2050-2250  Protein:  105-120 grams  Fluid:  > 2 L    Vitoria Conyer W, RD,  LDN, [place] Registered Dietitian II Certified Diabetes Care and Education Specialist Please refer to [facility] for RD and/or RD on-call/weekend/after hours pager

## 2021-05-15 NOTE — Progress Notes (Signed)
Hydrologist [facility]) Hospital Liaison: RN note    Notified by Transition of Care Manger of patient/family request for [facility] services at home after discharge. Chart and patient information under review by [facility] physician. Hospice eligibility pending currently.    Writer spoke with wife [name] to initiate education related to hospice philosophy, services and team approach to care. [name] verbalized understanding of information given. Did not discuss discharge plans via car or PTAR.   Please send signed and completed DNR form home with patient/family. Patient will need prescriptions for discharge comfort medications.     DME needs have been discussed, patient currently has the following equipment in the home: none.  Patient/family requests the following DME for delivery to the home: Hospital bed and OTB table. [place] equipment manager has been notified and will contact DME provider to arrange delivery to the home. Home address has been verified and is correct in the chart.  [name] is the family member to contact to arrange time of delivery.     [facility] Referral Center aware of the above. Please notify [facility] when patient is ready to leave the unit at discharge. (Call [phone] or [phone] after 5pm.) [facility] information and contact numbers given to [facility].      Please call with any hospice related questions.     Thank you for this referral.     Clementeen Hoof, BSN, [facility] (listed on [facility] under Hospice and [place] of [phone]  [phone]

## 2021-05-15 NOTE — Progress Notes (Signed)
OT Cancellation Note  Patient Details Name: Vincent [name] MRN: [id number] DOB: 1951-02-26   Cancelled Treatment:    Reason Eval/Treat Not Completed: Fatigue/lethargy limiting ability to participate Pt with agitation overnight and given Ativan. Pt currently resting in bed, too lethargic to participate in OT eval at this time. Will follow-up as schedule permits.  Layla Maw 05/15/2021, 7:03 AM

## 2021-05-15 NOTE — Evaluation (Signed)
Occupational Therapy Evaluation Patient Details Name: Vincent [name] MRN: [id number] DOB: 1951/07/05 Today's Date: 05/15/2021    History of Present Illness Pt is a 70 y.o. male who presented 05/13/21 with AMS and decreased PO intake and urine output. Pt with UTI and acute encephalopathy. CT negative for acute intracranial abnormalities. PMH:  hypertension, diabetes mellitus type 2, unspecified stroke in 01/2021 with residual speech difficulty and being bedbound, chronic combined heart failure, CAD, prior UTIs, depression, anxiety, chronic cholecystitis needing cholecystostomy tube   Clinical Impression   PTA, pt lives with spouse who assists with bathing, dressing and toileting ADLs. Per chart, pt has been bedbound since CVA in April. Pt presents now initially pleasant and able to assist with basic grooming ADLs bed level but became agitated by end of session, telling therapist to leave. Pt noted with weak B hand grasp and impaired composite flexion. OT will follow acutely to maximize independence with self feeding and basic grooming tasks, as well as family education for skin integrity and safe strategies for ADL completion.     Follow Up Recommendations  SNF;Supervision/Assistance - 24 hour (noted pt/wife declines SNF; would need max HH services including [place] aide)    Equipment Recommendations  Hospital bed;Other (comment) [place] lift)    Recommendations for Other Services       Precautions / Restrictions Precautions Precautions: Fall Restrictions Weight Bearing Restrictions: No      Mobility Bed Mobility               General bed mobility comments: declined    Transfers                 General transfer comment: pt declines and has not stood since April    Balance                                           ADL either performed or assessed with clinical judgement   ADL Overall ADL's : Needs assistance/impaired Eating/Feeding: NPO   Grooming:  Moderate assistance;Bed level;Wash/dry face Grooming Details (indicate cue type and reason): benefits from multimodal cues for completion, able to reach and wipe top of head. assist for thorough cleaning of facial hair                               General ADL Comments: Dependent for bathing, dressing and toileting at baseline per chart     Vision Patient Visual Report: Other (comment) (pt unable to state) Vision Assessment?: No apparent visual deficits     Perception     Praxis      Pertinent Vitals/Pain Pain Assessment: Faces Faces Pain Scale: No hurt Pain Intervention(s): Monitored during session     Hand Dominance Right   Extremity/Trunk Assessment Upper Extremity Assessment Upper Extremity Assessment: Generalized weakness;RUE deficits/detail;LUE deficits/detail;Difficult to assess due to impaired cognition RUE Deficits / Details: makes light fist, unable to squeeze therapist's hand, able to lift UE above head and reach to face functionally RUE Coordination: decreased fine motor LUE Deficits / Details: makes light fist, unable to squeeze therapist's hand, able to lift UE above head and reach to face functionally LUE Coordination: decreased fine motor   Lower Extremity Assessment Lower Extremity Assessment: Defer to PT evaluation   Cervical / Trunk Assessment Cervical / Trunk Assessment: Kyphotic   Communication Communication Communication:  Expressive difficulties (slurred speech)   Cognition Arousal/Alertness: Awake/alert Behavior During Therapy: Agitated;Restless Overall Cognitive Status: No family/caregiver present to determine baseline cognitive functioning Area of Impairment: Orientation;Attention;Memory;Following commands;Safety/judgement;Awareness;Problem solving                 Orientation Level: Disoriented to;Place;Time;Situation Current Attention Level: Sustained Memory: Decreased short-term memory;Decreased recall of  precautions Following Commands: Follows one step commands inconsistently;Follows one step commands with increased time Safety/Judgement: Decreased awareness of safety;Decreased awareness of deficits Awareness: Intellectual Problem Solving: Slow processing;Decreased initiation;Difficulty sequencing;Requires verbal cues;Requires tactile cues General Comments: pt unable to state where he was, nonsensical statements at times with inconsistent following of commands. initially pleasant but by end of session, telling therapist to "go on and get the hell out of here" as well as other aggresively charged statements   General Comments  Sitter entering room during session    Exercises     Shoulder Instructions      Home Living Family/patient expects to be discharged to:: Private residence Living Arrangements: Spouse/significant other Available Help at Discharge: Family;Available 24 hours/day Type of Home: House Home Access: Ramped entrance     Home Layout: One level     Bathroom Shower/Tub: Teacher, early years/pre: Standard     Home Equipment: Wheelchair - [employer] - 2 wheels;Cane - single point;Bedside commode   Additional Comments: info taken from PT eval      Prior Functioning/Environment Level of Independence: Needs assistance  Gait / Transfers Assistance Needed: wife reports pt has not stood since April. Wife has been unable to lift pt to sit EOB lately. ADL's / Homemaking Assistance Needed: pt is dependent for bathing and dressing            OT Problem List: Decreased strength;Decreased activity tolerance;Decreased cognition;Decreased coordination      OT Treatment/Interventions: Self-care/ADL training;Therapeutic exercise;DME and/or AE instruction;Therapeutic activities;Patient/family education;Balance training    OT Goals(Current goals can be found in the care plan section) Acute Rehab OT Goals Patient Stated Goal: get some food OT Goal Formulation:  Patient unable to participate in goal setting Time For Goal Achievement: 05/29/21 Potential to Achieve Goals: Fair  OT Frequency: Min 2X/week   Barriers to D/C:            Co-evaluation              AM-PAC OT "6 Clicks" Daily Activity     Outcome Measure Help from another person eating meals?: Total (NPO) Help from another person taking care of personal grooming?: A Lot Help from another person toileting, which includes using toliet, bedpan, or urinal?: Total Help from another person bathing (including washing, rinsing, drying)?: Total Help from another person to put on and taking off regular upper body clothing?: Total Help from another person to put on and taking off regular lower body clothing?: Total 6 Click Score: 7   End of Session Nurse Communication: Mobility status  Activity Tolerance: Treatment limited secondary to agitation Patient left: in bed;with call bell/phone within reach;with bed alarm set;with nursing/sitter in room;Other (comment) (with mitts applied)  OT Visit Diagnosis: Muscle weakness (generalized) (M62.81);Other symptoms and signs involving cognitive function                Time: 1107-1120 OT Time Calculation (min): 13 min Charges:  OT General Charges $OT Visit: 1 Visit OT Evaluation $OT Eval Moderate Complexity: 1 Mod  Vincent [name], OTR/L Acute Rehab Services Office: [phone]   Layla Maw 05/15/2021, 11:53 AM

## 2021-05-15 NOTE — Progress Notes (Signed)
Daily Progress Note   Patient Name: Vincent [name]       Date: 05/15/2021 DOB: 08/05/1951  Age: 70 y.o. MRN#: [id number] Attending Physician: Allie Bossier, MD Primary Care Physician: Ginger Organ., MD Admit Date: 05/13/2021  Reason for Consultation/Follow-up: goals of care  Subjective: Patient has remained intermittently agitated requiring bilateral soft wrist restraints. He is confused. He has a condom catheter that is working well. RN reports that prn ativan works well to decrease his anxiety/agitation.   I spoke with [facility] team and [place] hospice liaison regarding wife wants to have hospice services at home. Discussed that patient has been bed-bound since his stroke in April and wife has been caring for him at home. She would prefer [facility] since he may need GIP in the future and [facility] is close to her house. She will need a hospital bed and likely other DME.   Length of Stay: 2  Current Medications: Scheduled Meds:   aspirin EC  81 mg Oral QHS   insulin aspart  0-9 Units Subcutaneous Q4H   pantoprazole  40 mg Oral Daily   simvastatin  40 mg Oral QHS   tamsulosin  0.4 mg Oral Daily    Continuous Infusions:  cefTRIAXone (ROCEPHIN)  IV Stopped (05/14/21 1855)    PRN Meds: acetaminophen **OR** acetaminophen, haloperidol lactate, LORazepam   Vital Signs: BP (!) 141/96 (BP Location: Left Arm)   Pulse (!) 109   Temp 98 F (36.7 C) (Oral)   Resp 20   Ht '5\' 4"'$  (1.626 m)   Wt 68 kg   SpO2 100%   BMI 25.73 kg/m  SpO2: SpO2: 100 % O2 Device: O2 Device: Room Air O2 Flow Rate:    Intake/output summary:  Intake/Output Summary (Last 24 hours) at 05/15/2021 1350 Last data filed at 05/15/2021 1226 Gross per 24 hour  Intake 823.28 ml  Output 550 ml  Net 273.28 ml    LBM: Last BM Date:  (PTA) Baseline Weight: Weight: 68 kg Most recent weight: Weight: 68 kg       Palliative Assessment/Data: PPS 30%       Palliative Care Assessment & Plan   HPI/Patient Profile: 70 y.o. male  with past medical history of recent stroke (April 2022) with residual aphasia and weakness, chronic combined heart failure, hypertension,  CAD, diabetes type 2, prior UTIs, and chronic cholecystitis needing cholecystostomy tube. He presented to the emergency department on 05/13/2021 with worsening confusion, lethargy, and decreased oral intake. In the ED, head CT was unremarkable. Urinalysis showed evidence of UTI and he was started on Rocephin.   Patient has had 5 inpatient admissions in the last 6 months.   Assessment: - UTI - hx of CVA with residual deficits - non-ambulatory/bed-bound - chronic systolic CHF - chronic cholecystitis  Recommendations/Plan: Continue current supportive care  Ativan 0.5 mg every 6 hours PRN (increased from every 8 hours prn) Plan for discharge home with hospice when medically optimized PMT will continue to follow  Goals of Care and Additional Recommendations: Limitations on Scope of Treatment: no escalation of care, avoid rehospitalization   Code Status: DNR/DNI   Prognosis:  < 6 months  Discharge Planning: Home with Hospice    Thank you for allowing the Palliative Medicine Team to assist in the care of this patient.   Total Time 25 minutes Prolonged Time Billed  no       Greater than 50%  of this time was spent counseling and coordinating care related to the above assessment and plan.  Lavena Bullion, NP  Please contact Palliative Medicine Team phone at [phone] for questions and concerns.

## 2021-05-16 DIAGNOSIS — R451 Restlessness and agitation: Secondary | ICD-10-CM

## 2021-05-16 LAB — CBC WITH DIFFERENTIAL/PLATELET
Abs Immature Granulocytes: 0.03 10*3/uL (ref 0.00–0.07)
Basophils Absolute: 0 10*3/uL (ref 0.0–0.1)
Basophils Relative: 1 %
Eosinophils Absolute: 0 10*3/uL (ref 0.0–0.5)
Eosinophils Relative: 0 %
HCT: 39.3 % (ref 39.0–52.0)
Hemoglobin: 12.3 g/dL — ABNORMAL LOW (ref 13.0–17.0)
Immature Granulocytes: 1 %
Lymphocytes Relative: 21 %
Lymphs Abs: 1.4 10*3/uL (ref 0.7–4.0)
MCH: 28 pg (ref 26.0–34.0)
MCHC: 31.3 g/dL (ref 30.0–36.0)
MCV: 89.5 fL (ref 80.0–100.0)
Monocytes Absolute: 0.9 10*3/uL (ref 0.1–1.0)
Monocytes Relative: 15 %
Neutro Abs: 4 10*3/uL (ref 1.7–7.7)
Neutrophils Relative %: 62 %
Platelets: 255 10*3/uL (ref 150–400)
RBC: 4.39 MIL/uL (ref 4.22–5.81)
RDW: 15.9 % — ABNORMAL HIGH (ref 11.5–15.5)
WBC: 6.4 10*3/uL (ref 4.0–10.5)
nRBC: 0 % (ref 0.0–0.2)

## 2021-05-16 LAB — GLUCOSE, CAPILLARY
Glucose-Capillary: 101 mg/dL — ABNORMAL HIGH (ref 70–99)
Glucose-Capillary: 113 mg/dL — ABNORMAL HIGH (ref 70–99)
Glucose-Capillary: 115 mg/dL — ABNORMAL HIGH (ref 70–99)
Glucose-Capillary: 90 mg/dL (ref 70–99)
Glucose-Capillary: 93 mg/dL (ref 70–99)
Glucose-Capillary: 98 mg/dL (ref 70–99)

## 2021-05-16 LAB — COMPREHENSIVE METABOLIC PANEL
ALT: 11 U/L (ref 0–44)
AST: 21 U/L (ref 15–41)
Albumin: 2.4 g/dL — ABNORMAL LOW (ref 3.5–5.0)
Alkaline Phosphatase: 118 U/L (ref 38–126)
Anion gap: 10 (ref 5–15)
BUN: <5 mg/dL — ABNORMAL LOW (ref 8–23)
CO2: 26 mmol/L (ref 22–32)
Calcium: 8.9 mg/dL (ref 8.9–10.3)
Chloride: 101 mmol/L (ref 98–111)
Creatinine, Ser: 0.55 mg/dL — ABNORMAL LOW (ref 0.61–1.24)
GFR, Estimated: >60 mL/min (ref >60)
Glucose, Bld: 115 mg/dL — ABNORMAL HIGH (ref 70–99)
Potassium: 3.2 mmol/L — ABNORMAL LOW (ref 3.5–5.1)
Sodium: 137 mmol/L (ref 135–145)
Total Bilirubin: 0.8 mg/dL (ref 0.3–1.2)
Total Protein: 5.2 g/dL — ABNORMAL LOW (ref 6.5–8.1)

## 2021-05-16 LAB — URINE CULTURE: Culture: NO GROWTH

## 2021-05-16 LAB — PHOSPHORUS: Phosphorus: 2.2 mg/dL — ABNORMAL LOW (ref 2.5–4.6)

## 2021-05-16 LAB — MAGNESIUM: Magnesium: 1.7 mg/dL (ref 1.7–2.4)

## 2021-05-16 MED ORDER — METOPROLOL TARTRATE 12.5 MG HALF TABLET
12.5000 mg | ORAL_TABLET | Freq: Two times a day (BID) | ORAL | Status: DC
  Administered 2021-05-16: 12.5 mg via ORAL
  Filled 2021-05-16 (×2): qty 1

## 2021-05-16 MED ORDER — DEXTROSE-NACL 5-0.9 % IV SOLN: INTRAVENOUS | Status: DC

## 2021-05-16 MED ORDER — OLANZAPINE 5 MG PO TABS
2.5000 mg | ORAL_TABLET | Freq: Every day | ORAL | Status: DC
  Administered 2021-05-16: 2.5 mg via ORAL
  Filled 2021-05-16: qty 1

## 2021-05-16 MED ORDER — OXYCODONE HCL 5 MG PO TABS: 5.0000 mg | ORAL_TABLET | Freq: Four times a day (QID) | ORAL | Status: DC | PRN

## 2021-05-16 MED ORDER — POTASSIUM CHLORIDE 10 MEQ/100ML IV SOLN
10.0000 meq | INTRAVENOUS | Status: AC
  Administered 2021-05-16 (×6): 10 meq via INTRAVENOUS
  Filled 2021-05-16 (×6): qty 100

## 2021-05-16 MED ORDER — MAGNESIUM SULFATE 2 GM/50ML IV SOLN
2.0000 g | Freq: Once | INTRAVENOUS | Status: AC
  Administered 2021-05-16: 2 g via INTRAVENOUS
  Filled 2021-05-16: qty 50

## 2021-05-16 MED ORDER — METOPROLOL TARTRATE 5 MG/5ML IV SOLN
5.0000 mg | Freq: Once | INTRAVENOUS | Status: AC
  Administered 2021-05-16: 5 mg via INTRAVENOUS
  Filled 2021-05-16: qty 5

## 2021-05-16 MED ORDER — POTASSIUM CHLORIDE 10 MEQ/100ML IV SOLN: 10.0000 meq | INTRAVENOUS | Status: DC

## 2021-05-16 MED ORDER — MEGESTROL ACETATE 400 MG/10ML PO SUSP
400.0000 mg | Freq: Two times a day (BID) | ORAL | Status: DC
  Administered 2021-05-16: 400 mg via ORAL
  Filled 2021-05-16 (×4): qty 10

## 2021-05-16 MED ORDER — LORAZEPAM 1 MG PO TABS
1.0000 mg | ORAL_TABLET | Freq: Four times a day (QID) | ORAL | Status: DC | PRN
  Administered 2021-05-17 (×2): 1 mg via ORAL
  Filled 2021-05-16 (×2): qty 1

## 2021-05-16 MED ORDER — MAGNESIUM SULFATE 2 GM/50ML IV SOLN: 2.0000 g | Freq: Once | INTRAVENOUS | Status: DC

## 2021-05-16 NOTE — Progress Notes (Signed)
Manufacturing engineer [facility]) Hospital Liaison Note:  Update on this Home Hospice Referral:  DME [facility] bed and Over the bedside table) have been ordered for this patient by [facility]. Per request of patient family, the delivery is scheduled for tomorrow to give the family time to make room for the DME.  TOC made aware of above.  Please call with any hospice related questions or needs,  Thank you,  Gar Ponto, RN [facility] HLT [phone]

## 2021-05-16 NOTE — Plan of Care (Signed)

## 2021-05-16 NOTE — Progress Notes (Signed)
PROGRESS NOTE    Vincent [name]  [id number] DOB: 1950-12-02 DOA: 05/13/2021 PCP: Ginger Organ., MD   Brief Narrative:  Vincent [name] is a 70 y.o. WM PMHx   hypertension, diabetes mellitus type 2, unspecified stroke in 01/2021 with residual speech difficulty and being bedbound, chronic combined heart failure, CAD, prior UTIs, depression, anxiety, chronic cholecystitis needing cholecystostomy tube  Presented with   worsening confusion as per family.  Has been more lethargic.  Has not been eating and drinking as much as before.  Seems to be similar to prior presentation when he has a UTI.   During his last admission his cholecystectomy tube was replaced he was treated with Rocephin for UTI and was able to be discharged to home on 16 July. SNF was recommended but wife declined Patient has known history of recurrent UTIs previous 1 grew E. coli sensitive to Rocephin.     Subjective: 8/9 afebrile overnight, sinus tachycardia.  Patient unresponsive could not take PO medication, does not follow commands.  Family called this morning and stated that [facility] palliative care would not be able to deliver equipment to house until 8/10.   Assessment & Plan:  Covid vaccination;  Active Problems:   HLD (hyperlipidemia)   Hypertension   Diabetes 1.5, managed as type 2 (HCC)   Chronic systolic CHF (congestive heart failure) (HCC)   Chronic cholecystitis   Severe sepsis (HCC)   UTI (urinary tract infection)   Acute encephalopathy   Protein-calorie malnutrition, severe  UTI - 5-day course Rocephin - 8/8 cultures negative.  However patient had already been on 48 hours of antibiotics prior to urine culture, will therefore complete course of antibiotics.  Essential HTN - 8/9 Metoprolol 12.5 mg BID  Chronic systolic CHF - Strict in and out -259.41m - Daily weight Filed Weights   05/15/21 0411 05/16/21 0017 05/16/21 0500  Weight: 68 kg 70.7 kg 70.6 kg   -See essential  HTN  HLD - Simvastatin 40 mg daily  DM type II controlled without complication. - 8/7 hemoglobin A1c= 4.8 - Sensitive SSI    Chronic cholecystitis - S/p biliary drain  Acute Encephalopathy with Agitation? -Unsure of patient's baseline. - Treat all above issues - 8/8 Haldol 2 mg TID PRN agitation  EtOH abuse? - Spoke at length with RN Young and it was reported to her that patient was an alcoholic.  This diagnosis not in patient's admission note. -Also not within patient's problem list.  Will speak with family regarding patient's alcohol use.  Severe protein calorie malnutrition - Supplementation per nutrition -8/9 Megace 400 mg BID -8/9 D5-0.9% saline 536mhr  Hypokalemia - Potassium goal> 4 -8/9 potassium IV 60 mEq   Hypomagnesmia -Magnesium goal> 2 -8/9 magnesium IV 2 g    Goals of care - Awaiting palliative care recommendations given that patient has had 5 hospitalizations in the last 6 months. -8/9 Family called this morning and stated that [facility] able [facility] equipment to house until 8/10.   DVT prophylaxis: SCD Code Status: DNR Family Communication:  Status is: Inpatient    Dispo: The patient is from: Home              Anticipated d/c is to: Home with hospice 8/10              Anticipated d/c date is: > 3 days              Patient currently is not medically  stable to d/c.      Consultants:  Palliative care  Procedures/Significant Events:     I have personally reviewed and interpreted all radiology studies and my findings are as above.  VENTILATOR SETTINGS:    Cultures 8/6 SARS coronavirus negative 8/6 blood NGTD x2. 8/8 urine culture negative   Antimicrobials: Anti-infectives (From admission, onward)    Start     Ordered Stop   05/13/21 1900  cefTRIAXone (ROCEPHIN) 1 g in sodium chloride 0.9 % 100 mL IVPB        05/13/21 1854           Devices    LINES / TUBES:      Continuous  Infusions:  cefTRIAXone (ROCEPHIN)  IV Stopped (05/15/21 1843)     Objective: Vitals:   05/16/21 0300 05/16/21 0400 05/16/21 0500 05/16/21 0716  BP: 129/70 (!) 156/105 139/77 (!) 132/95  Pulse: (!) 120 (!) 127 (!) 115 (!) 125  Resp: 19 20 (!) 22 19  Temp: 98 F (36.7 C)  97.9 F (36.6 C) 97.9 F (36.6 C)  TempSrc: Oral  Oral Oral  SpO2: 98% 95% 98% 96%  Weight:   70.6 kg   Height:        Intake/Output Summary (Last 24 hours) at 05/16/2021 1224 Last data filed at 05/16/2021 [id number] Gross per 24 hour  Intake 817 ml  Output 850 ml  Net -33 ml    Filed Weights   05/15/21 0411 05/16/21 0017 05/16/21 0500  Weight: 68 kg 70.7 kg 70.6 kg    Physical Exam:  General: Somnolent, does not follow commands No acute respiratory distress Eyes: negative scleral hemorrhage, negative anisocoria, negative icterus ENT: Negative Runny nose, negative gingival bleeding, Neck:  Negative scars, masses, torticollis, lymphadenopathy, JVD Lungs: Clear to auscultation bilaterally without wheezes or crackles Cardiovascular: Sinus tachycardia without murmur gallop or rub normal S1 and S2 Abdomen: negative abdominal pain, nondistended, positive soft, bowel sounds, no rebound, no ascites, no appreciable mass Extremities: No significant cyanosis, clubbing, or edema bilateral lower extremities Skin: Negative rashes, lesions, ulcers Psychiatric: Unable to evaluate secondary to altered mental status Central nervous system: Able to evaluate secondary to altered mental status, will spontaneously move extremities.  Does not follow commands    .     Data Reviewed: Care during the described time interval was provided by me .  I have reviewed this patient's available data, including medical history, events of note, physical examination, and all test results as part of my evaluation.   CBC: Recent Labs  Lab 05/13/21 1920 05/13/21 1945 05/13/21 2105 05/14/21 1747 05/15/21 1201  WBC 6.0  --   --  7.2 5.2   NEUTROABS 3.8  --   --  4.7 3.0  HGB 14.9 14.3 12.6* 13.9 12.4*  HCT 47.3 42.0 37.0* 46.4 39.5  MCV 91.0  --   --  93.7 91.0  PLT 283  --   --  252 [id number]    Basic Metabolic Panel: Recent Labs  Lab 05/13/21 1920 05/13/21 1945 05/13/21 2105 05/14/21 1747 05/15/21 1201  NA 135 137 138 138 139  K 3.7 3.7 3.3* 4.0 3.3*  CL 100 102  --  101 105  CO2 21*  --   --  21* 27  GLUCOSE 87 85  --  74 124*  BUN 8 6*  --  6* 5*  CREATININE 0.75 0.50*  --  0.64 0.65  CALCIUM 9.1  --   --  9.1 8.8*  MG  --   --   --  2.0 1.8  PHOS  --   --   --  2.8 2.5    GFR: Estimated Creatinine Clearance: 73 mL/min (by C-G formula based on SCr of 0.65 mg/dL). Liver Function Tests: Recent Labs  Lab 05/13/21 1920 05/14/21 1747 05/15/21 1201  AST '22 26 17  '$ ALT 9 <5 11  ALKPHOS 141* 128* 112  BILITOT 1.3* 0.8 0.7  PROT 5.9* <3.0* 4.8*  ALBUMIN 2.8* 2.6* 2.2*    No results for input(s): LIPASE, AMYLASE in the last 168 hours. No results for input(s): AMMONIA in the last 168 hours. Coagulation Profile: Recent Labs  Lab 05/13/21 1935  INR 1.1    Cardiac Enzymes: No results for input(s): CKTOTAL, CKMB, CKMBINDEX, TROPONINI in the last 168 hours. BNP (last 3 results) No results for input(s): PROBNP in the last 8760 hours. HbA1C: Recent Labs    05/14/21 0104  HGBA1C 4.8    CBG: Recent Labs  Lab 05/15/21 2004 05/16/21 0008 05/16/21 0352 05/16/21 0714 05/16/21 1104  GLUCAP 154* 90 115* 98 113*    Lipid Profile: No results for input(s): CHOL, HDL, LDLCALC, TRIG, CHOLHDL, LDLDIRECT in the last 72 hours. Thyroid Function Tests: Recent Labs    05/14/21 1747  TSH 1.312    Anemia Panel: No results for input(s): VITAMINB12, FOLATE, FERRITIN, TIBC, IRON, RETICCTPCT in the last 72 hours. Urine analysis:    Component Value Date/Time   COLORURINE YELLOW 05/13/2021 1853   APPEARANCEUR HAZY (A) 05/13/2021 1853   LABSPEC 1.010 05/13/2021 1853   PHURINE 6.0 05/13/2021 1853   GLUCOSEU  NEGATIVE 05/13/2021 1853   HGBUR NEGATIVE 05/13/2021 1853   BILIRUBINUR NEGATIVE 05/13/2021 1853   KETONESUR 20 (A) 05/13/2021 1853   PROTEINUR NEGATIVE 05/13/2021 1853   UROBILINOGEN 0.2 01/06/2014 1257   NITRITE POSITIVE (A) 05/13/2021 1853   LEUKOCYTESUR LARGE (A) 05/13/2021 1853   Sepsis Labs: '@LABRCNTIP'$ (procalcitonin:4,lacticidven:4)  ) Recent Results (from the past 240 hour(s))  Blood Culture (routine x 2)     Status: None (Preliminary result)   Collection Time: 05/13/21  7:20 PM   Specimen: BLOOD  Result Value Ref Range Status   Specimen Description BLOOD SITE NOT SPECIFIED  Final   Special Requests   Final    BOTTLES DRAWN AEROBIC AND ANAEROBIC Blood Culture adequate volume   Culture   Final    NO GROWTH 3 DAYS Performed at [facility], [place] [address] [place], [place] [address]    Report Status PENDING  Incomplete  Resp Panel by RT-PCR (Flu A&B, Covid) Nasopharyngeal Swab     Status: None   Collection Time: 05/13/21  8:45 PM   Specimen: Nasopharyngeal Swab; Nasopharyngeal(NP) swabs in vial transport medium  Result Value Ref Range Status   SARS Coronavirus 2 by RT PCR NEGATIVE NEGATIVE Final    Comment: (NOTE) SARS-CoV-2 target nucleic acids are NOT DETECTED.  The SARS-CoV-2 RNA is generally detectable in upper respiratory specimens during the acute phase of infection. The lowest concentration of SARS-CoV-2 viral copies this assay can detect is 138 copies/mL. A negative result does not preclude SARS-Cov-2 infection and should not be used as the sole basis for treatment or other patient management decisions. A negative result may occur with  improper specimen collection/handling, submission of specimen other than nasopharyngeal swab, presence of viral mutation(s) within the areas targeted by this assay, and inadequate number of viral copies(<138 copies/mL). A negative result must be combined with clinical observations, patient history, and  epidemiological information. The expected result is Negative.  Fact Sheet for Patients:  EntrepreneurPulse.com.au  Fact Sheet for Healthcare Providers:  IncredibleEmployment.be  This test is no t yet approved or cleared by the [place] FDA and  has been authorized for detection and/or diagnosis of SARS-CoV-2 by FDA under an Emergency Use Authorization (EUA). This EUA will remain  in effect (meaning this test can be used) for the duration of the COVID-19 declaration under Section 564(b)(1) of the Act, 21 U.S.C.section 360bbb-3(b)(1), unless the authorization is terminated  or revoked sooner.       Influenza A by PCR NEGATIVE NEGATIVE Final   Influenza B by PCR NEGATIVE NEGATIVE Final    Comment: (NOTE) The Xpert Xpress SARS-CoV-2/FLU/RSV plus assay is intended as an aid in the diagnosis of influenza from Nasopharyngeal swab specimens and should not be used as a sole basis for treatment. Nasal washings and aspirates are unacceptable for Xpert Xpress SARS-CoV-2/FLU/RSV testing.  Fact Sheet for Patients: EntrepreneurPulse.com.au  Fact Sheet for Healthcare Providers: IncredibleEmployment.be  This test is not yet approved or cleared by the [place] FDA and has been authorized for detection and/or diagnosis of SARS-CoV-2 by FDA under an Emergency Use Authorization (EUA). This EUA will remain in effect (meaning this test can be used) for the duration of the COVID-19 declaration under Section 564(b)(1) of the Act, 21 U.S.C. section 360bbb-3(b)(1), unless the authorization is terminated or revoked.  Performed at [facility], [place] [address], [place], [place] [address]   Blood Culture (routine x 2)     Status: None (Preliminary result)   Collection Time: 05/13/21 11:00 PM   Specimen: BLOOD  Result Value Ref Range Status   Specimen Description BLOOD SITE NOT SPECIFIED  Final   Special Requests    Final    BOTTLES DRAWN AEROBIC AND ANAEROBIC Blood Culture results may not be optimal due to an inadequate volume of blood received in culture bottles   Culture   Final    NO GROWTH 3 DAYS Performed at [facility], [place] [address] [place], [place] [address]    Report Status PENDING  Incomplete  Urine Culture     Status: None   Collection Time: 05/15/21  2:08 PM   Specimen: Urine, Clean Catch  Result Value Ref Range Status   Specimen Description URINE, CLEAN CATCH  Final   Special Requests NONE  Final   Culture   Final    NO GROWTH Performed at [facility], [place] [address], [place], [place] [address]    Report Status 05/16/2021 FINAL  Final         Radiology Studies: NM Pulmonary Perf and Vent  Result Date: 05/14/2021 CLINICAL DATA:  High probability pulmonary embolism, dyspnea, syncope. EXAM: NUCLEAR MEDICINE PERFUSION LUNG SCAN TECHNIQUE: Perfusion images were obtained in multiple projections after intravenous injection of radiopharmaceutical. Ventilation scans intentionally deferred if perfusion scan and chest x-ray adequate for interpretation during COVID 19 epidemic. RADIOPHARMACEUTICALS:  4.31 mCi Tc-31mMAA IV COMPARISON:  None. FINDINGS: There is a normal distribution of radiotracer within the lungs bilaterally. No perfusion defects are identified. IMPRESSION: Normal examination.  No evidence of pulmonary embolism. Electronically Signed   By: AFidela SalisburyMD   On: 05/14/2021 16:10        Scheduled Meds:  aspirin EC  81 mg Oral QHS   feeding supplement  237 mL Oral TID BM   insulin aspart  0-9 Units Subcutaneous Q4H   mouth rinse  15 mL Mouth Rinse BID  multivitamin with minerals  1 tablet Oral Daily   pantoprazole  40 mg Oral Daily   simvastatin  40 mg Oral QHS   tamsulosin  0.4 mg Oral Daily   Continuous Infusions:  cefTRIAXone (ROCEPHIN)  IV Stopped (05/15/21 1843)     LOS: 3 days   The patient is critically ill with multiple organ systems  failure and requires high complexity decision making for assessment and support, frequent evaluation and titration of therapies, application of advanced monitoring technologies and extensive interpretation of multiple databases. Critical Care Time devoted to patient care services described in this note  Time spent: 40 minutes     Rickita Forstner, Geraldo Docker, MD [facility]   If 7PM-7AM, please contact night-coverage 05/16/2021, 12:24 PM

## 2021-05-16 NOTE — Care Management (Signed)
05-16-21 Case Manager received a call from [facility]. DME has been ordered for this patient and per family request, they wanted the DME delivered on 05-17-21. Case Manager will continue to follow for additional transition of care needs.

## 2021-05-16 NOTE — Progress Notes (Signed)
Daily Progress Note   Patient Name: Vincent [name]       Date: 05/16/2021 DOB: [birth year]  Age: 70 y.o. MRN#: [id number] Attending Physician: Allie Bossier, MD Primary Care Physician: Ginger Organ., MD Admit Date: 05/13/2021  Reason for Consultation/Follow-up: goals of care, symptom management  Subjective: Spoke with wife [name] by phone regarding symptom management. She reports patient is agitated and anxious at home. She gives him ativan 3 times daily and reports it does not help. She thinks it wears off too soon. Discuss increasing the dose and the frequency. Also discussed starting a daily medication at bedtime (Zyprexa) to help with sleep and with his behavioral disturbance. [name] also reports patient seems in be in pain "all the time". She reports he needs a knee and a hip replacement, but is not a surgical candidate at this point. Discussed starting prn pain medication.   Plan to meet [name] at bedside tomorrow to complete a MOST form.    Length of Stay: 3  Vital Signs: BP (!) 134/98 (BP Location: Left Arm)   Pulse 95   Temp 98.1 F (36.7 C) (Oral)   Resp 15   Ht '5\' 4"'$  (1.626 m)   Wt 70.6 kg   SpO2 97%   BMI 26.72 kg/m  SpO2: SpO2: 97 % O2 Device: O2 Device: Room Air O2 Flow Rate:    Intake/output summary:  Intake/Output Summary (Last 24 hours) at 05/16/2021 1851 Last data filed at 05/16/2021 1700 Gross per 24 hour  Intake 320 ml  Output 350 ml  Net -30 ml   LBM: Last BM Date: 05/15/21 Baseline Weight: Weight: 68 kg Most recent weight: Weight: 70.6 kg       Palliative Assessment/Data: PPS 30%      Palliative Care Assessment & Plan   HPI/Patient Profile: 70 y.o. male  with past medical history of recent stroke (April 2022) with residual aphasia  and weakness, chronic combined heart failure, hypertension, CAD, diabetes type 2, prior UTIs, and chronic cholecystitis needing cholecystostomy tube. He presented to the emergency department on 05/13/2021 with worsening confusion, lethargy, and decreased oral intake. In the ED, head CT was unremarkable. Urinalysis showed evidence of UTI and he was started on Rocephin.   Patient has had 5 inpatient admissions in the last 6 months.  Assessment: - UTI - hx of CVA with residual deficits - non-ambulatory/bed-bound - chronic systolic CHF - chronic cholecystitis  Recommendations/Plan: Plan for home with hospice  Please continue at discharge: Oxycodone IR 5 mg every 6 hours prn pain  Lorazepam (Ativan) 1 mg every 6 hours prn anxiety/agitation Olanzapine (Zyprexa) 2.5 mg at bedtime   Goals of Care and Additional Recommendations: Limitations on Scope of Treatment: Avoid Hospitalization and No Artificial Feeding  Code Status: DNR/DNI  Prognosis:  < 6 months  Discharge Planning: Home with Hospice   Thank you for allowing the Palliative Medicine Team to assist in the care of this patient.   Total Time 25 minutes Prolonged Time Billed  no       Greater than 50%  of this time was spent counseling and coordinating care related to the above assessment and plan.  Lavena Bullion, NP  Please contact Palliative Medicine Team phone at [phone] for questions and concerns.

## 2021-05-17 LAB — GLUCOSE, CAPILLARY
Glucose-Capillary: 169 mg/dL — ABNORMAL HIGH (ref 70–99)
Glucose-Capillary: 101 mg/dL — ABNORMAL HIGH (ref 70–99)
Glucose-Capillary: 118 mg/dL — ABNORMAL HIGH (ref 70–99)
Glucose-Capillary: 122 mg/dL — ABNORMAL HIGH (ref 70–99)
Glucose-Capillary: 117 mg/dL — ABNORMAL HIGH (ref 70–99)

## 2021-05-17 MED ORDER — OLANZAPINE 2.5 MG PO TABS: 2.5000 mg | ORAL_TABLET | Freq: Every day | ORAL | 0 refills | Status: AC

## 2021-05-17 MED ORDER — ENSURE ENLIVE PO LIQD: 237.0000 mL | Freq: Three times a day (TID) | ORAL | 0 refills | Status: AC

## 2021-05-17 MED ORDER — LORAZEPAM 1 MG PO TABS: 1.0000 mg | ORAL_TABLET | Freq: Four times a day (QID) | ORAL | 0 refills | Status: AC | PRN

## 2021-05-17 MED ORDER — OXYCODONE HCL 5 MG PO TABS: 5.0000 mg | ORAL_TABLET | Freq: Four times a day (QID) | ORAL | 0 refills | Status: AC | PRN

## 2021-05-17 MED ORDER — METOPROLOL TARTRATE 25 MG PO TABS: 12.5000 mg | ORAL_TABLET | Freq: Two times a day (BID) | ORAL | 0 refills | Status: AC

## 2021-05-17 NOTE — TOC Transition Note (Signed)
Transition of Care [facility]) - CM/SW Discharge Note   Patient Details  Name: Vincent [name] MRN: [id number] Date of Birth: 18-Jul-1951  Transition of Care [facility]) CM/SW Contact:  Zenon Mayo, RN Phone Number: 05/17/2021, 12:15 PM   Clinical Narrative:    NCM notified wife and Staff RN and [place]  that DME is at the home and this NCM scheduled PTAR for transport , address confirmed by wife.    Final next level of care: Home w Hospice Care Barriers to Discharge: No Barriers Identified   Patient Goals and CMS Choice Patient states their goals for this hospitalization and ongoing recovery are:: home with hospice   Choice offered to / list presented to : NA (Spouse discussed choice with the palliative RN and asked for [facility]  Discharge Placement                       Discharge Plan and Services In-house Referral: NA   Post Acute Care Choice: Hospice          DME Arranged: Hospital bed, Overbed table DME Agency: Hospice and [place] Date DME Agency Contacted: 05/15/21 Time DME Agency Contacted: [phone] Representative spoke with at DME Agency: [place]: Hospice and [place] Date [place]: 05/15/21 Time HH Agency Contacted: 1500 Representative spoke with at [place]: [place] ([place]) Interventions     Readmission Risk Interventions Readmission Risk Prevention Plan 05/15/2021  Transportation Screening Complete  PCP or Specialist Appt within 3-5 Days Complete  HRI or [place] Complete  Social Work Consult for [place] Planning/Counseling Complete  Palliative Care Screening Complete  Medication Review Press photographer) Complete  Some recent data might be hidden

## 2021-05-17 NOTE — Progress Notes (Signed)
Pt able to follow command, alert x1. Mittens were off. Reminded not to pull lines, pt more compliant.  Bath given and pt seems comfortable.

## 2021-05-17 NOTE — Progress Notes (Signed)
PT Cancellation Note  Patient Details Name: Vincent [name] MRN: [id number] DOB: [birth year]   Cancelled Treatment:    Reason Eval/Treat Not Completed: Patient declined, no reason specified. PT attempted to see patient for family training however no family present on this date up unto this point. PT attempted to initiate bed mobility however pt is refusing at this time. PT will follow up as time allows.   Zenaida Niece 05/17/2021, 3:47 PM

## 2021-05-17 NOTE — Progress Notes (Signed)
                                                                                                                                                                                                         Daily Progress Note   Patient Name: Vincent [name]       Date: 05/17/2021 DOB: 03/14/1951  Age: 70 y.o. MRN#: [id number] Attending Physician: Arrien, Mauricio Daniel,* Primary Care Physician: Shaw, William D Jr., MD Admit Date: 05/13/2021  Reason for Consultation/Follow-up: goals of care, symptom management  Subjective:  Advanced directives, concepts specific to code status, artifical feeding and hydration, and rehospitalization were considered and discussed.  Katherine and I completed a MOST form today. The following treatment decisions were outlined:  Cardiopulmonary Resuscitation: Do Not Attempt Resuscitation (DNR/No CPR)  Medical Interventions: Comfort Measures: Keep clean, warm, and dry. Use medication by any route, positioning, wound care, and other measures to relieve pain and suffering. Use oxygen, suction and manual treatment of airway obstruction as needed for comfort. Do not transfer to the hospital unless comfort needs cannot be met in current location.  Antibiotics: Antibiotics if indicated  IV Fluids: No IV fluids (provide other measures to ensure comfort)  Feeding Tube: No feeding tube      Length of Stay: 4  Current Medications: Scheduled Meds:   aspirin EC  81 mg Oral QHS   feeding supplement  237 mL Oral TID BM   insulin aspart  0-9 Units Subcutaneous Q4H   mouth rinse  15 mL Mouth Rinse BID   megestrol  400 mg Oral BID   metoprolol tartrate  12.5 mg Oral BID   multivitamin with minerals  1 tablet Oral Daily   OLANZapine  2.5 mg Oral QHS   pantoprazole  40 mg Oral Daily   simvastatin  40 mg Oral QHS   tamsulosin  0.4 mg Oral Daily    Continuous Infusions:  cefTRIAXone (ROCEPHIN)  IV 1 g (05/16/21 1940)   dextrose 5 % and 0.9% NaCl 50 mL/hr at 05/17/21 0809     PRN Meds: acetaminophen **OR** acetaminophen, haloperidol lactate, LORazepam, oxyCODONE         Vital Signs: BP 102/66 (BP Location: Left Arm)   Pulse 93   Temp 97.9 F (36.6 C) (Oral)   Resp 18   Ht 5' 4" (1.626 m)   Wt 69.6 kg   SpO2 98%   BMI 26.34 kg/m  SpO2: SpO2: 98 % O2 Device: O2 Device: Room Air O2 Flow Rate:    Intake/output summary:  Intake/Output Summary (Last   24 hours) at 05/17/2021 1105 Last data filed at 05/17/2021 1047 Gross per 24 hour  Intake 685.54 ml  Output 1600 ml  Net -914.46 ml   LBM: Last BM Date: 05/15/21 Baseline Weight: Weight: 68 kg Most recent weight: Weight: 69.6 kg       Palliative Assessment/Data: PPS 20-30%        Palliative Care Assessment & Plan   HPI/Patient Profile: 70 y.o. male  with past medical history of recent stroke (April 2022) with residual aphasia and weakness, chronic combined heart failure, hypertension, CAD, diabetes type 2, prior UTIs, and chronic cholecystitis needing cholecystostomy tube. He presented to the emergency department on 05/13/2021 with worsening confusion, lethargy, and decreased oral intake. In the ED, head CT was unremarkable. Urinalysis showed evidence of UTI and he was started on Rocephin.   Patient has had 5 inpatient admissions in the last 6 months.    Assessment: - UTI - hx of CVA with residual deficits - non-ambulatory/bed-bound - chronic systolic CHF - chronic cholecystitis   Recommendations/Plan: Plan for home with hospice Electronic MOST form completed Please continue at discharge: Oxycodone IR 5 mg every 6 hours prn pain Lorazepam (Ativan) 1 mg every 6 hours prn anxiety/agitation Olanzapine (Zyprexa) 2.5 mg at bedtime    Goals of Care and Additional Recommendations: Limitations on Scope of Treatment: Full Comfort Care and No Artificial Feeding  Code Status:DNR/DNI   Prognosis:  < 6 months  Discharge Planning: Home with Hospice   Thank you for allowing the Palliative  Medicine Team to assist in the care of this patient.   Total Time 25 minutes Prolonged Time Billed  no       Greater than 50%  of this time was spent counseling and coordinating care related to the above assessment and plan.  Lavena Bullion, NP  Please contact Palliative Medicine Team phone at [phone] for questions and concerns.

## 2021-05-17 NOTE — Progress Notes (Addendum)
[facility] liaison note  11:39am:  Confirmed DME delivery.  TOC made aware.  Spoke with pt's wife who reports DME is pending delivery between 1130a-12 noon. This liaison will confirm delivery. TOC aware.  Pt's wife verbalizes understanding that pt has a discharge summary in and will discharge once DME is delivered.  Thank you for the opportunity to participate in this pt's care.  Domenic Moras, BSN, RN [facility] Liaison [phone] (24h on call)

## 2021-05-17 NOTE — Progress Notes (Signed)
Patient off unit for D/C with transport by [facility]. Patient w/o belongings and does not claim any to send with him. PIV, cardiac tele removed.

## 2021-05-17 NOTE — TOC Progression Note (Signed)
Transition of Care [facility]) - Progression Note    Patient Details  Name: Vincent [name] MRN: [id number] Date of Birth: Mar 15, 1951  Transition of [facility]) CM/SW Contact  Zenon Mayo, RN Phone Number: 05/17/2021, 9:41 AM  Clinical Narrative:    NCM spoke with wife this am to see if DME has been delivered, she states the DME has not been delivered yet and she does not know what time they will deliver it.  NCM contacted Chrislyn with [facility], she is checking on the eta which she be today.     Expected Discharge Plan: Home w Hospice Care Barriers to Discharge: Continued Medical Work up  Expected Discharge Plan and Services Expected Discharge Plan: [place] In-house Referral: NA   Post Acute Care Choice: Hospice Living arrangements for the past 2 months: Single Family Home Expected Discharge Date: 05/17/21               DME Arranged: Hospital bed, Overbed table DME Agency: [facility] and [place] Date DME Agency Contacted: 05/15/21 Time DME Agency Contacted: [phone] Representative spoke with at DME Agency: [place]: [facility] [place] Date [place]: 05/15/21 Time HH Agency Contacted: 1500 Representative spoke with at [place]: [place] ([place]) Interventions    Readmission Risk Interventions Readmission Risk Prevention Plan 05/15/2021  Transportation Screening Complete  PCP or Specialist Appt within 3-5 Days Complete  HRI or [place] Complete  Social Work Consult for [place] Planning/Counseling Complete  Palliative Care Screening Complete  Medication Review [employer]) Complete  Some recent data might be hidden

## 2021-05-17 NOTE — Discharge Summary (Addendum)
Physician Discharge Summary  Vincent [name] [id number] DOB: 09-16-1951 DOA: 05/13/2021  PCP: Ginger Organ., MD  Admit date: 05/13/2021 Discharge date: 05/17/2021  Admitted From: Home  Disposition:  Home with hospice   Recommendations for Outpatient Follow-up and new medication changes:  Follow up with Dr. Brigitte Pulse in 7 to 10 days.  Patient placed on olanzapine at night Increased frequency of lorazepam to as needed every 6 hrs Added oxycodone 5 mg IR as needed every 6 hrs.  Discontinue quetiapine   I spoke over the phone with the patient's wife about patient's  condition, plan of care, prognosis and all questions were addressed.   Home Health: home hospice   Equipment/Devices: na    Discharge Condition: stable  CODE STATUS: DNR   Diet recommendation:  regular.   Brief/Interim Summary: [name] was admitted to the hospital with the working diagnosis recurrent urinary tract infection complicated with metabolic encephalopathy.  Poor prognosis, 5th hospitalization within the last 6 months.  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  Chronic heart failure, coronary artery disease, recurrent UTIs, chronic cholecystitis status post cholecystostomy tube, anxiety and depression.   Her family brought him back to the hospital due to lethargy, no p.o. intake.  His wife declined skilled nursing facility on prior hospitalization.  On his initial physical examination his blood pressure was 127/79, heart rate 107, respiratory rate 18, temperature 98.1, oxygen saturation 95%, his lungs were clear to auscultation, heart S1-S2, present, rhythmic, soft abdomen, no lower extremity edema.   Sodium 136, potassium 3.7, chloride 100, bicarb 21, glucose 87, BUN 8, creatinine 0.75, white count 6.0, hemoglobin 14.9, hematocrit 47.3, platelets 283. SARS COVID-19 negative.  Urinalysis specific gravity 1.010, > 50  white cells. Head CT with chronic white matter ischemic changes without acute intracranial abnormality.  Chest radiograph no infiltrates.  EKG 108 bpm, left axis deviation, left anterior fascicular block, normal intervals, sinus rhythm, Q-wave V1-V2, no significant ST segment or T wave changes.  Was placed on ceftriaxone for urinary tract infection.  Patient remained unresponsive, no p.o. intake or following commands.  Palliative care was consulted.  Patient has been transitioned to comfort care, will be discharged with hospice services.  Patient required haloperidol for agitation.  He was placed on scheduled dose of olanzapine at night with good toleration.   Recurrent urinary tract infection, complicated with metabolic encephalopathy (sepsis ruled out).  Patient was placed on supportive medical therapy including intravenous fluids and intravenous antibiotic therapy. Received ceftriaxone. His discharge white cell count is 6.4.  Urine cultures and blood cultures remain no growth.  Patient had persistent encephalopathy, declining overall medications and p.o. intake. He has been transitioned to comfort care.  He required antipsychotic therapy with haloperidol. He has been placed on olanzapine and as needed lorazepam.  2.  Hypertension, chronic systolic heart failure.  Patient was placed on metoprolol with good toleration.  He remained euvolemic, no signs of exacerbation.  3.  Type 2 diabetes mellitus, dyslipidemia.  Patient was placed on insulin sliding scale for glucose coverage and monitoring. Continue simvastatin 40 mg daily. At home to resume metformin.   4.  Chronic cholecystitis.  Patient has cholecystostomy drain in place.  5.  History of CVA, swallow dysfunction, severe calorie protein malnutrition.  Bedbound. Patient on statin and aspirin therapy, poor prognosis, poor p.o. intake. Place nutritional supplements.  6.  Hypokalemia/hypomagnesemia.  Electrolytes were  corrected.  Discharge Diagnoses:  Active Problems:   HLD (hyperlipidemia)   Hypertension   Diabetes 1.5, managed as type 2 (HCC)   Chronic systolic CHF (congestive heart failure) (HCC)   Chronic cholecystitis   Severe sepsis (HCC)   UTI (urinary tract infection)   Acute encephalopathy   Protein-calorie malnutrition, severe    Discharge Instructions   Allergies as of 05/17/2021       Reactions   Penicillins Other (See Comments)   Disputed in 2022- Has tolerated cephalexin in the past  Has patient had a PCN reaction causing immediate rash, facial/tongue/throat swelling, SOB or lightheadedness with hypotension: Unknown Has patient had a PCN reaction causing severe rash involving mucus membranes or skin necrosis: Unknown Has patient had a PCN reaction that required hospitalization: Unknown Has patient had a PCN reaction occurring within the last 10 years: No        Medication List     STOP taking these medications    QUEtiapine 25 MG tablet Commonly known as: SEROQUEL       TAKE these medications    acetaminophen 500 MG tablet Commonly known as: TYLENOL Take 1,000 mg by mouth every 6 (six) hours as needed for moderate pain or headache.   aspirin EC 81 MG tablet Take 1 tablet (81 mg total) by mouth at bedtime.   feeding supplement Liqd Take 237 mLs by mouth 3 (three) times daily between meals.   LORazepam 1 MG tablet Commonly known as: ATIVAN Take 1 tablet (1 mg total) by mouth every 6 (six) hours as needed for anxiety. What changed: when to take this   Mag-Oxide 200 MG Tabs Generic drug: Magnesium Oxide Take 1 tablet (200 mg total) by mouth daily at 12 noon.   metFORMIN 1000 MG tablet Commonly known as: GLUCOPHAGE Take 1,000 mg by mouth 2 (two) times daily.   metoprolol tartrate 25 MG tablet Commonly known as: LOPRESSOR Take 0.5 tablets (12.5 mg total) by mouth 2 (two) times daily for 30 doses.   OLANZapine 2.5 MG tablet Commonly known as:  ZYPREXA Take 1 tablet (2.5 mg total) by mouth at bedtime.   oxyCODONE 5 MG immediate release tablet Commonly known as: Oxy IR/ROXICODONE Take 1 tablet (5 mg total) by mouth every 6 (six) hours as needed for moderate pain or severe pain (or dyspnea).   pantoprazole 40 MG tablet Commonly known as: PROTONIX Take 1 tablet (40 mg total) by mouth daily.   simvastatin 40 MG tablet Commonly known as: ZOCOR Take 1 tablet (40 mg total) by mouth at bedtime.   tamsulosin 0.4 MG Caps capsule Commonly known as: FLOMAX Take 1 capsule (0.4 mg total) by mouth daily.   traZODone 50 MG tablet Commonly known as: DESYREL Take 50 mg by mouth at bedtime.   vitamin B-12 1000 MCG tablet Commonly known as: CYANOCOBALAMIN Take 1 tablet (1,000 mcg total) by mouth daily.        Follow-up Information     [facility] Follow up.   Specialty: PALLIATIVE CARE Why: Hospice Services-office to call with a visit time. Contact information: [place]        Ginger Organ., MD Follow up.   Specialty: Internal Medicine Contact information: [place] [address] [phone]                Allergies  Allergen Reactions   Penicillins Other (See Comments)    Disputed in 2022- Has tolerated cephalexin in the past  Has patient  had a PCN reaction causing immediate rash, facial/tongue/throat swelling, SOB or lightheadedness with hypotension: Unknown Has patient had a PCN reaction causing severe rash involving mucus membranes or skin necrosis: Unknown Has patient had a PCN reaction that required hospitalization: Unknown Has patient had a PCN reaction occurring within the last 10 years: No      Consultations: Palliative Care    Procedures/Studies: CT HEAD WO CONTRAST (5MM)  Result Date: 05/13/2021 CLINICAL DATA:  Altered mental status for 2 days EXAM: CT HEAD WITHOUT CONTRAST TECHNIQUE: Contiguous axial  images were obtained from the base of the skull through the vertex without intravenous contrast. COMPARISON:  03/14/2021 FINDINGS: Brain: No evidence of acute infarction, hemorrhage, hydrocephalus, extra-axial collection or mass lesion/mass effect. Chronic white matter ischemic changes are noted. Vascular: No hyperdense vessel or unexpected calcification. Skull: Normal. Negative for fracture or focal lesion. Sinuses/Orbits: No acute finding. Other: None. IMPRESSION: Chronic white matter ischemic change without acute intracranial abnormality. Electronically Signed   By: Inez Catalina M.D.   On: 05/13/2021 19:53   US RENAL  Result Date: 04/20/2021 CLINICAL DATA:  UTI. EXAM: RENAL / URINARY TRACT ULTRASOUND COMPLETE COMPARISON:  Abdomen CT 01/22/2021 FINDINGS: Right Kidney: Renal measurements: 11.6 x 5.3 x 4.6 cm = volume: 148 mL. Exophytic interpolar cyst noted as seen on prior CT. No hydronephrosis. Normal parenchymal echogenicity. Left Kidney: Renal measurements: 12.5 x 5.5 x 5.5 cm = volume: 195 mL. Echogenicity within normal limits. No mass or hydronephrosis visualized. Bladder: Appears normal for degree of bladder distention. Other: None. IMPRESSION: Unremarkable.  No hydronephrosis. Electronically Signed   By: Misty Stanley M.D.   On: 04/20/2021 19:50   NM Pulmonary Perf and Vent  Result Date: 05/14/2021 CLINICAL DATA:  High probability pulmonary embolism, dyspnea, syncope. EXAM: NUCLEAR MEDICINE PERFUSION LUNG SCAN TECHNIQUE: Perfusion images were obtained in multiple projections after intravenous injection of radiopharmaceutical. Ventilation scans intentionally deferred if perfusion scan and chest x-ray adequate for interpretation during COVID 19 epidemic. RADIOPHARMACEUTICALS:  4.31 mCi Tc-78mMAA IV COMPARISON:  None. FINDINGS: There is a normal distribution of radiotracer within the lungs bilaterally. No perfusion defects are identified. IMPRESSION: Normal examination.  No evidence of pulmonary  embolism. Electronically Signed   By: AFidela SalisburyMD   On: 05/14/2021 16:10   DG Chest Port 1 View  Result Date: 05/13/2021 CLINICAL DATA:  Questionable sepsis EXAM: PORTABLE CHEST 1 VIEW COMPARISON:  04/20/2021 FINDINGS: Heart and mediastinal contours are within normal limits. No focal opacities or effusions. No acute bony abnormality. IMPRESSION: No active disease. Electronically Signed   By: KRolm BaptiseM.D.   On: 05/13/2021 19:45   DG Chest Port 1 View  Result Date: 04/20/2021 CLINICAL DATA:  Altered mental status EXAM: PORTABLE CHEST 1 VIEW COMPARISON:  03/14/2021 FINDINGS: The heart size and mediastinal contours are within normal limits. Both lungs are clear. No pleural effusion. The visualized skeletal structures are unremarkable. IMPRESSION: No acute process in the chest. Electronically Signed   By: PMacy MisM.D.   On: 04/20/2021 11:49   IR EXCHANGE BILIARY DRAIN  Result Date: 04/21/2021 INDICATION: 70year old male with a history of acute acalculous cholecystitis (sludge only visualized on ultrasound and MRCP). Patient was a poor operative candidate and therefore treated with percutaneous cholecystostomy tube on 02/07/2021. Subsequently he was unable to attend scheduled follow-up appointment due to lack of accessed transportation. He now presents to the hospital with buildup of debris around the tube entry site and presents interventional radiology for tube exchange and  evaluation. EXAM: Cholecystostomy tube exchange MEDICATIONS: None ANESTHESIA/SEDATION: None FLUOROSCOPY TIME:  Fluoroscopy Time: 0 minutes 42 seconds (16 mGy). COMPLICATIONS: None immediate. PROCEDURE: Informed written consent was obtained from the patient after a thorough discussion of the procedural risks, benefits and alternatives. All questions were addressed. Maximal Sterile Barrier Technique was utilized including caps, mask, sterile gowns, sterile gloves, sterile drape, hand hygiene and skin antiseptic. A timeout  was performed prior to the initiation of the procedure. Contrast was injected through the existing cholecystostomy tube. The cystic duct is patent and the common bile duct are patent. No evidence of choledocholithiasis. The tube entry site is encrusted with scab and dried bile. Local anesthesia was attained by infiltration with 1% lidocaine. The encrustations were carefully removed. The retention suture was cut. The tube was transected and removed over a Bentson wire. A new 10 French tube was advanced over the wire and formed in the gallbladder lumen. Contrast was again injected. The biliary tree remains widely patent. The existing tube was gently flushed and then capped before being secured to the skin with adhesive fixation. IMPRESSION: 1. Restored patency of the cystic duct and common bile ducts. 2. Successful exchange for a new 10 French percutaneous cholecystostomy tube. PLAN: 1. Given restoration of patency of the cystic duct in the setting of a calculus cholecystitis, patient is now a candidate for physiologic capped trial. 2. Drainage catheter was capped. Patient will return Interventional Radiology in 1 week. If he remains asymptomatic, the tube can be removed at that time. Electronically Signed   By: Jacqulynn Cadet M.D.   On: 04/21/2021 17:15       Subjective: Patient continue to decline po intake and po medications, confused and disorientated. No frank agitation   Discharge Exam: Limited due to patient's poor cooperation  Vitals:   05/17/21 0600 05/17/21 0707  BP: 116/69 119/64  Pulse:  87  Resp: 20 (!) 23  Temp: 98 F (36.7 C) 98.1 F (36.7 C)  SpO2:  97%   Vitals:   05/16/21 1934 05/17/21 0100 05/17/21 0600 05/17/21 0707  BP: (!) 136/93 114/70 116/69 119/64  Pulse: (!) 102 91  87  Resp: '19 20 20 '$ (!) 23  Temp: 98 F (36.7 C) (!) 97.4 F (36.3 C) 98 F (36.7 C) 98.1 F (36.7 C)  TempSrc: Oral Axillary Oral Oral  SpO2: 99% 96%  97%  Weight:  69.6 kg    Height:         General: Not in pain or dyspnea, deconditioned  Neurology: Awake and alert, non focal  E ENT: mild pallor, no icterus, oral mucosa moist Cardiovascular: No JVD. S1-S2 present, rhythmic, no gallops, rubs, or murmurs. No lower extremity edema. Pulmonary: positive breath sounds bilaterally, with no wheezing,  Gastrointestinal. Abdomen soft and non tender Skin. No rashes Musculoskeletal: no joint deformities   The results of significant diagnostics from this hospitalization (including imaging, microbiology, ancillary and laboratory) are listed below for reference.     Microbiology: Recent Results (from the past 240 hour(s))  Blood Culture (routine x 2)     Status: None (Preliminary result)   Collection Time: 05/13/21  7:20 PM   Specimen: BLOOD  Result Value Ref Range Status   Specimen Description BLOOD SITE NOT SPECIFIED  Final   Special Requests   Final    BOTTLES DRAWN AEROBIC AND ANAEROBIC Blood Culture adequate volume   Culture   Final    NO GROWTH 3 DAYS Performed at [facility], [address], [place], [place] [address]    Report Status PENDING  Incomplete  Resp Panel by RT-PCR (Flu A&B, Covid) Nasopharyngeal Swab     Status: None   Collection Time: 05/13/21  8:45 PM   Specimen: Nasopharyngeal Swab; Nasopharyngeal(NP) swabs in vial transport medium  Result Value Ref Range Status   SARS Coronavirus 2 by RT PCR NEGATIVE NEGATIVE Final    Comment: (NOTE) SARS-CoV-2 target nucleic acids are NOT DETECTED.  The SARS-CoV-2 RNA is generally detectable in upper respiratory specimens during the acute phase of infection. The lowest concentration of SARS-CoV-2 viral copies this assay can detect is 138 copies/mL. A negative result does not preclude SARS-Cov-2 infection and should not be used as the sole basis for treatment or other patient management decisions. A negative result may occur with  improper specimen collection/handling, submission of specimen other than  nasopharyngeal swab, presence of viral mutation(s) within the areas targeted by this assay, and inadequate number of viral copies(<138 copies/mL). A negative result must be combined with clinical observations, patient history, and epidemiological information. The expected result is Negative.  Fact Sheet for Patients:  EntrepreneurPulse.com.au  Fact Sheet for Healthcare Providers:  IncredibleEmployment.be  This test is no t yet approved or cleared by the [place] FDA and  has been authorized for detection and/or diagnosis of SARS-CoV-2 by FDA under an Emergency Use Authorization (EUA). This EUA will remain  in effect (meaning this test can be used) for the duration of the COVID-19 declaration under Section 564(b)(1) of the Act, 21 U.S.C.section 360bbb-3(b)(1), unless the authorization is terminated  or revoked sooner.       Influenza A by PCR NEGATIVE NEGATIVE Final   Influenza B by PCR NEGATIVE NEGATIVE Final    Comment: (NOTE) The Xpert Xpress SARS-CoV-2/FLU/RSV plus assay is intended as an aid in the diagnosis of influenza from Nasopharyngeal swab specimens and should not be used as a sole basis for treatment. Nasal washings and aspirates are unacceptable for Xpert Xpress SARS-CoV-2/FLU/RSV testing.  Fact Sheet for Patients: EntrepreneurPulse.com.au  Fact Sheet for Healthcare Providers: IncredibleEmployment.be  This test is not yet approved or cleared by the [place] FDA and has been authorized for detection and/or diagnosis of SARS-CoV-2 by FDA under an Emergency Use Authorization (EUA). This EUA will remain in effect (meaning this test can be used) for the duration of the COVID-19 declaration under Section 564(b)(1) of the Act, 21 U.S.C. section 360bbb-3(b)(1), unless the authorization is terminated or revoked.  Performed at [facility], [place] [address] [place], [place] [address]    Blood Culture (routine x 2)     Status: None (Preliminary result)   Collection Time: 05/13/21 11:00 PM   Specimen: BLOOD  Result Value Ref Range Status   Specimen Description BLOOD SITE NOT SPECIFIED  Final   Special Requests   Final    BOTTLES DRAWN AEROBIC AND ANAEROBIC Blood Culture results may not be optimal due to an inadequate volume of blood received in culture bottles   Culture   Final    NO GROWTH 3 DAYS Performed at [facility], [place] [address] [place],  [address]    Report Status PENDING  Incomplete  Urine Culture     Status: None   Collection Time: 05/15/21  2:08 PM   Specimen: Urine, Clean Catch  Result Value Ref Range Status   Specimen Description URINE, CLEAN CATCH  Final   Special Requests NONE  Final   Culture   Final  NO GROWTH Performed at [facility], [place] [address] [place], [place] [address]    Report Status 05/16/2021 FINAL  Final     Labs: BNP (last 3 results) Recent Labs    01/24/21 0353 01/25/21 0514 01/26/21 0238  BNP 242.2* 43.6 0000000   Basic Metabolic Panel: Recent Labs  Lab 05/13/21 1920 05/13/21 1945 05/13/21 2105 05/14/21 1747 05/15/21 1201 05/16/21 1329  NA 135 137 138 138 139 137  K 3.7 3.7 3.3* 4.0 3.3* 3.2*  CL 100 102  --  101 105 101  CO2 21*  --   --  21* 27 26  GLUCOSE 87 85  --  74 124* 115*  BUN 8 6*  --  6* 5* <5*  CREATININE 0.75 0.50*  --  0.64 0.65 0.55*  CALCIUM 9.1  --   --  9.1 8.8* 8.9  MG  --   --   --  2.0 1.8 1.7  PHOS  --   --   --  2.8 2.5 2.2*   Liver Function Tests: Recent Labs  Lab 05/13/21 1920 05/14/21 1747 05/15/21 1201 05/16/21 1329  AST '22 26 17 21  '$ ALT 9 '5 11 11  '$ ALKPHOS 141* 128* 112 118  BILITOT 1.3* 0.8 0.7 0.8  PROT 5.9* <3.0* 4.8* 5.2*  ALBUMIN 2.8* 2.6* 2.2* 2.4*   No results for input(s): LIPASE, AMYLASE in the last 168 hours. No results for input(s): AMMONIA in the last 168 hours. CBC: Recent Labs  Lab 05/13/21 1920 05/13/21 1945 05/13/21 2105  05/14/21 1747 05/15/21 1201 05/16/21 1329  WBC 6.0  --   --  7.2 5.2 6.4  NEUTROABS 3.8  --   --  4.7 3.0 4.0  HGB 14.9 14.3 12.6* 13.9 12.4* 12.3*  HCT 47.3 42.0 37.0* 46.4 39.5 39.3  MCV 91.0  --   --  93.7 91.0 89.5  PLT 283  --   --  252 238 255   Cardiac Enzymes: No results for input(s): CKTOTAL, CKMB, CKMBINDEX, TROPONINI in the last 168 hours. BNP: Invalid input(s): POCBNP CBG: Recent Labs  Lab 05/16/21 1618 05/16/21 2008 05/17/21 0107 05/17/21 0349 05/17/21 0708  GLUCAP 101* 93 169* 101* 118*   D-Dimer No results for input(s): DDIMER in the last 72 hours. Hgb A1c No results for input(s): HGBA1C in the last 72 hours. Lipid Profile No results for input(s): CHOL, HDL, LDLCALC, TRIG, CHOLHDL, LDLDIRECT in the last 72 hours. Thyroid function studies Recent Labs    05/14/21 1747  TSH 1.312   Anemia work up No results for input(s): VITAMINB12, FOLATE, FERRITIN, TIBC, IRON, RETICCTPCT in the last 72 hours. Urinalysis    Component Value Date/Time   COLORURINE YELLOW 05/13/2021 1853   APPEARANCEUR HAZY (A) 05/13/2021 1853   LABSPEC 1.010 05/13/2021 1853   PHURINE 6.0 05/13/2021 1853   GLUCOSEU NEGATIVE 05/13/2021 1853   HGBUR NEGATIVE 05/13/2021 1853   BILIRUBINUR NEGATIVE 05/13/2021 1853   KETONESUR 20 (A) 05/13/2021 1853   PROTEINUR NEGATIVE 05/13/2021 1853   UROBILINOGEN 0.2 01/06/2014 1257   NITRITE POSITIVE (A) 05/13/2021 1853   LEUKOCYTESUR LARGE (A) 05/13/2021 1853   Sepsis Labs Invalid input(s): PROCALCITONIN,  WBC,  LACTICIDVEN Microbiology Recent Results (from the past 240 hour(s))  Blood Culture (routine x 2)     Status: None (Preliminary result)   Collection Time: 05/13/21  7:20 PM   Specimen: BLOOD  Result Value Ref Range Status   Specimen Description BLOOD SITE NOT SPECIFIED  Final  Special Requests   Final    BOTTLES DRAWN AEROBIC AND ANAEROBIC Blood Culture adequate volume   Culture   Final    NO GROWTH 3 DAYS Performed at [facility], [place] [address] [place], [place] [address]    Report Status PENDING  Incomplete  Resp Panel by RT-PCR (Flu A&B, Covid) Nasopharyngeal Swab     Status: None   Collection Time: 05/13/21  8:45 PM   Specimen: Nasopharyngeal Swab; Nasopharyngeal(NP) swabs in vial transport medium  Result Value Ref Range Status   SARS Coronavirus 2 by RT PCR NEGATIVE NEGATIVE Final    Comment: (NOTE) SARS-CoV-2 target nucleic acids are NOT DETECTED.  The SARS-CoV-2 RNA is generally detectable in upper respiratory specimens during the acute phase of infection. The lowest concentration of SARS-CoV-2 viral copies this assay can detect is 138 copies/mL. A negative result does not preclude SARS-Cov-2 infection and should not be used as the sole basis for treatment or other patient management decisions. A negative result may occur with  improper specimen collection/handling, submission of specimen other than nasopharyngeal swab, presence of viral mutation(s) within the areas targeted by this assay, and inadequate number of viral copies(<138 copies/mL). A negative result must be combined with clinical observations, patient history, and epidemiological information. The expected result is Negative.  Fact Sheet for Patients:  EntrepreneurPulse.com.au  Fact Sheet for Healthcare Providers:  IncredibleEmployment.be  This test is no t yet approved or cleared by the [place] FDA and  has been authorized for detection and/or diagnosis of SARS-CoV-2 by FDA under an Emergency Use Authorization (EUA). This EUA will remain  in effect (meaning this test can be used) for the duration of the COVID-19 declaration under Section 564(b)(1) of the Act, 21 U.S.C.section 360bbb-3(b)(1), unless the authorization is terminated  or revoked sooner.       Influenza A by PCR NEGATIVE NEGATIVE Final   Influenza B by PCR NEGATIVE NEGATIVE Final    Comment: (NOTE) The Xpert Xpress  SARS-CoV-2/FLU/RSV plus assay is intended as an aid in the diagnosis of influenza from Nasopharyngeal swab specimens and should not be used as a sole basis for treatment. Nasal washings and aspirates are unacceptable for Xpert Xpress SARS-CoV-2/FLU/RSV testing.  Fact Sheet for Patients: EntrepreneurPulse.com.au  Fact Sheet for Healthcare Providers: IncredibleEmployment.be  This test is not yet approved or cleared by the [place] FDA and has been authorized for detection and/or diagnosis of SARS-CoV-2 by FDA under an Emergency Use Authorization (EUA). This EUA will remain in effect (meaning this test can be used) for the duration of the COVID-19 declaration under Section 564(b)(1) of the Act, 21 U.S.C. section 360bbb-3(b)(1), unless the authorization is terminated or revoked.  Performed at [facility], [place] [address], [place], [place] [address]   Blood Culture (routine x 2)     Status: None (Preliminary result)   Collection Time: 05/13/21 11:00 PM   Specimen: BLOOD  Result Value Ref Range Status   Specimen Description BLOOD SITE NOT SPECIFIED  Final   Special Requests   Final    BOTTLES DRAWN AEROBIC AND ANAEROBIC Blood Culture results may not be optimal due to an inadequate volume of blood received in culture bottles   Culture   Final    NO GROWTH 3 DAYS Performed at [facility], [place] [address], [place], [place] [address]    Report Status PENDING  Incomplete  Urine Culture     Status: None   Collection Time: 05/15/21  2:08 PM   Specimen: Urine, Clean Catch  Result Value Ref Range Status   Specimen Description URINE, CLEAN CATCH  Final   Special Requests NONE  Final   Culture   Final    NO GROWTH Performed at [facility], [address] [place], [place] [address]    Report Status 05/16/2021 FINAL  Final     Time coordinating discharge: 45 minutes  SIGNED:   Tawni Millers, MD  [facility] 05/17/2021, 9:21 AM

## 2021-05-17 NOTE — Plan of Care (Signed)

## 2021-05-17 NOTE — Care Management Important Message (Signed)
Important Message  Patient Details  Name: Vincent [name] MRN: [id number] Date of Birth: 1950/11/21   Medicare Important Message Given:  Yes     Shelda Altes 05/17/2021, 8:15 AM

## 2021-05-18 LAB — CULTURE, BLOOD (ROUTINE X 2)
Culture: NO GROWTH
Culture: NO GROWTH
Special Requests: ADEQUATE

## 2021-06-08 DEATH — deceased
